# Patient Record
Sex: Male | Born: 1940
Health system: Southern US, Community
[De-identification: ages and names within clinical notes are randomized; demographics above are authoritative.]

## PROBLEM LIST (undated history)

## (undated) DIAGNOSIS — E119 Type 2 diabetes mellitus without complications: Secondary | ICD-10-CM

## (undated) DIAGNOSIS — N400 Enlarged prostate without lower urinary tract symptoms: Secondary | ICD-10-CM

## (undated) DIAGNOSIS — Z9889 Other specified postprocedural states: Secondary | ICD-10-CM

## (undated) DIAGNOSIS — R0600 Dyspnea, unspecified: Secondary | ICD-10-CM

## (undated) DIAGNOSIS — K2270 Barrett's esophagus without dysplasia: Secondary | ICD-10-CM

## (undated) DIAGNOSIS — I2510 Atherosclerotic heart disease of native coronary artery without angina pectoris: Secondary | ICD-10-CM

## (undated) DIAGNOSIS — Z87442 Personal history of urinary calculi: Secondary | ICD-10-CM

## (undated) DIAGNOSIS — I10 Essential (primary) hypertension: Secondary | ICD-10-CM

## (undated) DIAGNOSIS — I480 Paroxysmal atrial fibrillation: Secondary | ICD-10-CM

## (undated) DIAGNOSIS — K219 Gastro-esophageal reflux disease without esophagitis: Secondary | ICD-10-CM

## (undated) DIAGNOSIS — R42 Dizziness and giddiness: Secondary | ICD-10-CM

## (undated) DIAGNOSIS — K449 Diaphragmatic hernia without obstruction or gangrene: Secondary | ICD-10-CM

## (undated) HISTORY — DX: Atherosclerotic heart disease of native coronary artery without angina pectoris: I25.10

## (undated) HISTORY — DX: Type 2 diabetes mellitus without complications: E11.9

## (undated) HISTORY — DX: Other specified postprocedural states: Z98.890

## (undated) HISTORY — DX: Essential (primary) hypertension: I10

## (undated) HISTORY — DX: Diaphragmatic hernia without obstruction or gangrene: K44.9

## (undated) HISTORY — DX: Barrett's esophagus without dysplasia: K22.70

## (undated) HISTORY — DX: Gastro-esophageal reflux disease without esophagitis: K21.9

## (undated) HISTORY — DX: Benign prostatic hyperplasia without lower urinary tract symptoms: N40.0

## (undated) HISTORY — PX: ROTATOR CUFF REPAIR: SHX139

## (undated) HISTORY — DX: Paroxysmal atrial fibrillation: I48.0

## (undated) HISTORY — PX: CHOLECYSTECTOMY: SHX55

## (undated) HISTORY — DX: Dizziness and giddiness: R42

## (undated) HISTORY — PX: APPENDECTOMY: SHX54

## (undated) HISTORY — DX: Dyspnea, unspecified: R06.00

---

## 2001-06-06 ENCOUNTER — Ambulatory Visit (HOSPITAL_COMMUNITY): Admission: RE | Admit: 2001-06-06 | Discharge: 2001-06-06 | Payer: Self-pay | Admitting: Family Medicine

## 2001-06-06 ENCOUNTER — Encounter: Payer: Self-pay | Admitting: Family Medicine

## 2003-02-08 ENCOUNTER — Other Ambulatory Visit: Admission: RE | Admit: 2003-02-08 | Discharge: 2003-02-08 | Payer: Self-pay | Admitting: Dermatology

## 2003-03-12 ENCOUNTER — Ambulatory Visit (HOSPITAL_COMMUNITY): Admission: RE | Admit: 2003-03-12 | Discharge: 2003-03-12 | Payer: Self-pay | Admitting: Otolaryngology

## 2003-03-12 ENCOUNTER — Encounter: Payer: Self-pay | Admitting: Otolaryngology

## 2003-08-12 ENCOUNTER — Emergency Department (HOSPITAL_COMMUNITY): Admission: EM | Admit: 2003-08-12 | Discharge: 2003-08-13 | Payer: Self-pay | Admitting: Emergency Medicine

## 2003-08-13 ENCOUNTER — Encounter: Payer: Self-pay | Admitting: Emergency Medicine

## 2003-09-02 ENCOUNTER — Other Ambulatory Visit: Admission: RE | Admit: 2003-09-02 | Discharge: 2003-09-02 | Payer: Self-pay | Admitting: Dermatology

## 2003-09-30 ENCOUNTER — Ambulatory Visit (HOSPITAL_COMMUNITY): Admission: RE | Admit: 2003-09-30 | Discharge: 2003-09-30 | Payer: Self-pay | Admitting: Family Medicine

## 2003-10-04 ENCOUNTER — Ambulatory Visit (HOSPITAL_COMMUNITY): Admission: RE | Admit: 2003-10-04 | Discharge: 2003-10-04 | Payer: Self-pay | Admitting: Family Medicine

## 2003-10-08 ENCOUNTER — Ambulatory Visit (HOSPITAL_COMMUNITY): Admission: RE | Admit: 2003-10-08 | Discharge: 2003-10-08 | Payer: Self-pay | Admitting: Family Medicine

## 2003-12-20 ENCOUNTER — Other Ambulatory Visit: Admission: RE | Admit: 2003-12-20 | Discharge: 2003-12-20 | Payer: Self-pay | Admitting: Dermatology

## 2004-10-03 ENCOUNTER — Ambulatory Visit (HOSPITAL_COMMUNITY): Admission: RE | Admit: 2004-10-03 | Discharge: 2004-10-03 | Payer: Self-pay | Admitting: Family Medicine

## 2005-01-08 ENCOUNTER — Ambulatory Visit (HOSPITAL_COMMUNITY): Admission: RE | Admit: 2005-01-08 | Discharge: 2005-01-08 | Payer: Self-pay | Admitting: Internal Medicine

## 2005-11-19 DIAGNOSIS — Z9889 Other specified postprocedural states: Secondary | ICD-10-CM

## 2005-11-19 HISTORY — DX: Other specified postprocedural states: Z98.890

## 2006-01-09 ENCOUNTER — Ambulatory Visit (HOSPITAL_COMMUNITY): Admission: RE | Admit: 2006-01-09 | Discharge: 2006-01-09 | Payer: Self-pay | Admitting: Family Medicine

## 2006-02-05 ENCOUNTER — Ambulatory Visit: Payer: Self-pay | Admitting: Internal Medicine

## 2006-02-06 ENCOUNTER — Encounter (INDEPENDENT_AMBULATORY_CARE_PROVIDER_SITE_OTHER): Payer: Self-pay | Admitting: *Deleted

## 2006-02-06 ENCOUNTER — Ambulatory Visit: Payer: Self-pay | Admitting: Internal Medicine

## 2006-02-06 ENCOUNTER — Ambulatory Visit (HOSPITAL_COMMUNITY): Admission: RE | Admit: 2006-02-06 | Discharge: 2006-02-06 | Payer: Self-pay | Admitting: Internal Medicine

## 2006-02-06 HISTORY — PX: COLONOSCOPY: SHX174

## 2006-02-11 ENCOUNTER — Ambulatory Visit (HOSPITAL_COMMUNITY): Admission: RE | Admit: 2006-02-11 | Discharge: 2006-02-11 | Payer: Self-pay | Admitting: Internal Medicine

## 2006-04-04 ENCOUNTER — Ambulatory Visit: Payer: Self-pay | Admitting: Internal Medicine

## 2006-05-01 ENCOUNTER — Ambulatory Visit: Payer: Self-pay | Admitting: Internal Medicine

## 2006-12-24 ENCOUNTER — Ambulatory Visit: Payer: Self-pay | Admitting: Internal Medicine

## 2008-01-09 ENCOUNTER — Ambulatory Visit: Payer: Self-pay | Admitting: Internal Medicine

## 2008-03-31 ENCOUNTER — Ambulatory Visit (HOSPITAL_COMMUNITY): Admission: RE | Admit: 2008-03-31 | Discharge: 2008-03-31 | Payer: Self-pay | Admitting: Family Medicine

## 2008-12-06 ENCOUNTER — Ambulatory Visit (HOSPITAL_COMMUNITY): Admission: RE | Admit: 2008-12-06 | Discharge: 2008-12-06 | Payer: Self-pay | Admitting: Ophthalmology

## 2009-01-31 DIAGNOSIS — K2270 Barrett's esophagus without dysplasia: Secondary | ICD-10-CM

## 2009-01-31 DIAGNOSIS — K219 Gastro-esophageal reflux disease without esophagitis: Secondary | ICD-10-CM | POA: Insufficient documentation

## 2009-02-01 ENCOUNTER — Ambulatory Visit: Payer: Self-pay | Admitting: Internal Medicine

## 2009-02-03 ENCOUNTER — Encounter: Payer: Self-pay | Admitting: Internal Medicine

## 2009-04-19 ENCOUNTER — Encounter: Payer: Self-pay | Admitting: Gastroenterology

## 2010-02-09 ENCOUNTER — Encounter: Payer: Self-pay | Admitting: Gastroenterology

## 2010-05-01 ENCOUNTER — Emergency Department (HOSPITAL_COMMUNITY): Admission: EM | Admit: 2010-05-01 | Discharge: 2010-05-01 | Payer: Self-pay | Admitting: Emergency Medicine

## 2010-05-09 ENCOUNTER — Ambulatory Visit: Payer: Self-pay | Admitting: Cardiovascular Disease

## 2010-05-09 DIAGNOSIS — R079 Chest pain, unspecified: Secondary | ICD-10-CM

## 2010-05-09 DIAGNOSIS — R42 Dizziness and giddiness: Secondary | ICD-10-CM | POA: Insufficient documentation

## 2010-05-17 ENCOUNTER — Ambulatory Visit: Payer: Self-pay | Admitting: Cardiology

## 2010-05-17 ENCOUNTER — Encounter (HOSPITAL_COMMUNITY): Admission: RE | Admit: 2010-05-17 | Discharge: 2010-05-17 | Payer: Self-pay | Admitting: Cardiovascular Disease

## 2010-05-24 ENCOUNTER — Encounter: Payer: Self-pay | Admitting: Cardiovascular Disease

## 2010-08-19 DIAGNOSIS — Z9889 Other specified postprocedural states: Secondary | ICD-10-CM

## 2010-08-19 HISTORY — DX: Other specified postprocedural states: Z98.890

## 2010-08-25 ENCOUNTER — Ambulatory Visit: Payer: Self-pay | Admitting: Internal Medicine

## 2010-09-15 ENCOUNTER — Ambulatory Visit: Payer: Self-pay | Admitting: Internal Medicine

## 2010-09-15 ENCOUNTER — Ambulatory Visit (HOSPITAL_COMMUNITY): Admission: RE | Admit: 2010-09-15 | Discharge: 2010-09-15 | Payer: Self-pay | Admitting: Internal Medicine

## 2010-09-15 HISTORY — PX: ESOPHAGOGASTRODUODENOSCOPY: SHX1529

## 2010-09-25 ENCOUNTER — Encounter: Payer: Self-pay | Admitting: Internal Medicine

## 2010-10-16 ENCOUNTER — Encounter: Payer: Self-pay | Admitting: Urgent Care

## 2010-12-05 ENCOUNTER — Encounter: Payer: Self-pay | Admitting: Urgent Care

## 2010-12-19 NOTE — Letter (Signed)
Summary: [place] Treadmill (Nuc Med Stress)  [facility]  [address], [place] [address]   Phone: [phone]  Fax: [phone]    Nuclear Medicine 1-Day Stress Test Information Sheet  Re:     [name]   DOB:     [birth year] MRN:     [id number] Weight:  Appointment Date: Register at: Appointment Time: Referring MD:  ___Exercise Stress  __Adenosine   __Dobutamine  _X_Lexiscan  __Persantine   __Thallium  Urgency: ____1 (next day)   ____2 (one week)    ____3 (PRN)  Patient will receive Follow Up call with results: Patient needs follow-up appointment:  Instructions regarding medication:  How to prepare for your stress test: 1. DO NOT eat or dring 6 hours prior to your arrival time. This includes no caffeine (coffee, tea, sodas, chocolate) if you were instructed to take your medications, drink water with it. 2. DO NOT use any tobacco products for at leaset 8 hours prior to arrival. 3. DO NOT wear dresses or any clothing that may have metal clasps or buttons. 4. Wear short sleeve shirts, loose clothing, and comfortalbe walking shoes. 5. DO NOT use lotions, oils or powder on your chest before the test. 6. The test will take approximately 3-4 hours from the time you arrive until completion. 7. To register the day of the test, go to the Short Stay entrance at [facility]. 8. If you must cancel your test, call [phone] as soon as you are aware.  After you arrive for test:   When you arrive at [facility], you will go to Short Stay to be registered. They will then send you to Radiology to check in. The Nuclear Medicine Tech will get you and start an IV in your arm or hand. A small amount of a radioactive tracer will then be injected into your IV. This tracer will then have to circulate for 30-45 minutes. During this time you will wait in the waiting room and you will be able to drink something without caffeine. A series of pictures will be taken  of your heart follwoing this waiting period. After the 1st set of pictures you will go to the stress lab to get ready for your stress test. During the stress test, another small amount of a radioactive tracer will be injected through your IV. When the stress test is complete, there is a short rest period while your heart rate and blood pressure will be monitored. When this monitoring period is complete you will have another set of pictrues taken. (The same as the 1st set of pictures). These pictures are taken between 15 minutes and 1 hour after the stress test. The time depends on the type of stress test you had. Your doctor will inform you of your test results within 7 days after test.    The possibilities of certain changes are possible during the test. They include abnormal blood pressure and disorders of the heart. Side effects of persantine or adenosine can include flushing, chest pain, shortness of breath, stomach tightness, headache and light-headedness. These side effects usually do not last long and are self-resolving. Every effort will be made to keep you comfortable and to minimize complications by obtaining a medical history and by close observation during the test. Emergency equipment, medications, and trained personnel are available to deal with any unusual situation which may arise.  Please notify office at least 48 hours in advance if you are unable to keep  this appt.

## 2010-12-19 NOTE — Letter (Signed)
Summary: EGD ORDER  EGD ORDER   Imported By: Ave Filter 08/25/2010 09:48:53  _____________________________________________________________________  External Attachment:    Type:   Image     Comment:   External Document

## 2010-12-19 NOTE — Assessment & Plan Note (Signed)
Summary: POST ED VISIT ON 05/01/10/TG   Visit Type:  Follow-up Primary Provider:  Karleen Hampshire  CC:  no cardiology complaints.  History of Present Illness: [name] is seen today at the request of the [facility] ER Dr Adriana Simas for chest pain evaluated on 05/01/10.  Records and ECG reviewed.  Pain is atypical centering around left neck and shoulder.  He has seen Dr. Rennis Chris before for right rotator cuff surgery and needs left shoulder surgery.  The pain is associated with dizzyness that may be vagally mediated with no frank syncope.  He has no documented CAD and had a normal stress test "many" years ago.  He has had dizzy spells on Hytrin before and Dr Earlene Plater decreased his dose of Flomax recently but the medicine is not new.  He indicates that he does walking and yard work without pain.  However movement of the arms and neck does not make the pain worse  In ER ECG was normal, enzymes negative CXR normal.    Current Problems (verified): 1)  Chest Pain  (ICD-786.50) 2)  Gerd  (ICD-530.81) 3)  Barretts Esophagus  (ICD-530.85)  Current Medications (verified): 1)  Allegra 180 Mg Tabs (Fexofenadine Hcl) .... Take 1 Tablet By Mouth Once A Day 2)  Flomax 0.4 Mg Xr24h-Cap (Tamsulosin Hcl) .... Take 1 Tablet By Mouth Once A Day 3)  Tylenol .... As Needed 4)  Meclizine .... As Needed 5)  Pantoprazole Sodium 40 Mg Tbec (Pantoprazole Sodium) .... One By Mouth Daily  Allergies (verified): No Known Drug Allergies  Past History:  Past Medical History: Last updated: 02/14/09 Barretts Esophagus (short-segment), diagnosed in 2007 by Dr. Jena Gauss GERD, chronic Hypertension BPH Allergies Vertigo Colonoscopy in 2007 by Dr. Jena Gauss - unremarkable  Past Surgical History: Last updated: 02/14/2009 Appendectomy Cholecystectomy - 1997 Dr. Katrinka Blazing Rotator Cuff Repair-Right - 2006  Family History: Last updated: 2009-02-14 Father: Deceased, age 16, prostate cancer Mother: Deceased, CVA Siblings:  No FH of Colon  Cancer:  Social History: Last updated: 02/14/09 Marital Status: Married Children: Son Occupation: Retired from [employer], does yard work part-time Patient is a former smoker. Quit 15 years ago, 60 pack year history Alcohol Use - no Illicit Drug Use - no  Review of Systems       Denies fever, malais, weight loss, blurry vision, decreased visual acuity, cough, sputum, SOB, hemoptysis, pleuritic pain, palpitaitons, heartburn, abdominal pain, melena, lower extremity edema, claudication, or rash.   Vital Signs:  Patient profile:   70 year old male Weight:      209 pounds BMI:     29.25 Pulse rate:   65 / minute BP sitting:   107 / 65  (right arm)  Vitals Entered By: Dreama Saa, CNA (May 09, 2010 10:16 AM)  Physical Exam  General:  Affect appropriate Healthy:  appears stated age HEENT: normal Neck supple with no adenopathy JVP normal no bruits no thyromegaly Lungs clear with no wheezing and good diaphragmatic motion Heart:  S1/S2 no murmur,rub, gallop or click PMI normal Abdomen: benighn, BS positve, no tenderness, no AAA no bruit.  No HSM or HJR Distal pulses intact with no bruits No edema Neuro non-focal Skin warm and dry    Impression & Recommendations:  Problem # 1:  CHEST PAIN (ICD-786.50) Atypical with limited risk factors and normal ECG and normal ER evaluaiotn.  Lexiscan Myovue.  Unable to walk due to hip, knee pain and arthritis Orders: Nuclear Stress Test (Nuc Stress Test)  Problem # 2:  DIZZINESS (ICD-780.4) May be vagal related to pain.  Decreasing Flomax dose is appropriate.  Will see how clincal course goes and what Myovue looks like  Patient Instructions: 1)  Your physician recommends that you schedule a follow-up appointment in:  2)  Your physician recommends that you continue on your current medications as directed. Please refer to the Current Medication list given to you today. 3)  Your physician has requested that you have an  [employer]  For further information please visit [email or web address]  Please follow instruction sheet, as given.   EKG Report  Procedure date:  05/01/2010  Findings:      NSR 58 Normal ECG  Appended Document: f/u prn  Pt. to f/u as needed

## 2010-12-19 NOTE — Medication Information (Signed)
Summary: PROTONIX 40MG   PROTONIX 40MG    Imported By: Rexene Alberts 10/16/2010 10:29:35  _____________________________________________________________________  External Attachment:    Type:   Image     Comment:   External Document  Appended Document: PROTONIX 40MG     Prescriptions: PANTOPRAZOLE SODIUM 40 MG TBEC (PANTOPRAZOLE SODIUM) one by mouth two times a day for acid reflux  #62 x 5   Entered and Authorized by:   Joselyn Arrow FNP-BC   Signed by:   Joselyn Arrow FNP-BC on 10/16/2010   Method used:   Electronically to        [facility] (retail)       [address]       [place], [place]  [address]       Ph: [phone]       Fax: [phone]   RxID:   [id number]     Appended Document: PROTONIX 40MG  Rx was sent to [facility] pharmacy and should have been sent to [facility] pharmacy.  I called it to [facility] [place], pharmacy. I called Robbie Lis and spoke with [facility] and cancelled it.  Appended Document: PROTONIX 40MG  Thanks

## 2010-12-19 NOTE — Letter (Signed)
Summary: [name], agricultural at [facility]  [address], [place] [address]   Phone: [phone]  Fax: [phone]      May 24, 2010 MRN: [id number]   [name] [address] [place], [place]  [address]   Dear Mr. Totino,  Your test ordered by Selena Batten has been reviewed by your physician (or physician assistant) and was found to be normal or stable. Your physician (or physician assistant) felt no changes were needed at this time.  ____ Echocardiogram  __X__ Cardiac Stress Test  ____ Lab Work  ____ Peripheral vascular study of arms, legs or neck  ____ CT scan or X-ray  ____ Lung or Breathing test  ____ Other: Please continue on current medical treatment.  Thank you.   Charlton Haws, MD, F.A.C.C

## 2010-12-19 NOTE — Medication Information (Signed)
Summary: Tax adviser   Imported By: Ricard Dillon 02/09/2010 13:54:27  _____________________________________________________________________  External Attachment:    Type:   Image     Comment:   External Document  Appended Document: RX Folder - pantoprazole    Prescriptions: PANTOPRAZOLE SODIUM 40 MG TBEC (PANTOPRAZOLE SODIUM) one by mouth daily  #31 x 11   Entered and Authorized by:   Leanna Battles. Dixon Boos   Signed by:   Leanna Battles Dixon Boos on 02/10/2010   Method used:   Electronically to        [facility] (retail)       [address]       [place], [place]  [address]       Ph: [phone] or [phone]       Fax: [phone]   RxID:   [id number]

## 2010-12-19 NOTE — Assessment & Plan Note (Signed)
Summary: STOMACH PAIN/SS   Visit Type:  Follow-up Visit Primary Care Provider:  mcgough  Chief Complaint:  follow up- overdue for egd and no problems right now.  History of Present Illness: History of GERD and short segment Barrett's esophagus. He returns for followup. He's had some intermittent chest tightness associated with exertion. He seen Dr. Eden Emms .  Myoview recently negative. Not felt to have ischemic heart disease. No dysphagia. No melena or rectal bleeding. On pantoprazole 40 mg orally daily with  significant control her reflux although he does have breakthrough symptoms from time to time. We saw him last year and offered  him a surveillance EGD, however, he declined. However, he presents now to have that done.  Current Problems (verified): 1)  Dizziness  (ICD-780.4) 2)  Chest Pain  (ICD-786.50) 3)  Gerd  (ICD-530.81) 4)  Barretts Esophagus  (ICD-530.85)  Current Medications (verified): 1)  Allegra 180 Mg Tabs (Fexofenadine Hcl) .... Take 1 Tablet By Mouth Once A Day 2)  Flomax 0.4 Mg Xr24h-Cap (Tamsulosin Hcl) .... Take 1 Tablet By Mouth Once A Day 3)  Tylenol .... As Needed 4)  Meclizine .... As Needed 5)  Pantoprazole Sodium 40 Mg Tbec (Pantoprazole Sodium) .... One By Mouth Daily  Allergies (verified): No Known Drug Allergies  Past History:  Past Surgical History: Last updated: 2009/02/11 Appendectomy Cholecystectomy - 1997 Dr. Katrinka Blazing Rotator Cuff Repair-Right - 2006  Family History: Last updated: 02-11-09 Father: Deceased, age 81, prostate cancer Mother: Deceased, CVA Siblings:  No FH of Colon Cancer:  Social History: Last updated: 02-11-09 Marital Status: Married Children: Son Occupation: Retired from [employer], does yard work part-time Patient is a former smoker. Quit 15 years ago, 60 pack year history Alcohol Use - no Illicit Drug Use - no  Risk Factors: Smoking Status: quit (02/11/2009)  Past Medical History: Barretts  Esophagus (short-segment), diagnosed in 2007 by Dr. Jena Gauss GERD, chronic Hypertension BPH Allergies Vertigo Colonoscopy in 2007 by Dr. Jena Gauss - unremarkable prostate problems  Review of Systems       patient denies abdominal pain chest pain dyspnea on exertion no fever chills no yellow jaundice clinically she'll dark-colored urine. He denies constipation diarrhea he denies fever chills  Vital Signs:  Patient profile:   70 year old male Height:      71 inches Weight:      210 pounds BMI:     29.39 Temp:     97.6 degrees F oral Pulse rate:   60 / minute BP sitting:   138 / 90  (left arm) Cuff size:   regular  Vitals Entered By: Hendricks Limes LPN (August 25, 2010 9:05 AM)  Physical Exam  General:  pleasant alert gentleman no acute distress Eyes:  no sclerae. Conjunctivae are pink Lungs:  clear to auscultation Heart:  regular rate and rhythm without murmur gallop or a Abdomen:  flat positive bowel sounds soft nontender without appreciable mass or organomegaly  Impression & Recommendations: Impression: A 70 year old gentleman with GERD and known short segment Barrett's esophagus. He has occasional atypical chest symptoms. Symptoms not felt to be cardiac in origin. He certainly has reflux helped pantoprazole 40 mg orally daily. He somewhat overdue for surveillance EGD.  Recommendations: Surveillance EGD in the near future. Risks, benefits, limitations alternatives and upon which have been reviewed. His questions were answered;  prt agreeable. Between now and the time of EGD, i've asked this nice gentleman to increase his pantoprazole to 40 mg orally twice daily.  Routine screening colonoscopy 2017.  Further recommendations to follow.  Appended Document: Orders Update    Clinical Lists Changes  Orders: Added new Service order of Est. Patient Level III ([address]) - Signed

## 2010-12-19 NOTE — Letter (Signed)
Summary: Patient Notice, Endo Biopsy Results  [facility]  [address]   [place], [place] [address]   Phone: [phone]  Fax: [phone]       September 25, 2010   Harrington Dolloff [address] [place], [place]  [address] Mar 25, 1941    Dear Mr. Gunnarson,  I am pleased to inform you that the biopsies taken during your recent endoscopic examination did not show any evidence of cancer upon pathologic examination.  Biopsies did confirm Barrett's esophagus.  There was only mild inflammation in your stomach.  Additional information/recommendations:  Continue with the treatment plan as outlined on the day of your exam.  You should have a repeat endoscopic examination in 3 years.  Please call us if you are having persistent problems or have questions about your condition that have not been fully answered at this time.  Sincerely,    R. Roetta Sessions MD, FACP [facility] Ph: [phone]   Fax: [phone]   Appended Document: Patient Notice, Endo Biopsy Results letter mailed to pt  Appended Document: Patient Notice, Endo Biopsy Results reminder in computer

## 2010-12-21 NOTE — Medication Information (Signed)
Summary: PANTOPRAZOLE SODIUM 40MG   PANTOPRAZOLE SODIUM 40MG    Imported By: Rexene Alberts 12/05/2010 10:26:30  _____________________________________________________________________  External Attachment:    Type:   Image     Comment:   External Document  Appended Document: PANTOPRAZOLE SODIUM 40MG  5 RF given 11/11.  Pt should have some left @ [facility]  Appended Document: PANTOPRAZOLE SODIUM 40MG  informed pharmacy

## 2010-12-25 ENCOUNTER — Other Ambulatory Visit (HOSPITAL_COMMUNITY): Payer: Self-pay | Admitting: Family Medicine

## 2010-12-25 ENCOUNTER — Ambulatory Visit (HOSPITAL_COMMUNITY)
Admission: RE | Admit: 2010-12-25 | Discharge: 2010-12-25 | Disposition: A | Discharge status: Home / Self Care | Payer: Medicare Other | Source: Ambulatory Visit | Attending: Family Medicine | Admitting: Family Medicine

## 2010-12-25 DIAGNOSIS — R002 Palpitations: Secondary | ICD-10-CM | POA: Insufficient documentation

## 2010-12-25 DIAGNOSIS — R059 Cough, unspecified: Secondary | ICD-10-CM | POA: Insufficient documentation

## 2010-12-25 DIAGNOSIS — R05 Cough: Secondary | ICD-10-CM | POA: Insufficient documentation

## 2011-01-12 ENCOUNTER — Ambulatory Visit (INDEPENDENT_AMBULATORY_CARE_PROVIDER_SITE_OTHER): Payer: Medicare Other | Admitting: Urology

## 2011-01-12 DIAGNOSIS — N509 Disorder of male genital organs, unspecified: Secondary | ICD-10-CM

## 2011-01-12 DIAGNOSIS — N401 Enlarged prostate with lower urinary tract symptoms: Secondary | ICD-10-CM

## 2011-01-12 DIAGNOSIS — N411 Chronic prostatitis: Secondary | ICD-10-CM

## 2011-01-12 DIAGNOSIS — R972 Elevated prostate specific antigen [PSA]: Secondary | ICD-10-CM

## 2011-02-05 LAB — BASIC METABOLIC PANEL
BUN: 18 mg/dL (ref 6–23)
CO2: 24 mEq/L (ref 19–32)
Calcium: 9.1 mg/dL (ref 8.4–10.5)
Creatinine, Ser: 0.93 mg/dL (ref 0.4–1.5)
GFR calc non Af Amer: >60 mL/min (ref >60)
Glucose, Bld: 153 mg/dL — ABNORMAL HIGH (ref 70–99)
Sodium: 139 mEq/L (ref 135–145)

## 2011-02-05 LAB — DIFFERENTIAL
Basophils Absolute: 0 10*3/uL (ref 0.0–0.1)
Basophils Relative: 1 % (ref 0–1)
Lymphocytes Relative: 16 % (ref 12–46)
Monocytes Absolute: 0.4 10*3/uL (ref 0.1–1.0)
Neutro Abs: 4.5 10*3/uL (ref 1.7–7.7)

## 2011-02-05 LAB — D-DIMER, QUANTITATIVE: D-Dimer, Quant: 0.22 ug/mL-FEU (ref 0.00–0.48)

## 2011-02-05 LAB — CBC
Hemoglobin: 14.9 g/dL (ref 13.0–17.0)
MCHC: 34.7 g/dL (ref 30.0–36.0)
Platelets: 148 10*3/uL — ABNORMAL LOW (ref 150–400)
RDW: 13.5 % (ref 11.5–15.5)

## 2011-02-05 LAB — POCT CARDIAC MARKERS: Myoglobin, poc: 82 ng/mL (ref 12–200)

## 2011-03-05 LAB — BASIC METABOLIC PANEL
BUN: 14 mg/dL (ref 6–23)
CO2: 27 mEq/L (ref 19–32)
Calcium: 8.9 mg/dL (ref 8.4–10.5)
Glucose, Bld: 128 mg/dL — ABNORMAL HIGH (ref 70–99)
Potassium: 3.7 mEq/L (ref 3.5–5.1)
Sodium: 138 mEq/L (ref 135–145)

## 2011-03-22 ENCOUNTER — Encounter: Payer: Self-pay | Admitting: Internal Medicine

## 2011-04-03 NOTE — Assessment & Plan Note (Signed)
NAMEJAMMAL, [name]                 CHART#:  [id number]   DATE:  01/09/2008                       DOB:  06/12/1941   HISTORY OF PRESENT ILLNESS:  Followup gastroesophageal reflux disease,  short segment biopsy, and premature segment Barrett's esophagus. Last  seen December 24, 2006. Overall, he has done very well on Nexium 40 mg  daily (he was doing well on Prevacid but [place] asked [place] to consider  Nexium.) He is not having any dysphagia. Overall, he is doing well now.  He is on Medic here and wants a generic equivalent. He tells me he is  into doing it at home. He is no longer taking Reglan, which he was  taking previously. Last EGD was in 2007. He is due for surveillance in  2010. Last had colonoscopy in 2007 and would be due for routine  screening in 2017.   The only problem [name] has really been having recently is vertigo.  He does take Meclizine p.r.n. He has not seen [facility] folks  recently for that problem.   CURRENT MEDICATIONS:  See updated list.   ALLERGIES:  NO KNOWN DRUG ALLERGIES.   PHYSICAL EXAMINATION:  GENERAL:  A 70 year old gentleman in no acute  distress.  VITAL SIGNS:  Weight 215, height 5 foot 10.  Temperature 97.8. Blood  pressure 130/88, pulse 64.  SKIN:  Warm and dry.  CHEST:  Lungs clear to auscultation.  CARDIOVASCULAR:  Regular rate and rhythm. Without murmur, rub, or  gallop.  ABDOMEN:  Nondistended. Positive bowel sounds. Soft, nontender, without  appreciable mass or organomegaly.   ASSESSMENT:  Reflux symptoms, well controlled on Nexium. He would like a  generic equivalent. Will check with his pharmacy as far as benefit goes  for either pantoprazole or Omeprazole. Will go with whatever is the  cheapest for a month and he will let us know how he likes it. If he  cannot tell any difference, we will go with that agent and if it is not  as good, we  might try the other one adn go from there. Unless something comes up,  plan to see this  nice gentleman back in 1 year.       Jonathon Bellows, M.D.  Electronically Signed     RMR/MEDQ  D:  01/09/2008  T:  01/09/2008  Job:  [id number]   cc:   Kirk Ruths, M.D.

## 2011-04-06 NOTE — Consult Note (Signed)
NAME:  [name], [name]                    ACCOUNT NO.:  0   MEDICAL RECORD NO.:  [id number]           PATIENT TYPE:   LOCATION:                                 FACILITY:   PHYSICIAN:  R. Roetta Sessions, M.D. DATE OF BIRTH:  08-Jan-1941   DATE OF CONSULTATION:  02/05/2006  DATE OF DISCHARGE:                                   CONSULTATION   REFERRING PHYSICIAN:  Kirk Ruths, M.D.   REASON FOR CONSULTATION:  Severe epigastric pain x2 weeks.   HISTORY OF PRESENT ILLNESS:  [name] is a 70 year old, Caucasian male who  reports about 3 weeks ago he had two to three episodes of large volume, dark  stools which he felt to be melena.  He did afterwards complete three  Hemoccult cards which were reportedly negative.  He began to have severe  epigastric pain.  This has been fairly constant for the last 2 weeks.  Initially, the pain was 10/10 on pain scale.  He started taking Prilosec  once daily about 1 month ago.  Within the last week, he has taken it b.i.d.  This has helped his symptoms about 75%.  He has also cut out coffee and  fried foods from his diet.  He does note that bland foods make his symptoms  resolve at least for a moment of time.  He denies any nausea or vomiting.  He denies any dysphagia, odynophagia or regurgitation.  He has a long-  standing history of chronic GERD with nocturnal heartburn and water brash  for at least the last 5 years.  He has also noticed abdominal bloating and  increased belching.  He generally has a bowel movement once or twice a day.  He denies any further melena or rectal bleeding.   PAST MEDICAL HISTORY:  1.  Hypertension.  2.  GERD.  3.  BPH.  4.  Allergies.  5.  Appendectomy.  6.  Right rotator cuff repair in December 2006.  7.  Laparoscopic cholecystectomy in 1997, by Dr. Katrinka Blazing.   CURRENT MEDICATIONS:  1.  Allegra 180 mg daily.  2.  Flomax 0.4 mg daily.  3.  Prilosec 20 mg b.i.d.  4.  Tylenol p.r.n.   ALLERGIES:  No known drug  allergies.   FAMILY HISTORY:  No known family history of colorectal carcinoma or chronic  GI problems.  Mother deceased due to CVA.  Father deceased at age 76  secondary to prostate carcinoma.  He has one healthy brother.   SOCIAL HISTORY:  [name] has been in his second marriage x30 years.  He  has one son.  He is retired from the [employer].  He has a  IT sales professional.  He has a 60-pack-year tobacco use history  quitting 12 years ago.  Denies any alcohol or drug use.   REVIEW OF SYSTEMS:  CONSTITUTIONAL:  Weight is stable.  He does complain of  anorexia as he is afraid to eat.  Denies any fever or chills.  Complains  of some fatigue.  CARDIOVASCULAR:  Denies  any chest pain or palpitations.  PULMONARY:  Denies any shortness of breath, dyspnea, cough or hemoptysis.  GASTROINTESTINAL:  See HPI.   PHYSICAL EXAMINATION:  VITAL SIGNS:  Weight 213 pounds, height 68 inches,  temperature 98.3, blood pressure 122/70, pulse 94.  GENERAL:  [name] is a 70 year old, Caucasian male who is alert, pleasant,  oriented and cooperative in no acute distress.  HEENT:  Sclerae clear, nonicteric.  Conjunctivae pink.  Oropharynx pink and  moist without any lesions.  NECK:  Supple without mass or thyromegaly.  CHEST:  Heart regular rate and rhythm with normal S1, S2.  No murmurs, rubs  or gallops.  LUNGS:  Clear to auscultation bilaterally.  ABDOMEN:  Positive bowel sounds x4.  No bruits auscultated.  Nondistended.  He does have mild tenderness to epigastrium and bilateral lower quadrants on  deep palpation.  No rebound tenderness or guarding.  No hepatosplenomegaly  or mass.  RECTAL:  He does have a 1.5 cm to 2 cm, circumferential erythema to the  perirectal skin.  No evidence of hemorrhoids.  Internal exam is normal.  He  has a small amount of light brown stool which is Hemoccult negative.  EXTREMITIES:  Without clubbing or edema bilaterally.  SKIN:  Pink, warm and dry  without rash or jaundice.   ASSESSMENT:  [name] is a 70 year old, Caucasian male with a long-standing  history of chronic gastroesophageal reflux disease with symptoms including  nocturnal heartburn, indigestion and water brash.  Approximately 3 weeks  ago, he had three episodes of large volume dark stools.  He presented to his  primary care and apparently stools were Hemoccult negative.  At the same  time, he developed severe epigastric pain.  He is now taking two times a day  Prilosec which has resolved his symptoms about 75%.  He is going to need  further evaluation to assess his upper gastrointestinal tract, rule out  peptic ulcer disease.  He also needs colonoscopy to rule out diverticular  bleeding, etc.   RECOMMENDATIONS:  1.  Will check diverticular bleeding, colorectal carcinoma, etc.  He is      status post cholecystectomy.  I suspect most of his symptoms are due to      refractory GERD, erosive reflux esophagitis.  2.  Continue Prilosec 20 mg b.i.d.  3.  Check CBC, LFTs, amylase and lipase.  4.  I have offered pain medicines, however, he has declined.  5.  Will schedule EGD and colonoscopy in the near future.  I have discussed      both procedures including risks and benefits including, but not limited      to, bleeding, infection, perforation, drug reaction and consent will be      obtained.  6.  Further recommendations pending procedure.   I would like to thank Dr. Regino Schultze for allowing [place] to take part in the care  of this patient.      Nicholas Lose, N.P.      Jonathon Bellows, M.D.  Electronically Signed    KC/MEDQ  D:  02/05/2006  T:  02/05/2006  Job:  [id number]

## 2011-04-06 NOTE — Op Note (Signed)
[name], [name]                ACCOUNT NO.:  [id number]   MEDICAL RECORD NO.:  [id number]          PATIENT TYPE:  AMB   LOCATION:  DAY                           FACILITY:  [facility]   PHYSICIAN:  R. Roetta Sessions, M.D. DATE OF BIRTH:  May 24, 1941   DATE OF PROCEDURE:  02/06/2006  DATE OF DISCHARGE:                                 OPERATIVE REPORT   PROCEDURE:  Esophagogastroduodenoscopy with biopsy followed by colonoscopy,  screening.   INDICATIONS FOR PROCEDURE:  A 70 year old gentleman with chronic  gastroesophageal reflux disease. Had dark stools three weeks ago. Had some  epigastric pain. These symptoms have subsided. He is status post  cholecystectomy. He has not had any hematochezia. He has never had screening  colonoscopy. He significantly improved on b.i.d. Prilosec. Labs through the  office included a CBC, LFTs, amylase and lipase; all came back okay. EGD and  colonoscopy are now being done. This approach has been discussed with the  patient at length. Potential risks, benefits, and alternatives have been  reviewed and questions answered. He is agreeable. Please see documentation  in the medical record.   PROCEDURE NOTE:  O2 saturation, blood pressure, pulse, and respirations were  monitored throughout the entire procedure. Conscious sedation with Versed 8  mg IV and Demerol 150 mg IV in divided doses.   INSTRUMENT:  Olympus video chip system.   FINDINGS:  Esophagogastroduodenoscopy:  Examination of the tubular esophagus  revealed a patulous EG junction, a 3-cm patch of salmon-colored epithelium  coming up above the EG junction. Esophageal mucosa otherwise appeared  normal. There was no esophagitis. EG junction was easily traversed.   Stomach:  Gastric cavity was empty and insufflated well with air. There was  quite a bit of bile-stained mucus, otherwise. Thorough examination of  gastric mucosa including retroflexed view of the proximal stomach and  esophagogastric  junction demonstrated only a small hiatal hernia. Pylorus  patent and easily traversed. Examination of bulb and second portion revealed  no abnormalities.   THERAPEUTIC/DIAGNOSTIC MANEUVERS:  The tongue of salmon-colored epithelium  was biopsied for histological study. The patient tolerated the procedure  well and was prepared for colonoscopy. Digital rectal revealed no  abnormalities.   ENDOSCOPIC FINDINGS:  Prep was fair.   Rectum:  Examination of the rectal mucosa including retroflexed view of the  anal verge revealed only a single anal papilla.   Colon:  Colonic mucosa was surveyed from the rectosigmoid junction through  the left, transverse, and right colon to the area of the appendiceal  orifice, ileocecal valve, and cecum. These structures were well seen and  photographed for the record. From this level, the scope was slowly  withdrawn, and all previously mentioned mucosal surfaces were again seen.  The colonic mucosa appeared normal. The patient tolerated both procedures  well and was reactive to endoscopy.   IMPRESSION:  Esophagogastroduodenoscopy:  A 3-cm patch of salmon-colored  epithelium extended up from the EG junction suspicious for short-segment  Barrett's, biopsied. Patulous EG junction. Small hiatal hernia. Otherwise  normal stomach, normal D1 and D2.   Colonoscopy findings:  Single  anal papilla. Otherwise normal rectum. Normal  colon.   RECOMMENDATIONS:  1.  Stop Prilosec. Begin Prevacid 30 mg orally daily 30 minutes before      breakfast. Follow up on pathology.  2.  Further recommendations to follow.      Jonathon Bellows, M.D.  Electronically Signed     RMR/MEDQ  D:  02/06/2006  T:  02/07/2006  Job:  [id number]   cc:   Kirk Ruths, M.D.  Fax: [phone]

## 2011-04-20 ENCOUNTER — Ambulatory Visit (INDEPENDENT_AMBULATORY_CARE_PROVIDER_SITE_OTHER): Payer: Medicare Other | Admitting: Urology

## 2011-04-20 DIAGNOSIS — N509 Disorder of male genital organs, unspecified: Secondary | ICD-10-CM

## 2011-04-20 DIAGNOSIS — N401 Enlarged prostate with lower urinary tract symptoms: Secondary | ICD-10-CM

## 2011-05-14 ENCOUNTER — Other Ambulatory Visit: Payer: Self-pay

## 2011-05-14 MED ORDER — PANTOPRAZOLE SODIUM 40 MG PO TBEC: 40.0000 mg | DELAYED_RELEASE_TABLET | Freq: Two times a day (BID) | ORAL | Status: DC

## 2011-05-14 NOTE — Telephone Encounter (Signed)
Please schedule pt ov (for refills)

## 2011-05-14 NOTE — Telephone Encounter (Signed)
Needs OV with [place] if not already scheduled.

## 2011-05-14 NOTE — Telephone Encounter (Signed)
[facility] changed quantity to 62

## 2011-05-15 ENCOUNTER — Encounter: Payer: Self-pay | Admitting: Internal Medicine

## 2011-05-15 NOTE — Telephone Encounter (Signed)
Pt is aware of OV for 05/31/11 @ 0800 with [facility]

## 2011-05-25 ENCOUNTER — Ambulatory Visit: Payer: Medicare Other | Admitting: Urology

## 2011-05-31 ENCOUNTER — Encounter: Payer: Self-pay | Admitting: Gastroenterology

## 2011-05-31 ENCOUNTER — Ambulatory Visit (INDEPENDENT_AMBULATORY_CARE_PROVIDER_SITE_OTHER): Payer: Medicare Other | Admitting: Gastroenterology

## 2011-05-31 VITALS — BP 122/71 | HR 55 | Temp 97.6°F | Ht 71.0 in | Wt 211.6 lb

## 2011-05-31 DIAGNOSIS — K2270 Barrett's esophagus without dysplasia: Secondary | ICD-10-CM

## 2011-05-31 MED ORDER — PANTOPRAZOLE SODIUM 40 MG PO TBEC: 40.0000 mg | DELAYED_RELEASE_TABLET | Freq: Every day | ORAL | Status: DC

## 2011-05-31 NOTE — Assessment & Plan Note (Signed)
70 year old Caucasian male with hx of short segment Barrett's esophagus. Last surveillance in Oct 2011. Doing quite well now, maintained on Protonix once daily. No pain, N/V. No dysphagia. We will see this nice gentleman in 1 year. Refills for Protonix provided for 1 year as well. Next surveillance EGD in Oct 2014.

## 2011-05-31 NOTE — Patient Instructions (Signed)
Continue Protonix daily. Refills for one year have been sent to your pharmacy.  You will need another upper endoscopy in 2014.   We will see you back in one year.   Please call us if you have any problems in the interim.

## 2011-05-31 NOTE — Progress Notes (Signed)
Cc to PCP 

## 2011-05-31 NOTE — Progress Notes (Signed)
Referring Provider: No ref. provider found Primary Care Physician:  Kirk Ruths, MD Primary Gastroenterologist: Dr. Jena Gauss   Chief Complaint  Patient presents with  . Medication Refill    HPI:   [name] is a 70 year old pleasant Caucasian male who presents with hx of short segment Barrett's esophagus. Last surveillance EGD in Oct 2011; due for updated surveillance in 2014. Doing well at this time. He is here for further refills. He remains on Protonix once daily without any exacerbation of reflux. Denies dysphagia. No N/V. Denies lack of appetite or wt loss. Last colonoscopy unremarkable in 2007. No melena or brbpr.    Past Medical History  Diagnosis Date  . Barrett's esophagus     short-segment, diagnosed in 2007 by Dr. Jena Gauss  . GERD (gastroesophageal reflux disease)   . HTN (hypertension)   . BPH (benign prostatic hyperplasia)   . Vertigo   . S/P colonoscopy 2007    unremarkable  . S/P endoscopy Oct 2011     Salmon-colored epithelium coming up to 37 cm from the     Past Surgical History  Procedure Date  . Appendectomy   . Cholecystectomy   . Rotator cuff repair     Current Outpatient Prescriptions  Medication Sig Dispense Refill  . pantoprazole (PROTONIX) 40 MG tablet Take 1 tablet (40 mg total) by mouth daily.  31 tablet  11  . Tamsulosin HCl (FLOMAX) 0.4 MG CAPS         Allergies as of 05/31/2011  . (No Known Allergies)    Family History  Problem Relation Age of Onset  . Prostate cancer Father     deceased  . Stroke Mother     deceased  . Colon cancer Neg Hx     History   Social History  . Marital Status: Married    Spouse Name: N/A    Number of Children: N/A  . Years of Education: N/A   Social History Main Topics  . Smoking status: Former Smoker    Types: Cigarettes  . Smokeless tobacco: None   Comment: quit in the 90's  . Alcohol Use: No  . Drug Use: No  . Sexually Active: None   Other Topics Concern  . None   Social History  Narrative  . None    Review of Systems: Gen: Denies anorexia. Denies fatigue, weakness, weight loss.  CV: Denies chest pain, palpitations, syncope, peripheral edema, and claudication. Resp: Denies dyspnea at rest, cough, wheezing GI: Denies vomiting blood.  Denies dysphagia or odynophagia. Derm: Denies rash, itching, dry skin Psych: Denies depression, anxiety, memory loss, confusion. Heme: Denies bruising, bleeding, and enlarged lymph nodes.  Physical Exam: BP 122/71  Pulse 55  Temp(Src) 97.6 F (36.4 C) (Temporal)  Ht 5\' 11"  (1.803 m)  Wt 211 lb 9.6 oz (95.981 kg)  BMI 29.51 kg/m2 General:   Alert and oriented. No distress noted. Pleasant and cooperative.  Head:  Normocephalic and atraumatic. Eyes:  Conjuctiva clear without scleral icterus. Mouth:  Oral mucosa pink and moist. Good dentition. No lesions. Neck:  Supple, without mass or thyromegaly. No cervical adenopathy.  Heart:  S1, S2 present without murmurs, rubs, or gallops. Regular rate and rhythm. Abdomen:  +BS, soft, non-tender and non-distended. No rebound or guarding. No HSM or masses noted. Msk:  Symmetrical without gross deformities. Normal posture. Extremities:  Without edema. Neurologic:  Alert and  oriented x4;  grossly normal neurologically. Skin:  Intact without significant lesions or rashes. Cervical Nodes:  No  significant cervical adenopathy. Psych:  Alert and cooperative. Normal mood and affect.

## 2011-06-29 ENCOUNTER — Ambulatory Visit (INDEPENDENT_AMBULATORY_CARE_PROVIDER_SITE_OTHER): Payer: Medicare Other | Admitting: Urology

## 2011-06-29 DIAGNOSIS — N401 Enlarged prostate with lower urinary tract symptoms: Secondary | ICD-10-CM

## 2011-06-29 DIAGNOSIS — R972 Elevated prostate specific antigen [PSA]: Secondary | ICD-10-CM

## 2011-06-29 DIAGNOSIS — N509 Disorder of male genital organs, unspecified: Secondary | ICD-10-CM

## 2011-07-20 ENCOUNTER — Other Ambulatory Visit: Payer: Self-pay

## 2011-07-20 NOTE — Telephone Encounter (Signed)
11 refills sent to pharmacy in 7/12. What is the problem?

## 2011-07-24 ENCOUNTER — Telehealth: Payer: Self-pay

## 2011-07-24 NOTE — Telephone Encounter (Signed)
After review of chart, will keep pt on BID protonix. Prescription sent via fax for BID dosing, 11 refills.

## 2011-07-24 NOTE — Telephone Encounter (Signed)
Received Refill request from [facility] for refills for Pantoprazole bid for pt. He has refills for once a day. Per OV note on 05/31/2011 pt is just taking once a day. LMOM for pt to call to explain what is going on.

## 2011-07-24 NOTE — Telephone Encounter (Signed)
Per Gerrit Halls, NP, pt can have the Pantoprazole 40mg  bid. She signed order to fax to [facility] for #62 with 11 refills. I faxed.

## 2011-07-24 NOTE — Telephone Encounter (Signed)
Pt is aware the order has been faxed to [facility] for twice a day dosing.

## 2011-10-16 ENCOUNTER — Telehealth: Payer: Self-pay

## 2011-10-16 NOTE — Telephone Encounter (Signed)
Pt referred for colonoscopy. Records indicate he is not due one until 01/2016 for his next one. He said  He is not having any problems at this time, but will call if needed. I will fax the info to [facility] for their records.

## 2011-11-02 ENCOUNTER — Other Ambulatory Visit: Payer: Self-pay | Admitting: Urology

## 2011-11-02 ENCOUNTER — Ambulatory Visit (INDEPENDENT_AMBULATORY_CARE_PROVIDER_SITE_OTHER): Payer: Medicare Other | Admitting: Urology

## 2011-11-02 DIAGNOSIS — M545 Low back pain: Secondary | ICD-10-CM

## 2011-11-02 DIAGNOSIS — N411 Chronic prostatitis: Secondary | ICD-10-CM

## 2011-11-02 DIAGNOSIS — N509 Disorder of male genital organs, unspecified: Secondary | ICD-10-CM

## 2011-11-02 DIAGNOSIS — N401 Enlarged prostate with lower urinary tract symptoms: Secondary | ICD-10-CM

## 2011-11-08 ENCOUNTER — Ambulatory Visit (HOSPITAL_COMMUNITY)
Admission: RE | Admit: 2011-11-08 | Discharge: 2011-11-08 | Disposition: A | Discharge status: Home / Self Care | Payer: Medicare Other | Source: Ambulatory Visit | Attending: Urology | Admitting: Urology

## 2011-11-08 ENCOUNTER — Other Ambulatory Visit (HOSPITAL_COMMUNITY): Payer: Self-pay | Admitting: Urology

## 2011-11-08 DIAGNOSIS — M5126 Other intervertebral disc displacement, lumbar region: Secondary | ICD-10-CM | POA: Insufficient documentation

## 2011-11-08 DIAGNOSIS — N509 Disorder of male genital organs, unspecified: Secondary | ICD-10-CM

## 2011-11-08 DIAGNOSIS — M545 Low back pain, unspecified: Secondary | ICD-10-CM

## 2011-11-08 DIAGNOSIS — M47817 Spondylosis without myelopathy or radiculopathy, lumbosacral region: Secondary | ICD-10-CM | POA: Insufficient documentation

## 2011-11-08 MED ORDER — GADOBENATE DIMEGLUMINE 529 MG/ML IV SOLN
20.0000 mL | Freq: Once | INTRAVENOUS | Status: AC | PRN
  Administered 2011-11-08: 20 mL via INTRAVENOUS

## 2011-11-09 ENCOUNTER — Other Ambulatory Visit: Payer: Self-pay | Admitting: Urology

## 2011-11-09 DIAGNOSIS — N200 Calculus of kidney: Secondary | ICD-10-CM

## 2011-11-15 ENCOUNTER — Ambulatory Visit (HOSPITAL_COMMUNITY)
Admission: RE | Admit: 2011-11-15 | Discharge: 2011-11-15 | Disposition: A | Discharge status: Home / Self Care | Payer: Medicare Other | Source: Ambulatory Visit | Attending: Urology | Admitting: Urology

## 2011-11-15 DIAGNOSIS — R1032 Left lower quadrant pain: Secondary | ICD-10-CM | POA: Insufficient documentation

## 2011-11-15 DIAGNOSIS — N200 Calculus of kidney: Secondary | ICD-10-CM

## 2011-11-15 DIAGNOSIS — K5730 Diverticulosis of large intestine without perforation or abscess without bleeding: Secondary | ICD-10-CM | POA: Insufficient documentation

## 2011-11-15 DIAGNOSIS — Q619 Cystic kidney disease, unspecified: Secondary | ICD-10-CM | POA: Insufficient documentation

## 2011-12-14 ENCOUNTER — Ambulatory Visit (INDEPENDENT_AMBULATORY_CARE_PROVIDER_SITE_OTHER): Payer: Medicare Other | Admitting: Urology

## 2011-12-14 DIAGNOSIS — N509 Disorder of male genital organs, unspecified: Secondary | ICD-10-CM | POA: Diagnosis not present

## 2011-12-14 DIAGNOSIS — N401 Enlarged prostate with lower urinary tract symptoms: Secondary | ICD-10-CM | POA: Diagnosis not present

## 2012-06-10 ENCOUNTER — Encounter: Payer: Self-pay | Admitting: Internal Medicine

## 2012-06-10 ENCOUNTER — Ambulatory Visit (INDEPENDENT_AMBULATORY_CARE_PROVIDER_SITE_OTHER): Payer: Medicare Other | Admitting: Internal Medicine

## 2012-06-10 VITALS — BP 126/65 | HR 57 | Temp 98.5°F | Ht 71.0 in | Wt 217.0 lb

## 2012-06-10 DIAGNOSIS — K2270 Barrett's esophagus without dysplasia: Secondary | ICD-10-CM | POA: Diagnosis not present

## 2012-06-10 DIAGNOSIS — K219 Gastro-esophageal reflux disease without esophagitis: Secondary | ICD-10-CM

## 2012-06-10 NOTE — Progress Notes (Signed)
Primary Care Physician:  Kirk Ruths, MD Primary Gastroenterologist:  Dr. Jena Gauss  Pre-Procedure History & Physical: HPI:  [name] is a 71 y.o. male here for followup GERD/short segment Barrett's esophagus. Biopsies demonstrated short segment Barrett's without dysplasia. He is due for surveillance EGD in 2014. No dysphagia no melena no early satiety nausea or vomiting. He is somewhat overweight. He has not had any intercurrent medical problems/surgeries, except for since last being seen here. He had a normal colonoscopy in 2007.  Past Medical History  Diagnosis Date  . Barrett's esophagus     short-segment, diagnosed in 2007 by Dr. Jena Gauss  . GERD (gastroesophageal reflux disease)   . HTN (hypertension)   . BPH (benign prostatic hyperplasia)   . Vertigo   . S/P colonoscopy 2007    unremarkable  . S/P endoscopy Oct 2011     Salmon-colored epithelium coming up to 37 cm from the   . Hiatal hernia     small    Past Surgical History  Procedure Date  . Appendectomy   . Cholecystectomy   . Rotator cuff repair   . Esophagogastroduodenoscopy 09/15/2010    Dr. Jena Gauss- Salmon-colored epithelium coming up to 37 cm from the   . Colonoscopy 02/06/2006    Dr. Jena Gauss- single anal papilla, o/w normal rectum, normal colon    Prior to Admission medications   Medication Sig Start Date End Date Taking? Authorizing Provider  amitriptyline (ELAVIL) 10 MG tablet Take 10 mg by mouth at bedtime.  06/05/12  Yes Historical Provider, MD  pantoprazole (PROTONIX) 40 MG tablet Take 40 mg by mouth 2 (two) times daily.   Yes Nira Retort, NP  Tamsulosin HCl (FLOMAX) 0.4 MG CAPS Take 0.4 mg by mouth daily.  05/10/11  Yes Historical Provider, MD    Allergies as of 06/10/2012  . (No Known Allergies)    Family History  Problem Relation Age of Onset  . Prostate cancer Father     deceased  . Stroke Mother     deceased  . Colon cancer Neg Hx     History   Social History  . Marital Status: Married      Spouse Name: N/A    Number of Children: N/A  . Years of Education: N/A   Occupational History  . Not on file.   Social History Main Topics  . Smoking status: Former Smoker    Types: Cigarettes  . Smokeless tobacco: Not on file   Comment: quit in the 90's  . Alcohol Use: No  . Drug Use: No  . Sexually Active: Not on file   Other Topics Concern  . Not on file   Social History Narrative  . No narrative on file    Review of Systems: See HPI, otherwise negative ROS  Physical Exam: BP 126/65  Pulse 57  Temp 98.5 F (36.9 C) (Temporal)  Ht 5\' 11"  (1.803 m)  Wt 217 lb (98.431 kg)  BMI 30.27 kg/m2 General:   Alert,  Well-developed, well-nourished, pleasant and cooperative in NAD Skin:  Intact without significant lesions or rashes. Eyes:  Sclera clear, no icterus.   Conjunctiva pink. Ears:  Normal auditory acuity. Nose:  No deformity, discharge,  or lesions. Mouth:  No deformity or lesions. Neck:  Supple; no masses or thyromegaly. No significant cervical adenopathy. Lungs:  Clear throughout to auscultation.   No wheezes, crackles, or rhonchi. No acute distress. Heart:  Regular rate and rhythm; no murmurs, clicks, rubs,  or gallops. Abdomen:  Non-distended, normal bowel sounds.  Soft and nontender without appreciable mass or hepatosplenomegaly.  Pulses:  Normal pulses noted. Extremities:  Without clubbing or edema.  Impression/Plan:  History of GERD and short segment Barrett's esophagus. Doing well on twice a day Protonix. He is a little over weight. No alarm symptoms. He is due for surveillance EGD next year.  Average risk screening colonoscopy in 2017.  Recommendations:  Continue Protonix; I suggest we drop back to 40 mg once daily to see if he can tolerate this regimen. If not, he can go back up to a twice a day regimen. I am asking loose 10 pounds this year. Long-term risk and benefits of acid suppression therapy reviewed. In this setting, I feel the benefits outweigh the  risks.

## 2012-06-10 NOTE — Patient Instructions (Addendum)
Continue Protonix;  May try taking it just once daily.  GERD information.  Loose 10 pounds  Offfice visit in 1 year to set up repeat EGD

## 2012-06-13 ENCOUNTER — Ambulatory Visit (INDEPENDENT_AMBULATORY_CARE_PROVIDER_SITE_OTHER): Payer: Medicare Other | Admitting: Urology

## 2012-06-13 DIAGNOSIS — N401 Enlarged prostate with lower urinary tract symptoms: Secondary | ICD-10-CM

## 2012-06-13 DIAGNOSIS — N509 Disorder of male genital organs, unspecified: Secondary | ICD-10-CM

## 2012-06-13 DIAGNOSIS — R972 Elevated prostate specific antigen [PSA]: Secondary | ICD-10-CM | POA: Diagnosis not present

## 2012-06-13 DIAGNOSIS — N138 Other obstructive and reflux uropathy: Secondary | ICD-10-CM | POA: Diagnosis not present

## 2012-06-13 DIAGNOSIS — N529 Male erectile dysfunction, unspecified: Secondary | ICD-10-CM | POA: Diagnosis not present

## 2012-06-24 DIAGNOSIS — M779 Enthesopathy, unspecified: Secondary | ICD-10-CM | POA: Diagnosis not present

## 2012-06-24 DIAGNOSIS — J069 Acute upper respiratory infection, unspecified: Secondary | ICD-10-CM | POA: Diagnosis not present

## 2012-06-24 DIAGNOSIS — Z6829 Body mass index (BMI) 29.0-29.9, adult: Secondary | ICD-10-CM | POA: Diagnosis not present

## 2012-06-27 NOTE — Progress Notes (Signed)
Reminder in epic to follow up with RMR in one year

## 2012-08-11 ENCOUNTER — Other Ambulatory Visit: Payer: Self-pay

## 2012-08-11 MED ORDER — PANTOPRAZOLE SODIUM 40 MG PO TBEC: 40.0000 mg | DELAYED_RELEASE_TABLET | Freq: Two times a day (BID) | ORAL | Status: DC

## 2012-09-24 DIAGNOSIS — N509 Disorder of male genital organs, unspecified: Secondary | ICD-10-CM | POA: Diagnosis not present

## 2012-09-24 DIAGNOSIS — Z23 Encounter for immunization: Secondary | ICD-10-CM | POA: Diagnosis not present

## 2012-09-24 DIAGNOSIS — R319 Hematuria, unspecified: Secondary | ICD-10-CM | POA: Diagnosis not present

## 2012-09-24 DIAGNOSIS — N453 Epididymo-orchitis: Secondary | ICD-10-CM | POA: Diagnosis not present

## 2012-09-26 ENCOUNTER — Other Ambulatory Visit (HOSPITAL_COMMUNITY): Payer: Self-pay | Admitting: Physician Assistant

## 2012-09-26 DIAGNOSIS — R102 Pelvic and perineal pain: Secondary | ICD-10-CM

## 2012-09-26 DIAGNOSIS — N453 Epididymo-orchitis: Secondary | ICD-10-CM

## 2012-09-26 DIAGNOSIS — N509 Disorder of male genital organs, unspecified: Secondary | ICD-10-CM

## 2012-09-29 ENCOUNTER — Ambulatory Visit (HOSPITAL_COMMUNITY)
Admission: RE | Admit: 2012-09-29 | Discharge: 2012-09-29 | Disposition: A | Discharge status: Home / Self Care | Payer: Medicare Other | Source: Ambulatory Visit | Attending: Physician Assistant | Admitting: Physician Assistant

## 2012-09-29 DIAGNOSIS — K5730 Diverticulosis of large intestine without perforation or abscess without bleeding: Secondary | ICD-10-CM | POA: Diagnosis not present

## 2012-09-29 DIAGNOSIS — N509 Disorder of male genital organs, unspecified: Secondary | ICD-10-CM | POA: Diagnosis not present

## 2012-09-29 DIAGNOSIS — N200 Calculus of kidney: Secondary | ICD-10-CM | POA: Diagnosis not present

## 2012-09-29 DIAGNOSIS — R102 Pelvic and perineal pain: Secondary | ICD-10-CM

## 2012-09-29 DIAGNOSIS — R109 Unspecified abdominal pain: Secondary | ICD-10-CM | POA: Diagnosis not present

## 2012-09-29 DIAGNOSIS — N453 Epididymo-orchitis: Secondary | ICD-10-CM

## 2012-09-30 DIAGNOSIS — Z0000 Encounter for general adult medical examination without abnormal findings: Secondary | ICD-10-CM | POA: Diagnosis not present

## 2012-10-02 DIAGNOSIS — Z961 Presence of intraocular lens: Secondary | ICD-10-CM | POA: Diagnosis not present

## 2013-03-18 DIAGNOSIS — Z125 Encounter for screening for malignant neoplasm of prostate: Secondary | ICD-10-CM | POA: Diagnosis not present

## 2013-03-18 DIAGNOSIS — Z79899 Other long term (current) drug therapy: Secondary | ICD-10-CM | POA: Diagnosis not present

## 2013-03-18 DIAGNOSIS — F411 Generalized anxiety disorder: Secondary | ICD-10-CM | POA: Diagnosis not present

## 2013-03-18 DIAGNOSIS — Z6829 Body mass index (BMI) 29.0-29.9, adult: Secondary | ICD-10-CM | POA: Diagnosis not present

## 2013-04-06 DIAGNOSIS — M5382 Other specified dorsopathies, cervical region: Secondary | ICD-10-CM | POA: Diagnosis not present

## 2013-04-06 DIAGNOSIS — J029 Acute pharyngitis, unspecified: Secondary | ICD-10-CM | POA: Diagnosis not present

## 2013-04-06 DIAGNOSIS — Z6829 Body mass index (BMI) 29.0-29.9, adult: Secondary | ICD-10-CM | POA: Diagnosis not present

## 2013-04-14 DIAGNOSIS — Z6829 Body mass index (BMI) 29.0-29.9, adult: Secondary | ICD-10-CM | POA: Diagnosis not present

## 2013-04-14 DIAGNOSIS — K219 Gastro-esophageal reflux disease without esophagitis: Secondary | ICD-10-CM | POA: Diagnosis not present

## 2013-04-14 DIAGNOSIS — J019 Acute sinusitis, unspecified: Secondary | ICD-10-CM | POA: Diagnosis not present

## 2013-04-24 ENCOUNTER — Ambulatory Visit (INDEPENDENT_AMBULATORY_CARE_PROVIDER_SITE_OTHER): Payer: Medicare Other | Admitting: Urology

## 2013-04-24 DIAGNOSIS — N401 Enlarged prostate with lower urinary tract symptoms: Secondary | ICD-10-CM

## 2013-04-24 DIAGNOSIS — R972 Elevated prostate specific antigen [PSA]: Secondary | ICD-10-CM

## 2013-04-24 DIAGNOSIS — N529 Male erectile dysfunction, unspecified: Secondary | ICD-10-CM | POA: Diagnosis not present

## 2013-04-24 DIAGNOSIS — N509 Disorder of male genital organs, unspecified: Secondary | ICD-10-CM | POA: Diagnosis not present

## 2013-06-16 ENCOUNTER — Ambulatory Visit (INDEPENDENT_AMBULATORY_CARE_PROVIDER_SITE_OTHER): Payer: Medicare Other | Admitting: Internal Medicine

## 2013-06-16 ENCOUNTER — Encounter: Payer: Self-pay | Admitting: Internal Medicine

## 2013-06-16 VITALS — BP 98/65 | HR 67 | Temp 97.9°F | Ht 71.0 in | Wt 214.4 lb

## 2013-06-16 DIAGNOSIS — K2270 Barrett's esophagus without dysplasia: Secondary | ICD-10-CM | POA: Diagnosis not present

## 2013-06-16 DIAGNOSIS — K219 Gastro-esophageal reflux disease without esophagitis: Secondary | ICD-10-CM | POA: Diagnosis not present

## 2013-06-16 NOTE — Progress Notes (Signed)
Primary Care Physician:  Kirk Ruths, MD Primary Gastroenterologist:  Dr. Jena Gauss  Pre-Procedure History & Physical: HPI:  [name] is a 72 y.o. male here for followup of Barrett's esophagus/GERD. Last EGD demonstrated  short segment Barrett's in 2011. He's done well on Protonix. He does requires 40 mg twice daily to control symptoms. No dysphagia, early satiety or nausea/vomiting. No melena or hematochezia. He's lost 3 pounds since he was last seen.  Past Medical History  Diagnosis Date  . Barrett's esophagus     short-segment, diagnosed in 2007 by Dr. Jena Gauss  . GERD (gastroesophageal reflux disease)   . HTN (hypertension)   . BPH (benign prostatic hyperplasia)   . Vertigo   . S/P colonoscopy 2007    unremarkable  . S/P endoscopy Oct 2011     Salmon-colored epithelium coming up to 37 cm from the   . Hiatal hernia     small    Past Surgical History  Procedure Laterality Date  . Appendectomy    . Cholecystectomy    . Rotator cuff repair    . Esophagogastroduodenoscopy  09/15/2010    Dr. Jena Gauss- Salmon-colored epithelium coming up to 37 cm from the   . Colonoscopy  02/06/2006    Dr. Jena Gauss- single anal papilla, o/w normal rectum, normal colon    Prior to Admission medications   Medication Sig Start Date End Date Taking? Authorizing Provider  amitriptyline (ELAVIL) 10 MG tablet Take 10 mg by mouth at bedtime.  06/05/12  Yes Historical Provider, MD  pantoprazole (PROTONIX) 40 MG tablet Take 1 tablet (40 mg total) by mouth 2 (two) times daily. 08/11/12  Yes Nira Retort, NP  Tamsulosin HCl (FLOMAX) 0.4 MG CAPS Take 0.4 mg by mouth daily.  05/10/11  Yes Historical Provider, MD    Allergies as of 06/16/2013  . (No Known Allergies)    Family History  Problem Relation Age of Onset  . Prostate cancer Father     deceased  . Stroke Mother     deceased  . Colon cancer Neg Hx     History   Social History  . Marital Status: Married    Spouse Name: N/A    Number of  Children: N/A  . Years of Education: N/A   Occupational History  . Not on file.   Social History Main Topics  . Smoking status: Former Smoker    Types: Cigarettes  . Smokeless tobacco: Not on file     Comment: quit in the 90's  . Alcohol Use: No  . Drug Use: No  . Sexually Active: Not on file   Other Topics Concern  . Not on file   Social History Narrative  . No narrative on file    Review of Systems: See HPI, otherwise negative ROS  Physical Exam: BP 98/65  Pulse 67  Temp(Src) 97.9 F (36.6 C) (Oral)  Ht 5\' 11"  (1.803 m)  Wt 214 lb 6.4 oz (97.251 kg)  BMI 29.92 kg/m2 General:   Alert,  Well-developed, well-nourished, pleasant and cooperative in NAD Skin:  Intact without significant lesions or rashes. Eyes:  Sclera clear, no icterus.   Conjunctiva pink. Ears:  Normal auditory acuity. Nose:  No deformity, discharge,  or lesions. Mouth:  No deformity or lesions. Neck:  Supple; no masses or thyromegaly. No significant cervical adenopathy. Lungs:  Clear throughout to auscultation.   No wheezes, crackles, or rhonchi. No acute distress. Heart:  Regular rate and rhythm; no murmurs, clicks, rubs,  or gallops. Abdomen: Non-distended, normal bowel sounds.  Soft and nontender without appreciable mass or hepatosplenomegaly.  Pulses:  Normal pulses noted. Extremities:  Without clubbing or edema.  Impression/Plan:  Pleasant 72 year old gentleman with short segment Barrett's esophagus. He is due for surveillance EGD with biopsies. Reflux symptoms well controlled on twice daily PPI.  If he could lose additional weight, he may be able to get by with once daily therapy  Recommendations:  Proceed with EGD with surveillance biopsies.The risks, benefits, limitations, alternatives and imponderables have been reviewed with the patient. Potential for esophageal dilation, biopsy, etc. have also been reviewed.  Questions have been answered. All parties agreeable.

## 2013-06-16 NOTE — Patient Instructions (Addendum)
Schedule EGD to follow-up Barrett's esophagus  Continue Protonix 40 mg twice daily

## 2013-06-19 ENCOUNTER — Encounter (HOSPITAL_COMMUNITY): Payer: Self-pay

## 2013-07-02 ENCOUNTER — Encounter (HOSPITAL_COMMUNITY): Payer: Self-pay | Admitting: *Deleted

## 2013-07-02 ENCOUNTER — Ambulatory Visit (HOSPITAL_COMMUNITY)
Admission: RE | Admit: 2013-07-02 | Discharge: 2013-07-02 | Disposition: A | Discharge status: Home / Self Care | Payer: Medicare Other | Source: Ambulatory Visit | Attending: Internal Medicine | Admitting: Internal Medicine

## 2013-07-02 ENCOUNTER — Encounter (HOSPITAL_COMMUNITY)
Admission: RE | Disposition: A | Discharge status: Home / Self Care | Payer: Self-pay | Source: Ambulatory Visit | Attending: Internal Medicine

## 2013-07-02 DIAGNOSIS — I10 Essential (primary) hypertension: Secondary | ICD-10-CM | POA: Insufficient documentation

## 2013-07-02 DIAGNOSIS — K2270 Barrett's esophagus without dysplasia: Secondary | ICD-10-CM | POA: Diagnosis not present

## 2013-07-02 DIAGNOSIS — K449 Diaphragmatic hernia without obstruction or gangrene: Secondary | ICD-10-CM

## 2013-07-02 HISTORY — PX: ESOPHAGOGASTRODUODENOSCOPY: SHX5428

## 2013-07-02 SURGERY — EGD (ESOPHAGOGASTRODUODENOSCOPY)
Anesthesia: Moderate Sedation

## 2013-07-02 MED ORDER — ONDANSETRON HCL 4 MG/2ML IJ SOLN
INTRAMUSCULAR | Status: DC | PRN
  Administered 2013-07-02: 4 mg via INTRAVENOUS

## 2013-07-02 MED ORDER — ONDANSETRON HCL 4 MG/2ML IJ SOLN
INTRAMUSCULAR | Status: AC
  Filled 2013-07-02: qty 2

## 2013-07-02 MED ORDER — SODIUM CHLORIDE 0.9 % IV SOLN
INTRAVENOUS | Status: DC
  Administered 2013-07-02: 1000 mL via INTRAVENOUS

## 2013-07-02 MED ORDER — MEPERIDINE HCL 100 MG/ML IJ SOLN
INTRAMUSCULAR | Status: DC | PRN
  Administered 2013-07-02 (×2): 25 mg via INTRAVENOUS
  Administered 2013-07-02: 50 mg via INTRAVENOUS

## 2013-07-02 MED ORDER — MEPERIDINE HCL 100 MG/ML IJ SOLN
INTRAMUSCULAR | Status: AC
  Filled 2013-07-02: qty 1

## 2013-07-02 MED ORDER — MIDAZOLAM HCL 5 MG/5ML IJ SOLN
INTRAMUSCULAR | Status: AC
  Filled 2013-07-02: qty 10

## 2013-07-02 MED ORDER — STERILE WATER FOR IRRIGATION IR SOLN
Status: DC | PRN
  Administered 2013-07-02: 10:00:00

## 2013-07-02 MED ORDER — MIDAZOLAM HCL 5 MG/5ML IJ SOLN
INTRAMUSCULAR | Status: DC | PRN
  Administered 2013-07-02 (×2): 2 mg via INTRAVENOUS
  Administered 2013-07-02: 1 mg via INTRAVENOUS

## 2013-07-02 MED ORDER — BUTAMBEN-TETRACAINE-BENZOCAINE 2-2-14 % EX AERO
INHALATION_SPRAY | CUTANEOUS | Status: DC | PRN
  Administered 2013-07-02: 2 via TOPICAL

## 2013-07-02 MED ORDER — SODIUM CHLORIDE 0.9 % IV SOLN
Freq: Once | INTRAVENOUS | Status: AC
  Administered 2013-07-02: 12:00:00 via INTRAVENOUS

## 2013-07-02 NOTE — Op Note (Signed)
[facility] [address] [place], [address]   ENDOSCOPY PROCEDURE REPORT  PATIENT: [name], [name]  MR#: [id number] BIRTHDATE: March 12, 1941 , 72  yrs. old GENDER: Male ENDOSCOPIST: R.  Roetta Sessions, MD FACP FACG REFERRED BY:  Karleen Hampshire, M.D. PROCEDURE DATE:  07/02/2013 PROCEDURE:     EGD with esophageal  INDICATIONS:     History short segment Barrett's esophagus; surveillance examination  INFORMED CONSENT:   The risks, benefits, limitations, alternatives and imponderables have been discussed.  The potential for biopsy, esophogeal dilation, etc. have also been reviewed.  Questions have been answered.  All parties agreeable.  Please see the history and physical in the medical record for more information.  MEDICATIONS:   Versed 5 mg IV and Demerol 100 mg IV in divided doses. Zofran 4 mg IV. Cetacaine spray.  DESCRIPTION OF PROCEDURE:   The EG-[id number] ([id number])  endoscope was introduced through the mouth and advanced to the second portion of the duodenum without difficulty or limitations.  The mucosal surfaces were surveyed very carefully during advancement of the scope and upon withdrawal.  Retroflexion view of the proximal stomach and esophagogastric junction was performed.      FINDINGS:  Salmon-colored epithelium coming up 2 cm from the EG junction (39-37 cm from incisors). There waas no esophagitis. No tumor. Stomach empty. Small hiatal hernia. Normal gastric mucosa. Patent pylorus. Normal first and second portion of the duodenum  THERAPEUTIC / DIAGNOSTIC MANEUVERS PERFORMED:  Biopsies of the abnormal distal esophageal mucosa were taken for histologic study   COMPLICATIONS:  None  IMPRESSION:  Short segment Barrett's esophagus-status post biopsy. Hiatal hernia.  RECOMMENDATIONS:  Continue Protonix. Further recommendations to follow pending review of pathology report .    _______________________________ R. Roetta Sessions, MD FACP  [facility] eSigned:  R. Roetta Sessions, MD FACP [facility] 07/02/2013 10:19 AM     CC:

## 2013-07-02 NOTE — Interval H&P Note (Signed)
History and Physical Interval Note:  07/02/2013 9:50 AM  [name]  has presented today for surgery, with the diagnosis of Barretts Esophagus  The various methods of treatment have been discussed with the patient and family. After consideration of risks, benefits and other options for treatment, the patient has consented to  Procedure(s) with comments: ESOPHAGOGASTRODUODENOSCOPY (EGD) (N/A) - 9:30-moved to 9:45 [name] to notify pt as a surgical intervention .  The patient's history has been reviewed, patient examined, no change in status, stable for surgery.  I have reviewed the patient's chart and labs.  Questions were answered to the patient's satisfaction.     No change. EGD per plan.The risks, benefits, limitations, alternatives and imponderables have been reviewed with the patient. Potential for esophageal dilation, biopsy, etc. have also been reviewed.  Questions have been answered. All parties agreeable.   Eula Listen

## 2013-07-02 NOTE — Progress Notes (Signed)
Pt's heart rate in pre-op has stayed between 39-48. BP has stayed around 95/55. Pt is alert, awake and oriented. No complaints of dizziness or lightheadedness. Dr. Jena Gauss was notified. Pt's endo nurse, Brain Hilts, RN said the pt's heartrate fluctuated between 45-55 during procedure. Dr. Jena Gauss ordered for pt to have 500cc of NS and to stay in post op for another 30 minutes. Will continue to monitor.

## 2013-07-02 NOTE — H&P (View-Only) (Signed)
Primary Care Physician:  MCGOUGH,WILLIAM M, MD Primary Gastroenterologist:  Dr. Sherilynn Dieu  Pre-Procedure History & Physical: HPI:  [name] is a 72 y.o. male here for followup of Barrett's esophagus/GERD. Last EGD demonstrated  short segment Barrett's in 2011. He's done well on Protonix. He does requires 40 mg twice daily to control symptoms. No dysphagia, early satiety or nausea/vomiting. No melena or hematochezia. He's lost 3 pounds since he was last seen.  Past Medical History  Diagnosis Date  . Barrett's esophagus     short-segment, diagnosed in 2007 by Dr. Ivor Kishi  . GERD (gastroesophageal reflux disease)   . HTN (hypertension)   . BPH (benign prostatic hyperplasia)   . Vertigo   . S/P colonoscopy 2007    unremarkable  . S/P endoscopy Oct 2011     Salmon-colored epithelium coming up to 37 cm from the   . Hiatal hernia     small    Past Surgical History  Procedure Laterality Date  . Appendectomy    . Cholecystectomy    . Rotator cuff repair    . Esophagogastroduodenoscopy  09/15/2010    Dr. Ltanya Bayley- Salmon-colored epithelium coming up to 37 cm from the   . Colonoscopy  02/06/2006    Dr. Adreanna Fickel- single anal papilla, o/w normal rectum, normal colon    Prior to Admission medications   Medication Sig Start Date End Date Taking? Authorizing Provider  amitriptyline (ELAVIL) 10 MG tablet Take 10 mg by mouth at bedtime.  06/05/12  Yes Historical Provider, MD  pantoprazole (PROTONIX) 40 MG tablet Take 1 tablet (40 mg total) by mouth 2 (two) times daily. 08/11/12  Yes Anna W Sams, NP  Tamsulosin HCl (FLOMAX) 0.4 MG CAPS Take 0.4 mg by mouth daily.  05/10/11  Yes Historical Provider, MD    Allergies as of 06/16/2013  . (No Known Allergies)    Family History  Problem Relation Age of Onset  . Prostate cancer Father     deceased  . Stroke Mother     deceased  . Colon cancer Neg Hx     History   Social History  . Marital Status: Married    Spouse Name: N/A    Number of  Children: N/A  . Years of Education: N/A   Occupational History  . Not on file.   Social History Main Topics  . Smoking status: Former Smoker    Types: Cigarettes  . Smokeless tobacco: Not on file     Comment: quit in the 90's  . Alcohol Use: No  . Drug Use: No  . Sexually Active: Not on file   Other Topics Concern  . Not on file   Social History Narrative  . No narrative on file    Review of Systems: See HPI, otherwise negative ROS  Physical Exam: BP 98/65  Pulse 67  Temp(Src) 97.9 F (36.6 C) (Oral)  Ht 5' 11" (1.803 m)  Wt 214 lb 6.4 oz (97.251 kg)  BMI 29.92 kg/m2 General:   Alert,  Well-developed, well-nourished, pleasant and cooperative in NAD Skin:  Intact without significant lesions or rashes. Eyes:  Sclera clear, no icterus.   Conjunctiva pink. Ears:  Normal auditory acuity. Nose:  No deformity, discharge,  or lesions. Mouth:  No deformity or lesions. Neck:  Supple; no masses or thyromegaly. No significant cervical adenopathy. Lungs:  Clear throughout to auscultation.   No wheezes, crackles, or rhonchi. No acute distress. Heart:  Regular rate and rhythm; no murmurs, clicks, rubs,    or gallops. Abdomen: Non-distended, normal bowel sounds.  Soft and nontender without appreciable mass or hepatosplenomegaly.  Pulses:  Normal pulses noted. Extremities:  Without clubbing or edema.  Impression/Plan:  Pleasant 72-year-old gentleman with short segment Barrett's esophagus. He is due for surveillance EGD with biopsies. Reflux symptoms well controlled on twice daily PPI.  If he could lose additional weight, he may be able to get by with once daily therapy  Recommendations:  Proceed with EGD with surveillance biopsies.The risks, benefits, limitations, alternatives and imponderables have been reviewed with the patient. Potential for esophageal dilation, biopsy, etc. have also been reviewed.  Questions have been answered. All parties agreeable. 

## 2013-07-03 ENCOUNTER — Encounter: Payer: Self-pay | Admitting: Internal Medicine

## 2013-07-06 ENCOUNTER — Encounter (HOSPITAL_COMMUNITY): Payer: Self-pay | Admitting: Internal Medicine

## 2013-08-17 ENCOUNTER — Other Ambulatory Visit: Payer: Self-pay

## 2013-08-17 MED ORDER — PANTOPRAZOLE SODIUM 40 MG PO TBEC: 40.0000 mg | DELAYED_RELEASE_TABLET | Freq: Two times a day (BID) | ORAL | Status: DC

## 2013-09-10 ENCOUNTER — Encounter: Payer: Self-pay | Admitting: Internal Medicine

## 2013-09-29 ENCOUNTER — Ambulatory Visit: Payer: Medicare Other | Admitting: Internal Medicine

## 2013-10-01 DIAGNOSIS — H1045 Other chronic allergic conjunctivitis: Secondary | ICD-10-CM | POA: Diagnosis not present

## 2013-10-05 DIAGNOSIS — Z23 Encounter for immunization: Secondary | ICD-10-CM | POA: Diagnosis not present

## 2013-10-13 ENCOUNTER — Encounter: Payer: Self-pay | Admitting: Internal Medicine

## 2013-10-13 ENCOUNTER — Ambulatory Visit (INDEPENDENT_AMBULATORY_CARE_PROVIDER_SITE_OTHER): Payer: Medicare Other | Admitting: Internal Medicine

## 2013-10-13 VITALS — BP 116/68 | HR 63 | Temp 98.0°F | Wt 213.8 lb

## 2013-10-13 DIAGNOSIS — K2270 Barrett's esophagus without dysplasia: Secondary | ICD-10-CM | POA: Diagnosis not present

## 2013-10-13 DIAGNOSIS — K219 Gastro-esophageal reflux disease without esophagitis: Secondary | ICD-10-CM | POA: Diagnosis not present

## 2013-10-13 NOTE — Patient Instructions (Signed)
Continue Protonix daily  Loose 10 pounds in next 6 months  GERD information  Plan for repeat EGD and Colonoscopy in 3 years  Office visit in 1 year

## 2013-10-13 NOTE — Progress Notes (Signed)
Primary Care Physician:  Kirk Ruths, MD Primary Gastroenterologist:  Dr. Jena Gauss  Pre-Procedure History & Physical: HPI:  [name] is a 72 y.o. male here for followup of GERD/Barrett's esophagus. He's done well on Protonix 40 mg twice daily. Occasionally, he does have nocturnal reflux when he over eats late at night. No dysphagia. Barrett's biopsies without dysplasia earlier this year. He will be due for surveillance EGD and a screening colonoscopy in 2017. He continues to be significantly over his ideal body weight.  Past Medical History  Diagnosis Date  . Barrett's esophagus     short-segment, diagnosed in 2007 by Dr. Jena Gauss  . GERD (gastroesophageal reflux disease)   . HTN (hypertension)   . BPH (benign prostatic hyperplasia)   . Vertigo   . S/P colonoscopy 2007    unremarkable  . S/P endoscopy Oct 2011     Salmon-colored epithelium coming up to 37 cm from the   . Hiatal hernia     small    Past Surgical History  Procedure Laterality Date  . Appendectomy    . Cholecystectomy    . Rotator cuff repair    . Esophagogastroduodenoscopy  09/15/2010    Dr. Jena Gauss- Salmon-colored epithelium coming up to 37 cm from the   . Colonoscopy  02/06/2006    Dr. Jena Gauss- single anal papilla, o/w normal rectum, normal colon  . Esophagogastroduodenoscopy N/A 07/02/2013    Dr. Jena Gauss- Barretts on bx. hiatal hernia    Prior to Admission medications   Medication Sig Start Date End Date Taking? Authorizing Provider  amitriptyline (ELAVIL) 10 MG tablet Take 5 mg by mouth daily as needed. depression 06/05/12  Yes Historical Provider, MD  Fexofenadine HCl (ALLEGRA PO) Take 1 tablet by mouth daily.   Yes Historical Provider, MD  ibuprofen (ADVIL,MOTRIN) 200 MG tablet Take 600 mg by mouth daily as needed for pain.   Yes Historical Provider, MD  pantoprazole (PROTONIX) 40 MG tablet Take 1 tablet (40 mg total) by mouth 2 (two) times daily. 08/17/13  Yes Nira Retort, NP  Tamsulosin HCl (FLOMAX) 0.4 MG  CAPS Take 0.4 mg by mouth 2 (two) times daily.  05/10/11  Yes Historical Provider, MD    Allergies as of 10/13/2013  . (No Known Allergies)    Family History  Problem Relation Age of Onset  . Prostate cancer Father     deceased  . Stroke Mother     deceased  . Colon cancer Neg Hx     History   Social History  . Marital Status: Married    Spouse Name: N/A    Number of Children: N/A  . Years of Education: N/A   Occupational History  . Not on file.   Social History Main Topics  . Smoking status: Former Smoker -- 1.50 packs/day for 30 years    Types: Cigarettes    Quit date: 11/18/1993  . Smokeless tobacco: Not on file     Comment: quit in the 90's  . Alcohol Use: No  . Drug Use: No  . Sexual Activity: Not on file   Other Topics Concern  . Not on file   Social History Narrative  . No narrative on file    Review of Systems: See HPI, otherwise negative ROS  Physical Exam: BP 116/68  Pulse 63  Temp(Src) 98 F (36.7 C) (Oral)  Wt 213 lb 12.8 oz (96.979 kg) General:   Alert,  Well-developed, well-nourished, pleasant and cooperative in NAD Skin:  Intact without  significant lesions or rashes. Eyes:  Sclera clear, no icterus.   Conjunctiva pink. Ears:  Normal auditory acuity. Nose:  No deformity, discharge,  or lesions. Mouth:  No deformity or lesions. Neck:  Supple; no masses or thyromegaly. No significant cervical adenopathy. Lungs:  Clear throughout to auscultation.   No wheezes, crackles, or rhonchi. No acute distress. Heart:  Regular rate and rhythm; no murmurs, clicks, rubs,  or gallops. Abdomen: Non-distended, normal bowel sounds.  Soft and nontender without appreciable mass or hepatosplenomegaly.  Pulses:  Normal pulses noted. Extremities:  Without clubbing or edema.  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  Recommendations:  Discussed the multipronged approach to GERD. He is to continue Protonix twice a day. I feel the benefits outweigh the risks. I have asked him to plan on losing 10 pounds over the next 6 months. Literature on GERD provided.  Office visit here in one year.

## 2013-10-23 DIAGNOSIS — R42 Dizziness and giddiness: Secondary | ICD-10-CM | POA: Diagnosis not present

## 2013-10-23 DIAGNOSIS — J069 Acute upper respiratory infection, unspecified: Secondary | ICD-10-CM | POA: Diagnosis not present

## 2013-10-23 DIAGNOSIS — Z6829 Body mass index (BMI) 29.0-29.9, adult: Secondary | ICD-10-CM | POA: Diagnosis not present

## 2013-12-08 DIAGNOSIS — H538 Other visual disturbances: Secondary | ICD-10-CM | POA: Diagnosis not present

## 2013-12-08 DIAGNOSIS — E119 Type 2 diabetes mellitus without complications: Secondary | ICD-10-CM | POA: Diagnosis not present

## 2013-12-08 DIAGNOSIS — Z961 Presence of intraocular lens: Secondary | ICD-10-CM | POA: Diagnosis not present

## 2013-12-08 DIAGNOSIS — H04129 Dry eye syndrome of unspecified lacrimal gland: Secondary | ICD-10-CM | POA: Diagnosis not present

## 2013-12-11 DIAGNOSIS — Z6828 Body mass index (BMI) 28.0-28.9, adult: Secondary | ICD-10-CM | POA: Diagnosis not present

## 2013-12-11 DIAGNOSIS — N400 Enlarged prostate without lower urinary tract symptoms: Secondary | ICD-10-CM | POA: Diagnosis not present

## 2013-12-11 DIAGNOSIS — K219 Gastro-esophageal reflux disease without esophagitis: Secondary | ICD-10-CM | POA: Diagnosis not present

## 2013-12-22 ENCOUNTER — Telehealth (HOSPITAL_COMMUNITY): Payer: Self-pay | Admitting: *Deleted

## 2013-12-23 ENCOUNTER — Other Ambulatory Visit (HOSPITAL_COMMUNITY): Payer: Self-pay | Admitting: Family Medicine

## 2013-12-23 DIAGNOSIS — I739 Peripheral vascular disease, unspecified: Secondary | ICD-10-CM

## 2013-12-24 DIAGNOSIS — Z6828 Body mass index (BMI) 28.0-28.9, adult: Secondary | ICD-10-CM | POA: Diagnosis not present

## 2013-12-24 DIAGNOSIS — J111 Influenza due to unidentified influenza virus with other respiratory manifestations: Secondary | ICD-10-CM | POA: Diagnosis not present

## 2013-12-24 DIAGNOSIS — R51 Headache: Secondary | ICD-10-CM | POA: Diagnosis not present

## 2013-12-24 DIAGNOSIS — J069 Acute upper respiratory infection, unspecified: Secondary | ICD-10-CM | POA: Diagnosis not present

## 2013-12-25 ENCOUNTER — Ambulatory Visit (HOSPITAL_COMMUNITY)
Admission: RE | Admit: 2013-12-25 | Discharge: 2013-12-25 | Disposition: A | Discharge status: Home / Self Care | Payer: Medicare Other | Source: Ambulatory Visit | Attending: Cardiovascular Disease | Admitting: Cardiovascular Disease

## 2013-12-25 DIAGNOSIS — R209 Unspecified disturbances of skin sensation: Secondary | ICD-10-CM | POA: Diagnosis not present

## 2013-12-25 DIAGNOSIS — I739 Peripheral vascular disease, unspecified: Secondary | ICD-10-CM

## 2013-12-25 DIAGNOSIS — I70219 Atherosclerosis of native arteries of extremities with intermittent claudication, unspecified extremity: Secondary | ICD-10-CM

## 2013-12-25 NOTE — Progress Notes (Signed)
Arterial Duplex Lower Ext. Completed. [name], BS, RDMS, RVT  

## 2013-12-28 ENCOUNTER — Other Ambulatory Visit: Payer: Self-pay | Admitting: Gastroenterology

## 2014-01-27 DIAGNOSIS — Z23 Encounter for immunization: Secondary | ICD-10-CM | POA: Diagnosis not present

## 2014-01-27 DIAGNOSIS — N400 Enlarged prostate without lower urinary tract symptoms: Secondary | ICD-10-CM | POA: Diagnosis not present

## 2014-01-27 DIAGNOSIS — Z6828 Body mass index (BMI) 28.0-28.9, adult: Secondary | ICD-10-CM | POA: Diagnosis not present

## 2014-01-27 DIAGNOSIS — Z0000 Encounter for general adult medical examination without abnormal findings: Secondary | ICD-10-CM | POA: Diagnosis not present

## 2014-01-27 DIAGNOSIS — K219 Gastro-esophageal reflux disease without esophagitis: Secondary | ICD-10-CM | POA: Diagnosis not present

## 2014-03-11 ENCOUNTER — Ambulatory Visit (INDEPENDENT_AMBULATORY_CARE_PROVIDER_SITE_OTHER): Payer: Medicare Other | Admitting: Otolaryngology

## 2014-03-11 DIAGNOSIS — H6120 Impacted cerumen, unspecified ear: Secondary | ICD-10-CM

## 2014-03-11 DIAGNOSIS — H903 Sensorineural hearing loss, bilateral: Secondary | ICD-10-CM

## 2014-03-11 DIAGNOSIS — H9319 Tinnitus, unspecified ear: Secondary | ICD-10-CM | POA: Diagnosis not present

## 2014-03-22 DIAGNOSIS — Z6828 Body mass index (BMI) 28.0-28.9, adult: Secondary | ICD-10-CM | POA: Diagnosis not present

## 2014-03-22 DIAGNOSIS — M5430 Sciatica, unspecified side: Secondary | ICD-10-CM | POA: Diagnosis not present

## 2014-03-22 DIAGNOSIS — N400 Enlarged prostate without lower urinary tract symptoms: Secondary | ICD-10-CM | POA: Diagnosis not present

## 2014-03-27 ENCOUNTER — Emergency Department (HOSPITAL_COMMUNITY): Payer: Medicare Other

## 2014-03-27 ENCOUNTER — Emergency Department (HOSPITAL_COMMUNITY)
Admission: EM | Admit: 2014-03-27 | Discharge: 2014-03-27 | Disposition: A | Discharge status: Home / Self Care | Payer: Medicare Other | Attending: Emergency Medicine | Admitting: Emergency Medicine

## 2014-03-27 ENCOUNTER — Encounter (HOSPITAL_COMMUNITY): Payer: Self-pay | Admitting: Emergency Medicine

## 2014-03-27 DIAGNOSIS — Z23 Encounter for immunization: Secondary | ICD-10-CM | POA: Diagnosis not present

## 2014-03-27 DIAGNOSIS — N453 Epididymo-orchitis: Secondary | ICD-10-CM

## 2014-03-27 DIAGNOSIS — N200 Calculus of kidney: Secondary | ICD-10-CM | POA: Diagnosis not present

## 2014-03-27 DIAGNOSIS — Z9089 Acquired absence of other organs: Secondary | ICD-10-CM | POA: Diagnosis not present

## 2014-03-27 DIAGNOSIS — Z87891 Personal history of nicotine dependence: Secondary | ICD-10-CM | POA: Insufficient documentation

## 2014-03-27 DIAGNOSIS — Z6825 Body mass index (BMI) 25.0-25.9, adult: Secondary | ICD-10-CM | POA: Diagnosis not present

## 2014-03-27 DIAGNOSIS — R350 Frequency of micturition: Secondary | ICD-10-CM | POA: Diagnosis not present

## 2014-03-27 DIAGNOSIS — K219 Gastro-esophageal reflux disease without esophagitis: Secondary | ICD-10-CM | POA: Diagnosis not present

## 2014-03-27 DIAGNOSIS — N452 Orchitis: Secondary | ICD-10-CM | POA: Diagnosis not present

## 2014-03-27 DIAGNOSIS — R110 Nausea: Secondary | ICD-10-CM | POA: Diagnosis not present

## 2014-03-27 DIAGNOSIS — Z79899 Other long term (current) drug therapy: Secondary | ICD-10-CM | POA: Diagnosis not present

## 2014-03-27 DIAGNOSIS — I10 Essential (primary) hypertension: Secondary | ICD-10-CM | POA: Diagnosis not present

## 2014-03-27 DIAGNOSIS — R3915 Urgency of urination: Secondary | ICD-10-CM | POA: Insufficient documentation

## 2014-03-27 DIAGNOSIS — N201 Calculus of ureter: Secondary | ICD-10-CM | POA: Diagnosis not present

## 2014-03-27 LAB — CBC WITH DIFFERENTIAL/PLATELET
BASOS ABS: 0 10*3/uL (ref 0.0–0.1)
BASOS PCT: 0 % (ref 0–1)
EOS ABS: 0.1 10*3/uL (ref 0.0–0.7)
Eosinophils Relative: 1 % (ref 0–5)
HCT: 43.2 % (ref 39.0–52.0)
Hemoglobin: 15.4 g/dL (ref 13.0–17.0)
Lymphocytes Relative: 20 % (ref 12–46)
Lymphs Abs: 1.6 10*3/uL (ref 0.7–4.0)
MCH: 32.4 pg (ref 26.0–34.0)
MCHC: 35.6 g/dL (ref 30.0–36.0)
MCV: 90.9 fL (ref 78.0–100.0)
Monocytes Absolute: 0.8 10*3/uL (ref 0.1–1.0)
Monocytes Relative: 10 % (ref 3–12)
NEUTROS PCT: 69 % (ref 43–77)
Neutro Abs: 5.6 10*3/uL (ref 1.7–7.7)
PLATELETS: 176 10*3/uL (ref 150–400)
RBC: 4.75 MIL/uL (ref 4.22–5.81)
RDW: 12.9 % (ref 11.5–15.5)
WBC: 8.1 10*3/uL (ref 4.0–10.5)

## 2014-03-27 LAB — COMPREHENSIVE METABOLIC PANEL
ALBUMIN: 4 g/dL (ref 3.5–5.2)
ALK PHOS: 56 U/L (ref 39–117)
ALT: 16 U/L (ref 0–53)
AST: 15 U/L (ref 0–37)
BUN: 17 mg/dL (ref 6–23)
CO2: 26 mEq/L (ref 19–32)
Calcium: 9.3 mg/dL (ref 8.4–10.5)
Chloride: 105 mEq/L (ref 96–112)
Creatinine, Ser: 1.02 mg/dL (ref 0.50–1.35)
GFR calc Af Amer: 83 mL/min — ABNORMAL LOW (ref >90)
GFR calc non Af Amer: 71 mL/min — ABNORMAL LOW (ref >90)
Glucose, Bld: 110 mg/dL — ABNORMAL HIGH (ref 70–99)
POTASSIUM: 3.6 meq/L — AB (ref 3.7–5.3)
SODIUM: 143 meq/L (ref 137–147)
TOTAL PROTEIN: 6.7 g/dL (ref 6.0–8.3)
Total Bilirubin: 0.7 mg/dL (ref 0.3–1.2)

## 2014-03-27 LAB — URINALYSIS, ROUTINE W REFLEX MICROSCOPIC
BILIRUBIN URINE: NEGATIVE
Glucose, UA: NEGATIVE mg/dL
Hgb urine dipstick: NEGATIVE
KETONES UR: NEGATIVE mg/dL
Leukocytes, UA: NEGATIVE
NITRITE: NEGATIVE
Protein, ur: NEGATIVE mg/dL
UROBILINOGEN UA: 0.2 mg/dL (ref 0.0–1.0)
pH: 6 (ref 5.0–8.0)

## 2014-03-27 MED ORDER — FLEET ENEMA 7-19 GM/118ML RE ENEM
1.0000 | ENEMA | Freq: Once | RECTAL | Status: AC
  Administered 2014-03-27: 1 via RECTAL

## 2014-03-27 MED ORDER — SODIUM CHLORIDE 0.9 % IJ SOLN
INTRAMUSCULAR | Status: AC
  Administered 2014-03-27: 10:00:00
  Filled 2014-03-27: qty 500

## 2014-03-27 MED ORDER — OXYCODONE-ACETAMINOPHEN 5-325 MG PO TABS: 1.0000 | ORAL_TABLET | ORAL | Status: DC | PRN

## 2014-03-27 MED ORDER — KETOROLAC TROMETHAMINE 30 MG/ML IJ SOLN
15.0000 mg | Freq: Once | INTRAMUSCULAR | Status: AC
  Administered 2014-03-27: 15 mg via INTRAVENOUS
  Filled 2014-03-27: qty 1

## 2014-03-27 MED ORDER — HYDROMORPHONE HCL PF 1 MG/ML IJ SOLN
1.0000 mg | Freq: Once | INTRAMUSCULAR | Status: AC
  Administered 2014-03-27: 1 mg via INTRAVENOUS
  Filled 2014-03-27: qty 1

## 2014-03-27 MED ORDER — SULFAMETHOXAZOLE-TMP DS 800-160 MG PO TABS: 1.0000 | ORAL_TABLET | Freq: Two times a day (BID) | ORAL | Status: DC

## 2014-03-27 MED ORDER — PROMETHAZINE HCL 25 MG/ML IJ SOLN
12.5000 mg | Freq: Once | INTRAMUSCULAR | Status: AC
  Administered 2014-03-27: 12.5 mg via INTRAVENOUS
  Filled 2014-03-27: qty 1

## 2014-03-27 MED ORDER — ONDANSETRON HCL 4 MG/2ML IJ SOLN
4.0000 mg | Freq: Once | INTRAMUSCULAR | Status: AC
  Administered 2014-03-27: 4 mg via INTRAVENOUS
  Filled 2014-03-27: qty 2

## 2014-03-27 MED ORDER — SODIUM CHLORIDE 0.9 % IV SOLN
INTRAVENOUS | Status: DC
  Administered 2014-03-27: 09:00:00 via INTRAVENOUS

## 2014-03-27 NOTE — Discharge Instructions (Signed)
Drink plenty of fluids. Use Colace and MiraLax for constipation. Call the urologist for a followup appointment in 5-6 days.   Epididymitis Epididymitis is a swelling (inflammation) of the epididymis. The epididymis is a cord-like structure along the back part of the testicle. Epididymitis is usually, but not always, caused by infection. This is usually a sudden problem beginning with chills, fever and pain behind the scrotum and in the testicle. There may be swelling and redness of the testicle. DIAGNOSIS  Physical examination will reveal a tender, swollen epididymis. Sometimes, cultures are obtained from the urine or from prostate secretions to help find out if there is an infection or if the cause is a different problem. Sometimes, blood work is performed to see if your white blood cell count is elevated and if a germ (bacterial) or viral infection is present. Using this knowledge, an appropriate medicine which kills germs (antibiotic) can be chosen by your caregiver. A viral infection causing epididymitis will most often go away (resolve) without treatment. HOME CARE INSTRUCTIONS   Hot sitz baths for 20 minutes, 4 times per day, may help relieve pain.  Only take over-the-counter or prescription medicines for pain, discomfort or fever as directed by your caregiver.  Take all medicines, including antibiotics, as directed. Take the antibiotics for the full prescribed length of time even if you are feeling better.  It is very important to keep all follow-up appointments. SEEK IMMEDIATE MEDICAL CARE IF:   You have a fever.  You have pain not relieved with medicines.  You have any worsening of your problems.  Your pain seems to come and go.  You develop pain, redness, and swelling in the scrotum and surrounding areas. MAKE SURE YOU:   Understand these instructions.  Will watch your condition.  Will get help right away if you are not doing well or get worse. Document Released:  11/02/2000 Document Revised: 01/28/2012 Document Reviewed: 09/22/2009 [facility] Patient Information 2014 [place], [place].

## 2014-03-27 NOTE — ED Notes (Signed)
MD at bedside. 

## 2014-03-27 NOTE — ED Notes (Signed)
Moderate results from 750cc soap suds enema.

## 2014-03-27 NOTE — ED Notes (Signed)
PT d/c to home NAD.

## 2014-03-27 NOTE — ED Provider Notes (Signed)
  Face-to-face evaluation   History: He has had left flank pain with urinary urgency and frequency for several weeks, and has been treated with antibiotics, without improvement. His PCP thought that he had prostatitis. He has never had kidney stones. He does not have chronic back pain. In emergency department, he has been treated with IV analgesia, without relief. He continues to have a sensation of urinary urgency, without hesitancy or dysuria. He has abdominal pain that radiates to the left testicle, and occurs in a colicky fashion. He denies fever, chills, back, pain, weakness, or dizziness. No leg pain, with walking. He has had decreased stooling, and feels constipated and bloated for several days.  Physical exam: Elderly man, alert, cooperative. Abdomen soft. Mild left lower quadrant tenderness without rebound tenderness. Genitals-normal penis and scrotum. Left testicle slightly tender, but no enlargement. No scrotal mass or palpable hernia. Prostate exam- not enlarged, not boggy, not tender; no nodules felt  3:26 PM Reevaluation with update and discussion. After initial assessment and treatment, an updated evaluation reveals After enema X 3,  he has had BMs. Richarda Blade  16:00- I discussed the case with urology, Dr. Tresa Moore, he feels that the patient has epididymal orchitis. The patient can be treated as an outpatient, with Septra. Discuss with patient and family members, all questions answered.   Medical screening examination/treatment/procedure(s) were conducted as a shared visit with non-physician practitioner(s) and myself.  I personally evaluated the patient during the encounter  Richarda Blade, MD 03/27/14 [phone]

## 2014-03-27 NOTE — ED Notes (Signed)
Pt c/o left flank pain worsening over the past few days. Dr. Gabriel Carina sent pt to ER for eval. Pt wife at bedside.

## 2014-03-27 NOTE — ED Provider Notes (Signed)
CSN: [id number]     Arrival date & time 03/27/14  0826 History  This chart was scribed for non-physician practitioner, Debroah Baller, NP-C working with Richarda Blade, MD by Einar Pheasant, ED scribe. This patient was seen in room APA19/APA19 and the patient's care was started at 8:45 AM.     Chief Complaint  Patient presents with  . Flank Pain   The history is provided by the patient. No language interpreter was used.   HPI Comments: [name] is a 73 y.o. male who presents to the Emergency Department complaining of left flank pain that started 3 weeks ago. He states that he has a history of prostate problems and that's what he thought was causing his left sided flank pain. Pt states that his PCP prescribed him prednisone, cipro (which he's been taking for 3 weeks), and hydrocodone. He states that his pain has been waxing and waning. Pt describes the pain as "throbbing" and radiating down his left groin into his testicle. He states that the pain is not exacerbated by movement but has kept him from going about his normal daily activities. He also states that he's been experiencing increase urinary urgency and frequency that causes him to get up multiple times during the night. He reports stable PSA levels followed by Dr. Rosana Hoes (urology). His next appointment is in June. Denies any fever, chills, emesis, or joint swelling.   Past Medical History  Diagnosis Date  . Barrett's esophagus     short-segment, diagnosed in 2007 by Dr. [name]  . GERD (gastroesophageal reflux disease)   . HTN (hypertension)   . BPH (benign prostatic hyperplasia)   . Vertigo   . S/P colonoscopy 2007    unremarkable  . S/P endoscopy Oct 2011     Salmon-colored epithelium coming up to 37 cm from the   . Hiatal hernia     small   Past Surgical History  Procedure Laterality Date  . Appendectomy    . Cholecystectomy    . Rotator cuff repair    . Esophagogastroduodenoscopy  09/15/2010    Dr. [name]- Salmon-colored  epithelium coming up to 37 cm from the   . Colonoscopy  02/06/2006    Dr. [name]- single anal papilla, o/w normal rectum, normal colon  . Esophagogastroduodenoscopy N/A 07/02/2013    Dr. [name]- Barretts on bx. hiatal hernia   Family History  Problem Relation Age of Onset  . Prostate cancer Father     deceased  . Stroke Mother     deceased  . Colon cancer Neg Hx    History  Substance Use Topics  . Smoking status: Former Smoker -- 1.50 packs/day for 30 years    Types: Cigarettes    Quit date: 11/18/1993  . Smokeless tobacco: Not on file     Comment: quit in the 90's  . Alcohol Use: No    Review of Systems  Constitutional: Negative for fever and chills.  HENT: Negative.   Gastrointestinal: Positive for nausea and abdominal pain. Negative for vomiting.  Genitourinary: Positive for urgency and frequency.  Musculoskeletal: Positive for back pain (left flank pain). Negative for joint swelling.  Skin: Negative for rash.  Neurological: Negative for syncope.  Psychiatric/Behavioral: Negative for confusion. The patient is not nervous/anxious.   All other systems reviewed and are negative.     Allergies  Review of patient's allergies indicates no known allergies.  Home Medications   Prior to Admission medications   Medication Sig Start Date End Date  Taking? Authorizing Provider  amitriptyline (ELAVIL) 10 MG tablet Take 5 mg by mouth daily as needed. depression 06/05/12   Historical Provider, MD  Fexofenadine HCl (ALLEGRA PO) Take 1 tablet by mouth daily.    Historical Provider, MD  ibuprofen (ADVIL,MOTRIN) 200 MG tablet Take 600 mg by mouth daily as needed for pain.    Historical Provider, MD  pantoprazole (PROTONIX) 40 MG tablet TAKE ONE TABLET BY MOUTH TWICE A DAY 12/28/13   Mahala Menghini, PA-C  Tamsulosin HCl (FLOMAX) 0.4 MG CAPS Take 0.4 mg by mouth 2 (two) times daily.  05/10/11   Historical Provider, MD   BP 166/89  Pulse 51  Temp(Src) 97.6 F (36.4 C) (Oral)  Resp 20  Ht  6' (1.829 m)  Wt 214 lb (97.07 kg)  BMI 29.02 kg/m2  SpO2 100%  Physical Exam  Nursing note and vitals reviewed. Constitutional: He is oriented to person, place, and time. He appears well-developed and well-nourished.  HENT:  Head: Normocephalic and atraumatic.  Right Ear: External ear normal.  Left Ear: External ear normal.  Eyes: Conjunctivae and EOM are normal. Pupils are equal, round, and reactive to light.  Neck: Normal range of motion and phonation normal. Neck supple.  Cardiovascular: Normal rate, regular rhythm, normal heart sounds and intact distal pulses.   Pulmonary/Chest: Effort normal and breath sounds normal. He exhibits no bony tenderness.  Abdominal: Soft. Bowel sounds are normal. There is no tenderness.  Unable to reproduce the pain the patient has experienced.  Musculoskeletal: Normal range of motion.  Neurological: He is alert and oriented to person, place, and time. No cranial nerve deficit or sensory deficit. He exhibits normal muscle tone. Coordination normal.  Skin: Skin is warm, dry and intact.  Psychiatric: He has a normal mood and affect. His behavior is normal. Judgment and thought content normal.    ED Course  Procedures (including critical care time) Dr. Eulis Foster in to examine the patient, including genital exam.  IV fluids, pain management, medication for nausea, labs, CT  DIAGNOSTIC STUDIES: Oxygen Saturation is 100% on RA, normal by my interpretation.    COORDINATION OF CARE: Medications  0.9 %  sodium chloride infusion ( Intravenous New Bag/Given 03/27/14 0923)  ondansetron (ZOFRAN) injection 4 mg (4 mg Intravenous Given 03/27/14 0924)  ketorolac (TORADOL) 30 MG/ML injection 15 mg (15 mg Intravenous Given 03/27/14 0924)  sodium chloride 0.9 % injection (  Given by Other 03/27/14 0939)    Patient Vitals for the past 24 hrs:  BP Temp Temp src Pulse Resp SpO2 Height Weight  03/27/14 0836 166/89 mmHg 97.6 F (36.4 C) Oral 51 20 100 % 6' (1.829 m) 214 lb  (97.07 kg)   8:58 AM- Will order labs and CT scan . Pt advised of plan for treatment and pt agrees. Results for orders placed during the hospital encounter of 03/27/14 (from the past 24 hour(s))  CBC WITH DIFFERENTIAL     Status: None   Collection Time    03/27/14  9:11 AM      Result Value Ref Range   WBC 8.1  4.0 - 10.5 K/uL   RBC 4.75  4.22 - 5.81 MIL/uL   Hemoglobin 15.4  13.0 - 17.0 g/dL   HCT 43.2  39.0 - 52.0 %   MCV 90.9  78.0 - 100.0 fL   MCH 32.4  26.0 - 34.0 pg   MCHC 35.6  30.0 - 36.0 g/dL   RDW 12.9  11.5 - 15.5 %  Platelets 176  150 - 400 K/uL   Neutrophils Relative % 69  43 - 77 %   Neutro Abs 5.6  1.7 - 7.7 K/uL   Lymphocytes Relative 20  12 - 46 %   Lymphs Abs 1.6  0.7 - 4.0 K/uL   Monocytes Relative 10  3 - 12 %   Monocytes Absolute 0.8  0.1 - 1.0 K/uL   Eosinophils Relative 1  0 - 5 %   Eosinophils Absolute 0.1  0.0 - 0.7 K/uL   Basophils Relative 0  0 - 1 %   Basophils Absolute 0.0  0.0 - 0.1 K/uL  COMPREHENSIVE METABOLIC PANEL     Status: Abnormal   Collection Time    03/27/14  9:11 AM      Result Value Ref Range   Sodium 143  137 - 147 mEq/L   Potassium 3.6 (*) 3.7 - 5.3 mEq/L   Chloride 105  96 - 112 mEq/L   CO2 26  19 - 32 mEq/L   Glucose, Bld 110 (*) 70 - 99 mg/dL   BUN 17  6 - 23 mg/dL   Creatinine, Ser 1.02  0.50 - 1.35 mg/dL   Calcium 9.3  8.4 - 10.5 mg/dL   Total Protein 6.7  6.0 - 8.3 g/dL   Albumin 4.0  3.5 - 5.2 g/dL   AST 15  0 - 37 U/L   ALT 16  0 - 53 U/L   Alkaline Phosphatase 56  39 - 117 U/L   Total Bilirubin 0.7  0.3 - 1.2 mg/dL   GFR calc non Af Amer 71 (*) >90 mL/min   GFR calc Af Amer 83 (*) >90 mL/min  URINALYSIS, ROUTINE W REFLEX MICROSCOPIC     Status: Abnormal   Collection Time    03/27/14  9:26 AM      Result Value Ref Range   Color, Urine YELLOW  YELLOW   APPearance CLEAR  CLEAR   Specific Gravity, Urine <1.005 (*) 1.005 - 1.030   pH 6.0  5.0 - 8.0   Glucose, UA NEGATIVE  NEGATIVE mg/dL   Hgb urine dipstick  NEGATIVE  NEGATIVE   Bilirubin Urine NEGATIVE  NEGATIVE   Ketones, ur NEGATIVE  NEGATIVE mg/dL   Protein, ur NEGATIVE  NEGATIVE mg/dL   Urobilinogen, UA 0.2  0.0 - 1.0 mg/dL   Nitrite NEGATIVE  NEGATIVE   Leukocytes, UA NEGATIVE  NEGATIVE    Ct Abdomen Pelvis Wo Contrast  03/27/2014   CLINICAL DATA:  FLANK PAIN FLANK PAIN  EXAM: CT ABDOMEN AND PELVIS WITHOUT CONTRAST  TECHNIQUE: Multidetector CT imaging of the abdomen and pelvis was performed following the standard protocol without IV contrast.  COMPARISON:  CT PELVIS W/O CM dated 09/29/2012  FINDINGS: Focal area of bullous disease medial right lung base. Lung bases otherwise unremarkable.  Noncontrast evaluation of the liver, spleen, adrenals, pancreas is unremarkable. Patient is status post cholecystectomy.  A stable horseshoe kidney is appreciated with minimal central isthmus connection of the lower poles. A stable 7 mm medullary calculus midpole right kidney. Stable benign appearing cysts right and left kidneys. No hydronephrosis, hydroureter no ureteral lithiasis.  Mild diverticulosis within the sigmoid colon. The bowel is otherwise negative.  No abdominal or pelvic masses, free fluid, loculated fluid collections, no adenopathy within the limitations of a noncontrast CT.  There is no evidence of abdominal aortic aneurysm. Coarse mural calcifications within the aorta and iliac vessels.  No abdominal nor inguinal hernia.  The no  aggressive appearing osseous lesions. Mild areas of spondylosis within the spine.  IMPRESSION: Stable horseshoe kidney.  Stable nonobstructing calculus right renal moiety. No evidence of obstructive uropathy.  Stable bilateral renal cysts  Otherwise known further focal or acute abnormalities.   Electronically Signed   By: Margaree Mackintosh M.D.   On: 03/27/2014 10:08   After 2 fleet enemas the patient was only able to have a small hard stool. Will give soap suds enema.  Patient with results after soap suds.   MDM  73 y.o. male  with left flank pain that radiates to the left inguinal area. Dr. Eulis Foster assumed care of the patient and consulted the Urologist. He will make disposition and discuss plan of care with the patient.   I personally performed the services described in this documentation, which was scribed in my presence. The recorded information has been reviewed and is accurate.    [place], [place] 03/27/14 [phone]

## 2014-03-27 NOTE — ED Notes (Signed)
Complain of pain in left flank area. States he went to his doctor the first of the week and was treated for a pulled muscle.

## 2014-03-27 NOTE — ED Notes (Signed)
Very small amount of small round stool noted.

## 2014-04-02 ENCOUNTER — Ambulatory Visit (INDEPENDENT_AMBULATORY_CARE_PROVIDER_SITE_OTHER): Payer: Medicare Other | Admitting: Urology

## 2014-04-02 DIAGNOSIS — N138 Other obstructive and reflux uropathy: Secondary | ICD-10-CM | POA: Diagnosis not present

## 2014-04-02 DIAGNOSIS — R972 Elevated prostate specific antigen [PSA]: Secondary | ICD-10-CM | POA: Diagnosis not present

## 2014-04-02 DIAGNOSIS — N401 Enlarged prostate with lower urinary tract symptoms: Secondary | ICD-10-CM | POA: Diagnosis not present

## 2014-04-02 DIAGNOSIS — N200 Calculus of kidney: Secondary | ICD-10-CM | POA: Diagnosis not present

## 2014-04-02 DIAGNOSIS — N509 Disorder of male genital organs, unspecified: Secondary | ICD-10-CM | POA: Diagnosis not present

## 2014-05-14 ENCOUNTER — Emergency Department (HOSPITAL_COMMUNITY): Payer: Medicare Other

## 2014-05-14 ENCOUNTER — Ambulatory Visit (HOSPITAL_COMMUNITY)
Admission: RE | Admit: 2014-05-14 | Discharge: 2014-05-14 | Disposition: A | Discharge status: Home / Self Care | Payer: Medicare Other | Source: Ambulatory Visit | Attending: Internal Medicine | Admitting: Internal Medicine

## 2014-05-14 ENCOUNTER — Emergency Department (HOSPITAL_COMMUNITY)
Admission: EM | Admit: 2014-05-14 | Discharge: 2014-05-14 | Disposition: A | Discharge status: Home / Self Care | Payer: Medicare Other | Attending: Emergency Medicine | Admitting: Emergency Medicine

## 2014-05-14 ENCOUNTER — Encounter: Payer: Self-pay | Admitting: Internal Medicine

## 2014-05-14 ENCOUNTER — Ambulatory Visit (INDEPENDENT_AMBULATORY_CARE_PROVIDER_SITE_OTHER): Payer: Medicare Other | Admitting: Internal Medicine

## 2014-05-14 ENCOUNTER — Encounter (INDEPENDENT_AMBULATORY_CARE_PROVIDER_SITE_OTHER): Payer: Self-pay

## 2014-05-14 ENCOUNTER — Other Ambulatory Visit: Payer: Self-pay | Admitting: Internal Medicine

## 2014-05-14 ENCOUNTER — Encounter (HOSPITAL_COMMUNITY): Payer: Self-pay | Admitting: Emergency Medicine

## 2014-05-14 VITALS — BP 139/70 | HR 55 | Temp 97.9°F | Ht 72.0 in | Wt 209.8 lb

## 2014-05-14 DIAGNOSIS — N433 Hydrocele, unspecified: Secondary | ICD-10-CM | POA: Insufficient documentation

## 2014-05-14 DIAGNOSIS — R109 Unspecified abdominal pain: Secondary | ICD-10-CM | POA: Diagnosis not present

## 2014-05-14 DIAGNOSIS — N4400 Torsion of testis, unspecified: Secondary | ICD-10-CM

## 2014-05-14 DIAGNOSIS — Z79899 Other long term (current) drug therapy: Secondary | ICD-10-CM | POA: Insufficient documentation

## 2014-05-14 DIAGNOSIS — I10 Essential (primary) hypertension: Secondary | ICD-10-CM | POA: Insufficient documentation

## 2014-05-14 DIAGNOSIS — Z87891 Personal history of nicotine dependence: Secondary | ICD-10-CM | POA: Insufficient documentation

## 2014-05-14 DIAGNOSIS — Z791 Long term (current) use of non-steroidal anti-inflammatories (NSAID): Secondary | ICD-10-CM | POA: Diagnosis not present

## 2014-05-14 DIAGNOSIS — N200 Calculus of kidney: Secondary | ICD-10-CM | POA: Diagnosis not present

## 2014-05-14 DIAGNOSIS — Z9889 Other specified postprocedural states: Secondary | ICD-10-CM | POA: Insufficient documentation

## 2014-05-14 DIAGNOSIS — R112 Nausea with vomiting, unspecified: Secondary | ICD-10-CM | POA: Diagnosis not present

## 2014-05-14 DIAGNOSIS — N509 Disorder of male genital organs, unspecified: Secondary | ICD-10-CM | POA: Insufficient documentation

## 2014-05-14 DIAGNOSIS — K219 Gastro-esophageal reflux disease without esophagitis: Secondary | ICD-10-CM | POA: Diagnosis not present

## 2014-05-14 DIAGNOSIS — R1012 Left upper quadrant pain: Secondary | ICD-10-CM | POA: Diagnosis not present

## 2014-05-14 DIAGNOSIS — K2270 Barrett's esophagus without dysplasia: Secondary | ICD-10-CM

## 2014-05-14 DIAGNOSIS — R1032 Left lower quadrant pain: Secondary | ICD-10-CM | POA: Insufficient documentation

## 2014-05-14 DIAGNOSIS — Z9089 Acquired absence of other organs: Secondary | ICD-10-CM | POA: Insufficient documentation

## 2014-05-14 DIAGNOSIS — N508 Other specified disorders of male genital organs: Secondary | ICD-10-CM | POA: Diagnosis not present

## 2014-05-14 DIAGNOSIS — N400 Enlarged prostate without lower urinary tract symptoms: Secondary | ICD-10-CM | POA: Diagnosis not present

## 2014-05-14 LAB — CBC WITH DIFFERENTIAL/PLATELET
BASOS ABS: 0 10*3/uL (ref 0.0–0.1)
Basophils Relative: 0 % (ref 0–1)
EOS PCT: 1 % (ref 0–5)
Eosinophils Absolute: 0.1 10*3/uL (ref 0.0–0.7)
HEMATOCRIT: 43.4 % (ref 39.0–52.0)
Hemoglobin: 15.6 g/dL (ref 13.0–17.0)
LYMPHS PCT: 14 % (ref 12–46)
Lymphs Abs: 0.8 10*3/uL (ref 0.7–4.0)
MCH: 32.6 pg (ref 26.0–34.0)
MCHC: 35.9 g/dL (ref 30.0–36.0)
MCV: 90.8 fL (ref 78.0–100.0)
MONO ABS: 0.5 10*3/uL (ref 0.1–1.0)
Monocytes Relative: 8 % (ref 3–12)
Neutro Abs: 4.7 10*3/uL (ref 1.7–7.7)
Neutrophils Relative %: 77 % (ref 43–77)
Platelets: 161 10*3/uL (ref 150–400)
RBC: 4.78 MIL/uL (ref 4.22–5.81)
RDW: 13.5 % (ref 11.5–15.5)
WBC: 6.1 10*3/uL (ref 4.0–10.5)

## 2014-05-14 LAB — COMPREHENSIVE METABOLIC PANEL
ALT: 18 U/L (ref 0–53)
AST: 19 U/L (ref 0–37)
Albumin: 4.2 g/dL (ref 3.5–5.2)
Alkaline Phosphatase: 59 U/L (ref 39–117)
BUN: 14 mg/dL (ref 6–23)
CALCIUM: 9.6 mg/dL (ref 8.4–10.5)
CO2: 27 meq/L (ref 19–32)
CREATININE: 1.01 mg/dL (ref 0.50–1.35)
Chloride: 105 mEq/L (ref 96–112)
GFR, EST AFRICAN AMERICAN: 84 mL/min — AB (ref >90)
GFR, EST NON AFRICAN AMERICAN: 72 mL/min — AB (ref >90)
GLUCOSE: 104 mg/dL — AB (ref 70–99)
Potassium: 3.9 mEq/L (ref 3.7–5.3)
Sodium: 144 mEq/L (ref 137–147)
Total Bilirubin: 1.1 mg/dL (ref 0.3–1.2)
Total Protein: 6.9 g/dL (ref 6.0–8.3)

## 2014-05-14 LAB — URINE MICROSCOPIC-ADD ON

## 2014-05-14 LAB — URINALYSIS, ROUTINE W REFLEX MICROSCOPIC
Bilirubin Urine: NEGATIVE
Glucose, UA: NEGATIVE mg/dL
KETONES UR: 15 mg/dL — AB
LEUKOCYTES UA: NEGATIVE
NITRITE: NEGATIVE
PROTEIN: NEGATIVE mg/dL
Specific Gravity, Urine: 1.015 (ref 1.005–1.030)
UROBILINOGEN UA: 0.2 mg/dL (ref 0.0–1.0)
pH: 6 (ref 5.0–8.0)

## 2014-05-14 LAB — LIPASE, BLOOD: LIPASE: 24 U/L (ref 11–59)

## 2014-05-14 LAB — AMYLASE: AMYLASE: 44 U/L (ref 0–105)

## 2014-05-14 MED ORDER — ONDANSETRON HCL 4 MG/2ML IJ SOLN
4.0000 mg | Freq: Once | INTRAMUSCULAR | Status: AC
  Administered 2014-05-14: 4 mg via INTRAVENOUS

## 2014-05-14 MED ORDER — TRAMADOL HCL 50 MG PO TABS: 50.0000 mg | ORAL_TABLET | Freq: Four times a day (QID) | ORAL | Status: DC | PRN

## 2014-05-14 MED ORDER — IOHEXOL 300 MG/ML  SOLN
50.0000 mL | Freq: Once | INTRAMUSCULAR | Status: AC | PRN
  Administered 2014-05-14: 50 mL via ORAL

## 2014-05-14 MED ORDER — IOHEXOL 300 MG/ML  SOLN
100.0000 mL | Freq: Once | INTRAMUSCULAR | Status: AC | PRN
  Administered 2014-05-14: 100 mL via INTRAVENOUS

## 2014-05-14 MED ORDER — CIPROFLOXACIN HCL 500 MG PO TABS: 500.0000 mg | ORAL_TABLET | Freq: Two times a day (BID) | ORAL | Status: DC

## 2014-05-14 MED ORDER — ONDANSETRON HCL 4 MG/2ML IJ SOLN
INTRAMUSCULAR | Status: AC
  Filled 2014-05-14: qty 2

## 2014-05-14 MED ORDER — SODIUM CHLORIDE 0.9 % IV BOLUS (SEPSIS)
1000.0000 mL | Freq: Once | INTRAVENOUS | Status: AC
Start: 2014-05-14 — End: 2014-05-14
  Administered 2014-05-14: 1000 mL via INTRAVENOUS

## 2014-05-14 NOTE — ED Notes (Signed)
Lt  flank pain for 1 month , has been seen here for similar sx,  Had U/S done today and advised by Dr [name] to come to ER for eval and ct of abd.  Vomiting

## 2014-05-14 NOTE — ED Provider Notes (Signed)
CSN: [id number]     Arrival date & time 05/14/14  1436 History   First MD Initiated Contact with Patient 05/14/14 1554     Chief Complaint  Patient presents with  . Flank Pain     (Consider location/radiation/quality/duration/timing/severity/associated sxs/prior Treatment) HPI Patient presents with concern ongoing left lateral abdominal pain. Pain is moderate, radiating from the left lateral abdomen to the left hemiscrotum. Patient has been seen in emergency department once, and with his gastroenterologist once, today for this pain. Patient had ultrasound performed today, with no lesions identified in the scrotum. Patient was referred here for CT scan of the abdomen and pelvis.  Patient denies ongoing diarrhea or bloody stool, does endorse ongoing nausea with vomiting. He denies fevers, chills, chest pain, dyspnea. Minimal relief with OTC medication.   Past Medical History  Diagnosis Date  . Barrett's esophagus     short-segment, diagnosed in 2007 by Dr. [name]  . GERD (gastroesophageal reflux disease)   . HTN (hypertension)   . BPH (benign prostatic hyperplasia)   . Vertigo   . S/P colonoscopy 2007    unremarkable  . S/P endoscopy Oct 2011     Salmon-colored epithelium coming up to 37 cm from the   . Hiatal hernia     small   Past Surgical History  Procedure Laterality Date  . Appendectomy    . Cholecystectomy    . Rotator cuff repair    . Esophagogastroduodenoscopy  09/15/2010    Dr. [name]- Salmon-colored epithelium coming up to 37 cm from the   . Colonoscopy  02/06/2006    Dr. [name]- single anal papilla, o/w normal rectum, normal colon  . Esophagogastroduodenoscopy N/A 07/02/2013    Dr. [name]- Barretts on bx. hiatal hernia   Family History  Problem Relation Age of Onset  . Prostate cancer Father     deceased  . Stroke Mother     deceased  . Colon cancer Neg Hx    History  Substance Use Topics  . Smoking status: Former Smoker -- 1.50 packs/day for 30 years     Types: Cigarettes    Quit date: 11/18/1993  . Smokeless tobacco: Not on file     Comment: quit in the 90's  . Alcohol Use: No    Review of Systems  Constitutional:       Per HPI, otherwise negative  HENT:       Per HPI, otherwise negative  Respiratory:       Per HPI, otherwise negative  Cardiovascular:       Per HPI, otherwise negative  Gastrointestinal: Positive for nausea and vomiting.  Endocrine:       Negative aside from HPI  Genitourinary:       Neg aside from HPI   Musculoskeletal:       Per HPI, otherwise negative  Skin: Negative.   Neurological: Negative for syncope.      Allergies  Review of patient's allergies indicates no known allergies.  Home Medications   Prior to Admission medications   Medication Sig Start Date End Date Taking? Authorizing Provider  fexofenadine (ALLEGRA) 180 MG tablet Take 180 mg by mouth every morning.   Yes Historical Provider, MD  ibuprofen (ADVIL,MOTRIN) 200 MG tablet Take 600 mg by mouth daily as needed for mild pain or moderate pain.    Yes Historical Provider, MD  pantoprazole (PROTONIX) 40 MG tablet Take 40 mg by mouth 2 (two) times daily.   Yes Historical Provider, MD  Tamsulosin HCl (FLOMAX)  0.4 MG CAPS Take 0.4 mg by mouth 2 (two) times daily.  05/10/11  Yes Historical Provider, MD   BP 152/85  Pulse 63  Temp(Src) 97.6 F (36.4 C) (Oral)  Resp 17  Ht 6' (1.829 m)  Wt 210 lb (95.255 kg)  BMI 28.47 kg/m2  SpO2 95% Physical Exam  Nursing note and vitals reviewed. Constitutional: He is oriented to person, place, and time. He appears well-developed. No distress.  HENT:  Head: Normocephalic and atraumatic.  Eyes: Conjunctivae and EOM are normal.  Cardiovascular: Normal rate and regular rhythm.   Pulmonary/Chest: Effort normal. No stridor. No respiratory distress.  Abdominal: Normal appearance. He exhibits no distension. There is tenderness in the left upper quadrant and left lower quadrant. There is no rigidity, no  rebound and no guarding.  Musculoskeletal: He exhibits no edema.  Neurological: He is alert and oriented to person, place, and time.  Skin: Skin is warm and dry.  Psychiatric: He has a normal mood and affect.    ED Course  Procedures (including critical care time) Labs Review Labs Reviewed  COMPREHENSIVE METABOLIC PANEL - Abnormal; Notable for the following:    Glucose, Bld 104 (*)    GFR calc non Af Amer 72 (*)    GFR calc Af Amer 84 (*)    All other components within normal limits  URINALYSIS, ROUTINE W REFLEX MICROSCOPIC - Abnormal; Notable for the following:    Hgb urine dipstick TRACE (*)    Ketones, ur 15 (*)    All other components within normal limits  CBC WITH DIFFERENTIAL  AMYLASE  LIPASE, BLOOD  URINE MICROSCOPIC-ADD ON    Imaging Review US Scrotum  05/14/2014   CLINICAL DATA:  LEFT testicular pain question testicular torsion  EXAM: SCROTAL ULTRASOUND  DOPPLER ULTRASOUND OF THE TESTICLES  TECHNIQUE: Complete ultrasound examination of the testicles, epididymis, and other scrotal structures was performed. Color and spectral Doppler ultrasound were also utilized to evaluate blood flow to the testicles.  COMPARISON:  None  FINDINGS: Right testicle  Measurements: 4.8 x 3.1 x 2.8 cm. Normal morphology without mass or calcification. Blood flow present within RIGHT testis on color Doppler imaging.  Left testicle  Measurements: 4.0 x 2.1 x 3.0 cm. Normal morphology without mass or calcification. Blood flow present within LEFT testis on color Doppler imaging.  Right epididymis:  Normal echogenicity.  Small cyst 4 x 4 x 6 mm.  Left epididymis:  Normal echogenicity.  Small cyst 8 x 8 x 8 mm.  Hydrocele:  BILATERAL moderate hydroceles.  Varicocele:  None identified  Pulsed Doppler interrogation of both testes demonstrates low resistance arterial and venous waveforms bilaterally.  IMPRESSION: Normal appearing testes without evidence of testicular torsion.  BILATERAL hydroceles and small  epididymal cysts noted.  Findings called to Dr. [name] on 05/14/2014 at 1405 hr   Electronically Signed   By: Lavonia Dana M.D.   On: 05/14/2014 14:16   Ct Abdomen Pelvis W Contrast  05/14/2014   CLINICAL DATA:  Left flank pain for 1 month with vomiting today.  EXAM: CT ABDOMEN AND PELVIS WITH CONTRAST  TECHNIQUE: Multidetector CT imaging of the abdomen and pelvis was performed using the standard protocol following bolus administration of intravenous contrast.  CONTRAST:  51mL OMNIPAQUE IOHEXOL 300 MG/ML SOLN, 166mL OMNIPAQUE IOHEXOL 300 MG/ML SOLN  COMPARISON:  03/27/2014  FINDINGS: Right lower lobe bulla is unchanged.  Subcentimeter hypodensity in the anterior right hepatic lobe near the portal vein bifurcation is too small to characterize.  The gallbladder is surgically absent. The spleen, adrenal glands, and pancreas are unremarkable. Horseshoe kidney is again noted. Bilateral low-density renal lesions do not appear significantly changed and most likely represent cysts, the largest measuring 4.1 cm in the right lower pole. 7 mm stone in the upper pole the right kidney is unchanged. There is no hydronephrosis. No stones are identified along the course of the ureters, which are nondilated.  Oral contrast is noted in the distal esophagus, which may reflect reflux or esophageal dysmotility. Oral contrast is present in nondilated loops of small bowel. There is no evidence of bowel obstruction. There is colonic diverticulosis without evidence of diverticulitis. The appendix is not visualized, consistent with prior appendectomy.  Bladder is unremarkable. No inguinal hernia is seen. Moderate aortoiliac atherosclerotic calcification is again noted. No free fluid or enlarged lymph nodes are identified. No acute osseous abnormality is identified.  IMPRESSION: 1. No acute abnormality identified in the abdomen or pelvis. 2. Unchanged, nonobstructing right renal calculus. Horseshoe kidney.   Electronically Signed   By: Logan Bores   On: 05/14/2014 17:31   [place] Art/ven Flow Abd Pelv Doppler  05/14/2014   CLINICAL DATA:  LEFT testicular pain question testicular torsion  EXAM: SCROTAL ULTRASOUND  DOPPLER ULTRASOUND OF THE TESTICLES  TECHNIQUE: Complete ultrasound examination of the testicles, epididymis, and other scrotal structures was performed. Color and spectral Doppler ultrasound were also utilized to evaluate blood flow to the testicles.  COMPARISON:  None  FINDINGS: Right testicle  Measurements: 4.8 x 3.1 x 2.8 cm. Normal morphology without mass or calcification. Blood flow present within RIGHT testis on color Doppler imaging.  Left testicle  Measurements: 4.0 x 2.1 x 3.0 cm. Normal morphology without mass or calcification. Blood flow present within LEFT testis on color Doppler imaging.  Right epididymis:  Normal echogenicity.  Small cyst 4 x 4 x 6 mm.  Left epididymis:  Normal echogenicity.  Small cyst 8 x 8 x 8 mm.  Hydrocele:  BILATERAL moderate hydroceles.  Varicocele:  None identified  Pulsed Doppler interrogation of both testes demonstrates low resistance arterial and venous waveforms bilaterally.  IMPRESSION: Normal appearing testes without evidence of testicular torsion.  BILATERAL hydroceles and small epididymal cysts noted.  Findings called to Dr. [name] on 05/14/2014 at 1405 hr   Electronically Signed   By: Lavonia Dana M.D.   On: 05/14/2014 14:16    6:25 PM On exam the patient is awake alert, hemodynamically stable. He and I had a lengthy conversation about all findings, including ultrasound, CT, labs from today. The patient reiterates that he has had prior decrease in pain with antibiotic use. Evaluation today does not demonstrate clear infection, but with a history of prior episodes of prostatitis and/or orchitis, reinitiating antibiotics seems reasonable. Patient can followup with primary care and urology, will do so.   MDM   Final diagnoses:  Flank pain    Patient presents with concerns of ongoing  flank pain.  Pain has been present for at least one month, and intermittently for much longer than that.  Today's evaluation demonstrates little suggestion of new systemic infection, no acute intra-abdominal findings on CT scan, and with the ultrasound from earlier today, there is low suspicion for occult systemic pathology. Patient was discharged in stable condition to follow up with his physicians after initiation of pain management, antibiotic.    Carmin Muskrat, MD 05/14/14 [phone]

## 2014-05-14 NOTE — Patient Instructions (Signed)
Go to the hospital for ultrasound of your testicle to make sure there is not a torsion present.  If negative, you she go to the emergency room for further evaluation of your pain.  You will need further evaluation of blood in your stool but this needs to be done after the cause of your testicular painpain has been determined

## 2014-05-14 NOTE — Discharge Instructions (Signed)
As discussed today's evaluation is largely reassuring, and a specific cause for your pain was not demonstrated.  It is important that you monitor your condition carefully, and do not hesitate to return here for concerning changes in your condition.  Otherwise, please be sure to followup with both your primary care physician and your urologist as soon as possible for appropriate ongoing evaluation.   Flank Pain Flank pain refers to pain that is located on the side of the body between the upper abdomen and the back. The pain may occur over a short period of time (acute) or may be long-term or reoccurring (chronic). It may be mild or severe. Flank pain can be caused by many things. CAUSES  Some of the more common causes of flank pain include:  Muscle strains.   Muscle spasms.   A disease of your spine (vertebral disk disease).   A lung infection (pneumonia).   Fluid around your lungs (pulmonary edema).   A kidney infection.   Kidney stones.   A very painful skin rash caused by the chickenpox virus (shingles).   Gallbladder disease.  [place] care will depend on the cause of your pain. In general,  Rest as directed by your caregiver.  Drink enough fluids to keep your urine clear or pale yellow.  Only take over-the-counter or prescription medicines as directed by your caregiver. Some medicines may help relieve the pain.  Tell your caregiver about any changes in your pain.  Follow up with your caregiver as directed. SEEK IMMEDIATE MEDICAL CARE IF:   Your pain is not controlled with medicine.   You have new or worsening symptoms.  Your pain increases.   You have abdominal pain.   You have shortness of breath.   You have persistent nausea or vomiting.   You have swelling in your abdomen.   You feel faint or pass out.   You have blood in your urine.  You have a fever or persistent symptoms for more than 2-3 days.  You have a  fever and your symptoms suddenly get worse. MAKE SURE YOU:   Understand these instructions.  Will watch your condition.  Will get help right away if you are not doing well or get worse. Document Released: 12/27/2005 Document Revised: 07/30/2012 Document Reviewed: 06/19/2012 [facility] Patient Information 2015 [place], [place]. This information is not intended to replace advice given to you by your health care provider. Make sure you discuss any questions you have with your health care provider.

## 2014-05-14 NOTE — Progress Notes (Signed)
Primary Care Physician:  Leonides Grills, MD Primary Gastroenterologist:  Dr. [name]   HPI:  [name] is a 73 y.o. male here for urgent visit regarding  left testicular left inguinal/flank pain present for about 6 weeks. He's been treated empirically for prostatitis and epididymoorchitis. He's been seen by his PCP, the ED and Dr. Rosana Hoes, his urologist down in [place]. He has not gotten any better after multiple rounds of antibiotics. He had a noncontrast CT of the abdomen and pelvis last month revealingt a nonobstructing right renal calculi, known horseshoe kidney, diverticulosis but no diverticulitis. UA and CBC were normal at that time. He says pain waxes and wanes sometimes gets up to 6/10. Left testicle is particularly tender during this time. No fever or chills. Review noncontrast CT with Dr. Thornton Papas.  No new GI issues. Reflux symptoms well controlled on Protonix.   . Past Medical History  Diagnosis Date  . Barrett's esophagus     short-segment, diagnosed in 2007 by Dr. [name]  . GERD (gastroesophageal reflux disease)   . HTN (hypertension)   . BPH (benign prostatic hyperplasia)   . Vertigo   . S/P colonoscopy 2007    unremarkable  . S/P endoscopy Oct 2011     Salmon-colored epithelium coming up to 37 cm from the   . Hiatal hernia     small    Past Surgical History  Procedure Laterality Date  . Appendectomy    . Cholecystectomy    . Rotator cuff repair    . Esophagogastroduodenoscopy  09/15/2010    Dr. [name]- Salmon-colored epithelium coming up to 37 cm from the   . Colonoscopy  02/06/2006    Dr. [name]- single anal papilla, o/w normal rectum, normal colon  . Esophagogastroduodenoscopy N/A 07/02/2013    Dr. [name]- Barretts on bx. hiatal hernia    Prior to Admission medications   Medication Sig Start Date End Date Taking? Authorizing Provider  Fexofenadine HCl (ALLEGRA PO) Take 1 tablet by mouth daily.   Yes Historical Provider, MD  ibuprofen (ADVIL,MOTRIN) 200  MG tablet Take 600 mg by mouth daily as needed for pain.   Yes Historical Provider, MD  pantoprazole (PROTONIX) 40 MG tablet TAKE ONE TABLET BY MOUTH TWICE A DAY 12/28/13  Yes Mahala Menghini, PA-C  Tamsulosin HCl (FLOMAX) 0.4 MG CAPS Take 0.4 mg by mouth 2 (two) times daily.  05/10/11  Yes Historical Provider, MD  oxyCODONE-acetaminophen (PERCOCET) 5-325 MG per tablet Take 1 tablet by mouth every 4 (four) hours as needed for severe pain. 03/27/14   Richarda Blade, MD  predniSONE (DELTASONE) 5 MG tablet Take 5-30 mg by mouth daily. Dose pack 03/22/14   Historical Provider, MD  sulfamethoxazole-trimethoprim (BACTRIM DS) 800-160 MG per tablet Take 1 tablet by mouth 2 (two) times daily. 03/27/14   Richarda Blade, MD    Allergies as of 05/14/2014  . (No Known Allergies)    Family History  Problem Relation Age of Onset  . Prostate cancer Father     deceased  . Stroke Mother     deceased  . Colon cancer Neg Hx     History   Social History  . Marital Status: Married    Spouse Name: N/A    Number of Children: N/A  . Years of Education: N/A   Occupational History  . Not on file.   Social History Main Topics  . Smoking status: Former Smoker -- 1.50 packs/day for 30 years  Types: Cigarettes    Quit date: 11/18/1993  . Smokeless tobacco: Not on file     Comment: quit in the 90's  . Alcohol Use: No  . Drug Use: No  . Sexual Activity: Not on file   Other Topics Concern  . Not on file   Social History Narrative  . No narrative on file    Review of Systems: See HPI, otherwise negative ROS  Physical Exam: BP 139/70  Pulse 55  Temp(Src) 97.9 F (36.6 C) (Oral)  Ht 6' (1.829 m)  Wt 209 lb 12.8 oz (95.165 kg)  BMI 28.45 kg/m2 General:   Alert,  uncomfortable somewhat diaphoretic gentleman, conversant.  Skin:  Intact without significant lesions or rashes. Eyes:  Sclera clear, no icterus.   Conjunctiva pink. Ears:  Normal auditory acuity. Nose:  No deformity, discharge,  or  lesions. Mouth:  No deformity or lesions. Neck:  Supple; no masses or thyromegaly. No significant cervical adenopathy. Lungs:  Clear throughout to auscultation.   No wheezes, crackles, or rhonchi. No acute distress. Heart:  Regular rate and rhythm; no murmurs, clicks, rubs,  or gallops. Abdomen: Non-distended, normal bowel sounds.  Soft and nontender without appreciable mass or hepatosplenomegaly. . In fact, patient states deep left abdominal palpation/pressure applied actually lessens the pain. No CVA tenderness. Left testicle is tender to palpation; no appreciable hernia. Pulses:  Normal pulses noted. Extremities:  Without clubbing or edema. Rectal: No mass the rectal vault. Prostate nonenlarged and without mass. 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 He has been treated empirically for epididymorchitis and prostatitis with multiple rounds of antibiotics. He apparently has not at all improved. He appears quite uncomfortable with posturing and diaphoresis.  Clinically, symptoms appear to be more urological in origin at this time. His occult blood positive on digital rectal exam today. I doubt this finding has much to do with his acute illness but will warrant further evaluation after the cause of his pain has been fully investigated. He needs further urgent evaluation.  Recommendations:   Go urgently to the radiology department for a testicular ultrasound to rule out the possibility of a testicular torsion in progress.  If negative, I told him it would be best to go to the emergency department for further evaluation. I feel he will ultimately benefit from undergoing a contrast CT of the abdomen and pelvis and /or further evaluation as the ED physician deems appropriate. I explained to him he will need evaluation of Hemoccult-positive stool on an  elective basis.    Notice: This dictation was prepared with Dragon dictation along with smaller phrase technology. Any transcriptional errors that result from this process are unintentional and may not be corrected upon review.

## 2014-05-14 NOTE — Progress Notes (Deleted)
Primary Care Physician:  Leonides Grills, MD Primary Gastroenterologist:  Dr.   Pre-Procedure History & Physical: HPI:  [name] is a 73 y.o. male here for   Past Medical History  Diagnosis Date  . Barrett's esophagus     short-segment, diagnosed in 2007 by Dr. [name]  . GERD (gastroesophageal reflux disease)   . HTN (hypertension)   . BPH (benign prostatic hyperplasia)   . Vertigo   . S/P colonoscopy 2007    unremarkable  . S/P endoscopy Oct 2011     Salmon-colored epithelium coming up to 37 cm from the   . Hiatal hernia     small    Past Surgical History  Procedure Laterality Date  . Appendectomy    . Cholecystectomy    . Rotator cuff repair    . Esophagogastroduodenoscopy  09/15/2010    Dr. [name]- Salmon-colored epithelium coming up to 37 cm from the   . Colonoscopy  02/06/2006    Dr. [name]- single anal papilla, o/w normal rectum, normal colon  . Esophagogastroduodenoscopy N/A 07/02/2013    Dr. [name]- Barretts on bx. hiatal hernia    Prior to Admission medications   Medication Sig Start Date End Date Taking? Authorizing Provider  Fexofenadine HCl (ALLEGRA PO) Take 1 tablet by mouth daily.   Yes Historical Provider, MD  ibuprofen (ADVIL,MOTRIN) 200 MG tablet Take 600 mg by mouth daily as needed for pain.   Yes Historical Provider, MD  pantoprazole (PROTONIX) 40 MG tablet TAKE ONE TABLET BY MOUTH TWICE A DAY 12/28/13  Yes Mahala Menghini, PA-C  Tamsulosin HCl (FLOMAX) 0.4 MG CAPS Take 0.4 mg by mouth 2 (two) times daily.  05/10/11  Yes Historical Provider, MD  oxyCODONE-acetaminophen (PERCOCET) 5-325 MG per tablet Take 1 tablet by mouth every 4 (four) hours as needed for severe pain. 03/27/14   Richarda Blade, MD  predniSONE (DELTASONE) 5 MG tablet Take 5-30 mg by mouth daily. Dose pack 03/22/14   Historical Provider, MD  sulfamethoxazole-trimethoprim (BACTRIM DS) 800-160 MG per tablet Take 1 tablet by mouth 2 (two) times daily. 03/27/14   Richarda Blade, MD     Allergies as of 05/14/2014  . (No Known Allergies)    Family History  Problem Relation Age of Onset  . Prostate cancer Father     deceased  . Stroke Mother     deceased  . Colon cancer Neg Hx     History   Social History  . Marital Status: Married    Spouse Name: N/A    Number of Children: N/A  . Years of Education: N/A   Occupational History  . Not on file.   Social History Main Topics  . Smoking status: Former Smoker -- 1.50 packs/day for 30 years    Types: Cigarettes    Quit date: 11/18/1993  . Smokeless tobacco: Not on file     Comment: quit in the 90's  . Alcohol Use: No  . Drug Use: No  . Sexual Activity: Not on file   Other Topics Concern  . Not on file   Social History Narrative  . No narrative on file    Review of Systems: See HPI, otherwise negative ROS  Physical Exam: BP 139/70  Pulse 55  Temp(Src) 97.9 F (36.6 C) (Oral)  Ht 6' (1.829 m)  Wt 209 lb 12.8 oz (95.165 kg)  BMI 28.45 kg/m2 General:   Alert,  Well-developed, well-nourished, pleasant and cooperative in NAD Skin:  Intact  without significant lesions or rashes. Eyes:  Sclera clear, no icterus.   Conjunctiva pink. Ears:  Normal auditory acuity. Nose:  No deformity, discharge,  or lesions. Mouth:  No deformity or lesions. Neck:  Supple; no masses or thyromegaly. No significant cervical adenopathy. Lungs:  Clear throughout to auscultation.   No wheezes, crackles, or rhonchi. No acute distress. Heart:  Regular rate and rhythm; no murmurs, clicks, rubs,  or gallops. Abdomen: Non-distended, normal bowel sounds.  Soft and nontender without appreciable mass or hepatosplenomegaly.  Pulses:  Normal pulses noted. Extremities:  Without clubbing or edema.  Impression/Plan:  ***     Notice: This dictation was prepared with Dragon dictation along with smaller phrase technology. Any transcriptional errors that result from this process are unintentional and may not be corrected upon  review.

## 2014-05-19 DIAGNOSIS — M545 Low back pain, unspecified: Secondary | ICD-10-CM | POA: Diagnosis not present

## 2014-05-19 DIAGNOSIS — N509 Disorder of male genital organs, unspecified: Secondary | ICD-10-CM | POA: Diagnosis not present

## 2014-05-26 ENCOUNTER — Other Ambulatory Visit: Payer: Self-pay | Admitting: Urology

## 2014-05-26 DIAGNOSIS — M545 Low back pain: Secondary | ICD-10-CM

## 2014-05-26 DIAGNOSIS — N50812 Left testicular pain: Secondary | ICD-10-CM

## 2014-05-27 ENCOUNTER — Ambulatory Visit (HOSPITAL_COMMUNITY)
Admission: RE | Admit: 2014-05-27 | Discharge: 2014-05-27 | Disposition: A | Discharge status: Home / Self Care | Payer: Medicare Other | Source: Ambulatory Visit | Attending: Urology | Admitting: Urology

## 2014-05-27 DIAGNOSIS — M48061 Spinal stenosis, lumbar region without neurogenic claudication: Secondary | ICD-10-CM | POA: Diagnosis not present

## 2014-05-27 DIAGNOSIS — M545 Low back pain, unspecified: Secondary | ICD-10-CM | POA: Diagnosis not present

## 2014-05-27 DIAGNOSIS — M5137 Other intervertebral disc degeneration, lumbosacral region: Secondary | ICD-10-CM | POA: Insufficient documentation

## 2014-05-27 DIAGNOSIS — M47817 Spondylosis without myelopathy or radiculopathy, lumbosacral region: Secondary | ICD-10-CM | POA: Insufficient documentation

## 2014-05-27 DIAGNOSIS — M5126 Other intervertebral disc displacement, lumbar region: Secondary | ICD-10-CM | POA: Insufficient documentation

## 2014-05-27 DIAGNOSIS — M51379 Other intervertebral disc degeneration, lumbosacral region without mention of lumbar back pain or lower extremity pain: Secondary | ICD-10-CM | POA: Insufficient documentation

## 2014-05-27 DIAGNOSIS — N50812 Left testicular pain: Secondary | ICD-10-CM

## 2014-05-28 ENCOUNTER — Ambulatory Visit (INDEPENDENT_AMBULATORY_CARE_PROVIDER_SITE_OTHER): Payer: Medicare Other | Admitting: Urology

## 2014-05-28 DIAGNOSIS — M545 Low back pain, unspecified: Secondary | ICD-10-CM | POA: Diagnosis not present

## 2014-05-28 DIAGNOSIS — N509 Disorder of male genital organs, unspecified: Secondary | ICD-10-CM

## 2014-05-28 DIAGNOSIS — N401 Enlarged prostate with lower urinary tract symptoms: Secondary | ICD-10-CM | POA: Diagnosis not present

## 2014-06-08 DIAGNOSIS — M47817 Spondylosis without myelopathy or radiculopathy, lumbosacral region: Secondary | ICD-10-CM | POA: Diagnosis not present

## 2014-06-15 DIAGNOSIS — M5137 Other intervertebral disc degeneration, lumbosacral region: Secondary | ICD-10-CM | POA: Diagnosis not present

## 2014-06-15 DIAGNOSIS — M47817 Spondylosis without myelopathy or radiculopathy, lumbosacral region: Secondary | ICD-10-CM | POA: Diagnosis not present

## 2014-06-15 DIAGNOSIS — R109 Unspecified abdominal pain: Secondary | ICD-10-CM | POA: Diagnosis not present

## 2014-06-18 DIAGNOSIS — Q638 Other specified congenital malformations of kidney: Secondary | ICD-10-CM | POA: Diagnosis not present

## 2014-06-18 DIAGNOSIS — G8929 Other chronic pain: Secondary | ICD-10-CM | POA: Diagnosis not present

## 2014-06-18 DIAGNOSIS — Z87440 Personal history of urinary (tract) infections: Secondary | ICD-10-CM | POA: Diagnosis not present

## 2014-06-18 DIAGNOSIS — R109 Unspecified abdominal pain: Secondary | ICD-10-CM | POA: Diagnosis not present

## 2014-06-18 DIAGNOSIS — M47817 Spondylosis without myelopathy or radiculopathy, lumbosacral region: Secondary | ICD-10-CM | POA: Diagnosis not present

## 2014-06-18 DIAGNOSIS — K219 Gastro-esophageal reflux disease without esophagitis: Secondary | ICD-10-CM | POA: Diagnosis not present

## 2014-06-18 DIAGNOSIS — Z79899 Other long term (current) drug therapy: Secondary | ICD-10-CM | POA: Diagnosis not present

## 2014-06-18 DIAGNOSIS — M5137 Other intervertebral disc degeneration, lumbosacral region: Secondary | ICD-10-CM | POA: Diagnosis not present

## 2014-06-18 DIAGNOSIS — N400 Enlarged prostate without lower urinary tract symptoms: Secondary | ICD-10-CM | POA: Diagnosis not present

## 2014-07-20 DIAGNOSIS — M5137 Other intervertebral disc degeneration, lumbosacral region: Secondary | ICD-10-CM | POA: Diagnosis not present

## 2014-07-20 DIAGNOSIS — R109 Unspecified abdominal pain: Secondary | ICD-10-CM | POA: Diagnosis not present

## 2014-07-20 DIAGNOSIS — M47817 Spondylosis without myelopathy or radiculopathy, lumbosacral region: Secondary | ICD-10-CM | POA: Diagnosis not present

## 2014-07-30 ENCOUNTER — Ambulatory Visit: Payer: Medicare Other | Admitting: Urology

## 2014-09-03 ENCOUNTER — Ambulatory Visit (INDEPENDENT_AMBULATORY_CARE_PROVIDER_SITE_OTHER): Payer: Medicare Other | Admitting: Urology

## 2014-09-03 DIAGNOSIS — R972 Elevated prostate specific antigen [PSA]: Secondary | ICD-10-CM | POA: Diagnosis not present

## 2014-09-03 DIAGNOSIS — N401 Enlarged prostate with lower urinary tract symptoms: Secondary | ICD-10-CM | POA: Diagnosis not present

## 2014-09-03 DIAGNOSIS — N4340 Spermatocele of epididymis, unspecified: Secondary | ICD-10-CM

## 2014-09-06 ENCOUNTER — Encounter: Payer: Self-pay | Admitting: Internal Medicine

## 2014-09-10 DIAGNOSIS — Z23 Encounter for immunization: Secondary | ICD-10-CM | POA: Diagnosis not present

## 2014-10-05 DIAGNOSIS — M1000 Idiopathic gout, unspecified site: Secondary | ICD-10-CM | POA: Diagnosis not present

## 2014-10-05 DIAGNOSIS — Z6825 Body mass index (BMI) 25.0-25.9, adult: Secondary | ICD-10-CM | POA: Diagnosis not present

## 2014-10-05 DIAGNOSIS — G4700 Insomnia, unspecified: Secondary | ICD-10-CM | POA: Diagnosis not present

## 2014-11-02 DIAGNOSIS — L820 Inflamed seborrheic keratosis: Secondary | ICD-10-CM | POA: Diagnosis not present

## 2014-11-02 DIAGNOSIS — X32XXXA Exposure to sunlight, initial encounter: Secondary | ICD-10-CM | POA: Diagnosis not present

## 2014-11-02 DIAGNOSIS — L570 Actinic keratosis: Secondary | ICD-10-CM | POA: Diagnosis not present

## 2014-11-23 DIAGNOSIS — H25043 Posterior subcapsular polar age-related cataract, bilateral: Secondary | ICD-10-CM | POA: Diagnosis not present

## 2014-12-20 ENCOUNTER — Ambulatory Visit (HOSPITAL_COMMUNITY)
Admission: RE | Admit: 2014-12-20 | Discharge: 2014-12-20 | Disposition: A | Discharge status: Home / Self Care | Payer: Medicare Other | Source: Ambulatory Visit | Attending: Ophthalmology | Admitting: Ophthalmology

## 2014-12-20 ENCOUNTER — Encounter (HOSPITAL_COMMUNITY)
Admission: RE | Disposition: A | Discharge status: Home / Self Care | Payer: Self-pay | Source: Ambulatory Visit | Attending: Ophthalmology

## 2014-12-20 ENCOUNTER — Encounter (HOSPITAL_COMMUNITY): Payer: Self-pay | Admitting: *Deleted

## 2014-12-20 DIAGNOSIS — J329 Chronic sinusitis, unspecified: Secondary | ICD-10-CM | POA: Diagnosis not present

## 2014-12-20 DIAGNOSIS — H26491 Other secondary cataract, right eye: Secondary | ICD-10-CM | POA: Diagnosis not present

## 2014-12-20 DIAGNOSIS — E663 Overweight: Secondary | ICD-10-CM | POA: Diagnosis not present

## 2014-12-20 DIAGNOSIS — Z6825 Body mass index (BMI) 25.0-25.9, adult: Secondary | ICD-10-CM | POA: Diagnosis not present

## 2014-12-20 DIAGNOSIS — H538 Other visual disturbances: Secondary | ICD-10-CM | POA: Diagnosis not present

## 2014-12-20 HISTORY — PX: YAG LASER APPLICATION: SHX6189

## 2014-12-20 SURGERY — TREATMENT, USING YAG LASER
Anesthesia: LOCAL | Laterality: Right

## 2014-12-20 MED ORDER — CYCLOPENTOLATE-PHENYLEPHRINE 0.2-1 % OP SOLN
1.0000 [drp] | OPHTHALMIC | Status: AC
  Administered 2014-12-20 (×3): 1 [drp] via OPHTHALMIC

## 2014-12-20 MED ORDER — CYCLOPENTOLATE-PHENYLEPHRINE OP SOLN OPTIME - NO CHARGE
OPHTHALMIC | Status: AC
  Filled 2014-12-20: qty 2

## 2014-12-20 MED ORDER — TETRACAINE HCL 0.5 % OP SOLN: 1.0000 [drp] | Freq: Once | OPHTHALMIC | Status: DC

## 2014-12-20 MED ORDER — TETRACAINE HCL 0.5 % OP SOLN
OPHTHALMIC | Status: AC
  Filled 2014-12-20: qty 2

## 2014-12-20 MED ORDER — APRACLONIDINE HCL 1 % OP SOLN
1.0000 [drp] | OPHTHALMIC | Status: AC
  Administered 2014-12-20 (×3): 1 [drp] via OPHTHALMIC

## 2014-12-20 MED ORDER — CYCLOPENTOLATE HCL 1 % OP SOLN: 1.0000 [drp] | OPHTHALMIC | Status: DC

## 2014-12-20 MED ORDER — APRACLONIDINE HCL 1 % OP SOLN
OPHTHALMIC | Status: AC
  Filled 2014-12-20: qty 0.1

## 2014-12-20 NOTE — Discharge Instructions (Signed)
[name]  12/20/2014     Instructions    Activity: No Restrictions.   Diet: Resume Diet you were on at home.   Pain Medication: Tylenol if Needed.   CONTACT YOUR DOCTOR IF YOU HAVE PAIN, REDNESS IN YOUR EYE, OR DECREASED VISION.   Follow-up:01/11/2015 @ 10:30am with [name], MD.     Dr. Iona Hansen: [phone]     If you find that you cannot contact your physician, but feel that your signs and   Symptoms warrant a physician's attention, call the Emergency Room at   [phone] ext.[phone].

## 2014-12-20 NOTE — H&P (Signed)
I have reviewed the pre printed H&P, the patient was re-examined, and I have identified no significant interval changes in the patient's medical condition.  There is no change in the plan of care since the history and physical of record. 

## 2014-12-20 NOTE — Brief Op Note (Signed)
[name] 12/20/2014  [name], MD  Pre-op Diagnosis:  secondary cataract right eye  Post-op Diagnosis:  same  Yag laser self-test completed: Yes.    Indications:  See scanned off H&P  Procedure: YAG capsulotomy  right eye  Eye protection worn by staff:  Yes.   Laser In Use sign on door:  Yes.    Laser:  {LUMENIS YAG/SLT LASER Power Setting:  1.5 mJ/burst Anatomical site treated:  Posterior capsule right eye Number of applications:  19 Total energy delivered: 28.83 mJ Results:  Open capsule/clear visual axis/no complications Patient was instructed to go to the office, as previously scheduled, for intraocular pressure:  No.  Patient verbalizes understanding of discharge instructions:  Yes.

## 2014-12-21 ENCOUNTER — Encounter (HOSPITAL_COMMUNITY): Payer: Self-pay | Admitting: Ophthalmology

## 2014-12-31 NOTE — Telephone Encounter (Signed)
Opened in error

## 2015-01-06 ENCOUNTER — Other Ambulatory Visit: Payer: Self-pay | Admitting: Gastroenterology

## 2015-02-21 DIAGNOSIS — Z6825 Body mass index (BMI) 25.0-25.9, adult: Secondary | ICD-10-CM | POA: Diagnosis not present

## 2015-02-21 DIAGNOSIS — M1000 Idiopathic gout, unspecified site: Secondary | ICD-10-CM | POA: Diagnosis not present

## 2015-02-21 DIAGNOSIS — E663 Overweight: Secondary | ICD-10-CM | POA: Diagnosis not present

## 2015-03-01 DIAGNOSIS — R972 Elevated prostate specific antigen [PSA]: Secondary | ICD-10-CM | POA: Diagnosis not present

## 2015-03-07 DIAGNOSIS — M109 Gout, unspecified: Secondary | ICD-10-CM | POA: Diagnosis not present

## 2015-03-07 DIAGNOSIS — M47816 Spondylosis without myelopathy or radiculopathy, lumbar region: Secondary | ICD-10-CM | POA: Diagnosis not present

## 2015-03-11 ENCOUNTER — Ambulatory Visit (INDEPENDENT_AMBULATORY_CARE_PROVIDER_SITE_OTHER): Payer: Medicare Other | Admitting: Urology

## 2015-03-11 DIAGNOSIS — N508 Other specified disorders of male genital organs: Secondary | ICD-10-CM

## 2015-03-11 DIAGNOSIS — N200 Calculus of kidney: Secondary | ICD-10-CM

## 2015-03-11 DIAGNOSIS — R351 Nocturia: Secondary | ICD-10-CM

## 2015-03-11 DIAGNOSIS — R972 Elevated prostate specific antigen [PSA]: Secondary | ICD-10-CM | POA: Diagnosis not present

## 2015-03-11 DIAGNOSIS — N401 Enlarged prostate with lower urinary tract symptoms: Secondary | ICD-10-CM

## 2015-03-11 DIAGNOSIS — N5201 Erectile dysfunction due to arterial insufficiency: Secondary | ICD-10-CM | POA: Diagnosis not present

## 2015-03-17 ENCOUNTER — Ambulatory Visit (INDEPENDENT_AMBULATORY_CARE_PROVIDER_SITE_OTHER): Payer: Medicare Other | Admitting: Otolaryngology

## 2015-04-05 DIAGNOSIS — R1030 Lower abdominal pain, unspecified: Secondary | ICD-10-CM | POA: Diagnosis not present

## 2015-04-05 DIAGNOSIS — M5136 Other intervertebral disc degeneration, lumbar region: Secondary | ICD-10-CM | POA: Diagnosis not present

## 2015-04-21 ENCOUNTER — Ambulatory Visit (INDEPENDENT_AMBULATORY_CARE_PROVIDER_SITE_OTHER): Payer: Medicare Other | Admitting: Otolaryngology

## 2015-04-21 DIAGNOSIS — H903 Sensorineural hearing loss, bilateral: Secondary | ICD-10-CM

## 2015-04-27 DIAGNOSIS — L570 Actinic keratosis: Secondary | ICD-10-CM | POA: Diagnosis not present

## 2015-04-27 DIAGNOSIS — X32XXXD Exposure to sunlight, subsequent encounter: Secondary | ICD-10-CM | POA: Diagnosis not present

## 2015-04-27 DIAGNOSIS — D225 Melanocytic nevi of trunk: Secondary | ICD-10-CM | POA: Diagnosis not present

## 2015-08-02 DIAGNOSIS — M47816 Spondylosis without myelopathy or radiculopathy, lumbar region: Secondary | ICD-10-CM | POA: Diagnosis not present

## 2015-08-29 DIAGNOSIS — G8929 Other chronic pain: Secondary | ICD-10-CM | POA: Diagnosis not present

## 2015-08-29 DIAGNOSIS — M47816 Spondylosis without myelopathy or radiculopathy, lumbar region: Secondary | ICD-10-CM | POA: Diagnosis not present

## 2015-08-29 DIAGNOSIS — Q631 Lobulated, fused and horseshoe kidney: Secondary | ICD-10-CM | POA: Diagnosis not present

## 2015-08-29 DIAGNOSIS — Z79899 Other long term (current) drug therapy: Secondary | ICD-10-CM | POA: Diagnosis not present

## 2015-08-29 DIAGNOSIS — Z87440 Personal history of urinary (tract) infections: Secondary | ICD-10-CM | POA: Diagnosis not present

## 2015-08-29 DIAGNOSIS — M5136 Other intervertebral disc degeneration, lumbar region: Secondary | ICD-10-CM | POA: Diagnosis not present

## 2015-08-29 DIAGNOSIS — K219 Gastro-esophageal reflux disease without esophagitis: Secondary | ICD-10-CM | POA: Diagnosis not present

## 2015-09-05 DIAGNOSIS — Z23 Encounter for immunization: Secondary | ICD-10-CM | POA: Diagnosis not present

## 2015-09-05 DIAGNOSIS — R972 Elevated prostate specific antigen [PSA]: Secondary | ICD-10-CM | POA: Diagnosis not present

## 2015-09-09 ENCOUNTER — Ambulatory Visit (INDEPENDENT_AMBULATORY_CARE_PROVIDER_SITE_OTHER): Payer: Medicare Other | Admitting: Urology

## 2015-09-09 DIAGNOSIS — N401 Enlarged prostate with lower urinary tract symptoms: Secondary | ICD-10-CM | POA: Diagnosis not present

## 2015-09-09 DIAGNOSIS — R972 Elevated prostate specific antigen [PSA]: Secondary | ICD-10-CM | POA: Diagnosis not present

## 2015-09-09 DIAGNOSIS — R351 Nocturia: Secondary | ICD-10-CM

## 2015-09-09 DIAGNOSIS — N200 Calculus of kidney: Secondary | ICD-10-CM | POA: Diagnosis not present

## 2015-09-14 DIAGNOSIS — M5136 Other intervertebral disc degeneration, lumbar region: Secondary | ICD-10-CM | POA: Diagnosis not present

## 2015-09-14 DIAGNOSIS — M4806 Spinal stenosis, lumbar region: Secondary | ICD-10-CM | POA: Diagnosis not present

## 2015-09-26 ENCOUNTER — Other Ambulatory Visit (HOSPITAL_COMMUNITY): Payer: Self-pay | Admitting: Anesthesiology

## 2015-09-26 DIAGNOSIS — M5136 Other intervertebral disc degeneration, lumbar region: Secondary | ICD-10-CM

## 2015-10-06 ENCOUNTER — Ambulatory Visit (HOSPITAL_COMMUNITY)
Admission: RE | Admit: 2015-10-06 | Discharge: 2015-10-06 | Disposition: A | Discharge status: Home / Self Care | Payer: Medicare Other | Source: Ambulatory Visit | Attending: Physician Assistant | Admitting: Physician Assistant

## 2015-10-06 ENCOUNTER — Other Ambulatory Visit (HOSPITAL_COMMUNITY): Payer: Self-pay | Admitting: Physician Assistant

## 2015-10-06 DIAGNOSIS — R0602 Shortness of breath: Secondary | ICD-10-CM | POA: Diagnosis not present

## 2015-10-06 DIAGNOSIS — E785 Hyperlipidemia, unspecified: Secondary | ICD-10-CM | POA: Diagnosis not present

## 2015-10-06 DIAGNOSIS — Z0000 Encounter for general adult medical examination without abnormal findings: Secondary | ICD-10-CM | POA: Diagnosis not present

## 2015-10-06 DIAGNOSIS — E663 Overweight: Secondary | ICD-10-CM | POA: Diagnosis not present

## 2015-10-06 DIAGNOSIS — Z87891 Personal history of nicotine dependence: Secondary | ICD-10-CM | POA: Insufficient documentation

## 2015-10-06 DIAGNOSIS — R0600 Dyspnea, unspecified: Secondary | ICD-10-CM | POA: Diagnosis not present

## 2015-10-06 DIAGNOSIS — I509 Heart failure, unspecified: Secondary | ICD-10-CM | POA: Diagnosis not present

## 2015-10-06 DIAGNOSIS — Z0001 Encounter for general adult medical examination with abnormal findings: Secondary | ICD-10-CM | POA: Diagnosis not present

## 2015-10-06 DIAGNOSIS — Z6825 Body mass index (BMI) 25.0-25.9, adult: Secondary | ICD-10-CM | POA: Diagnosis not present

## 2015-10-06 DIAGNOSIS — R5383 Other fatigue: Secondary | ICD-10-CM | POA: Diagnosis not present

## 2015-10-06 DIAGNOSIS — Z1389 Encounter for screening for other disorder: Secondary | ICD-10-CM | POA: Diagnosis not present

## 2015-10-07 ENCOUNTER — Ambulatory Visit (HOSPITAL_COMMUNITY)
Admission: RE | Admit: 2015-10-07 | Discharge: 2015-10-07 | Disposition: A | Discharge status: Home / Self Care | Payer: Medicare Other | Source: Ambulatory Visit | Attending: Anesthesiology | Admitting: Anesthesiology

## 2015-10-07 ENCOUNTER — Ambulatory Visit (HOSPITAL_COMMUNITY): Payer: Medicare Other

## 2015-10-07 DIAGNOSIS — M5136 Other intervertebral disc degeneration, lumbar region: Secondary | ICD-10-CM | POA: Diagnosis not present

## 2015-10-07 DIAGNOSIS — M4806 Spinal stenosis, lumbar region: Secondary | ICD-10-CM | POA: Diagnosis not present

## 2015-10-07 DIAGNOSIS — M47896 Other spondylosis, lumbar region: Secondary | ICD-10-CM | POA: Diagnosis not present

## 2015-10-07 DIAGNOSIS — M5126 Other intervertebral disc displacement, lumbar region: Secondary | ICD-10-CM | POA: Diagnosis not present

## 2015-11-08 DIAGNOSIS — M109 Gout, unspecified: Secondary | ICD-10-CM | POA: Diagnosis not present

## 2015-11-08 DIAGNOSIS — M47816 Spondylosis without myelopathy or radiculopathy, lumbar region: Secondary | ICD-10-CM | POA: Diagnosis not present

## 2015-11-23 DIAGNOSIS — M7542 Impingement syndrome of left shoulder: Secondary | ICD-10-CM | POA: Diagnosis not present

## 2015-12-19 DIAGNOSIS — M7542 Impingement syndrome of left shoulder: Secondary | ICD-10-CM | POA: Diagnosis not present

## 2015-12-26 DIAGNOSIS — M7542 Impingement syndrome of left shoulder: Secondary | ICD-10-CM | POA: Diagnosis not present

## 2016-01-03 ENCOUNTER — Encounter: Payer: Self-pay | Admitting: Internal Medicine

## 2016-01-04 DIAGNOSIS — M75112 Incomplete rotator cuff tear or rupture of left shoulder, not specified as traumatic: Secondary | ICD-10-CM | POA: Diagnosis not present

## 2016-01-10 DIAGNOSIS — M1A00X0 Idiopathic chronic gout, unspecified site, without tophus (tophi): Secondary | ICD-10-CM | POA: Diagnosis not present

## 2016-01-10 DIAGNOSIS — M722 Plantar fascial fibromatosis: Secondary | ICD-10-CM | POA: Diagnosis not present

## 2016-01-10 DIAGNOSIS — E663 Overweight: Secondary | ICD-10-CM | POA: Diagnosis not present

## 2016-01-10 DIAGNOSIS — Z1389 Encounter for screening for other disorder: Secondary | ICD-10-CM | POA: Diagnosis not present

## 2016-01-10 DIAGNOSIS — J019 Acute sinusitis, unspecified: Secondary | ICD-10-CM | POA: Diagnosis not present

## 2016-01-10 DIAGNOSIS — Z6826 Body mass index (BMI) 26.0-26.9, adult: Secondary | ICD-10-CM | POA: Diagnosis not present

## 2016-01-17 DIAGNOSIS — M75122 Complete rotator cuff tear or rupture of left shoulder, not specified as traumatic: Secondary | ICD-10-CM | POA: Diagnosis not present

## 2016-01-17 DIAGNOSIS — M24112 Other articular cartilage disorders, left shoulder: Secondary | ICD-10-CM | POA: Diagnosis not present

## 2016-01-17 DIAGNOSIS — M7542 Impingement syndrome of left shoulder: Secondary | ICD-10-CM | POA: Diagnosis not present

## 2016-01-17 DIAGNOSIS — M66812 Spontaneous rupture of other tendons, left shoulder: Secondary | ICD-10-CM | POA: Diagnosis not present

## 2016-01-17 DIAGNOSIS — G8918 Other acute postprocedural pain: Secondary | ICD-10-CM | POA: Diagnosis not present

## 2016-01-17 DIAGNOSIS — S43432A Superior glenoid labrum lesion of left shoulder, initial encounter: Secondary | ICD-10-CM | POA: Diagnosis not present

## 2016-01-17 DIAGNOSIS — M19012 Primary osteoarthritis, left shoulder: Secondary | ICD-10-CM | POA: Diagnosis not present

## 2016-01-25 ENCOUNTER — Other Ambulatory Visit: Payer: Self-pay | Admitting: Nurse Practitioner

## 2016-01-27 DIAGNOSIS — Z4789 Encounter for other orthopedic aftercare: Secondary | ICD-10-CM | POA: Diagnosis not present

## 2016-02-06 DIAGNOSIS — M75112 Incomplete rotator cuff tear or rupture of left shoulder, not specified as traumatic: Secondary | ICD-10-CM | POA: Diagnosis not present

## 2016-02-20 DIAGNOSIS — M75112 Incomplete rotator cuff tear or rupture of left shoulder, not specified as traumatic: Secondary | ICD-10-CM | POA: Diagnosis not present

## 2016-02-24 DIAGNOSIS — Z4789 Encounter for other orthopedic aftercare: Secondary | ICD-10-CM | POA: Diagnosis not present

## 2016-02-24 DIAGNOSIS — M75112 Incomplete rotator cuff tear or rupture of left shoulder, not specified as traumatic: Secondary | ICD-10-CM | POA: Diagnosis not present

## 2016-02-27 DIAGNOSIS — M75112 Incomplete rotator cuff tear or rupture of left shoulder, not specified as traumatic: Secondary | ICD-10-CM | POA: Diagnosis not present

## 2016-03-01 DIAGNOSIS — M75112 Incomplete rotator cuff tear or rupture of left shoulder, not specified as traumatic: Secondary | ICD-10-CM | POA: Diagnosis not present

## 2016-03-05 DIAGNOSIS — M75112 Incomplete rotator cuff tear or rupture of left shoulder, not specified as traumatic: Secondary | ICD-10-CM | POA: Diagnosis not present

## 2016-03-08 DIAGNOSIS — M75112 Incomplete rotator cuff tear or rupture of left shoulder, not specified as traumatic: Secondary | ICD-10-CM | POA: Diagnosis not present

## 2016-03-12 DIAGNOSIS — M75112 Incomplete rotator cuff tear or rupture of left shoulder, not specified as traumatic: Secondary | ICD-10-CM | POA: Diagnosis not present

## 2016-03-15 DIAGNOSIS — M75112 Incomplete rotator cuff tear or rupture of left shoulder, not specified as traumatic: Secondary | ICD-10-CM | POA: Diagnosis not present

## 2016-03-20 DIAGNOSIS — M75112 Incomplete rotator cuff tear or rupture of left shoulder, not specified as traumatic: Secondary | ICD-10-CM | POA: Diagnosis not present

## 2016-03-23 DIAGNOSIS — M75112 Incomplete rotator cuff tear or rupture of left shoulder, not specified as traumatic: Secondary | ICD-10-CM | POA: Diagnosis not present

## 2016-03-23 DIAGNOSIS — Z4789 Encounter for other orthopedic aftercare: Secondary | ICD-10-CM | POA: Diagnosis not present

## 2016-03-27 DIAGNOSIS — M75112 Incomplete rotator cuff tear or rupture of left shoulder, not specified as traumatic: Secondary | ICD-10-CM | POA: Diagnosis not present

## 2016-03-29 DIAGNOSIS — M75112 Incomplete rotator cuff tear or rupture of left shoulder, not specified as traumatic: Secondary | ICD-10-CM | POA: Diagnosis not present

## 2016-04-02 DIAGNOSIS — M75112 Incomplete rotator cuff tear or rupture of left shoulder, not specified as traumatic: Secondary | ICD-10-CM | POA: Diagnosis not present

## 2016-04-05 DIAGNOSIS — M75112 Incomplete rotator cuff tear or rupture of left shoulder, not specified as traumatic: Secondary | ICD-10-CM | POA: Diagnosis not present

## 2016-04-09 DIAGNOSIS — M75112 Incomplete rotator cuff tear or rupture of left shoulder, not specified as traumatic: Secondary | ICD-10-CM | POA: Diagnosis not present

## 2016-04-12 DIAGNOSIS — M75112 Incomplete rotator cuff tear or rupture of left shoulder, not specified as traumatic: Secondary | ICD-10-CM | POA: Diagnosis not present

## 2016-04-17 DIAGNOSIS — M75112 Incomplete rotator cuff tear or rupture of left shoulder, not specified as traumatic: Secondary | ICD-10-CM | POA: Diagnosis not present

## 2016-04-19 DIAGNOSIS — Z4789 Encounter for other orthopedic aftercare: Secondary | ICD-10-CM | POA: Diagnosis not present

## 2016-04-20 DIAGNOSIS — M75112 Incomplete rotator cuff tear or rupture of left shoulder, not specified as traumatic: Secondary | ICD-10-CM | POA: Diagnosis not present

## 2016-04-23 DIAGNOSIS — M75112 Incomplete rotator cuff tear or rupture of left shoulder, not specified as traumatic: Secondary | ICD-10-CM | POA: Diagnosis not present

## 2016-04-25 DIAGNOSIS — R070 Pain in throat: Secondary | ICD-10-CM | POA: Diagnosis not present

## 2016-04-25 DIAGNOSIS — J310 Chronic rhinitis: Secondary | ICD-10-CM | POA: Diagnosis not present

## 2016-04-25 DIAGNOSIS — J329 Chronic sinusitis, unspecified: Secondary | ICD-10-CM | POA: Diagnosis not present

## 2016-04-25 DIAGNOSIS — J069 Acute upper respiratory infection, unspecified: Secondary | ICD-10-CM | POA: Diagnosis not present

## 2016-04-25 DIAGNOSIS — Z1389 Encounter for screening for other disorder: Secondary | ICD-10-CM | POA: Diagnosis not present

## 2016-04-25 DIAGNOSIS — Z6825 Body mass index (BMI) 25.0-25.9, adult: Secondary | ICD-10-CM | POA: Diagnosis not present

## 2016-04-25 DIAGNOSIS — J209 Acute bronchitis, unspecified: Secondary | ICD-10-CM | POA: Diagnosis not present

## 2016-04-26 DIAGNOSIS — M75112 Incomplete rotator cuff tear or rupture of left shoulder, not specified as traumatic: Secondary | ICD-10-CM | POA: Diagnosis not present

## 2016-04-30 DIAGNOSIS — M75112 Incomplete rotator cuff tear or rupture of left shoulder, not specified as traumatic: Secondary | ICD-10-CM | POA: Diagnosis not present

## 2016-05-03 DIAGNOSIS — M75112 Incomplete rotator cuff tear or rupture of left shoulder, not specified as traumatic: Secondary | ICD-10-CM | POA: Diagnosis not present

## 2016-05-07 DIAGNOSIS — M75112 Incomplete rotator cuff tear or rupture of left shoulder, not specified as traumatic: Secondary | ICD-10-CM | POA: Diagnosis not present

## 2016-05-10 DIAGNOSIS — M75112 Incomplete rotator cuff tear or rupture of left shoulder, not specified as traumatic: Secondary | ICD-10-CM | POA: Diagnosis not present

## 2016-05-14 DIAGNOSIS — M75112 Incomplete rotator cuff tear or rupture of left shoulder, not specified as traumatic: Secondary | ICD-10-CM | POA: Diagnosis not present

## 2016-05-18 DIAGNOSIS — Z4789 Encounter for other orthopedic aftercare: Secondary | ICD-10-CM | POA: Diagnosis not present

## 2016-05-28 ENCOUNTER — Encounter: Payer: Self-pay | Admitting: Internal Medicine

## 2016-07-02 DIAGNOSIS — Z4789 Encounter for other orthopedic aftercare: Secondary | ICD-10-CM | POA: Diagnosis not present

## 2016-07-02 DIAGNOSIS — M25512 Pain in left shoulder: Secondary | ICD-10-CM | POA: Diagnosis not present

## 2016-07-16 ENCOUNTER — Ambulatory Visit: Payer: Medicare Other | Admitting: Gastroenterology

## 2016-07-16 ENCOUNTER — Telehealth: Payer: Self-pay | Admitting: Gastroenterology

## 2016-07-16 ENCOUNTER — Encounter: Payer: Self-pay | Admitting: Gastroenterology

## 2016-07-16 NOTE — Telephone Encounter (Signed)
PATIENT WAS A NO SHOW AND LETTER SENT  °

## 2016-07-25 DIAGNOSIS — M79671 Pain in right foot: Secondary | ICD-10-CM | POA: Diagnosis not present

## 2016-07-25 DIAGNOSIS — Z1389 Encounter for screening for other disorder: Secondary | ICD-10-CM | POA: Diagnosis not present

## 2016-07-25 DIAGNOSIS — G629 Polyneuropathy, unspecified: Secondary | ICD-10-CM | POA: Diagnosis not present

## 2016-07-25 DIAGNOSIS — Z79899 Other long term (current) drug therapy: Secondary | ICD-10-CM | POA: Diagnosis not present

## 2016-07-25 DIAGNOSIS — E782 Mixed hyperlipidemia: Secondary | ICD-10-CM | POA: Diagnosis not present

## 2016-07-25 DIAGNOSIS — E663 Overweight: Secondary | ICD-10-CM | POA: Diagnosis not present

## 2016-07-25 DIAGNOSIS — M810 Age-related osteoporosis without current pathological fracture: Secondary | ICD-10-CM | POA: Diagnosis not present

## 2016-07-25 DIAGNOSIS — G4700 Insomnia, unspecified: Secondary | ICD-10-CM | POA: Diagnosis not present

## 2016-07-25 DIAGNOSIS — R7301 Impaired fasting glucose: Secondary | ICD-10-CM | POA: Diagnosis not present

## 2016-07-25 DIAGNOSIS — Z6825 Body mass index (BMI) 25.0-25.9, adult: Secondary | ICD-10-CM | POA: Diagnosis not present

## 2016-08-04 ENCOUNTER — Emergency Department (HOSPITAL_COMMUNITY)
Admission: EM | Admit: 2016-08-04 | Discharge: 2016-08-04 | Disposition: A | Discharge status: Home / Self Care | Payer: Medicare Other | Attending: Emergency Medicine | Admitting: Emergency Medicine

## 2016-08-04 ENCOUNTER — Emergency Department (HOSPITAL_COMMUNITY): Payer: Medicare Other

## 2016-08-04 ENCOUNTER — Encounter (HOSPITAL_COMMUNITY): Payer: Self-pay

## 2016-08-04 DIAGNOSIS — W01198A Fall on same level from slipping, tripping and stumbling with subsequent striking against other object, initial encounter: Secondary | ICD-10-CM | POA: Insufficient documentation

## 2016-08-04 DIAGNOSIS — Y939 Activity, unspecified: Secondary | ICD-10-CM | POA: Insufficient documentation

## 2016-08-04 DIAGNOSIS — S99921A Unspecified injury of right foot, initial encounter: Secondary | ICD-10-CM | POA: Diagnosis not present

## 2016-08-04 DIAGNOSIS — Z79899 Other long term (current) drug therapy: Secondary | ICD-10-CM | POA: Diagnosis not present

## 2016-08-04 DIAGNOSIS — Y999 Unspecified external cause status: Secondary | ICD-10-CM | POA: Insufficient documentation

## 2016-08-04 DIAGNOSIS — S9031XA Contusion of right foot, initial encounter: Secondary | ICD-10-CM | POA: Diagnosis not present

## 2016-08-04 DIAGNOSIS — Y929 Unspecified place or not applicable: Secondary | ICD-10-CM | POA: Insufficient documentation

## 2016-08-04 DIAGNOSIS — I10 Essential (primary) hypertension: Secondary | ICD-10-CM | POA: Insufficient documentation

## 2016-08-04 DIAGNOSIS — Z87891 Personal history of nicotine dependence: Secondary | ICD-10-CM | POA: Diagnosis not present

## 2016-08-04 MED ORDER — TRAMADOL HCL 50 MG PO TABS: 50.0000 mg | ORAL_TABLET | Freq: Four times a day (QID) | ORAL | 0 refills | Status: DC | PRN

## 2016-08-04 NOTE — Discharge Instructions (Signed)
Elevate your foot, apply ice packs on/off to the heel.  Call the podiatrist listed to arrange a follow-up appt

## 2016-08-04 NOTE — ED Triage Notes (Signed)
Patient states he tripped over a gas hose, fell and "stumped his heel on the concrete" denies any other complaint

## 2016-08-07 DIAGNOSIS — M109 Gout, unspecified: Secondary | ICD-10-CM | POA: Diagnosis not present

## 2016-08-07 DIAGNOSIS — M79671 Pain in right foot: Secondary | ICD-10-CM | POA: Diagnosis not present

## 2016-08-07 DIAGNOSIS — G629 Polyneuropathy, unspecified: Secondary | ICD-10-CM | POA: Diagnosis not present

## 2016-08-07 NOTE — ED Provider Notes (Signed)
[facility] DEPT Provider Note   CSN: [id number] Arrival date & time: 08/04/16  1913     History   Chief Complaint Chief Complaint  Patient presents with  . Foot Pain    HPI [name] is a 75 y.o. male.  HPI  [name] is a 75 y.o. male with hx of peripheral neuropathy, who presents to the Emergency Department complaining of right heel pain for several hours.  He states that he stumbled over a gas hose and struck the heel of his right foot on the concrete.  Denies fall, but describes a sharp, throbbing pain to the heel of his foot when he bears weight.  He has not tried any therapies.  He also denies swelling, ankle pain, abrasions or open wounds to his foot.     Past Medical History:  Diagnosis Date  . Barrett's esophagus    short-segment, diagnosed in 2007 by Dr. [name]  . BPH (benign prostatic hyperplasia)   . GERD (gastroesophageal reflux disease)   . Hiatal hernia    small  . HTN (hypertension)   . S/P colonoscopy 2007   unremarkable  . S/P endoscopy Oct 2011    Salmon-colored epithelium coming up to 37 cm from the   . Vertigo     Patient Active Problem List   Diagnosis Date Noted  . DIZZINESS 05/09/2010  . CHEST PAIN 05/09/2010  . GERD 01/31/2009  . BARRETTS ESOPHAGUS 01/31/2009    Past Surgical History:  Procedure Laterality Date  . APPENDECTOMY    . CHOLECYSTECTOMY    . COLONOSCOPY  02/06/2006   Dr. [name]- single anal papilla, o/w normal rectum, normal colon  . ESOPHAGOGASTRODUODENOSCOPY  09/15/2010   Dr. [name]- Salmon-colored epithelium coming up to 37 cm from the   . ESOPHAGOGASTRODUODENOSCOPY N/A 07/02/2013   Dr. [name]- Barretts on bx. hiatal hernia  . ROTATOR CUFF REPAIR    . YAG LASER APPLICATION Right [id number]   Procedure: YAG LASER APPLICATION;  Surgeon: [name], MD;  Location: [facility];  Service: Ophthalmology;  Laterality: Right;       Home Medications    Prior to Admission medications   Medication Sig Start Date  End Date Taking? Authorizing Provider  acetaminophen (TYLENOL) 500 MG tablet Take 500 mg by mouth every 6 (six) hours as needed for mild pain.   Yes Historical Provider, MD  fexofenadine (ALLEGRA) 180 MG tablet Take 180 mg by mouth every morning.   Yes Historical Provider, MD  gabapentin (NEURONTIN) 300 MG capsule Take 600 mg by mouth 3 (three) times daily.  07/28/16  Yes Historical Provider, MD  pantoprazole (PROTONIX) 40 MG tablet TAKE ONE TABLET TWICE DAILY 01/25/16  Yes Mahala Menghini, PA-C  Tamsulosin HCl (FLOMAX) 0.4 MG CAPS Take 0.4 mg by mouth 2 (two) times daily.  05/10/11  Yes Historical Provider, MD  traMADol (ULTRAM) 50 MG tablet Take 1 tablet (50 mg total) by mouth every 6 (six) hours as needed. 08/04/16   Leyan Branden, PA-C    Family History Family History  Problem Relation Age of Onset  . Prostate cancer Father     deceased  . Stroke Mother     deceased  . Colon cancer Neg Hx     Social History Social History  Substance Use Topics  . Smoking status: Former Smoker    Packs/day: 1.50    Years: 30.00    Types: Cigarettes    Quit date: 11/18/1993  . Smokeless tobacco: Never Used  Comment: quit in the 90's  . Alcohol use No     Allergies   Review of patient's allergies indicates no known allergies.   Review of Systems Review of Systems  Constitutional: Negative for chills and fever.  Genitourinary: Negative for difficulty urinating and dysuria.  Musculoskeletal: Positive for arthralgias. Negative for joint swelling.  Skin: Negative for color change and wound.  Neurological: Negative for dizziness and weakness.  All other systems reviewed and are negative.    Physical Exam Updated Vital Signs BP 138/71 (BP Location: Left Arm)   Pulse (!) 59   Temp 97.6 F (36.4 C) (Oral)   Resp 20   Ht 5\' 7"  (1.702 m)   Wt 96.2 kg   SpO2 95%   BMI 33.20 kg/m   Physical Exam  Constitutional: He is oriented to person, place, and time. He appears well-developed and  well-nourished. No distress.  HENT:  Head: Normocephalic and atraumatic.  Cardiovascular: Normal rate, regular rhythm and intact distal pulses.   Pulmonary/Chest: Effort normal and breath sounds normal.  Musculoskeletal: He exhibits tenderness. He exhibits no edema or deformity.  Focal tenderness of the plantar surface of the right heel.   DP pulse is brisk,distal sensation intact.  No edema,  erythema, abrasion, bruising or bony deformity.  No proximal tenderness.  Neurological: He is alert and oriented to person, place, and time. He exhibits normal muscle tone. Coordination normal.  Skin: Skin is warm and dry.  Nursing note and vitals reviewed.    ED Treatments / Results  Labs (all labs ordered are listed, but only abnormal results are displayed) Labs Reviewed - No data to display  EKG  EKG Interpretation None       Radiology Dg Foot Complete Right  Result Date: 08/04/2016 CLINICAL DATA:  Blunt force injury to the right heel. EXAM: RIGHT FOOT COMPLETE - 3+ VIEW COMPARISON:  None. FINDINGS: No fracture, dislocation or suspicious focal osseous lesion. Tiny Achilles right calcaneal spur. No appreciable degenerative or erosive arthropathy. No radiopaque foreign body. IMPRESSION: No fracture or malalignment. Electronically Signed   By: Ilona Sorrel M.D.   On: 08/04/2016 20:04     Procedures Procedures (including critical care time)  Medications Ordered in ED Medications - No data to display   Initial Impression / Assessment and Plan / ED Course  I have reviewed the triage vital signs and the nursing notes.  Pertinent labs & imaging results that were available during my care of the patient were reviewed by me and considered in my medical decision making (see chart for details).  Clinical Course   XR neg for fx.  Discussed possible occult calcaneal fx although unlikely. NV intact.  Pt advised of need for podiatry f/u   Offered post op shoe, but he declined.    Final  Clinical Impressions(s) / ED Diagnoses   Final diagnoses:  Contusion of foot or heel, right, initial encounter    New Prescriptions Discharge Medication List as of 08/04/2016  9:34 PM    START taking these medications   Details  traMADol (ULTRAM) 50 MG tablet Take 1 tablet (50 mg total) by mouth every 6 (six) hours as needed., Starting Sat 08/04/2016, Print         Hanadi Stanly [place], PA-C 08/07/16 0125    Noemi Chapel, MD 08/08/16 [phone]

## 2016-08-08 ENCOUNTER — Other Ambulatory Visit: Payer: Self-pay | Admitting: Gastroenterology

## 2016-08-21 ENCOUNTER — Ambulatory Visit: Payer: Medicare Other | Admitting: Internal Medicine

## 2016-08-21 DIAGNOSIS — G629 Polyneuropathy, unspecified: Secondary | ICD-10-CM | POA: Diagnosis not present

## 2016-08-21 DIAGNOSIS — M722 Plantar fascial fibromatosis: Secondary | ICD-10-CM | POA: Diagnosis not present

## 2016-08-22 DIAGNOSIS — Z23 Encounter for immunization: Secondary | ICD-10-CM | POA: Diagnosis not present

## 2016-08-28 ENCOUNTER — Encounter: Payer: Self-pay | Admitting: Internal Medicine

## 2016-08-28 ENCOUNTER — Ambulatory Visit (INDEPENDENT_AMBULATORY_CARE_PROVIDER_SITE_OTHER): Payer: Medicare Other | Admitting: Internal Medicine

## 2016-08-28 VITALS — BP 122/72 | HR 79 | Temp 97.7°F | Ht 71.0 in | Wt 214.6 lb

## 2016-08-28 DIAGNOSIS — K219 Gastro-esophageal reflux disease without esophagitis: Secondary | ICD-10-CM

## 2016-08-28 DIAGNOSIS — K2270 Barrett's esophagus without dysplasia: Secondary | ICD-10-CM | POA: Diagnosis not present

## 2016-08-28 NOTE — Patient Instructions (Signed)
Schedule an EGD (Barrett's) and Colonoscopy (screening) in the near future  Continue Protonix 40 mg twice daily  Further recommendations to follow

## 2016-08-28 NOTE — Progress Notes (Signed)
Primary Care Physician:  Collene Mares, PA-C Primary Gastroenterologist:  Dr. [name]  Pre-Procedure History & Physical: HPI:  [name] is a 75 y.o. male here for follow-up of GERD/Barrett's esophagus. I saw him last year for left flank/ groin pain. He was quite symptomatic. It seen by his PCP, urologist and me. EGD evaluation as well as CT ,testicular ultrasound all unrevealing. He tells me his pain was related to a pinched nerve.  Reports seeing Dr. Dr. Carloyn Manner in [place]; he was found to have a radiculopathy; received injections in his back and this took care of his symptoms. Reflux well controlled on Protonix 40 mg twice daily. Requires twice a day therapy. No dysphagia. No lower GI tract symptoms such as melena or hematochezia. Last screening colonoscopy 10 years ago; due for one more screening examination now. Also, due for surveillance EGD  -   last one done 3 years ago without dysplasia seen on biopsies.  Past Medical History:  Diagnosis Date  . Barrett's esophagus    short-segment, diagnosed in 2007 by Dr. [name]  . BPH (benign prostatic hyperplasia)   . GERD (gastroesophageal reflux disease)   . Hiatal hernia    small  . HTN (hypertension)   . S/P colonoscopy 2007   unremarkable  . S/P endoscopy Oct 2011    Salmon-colored epithelium coming up to 37 cm from the   . Vertigo     Past Surgical History:  Procedure Laterality Date  . APPENDECTOMY    . CHOLECYSTECTOMY    . COLONOSCOPY  02/06/2006   Dr. [name]- single anal papilla, o/w normal rectum, normal colon  . ESOPHAGOGASTRODUODENOSCOPY  09/15/2010   Dr. [name]- Salmon-colored epithelium coming up to 37 cm from the   . ESOPHAGOGASTRODUODENOSCOPY N/A 07/02/2013   Dr. [name]- Barretts on bx. hiatal hernia  . ROTATOR CUFF REPAIR    . YAG LASER APPLICATION Right [id number]   Procedure: YAG LASER APPLICATION;  Surgeon: [name], MD;  Location: [facility];  Service: Ophthalmology;  Laterality: Right;    Prior to Admission  medications   Medication Sig Start Date End Date Taking? Authorizing Provider  acetaminophen (TYLENOL) 500 MG tablet Take 500 mg by mouth every 6 (six) hours as needed for mild pain.   Yes Historical Provider, MD  fexofenadine (ALLEGRA) 180 MG tablet Take 180 mg by mouth every morning.   Yes Historical Provider, MD  gabapentin (NEURONTIN) 300 MG capsule Take 600 mg by mouth 3 (three) times daily.  07/28/16  Yes Historical Provider, MD  pantoprazole (PROTONIX) 40 MG tablet TAKE ONE TABLET TWICE DAILY 08/09/16  Yes Carlis Stable, NP  Tamsulosin HCl (FLOMAX) 0.4 MG CAPS Take 0.4 mg by mouth 2 (two) times daily.  05/10/11  Yes Historical Provider, MD  traMADol (ULTRAM) 50 MG tablet Take 1 tablet (50 mg total) by mouth every 6 (six) hours as needed. Patient not taking: Reported on 08/28/2016 08/04/16   Tammy Triplett, PA-C    Allergies as of 08/28/2016  . (No Known Allergies)    Family History  Problem Relation Age of Onset  . Prostate cancer Father     deceased  . Stroke Mother     deceased  . Colon cancer Neg Hx     Social History   Social History  . Marital status: Married    Spouse name: N/A  . Number of children: N/A  . Years of education: N/A   Occupational History  . Not on file.  Social History Main Topics  . Smoking status: Former Smoker    Packs/day: 1.50    Years: 30.00    Types: Cigarettes    Quit date: 11/18/1993  . Smokeless tobacco: Never Used     Comment: quit in the 90's  . Alcohol use No  . Drug use: No  . Sexual activity: Not on file   Other Topics Concern  . Not on file   Social History Narrative  . No narrative on file    Review of Systems: See HPI, otherwise negative ROS  Physical Exam: BP 122/72   Pulse 79   Temp 97.7 F (36.5 C) (Oral)   Ht 5\' 11"  (1.803 m)   Wt 214 lb 9.6 oz (97.3 kg)   BMI 29.93 kg/m  General:   Alert,  pleasant and cooperative in NAD SNeck:  Supple; no masses or thyromegaly. No significant cervical adenopathy. Lungs:   Clear throughout to auscultation.   No wheezes, crackles, or rhonchi. No acute distress. Heart:  Regular rate and rhythm; no murmurs, clicks, rubs,  or gallops. Abdomen: Non-distended, normal bowel sounds.  Soft and nontender without appreciable mass or hepatosplenomegaly.  Pulses:  Normal pulses noted. Extremities:  Without clubbing or edema. 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  He requires twice a day PPI therapy to control symptoms. No alarm symptoms. History of short segment Barrett's esophagus-due for surveillance at this time. Also,  due for one more average risk screening colonoscopy.  Recommendations: I have offered the Patient Both an EGD and a Colonoscopy in the same setting.The risks, benefits, limitations, imponderables and alternatives regarding both EGD and colonoscopy have been reviewed with the patient. Questions have been answered. All parties agreeable.  Schedule an EGD (Barrett's) and Colonoscopy (screening) in the near future  Continue Protonix 40 mg twice daily  Further recommendations to follow     Notice: This dictation was prepared with Dragon dictation along with smaller phrase technology. Any transcriptional errors that result from this process are unintentional and may not be corrected upon review.

## 2016-08-29 ENCOUNTER — Other Ambulatory Visit: Payer: Self-pay

## 2016-08-29 DIAGNOSIS — Z1211 Encounter for screening for malignant neoplasm of colon: Secondary | ICD-10-CM

## 2016-08-29 DIAGNOSIS — K22719 Barrett's esophagus with dysplasia, unspecified: Secondary | ICD-10-CM

## 2016-08-29 MED ORDER — PEG-KCL-NACL-NASULF-NA ASC-C 100 G PO SOLR: 1.0000 | ORAL | 0 refills | Status: DC

## 2016-09-13 ENCOUNTER — Encounter (HOSPITAL_COMMUNITY): Payer: Self-pay

## 2016-09-13 ENCOUNTER — Encounter (HOSPITAL_COMMUNITY)
Admission: RE | Disposition: A | Discharge status: Home / Self Care | Payer: Self-pay | Source: Ambulatory Visit | Attending: Internal Medicine

## 2016-09-13 ENCOUNTER — Ambulatory Visit (HOSPITAL_COMMUNITY)
Admission: RE | Admit: 2016-09-13 | Discharge: 2016-09-13 | Disposition: A | Discharge status: Home / Self Care | Payer: Medicare Other | Source: Ambulatory Visit | Attending: Internal Medicine | Admitting: Internal Medicine

## 2016-09-13 DIAGNOSIS — Z8042 Family history of malignant neoplasm of prostate: Secondary | ICD-10-CM | POA: Insufficient documentation

## 2016-09-13 DIAGNOSIS — K5730 Diverticulosis of large intestine without perforation or abscess without bleeding: Secondary | ICD-10-CM | POA: Diagnosis not present

## 2016-09-13 DIAGNOSIS — K219 Gastro-esophageal reflux disease without esophagitis: Secondary | ICD-10-CM | POA: Insufficient documentation

## 2016-09-13 DIAGNOSIS — N400 Enlarged prostate without lower urinary tract symptoms: Secondary | ICD-10-CM | POA: Insufficient documentation

## 2016-09-13 DIAGNOSIS — Z823 Family history of stroke: Secondary | ICD-10-CM | POA: Diagnosis not present

## 2016-09-13 DIAGNOSIS — Z87891 Personal history of nicotine dependence: Secondary | ICD-10-CM | POA: Insufficient documentation

## 2016-09-13 DIAGNOSIS — Z1211 Encounter for screening for malignant neoplasm of colon: Secondary | ICD-10-CM

## 2016-09-13 DIAGNOSIS — Z1212 Encounter for screening for malignant neoplasm of rectum: Secondary | ICD-10-CM | POA: Diagnosis not present

## 2016-09-13 DIAGNOSIS — K2270 Barrett's esophagus without dysplasia: Secondary | ICD-10-CM | POA: Diagnosis not present

## 2016-09-13 DIAGNOSIS — D123 Benign neoplasm of transverse colon: Secondary | ICD-10-CM | POA: Diagnosis not present

## 2016-09-13 DIAGNOSIS — K449 Diaphragmatic hernia without obstruction or gangrene: Secondary | ICD-10-CM | POA: Diagnosis not present

## 2016-09-13 DIAGNOSIS — D120 Benign neoplasm of cecum: Secondary | ICD-10-CM

## 2016-09-13 DIAGNOSIS — I10 Essential (primary) hypertension: Secondary | ICD-10-CM | POA: Diagnosis not present

## 2016-09-13 DIAGNOSIS — K22719 Barrett's esophagus with dysplasia, unspecified: Secondary | ICD-10-CM

## 2016-09-13 HISTORY — PX: ESOPHAGOGASTRODUODENOSCOPY: SHX5428

## 2016-09-13 HISTORY — PX: COLONOSCOPY: SHX5424

## 2016-09-13 SURGERY — COLONOSCOPY
Anesthesia: Moderate Sedation

## 2016-09-13 MED ORDER — SODIUM CHLORIDE 0.9 % IV SOLN
INTRAVENOUS | Status: DC
  Administered 2016-09-13: 13:00:00 via INTRAVENOUS

## 2016-09-13 MED ORDER — MIDAZOLAM HCL 5 MG/5ML IJ SOLN
INTRAMUSCULAR | Status: DC | PRN
  Administered 2016-09-13 (×2): 1 mg via INTRAVENOUS
  Administered 2016-09-13: 2 mg via INTRAVENOUS
  Administered 2016-09-13 (×4): 1 mg via INTRAVENOUS
  Administered 2016-09-13: 2 mg via INTRAVENOUS
  Administered 2016-09-13: 1 mg via INTRAVENOUS

## 2016-09-13 MED ORDER — ONDANSETRON HCL 4 MG/2ML IJ SOLN
INTRAMUSCULAR | Status: AC
  Filled 2016-09-13: qty 2

## 2016-09-13 MED ORDER — MIDAZOLAM HCL 5 MG/5ML IJ SOLN
INTRAMUSCULAR | Status: AC
  Filled 2016-09-13: qty 10

## 2016-09-13 MED ORDER — ONDANSETRON HCL 4 MG/2ML IJ SOLN
INTRAMUSCULAR | Status: DC | PRN
  Administered 2016-09-13: 4 mg via INTRAVENOUS

## 2016-09-13 MED ORDER — STERILE WATER FOR IRRIGATION IR SOLN
Status: DC | PRN
  Administered 2016-09-13: 14:00:00

## 2016-09-13 MED ORDER — LIDOCAINE VISCOUS 2 % MT SOLN
OROMUCOSAL | Status: DC | PRN
  Administered 2016-09-13: 3 mL via OROMUCOSAL

## 2016-09-13 MED ORDER — MIDAZOLAM HCL 5 MG/5ML IJ SOLN
INTRAMUSCULAR | Status: AC
  Filled 2016-09-13: qty 5

## 2016-09-13 MED ORDER — LIDOCAINE VISCOUS 2 % MT SOLN
OROMUCOSAL | Status: AC
  Filled 2016-09-13: qty 15

## 2016-09-13 MED ORDER — MEPERIDINE HCL 100 MG/ML IJ SOLN
INTRAMUSCULAR | Status: AC
  Filled 2016-09-13: qty 2

## 2016-09-13 MED ORDER — MEPERIDINE HCL 100 MG/ML IJ SOLN
INTRAMUSCULAR | Status: DC | PRN
  Administered 2016-09-13: 50 mg via INTRAVENOUS
  Administered 2016-09-13 (×4): 25 mg via INTRAVENOUS

## 2016-09-13 NOTE — Interval H&P Note (Signed)
History and Physical Interval Note:  09/13/2016 1:44 PM  [name]  has presented today for surgery, with the diagnosis of SCREENING/BARRETT'S  The various methods of treatment have been discussed with the patient and family. After consideration of risks, benefits and other options for treatment, the patient has consented to  Procedure(s) with comments: COLONOSCOPY (N/A) - 145  ESOPHAGOGASTRODUODENOSCOPY (EGD) (N/A) as a surgical intervention .  The patient's history has been reviewed, patient examined, no change in status, stable for surgery.  I have reviewed the patient's chart and labs.  Questions were answered to the patient's satisfaction.     [name]  No change. No dysphagia. Surveillance EGD and screening colonoscopy plan.  The risks, benefits, limitations, imponderables and alternatives regarding both EGD and colonoscopy have been reviewed with the patient. Questions have been answered. All parties agreeable.

## 2016-09-13 NOTE — H&P (View-Only) (Signed)
Primary Care Physician:  Collene Mares, PA-C Primary Gastroenterologist:  Dr. [name]  Pre-Procedure History & Physical: HPI:  [name] is a 75 y.o. male here for follow-up of GERD/Barrett's esophagus. I saw him last year for left flank/ groin pain. He was quite symptomatic. It seen by his PCP, urologist and me. EGD evaluation as well as CT ,testicular ultrasound all unrevealing. He tells me his pain was related to a pinched nerve.  Reports seeing Dr. Dr. Carloyn Manner in [place]; he was found to have a radiculopathy; received injections in his back and this took care of his symptoms. Reflux well controlled on Protonix 40 mg twice daily. Requires twice a day therapy. No dysphagia. No lower GI tract symptoms such as melena or hematochezia. Last screening colonoscopy 10 years ago; due for one more screening examination now. Also, due for surveillance EGD  -   last one done 3 years ago without dysplasia seen on biopsies.  Past Medical History:  Diagnosis Date  . Barrett's esophagus    short-segment, diagnosed in 2007 by Dr. [name]  . BPH (benign prostatic hyperplasia)   . GERD (gastroesophageal reflux disease)   . Hiatal hernia    small  . HTN (hypertension)   . S/P colonoscopy 2007   unremarkable  . S/P endoscopy Oct 2011    Salmon-colored epithelium coming up to 37 cm from the   . Vertigo     Past Surgical History:  Procedure Laterality Date  . APPENDECTOMY    . CHOLECYSTECTOMY    . COLONOSCOPY  02/06/2006   Dr. [name]- single anal papilla, o/w normal rectum, normal colon  . ESOPHAGOGASTRODUODENOSCOPY  09/15/2010   Dr. [name]- Salmon-colored epithelium coming up to 37 cm from the   . ESOPHAGOGASTRODUODENOSCOPY N/A 07/02/2013   Dr. [name]- Barretts on bx. hiatal hernia  . ROTATOR CUFF REPAIR    . YAG LASER APPLICATION Right [id number]   Procedure: YAG LASER APPLICATION;  Surgeon: [name], MD;  Location: [facility];  Service: Ophthalmology;  Laterality: Right;    Prior to Admission  medications   Medication Sig Start Date End Date Taking? Authorizing Provider  acetaminophen (TYLENOL) 500 MG tablet Take 500 mg by mouth every 6 (six) hours as needed for mild pain.   Yes Historical Provider, MD  fexofenadine (ALLEGRA) 180 MG tablet Take 180 mg by mouth every morning.   Yes Historical Provider, MD  gabapentin (NEURONTIN) 300 MG capsule Take 600 mg by mouth 3 (three) times daily.  07/28/16  Yes Historical Provider, MD  pantoprazole (PROTONIX) 40 MG tablet TAKE ONE TABLET TWICE DAILY 08/09/16  Yes Carlis Stable, NP  Tamsulosin HCl (FLOMAX) 0.4 MG CAPS Take 0.4 mg by mouth 2 (two) times daily.  05/10/11  Yes Historical Provider, MD  traMADol (ULTRAM) 50 MG tablet Take 1 tablet (50 mg total) by mouth every 6 (six) hours as needed. Patient not taking: Reported on 08/28/2016 08/04/16   Tammy Triplett, PA-C    Allergies as of 08/28/2016  . (No Known Allergies)    Family History  Problem Relation Age of Onset  . Prostate cancer Father     deceased  . Stroke Mother     deceased  . Colon cancer Neg Hx     Social History   Social History  . Marital status: Married    Spouse name: N/A  . Number of children: N/A  . Years of education: N/A   Occupational History  . Not on file.  Social History Main Topics  . Smoking status: Former Smoker    Packs/day: 1.50    Years: 30.00    Types: Cigarettes    Quit date: 11/18/1993  . Smokeless tobacco: Never Used     Comment: quit in the 90's  . Alcohol use No  . Drug use: No  . Sexual activity: Not on file   Other Topics Concern  . Not on file   Social History Narrative  . No narrative on file    Review of Systems: See HPI, otherwise negative ROS  Physical Exam: BP 122/72   Pulse 79   Temp 97.7 F (36.5 C) (Oral)   Ht 5\' 11"  (1.803 m)   Wt 214 lb 9.6 oz (97.3 kg)   BMI 29.93 kg/m  General:   Alert,  pleasant and cooperative in NAD SNeck:  Supple; no masses or thyromegaly. No significant cervical adenopathy. Lungs:   Clear throughout to auscultation.   No wheezes, crackles, or rhonchi. No acute distress. Heart:  Regular rate and rhythm; no murmurs, clicks, rubs,  or gallops. Abdomen: Non-distended, normal bowel sounds.  Soft and nontender without appreciable mass or hepatosplenomegaly.  Pulses:  Normal pulses noted. Extremities:  Without clubbing or edema. 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  He requires twice a day PPI therapy to control symptoms. No alarm symptoms. History of short segment Barrett's esophagus-due for surveillance at this time. Also,  due for one more average risk screening colonoscopy.  Recommendations: I have offered the Patient Both an EGD and a Colonoscopy in the same setting.The risks, benefits, limitations, imponderables and alternatives regarding both EGD and colonoscopy have been reviewed with the patient. Questions have been answered. All parties agreeable.  Schedule an EGD (Barrett's) and Colonoscopy (screening) in the near future  Continue Protonix 40 mg twice daily  Further recommendations to follow     Notice: This dictation was prepared with Dragon dictation along with smaller phrase technology. Any transcriptional errors that result from this process are unintentional and may not be corrected upon review.

## 2016-09-13 NOTE — Discharge Instructions (Signed)
Colonoscopy Discharge Instructions  Read the instructions outlined below and refer to this sheet in the next few weeks. These discharge instructions provide you with general information on caring for yourself after you leave the hospital. Your doctor may also give you specific instructions. While your treatment has been planned according to the most current medical practices available, unavoidable complications occasionally occur. If you have any problems or questions after discharge, call Dr. [name] at [phone]. ACTIVITY  You may resume your regular activity, but move at a slower pace for the next 24 hours.   Take frequent rest periods for the next 24 hours.   Walking will help get rid of the air and reduce the bloated feeling in your belly (abdomen).   No driving for 24 hours (because of the medicine (anesthesia) used during the test).    Do not sign any important legal documents or operate any machinery for 24 hours (because of the anesthesia used during the test).  NUTRITION  Drink plenty of fluids.   You may resume your normal diet as instructed by your doctor.   Begin with a light meal and progress to your normal diet. Heavy or fried foods are harder to digest and may make you feel sick to your stomach (nauseated).   Avoid alcoholic beverages for 24 hours or as instructed.  MEDICATIONS  You may resume your normal medications unless your doctor tells you otherwise.  WHAT YOU CAN EXPECT TODAY  Some feelings of bloating in the abdomen.   Passage of more gas than usual.   Spotting of blood in your stool or on the toilet paper.  IF YOU HAD POLYPS REMOVED DURING THE COLONOSCOPY:  No aspirin products for 7 days or as instructed.   No alcohol for 7 days or as instructed.   Eat a soft diet for the next 24 hours.  FINDING OUT THE RESULTS OF YOUR TEST Not all test results are available during your visit. If your test results are not back during the visit, make an appointment  with your caregiver to find out the results. Do not assume everything is normal if you have not heard from your caregiver or the medical facility. It is important for you to follow up on all of your test results.  SEEK IMMEDIATE MEDICAL ATTENTION IF:  You have more than a spotting of blood in your stool.   Your belly is swollen (abdominal distention).   You are nauseated or vomiting.   You have a temperature over 101.   You have abdominal pain or discomfort that is severe or gets worse throughout the day.   EGD Discharge instructions Please read the instructions outlined below and refer to this sheet in the next few weeks. These discharge instructions provide you with general information on caring for yourself after you leave the hospital. Your doctor may also give you specific instructions. While your treatment has been planned according to the most current medical practices available, unavoidable complications occasionally occur. If you have any problems or questions after discharge, please call your doctor. ACTIVITY  You may resume your regular activity but move at a slower pace for the next 24 hours.   Take frequent rest periods for the next 24 hours.   Walking will help expel (get rid of) the air and reduce the bloated feeling in your abdomen.   No driving for 24 hours (because of the anesthesia (medicine) used during the test).   You may shower.   Do not sign any  important legal documents or operate any machinery for 24 hours (because of the anesthesia used during the test).  NUTRITION  Drink plenty of fluids.   You may resume your normal diet.   Begin with a light meal and progress to your normal diet.   Avoid alcoholic beverages for 24 hours or as instructed by your caregiver.  MEDICATIONS  You may resume your normal medications unless your caregiver tells you otherwise.  WHAT YOU CAN EXPECT TODAY  You may experience abdominal discomfort such as a feeling of  fullness or gas pains.  FOLLOW-UP  Your doctor will discuss the results of your test with you.  SEEK IMMEDIATE MEDICAL ATTENTION IF ANY OF THE FOLLOWING OCCUR:  Excessive nausea (feeling sick to your stomach) and/or vomiting.   Severe abdominal pain and distention (swelling).   Trouble swallowing.   Temperature over 101 F (37.8 C).   Rectal bleeding or vomiting of blood.    Continue Protonix 40 mg twice daily  Colon polyp and diverticulosis information provided  Further recommendations to follow pending review of pathology report   Diverticulosis Diverticulosis is the condition that develops when small pouches (diverticula) form in the wall of your colon. Your colon, or large intestine, is where water is absorbed and stool is formed. The pouches form when the inside layer of your colon pushes through weak spots in the outer layers of your colon. CAUSES  No one knows exactly what causes diverticulosis. RISK FACTORS  Being older than 63. Your risk for this condition increases with age. Diverticulosis is rare in people younger than 40 years. By age 91, almost everyone has it.  Eating a low-fiber diet.  Being frequently constipated.  Being overweight.  Not getting enough exercise.  Smoking.  Taking over-the-counter pain medicines, like aspirin and ibuprofen. SYMPTOMS  Most people with diverticulosis do not have symptoms. DIAGNOSIS  Because diverticulosis often has no symptoms, health care providers often discover the condition during an exam for other colon problems. In many cases, a health care provider will diagnose diverticulosis while using a flexible scope to examine the colon (colonoscopy). TREATMENT  If you have never developed an infection related to diverticulosis, you may not need treatment. If you have had an infection before, treatment may include:  Eating more fruits, vegetables, and grains.  Taking a fiber supplement.  Taking a live bacteria  supplement (probiotic).  Taking medicine to relax your colon. HOME CARE INSTRUCTIONS   Drink at least 6-8 glasses of water each day to prevent constipation.  Try not to strain when you have a bowel movement.  Keep all follow-up appointments. If you have had an infection before:  Increase the fiber in your diet as directed by your health care provider or dietitian.  Take a dietary fiber supplement if your health care provider approves.  Only take medicines as directed by your health care provider. SEEK MEDICAL CARE IF:   You have abdominal pain.  You have bloating.  You have cramps.  You have not gone to the bathroom in 3 days. SEEK IMMEDIATE MEDICAL CARE IF:   Your pain gets worse.  Yourbloating becomes very bad.  You have a fever or chills, and your symptoms suddenly get worse.  You begin vomiting.  You have bowel movements that are bloody or black. MAKE SURE YOU:  Understand these instructions.  Will watch your condition.  Will get help right away if you are not doing well or get worse.   This information is not intended  to replace advice given to you by your health care provider. Make sure you discuss any questions you have with your health care provider.   Document Released: 08/02/2004 Document Revised: 11/10/2013 Document Reviewed: 09/30/2013 Elsevier Interactive Patient Education 2016 [employer]   Colon Polyps Polyps are lumps of extra tissue growing inside the body. Polyps can grow in the large intestine (colon). Most colon polyps are noncancerous (benign). However, some colon polyps can become cancerous over time. Polyps that are larger than a pea may be harmful. To be safe, caregivers remove and test all polyps. CAUSES  Polyps form when mutations in the genes cause your cells to grow and divide even though no more tissue is needed. RISK FACTORS There are a number of risk factors that can increase your chances of getting colon polyps. They  include:  Being older than 50 years.  Family history of colon polyps or colon cancer.  Long-term colon diseases, such as colitis or Crohn disease.  Being overweight.  Smoking.  Being inactive.  Drinking too much alcohol. SYMPTOMS  Most small polyps do not cause symptoms. If symptoms are present, they may include:  Blood in the stool. The stool may look dark red or black.  Constipation or diarrhea that lasts longer than 1 week. DIAGNOSIS People often do not know they have polyps until their caregiver finds them during a regular checkup. Your caregiver can use 4 tests to check for polyps:  Digital rectal exam. The caregiver wears gloves and feels inside the rectum. This test would find polyps only in the rectum.  Barium enema. The caregiver puts a liquid called barium into your rectum before taking X-rays of your colon. Barium makes your colon look white. Polyps are dark, so they are easy to see in the X-ray pictures.  Sigmoidoscopy. A thin, flexible tube (sigmoidoscope) is placed into your rectum. The sigmoidoscope has a light and tiny camera in it. The caregiver uses the sigmoidoscope to look at the last third of your colon.  Colonoscopy. This test is like sigmoidoscopy, but the caregiver looks at the entire colon. This is the most common method for finding and removing polyps. TREATMENT  Any polyps will be removed during a sigmoidoscopy or colonoscopy. The polyps are then tested for cancer. PREVENTION  To help lower your risk of getting more colon polyps:  Eat plenty of fruits and vegetables. Avoid eating fatty foods.  Do not smoke.  Avoid drinking alcohol.  Exercise every day.  Lose weight if recommended by your caregiver.  Eat plenty of calcium and folate. Foods that are rich in calcium include milk, cheese, and broccoli. Foods that are rich in folate include chickpeas, kidney beans, and spinach. HOME CARE INSTRUCTIONS Keep all follow-up appointments as directed by  your caregiver. You may need periodic exams to check for polyps. SEEK MEDICAL CARE IF: You notice bleeding during a bowel movement.   This information is not intended to replace advice given to you by your health care provider. Make sure you discuss any questions you have with your health care provider.   Document Released: 08/01/2004 Document Revised: 11/26/2014 Document Reviewed: 01/15/2012 Elsevier Interactive Patient Education [employer].

## 2016-09-13 NOTE — Op Note (Signed)
[facility] Patient Name: [name] Procedure Date: 09/13/2016 9:50 AM MRN: [id number] Date of Birth: 1941/09/01 Attending MD: Norvel Richards , MD CSN: [id number] Age: 75 Admit Type: Outpatient Procedure:                Upper GI endoscopy with Biospy Indications:              Surveillance procedure Providers:                Norvel Richards, MD, Otis Peak B. Sharon Seller, RN,                            Isabella Stalling, Technician Referring MD:              Medicines:                Midazolam 8.3 mg IV, Meperidine 75 mg IV Complications:            No immediate complications. Estimated Blood Loss:     Estimated blood loss was minimal. Procedure:                Pre-Anesthesia Assessment:                           - Prior to the procedure, a History and Physical                            was performed, and patient medications and                            allergies were reviewed. The patient's tolerance of                            previous anesthesia was also reviewed. The risks                            and benefits of the procedure and the sedation                            options and risks were discussed with the patient.                            All questions were answered, and informed consent                            was obtained. Prior Anticoagulants: The patient has                            taken no previous anticoagulant or antiplatelet                            agents. ASA Grade Assessment: III - A patient with                            severe systemic disease. After reviewing the risks  and benefits, the patient was deemed in                            satisfactory condition to undergo the procedure.                           After obtaining informed consent, the endoscope was                            passed under direct vision. Throughout the                            procedure, the patient's blood pressure, pulse, and                            oxygen saturations were monitored continuously. The                            [id number]) scope was introduced through the                            mouth, and advanced to the second part of duodenum.                            The upper GI endoscopy was accomplished without                            difficulty. The patient tolerated the procedure                            well. Scope In: 2:03:44 PM Scope Out: 2:11:28 PM Total Procedure Duration: 0 hours 7 minutes 44 seconds  Findings:      Barrett's esophagus present from 35-37 cm. No nodularity. No       esophagitis. Stomach empty      Small hiatal hernia was present. normal gastric mucosa. Patent pylorus.       Normal-appearing first and second portion of the duodenum. Biopsies of       the abnormal distal esophagus taken for histologic study. Impression:               - Small hiatal hernia. Abnormal esophagus                            consistent with prior diagnosis of Barrett's                            esophagus?"biopsied                           - Moderate Sedation:      Moderate (conscious) sedation was administered by the endoscopy nurse       and supervised by the endoscopist. The following parameters were       monitored: oxygen saturation, heart rate, blood pressure, respiratory       rate, EKG, adequacy of pulmonary ventilation, and response to care.  Total physician intraservice time was 22 minutes. Recommendation:           - Patient has a contact number available for                            emergencies. The signs and symptoms of potential                            delayed complications were discussed with the                            patient. Return to normal activities tomorrow.                            Written discharge instructions were provided to the                            patient.                           - Advance diet as tolerated.                            - Continue present medications. Continue Protonix                            40 mg twice daily                           - Await pathology results. See colonoscopy eport. Procedure Code(s):        --- Professional ---                           [phone], Esophagogastroduodenoscopy, flexible,                            transoral; diagnostic, including collection of                            specimen(s) by brushing or washing, when performed                            (separate procedure)                           99152, Moderate sedation services provided by the                            same physician or other qualified health care                            professional performing the diagnostic or                            therapeutic service that the sedation supports,  requiring the presence of an independent trained                            observer to assist in the monitoring of the                            patient's level of consciousness and physiological                            status; initial 15 minutes of intraservice time,                            patient age 20 years or older Diagnosis Code(s):        --- Professional ---                           K44.9, Diaphragmatic hernia without obstruction or                            gangrene CPT copyright 2016 American Medical Association. All rights reserved. The codes documented in this report are preliminary and upon coder review may  be revised to meet current compliance requirements. Cristopher Estimable. Esa Raden, MD Norvel Richards, MD 09/13/2016 2:52:22 PM This report has been signed electronically. Number of Addenda: 0

## 2016-09-13 NOTE — Op Note (Signed)
[facility] Patient Name: [name] Procedure Date: 09/13/2016 2:14 PM MRN: [id number] Date of Birth: 1941-03-28 Attending MD: Norvel Richards , MD CSN: [id number] Age: 75 Admit Type: Outpatient Procedure:                Colonoscopy with multiple snare polypectomies Indications:              Screening for colorectal malignant neoplasm Providers:                Norvel Richards, MD, Gwenlyn Fudge RN, RN,                            Isabella Stalling, Technician Referring MD:              Medicines:                Midazolam 11 mg IV, Meperidine [id number] mg IV Complications:            No immediate complications. Estimated Blood Loss:     Estimated blood loss was minimal. Procedure:                Pre-Anesthesia Assessment:                           - Prior to the procedure, a History and Physical                            was performed, and patient medications and                            allergies were reviewed. The patient's tolerance of                            previous anesthesia was also reviewed. The risks                            and benefits of the procedure and the sedation                            options and risks were discussed with the patient.                            All questions were answered, and informed consent                            was obtained. Prior Anticoagulants: The patient has                            taken no previous anticoagulant or antiplatelet                            agents. ASA Grade Assessment: III - A patient with                            severe systemic disease. After reviewing the risks  and benefits, the patient was deemed in                            satisfactory condition to undergo the procedure.                           After obtaining informed consent, the colonoscope                            was passed under direct vision. Throughout the                            procedure, the  patient's blood pressure, pulse, and                            oxygen saturations were monitored continuously. The                            [id number] scope was introduced through                            the anus and advanced to the the cecum, identified                            by appendiceal orifice and ileocecal valve. The                            colonoscopy was performed without difficulty. The                            patient tolerated the procedure well. The quality                            of the bowel preparation was adequate. The                            ileocecal valve, appendiceal orifice, and rectum                            were photographed. Scope In: 2:17:46 PM Scope Out: 2:38:59 PM Scope Withdrawal Time: 0 hours 13 minutes 40 seconds  Total Procedure Duration: 0 hours 21 minutes 13 seconds  Findings:      The perianal and digital rectal examinations were normal.      Three semi-pedunculated polyps were found in the hepatic flexure and       cecum. The polyps were 5 to 6 mm in size. These polyps were removed with       a cold snare. Resection and retrieval were complete. Estimated blood       loss was minimal.      Multiple small-mouthed diverticula were found in the sigmoid colon and       descending colon. Estimated blood loss was minimal.      The exam was otherwise without abnormality on direct and retroflexion       views. Impression:               -  Three 5 to 6 mm polyps at the hepatic flexure and                            in the cecum, removed with a cold snare. Resected                            and retrieved.                           - Diverticulosis in the sigmoid colon and in the                            descending colon.                           - The examination was otherwise normal on direct                            and retroflexion views. Moderate Sedation:      Moderate (conscious) sedation was administered by the  endoscopy nurse       and supervised by the endoscopist. The following parameters were       monitored: oxygen saturation, heart rate, blood pressure, respiratory       rate, EKG, adequacy of pulmonary ventilation, and response to care.       Total physician intraservice time was 46 minutes. Recommendation:           - Patient has a contact number available for                            emergencies. The signs and symptoms of potential                            delayed complications were discussed with the                            patient. Return to normal activities tomorrow.                            Written discharge instructions were provided to the                            patient.                           - Resume previous diet.                           - Continue present medications.                           - Repeat colonoscopy date to be determined after                            pending pathology results are reviewed for  surveillance based on pathology results.                           - Return to GI office (date not yet determined).                            See EGD report. Procedure Code(s):        --- Professional ---                           [phone], Colonoscopy, flexible; with removal of                            tumor(s), polyp(s), or other lesion(s) by snare                            technique                           99152, Moderate sedation services provided by the                            same physician or other qualified health care                            professional performing the diagnostic or                            therapeutic service that the sedation supports,                            requiring the presence of an independent trained                            observer to assist in the monitoring of the                            patient's level of consciousness and physiological                             status; initial 15 minutes of intraservice time,                            patient age 50 years or older                           [phone], Moderate sedation services; each additional                            15 minutes intraservice time                           99153, Moderate sedation services; each additional  15 minutes intraservice time Diagnosis Code(s):        --- Professional ---                           D12.3, Benign neoplasm of transverse colon (hepatic                            flexure or splenic flexure)                           D12.0, Benign neoplasm of cecum                           K57.30, Diverticulosis of large intestine without                            perforation or abscess without bleeding CPT copyright 2016 American Medical Association. All rights reserved. The codes documented in this report are preliminary and upon coder review may  be revised to meet current compliance requirements. Cristopher Estimable. Wen Munford, MD Norvel Richards, MD 09/13/2016 2:58:25 PM This report has been signed electronically. Number of Addenda: 0

## 2016-09-17 ENCOUNTER — Encounter (HOSPITAL_COMMUNITY): Payer: Self-pay | Admitting: Internal Medicine

## 2016-09-17 ENCOUNTER — Encounter: Payer: Self-pay | Admitting: Internal Medicine

## 2016-09-18 DIAGNOSIS — N401 Enlarged prostate with lower urinary tract symptoms: Secondary | ICD-10-CM | POA: Diagnosis not present

## 2016-09-21 ENCOUNTER — Ambulatory Visit (INDEPENDENT_AMBULATORY_CARE_PROVIDER_SITE_OTHER): Payer: Medicare Other | Admitting: Urology

## 2016-09-21 DIAGNOSIS — R351 Nocturia: Secondary | ICD-10-CM | POA: Diagnosis not present

## 2016-09-21 DIAGNOSIS — N401 Enlarged prostate with lower urinary tract symptoms: Secondary | ICD-10-CM | POA: Diagnosis not present

## 2016-09-21 DIAGNOSIS — N5201 Erectile dysfunction due to arterial insufficiency: Secondary | ICD-10-CM | POA: Diagnosis not present

## 2016-09-21 DIAGNOSIS — R972 Elevated prostate specific antigen [PSA]: Secondary | ICD-10-CM

## 2016-09-25 DIAGNOSIS — M79673 Pain in unspecified foot: Secondary | ICD-10-CM | POA: Diagnosis not present

## 2016-09-25 DIAGNOSIS — G629 Polyneuropathy, unspecified: Secondary | ICD-10-CM | POA: Diagnosis not present

## 2016-09-25 DIAGNOSIS — M79671 Pain in right foot: Secondary | ICD-10-CM | POA: Diagnosis not present

## 2016-10-08 DIAGNOSIS — Z0000 Encounter for general adult medical examination without abnormal findings: Secondary | ICD-10-CM | POA: Diagnosis not present

## 2016-10-08 DIAGNOSIS — Z23 Encounter for immunization: Secondary | ICD-10-CM | POA: Diagnosis not present

## 2016-10-08 DIAGNOSIS — Z1389 Encounter for screening for other disorder: Secondary | ICD-10-CM | POA: Diagnosis not present

## 2016-10-08 DIAGNOSIS — Z6825 Body mass index (BMI) 25.0-25.9, adult: Secondary | ICD-10-CM | POA: Diagnosis not present

## 2016-11-09 DIAGNOSIS — J069 Acute upper respiratory infection, unspecified: Secondary | ICD-10-CM | POA: Diagnosis not present

## 2016-11-09 DIAGNOSIS — J029 Acute pharyngitis, unspecified: Secondary | ICD-10-CM | POA: Diagnosis not present

## 2016-11-09 DIAGNOSIS — J329 Chronic sinusitis, unspecified: Secondary | ICD-10-CM | POA: Diagnosis not present

## 2016-11-20 DIAGNOSIS — G5603 Carpal tunnel syndrome, bilateral upper limbs: Secondary | ICD-10-CM | POA: Diagnosis not present

## 2016-11-20 DIAGNOSIS — G4701 Insomnia due to medical condition: Secondary | ICD-10-CM | POA: Diagnosis not present

## 2016-11-20 DIAGNOSIS — M7591 Shoulder lesion, unspecified, right shoulder: Secondary | ICD-10-CM | POA: Diagnosis not present

## 2016-11-20 DIAGNOSIS — N411 Chronic prostatitis: Secondary | ICD-10-CM | POA: Diagnosis not present

## 2016-11-20 DIAGNOSIS — M5416 Radiculopathy, lumbar region: Secondary | ICD-10-CM | POA: Diagnosis not present

## 2016-11-20 DIAGNOSIS — M7592 Shoulder lesion, unspecified, left shoulder: Secondary | ICD-10-CM | POA: Diagnosis not present

## 2016-11-20 DIAGNOSIS — G2581 Restless legs syndrome: Secondary | ICD-10-CM | POA: Diagnosis not present

## 2016-11-20 DIAGNOSIS — N5082 Scrotal pain: Secondary | ICD-10-CM | POA: Diagnosis not present

## 2016-11-26 ENCOUNTER — Ambulatory Visit (INDEPENDENT_AMBULATORY_CARE_PROVIDER_SITE_OTHER): Payer: Medicare Other | Admitting: Otolaryngology

## 2016-11-26 DIAGNOSIS — R42 Dizziness and giddiness: Secondary | ICD-10-CM

## 2016-11-26 DIAGNOSIS — H9123 Sudden idiopathic hearing loss, bilateral: Secondary | ICD-10-CM

## 2016-11-26 DIAGNOSIS — H6121 Impacted cerumen, right ear: Secondary | ICD-10-CM | POA: Diagnosis not present

## 2016-11-26 DIAGNOSIS — H903 Sensorineural hearing loss, bilateral: Secondary | ICD-10-CM | POA: Diagnosis not present

## 2016-11-29 ENCOUNTER — Other Ambulatory Visit: Payer: Self-pay | Admitting: Nurse Practitioner

## 2016-12-06 ENCOUNTER — Ambulatory Visit (INDEPENDENT_AMBULATORY_CARE_PROVIDER_SITE_OTHER): Payer: Medicare Other | Admitting: Otolaryngology

## 2016-12-13 DIAGNOSIS — M5416 Radiculopathy, lumbar region: Secondary | ICD-10-CM | POA: Diagnosis not present

## 2016-12-13 DIAGNOSIS — M545 Low back pain: Secondary | ICD-10-CM | POA: Diagnosis not present

## 2017-01-14 DIAGNOSIS — K219 Gastro-esophageal reflux disease without esophagitis: Secondary | ICD-10-CM | POA: Diagnosis not present

## 2017-01-14 DIAGNOSIS — R5383 Other fatigue: Secondary | ICD-10-CM | POA: Diagnosis not present

## 2017-01-14 DIAGNOSIS — E782 Mixed hyperlipidemia: Secondary | ICD-10-CM | POA: Diagnosis not present

## 2017-01-14 DIAGNOSIS — R531 Weakness: Secondary | ICD-10-CM | POA: Diagnosis not present

## 2017-01-14 DIAGNOSIS — M545 Low back pain: Secondary | ICD-10-CM | POA: Diagnosis not present

## 2017-01-14 DIAGNOSIS — Z1389 Encounter for screening for other disorder: Secondary | ICD-10-CM | POA: Diagnosis not present

## 2017-01-14 DIAGNOSIS — Z6825 Body mass index (BMI) 25.0-25.9, adult: Secondary | ICD-10-CM | POA: Diagnosis not present

## 2017-01-18 DIAGNOSIS — M5416 Radiculopathy, lumbar region: Secondary | ICD-10-CM | POA: Diagnosis not present

## 2017-01-18 DIAGNOSIS — M545 Low back pain: Secondary | ICD-10-CM | POA: Diagnosis not present

## 2017-03-18 DIAGNOSIS — L820 Inflamed seborrheic keratosis: Secondary | ICD-10-CM | POA: Diagnosis not present

## 2017-03-18 DIAGNOSIS — D225 Melanocytic nevi of trunk: Secondary | ICD-10-CM | POA: Diagnosis not present

## 2017-03-18 DIAGNOSIS — L570 Actinic keratosis: Secondary | ICD-10-CM | POA: Diagnosis not present

## 2017-03-18 DIAGNOSIS — B078 Other viral warts: Secondary | ICD-10-CM | POA: Diagnosis not present

## 2017-03-18 DIAGNOSIS — X32XXXD Exposure to sunlight, subsequent encounter: Secondary | ICD-10-CM | POA: Diagnosis not present

## 2017-03-19 ENCOUNTER — Ambulatory Visit: Payer: Medicare Other | Admitting: Urology

## 2017-04-30 DIAGNOSIS — M109 Gout, unspecified: Secondary | ICD-10-CM | POA: Diagnosis not present

## 2017-04-30 DIAGNOSIS — K219 Gastro-esophageal reflux disease without esophagitis: Secondary | ICD-10-CM | POA: Diagnosis not present

## 2017-04-30 DIAGNOSIS — M1990 Unspecified osteoarthritis, unspecified site: Secondary | ICD-10-CM | POA: Diagnosis not present

## 2017-04-30 DIAGNOSIS — Z6825 Body mass index (BMI) 25.0-25.9, adult: Secondary | ICD-10-CM | POA: Diagnosis not present

## 2017-04-30 DIAGNOSIS — F419 Anxiety disorder, unspecified: Secondary | ICD-10-CM | POA: Diagnosis not present

## 2017-04-30 DIAGNOSIS — N529 Male erectile dysfunction, unspecified: Secondary | ICD-10-CM | POA: Diagnosis not present

## 2017-04-30 DIAGNOSIS — E782 Mixed hyperlipidemia: Secondary | ICD-10-CM | POA: Diagnosis not present

## 2017-04-30 DIAGNOSIS — E663 Overweight: Secondary | ICD-10-CM | POA: Diagnosis not present

## 2017-05-14 DIAGNOSIS — G2581 Restless legs syndrome: Secondary | ICD-10-CM | POA: Diagnosis not present

## 2017-05-14 DIAGNOSIS — G609 Hereditary and idiopathic neuropathy, unspecified: Secondary | ICD-10-CM | POA: Diagnosis not present

## 2017-05-14 DIAGNOSIS — M10071 Idiopathic gout, right ankle and foot: Secondary | ICD-10-CM | POA: Diagnosis not present

## 2017-06-11 DIAGNOSIS — M79671 Pain in right foot: Secondary | ICD-10-CM | POA: Diagnosis not present

## 2017-06-11 DIAGNOSIS — G629 Polyneuropathy, unspecified: Secondary | ICD-10-CM | POA: Diagnosis not present

## 2017-06-11 DIAGNOSIS — M722 Plantar fascial fibromatosis: Secondary | ICD-10-CM | POA: Diagnosis not present

## 2017-06-11 DIAGNOSIS — G2581 Restless legs syndrome: Secondary | ICD-10-CM | POA: Diagnosis not present

## 2017-07-02 DIAGNOSIS — M722 Plantar fascial fibromatosis: Secondary | ICD-10-CM | POA: Diagnosis not present

## 2017-07-02 DIAGNOSIS — M79671 Pain in right foot: Secondary | ICD-10-CM | POA: Diagnosis not present

## 2017-08-14 ENCOUNTER — Other Ambulatory Visit: Payer: Self-pay

## 2017-08-14 NOTE — Telephone Encounter (Signed)
Pt would like a 90 day supply

## 2017-08-16 ENCOUNTER — Other Ambulatory Visit: Payer: Self-pay | Admitting: Gastroenterology

## 2017-08-18 MED ORDER — PANTOPRAZOLE SODIUM 40 MG PO TBEC: 40.0000 mg | DELAYED_RELEASE_TABLET | Freq: Two times a day (BID) | ORAL | 3 refills | Status: DC

## 2017-08-20 DIAGNOSIS — G629 Polyneuropathy, unspecified: Secondary | ICD-10-CM | POA: Diagnosis not present

## 2017-08-20 DIAGNOSIS — M79671 Pain in right foot: Secondary | ICD-10-CM | POA: Diagnosis not present

## 2017-08-20 DIAGNOSIS — M722 Plantar fascial fibromatosis: Secondary | ICD-10-CM | POA: Diagnosis not present

## 2017-08-20 DIAGNOSIS — M109 Gout, unspecified: Secondary | ICD-10-CM | POA: Diagnosis not present

## 2017-09-24 DIAGNOSIS — M722 Plantar fascial fibromatosis: Secondary | ICD-10-CM | POA: Diagnosis not present

## 2017-09-24 DIAGNOSIS — M79671 Pain in right foot: Secondary | ICD-10-CM | POA: Diagnosis not present

## 2017-09-24 DIAGNOSIS — G629 Polyneuropathy, unspecified: Secondary | ICD-10-CM | POA: Diagnosis not present

## 2017-10-14 DIAGNOSIS — F419 Anxiety disorder, unspecified: Secondary | ICD-10-CM | POA: Diagnosis not present

## 2017-10-14 DIAGNOSIS — Z6825 Body mass index (BMI) 25.0-25.9, adult: Secondary | ICD-10-CM | POA: Diagnosis not present

## 2017-10-14 DIAGNOSIS — Z1389 Encounter for screening for other disorder: Secondary | ICD-10-CM | POA: Diagnosis not present

## 2017-10-14 DIAGNOSIS — R7301 Impaired fasting glucose: Secondary | ICD-10-CM | POA: Diagnosis not present

## 2017-10-14 DIAGNOSIS — E559 Vitamin D deficiency, unspecified: Secondary | ICD-10-CM | POA: Diagnosis not present

## 2017-10-14 DIAGNOSIS — Z125 Encounter for screening for malignant neoplasm of prostate: Secondary | ICD-10-CM | POA: Diagnosis not present

## 2017-10-14 DIAGNOSIS — K219 Gastro-esophageal reflux disease without esophagitis: Secondary | ICD-10-CM | POA: Diagnosis not present

## 2017-10-14 DIAGNOSIS — E663 Overweight: Secondary | ICD-10-CM | POA: Diagnosis not present

## 2017-10-14 DIAGNOSIS — Z23 Encounter for immunization: Secondary | ICD-10-CM | POA: Diagnosis not present

## 2017-10-14 DIAGNOSIS — N400 Enlarged prostate without lower urinary tract symptoms: Secondary | ICD-10-CM | POA: Diagnosis not present

## 2017-10-22 DIAGNOSIS — Z79891 Long term (current) use of opiate analgesic: Secondary | ICD-10-CM | POA: Diagnosis not present

## 2017-10-22 DIAGNOSIS — M545 Low back pain: Secondary | ICD-10-CM | POA: Diagnosis not present

## 2017-10-22 DIAGNOSIS — M5416 Radiculopathy, lumbar region: Secondary | ICD-10-CM | POA: Diagnosis not present

## 2017-10-22 DIAGNOSIS — G2581 Restless legs syndrome: Secondary | ICD-10-CM | POA: Diagnosis not present

## 2017-10-22 DIAGNOSIS — G894 Chronic pain syndrome: Secondary | ICD-10-CM | POA: Diagnosis not present

## 2017-10-25 ENCOUNTER — Ambulatory Visit (INDEPENDENT_AMBULATORY_CARE_PROVIDER_SITE_OTHER): Payer: Medicare Other | Admitting: Urology

## 2017-10-25 DIAGNOSIS — N5201 Erectile dysfunction due to arterial insufficiency: Secondary | ICD-10-CM

## 2017-10-25 DIAGNOSIS — R351 Nocturia: Secondary | ICD-10-CM | POA: Diagnosis not present

## 2017-10-25 DIAGNOSIS — N401 Enlarged prostate with lower urinary tract symptoms: Secondary | ICD-10-CM | POA: Diagnosis not present

## 2017-10-25 DIAGNOSIS — R972 Elevated prostate specific antigen [PSA]: Secondary | ICD-10-CM

## 2017-11-14 DIAGNOSIS — G600 Hereditary motor and sensory neuropathy: Secondary | ICD-10-CM | POA: Diagnosis not present

## 2017-11-14 DIAGNOSIS — M5416 Radiculopathy, lumbar region: Secondary | ICD-10-CM | POA: Diagnosis not present

## 2017-11-20 DIAGNOSIS — J019 Acute sinusitis, unspecified: Secondary | ICD-10-CM | POA: Diagnosis not present

## 2017-11-20 DIAGNOSIS — J301 Allergic rhinitis due to pollen: Secondary | ICD-10-CM | POA: Diagnosis not present

## 2017-11-20 DIAGNOSIS — Z6829 Body mass index (BMI) 29.0-29.9, adult: Secondary | ICD-10-CM | POA: Diagnosis not present

## 2017-11-20 DIAGNOSIS — E663 Overweight: Secondary | ICD-10-CM | POA: Diagnosis not present

## 2017-11-20 DIAGNOSIS — R319 Hematuria, unspecified: Secondary | ICD-10-CM | POA: Diagnosis not present

## 2018-04-15 DIAGNOSIS — C4442 Squamous cell carcinoma of skin of scalp and neck: Secondary | ICD-10-CM | POA: Diagnosis not present

## 2018-04-15 DIAGNOSIS — L570 Actinic keratosis: Secondary | ICD-10-CM | POA: Diagnosis not present

## 2018-04-15 DIAGNOSIS — X32XXXD Exposure to sunlight, subsequent encounter: Secondary | ICD-10-CM | POA: Diagnosis not present

## 2018-04-15 DIAGNOSIS — D225 Melanocytic nevi of trunk: Secondary | ICD-10-CM | POA: Diagnosis not present

## 2018-05-02 ENCOUNTER — Emergency Department (HOSPITAL_COMMUNITY)
Admission: EM | Admit: 2018-05-02 | Discharge: 2018-05-02 | Disposition: A | Discharge status: Home / Self Care | Payer: Medicare Other | Attending: Emergency Medicine | Admitting: Emergency Medicine

## 2018-05-02 ENCOUNTER — Emergency Department (HOSPITAL_COMMUNITY): Payer: Medicare Other

## 2018-05-02 ENCOUNTER — Encounter (HOSPITAL_COMMUNITY): Payer: Self-pay | Admitting: Emergency Medicine

## 2018-05-02 ENCOUNTER — Other Ambulatory Visit: Payer: Self-pay

## 2018-05-02 DIAGNOSIS — Z79899 Other long term (current) drug therapy: Secondary | ICD-10-CM | POA: Diagnosis not present

## 2018-05-02 DIAGNOSIS — I10 Essential (primary) hypertension: Secondary | ICD-10-CM | POA: Insufficient documentation

## 2018-05-02 DIAGNOSIS — Z1389 Encounter for screening for other disorder: Secondary | ICD-10-CM | POA: Diagnosis not present

## 2018-05-02 DIAGNOSIS — Z87891 Personal history of nicotine dependence: Secondary | ICD-10-CM | POA: Insufficient documentation

## 2018-05-02 DIAGNOSIS — R0602 Shortness of breath: Secondary | ICD-10-CM | POA: Insufficient documentation

## 2018-05-02 DIAGNOSIS — I4891 Unspecified atrial fibrillation: Secondary | ICD-10-CM | POA: Insufficient documentation

## 2018-05-02 DIAGNOSIS — Z6828 Body mass index (BMI) 28.0-28.9, adult: Secondary | ICD-10-CM | POA: Diagnosis not present

## 2018-05-02 DIAGNOSIS — E663 Overweight: Secondary | ICD-10-CM | POA: Diagnosis not present

## 2018-05-02 DIAGNOSIS — R000 Tachycardia, unspecified: Secondary | ICD-10-CM | POA: Diagnosis present

## 2018-05-02 LAB — BASIC METABOLIC PANEL
ANION GAP: 7 (ref 5–15)
BUN: 14 mg/dL (ref 6–20)
CHLORIDE: 109 mmol/L (ref 101–111)
CO2: 27 mmol/L (ref 22–32)
Calcium: 9.3 mg/dL (ref 8.9–10.3)
Creatinine, Ser: 1.2 mg/dL (ref 0.61–1.24)
GFR calc Af Amer: >60 mL/min (ref >60)
GFR calc non Af Amer: 57 mL/min — ABNORMAL LOW (ref >60)
Glucose, Bld: 143 mg/dL — ABNORMAL HIGH (ref 65–99)
POTASSIUM: 3.9 mmol/L (ref 3.5–5.1)
SODIUM: 143 mmol/L (ref 135–145)

## 2018-05-02 LAB — CBC
HEMATOCRIT: 44.2 % (ref 39.0–52.0)
HEMOGLOBIN: 15 g/dL (ref 13.0–17.0)
MCH: 31.7 pg (ref 26.0–34.0)
MCHC: 33.9 g/dL (ref 30.0–36.0)
MCV: 93.4 fL (ref 78.0–100.0)
Platelets: 174 10*3/uL (ref 150–400)
RBC: 4.73 MIL/uL (ref 4.22–5.81)
RDW: 13.2 % (ref 11.5–15.5)
WBC: 6.4 10*3/uL (ref 4.0–10.5)

## 2018-05-02 LAB — TSH: TSH: 3.235 u[IU]/mL (ref 0.350–4.500)

## 2018-05-02 LAB — BRAIN NATRIURETIC PEPTIDE: B Natriuretic Peptide: 179 pg/mL — ABNORMAL HIGH (ref 0.0–100.0)

## 2018-05-02 LAB — I-STAT TROPONIN, ED: Troponin i, poc: 0.01 ng/mL (ref 0.00–0.08)

## 2018-05-02 MED ORDER — RIVAROXABAN 10 MG PO TABS
ORAL_TABLET | ORAL | Status: AC
  Filled 2018-05-02: qty 2

## 2018-05-02 MED ORDER — RIVAROXABAN 20 MG PO TABS
20.0000 mg | ORAL_TABLET | Freq: Once | ORAL | Status: AC
  Administered 2018-05-02: 20 mg via ORAL
  Filled 2018-05-02: qty 1

## 2018-05-02 MED ORDER — RIVAROXABAN 20 MG PO TABS: 20.0000 mg | ORAL_TABLET | Freq: Every day | ORAL | 0 refills | Status: DC

## 2018-05-02 NOTE — ED Provider Notes (Signed)
[facility] EMERGENCY DEPARTMENT Provider Note   CSN: [id number] Arrival date & time: 05/02/18  1532     History   Chief Complaint Chief Complaint  Patient presents with  . Shortness of Breath    HPI [name] is a 77 y.o. male.  Patient is here for evaluation of new onset atrial fibrillation, sent from his PCP office.  He went to see his doctor today because of onset of weakness with tiredness, about a week ago.  He has not had any chest pain or significant shortness of breath.  Patient senses a irregular heartbeat, and sometimes feels like his heart races.  He continues to be able to do his usual activities of daily living.  Noticed similar problems previously over the last year but they are not persistent like this problem is today.  He denies fever, chills, cough, nausea, vomiting, focal weakness or paresthesia.  He is taking his usual medications.  HPI  Past Medical History:  Diagnosis Date  . Barrett's esophagus    short-segment, diagnosed in 2007 by Dr. [name]  . BPH (benign prostatic hyperplasia)   . GERD (gastroesophageal reflux disease)   . Hiatal hernia    small  . HTN (hypertension)   . S/P colonoscopy 2007   unremarkable  . S/P endoscopy Oct 2011    Salmon-colored epithelium coming up to 37 cm from the   . Vertigo     Patient Active Problem List   Diagnosis Date Noted  . DIZZINESS 05/09/2010  . CHEST PAIN 05/09/2010  . GERD 01/31/2009  . BARRETTS ESOPHAGUS 01/31/2009    Past Surgical History:  Procedure Laterality Date  . APPENDECTOMY    . CHOLECYSTECTOMY    . COLONOSCOPY  02/06/2006   Dr. [name]- single anal papilla, o/w normal rectum, normal colon  . COLONOSCOPY N/A 09/13/2016   Procedure: COLONOSCOPY;  Surgeon: [name], MD;  Location: [facility] END[facility];  Service: Endoscopy;  Laterality: N/A;  145   . ESOPHAGOGASTRODUODENOSCOPY  09/15/2010   Dr. [name]- Salmon-colored epithelium coming up to 37 cm from the   . ESOPHAGOGASTRODUODENOSCOPY N/A  07/02/2013   Dr. [name]- Barretts on bx. hiatal hernia  . ESOPHAGOGASTRODUODENOSCOPY N/A 09/13/2016   Procedure: ESOPHAGOGASTRODUODENOSCOPY (EGD);  Surgeon: [name], MD;  Location: [facility] ENDO SUITE;  Service: Endoscopy;  Laterality: N/A;  . ROTATOR CUFF REPAIR    . YAG LASER APPLICATION Right 3/0/1601   Procedure: YAG LASER APPLICATION;  Surgeon: [name], MD;  Location: [facility] ORS;  Service: Ophthalmology;  Laterality: Right;        Home Medications    Prior to Admission medications   Medication Sig Start Date End Date Taking? Authorizing Provider  ALPRAZolam Duanne Moron) 1 MG tablet Take 0.5 mg by mouth at bedtime as needed for sleep.    [provider]  fexofenadine (ALLEGRA) 180 MG tablet Take 180 mg by mouth every morning.    [provider]  gabapentin (NEURONTIN) 100 MG capsule Take 100 mg by mouth 2 (two) times daily. 07/25/16   [provider]  gabapentin (NEURONTIN) 300 MG capsule Take 600 mg by mouth 3 (three) times daily.  07/28/16   [provider]  ibuprofen (ADVIL,MOTRIN) 200 MG tablet Take 600 mg by mouth 2 (two) times daily as needed for mild pain.    [provider]  pantoprazole (PROTONIX) 40 MG tablet Take 1 tablet (40 mg total) by mouth 2 (two) times daily. 08/18/17   Mahala Menghini, PA-C  peg 3350 powder (MOVIPREP) 100 g SOLR Take 1 kit (200 g total) by mouth as directed. Patient taking differently: Take 1 kit by mouth as directed. WILL DO PRIOR TO PROCEDURE 08/29/16   Rourk, Cristopher Estimable, MD  rivaroxaban (XARELTO) 20 MG TABS tablet Take 1 tablet (20 mg total) by mouth daily with supper. 05/02/18   Daleen Bo, MD  Tamsulosin HCl (FLOMAX) 0.4 MG CAPS Take 0.4 mg by mouth 2 (two) times daily.  05/10/11   [provider]    Family History Family History  Problem Relation Age of Onset  . Prostate cancer Father        deceased  . Stroke Mother        deceased  . Colon cancer Neg Hx     Social History Social  History   Tobacco Use  . Smoking status: Former Smoker    Packs/day: 1.50    Years: 30.00    Pack years: 45.00    Types: Cigarettes    Last attempt to quit: 11/18/1993    Years since quitting: 24.4  . Smokeless tobacco: Never Used  . Tobacco comment: quit in the 90's  Substance Use Topics  . Alcohol use: No  . Drug use: No     Allergies   Patient has no known allergies.   Review of Systems Review of Systems   Physical Exam Updated Vital Signs BP 114/73 (BP Location: Left Arm)   Pulse (!) 55   Temp 97.8 F (36.6 C) (Oral)   Resp 20   Ht 5' 11"  (1.803 m)   Wt 96.2 kg (212 lb)   SpO2 98%   BMI 29.57 kg/m   Physical Exam   ED Treatments / Results  Labs (all labs ordered are listed, but only abnormal results are displayed) Labs Reviewed  BASIC METABOLIC PANEL - Abnormal; Notable for the following components:      Result Value   Glucose, Bld 143 (*)    GFR calc non Af Amer 57 (*)    All other components within normal limits  BRAIN NATRIURETIC PEPTIDE - Abnormal; Notable for the following components:   B Natriuretic Peptide 179.0 (*)    All other components within normal limits  CBC  TSH  I-STAT TROPONIN, ED    EKG EKG Interpretation  Date/Time:  Friday May 02 2018 15:40:12 EDT Ventricular Rate:  88 PR Interval:    QRS Duration: 178 QT Interval:  371 QTC Calculation: 449 R Axis:   5 Text Interpretation:  Atrial fibrillation Nonspecific intraventricular conduction delay Since last tracing Atrial fibrillation is new Confirmed by Daleen Bo [phone]) on 05/02/2018 3:54:54 PM   Radiology Dg Chest 2 View  Result Date: 05/02/2018 CLINICAL DATA:  Short of breath EXAM: CHEST - 2 VIEW COMPARISON:  10/06/2015 FINDINGS: Cardiac enlargement without heart failure. Lungs clear without infiltrate effusion or mass. No interval change from the prior study. IMPRESSION: No active cardiopulmonary disease. Electronically Signed   By: Franchot Gallo M.D.   On:  05/02/2018 17:39    Procedures .Critical Care Performed by: Daleen Bo, MD Authorized by: Daleen Bo, MD   Critical care provider statement:    Critical care time (minutes):  35   Critical care start time:  05/02/2018 3:53 PM   Critical care end time:  05/02/2018 6:29 PM   Critical care time was exclusive of:  Separately billable procedures and treating other patients   Critical care was necessary to treat or prevent imminent or life-threatening deterioration of  the following conditions:  Cardiac failure   Critical care was time spent personally by me on the following activities:  Blood draw for specimens, development of treatment plan with patient or surrogate, discussions with consultants, evaluation of patient's response to treatment, examination of patient, obtaining history from patient or surrogate, ordering and performing treatments and interventions, ordering and review of laboratory studies, pulse oximetry, re-evaluation of patient's condition, review of old charts and ordering and review of radiographic studies   (including critical care time)  Medications Ordered in ED Medications  rivaroxaban (XARELTO) tablet 20 mg (has no administration in time range)     Initial Impression / Assessment and Plan / ED Course  I have reviewed the triage vital signs and the nursing notes.  Pertinent labs & imaging results that were available during my care of the patient were reviewed by me and considered in my medical decision making (see chart for details).  Clinical Course as of May 02 1824  Fri May 02, 2018  1607 I discussed the case with Dr. Riley Kill, who did not see the patient earlier today, but was able to help made with some shared planning.  Patient will be evaluated for unstable conditions, consider anticoagulation, then referred back to the office.  Dr. Riley Kill states that they will handle obtaining an echocardiogram and referral to cardiology as needed.   [EW]  1742 Normal    I-stat troponin, ED [EW]  1743 Normal  TSH [EW]  1743 Normal except glucose high 143, nonfasting level, and slightly low GFR, 57.  The creatinine is normal.  Basic metabolic panel(!) [EW]  9038 Mild elevation 179  Brain natriuretic peptide(!) [EW]  1743 Normal  CBC [EW]  1744 No acute disease, images reviewed.  Nonspecific mild cardiac enlargement  DG Chest 2 View [EW]    Clinical Course User Index [EW] Daleen Bo, MD     Patient Vitals for the past 24 hrs:  BP Temp Temp src Pulse Resp SpO2 Height Weight  05/02/18 1750 114/73 - - (!) 55 20 98 % - -  05/02/18 1542 (!) 153/90 97.8 F (36.6 C) Oral 85 (!) 21 96 % - -  05/02/18 1538 - - - - - - 5' 11"  (1.803 m) 96.2 kg (212 lb)    This patients CHA2DS2-VASc Score and unadjusted Ischemic Stroke Rate (% per year) is equal to 3.2 % stroke rate/year from a score of 3  Above score calculated as 1 point each if present [CHF, HTN, DM, Vascular=MI/PAD/Aortic Plaque, Age if 65-74, or Male] Above score calculated as 2 points each if present [Age > 75, or Stroke/TIA/TE] Score equals 3: +2 for age, +1 for hypertension.  This indicates the need for anticoagulation to prevent stroke, and TIA risk.    6:25 PM Reevaluation with update and discussion. After initial assessment and treatment, an updated evaluation reveals patient is comfortable and stable.  Blood pressure now normal.  Patient and family members updated on findings and plan.  Patient and family are agreeable to this plan.  Counseling given. all questions answered. Daleen Bo   Medical Decision Making: New onset atrial fibrillation, symptoms at least 1 week, therefore patient is not a candidate for urgent cardioversion.  He is hemodynamically stable without documented rapid heartbeat here.  Discussions with his PCP they did not have additional information, indicating an unstable cardiovascular situation.  PCP plans on managing the new onset atrial fibrillation, after  stabilization assessment in the emergency department.  Patient does not have any  clinical history or clinical findings suggestive is a valvular heart disease.  Doubt ACS, PE, pneumonia or metabolic instability.  CRITICAL CARE- yes Performed by: Daleen Bo   Nursing Notes Reviewed/ Care Coordinated Applicable Imaging Reviewed Interpretation of Laboratory Data incorporated into ED treatment  The patient appears reasonably screened and/or stabilized for discharge and I doubt any other medical condition or other [facility] requiring further screening, evaluation, or treatment in the ED at this time prior to discharge.  Plan: Home Medications-continue usual medications; Home Treatments-precautions to avoid injury since on anticoagulant, rest, fluids; return here if the recommended treatment, does not improve the symptoms; Recommended follow up-PCP as soon as possible to arrange further care and management of new onset atrial fibrillation     Final Clinical Impressions(s) / ED Diagnoses   Final diagnoses:  New onset atrial fibrillation [facility])    ED Discharge Orders        Ordered    rivaroxaban (XARELTO) 20 MG TABS tablet  Daily with supper     05/02/18 1820       Daleen Bo, MD 05/02/18 1829

## 2018-05-02 NOTE — ED Notes (Signed)
edp notified of pt's HR, vo to ambulate pt and obtain hr while ambulating.

## 2018-05-02 NOTE — Discharge Instructions (Addendum)
Your cardiac condition is described as "new onset atrial fibrillation."  There are many potential reasons for this, and your doctor will work to help you find out why, and initiate appropriate treatment for it.  Please call them for a follow-up appointment as soon as you can on Monday morning.  I expect that they will offer further evaluation with a cardiac echo, and referral to a cardiologist, all to be done next week.  In the meantime, make sure that you are getting plenty of rest, and drinking a lot of fluids, to improve your strength and stamina.  We are starting you on a blood thinner, also known as anticoagulant, Xarelto.  We are giving you the first dose tonight.  A prescription has been sent to your pharmacy, to obtain and begin taking tomorrow night with your supper.  This medicine can thin your blood and cause bleeding so be careful that you do not injure yourself.  It is okay to get some gentle exercise, such as walking in the yard or on a sidewalk, but avoid exerting yourself for now.  Return here if needed for problems including chest pain, dizziness, worsening weakness, rapid heartbeat that does not improve with rest, or other concerns which you might develop.

## 2018-05-02 NOTE — ED Triage Notes (Signed)
Patient complaining of shortness of breath and generalized weakness x 1 week. States Dr Hilma Favors sent him over for new onset a-fib. Denies chest pain.

## 2018-05-02 NOTE — ED Notes (Signed)
Hr ranged from 86-90 during ambulation, edp notified.

## 2018-05-06 DIAGNOSIS — R5383 Other fatigue: Secondary | ICD-10-CM | POA: Diagnosis not present

## 2018-05-06 DIAGNOSIS — D519 Vitamin B12 deficiency anemia, unspecified: Secondary | ICD-10-CM | POA: Diagnosis not present

## 2018-05-06 DIAGNOSIS — E559 Vitamin D deficiency, unspecified: Secondary | ICD-10-CM | POA: Diagnosis not present

## 2018-05-06 DIAGNOSIS — R0600 Dyspnea, unspecified: Secondary | ICD-10-CM | POA: Diagnosis not present

## 2018-05-06 DIAGNOSIS — E663 Overweight: Secondary | ICD-10-CM | POA: Diagnosis not present

## 2018-05-06 DIAGNOSIS — I4891 Unspecified atrial fibrillation: Secondary | ICD-10-CM | POA: Diagnosis not present

## 2018-05-06 DIAGNOSIS — Z6828 Body mass index (BMI) 28.0-28.9, adult: Secondary | ICD-10-CM | POA: Diagnosis not present

## 2018-05-08 ENCOUNTER — Encounter: Payer: Self-pay | Admitting: Cardiovascular Disease

## 2018-05-08 ENCOUNTER — Ambulatory Visit (INDEPENDENT_AMBULATORY_CARE_PROVIDER_SITE_OTHER): Payer: Medicare Other | Admitting: Cardiovascular Disease

## 2018-05-08 VITALS — BP 130/78 | HR 82 | Ht 71.0 in | Wt 213.0 lb

## 2018-05-08 DIAGNOSIS — Z9289 Personal history of other medical treatment: Secondary | ICD-10-CM | POA: Diagnosis not present

## 2018-05-08 DIAGNOSIS — R0609 Other forms of dyspnea: Secondary | ICD-10-CM

## 2018-05-08 DIAGNOSIS — Z7189 Other specified counseling: Secondary | ICD-10-CM

## 2018-05-08 DIAGNOSIS — I4891 Unspecified atrial fibrillation: Secondary | ICD-10-CM | POA: Diagnosis not present

## 2018-05-08 DIAGNOSIS — R5383 Other fatigue: Secondary | ICD-10-CM

## 2018-05-08 NOTE — Patient Instructions (Addendum)
Your physician recommends that you schedule a follow-up appointment in: 2 months with [facility]    Your physician has requested that you have an echocardiogram. Echocardiography is a painless test that uses sound waves to create images of your heart. It provides your doctor with information about the size and shape of your heart and how well your heart's chambers and valves are working. This procedure takes approximately one hour. There are no restrictions for this procedure.  Your physician has recommended that you wear an event monitor. Event monitors are medical devices that record the heart's electrical activity. Doctors most often [place] these monitors to diagnose arrhythmias. Arrhythmias are problems with the speed or rhythm of the heartbeat. The monitor is a small, portable device. You can wear one while you do your normal daily activities. This is usually used to diagnose what is causing palpitations/syncope (passing out).    Please bring your social security statement to me to copy for xarelto assistance program      Thank you for choosing [facility] !

## 2018-05-08 NOTE — Progress Notes (Signed)
CARDIOLOGY CONSULT NOTE  Patient ID: [name] MRN: [id number] DOB/AGE: 76-24-1942 76 y.o.  Admit date: (Not on file) Primary Physician: [facility] Referring Physician: Dr. Gerarda Fraction  Reason for Consultation: New onset atrial fibrillation  HPI: [name] is a 77 y.o. male who is being seen today for the evaluation of new onset atrial fibrillation at the request of Redmond School, MD.   He was evaluated in the ED on 05/02/2018 for shortness of breath and new onset atrial fibrillation.  I reviewed extensive documentation, labs, and studies.  He previously had seen his PCP for weakness and tiredness but sensed an irregular heartbeat and palpitations.  Relevant labs: Sodium 143, potassium 3.9, BUN 14, creatinine 1.2, normal CBC, BNP mildly elevated at 179, normal TSH, and normal troponin.  Chest x-ray showed no active cardiopulmonary disease.  He is currently on systemic anticoagulation with Xarelto 20 mg.  I personally reviewed the ECG performed on 05/02/2018 which demonstrated rate controlled atrial fibrillation.  He said he has felt weak and tired for the past 3 months.  While he denies exertional chest pain he does complain of exertional fatigue.  He enjoys golfing but has not been able to finish a round of golf lately.  He does have episodic lightheadedness but denies syncope.  He has episodic palpitations.  He denies leg swelling, orthopnea, and paroxysmal nocturnal dyspnea.   No Known Allergies  Current Outpatient Medications  Medication Sig Dispense Refill  . ALPRAZolam (XANAX) 1 MG tablet Take 0.5 mg by mouth at bedtime as needed for sleep.    . fexofenadine (ALLEGRA) 180 MG tablet Take 180 mg by mouth every morning.    . pantoprazole (PROTONIX) 40 MG tablet Take 1 tablet (40 mg total) by mouth 2 (two) times daily. 180 tablet 3  . rivaroxaban (XARELTO) 20 MG TABS tablet Take 1 tablet (20 mg total) by mouth daily with supper. 30 tablet 0    . Tamsulosin HCl (FLOMAX) 0.4 MG CAPS Take 0.4 mg by mouth 2 (two) times daily.      No current facility-administered medications for this visit.     Past Medical History:  Diagnosis Date  . Barrett's esophagus    short-segment, diagnosed in 2007 by Dr. [name]  . BPH (benign prostatic hyperplasia)   . GERD (gastroesophageal reflux disease)   . Hiatal hernia    small  . HTN (hypertension)   . S/P colonoscopy 2007   unremarkable  . S/P endoscopy Oct 2011    Salmon-colored epithelium coming up to 37 cm from the   . Vertigo     Past Surgical History:  Procedure Laterality Date  . APPENDECTOMY    . CHOLECYSTECTOMY    . COLONOSCOPY  02/06/2006   Dr. [name]- single anal papilla, o/w normal rectum, normal colon  . COLONOSCOPY N/A 09/13/2016   Procedure: COLONOSCOPY;  Surgeon: [name], MD;  Location: [facility];  Service: Endoscopy;  Laterality: N/A;  145   . ESOPHAGOGASTRODUODENOSCOPY  09/15/2010   Dr. [name]- Salmon-colored epithelium coming up to 37 cm from the   . ESOPHAGOGASTRODUODENOSCOPY N/A 07/02/2013   Dr. [name]- Barretts on bx. hiatal hernia  . ESOPHAGOGASTRODUODENOSCOPY N/A 09/13/2016   Procedure: ESOPHAGOGASTRODUODENOSCOPY (EGD);  Surgeon: [name], MD;  Location: [facility];  Service: Endoscopy;  Laterality: N/A;  . ROTATOR CUFF REPAIR    . YAG LASER APPLICATION Right 0/06/6577   Procedure: YAG LASER APPLICATION;  Surgeon: Debe Coder  Iona Hansen, MD;  Location: [facility] ORS;  Service: Ophthalmology;  Laterality: Right;    Social History   Socioeconomic History  . Marital status: Married    Spouse name: Not on file  . Number of children: Not on file  . Years of education: Not on file  . Highest education level: Not on file  Occupational History  . Not on file  Social Needs  . Financial resource strain: Not on file  . Food insecurity:    Worry: Not on file    Inability: Not on file  . Transportation needs:    Medical: Not on file    Non-medical: Not on  file  Tobacco Use  . Smoking status: Former Smoker    Packs/day: 1.50    Years: 30.00    Pack years: 45.00    Types: Cigarettes    Last attempt to quit: 11/18/1993    Years since quitting: 24.4  . Smokeless tobacco: Never Used  . Tobacco comment: quit in the 90's  Substance and Sexual Activity  . Alcohol use: No  . Drug use: No  . Sexual activity: Not on file  Lifestyle  . Physical activity:    Days per week: Not on file    Minutes per session: Not on file  . Stress: Not on file  Relationships  . Social connections:    Talks on phone: Not on file    Gets together: Not on file    Attends religious service: Not on file    Active member of club or organization: Not on file    Attends meetings of clubs or organizations: Not on file    Relationship status: Not on file  . Intimate partner violence:    Fear of current or ex partner: Not on file    Emotionally abused: Not on file    Physically abused: Not on file    Forced sexual activity: Not on file  Other Topics Concern  . Not on file  Social History Narrative  . Not on file     No family history of premature CAD in 1st degree relatives.  Current Meds  Medication Sig  . ALPRAZolam (XANAX) 1 MG tablet Take 0.5 mg by mouth at bedtime as needed for sleep.  . fexofenadine (ALLEGRA) 180 MG tablet Take 180 mg by mouth every morning.  . pantoprazole (PROTONIX) 40 MG tablet Take 1 tablet (40 mg total) by mouth 2 (two) times daily.  . rivaroxaban (XARELTO) 20 MG TABS tablet Take 1 tablet (20 mg total) by mouth daily with supper.  . Tamsulosin HCl (FLOMAX) 0.4 MG CAPS Take 0.4 mg by mouth 2 (two) times daily.   . [DISCONTINUED] gabapentin (NEURONTIN) 300 MG capsule Take 600 mg by mouth 3 (three) times daily.   . [DISCONTINUED] ibuprofen (ADVIL,MOTRIN) 200 MG tablet Take 600 mg by mouth 2 (two) times daily as needed for mild pain.      Review of systems complete and found to be negative unless listed above in HPI    Physical  exam Blood pressure 130/78, pulse 82, height 5\' 11"  (1.803 m), weight 213 lb (96.6 kg), SpO2 96 %. General: NAD Neck: No JVD, no thyromegaly or thyroid nodule.  Lungs: Clear to auscultation bilaterally with normal respiratory effort. CV: Nondisplaced PMI. Regular rate and rhythm, normal S1/S2, no S3/S4, no murmur.  No peripheral edema.  No carotid bruit.    Abdomen: Soft, nontender, no distention.  Skin: Intact without lesions or rashes.  Neurologic: Alert and  oriented x 3.  Psych: Normal affect. Extremities: No clubbing or cyanosis.  HEENT: Normal.   ECG: Most recent ECG reviewed.   Labs: Lab Results  Component Value Date/Time   K 3.9 05/02/2018 03:41 PM   BUN 14 05/02/2018 03:41 PM   CREATININE 1.20 05/02/2018 03:41 PM   ALT 18 05/14/2014 03:18 PM   TSH 3.235 05/02/2018 03:41 PM   HGB 15.0 05/02/2018 03:41 PM     Lipids: No results found for: LDLCALC, LDLDIRECT, CHOL, TRIG, HDL      ASSESSMENT AND PLAN:  1.  New onset atrial fibrillation: He is currently in a regular rhythm.  He is anticoagulated with Xarelto.  He said this is very costly for him.  We will look into applying for the patient assistance program.  He does have episodic palpitations and exertional fatigue.  I will obtain an echocardiogram to evaluate cardiac structure and function.  I will also obtain a 1 week event monitor. I spoke to him about the importance of systemic anticoagulation for thromboembolic risk reduction.  2.  Exertional dyspnea and fatigue: He is currently in a regular rhythm thus pointing to a different etiology than atrial fibrillation.  I will obtain a 1 week event monitor to assess for atrial fibrillation burden.  I will also obtain an echocardiogram to evaluate cardiac structure and function.  I would consider nuclear stress testing to evaluate for occult ischemic heart disease.     Disposition: Follow up in 2 months   Signed: Kate Sable, M.D., F.A.C.C.  05/08/2018, 3:09 PM

## 2018-05-13 ENCOUNTER — Ambulatory Visit (INDEPENDENT_AMBULATORY_CARE_PROVIDER_SITE_OTHER): Payer: Medicare Other

## 2018-05-13 ENCOUNTER — Ambulatory Visit (HOSPITAL_COMMUNITY)
Admission: RE | Admit: 2018-05-13 | Discharge: 2018-05-13 | Disposition: A | Discharge status: Home / Self Care | Payer: Medicare Other | Source: Ambulatory Visit | Attending: Cardiovascular Disease | Admitting: Cardiovascular Disease

## 2018-05-13 DIAGNOSIS — I4891 Unspecified atrial fibrillation: Secondary | ICD-10-CM

## 2018-05-13 DIAGNOSIS — R079 Chest pain, unspecified: Secondary | ICD-10-CM | POA: Insufficient documentation

## 2018-05-13 DIAGNOSIS — K219 Gastro-esophageal reflux disease without esophagitis: Secondary | ICD-10-CM | POA: Insufficient documentation

## 2018-05-13 NOTE — Progress Notes (Signed)
*  PRELIMINARY RESULTS* Echocardiogram 2D Echocardiogram has been performed.  [name] 05/13/2018, 2:14 PM

## 2018-05-14 ENCOUNTER — Telehealth: Payer: Self-pay | Admitting: Cardiovascular Disease

## 2018-05-14 NOTE — Telephone Encounter (Signed)
Patient has questions regarding monitor / tg

## 2018-05-14 NOTE — Telephone Encounter (Signed)
Called pt. He wanted to be sure he only had to charge monitor 1 hour.

## 2018-05-16 ENCOUNTER — Telehealth: Payer: Self-pay | Admitting: *Deleted

## 2018-05-16 NOTE — Telephone Encounter (Signed)
-----   Message from Herminio Commons, MD sent at 05/14/2018 12:37 PM EDT ----- Normal pumping function.

## 2018-05-16 NOTE — Telephone Encounter (Signed)
Patient informed. 

## 2018-05-19 DIAGNOSIS — R7989 Other specified abnormal findings of blood chemistry: Secondary | ICD-10-CM | POA: Diagnosis not present

## 2018-05-23 ENCOUNTER — Telehealth: Payer: Self-pay | Admitting: *Deleted

## 2018-05-23 MED ORDER — METOPROLOL TARTRATE 25 MG PO TABS: 25.0000 mg | ORAL_TABLET | Freq: Two times a day (BID) | ORAL | 3 refills | Status: DC

## 2018-05-23 NOTE — Telephone Encounter (Signed)
-----   Message from Herminio Commons, MD sent at 05/21/2018  2:36 PM EDT ----- Atrial fibrillation and flutter seen with occasional rapid heart rates.  Start metoprolol 25 mg twice daily.

## 2018-06-09 ENCOUNTER — Telehealth: Payer: Self-pay | Admitting: *Deleted

## 2018-06-09 NOTE — Telephone Encounter (Signed)
Called to check the states of Xarelto Pt assistance. Spoke with Alexia who states that the application is still in processing.

## 2018-06-24 ENCOUNTER — Telehealth: Payer: Self-pay | Admitting: Cardiovascular Disease

## 2018-06-24 DIAGNOSIS — I4891 Unspecified atrial fibrillation: Secondary | ICD-10-CM | POA: Insufficient documentation

## 2018-06-24 MED ORDER — RIVAROXABAN 20 MG PO TABS: 20.0000 mg | ORAL_TABLET | Freq: Every day | ORAL | 0 refills | Status: DC

## 2018-06-24 NOTE — Telephone Encounter (Signed)
Can the patient get into anticoagulation clinic within the 2 week timeframe in which Xarelto samples were provided? If so, I will defer warfarin dosing.

## 2018-06-24 NOTE — Telephone Encounter (Signed)
Wanting to speak w/ someone concerning his medications

## 2018-06-24 NOTE — Telephone Encounter (Signed)
Per Tye Maryland at [place] [facility], patient was denied for xarelto assistance on 06/21/18 due to over income. For 2 people can't exceed $50,730 and income for patient's household of 2 people was reported at $64,000.   Patient informed and is requesting to be switched to coumadin because he can't afford xarelto. 2 week supply of xarelto samples provided for patient.  Please advise of coumadin dose, directions. Patient will be enrolled into the coumadin clinic.

## 2018-06-24 NOTE — Telephone Encounter (Signed)
Calling to check the status of xarelto patient assistance.

## 2018-06-24 NOTE — Telephone Encounter (Signed)
Patient informed and verbalized understanding. Seeing [name] on 07/02/18.

## 2018-07-02 ENCOUNTER — Ambulatory Visit (INDEPENDENT_AMBULATORY_CARE_PROVIDER_SITE_OTHER): Payer: Medicare Other | Admitting: *Deleted

## 2018-07-02 DIAGNOSIS — Z5181 Encounter for therapeutic drug level monitoring: Secondary | ICD-10-CM

## 2018-07-02 DIAGNOSIS — I4891 Unspecified atrial fibrillation: Secondary | ICD-10-CM

## 2018-07-02 LAB — POCT INR: INR: 1.3 — AB (ref 2.0–3.0)

## 2018-07-02 MED ORDER — WARFARIN SODIUM 5 MG PO TABS: 5.0000 mg | ORAL_TABLET | Freq: Every day | ORAL | 1 refills | Status: DC

## 2018-07-02 NOTE — Patient Instructions (Signed)
Here to transition from Xarelto to coumadin due to cost. Take Xarelto only tonight.  On 8/15, 8/16, 8/17 take coumadin 5mg  along with Xarelto.  On 8/18 continue coumadin 5mg  daily and stop Xarelto.  Recheck INR in 1 week at Dr Raliegh Ip appt. Pt verbalized understanding.

## 2018-07-08 DIAGNOSIS — M109 Gout, unspecified: Secondary | ICD-10-CM | POA: Diagnosis not present

## 2018-07-09 ENCOUNTER — Ambulatory Visit (INDEPENDENT_AMBULATORY_CARE_PROVIDER_SITE_OTHER): Payer: Medicare Other | Admitting: *Deleted

## 2018-07-09 ENCOUNTER — Ambulatory Visit (INDEPENDENT_AMBULATORY_CARE_PROVIDER_SITE_OTHER): Payer: Medicare Other | Admitting: Cardiovascular Disease

## 2018-07-09 ENCOUNTER — Encounter: Payer: Self-pay | Admitting: Cardiovascular Disease

## 2018-07-09 ENCOUNTER — Encounter: Payer: Self-pay | Admitting: *Deleted

## 2018-07-09 VITALS — BP 121/68 | HR 50 | Ht 70.0 in | Wt 211.0 lb

## 2018-07-09 DIAGNOSIS — I4892 Unspecified atrial flutter: Secondary | ICD-10-CM

## 2018-07-09 DIAGNOSIS — R0609 Other forms of dyspnea: Secondary | ICD-10-CM | POA: Diagnosis not present

## 2018-07-09 DIAGNOSIS — R5383 Other fatigue: Secondary | ICD-10-CM | POA: Diagnosis not present

## 2018-07-09 DIAGNOSIS — I480 Paroxysmal atrial fibrillation: Secondary | ICD-10-CM | POA: Diagnosis not present

## 2018-07-09 DIAGNOSIS — I4891 Unspecified atrial fibrillation: Secondary | ICD-10-CM

## 2018-07-09 DIAGNOSIS — Z5181 Encounter for therapeutic drug level monitoring: Secondary | ICD-10-CM

## 2018-07-09 LAB — POCT INR: INR: 1.7 — AB (ref 2.0–3.0)

## 2018-07-09 MED ORDER — METOPROLOL TARTRATE 25 MG PO TABS: 12.5000 mg | ORAL_TABLET | Freq: Two times a day (BID) | ORAL | 3 refills | Status: DC

## 2018-07-09 NOTE — Progress Notes (Signed)
SUBJECTIVE: The patient presents for follow up of atrial fibrillation.  He cannot afford Xarelto and requested he be placed on warfarin.  Event monitoring showed atrial fibrillation and flutter with episodic tachycardia. I started metoprolol 25 mg bid.  Echocardiogram showed normal LV systolic function, EF 52-77%. Left atrial size was normal.  He is bradycardic today but did not feel any worse after starting Lopressor.  He denies chest pain and dizziness.  He has occasional palpitations.  He continues to feel markedly fatigued.  He said he has to wheel himself out of bed.  He golfed last week but it was taxing for him.   Review of Systems: As per "subjective", otherwise negative.  No Known Allergies  Current Outpatient Medications  Medication Sig Dispense Refill  . ALPRAZolam (XANAX) 1 MG tablet Take 0.5 mg by mouth at bedtime as needed for sleep.    . fexofenadine (ALLEGRA) 180 MG tablet Take 180 mg by mouth every morning.    . metoprolol tartrate (LOPRESSOR) 25 MG tablet Take 1 tablet (25 mg total) by mouth 2 (two) times daily. 180 tablet 3  . pantoprazole (PROTONIX) 40 MG tablet Take 1 tablet (40 mg total) by mouth 2 (two) times daily. 180 tablet 3  . Tamsulosin HCl (FLOMAX) 0.4 MG CAPS Take 0.4 mg by mouth 2 (two) times daily.     [name] warfarin (COUMADIN) 5 MG tablet Take 1 tablet (5 mg total) by mouth daily at 6 PM. 45 tablet 1   No current facility-administered medications for this visit.     Past Medical History:  Diagnosis Date  . Barrett's esophagus    short-segment, diagnosed in 2007 by Dr. [name]  . BPH (benign prostatic hyperplasia)   . GERD (gastroesophageal reflux disease)   . Hiatal hernia    small  . HTN (hypertension)   . S/P colonoscopy 2007   unremarkable  . S/P endoscopy Oct 2011    Salmon-colored epithelium coming up to 37 cm from the   . Vertigo     Past Surgical History:  Procedure Laterality Date  . APPENDECTOMY    . CHOLECYSTECTOMY    .  COLONOSCOPY  02/06/2006   Dr. [name]- single anal papilla, o/w normal rectum, normal colon  . COLONOSCOPY N/A 09/13/2016   Procedure: COLONOSCOPY;  Surgeon: [name], MD;  Location: [facility];  Service: Endoscopy;  Laterality: N/A;  145   . ESOPHAGOGASTRODUODENOSCOPY  09/15/2010   Dr. [name]- Salmon-colored epithelium coming up to 37 cm from the   . ESOPHAGOGASTRODUODENOSCOPY N/A 07/02/2013   Dr. [name]- Barretts on bx. hiatal hernia  . ESOPHAGOGASTRODUODENOSCOPY N/A 09/13/2016   Procedure: ESOPHAGOGASTRODUODENOSCOPY (EGD);  Surgeon: [name], MD;  Location: [facility];  Service: Endoscopy;  Laterality: N/A;  . ROTATOR CUFF REPAIR    . YAG LASER APPLICATION Right 06/20/4234   Procedure: YAG LASER APPLICATION;  Surgeon: [name], MD;  Location: [facility];  Service: Ophthalmology;  Laterality: Right;    Social History   Socioeconomic History  . Marital status: Married    Spouse name: Not on file  . Number of children: Not on file  . Years of education: Not on file  . Highest education level: Not on file  Occupational History  . Not on file  Social Needs  . Financial resource strain: Not on file  . Food insecurity:    Worry: Not on file    Inability: Not on file  . Transportation needs:  Medical: Not on file    Non-medical: Not on file  Tobacco Use  . Smoking status: Former Smoker    Packs/day: 1.50    Years: 30.00    Pack years: 45.00    Types: Cigarettes    Last attempt to quit: 11/18/1993    Years since quitting: 24.6  . Smokeless tobacco: Never Used  . Tobacco comment: quit in the 90's  Substance and Sexual Activity  . Alcohol use: No  . Drug use: No  . Sexual activity: Not on file  Lifestyle  . Physical activity:    Days per week: Not on file    Minutes per session: Not on file  . Stress: Not on file  Relationships  . Social connections:    Talks on phone: Not on file    Gets together: Not on file    Attends religious service: Not on file      Active member of club or organization: Not on file    Attends meetings of clubs or organizations: Not on file    Relationship status: Not on file  . Intimate partner violence:    Fear of current or ex partner: Not on file    Emotionally abused: Not on file    Physically abused: Not on file    Forced sexual activity: Not on file  Other Topics Concern  . Not on file  Social History Narrative  . Not on file     Vitals:   07/09/18 1325  BP: 121/68  Pulse: (!) 50  SpO2: 98%  Weight: 211 lb (95.7 kg)  Height: 5\' 10"  (1.778 m)    Wt Readings from Last 3 Encounters:  07/09/18 211 lb (95.7 kg)  05/08/18 213 lb (96.6 kg)  05/02/18 212 lb (96.2 kg)     PHYSICAL EXAM General: NAD HEENT: Normal. Neck: No JVD, no thyromegaly. Lungs: Clear to auscultation bilaterally with normal respiratory effort. CV: Bradycardic, regular rhythm, normal S1/S2, no S3/S4, no murmur. No pretibial or periankle edema.     Abdomen: Soft, nontender, no distention.  Neurologic: Alert and oriented.  Psych: Normal affect. Skin: Normal. Musculoskeletal: No gross deformities.    ECG: Reviewed above under Subjective   Labs: Lab Results  Component Value Date/Time   K 3.9 05/02/2018 03:41 PM   BUN 14 05/02/2018 03:41 PM   CREATININE 1.20 05/02/2018 03:41 PM   ALT 18 05/14/2014 03:18 PM   TSH 3.235 05/02/2018 03:41 PM   HGB 15.0 05/02/2018 03:41 PM     Lipids: No results found for: LDLCALC, LDLDIRECT, CHOL, TRIG, HDL     ASSESSMENT AND PLAN: 1. Paroxysmal atrial fibrillation and flutter: He is bradycardic so I will reduce metoprolol tartrate to 12.5 mg twice daily. Unable to afford Xarelto. He has been transitioned to warfarin. I have enrolled him in anticoagulation clinic.  Left atrial size is normal.  If he continues to have problems with tachycardia-bradycardia, I would make an EP referral for consideration of ablation.  2.  Exertional dyspnea and fatigue: Symptoms have occurred when  patient was in sinus rhythm.  The symptoms preceded the institution of metoprolol.  I will obtain an exercise Myoview stress test to evaluate for ischemic heart disease.    Disposition: Follow up 6-8 weeks   Kate Sable, M.D., F.A.C.C.

## 2018-07-09 NOTE — Addendum Note (Signed)
Addended by: Debbora Lacrosse R on: 07/09/2018 01:56 PM   Modules accepted: Orders

## 2018-07-09 NOTE — Patient Instructions (Signed)
Description   Continue taking 5mg  (1 tablet) everyday. Recheck on Monday. Call us with any new medications, changes or any bleeding concerns @ [phone].

## 2018-07-09 NOTE — Patient Instructions (Signed)
Medication Instructions:  START LOPRESSOR 12.5 MG TWO TIMES DAILY   Labwork: NONE  Testing/Procedures: Your physician has requested that you have en exercise stress myoview. For further information please visit [email or web address]. Please follow instruction sheet, as given.     Follow-Up: Your physician recommends that you schedule a follow-up appointment in: 6-8 WEEKS    Any Other Special Instructions Will Be Listed Below (If Applicable).     If you need a refill on your cardiac medications before your next appointment, please call your pharmacy.

## 2018-07-14 ENCOUNTER — Ambulatory Visit (INDEPENDENT_AMBULATORY_CARE_PROVIDER_SITE_OTHER): Payer: Medicare Other | Admitting: *Deleted

## 2018-07-14 DIAGNOSIS — Z5181 Encounter for therapeutic drug level monitoring: Secondary | ICD-10-CM

## 2018-07-14 DIAGNOSIS — I4891 Unspecified atrial fibrillation: Secondary | ICD-10-CM | POA: Diagnosis not present

## 2018-07-14 LAB — POCT INR: INR: 2.4 (ref 2.0–3.0)

## 2018-07-14 NOTE — Patient Instructions (Signed)
Continue taking 5mg  (1 tablet) everyday. On prednisone taper.  Has 4 days left (2mg  x 1 day and 1mg  x 3 days) Call us with any new medications, changes or any bleeding concerns @ [phone].  Recheck in 10 days

## 2018-07-16 ENCOUNTER — Encounter (HOSPITAL_BASED_OUTPATIENT_CLINIC_OR_DEPARTMENT_OTHER)
Admission: RE | Admit: 2018-07-16 | Discharge: 2018-07-16 | Disposition: A | Discharge status: Home / Self Care | Payer: Medicare Other | Source: Ambulatory Visit | Attending: Cardiovascular Disease | Admitting: Cardiovascular Disease

## 2018-07-16 ENCOUNTER — Ambulatory Visit (HOSPITAL_COMMUNITY)
Admission: RE | Admit: 2018-07-16 | Discharge: 2018-07-16 | Disposition: A | Discharge status: Home / Self Care | Payer: Medicare Other | Source: Ambulatory Visit | Attending: Cardiovascular Disease | Admitting: Cardiovascular Disease

## 2018-07-16 ENCOUNTER — Encounter (HOSPITAL_COMMUNITY): Payer: Self-pay

## 2018-07-16 DIAGNOSIS — R0609 Other forms of dyspnea: Secondary | ICD-10-CM | POA: Diagnosis not present

## 2018-07-16 LAB — NM MYOCAR MULTI W/SPECT W/WALL MOTION / EF
CHL RATE OF PERCEIVED EXERTION: 13
Estimated workload: 7 METS
Exercise duration (min): 5 min
Exercise duration (sec): 30 s
LHR: 0.38
LV sys vol: 31 mL
LVDIAVOL: 87 mL (ref 62–150)
MPHR: 155 {beats}/min
NUC STRESS TID: 0.95
Peak HR: 133 {beats}/min
Percent HR: 92 %
Rest HR: 47 {beats}/min
SDS: 0
SRS: 0
SSS: 0

## 2018-07-16 MED ORDER — TECHNETIUM TC 99M TETROFOSMIN IV KIT
30.0000 | PACK | Freq: Once | INTRAVENOUS | Status: AC | PRN
  Administered 2018-07-16: 28 via INTRAVENOUS

## 2018-07-16 MED ORDER — REGADENOSON 0.4 MG/5ML IV SOLN
INTRAVENOUS | Status: AC
  Filled 2018-07-16: qty 5

## 2018-07-16 MED ORDER — TECHNETIUM TC 99M TETROFOSMIN IV KIT
10.0000 | PACK | Freq: Once | INTRAVENOUS | Status: AC | PRN
  Administered 2018-07-16: 10.9 via INTRAVENOUS

## 2018-07-16 MED ORDER — SODIUM CHLORIDE 0.9% FLUSH
INTRAVENOUS | Status: AC
  Administered 2018-07-16: 10 mL via INTRAVENOUS
  Filled 2018-07-16: qty 10

## 2018-07-19 DIAGNOSIS — M109 Gout, unspecified: Secondary | ICD-10-CM | POA: Diagnosis not present

## 2018-07-19 DIAGNOSIS — I4891 Unspecified atrial fibrillation: Secondary | ICD-10-CM | POA: Diagnosis not present

## 2018-07-23 ENCOUNTER — Ambulatory Visit (INDEPENDENT_AMBULATORY_CARE_PROVIDER_SITE_OTHER): Payer: Medicare Other | Admitting: *Deleted

## 2018-07-23 DIAGNOSIS — I4891 Unspecified atrial fibrillation: Secondary | ICD-10-CM

## 2018-07-23 DIAGNOSIS — Z5181 Encounter for therapeutic drug level monitoring: Secondary | ICD-10-CM | POA: Diagnosis not present

## 2018-07-23 LAB — POCT INR: INR: 5.8 — AB (ref 2.0–3.0)

## 2018-07-23 NOTE — Patient Instructions (Signed)
Hold coumadin x 3 days ( W,Th,F) then take 1 tablet daily except 1/2 tablet on Monday and come for INR check on 07/30/18   On prednisone 10mg  taper for gout. Started 07/20/18.  Take 60mg  x 4 days, 40mg  x 4 days, 20mg  x 4 days.  Will finish on 07/31/18  Call us with any new medications, changes or any bleeding concerns @ [phone].  Recheck in 1 week

## 2018-07-30 ENCOUNTER — Ambulatory Visit (INDEPENDENT_AMBULATORY_CARE_PROVIDER_SITE_OTHER): Payer: Medicare Other | Admitting: *Deleted

## 2018-07-30 ENCOUNTER — Telehealth: Payer: Self-pay | Admitting: *Deleted

## 2018-07-30 DIAGNOSIS — Z6827 Body mass index (BMI) 27.0-27.9, adult: Secondary | ICD-10-CM | POA: Diagnosis not present

## 2018-07-30 DIAGNOSIS — I4891 Unspecified atrial fibrillation: Secondary | ICD-10-CM

## 2018-07-30 DIAGNOSIS — Z5181 Encounter for therapeutic drug level monitoring: Secondary | ICD-10-CM

## 2018-07-30 DIAGNOSIS — I779 Disorder of arteries and arterioles, unspecified: Secondary | ICD-10-CM | POA: Diagnosis not present

## 2018-07-30 DIAGNOSIS — Z1389 Encounter for screening for other disorder: Secondary | ICD-10-CM | POA: Diagnosis not present

## 2018-07-30 DIAGNOSIS — Z0001 Encounter for general adult medical examination with abnormal findings: Secondary | ICD-10-CM | POA: Diagnosis not present

## 2018-07-30 DIAGNOSIS — N529 Male erectile dysfunction, unspecified: Secondary | ICD-10-CM | POA: Diagnosis not present

## 2018-07-30 DIAGNOSIS — R5383 Other fatigue: Secondary | ICD-10-CM | POA: Diagnosis not present

## 2018-07-30 DIAGNOSIS — R252 Cramp and spasm: Secondary | ICD-10-CM | POA: Diagnosis not present

## 2018-07-30 DIAGNOSIS — N201 Calculus of ureter: Secondary | ICD-10-CM | POA: Diagnosis not present

## 2018-07-30 DIAGNOSIS — J029 Acute pharyngitis, unspecified: Secondary | ICD-10-CM | POA: Diagnosis not present

## 2018-07-30 DIAGNOSIS — Z23 Encounter for immunization: Secondary | ICD-10-CM | POA: Diagnosis not present

## 2018-07-30 DIAGNOSIS — K2270 Barrett's esophagus without dysplasia: Secondary | ICD-10-CM | POA: Diagnosis not present

## 2018-07-30 LAB — POCT INR: INR: 4 — AB (ref 2.0–3.0)

## 2018-07-30 NOTE — Patient Instructions (Signed)
Hold coumadin tonight then decrease dose to 1/2 tablet daily except 1 tablet on Mondays, Wednesdays and Fridays Call us with any new medications, changes or any bleeding concerns @ [phone].  Recheck in 1 week

## 2018-07-30 NOTE — Telephone Encounter (Signed)
Please give pt a call @ [phone]

## 2018-07-30 NOTE — Telephone Encounter (Signed)
Pt has had a bad sore throat x 4 days.  Has an appt at Dr Delanna Ahmadi office today at 1:15pm.  Wants to know what they can give him that won't make his INR worse.  It was elevated last week due to prednisone for gout.  Told pt it would be best if they checked his coumadin before prescribing new med.  He will discuss with them and let me know.

## 2018-08-05 DIAGNOSIS — Z125 Encounter for screening for malignant neoplasm of prostate: Secondary | ICD-10-CM | POA: Diagnosis not present

## 2018-08-05 DIAGNOSIS — Z23 Encounter for immunization: Secondary | ICD-10-CM | POA: Diagnosis not present

## 2018-08-05 DIAGNOSIS — R7309 Other abnormal glucose: Secondary | ICD-10-CM | POA: Diagnosis not present

## 2018-08-05 DIAGNOSIS — E785 Hyperlipidemia, unspecified: Secondary | ICD-10-CM | POA: Diagnosis not present

## 2018-08-05 DIAGNOSIS — R946 Abnormal results of thyroid function studies: Secondary | ICD-10-CM | POA: Diagnosis not present

## 2018-08-05 DIAGNOSIS — Z1389 Encounter for screening for other disorder: Secondary | ICD-10-CM | POA: Diagnosis not present

## 2018-08-05 DIAGNOSIS — R739 Hyperglycemia, unspecified: Secondary | ICD-10-CM | POA: Diagnosis not present

## 2018-08-05 DIAGNOSIS — Z6827 Body mass index (BMI) 27.0-27.9, adult: Secondary | ICD-10-CM | POA: Diagnosis not present

## 2018-08-05 DIAGNOSIS — Z0001 Encounter for general adult medical examination with abnormal findings: Secondary | ICD-10-CM | POA: Diagnosis not present

## 2018-08-06 ENCOUNTER — Ambulatory Visit (INDEPENDENT_AMBULATORY_CARE_PROVIDER_SITE_OTHER): Payer: Medicare Other | Admitting: *Deleted

## 2018-08-06 DIAGNOSIS — Z5181 Encounter for therapeutic drug level monitoring: Secondary | ICD-10-CM | POA: Diagnosis not present

## 2018-08-06 DIAGNOSIS — I4891 Unspecified atrial fibrillation: Secondary | ICD-10-CM | POA: Diagnosis not present

## 2018-08-06 LAB — POCT INR: INR: 3.5 — AB (ref 2.0–3.0)

## 2018-08-06 NOTE — Patient Instructions (Signed)
Hold coumadin tonight then resume 1/2 tablet daily except 1 tablet on Mondays, Wednesdays and Fridays Call us with any new medications, changes or any bleeding concerns @ [phone].  Recheck in 1 week

## 2018-08-15 ENCOUNTER — Ambulatory Visit (INDEPENDENT_AMBULATORY_CARE_PROVIDER_SITE_OTHER): Payer: Medicare Other | Admitting: *Deleted

## 2018-08-15 DIAGNOSIS — Z5181 Encounter for therapeutic drug level monitoring: Secondary | ICD-10-CM | POA: Diagnosis not present

## 2018-08-15 DIAGNOSIS — I4891 Unspecified atrial fibrillation: Secondary | ICD-10-CM

## 2018-08-15 LAB — POCT INR: INR: 2 (ref 2.0–3.0)

## 2018-08-15 NOTE — Patient Instructions (Signed)
Continue coumadin 1/2 tablet daily except 1 tablet on Mondays, Wednesdays and Fridays Call us with any new medications, changes or any bleeding concerns @ [phone].  Recheck in 2 weeks

## 2018-08-20 ENCOUNTER — Telehealth: Payer: Self-pay | Admitting: *Deleted

## 2018-08-20 DIAGNOSIS — G894 Chronic pain syndrome: Secondary | ICD-10-CM | POA: Diagnosis not present

## 2018-08-20 DIAGNOSIS — G2581 Restless legs syndrome: Secondary | ICD-10-CM | POA: Diagnosis not present

## 2018-08-20 DIAGNOSIS — M545 Low back pain: Secondary | ICD-10-CM | POA: Diagnosis not present

## 2018-08-20 DIAGNOSIS — E1142 Type 2 diabetes mellitus with diabetic polyneuropathy: Secondary | ICD-10-CM | POA: Diagnosis not present

## 2018-08-20 NOTE — Telephone Encounter (Signed)
Told pt there is no interaction between coumadin and gabapentin.  He verbalized understanding.

## 2018-08-20 NOTE — Telephone Encounter (Signed)
Patient was started on Gabapentin and wants to know if that will affect his coumadin. / tg

## 2018-08-27 ENCOUNTER — Ambulatory Visit (INDEPENDENT_AMBULATORY_CARE_PROVIDER_SITE_OTHER): Payer: Medicare Other | Admitting: *Deleted

## 2018-08-27 DIAGNOSIS — Z5181 Encounter for therapeutic drug level monitoring: Secondary | ICD-10-CM

## 2018-08-27 DIAGNOSIS — I4891 Unspecified atrial fibrillation: Secondary | ICD-10-CM | POA: Diagnosis not present

## 2018-08-27 LAB — POCT INR: INR: 1.5 — AB (ref 2.0–3.0)

## 2018-08-27 NOTE — Patient Instructions (Signed)
Increase coumadin to 1 tablet daily except 1/2 tablet on Tuesdays and Saturdays Call us with any new medications, changes or any bleeding concerns @ [phone].  Recheck in 1 week

## 2018-09-01 ENCOUNTER — Other Ambulatory Visit: Payer: Self-pay | Admitting: Gastroenterology

## 2018-09-03 ENCOUNTER — Encounter: Payer: Self-pay | Admitting: Student

## 2018-09-03 ENCOUNTER — Ambulatory Visit (INDEPENDENT_AMBULATORY_CARE_PROVIDER_SITE_OTHER): Payer: Medicare Other | Admitting: *Deleted

## 2018-09-03 ENCOUNTER — Encounter

## 2018-09-03 ENCOUNTER — Ambulatory Visit (INDEPENDENT_AMBULATORY_CARE_PROVIDER_SITE_OTHER): Payer: Medicare Other | Admitting: Student

## 2018-09-03 VITALS — BP 110/60 | HR 62 | Ht 71.0 in | Wt 205.0 lb

## 2018-09-03 DIAGNOSIS — E1140 Type 2 diabetes mellitus with diabetic neuropathy, unspecified: Secondary | ICD-10-CM | POA: Diagnosis not present

## 2018-09-03 DIAGNOSIS — I4891 Unspecified atrial fibrillation: Secondary | ICD-10-CM | POA: Diagnosis not present

## 2018-09-03 DIAGNOSIS — R0789 Other chest pain: Secondary | ICD-10-CM | POA: Diagnosis not present

## 2018-09-03 DIAGNOSIS — I480 Paroxysmal atrial fibrillation: Secondary | ICD-10-CM | POA: Diagnosis not present

## 2018-09-03 DIAGNOSIS — Z5181 Encounter for therapeutic drug level monitoring: Secondary | ICD-10-CM | POA: Diagnosis not present

## 2018-09-03 DIAGNOSIS — Z7901 Long term (current) use of anticoagulants: Secondary | ICD-10-CM | POA: Diagnosis not present

## 2018-09-03 DIAGNOSIS — I10 Essential (primary) hypertension: Secondary | ICD-10-CM

## 2018-09-03 LAB — POCT INR: INR: 1.7 — AB (ref 2.0–3.0)

## 2018-09-03 MED ORDER — APIXABAN 5 MG PO TABS: 5.0000 mg | ORAL_TABLET | Freq: Two times a day (BID) | ORAL | 0 refills | Status: DC

## 2018-09-03 MED ORDER — METOPROLOL SUCCINATE ER 25 MG PO TB24: 25.0000 mg | ORAL_TABLET | Freq: Every day | ORAL | 11 refills | Status: DC

## 2018-09-03 NOTE — Patient Instructions (Signed)
Increase coumadin to 1 tablet daily  Call us with any new medications, changes or any bleeding concerns @ [phone].  Recheck in 10 days

## 2018-09-03 NOTE — Patient Instructions (Signed)
Medication Instructions:  Your physician has recommended you make the following change in your medication:  Stop Taking Lopressor  Start Taking Toprol XL 25 mg Daily   If you need a refill on your cardiac medications before your next appointment, please call your pharmacy.   Lab work: NONE  If you have labs (blood work) drawn today and your tests are completely normal, you will receive your results only by: [name] MyChart Message (if you have MyChart) OR . A paper copy in the mail If you have any lab test that is abnormal or we need to change your treatment, we will call you to review the results.  Testing/Procedures: NONE   Follow-Up: At [facility], you and your health needs are our priority.  As part of our continuing mission to provide you with exceptional heart care, we have created designated Provider Care Teams.  These Care Teams include your primary Cardiologist (physician) and Advanced Practice Providers (APPs -  Physician Assistants and Nurse Practitioners) who all work together to provide you with the care you need, when you need it. You will need a follow up appointment in 3 months.  Please call our office 2 months in advance to schedule this appointment.  You may see Kate Sable, MD or one of the following Advanced Practice Providers on your designated Care Team:   Bernerd Pho, PA-C [facility] : [facility]) . Ermalinda Barrios, PA-C ([place])  Any Other Special Instructions Will Be Listed Below (If Applicable). Thank you for choosing [facility]!

## 2018-09-03 NOTE — Progress Notes (Signed)
Cardiology Office Note    Date:  09/03/2018   ID:  [name], DOB 12/10/1940, MRN [id number]  PCP:  [facility]  Cardiologist: Kate Sable, MD    Chief Complaint  Patient presents with  . Follow-up    2 month visit    History of Present Illness:    [name] is a 77 y.o. male with past medical history of paroxysmal atrial fibrillation (on Coumadin for anticoagulation), HTN, and HLD who presents to the office today for 24-month follow-up.  He was last examined by Dr. Bronson Ing in 06/2018 and reported occasional palpitations but denied any chest pain or dizziness. He reported having marked fatigue over the past few months. He was bradycardic at the time of his visit and Lopressor was reduced to 12.5 mg twice daily and he was also transitioned to Coumadin as Xarelto was not affordable. With his exertional fatigue and dyspnea occurring while still in sinus rhythm, an Exercise Myoview was obtained to assess for an ischemic etiology. This was performed on 07/16/2018 and showed no significant ischemia or scar and was overall a low risk study.  In talking with the patient today, he reports multiple somatic complaints that have started occurring over the past few weeks. He was recently diagnosed with Type 2 DM by his PCP and started on Glipizide along with Gabapentin for neuropathy. Since this, he has noticed worsening dizziness and occasional night sweats. He does feel like his fatigue improved somewhat with the reduction of Lopressor dosing but still has fatigue at baseline. He reports feeling more "winded" when performing routine chores around his home. He also notes increased stress as he is the primary caretaker for his wife.  He denies any episodes of exertional chest discomfort but does experience "indigestion" that is relieved with the use of antacids. This typically occurs after he consumes a nighttime snack and then lays down to go to sleep.   Past  Medical History:  Diagnosis Date  . Barrett's esophagus    short-segment, diagnosed in 2007 by Dr. [name]  . BPH (benign prostatic hyperplasia)   . GERD (gastroesophageal reflux disease)   . Hiatal hernia    small  . HTN (hypertension)   . PAF (paroxysmal atrial fibrillation) ([place])   . S/P colonoscopy 2007   unremarkable  . S/P endoscopy Oct 2011    Salmon-colored epithelium coming up to 37 cm from the   . Vertigo     Past Surgical History:  Procedure Laterality Date  . APPENDECTOMY    . CHOLECYSTECTOMY    . COLONOSCOPY  02/06/2006   Dr. [name]- single anal papilla, o/w normal rectum, normal colon  . COLONOSCOPY N/A 09/13/2016   Procedure: COLONOSCOPY;  Surgeon: [name], MD;  [facility];  Service: Endoscopy;  Laterality: N/A;  145   . ESOPHAGOGASTRODUODENOSCOPY  09/15/2010   Dr. [name]- Salmon-colored epithelium coming up to 37 cm from the   . ESOPHAGOGASTRODUODENOSCOPY N/A 07/02/2013   Dr. [name]- Barretts on bx. hiatal hernia  . ESOPHAGOGASTRODUODENOSCOPY N/A 09/13/2016   Procedure: ESOPHAGOGASTRODUODENOSCOPY (EGD);  Surgeon: [name], MD;  Location: [facility];  Service: Endoscopy;  Laterality: N/A;  . ROTATOR CUFF REPAIR    . YAG LASER APPLICATION Right 03/24/3148   Procedure: YAG LASER APPLICATION;  Surgeon: [name], MD;  Location: [facility];  Service: Ophthalmology;  Laterality: Right;    Current Medications: Outpatient Medications Prior to Visit  Medication Sig Dispense Refill  .  ALPRAZolam (XANAX) 1 MG tablet Take 0.5 mg by mouth at bedtime as needed for sleep.    . fexofenadine (ALLEGRA) 180 MG tablet Take 180 mg by mouth every morning.    . gabapentin (NEURONTIN) 300 MG capsule Take 300 mg by mouth at bedtime.    [name] glipiZIDE (GLUCOTROL) 5 MG tablet   4  . pantoprazole (PROTONIX) 40 MG tablet TAKE ONE TABLET BY MOUTH TWICE A DAY 180 tablet 3  . Tamsulosin HCl (FLOMAX) 0.4 MG CAPS Take 0.4 mg by mouth 2 (two) times daily.     [name] warfarin  (COUMADIN) 5 MG tablet Take 1 tablet (5 mg total) by mouth daily at 6 PM. 45 tablet 1   No facility-administered medications prior to visit.      Allergies:   Patient has no known allergies.   Social History   Socioeconomic History  . Marital status: Married    Spouse name: Not on file  . Number of children: Not on file  . Years of education: Not on file  . Highest education level: Not on file  Occupational History  . Not on file  Social Needs  . Financial resource strain: Not on file  . Food insecurity:    Worry: Not on file    Inability: Not on file  . Transportation needs:    Medical: Not on file    Non-medical: Not on file  Tobacco Use  . Smoking status: Former Smoker    Packs/day: 1.50    Years: 30.00    Pack years: 45.00    Types: Cigarettes    Last attempt to quit: 11/18/1993    Years since quitting: 24.8  . Smokeless tobacco: Never Used  . Tobacco comment: quit in the 90's  Substance and Sexual Activity  . Alcohol use: No  . Drug use: No  . Sexual activity: Not on file  Lifestyle  . Physical activity:    Days per week: Not on file    Minutes per session: Not on file  . Stress: Not on file  Relationships  . Social connections:    Talks on phone: Not on file    Gets together: Not on file    Attends religious service: Not on file    Active member of club or organization: Not on file    Attends meetings of clubs or organizations: Not on file    Relationship status: Not on file  Other Topics Concern  . Not on file  Social History Narrative  . Not on file     Family History:  The patient's family history includes Prostate cancer in his father; Stroke in his mother.   Review of Systems:   Please see the history of present illness.     General:  No chills, fever, or weight changes. Positive for night sweats.  Cardiovascular:  No chest pain, dyspnea on exertion, edema, orthopnea, palpitations, paroxysmal nocturnal dyspnea. Dermatological: No rash,  lesions/masses Respiratory: No cough, dyspnea Urologic: No hematuria, dysuria Abdominal:   No nausea, vomiting, diarrhea, bright red blood per rectum, melena, or hematemesis Neurologic:  No visual changes, wkns, changes in mental status. Positive for dizziness.   All other systems reviewed and are otherwise negative except as noted above.   Physical Exam:    VS:  BP 110/60   Pulse 62   Ht 5\' 11"  (1.803 m)   Wt 205 lb (93 kg)   SpO2 95%   BMI 28.59 kg/m    General: Well developed,  well nourished Caucasian male appearing in no acute distress. Head: Normocephalic, atraumatic, sclera non-icteric, no xanthomas, nares are without discharge.  Neck: No carotid bruits. JVD not elevated.  Lungs: Respirations regular and unlabored, without wheezes or rales.  Heart: Regular rate and rhythm. No S3 or S4.  No murmur, no rubs, or gallops appreciated. Abdomen: Soft, non-tender, non-distended with normoactive bowel sounds. No hepatomegaly. No rebound/guarding. No obvious abdominal masses. Msk:  Strength and tone appear normal for age. No joint deformities or effusions. Extremities: No clubbing or cyanosis. No lower extremity edema.  Distal pedal pulses are 2+ bilaterally. Neuro: Alert and oriented X 3. Moves all extremities spontaneously. No focal deficits noted. Psych:  Responds to questions appropriately with a normal affect. Skin: No rashes or lesions noted  Wt Readings from Last 3 Encounters:  09/03/18 205 lb (93 kg)  07/09/18 211 lb (95.7 kg)  05/08/18 213 lb (96.6 kg)     Studies/Labs Reviewed:   EKG:  EKG is not ordered today.  Recent Labs: 05/02/2018: B Natriuretic Peptide 179.0; BUN 14; Creatinine, Ser 1.20; Hemoglobin 15.0; Platelets 174; Potassium 3.9; Sodium 143; TSH 3.235   Lipid Panel No results found for: CHOL, TRIG, HDL, CHOLHDL, VLDL, LDLCALC, LDLDIRECT  Additional studies/ records that were reviewed today include:   NST: 06/2018  No diagnostic ST segment changes to  indicate ischemia. No chest pain was reported. Occasional PACs and PVCs noted without sustained arrhythmia. Low risk Duke treadmill score of 5.5.  No significant myocardial perfusion defects to indicate scar or ischemia.  This is a low risk study.  Nuclear stress EF: 64%.  Echocardiogram: 04/2018  Study Conclusions  - Left ventricle: The cavity size was normal. Wall thickness was   normal. Systolic function was normal. The estimated ejection   fraction was in the range of 55% to 60%. Wall motion was normal;   there were no regional wall motion abnormalities. The study was   not technically sufficient to allow evaluation of LV diastolic   dysfunction due to atrial fibrillation. - Aortic valve: Valve area (VTI): 2.62 cm^2. Valve area (Vmax):   2.77 cm^2. Valve area (Vmean): 2.33 cm^2. - Technically adequate study.   Assessment:    1. PAF (paroxysmal atrial fibrillation) ([place])   2. Current use of long term anticoagulation   3. Essential hypertension   4. Atypical chest pain   5. Type 2 diabetes mellitus with diabetic neuropathy, without long-term current use of insulin ([place])      Plan:   In order of problems listed above:  1. Paroxysmal Atrial Fibrillation/ Use of Long-Term Anticoagulation - He denies any recurrent palpitations over the past several weeks but does report fatigue and dizziness which is difficult to tell if this is due to Lopressor dosing or the recent addition of medications for newly diagnosed Type II DM. No reported episodes of bradycardia when checked at home and HR is in the 60's today. He is currently on Lopressor 12.5 mg twice daily. Will plan to transition this to Toprol-XL 25 mg daily for sustained release. Reviewed with the patient that if his symptoms persist, he could try taking this at 12.5 mg daily for several weeks. - Denies any evidence of active bleeding. He does not like having to undergo frequent INR checks and we reviewed that this should  stabilize over the next few months. He does wish to be on a DOAC but insurance coverage was not available for Xarelto and he did not qualify for patient assistance. Provided  patient with a printed Rx of Eliquis so he could check his co-pay for this. If affordable, he could be switched to Eliquis 5 mg twice daily but I informed him to make [place] aware of any transitions as he remains on Coumadin at this time.  2. HTN - BP is well controlled at 110/60 during today's visit. - Will plan to transition Lopressor to Toprol-XL as outlined above.  3. Atypical Chest Pain - He reports having "indigestion" at night which typically occurs after consuming a nighttime snack and then lying down to go to sleep. Symptoms improve with Antacid use and he remains on Protonix. Recent Myoview in 06/2018 showed no significant ischemia or scar and was overall a low risk study. Given his atypical symptoms and recent NST, would not plan for further ischemic evaluation at this time.   4. Type 2 DM - This is a new diagnosis for the patient as he was recently started on Glipizide along with Gabapentin for neuropathy. He reports having worsening dizziness and occasional night sweats and I encouraged him to check his blood sugar when this occurs due to concerns for hypoglycemia. Encouraged him to review this with his PCP, as this could be a side-effect to Gabapentin and symptoms started when this was initiated.    Medication Adjustments/Labs and Tests Ordered: Current medicines are reviewed at length with the patient today.  Concerns regarding medicines are outlined above.  Medication changes, Labs and Tests ordered today are listed in the Patient Instructions below. Patient Instructions  Medication Instructions:  Your physician has recommended you make the following change in your medication:  Stop Taking Lopressor  Start Taking Toprol XL 25 mg Daily   If you need a refill on your cardiac medications before your next  appointment, please call your pharmacy.   Lab work: NONE  If you have labs (blood work) drawn today and your tests are completely normal, you will receive your results only by: [name] MyChart Message (if you have MyChart) OR . A paper copy in the mail If you have any lab test that is abnormal or we need to change your treatment, we will call you to review the results.  Testing/Procedures: NONE   Follow-Up: At [facility], you and your health needs are our priority.  As part of our continuing mission to provide you with exceptional heart care, we have created designated Provider Care Teams.  These Care Teams include your primary Cardiologist (physician) and Advanced Practice Providers (APPs -  Physician Assistants and Nurse Practitioners) who all work together to provide you with the care you need, when you need it. You will need a follow up appointment in 3 months.  Please call our office 2 months in advance to schedule this appointment.  You may see Kate Sable, MD or one of the following Advanced Practice Providers on your designated Care Team:   Bernerd Pho, PA-C [facility]) . Ermalinda Barrios, PA-C ([place])  Any Other Special Instructions Will Be Listed Below (If Applicable). Thank you for choosing [facility]!      Signed, Erma Heritage, PA-C  09/03/2018 4:50 PM    [facility] [address] [place], [place] [address] Phone: [phone]

## 2018-09-15 ENCOUNTER — Ambulatory Visit (INDEPENDENT_AMBULATORY_CARE_PROVIDER_SITE_OTHER): Payer: Medicare Other | Admitting: *Deleted

## 2018-09-15 DIAGNOSIS — Z5181 Encounter for therapeutic drug level monitoring: Secondary | ICD-10-CM | POA: Diagnosis not present

## 2018-09-15 DIAGNOSIS — I4891 Unspecified atrial fibrillation: Secondary | ICD-10-CM | POA: Diagnosis not present

## 2018-09-15 LAB — POCT INR: INR: 2 (ref 2.0–3.0)

## 2018-09-15 NOTE — Patient Instructions (Signed)
Continue coumadin 1 tablet daily Call us with any new medications, changes or any bleeding concerns @ [phone].  Recheck in 2 weeks

## 2018-09-19 DIAGNOSIS — Z1389 Encounter for screening for other disorder: Secondary | ICD-10-CM | POA: Diagnosis not present

## 2018-09-19 DIAGNOSIS — M10071 Idiopathic gout, right ankle and foot: Secondary | ICD-10-CM | POA: Diagnosis not present

## 2018-09-19 DIAGNOSIS — Z6824 Body mass index (BMI) 24.0-24.9, adult: Secondary | ICD-10-CM | POA: Diagnosis not present

## 2018-09-19 DIAGNOSIS — E1165 Type 2 diabetes mellitus with hyperglycemia: Secondary | ICD-10-CM | POA: Diagnosis not present

## 2018-10-01 ENCOUNTER — Ambulatory Visit (INDEPENDENT_AMBULATORY_CARE_PROVIDER_SITE_OTHER): Payer: Medicare Other | Admitting: *Deleted

## 2018-10-01 DIAGNOSIS — I4891 Unspecified atrial fibrillation: Secondary | ICD-10-CM

## 2018-10-01 DIAGNOSIS — E1142 Type 2 diabetes mellitus with diabetic polyneuropathy: Secondary | ICD-10-CM | POA: Diagnosis not present

## 2018-10-01 DIAGNOSIS — Z5181 Encounter for therapeutic drug level monitoring: Secondary | ICD-10-CM

## 2018-10-01 DIAGNOSIS — M545 Low back pain: Secondary | ICD-10-CM | POA: Diagnosis not present

## 2018-10-01 DIAGNOSIS — G894 Chronic pain syndrome: Secondary | ICD-10-CM | POA: Diagnosis not present

## 2018-10-01 DIAGNOSIS — G2581 Restless legs syndrome: Secondary | ICD-10-CM | POA: Diagnosis not present

## 2018-10-01 LAB — POCT INR: INR: 3.3 — AB (ref 2.0–3.0)

## 2018-10-01 MED ORDER — WARFARIN SODIUM 5 MG PO TABS: ORAL_TABLET | ORAL | 4 refills | Status: DC

## 2018-10-01 NOTE — Patient Instructions (Signed)
Hold coumadin tonight then decrease dose to 1 tablet daily except 1/2 tablet on Tuesdays and Fridays Starting on Allopurinol daily for gout.  Can increase INR Call us with any new medications, changes or any bleeding concerns @ [phone].  Recheck in 10 days

## 2018-10-13 ENCOUNTER — Ambulatory Visit (INDEPENDENT_AMBULATORY_CARE_PROVIDER_SITE_OTHER): Payer: Medicare Other | Admitting: *Deleted

## 2018-10-13 DIAGNOSIS — Z5181 Encounter for therapeutic drug level monitoring: Secondary | ICD-10-CM

## 2018-10-13 DIAGNOSIS — I4891 Unspecified atrial fibrillation: Secondary | ICD-10-CM | POA: Diagnosis not present

## 2018-10-13 LAB — POCT INR: INR: 2.4 (ref 2.0–3.0)

## 2018-10-13 NOTE — Patient Instructions (Signed)
Continue coumadin 1 tablet daily except 1/2 tablet on Tuesdays and Fridays On Allopurinol daily for gout.  Call us with any new medications, changes or any bleeding concerns @ [phone].  Recheck in 3 weeks

## 2018-10-23 DIAGNOSIS — R972 Elevated prostate specific antigen [PSA]: Secondary | ICD-10-CM | POA: Diagnosis not present

## 2018-10-31 ENCOUNTER — Ambulatory Visit (INDEPENDENT_AMBULATORY_CARE_PROVIDER_SITE_OTHER): Payer: Medicare Other | Admitting: Urology

## 2018-10-31 DIAGNOSIS — N401 Enlarged prostate with lower urinary tract symptoms: Secondary | ICD-10-CM | POA: Diagnosis not present

## 2018-10-31 DIAGNOSIS — R972 Elevated prostate specific antigen [PSA]: Secondary | ICD-10-CM | POA: Diagnosis not present

## 2018-11-03 ENCOUNTER — Ambulatory Visit (INDEPENDENT_AMBULATORY_CARE_PROVIDER_SITE_OTHER): Payer: Medicare Other | Admitting: *Deleted

## 2018-11-03 DIAGNOSIS — I4891 Unspecified atrial fibrillation: Secondary | ICD-10-CM | POA: Diagnosis not present

## 2018-11-03 DIAGNOSIS — Z5181 Encounter for therapeutic drug level monitoring: Secondary | ICD-10-CM

## 2018-11-03 LAB — POCT INR: INR: 2 (ref 2.0–3.0)

## 2018-11-03 NOTE — Patient Instructions (Signed)
Take coumadin 1 1/2 tablets tonight then resume 1 tablet daily except 1/2 tablet on Tuesdays and Fridays On Allopurinol daily for gout.  Call us with any new medications, changes or any bleeding concerns @ [phone].  Recheck in 4 weeks

## 2018-11-21 DIAGNOSIS — E663 Overweight: Secondary | ICD-10-CM | POA: Diagnosis not present

## 2018-11-21 DIAGNOSIS — Z6825 Body mass index (BMI) 25.0-25.9, adult: Secondary | ICD-10-CM | POA: Diagnosis not present

## 2018-11-21 DIAGNOSIS — F419 Anxiety disorder, unspecified: Secondary | ICD-10-CM | POA: Diagnosis not present

## 2018-11-21 DIAGNOSIS — M109 Gout, unspecified: Secondary | ICD-10-CM | POA: Diagnosis not present

## 2018-11-21 DIAGNOSIS — N400 Enlarged prostate without lower urinary tract symptoms: Secondary | ICD-10-CM | POA: Diagnosis not present

## 2018-12-01 ENCOUNTER — Ambulatory Visit (INDEPENDENT_AMBULATORY_CARE_PROVIDER_SITE_OTHER): Payer: Medicare Other | Admitting: Pharmacist

## 2018-12-01 DIAGNOSIS — Z5181 Encounter for therapeutic drug level monitoring: Secondary | ICD-10-CM | POA: Diagnosis not present

## 2018-12-01 DIAGNOSIS — I4891 Unspecified atrial fibrillation: Secondary | ICD-10-CM

## 2018-12-01 LAB — POCT INR: INR: 2.3 (ref 2.0–3.0)

## 2018-12-01 NOTE — Patient Instructions (Signed)
Description   Continue 1 tablet daily except 1/2 tablet on Tuesdays and Fridays On Allopurinol daily for gout.  Call us with any new medications, changes or any bleeding concerns @ [phone].  Recheck in 4 weeks

## 2018-12-04 ENCOUNTER — Ambulatory Visit (INDEPENDENT_AMBULATORY_CARE_PROVIDER_SITE_OTHER): Payer: Medicare Other | Admitting: Cardiovascular Disease

## 2018-12-04 ENCOUNTER — Encounter: Payer: Self-pay | Admitting: Cardiovascular Disease

## 2018-12-04 VITALS — BP 122/68 | HR 51 | Ht 71.0 in | Wt 212.0 lb

## 2018-12-04 DIAGNOSIS — I480 Paroxysmal atrial fibrillation: Secondary | ICD-10-CM | POA: Diagnosis not present

## 2018-12-04 DIAGNOSIS — F329 Major depressive disorder, single episode, unspecified: Secondary | ICD-10-CM

## 2018-12-04 DIAGNOSIS — F32A Depression, unspecified: Secondary | ICD-10-CM

## 2018-12-04 DIAGNOSIS — E1140 Type 2 diabetes mellitus with diabetic neuropathy, unspecified: Secondary | ICD-10-CM

## 2018-12-04 DIAGNOSIS — I10 Essential (primary) hypertension: Secondary | ICD-10-CM | POA: Diagnosis not present

## 2018-12-04 MED ORDER — METOPROLOL SUCCINATE ER 25 MG PO TB24: 12.5000 mg | ORAL_TABLET | Freq: Every day | ORAL | 11 refills | Status: DC

## 2018-12-04 NOTE — Progress Notes (Signed)
SUBJECTIVE: The patient presents for follow-up of paroxysmal atrial fibrillation and flutter.  He is anticoagulated with warfarin. He underwent a low risk nuclear stress test on 07/16/2018.  He has been on prednisone for gout.  He has had episodes of nervousness and shaking.  He checks his blood sugars every morning and they run between 130-180.  He has been dealing with anxiety and depression and was recently started on Lexapro 10 mg daily by his PCP which has helped.  He had been a Higher education careers adviser for 30 years.  He talked to me extensively about his wife who has been in failing health over the last few years ever since she got her pacemaker.     Review of Systems: As per "subjective", otherwise negative.  No Known Allergies  Current Outpatient Medications  Medication Sig Dispense Refill  . ALPRAZolam (XANAX) 1 MG tablet Take 0.5 mg by mouth at bedtime as needed for anxiety.    [name] escitalopram (LEXAPRO) 10 MG tablet Take 10 mg by mouth daily.    . fexofenadine (ALLEGRA) 180 MG tablet Take 180 mg by mouth every morning.    . gabapentin (NEURONTIN) 300 MG capsule Take 300 mg by mouth at bedtime.    [name] glipiZIDE (GLUCOTROL) 5 MG tablet   4  . metoprolol succinate (TOPROL XL) 25 MG 24 hr tablet Take 1 tablet (25 mg total) by mouth daily. 30 tablet 11  . pantoprazole (PROTONIX) 40 MG tablet TAKE ONE TABLET BY MOUTH TWICE A DAY 180 tablet 3  . PREDNISONE PO Take by mouth.    . Tamsulosin HCl (FLOMAX) 0.4 MG CAPS Take 0.4 mg by mouth 2 (two) times daily.     [name] warfarin (COUMADIN) 5 MG tablet Take 1 tablet daily except 1/2 tablet on Tuesdays and Fridays or as directed 45 tablet 4   No current facility-administered medications for this visit.     Past Medical History:  Diagnosis Date  . Barrett's esophagus    short-segment, diagnosed in 2007 by Dr. [name]  . BPH (benign prostatic hyperplasia)   . GERD (gastroesophageal reflux disease)   . Hiatal hernia    small  . HTN (hypertension)    . PAF (paroxysmal atrial fibrillation) ([place])   . S/P colonoscopy 2007   unremarkable  . S/P endoscopy Oct 2011    Salmon-colored epithelium coming up to 37 cm from the   . Vertigo     Past Surgical History:  Procedure Laterality Date  . APPENDECTOMY    . CHOLECYSTECTOMY    . COLONOSCOPY  02/06/2006   Dr. [name]- single anal papilla, o/w normal rectum, normal colon  . COLONOSCOPY N/A 09/13/2016   Procedure: COLONOSCOPY;  Surgeon: [name], MD;  [facility];  Service: Endoscopy;  Laterality: N/A;  145   . ESOPHAGOGASTRODUODENOSCOPY  09/15/2010   Dr. [name]- Salmon-colored epithelium coming up to 37 cm from the   . ESOPHAGOGASTRODUODENOSCOPY N/A 07/02/2013   Dr. [name]- Barretts on bx. hiatal hernia  . ESOPHAGOGASTRODUODENOSCOPY N/A 09/13/2016   Procedure: ESOPHAGOGASTRODUODENOSCOPY (EGD);  Surgeon: [name], MD;  [facility];  Service: Endoscopy;  Laterality: N/A;  . ROTATOR CUFF REPAIR    . YAG LASER APPLICATION Right 07/22/5700   Procedure: YAG LASER APPLICATION;  Surgeon: [name], MD;  Location: [facility];  Service: Ophthalmology;  Laterality: Right;    Social History   Socioeconomic History  . Marital status: Married    Spouse name: Not on  file  . Number of children: Not on file  . Years of education: Not on file  . Highest education level: Not on file  Occupational History  . Not on file  Social Needs  . Financial resource strain: Not on file  . Food insecurity:    Worry: Not on file    Inability: Not on file  . Transportation needs:    Medical: Not on file    Non-medical: Not on file  Tobacco Use  . Smoking status: Former Smoker    Packs/day: 1.50    Years: 30.00    Pack years: 45.00    Types: Cigarettes    Last attempt to quit: 11/18/1993    Years since quitting: 25.0  . Smokeless tobacco: Never Used  . Tobacco comment: quit in the 90's  Substance and Sexual Activity  . Alcohol use: No  . Drug use: No  . Sexual  activity: Not on file  Lifestyle  . Physical activity:    Days per week: Not on file    Minutes per session: Not on file  . Stress: Not on file  Relationships  . Social connections:    Talks on phone: Not on file    Gets together: Not on file    Attends religious service: Not on file    Active member of club or organization: Not on file    Attends meetings of clubs or organizations: Not on file    Relationship status: Not on file  . Intimate partner violence:    Fear of current or ex partner: Not on file    Emotionally abused: Not on file    Physically abused: Not on file    Forced sexual activity: Not on file  Other Topics Concern  . Not on file  Social History Narrative  . Not on file     Vitals:   12/04/18 1057  BP: 122/68  Pulse: (!) 51  SpO2: 96%  Weight: 212 lb (96.2 kg)  Height: 5\' 11"  (1.803 m)    Wt Readings from Last 3 Encounters:  12/04/18 212 lb (96.2 kg)  09/03/18 205 lb (93 kg)  07/09/18 211 lb (95.7 kg)     PHYSICAL EXAM General: NAD HEENT: Normal. Neck: No JVD, no thyromegaly. Lungs: Clear to auscultation bilaterally with normal respiratory effort. CV: Bradycardic, regular rhythm, normal S1/S2, no S3/S4, no murmur. No pretibial or periankle edema.    Abdomen: Soft, nontender, no distention.  Neurologic: Alert and oriented.  Psych: Normal affect. Skin: Normal. Musculoskeletal: No gross deformities.    ECG: Reviewed above under Subjective   Labs: Lab Results  Component Value Date/Time   K 3.9 05/02/2018 03:41 PM   BUN 14 05/02/2018 03:41 PM   CREATININE 1.20 05/02/2018 03:41 PM   ALT 18 05/14/2014 03:18 PM   TSH 3.235 05/02/2018 03:41 PM   HGB 15.0 05/02/2018 03:41 PM     Lipids: No results found for: LDLCALC, LDLDIRECT, CHOL, TRIG, HDL     ASSESSMENT AND PLAN: 1.  Paroxysmal atrial fibrillation: Currently on Toprol-XL 25 mg daily.  He is bradycardic.  I will reduce to 12.5 mg daily.  Anticoagulated with warfarin.  INR 2.3 on  12/01/2018.  2.  Hypertension: Blood pressure is normal.  3.  Type 2 diabetes mellitus: Diagnosed in the fall 2019.  Currently on glipizide.  4.  Depression: Currently on Lexapro 10 mg.  This appears to be situational and related to his wife's failing health.    Disposition: Follow  up 6 months   Kate Sable, M.D., F.A.C.C.

## 2018-12-04 NOTE — Patient Instructions (Signed)
Medication Instructions:  DECREASE TOPROL XL TO 12.5 MG DAILY ( 1/2 TABLET)   Labwork: NONE  Testing/Procedures: NONE  Follow-Up: Your physician wants you to follow-up in: 6 MONTHS. You will receive a reminder letter in the mail two months in advance. If you don't receive a letter, please call our office to schedule the follow-up appointment.   Any Other Special Instructions Will Be Listed Below (If Applicable).     If you need a refill on your cardiac medications before your next appointment, please call your pharmacy.

## 2018-12-10 DIAGNOSIS — G2581 Restless legs syndrome: Secondary | ICD-10-CM | POA: Diagnosis not present

## 2018-12-10 DIAGNOSIS — E1142 Type 2 diabetes mellitus with diabetic polyneuropathy: Secondary | ICD-10-CM | POA: Diagnosis not present

## 2018-12-10 DIAGNOSIS — M545 Low back pain: Secondary | ICD-10-CM | POA: Diagnosis not present

## 2018-12-10 DIAGNOSIS — G894 Chronic pain syndrome: Secondary | ICD-10-CM | POA: Diagnosis not present

## 2018-12-11 DIAGNOSIS — E663 Overweight: Secondary | ICD-10-CM | POA: Diagnosis not present

## 2018-12-11 DIAGNOSIS — M109 Gout, unspecified: Secondary | ICD-10-CM | POA: Diagnosis not present

## 2018-12-11 DIAGNOSIS — Z6825 Body mass index (BMI) 25.0-25.9, adult: Secondary | ICD-10-CM | POA: Diagnosis not present

## 2018-12-29 ENCOUNTER — Ambulatory Visit (INDEPENDENT_AMBULATORY_CARE_PROVIDER_SITE_OTHER): Payer: Medicare Other | Admitting: Pharmacist

## 2018-12-29 DIAGNOSIS — I4891 Unspecified atrial fibrillation: Secondary | ICD-10-CM | POA: Diagnosis not present

## 2018-12-29 DIAGNOSIS — Z5181 Encounter for therapeutic drug level monitoring: Secondary | ICD-10-CM | POA: Diagnosis not present

## 2018-12-29 LAB — POCT INR: INR: 2 (ref 2.0–3.0)

## 2018-12-29 NOTE — Patient Instructions (Signed)
Description   Continue 1 tablet daily except 1/2 tablet on Tuesdays and Fridays On Allopurinol daily for gout.  Call us with any new medications, changes or any bleeding concerns @ [phone].  Recheck in 6 weeks

## 2019-01-27 DIAGNOSIS — R972 Elevated prostate specific antigen [PSA]: Secondary | ICD-10-CM | POA: Diagnosis not present

## 2019-02-05 ENCOUNTER — Telehealth: Payer: Self-pay | Admitting: *Deleted

## 2019-02-05 NOTE — Telephone Encounter (Signed)
1. Have you recently travelled abroad or to [place], [place], or [place]? NO 2. Do you currently have a fever? NO 3. Have you been in contact with someone that is currently pending confirmation of COVID19 testing or has been confirmed to have the COVID19 virus? NO 4. Are you currently experiencing fatigue or cough? NO 5. Are you currently experiencing new or worsening shortness of breath at rest or with minimal activity? NO 6. Have you been in contact with someone that was recently sick with fever/cough/fatigue? NO   **A score of 4 or more should result in cancellation of the pts cardiology appt  **A score of 2 should be provided a mask prior to admission into the lobby  **TRAVEL to a high risk area or contact with a confirmed case should stay at home, away from confirmed patient, monitor symptoms, and reach out to PCP for evisit, additional testing.   **ALL PTS WITH FEVER SHOULD BE REFERRED TO PCP FOR EVISIT  Pt. Advised that we are restricting visitors at this time and request that only patients present for check-in prior to their appointment. All other visitors should remain in their car. If necessary, only one visitor may come with the patient into the building. For everyone's safety, all patients and visitors entering our practice area should expect to be screened again prior to entering our waiting area.   APPOINTMENT CONFIRMED AND SPOUSE VERBALIZED UNDERSTANDING OF ABOVE.

## 2019-02-09 ENCOUNTER — Ambulatory Visit (INDEPENDENT_AMBULATORY_CARE_PROVIDER_SITE_OTHER): Payer: Medicare Other | Admitting: *Deleted

## 2019-02-09 DIAGNOSIS — I4891 Unspecified atrial fibrillation: Secondary | ICD-10-CM

## 2019-02-09 DIAGNOSIS — Z5181 Encounter for therapeutic drug level monitoring: Secondary | ICD-10-CM

## 2019-02-09 LAB — POCT INR: INR: 2.7 (ref 2.0–3.0)

## 2019-02-09 NOTE — Patient Instructions (Signed)
Continue 1 tablet daily except 1/2 tablet on Tuesdays and Fridays On Allopurinol daily for gout.  Call us with any new medications, changes or any bleeding concerns @ [phone].  Recheck in 8 weeks

## 2019-02-17 ENCOUNTER — Telehealth: Payer: Self-pay | Admitting: Student

## 2019-02-17 NOTE — Telephone Encounter (Signed)
Patient called in regards to medication metoprolol succinate (TOPROL XL) 25 MG 24 hr  States that he has no engery, shortness of breath.

## 2019-02-17 NOTE — Telephone Encounter (Signed)
I spoke with patient, he will stop Toprol and call us in a couple of weeks with update

## 2019-02-17 NOTE — Telephone Encounter (Signed)
DC Toprol-XL to see if this helps.

## 2019-02-17 NOTE — Telephone Encounter (Signed)
Over the last month patient has had more fatigue and has limited tolerance to walking very far. In January his metoprolol is reduced to 12.5 mg but he doesn't think it helped.He has had a couple episodes of low blood sugars 60-70's. Says he eats snacks all day. His BP today was 108/72 HR 76, weight 212 lbs- unchanged from January. Mood wise is no different,his wife's condition is not better but no worse.

## 2019-03-05 ENCOUNTER — Telehealth: Payer: Self-pay | Admitting: Cardiovascular Disease

## 2019-03-05 NOTE — Telephone Encounter (Signed)
Pt off Toprol for past 2 weeks, still feels SOB,tired. BP have been 120's/80's, HR 75.He is going to call his pcp too

## 2019-03-05 NOTE — Telephone Encounter (Signed)
Patient called stating that he is fatigue, no energy. He is wanting to know if his medications are causing this. States that he has felt this way for about 2 weeks now.   [phone])

## 2019-03-05 NOTE — Telephone Encounter (Signed)
Patient has planned to call his pcp

## 2019-03-05 NOTE — Telephone Encounter (Signed)
I would have him speak with his PCP. He may need some labs and perhaps a virtual visit with PCP. Vitals look good.

## 2019-03-10 DIAGNOSIS — E162 Hypoglycemia, unspecified: Secondary | ICD-10-CM | POA: Diagnosis not present

## 2019-03-10 DIAGNOSIS — Z6826 Body mass index (BMI) 26.0-26.9, adult: Secondary | ICD-10-CM | POA: Diagnosis not present

## 2019-03-10 DIAGNOSIS — I4891 Unspecified atrial fibrillation: Secondary | ICD-10-CM | POA: Diagnosis not present

## 2019-03-10 DIAGNOSIS — Z7901 Long term (current) use of anticoagulants: Secondary | ICD-10-CM | POA: Diagnosis not present

## 2019-03-10 DIAGNOSIS — R0600 Dyspnea, unspecified: Secondary | ICD-10-CM | POA: Diagnosis not present

## 2019-03-10 DIAGNOSIS — D519 Vitamin B12 deficiency anemia, unspecified: Secondary | ICD-10-CM | POA: Diagnosis not present

## 2019-03-10 DIAGNOSIS — R0602 Shortness of breath: Secondary | ICD-10-CM | POA: Diagnosis not present

## 2019-03-10 DIAGNOSIS — R5383 Other fatigue: Secondary | ICD-10-CM | POA: Diagnosis not present

## 2019-03-10 LAB — PROTIME-INR: INR: 1.8 — AB (ref <=1.1)

## 2019-03-18 ENCOUNTER — Ambulatory Visit (INDEPENDENT_AMBULATORY_CARE_PROVIDER_SITE_OTHER): Payer: Medicare Other | Admitting: Pharmacist Clinician (PhC)/ Clinical Pharmacy Specialist

## 2019-03-18 DIAGNOSIS — I4891 Unspecified atrial fibrillation: Secondary | ICD-10-CM | POA: Diagnosis not present

## 2019-03-18 DIAGNOSIS — Z5181 Encounter for therapeutic drug level monitoring: Secondary | ICD-10-CM

## 2019-03-25 ENCOUNTER — Telehealth: Payer: Self-pay | Admitting: Cardiovascular Disease

## 2019-03-25 NOTE — Telephone Encounter (Signed)
Pt c/o SOB/coughing/lightheadedness/weakness off and on at least several times daily (getting worse from previous phone note). Pt denies any swelling/weight gain/chest pain. BP 137/79 HR 64 - pt says he "just doesn't feel right" and doesn't normally "give out" of energy as quickly as he is now.

## 2019-03-25 NOTE — Telephone Encounter (Signed)
Patient has no energy, gets to coughing and feels like he is going to pass out.  Michela Pitcher he can tell that something is not right and wants to know what can be done next.

## 2019-03-26 NOTE — Telephone Encounter (Signed)
Pt aware and agreeable to ED evaluation, says if symptoms do not get any worse will wait until the morning and go to [facility] ED if he gets worse in the next couple of hours he will go tonight

## 2019-03-26 NOTE — Telephone Encounter (Signed)
Direct him to the ED. He'll need a chest xray to see if there is any underlying infection. He may need IV fluids as well.

## 2019-03-26 NOTE — Telephone Encounter (Signed)
Pt doesn't know what O2 has been - says yesterday had some episode of mild chest pain walking up and down the steps at his home that didn't last long. Spoke with pcp who suggested pt contact us and think he needs an in person evaluation with SOB/weakness. Says he is gradually getting worse

## 2019-03-26 NOTE — Telephone Encounter (Signed)
Any idea what O2 sats are? I reviewed labs drawn by PCP on 4/21 and they appeared to be fine. What did his PCP suggest? If he has SOB and cough he may need an in-person evaluation by his PCP if he hasn't already done so.

## 2019-03-27 ENCOUNTER — Emergency Department (HOSPITAL_COMMUNITY): Payer: Medicare Other

## 2019-03-27 ENCOUNTER — Emergency Department (HOSPITAL_COMMUNITY)
Admission: EM | Admit: 2019-03-27 | Discharge: 2019-03-27 | Disposition: A | Discharge status: Home / Self Care | Payer: Medicare Other | Attending: Emergency Medicine | Admitting: Emergency Medicine

## 2019-03-27 ENCOUNTER — Encounter (HOSPITAL_COMMUNITY): Payer: Self-pay | Admitting: Emergency Medicine

## 2019-03-27 ENCOUNTER — Other Ambulatory Visit: Payer: Self-pay

## 2019-03-27 DIAGNOSIS — R5383 Other fatigue: Secondary | ICD-10-CM | POA: Insufficient documentation

## 2019-03-27 DIAGNOSIS — Z7984 Long term (current) use of oral hypoglycemic drugs: Secondary | ICD-10-CM | POA: Diagnosis not present

## 2019-03-27 DIAGNOSIS — Z7901 Long term (current) use of anticoagulants: Secondary | ICD-10-CM | POA: Diagnosis not present

## 2019-03-27 DIAGNOSIS — Z87891 Personal history of nicotine dependence: Secondary | ICD-10-CM | POA: Insufficient documentation

## 2019-03-27 DIAGNOSIS — Z79899 Other long term (current) drug therapy: Secondary | ICD-10-CM | POA: Diagnosis not present

## 2019-03-27 DIAGNOSIS — I10 Essential (primary) hypertension: Secondary | ICD-10-CM | POA: Insufficient documentation

## 2019-03-27 DIAGNOSIS — R0602 Shortness of breath: Secondary | ICD-10-CM | POA: Insufficient documentation

## 2019-03-27 DIAGNOSIS — R0600 Dyspnea, unspecified: Secondary | ICD-10-CM | POA: Diagnosis not present

## 2019-03-27 LAB — CBC WITH DIFFERENTIAL/PLATELET
Abs Immature Granulocytes: 0.03 10*3/uL (ref 0.00–0.07)
Basophils Absolute: 0.1 10*3/uL (ref 0.0–0.1)
Basophils Relative: 1 %
Eosinophils Absolute: 0.2 10*3/uL (ref 0.0–0.5)
Eosinophils Relative: 2 %
HCT: 44.3 % (ref 39.0–52.0)
Hemoglobin: 14.7 g/dL (ref 13.0–17.0)
Immature Granulocytes: 0 %
Lymphocytes Relative: 18 %
Lymphs Abs: 1.2 10*3/uL (ref 0.7–4.0)
MCH: 31.5 pg (ref 26.0–34.0)
MCHC: 33.2 g/dL (ref 30.0–36.0)
MCV: 94.9 fL (ref 80.0–100.0)
Monocytes Absolute: 0.7 10*3/uL (ref 0.1–1.0)
Monocytes Relative: 11 %
Neutro Abs: 4.7 10*3/uL (ref 1.7–7.7)
Neutrophils Relative %: 68 %
Platelets: 178 10*3/uL (ref 150–400)
RBC: 4.67 MIL/uL (ref 4.22–5.81)
RDW: 13.4 % (ref 11.5–15.5)
WBC: 6.9 10*3/uL (ref 4.0–10.5)
nRBC: 0 % (ref 0.0–0.2)

## 2019-03-27 LAB — COMPREHENSIVE METABOLIC PANEL
ALT: 20 U/L (ref 0–44)
AST: 24 U/L (ref 15–41)
Albumin: 4.2 g/dL (ref 3.5–5.0)
Alkaline Phosphatase: 58 U/L (ref 38–126)
Anion gap: 10 (ref 5–15)
BUN: 13 mg/dL (ref 8–23)
CO2: 25 mmol/L (ref 22–32)
Calcium: 8.9 mg/dL (ref 8.9–10.3)
Chloride: 104 mmol/L (ref 98–111)
Creatinine, Ser: 1.26 mg/dL — ABNORMAL HIGH (ref 0.61–1.24)
GFR calc Af Amer: >60 mL/min (ref >60)
GFR calc non Af Amer: 55 mL/min — ABNORMAL LOW (ref >60)
Glucose, Bld: 74 mg/dL (ref 70–99)
Potassium: 3.7 mmol/L (ref 3.5–5.1)
Sodium: 139 mmol/L (ref 135–145)
Total Bilirubin: 0.9 mg/dL (ref 0.3–1.2)
Total Protein: 6.7 g/dL (ref 6.5–8.1)

## 2019-03-27 LAB — TROPONIN I: Troponin I: <0.03 ng/mL (ref <0.03)

## 2019-03-27 LAB — PROTIME-INR
INR: 2.3 — ABNORMAL HIGH (ref 0.8–1.2)
Prothrombin Time: 24.5 seconds — ABNORMAL HIGH (ref 11.4–15.2)

## 2019-03-27 NOTE — ED Provider Notes (Signed)
[facility] EMERGENCY DEPARTMENT Provider Note   CSN: [id number] Arrival date & time: 03/27/19  1257    History   Chief Complaint Chief Complaint  Patient presents with  . Shortness of Breath  . Fatigue    HPI [name] is a 78 y.o. male.     HPI  78 year old male presents with shortness of breath and fatigue/weakness.  He states the symptoms have been ongoing for about 2 months.  He has a little bit of a cough when he first wakes up but otherwise no significant cough.  He denies fevers, headache, chest pain or abdominal pain.  No vomiting.  No leg swelling.  The weakness is generalized and feels like he is just tired and would fall asleep.  No significant weight gain.  He states that intermittently he has been having lightheaded episodes, especially when doing activities in the yard and usually he stops, eat something and drink something and he gets better.  He is presuming that these are related to his low sugar.  Otherwise the shortness of breath feels like he is breathing heavy but he is not gasping for air or not getting enough air.  He states there is no chest pain but he felt some upper abdominal discomfort that he thought was probably GERD and went away with antacids.  Last time this happened was 2 days ago.  He talked to his cardiologist through the nurse and it was recommended to come to the ER for evaluation.  Past Medical History:  Diagnosis Date  . Barrett's esophagus    short-segment, diagnosed in 2007 by Dr. [name]  . BPH (benign prostatic hyperplasia)   . GERD (gastroesophageal reflux disease)   . Hiatal hernia    small  . HTN (hypertension)   . PAF (paroxysmal atrial fibrillation) ([place])   . S/P colonoscopy 2007   unremarkable  . S/P endoscopy Oct 2011    Salmon-colored epithelium coming up to 37 cm from the   . Vertigo     Patient Active Problem List   Diagnosis Date Noted  . Encounter for therapeutic drug monitoring 07/02/2018  . DIZZINESS 05/09/2010   . CHEST PAIN 05/09/2010  . GERD 01/31/2009  . BARRETTS ESOPHAGUS 01/31/2009    Past Surgical History:  Procedure Laterality Date  . APPENDECTOMY    . CHOLECYSTECTOMY    . COLONOSCOPY  02/06/2006   Dr. [name]- single anal papilla, o/w normal rectum, normal colon  . COLONOSCOPY N/A 09/13/2016   Procedure: COLONOSCOPY;  Surgeon: [name], MD;  Location: [facility] ENDO SUITE;  Service: Endoscopy;  Laterality: N/A;  145   . ESOPHAGOGASTRODUODENOSCOPY  09/15/2010   Dr. [name]- Salmon-colored epithelium coming up to 37 cm from the   . ESOPHAGOGASTRODUODENOSCOPY N/A 07/02/2013   Dr. [name]- Barretts on bx. hiatal hernia  . ESOPHAGOGASTRODUODENOSCOPY N/A 09/13/2016   Procedure: ESOPHAGOGASTRODUODENOSCOPY (EGD);  Surgeon: [name], MD;  Location: [facility] ENDO SUITE;  Service: Endoscopy;  Laterality: N/A;  . ROTATOR CUFF REPAIR    . YAG LASER APPLICATION Right 04/22/8755   Procedure: YAG LASER APPLICATION;  Surgeon: [name], MD;  Location: [facility] ORS;  Service: Ophthalmology;  Laterality: Right;        Home Medications    Prior to Admission medications   Medication Sig Start Date End Date Taking? Authorizing Provider  ALPRAZolam Duanne Moron) 1 MG tablet Take 0.5 mg by mouth at bedtime as needed for anxiety.    [provider]  escitalopram (LEXAPRO) 10 MG  tablet Take 10 mg by mouth daily.    [provider]  fexofenadine (ALLEGRA) 180 MG tablet Take 180 mg by mouth every morning.    [provider]  gabapentin (NEURONTIN) 300 MG capsule Take 300 mg by mouth at bedtime.    [provider]  glipiZIDE (GLUCOTROL) 5 MG tablet  08/12/18   [provider]  pantoprazole (PROTONIX) 40 MG tablet TAKE ONE TABLET BY MOUTH TWICE A DAY 09/01/18   Annitta Needs, NP  PREDNISONE PO Take by mouth.    [provider]  Tamsulosin HCl (FLOMAX) 0.4 MG CAPS Take 0.4 mg by mouth 2 (two) times daily.  05/10/11   [provider]  warfarin (COUMADIN) 5 MG  tablet Take 1 tablet daily except 1/2 tablet on Tuesdays and Fridays or as directed 10/01/18   Herminio Commons, MD    Family History Family History  Problem Relation Age of Onset  . Prostate cancer Father        deceased  . Stroke Mother        deceased  . Colon cancer Neg Hx     Social History Social History   Tobacco Use  . Smoking status: Former Smoker    Packs/day: 1.50    Years: 30.00    Pack years: 45.00    Types: Cigarettes    Last attempt to quit: 11/18/1993    Years since quitting: 25.3  . Smokeless tobacco: Never Used  . Tobacco comment: quit in the 90's  Substance Use Topics  . Alcohol use: No  . Drug use: No     Allergies   Patient has no known allergies.   Review of Systems Review of Systems  Constitutional: Positive for fatigue. Negative for fever.  Respiratory: Positive for cough and shortness of breath.   Cardiovascular: Negative for chest pain and leg swelling.  Gastrointestinal: Negative for abdominal pain and vomiting.  Neurological: Positive for weakness and light-headedness. Negative for headaches.  All other systems reviewed and are negative.    Physical Exam Updated Vital Signs BP 118/72   Pulse 71   Temp 97.7 F (36.5 C)   Resp 13   Ht 5\' 9"  (1.753 m)   Wt 99.8 kg   SpO2 94%   BMI 32.49 kg/m   Physical Exam Vitals signs and nursing note reviewed.  Constitutional:      General: He is not in acute distress.    Appearance: He is well-developed. He is obese. He is not ill-appearing or diaphoretic.  HENT:     Head: Normocephalic and atraumatic.     Right Ear: External ear normal.     Left Ear: External ear normal.     Nose: Nose normal.  Eyes:     General:        Right eye: No discharge.        Left eye: No discharge.     Extraocular Movements: Extraocular movements intact.     Pupils: Pupils are equal, round, and reactive to light.  Neck:     Musculoskeletal: Neck supple.  Cardiovascular:     Rate and Rhythm:  Normal rate and regular rhythm.     Heart sounds: Normal heart sounds.  Pulmonary:     Effort: Pulmonary effort is normal.     Breath sounds: Decreased breath sounds (mild, at the bases) present.  Abdominal:     Palpations: Abdomen is soft.     Tenderness: There is no abdominal tenderness.  Musculoskeletal:  Right lower leg: No edema.     Left lower leg: No edema.  Skin:    General: Skin is warm and dry.  Neurological:     Mental Status: He is alert.     Comments: CN 3-12 grossly intact. 5/5 strength in all 4 extremities. Grossly normal sensation. Normal finger to nose.   Psychiatric:        Mood and Affect: Mood is not anxious.      ED Treatments / Results  Labs (all labs ordered are listed, but only abnormal results are displayed) Labs Reviewed  COMPREHENSIVE METABOLIC PANEL - Abnormal; Notable for the following components:      Result Value   Creatinine, Ser 1.26 (*)    GFR calc non Af Amer 55 (*)    All other components within normal limits  PROTIME-INR - Abnormal; Notable for the following components:   Prothrombin Time 24.5 (*)    INR 2.3 (*)    All other components within normal limits  TROPONIN I  CBC WITH DIFFERENTIAL/PLATELET    EKG EKG Interpretation  Date/Time:  Friday Mar 27 2019 13:18:35 EDT Ventricular Rate:  70 PR Interval:    QRS Duration: 92 QT Interval:  396 QTC Calculation: 428 R Axis:   4 Text Interpretation:  Sinus rhythm Low voltage, precordial leads Abnormal R-wave progression, early transition afib not present compared to June 2019 Confirmed by Sherwood Gambler [phone]) on 03/27/2019 1:54:49 PM   Radiology Dg Chest 2 View  Result Date: 03/27/2019 CLINICAL DATA:  Progressive dyspnea. EXAM: CHEST - 2 VIEW COMPARISON:  Chest x-ray dated 05/02/2018 FINDINGS: The heart is at the upper limits of normal. Pulmonary vascularity is at the upper limits of normal. Aortic atherosclerosis. No infiltrates or effusions. No significant bone abnormality.  IMPRESSION: Slight chronic pulmonary vascular prominence. Aortic Atherosclerosis (ICD10-I70.0). Electronically Signed   By: Lorriane Shire M.D.   On: 03/27/2019 13:56    Procedures Procedures (including critical care time)  Medications Ordered in ED Medications - No data to display   Initial Impression / Assessment and Plan / ED Course  I have reviewed the triage vital signs and the nursing notes.  Pertinent labs & imaging results that were available during my care of the patient were reviewed by me and considered in my medical decision making (see chart for details).        No clear cause for the patient's symptoms that have been ongoing for 2 months.  Labs are overall near baseline and reassuring.  He does endorse that sometimes he gets some palpitations during these lightheaded episodes and he does have a history of A. fib but is not currently in A. fib.  Some exertional symptoms so I think it is reasonable to have him follow-up with his cardiologist for outpatient work-up of this but I doubt acute MI or unstable angina.  I think PE is pretty unlikely given the length of symptoms.  No infectious signs or symptoms to suggest pneumonia or COVID.  He does not appear to have heart failure on exam or on chest x-ray.  Discharged home with return precautions.  Final Clinical Impressions(s) / ED Diagnoses   Final diagnoses:  Shortness of breath  Fatigue, unspecified type    ED Discharge Orders    None       Sherwood Gambler, MD 03/27/19 1456

## 2019-03-27 NOTE — Telephone Encounter (Signed)
Patient afraid of going to ED because of Corona Virus.He feels weak and SOB and understands his health is the most important to [place]. He agrees to go be evaluated

## 2019-03-27 NOTE — ED Triage Notes (Signed)
Pt states that he has been sob and fatigued for about 2 months getting worse

## 2019-03-27 NOTE — Telephone Encounter (Signed)
Please give pt a call-- he's hesitant about going to the ER.

## 2019-03-27 NOTE — Discharge Instructions (Signed)
If you develop chest pain, shortness of breath, fever, vomiting, abdominal or back pain, feeling like you're going to pass out, or any other new/concerning symptoms then return to the ER for evaluation.  

## 2019-04-07 ENCOUNTER — Telehealth: Payer: Self-pay | Admitting: *Deleted

## 2019-04-07 NOTE — Telephone Encounter (Signed)
° ° °  COVID-19 Pre-Screening Questions: ° °• In the past 7 to 10 days have you had a cough,  shortness of breath, headache, congestion, fever, body aches, chills, sore throat, or sudden loss of taste or sense of smell?  NO °• Have you been around anyone with known Covid 19.  NO °• Have you been around anyone who is awaiting Covid 19 test results in the past 7 to 10 days?  NO °• Have you been around anyone who has been exposed to Covid 19, or has mentioned symptoms of Covid 19 within the past 7 to 10 days?  NO ° °If you have any concerns/questions about symptoms patients report during screening (either on the phone or at threshold). Contact the provider seeing the patient or DOD for further guidance.  If neither are available contact a member of the leadership team. ° ° ° °   ° ° ° ° °

## 2019-04-08 ENCOUNTER — Ambulatory Visit (INDEPENDENT_AMBULATORY_CARE_PROVIDER_SITE_OTHER): Payer: Medicare Other | Admitting: *Deleted

## 2019-04-08 ENCOUNTER — Other Ambulatory Visit: Payer: Self-pay

## 2019-04-08 DIAGNOSIS — I4891 Unspecified atrial fibrillation: Secondary | ICD-10-CM | POA: Diagnosis not present

## 2019-04-08 DIAGNOSIS — Z5181 Encounter for therapeutic drug level monitoring: Secondary | ICD-10-CM | POA: Diagnosis not present

## 2019-04-08 LAB — POCT INR: INR: 2.7 (ref 2.0–3.0)

## 2019-04-08 NOTE — Patient Instructions (Signed)
Continue coumadin 1 tablet daily except 1/2 tablet on Tuesdays and Fridays On Allopurinol daily for gout.  Call us with any new medications, changes or any bleeding concerns @ [phone].  Recheck in 8 weeks

## 2019-04-22 ENCOUNTER — Telehealth: Payer: Self-pay | Admitting: *Deleted

## 2019-04-22 NOTE — Telephone Encounter (Signed)
Patient called stating that he has gout. PCP has put him on Prednisone. He is wanting to know if this will interfere taking his coumdin.   Please call [phone].

## 2019-04-22 NOTE — Telephone Encounter (Signed)
Pt is starting prednisone tonight for gout by Dr Caprice Beaver.  Will take 30mg  x 3 days, 20mg  x 3 days, 10mg  x 3 days and finish on 6/12.  Will decrease coumadin to 5mg  daily except 2.5mg  on Tuesdays, Thursdays and Saturdays thru 6/9.  Restart regular home dose on 6/10.  INR appt moved up to 05/04/19 at 10:30am in [place].  Pt told and verbalized understanding.

## 2019-04-24 ENCOUNTER — Ambulatory Visit (INDEPENDENT_AMBULATORY_CARE_PROVIDER_SITE_OTHER): Payer: Medicare Other | Admitting: Urology

## 2019-04-24 DIAGNOSIS — R972 Elevated prostate specific antigen [PSA]: Secondary | ICD-10-CM

## 2019-04-24 DIAGNOSIS — R351 Nocturia: Secondary | ICD-10-CM | POA: Diagnosis not present

## 2019-04-24 DIAGNOSIS — N401 Enlarged prostate with lower urinary tract symptoms: Secondary | ICD-10-CM | POA: Diagnosis not present

## 2019-05-01 DIAGNOSIS — M79671 Pain in right foot: Secondary | ICD-10-CM | POA: Diagnosis not present

## 2019-05-01 DIAGNOSIS — M722 Plantar fascial fibromatosis: Secondary | ICD-10-CM | POA: Diagnosis not present

## 2019-05-12 ENCOUNTER — Ambulatory Visit (INDEPENDENT_AMBULATORY_CARE_PROVIDER_SITE_OTHER): Payer: Medicare Other | Admitting: *Deleted

## 2019-05-12 DIAGNOSIS — Z5181 Encounter for therapeutic drug level monitoring: Secondary | ICD-10-CM | POA: Diagnosis not present

## 2019-05-12 DIAGNOSIS — I4891 Unspecified atrial fibrillation: Secondary | ICD-10-CM

## 2019-05-12 LAB — POCT INR
INR: 1.9 — AB (ref 2.0–3.0)
INR: 1.9 — AB (ref 2.0–3.0)

## 2019-05-12 NOTE — Patient Instructions (Signed)
Take coumadin 1 tablet tonight then resume 1 tablet daily except 1/2 tablet on Tuesdays and Fridays On Allopurinol daily for gout.  Call us with any new medications, changes or any bleeding concerns @ [phone].  Recheck in 4 weeks

## 2019-05-18 ENCOUNTER — Telehealth: Payer: Self-pay | Admitting: *Deleted

## 2019-05-18 NOTE — Telephone Encounter (Signed)
Called pt.  (Saw pt last week for INR check and noticed eye had a broken blood vessel and was red.  INR was 1.9  Told pt this is usually not caused by warfarin and to monitor).   Pt states Left eye is still blood shot.  Has not gotten any better.  Told pt he could hold coumadin tonight and use natural tears or lubricating drops if he wants too.  He will continue to monitor and call eye doctor if it does not improve soon.  Told pt to call back for INR check before going to [facility] if he decides to do so.  He is in agreement and verbalized understanding.

## 2019-05-18 NOTE — Telephone Encounter (Signed)
-----   Message from Massie Maroon, [place] sent at 05/18/2019  1:07 PM EDT ----- Pt wanting to talk to you about some bleeding he has been having phone # [phone]  [facility]

## 2019-06-08 ENCOUNTER — Other Ambulatory Visit: Payer: Self-pay

## 2019-06-08 ENCOUNTER — Ambulatory Visit (INDEPENDENT_AMBULATORY_CARE_PROVIDER_SITE_OTHER): Payer: Medicare Other | Admitting: *Deleted

## 2019-06-08 DIAGNOSIS — I4891 Unspecified atrial fibrillation: Secondary | ICD-10-CM

## 2019-06-08 DIAGNOSIS — Z5181 Encounter for therapeutic drug level monitoring: Secondary | ICD-10-CM

## 2019-06-08 LAB — POCT INR: INR: 2.7 (ref 2.0–3.0)

## 2019-06-08 NOTE — Patient Instructions (Signed)
Continue coumadin 1 tablet daily except 1/2 tablet on Tuesdays and Fridays On Allopurinol daily for gout.  Call us with any new medications, changes or any bleeding concerns @ [phone].  Recheck in 4 weeks

## 2019-06-10 ENCOUNTER — Telehealth: Payer: Self-pay | Admitting: Cardiovascular Disease

## 2019-06-10 DIAGNOSIS — M545 Low back pain: Secondary | ICD-10-CM | POA: Diagnosis not present

## 2019-06-10 DIAGNOSIS — E1142 Type 2 diabetes mellitus with diabetic polyneuropathy: Secondary | ICD-10-CM | POA: Diagnosis not present

## 2019-06-10 DIAGNOSIS — G894 Chronic pain syndrome: Secondary | ICD-10-CM | POA: Diagnosis not present

## 2019-06-10 DIAGNOSIS — G2581 Restless legs syndrome: Secondary | ICD-10-CM | POA: Diagnosis not present

## 2019-06-10 NOTE — Telephone Encounter (Signed)
Virtual Visit Pre-Appointment Phone Call  "(Name), I am calling you today to discuss your upcoming appointment. We are currently trying to limit exposure to the virus that causes COVID-19 by seeing patients at home rather than in the office."  1. "What is the BEST phone number to call the day of the visit?" - include this in appointment notes  2. Do you have or have access to (through a family member/friend) a smartphone with video capability that we can use for your visit?" a. If yes - list this number in appt notes as cell (if different from BEST phone #) and list the appointment type as a VIDEO visit in appointment notes b. If no - list the appointment type as a PHONE visit in appointment notes  3. Confirm consent - "In the setting of the current Covid19 crisis, you are scheduled for a (phone or video) visit with your provider on (date) at (time).  Just as we do with many in-office visits, in order for you to participate in this visit, we must obtain consent.  If you'd like, I can send this to your mychart (if signed up) or email for you to review.  Otherwise, I can obtain your verbal consent now.  All virtual visits are billed to your insurance company just like a normal visit would be.  By agreeing to a virtual visit, we'd like you to understand that the technology does not allow for your provider to perform an examination, and thus may limit your provider's ability to fully assess your condition. If your provider identifies any concerns that need to be evaluated in person, we will make arrangements to do so.  Finally, though the technology is pretty good, we cannot assure that it will always work on either your or our end, and in the setting of a video visit, we may have to convert it to a phone-only visit.  In either situation, we cannot ensure that we have a secure connection.  Are you willing to proceed?" STAFF: Did the patient verbally acknowledge consent to telehealth visit? Document  YES/NO here: yes 4.   5. Advise patient to be prepared - "Two hours prior to your appointment, go ahead and check your blood pressure, pulse, oxygen saturation, and your weight (if you have the equipment to check those) and write them all down. When your visit starts, your provider will ask you for this information. If you have an Apple Watch or Kardia device, please plan to have heart rate information ready on the day of your appointment. Please have a pen and paper handy nearby the day of the visit as well."  6. Give patient instructions for MyChart download to smartphone OR Doximity/[email or web address] as below if video visit (depending on what platform provider is using)  7. Inform patient they will receive a phone call 15 minutes prior to their appointment time (may be from unknown caller ID) so they should be prepared to answer    TELEPHONE CALL NOTE  [name] has been deemed a candidate for a follow-up tele-health visit to limit community exposure during the Covid-19 pandemic. I spoke with the patient via phone to ensure availability of phone/video source, confirm preferred email & phone number, and discuss instructions and expectations.  I reminded [name] to be prepared with any vital sign and/or heart rhythm information that could potentially be obtained via home monitoring, at the time of his visit. I reminded [name] to expect a phone call  prior to his visit.  [name] 06/10/2019 1:11 PM   INSTRUCTIONS FOR DOWNLOADING THE MYCHART APP TO SMARTPHONE  - The patient must first make sure to have activated MyChart and know their login information - If Apple, go to [employer] and type in MyChart in the search bar and download the app. If Android, ask patient to go to [employer] and type in [place] in the search bar and download the app. The app is free but as with any other app downloads, their phone may require them to verify saved payment information or  Apple/Android password.  - The patient will need to then log into the app with their MyChart username and password, and select [place] as their healthcare provider to link the account. When it is time for your visit, go to the MyChart app, find appointments, and click Begin Video Visit. Be sure to Select Allow for your device to access the Microphone and Camera for your visit. You will then be connected, and your provider will be with you shortly.  **If they have any issues connecting, or need assistance please contact MyChart service desk [phone])**  **If using a computer, in order to ensure the best quality for their visit they will need to use either of the following Internet Browsers: [employer], or Google Chrome**  IF USING DOXIMITY or DOXY.ME - The patient will receive a link just prior to their visit by text.     FULL LENGTH CONSENT FOR TELE-HEALTH VISIT   I hereby voluntarily request, consent and authorize [place] and its employed or contracted physicians, physician assistants, nurse practitioners or other licensed health care professionals (the Practitioner), to provide me with telemedicine health care services (the Services") as deemed necessary by the treating Practitioner. I acknowledge and consent to receive the Services by the Practitioner via telemedicine. I understand that the telemedicine visit will involve communicating with the Practitioner through live audiovisual communication technology and the disclosure of certain medical information by electronic transmission. I acknowledge that I have been given the opportunity to request an in-person assessment or other available alternative prior to the telemedicine visit and am voluntarily participating in the telemedicine visit.  I understand that I have the right to withhold or withdraw my consent to the use of telemedicine in the course of my care at any time, without affecting my right to future care  or treatment, and that the Practitioner or I may terminate the telemedicine visit at any time. I understand that I have the right to inspect all information obtained and/or recorded in the course of the telemedicine visit and may receive copies of available information for a reasonable fee.  I understand that some of the potential risks of receiving the Services via telemedicine include:   Delay or interruption in medical evaluation due to technological equipment failure or disruption;  Information transmitted may not be sufficient (e.g. poor resolution of images) to allow for appropriate medical decision making by the Practitioner; and/or   In rare instances, security protocols could fail, causing a breach of personal health information.  Furthermore, I acknowledge that it is my responsibility to provide information about my medical history, conditions and care that is complete and accurate to the best of my ability. I acknowledge that Practitioner's advice, recommendations, and/or decision may be based on factors not within their control, such as incomplete or inaccurate data provided by me or distortions of diagnostic images or specimens that may result from electronic  transmissions. I understand that the practice of medicine is not an exact science and that Practitioner makes no warranties or guarantees regarding treatment outcomes. I acknowledge that I will receive a copy of this consent concurrently upon execution via email to the email address I last provided but may also request a printed copy by calling the office of [place].    I understand that my insurance will be billed for this visit.   I have read or had this consent read to me.  I understand the contents of this consent, which adequately explains the benefits and risks of the Services being provided via telemedicine.   I have been provided ample opportunity to ask questions regarding this consent and the Services and have had  my questions answered to my satisfaction.  I give my informed consent for the services to be provided through the use of telemedicine in my medical care  By participating in this telemedicine visit I agree to the above.

## 2019-06-17 ENCOUNTER — Telehealth (INDEPENDENT_AMBULATORY_CARE_PROVIDER_SITE_OTHER): Payer: Medicare Other | Admitting: Cardiovascular Disease

## 2019-06-17 ENCOUNTER — Encounter: Payer: Self-pay | Admitting: Cardiovascular Disease

## 2019-06-17 ENCOUNTER — Encounter: Payer: Self-pay | Admitting: *Deleted

## 2019-06-17 VITALS — BP 114/74 | HR 74 | Ht 71.0 in | Wt 220.0 lb

## 2019-06-17 DIAGNOSIS — R0609 Other forms of dyspnea: Secondary | ICD-10-CM | POA: Diagnosis not present

## 2019-06-17 DIAGNOSIS — I209 Angina pectoris, unspecified: Secondary | ICD-10-CM

## 2019-06-17 DIAGNOSIS — R0789 Other chest pain: Secondary | ICD-10-CM

## 2019-06-17 DIAGNOSIS — Z7901 Long term (current) use of anticoagulants: Secondary | ICD-10-CM

## 2019-06-17 DIAGNOSIS — F329 Major depressive disorder, single episode, unspecified: Secondary | ICD-10-CM

## 2019-06-17 DIAGNOSIS — I480 Paroxysmal atrial fibrillation: Secondary | ICD-10-CM

## 2019-06-17 DIAGNOSIS — F32A Depression, unspecified: Secondary | ICD-10-CM

## 2019-06-17 DIAGNOSIS — E1140 Type 2 diabetes mellitus with diabetic neuropathy, unspecified: Secondary | ICD-10-CM

## 2019-06-17 DIAGNOSIS — I10 Essential (primary) hypertension: Secondary | ICD-10-CM

## 2019-06-17 MED ORDER — METOPROLOL TARTRATE 50 MG PO TABS: 50.0000 mg | ORAL_TABLET | Freq: Once | ORAL | 0 refills | Status: DC

## 2019-06-17 NOTE — Progress Notes (Signed)
Virtual Visit via Telephone Note   This visit type was conducted due to national recommendations for restrictions regarding the COVID-19 Pandemic (e.g. social distancing) in an effort to limit this patient's exposure and mitigate transmission in our community.  Due to his co-morbid illnesses, this patient is at least at moderate risk for complications without adequate follow up.  This format is felt to be most appropriate for this patient at this time.  The patient did not have access to video technology/had technical difficulties with video requiring transitioning to audio format only (telephone).  All issues noted in this document were discussed and addressed.  No physical exam could be performed with this format.  Please refer to the patient's chart for his  consent to telehealth for [facility].   Date:  06/17/2019   ID:  [name], DOB [birth year], MRN [id number]  Patient Location: Home Provider Location: Office  PCP:  [facility]  Cardiologist:  Kate Sable, MD  Electrophysiologist:  None   Evaluation Performed:  Follow-Up Visit  Chief Complaint: Paroxysmal atrial fibrillation and flutter  History of Present Illness:    [name] is a 78 y.o. male with paroxysmal atrial fibrillation and flutter.  He is anticoagulated with warfarin. He underwent a low risk nuclear stress test on 07/16/2018.  He also has a history of anxiety and depression and takes Lexapro.  He had been a Higher education careers adviser for 30 years.  He had been experiencing fatigue and Toprol-XL was ultimately discontinued in March 2020.  He had some shortness of breath in May and went to the ED at [facility].  Chest x-ray on 03/27/2019 showed slight chronic pulmonary vascular prominence and aortic atherosclerosis.  CBC was normal.  Creatinine was elevated at 1.26.  Troponin was normal.  He continues to have some exertional dyspnea when rushing up the basement steps or when working outdoors. He  has had some near syncope. He denies chest pain but does endorse some tightness.  The patient does not have symptoms concerning for COVID-19 infection (fever, chills, cough, or new shortness of breath).    Past Medical History:  Diagnosis Date  . Barrett's esophagus    short-segment, diagnosed in 2007 by Dr. [name]  . BPH (benign prostatic hyperplasia)   . GERD (gastroesophageal reflux disease)   . Hiatal hernia    small  . HTN (hypertension)   . PAF (paroxysmal atrial fibrillation) ([place])   . S/P colonoscopy 2007   unremarkable  . S/P endoscopy Oct 2011    Salmon-colored epithelium coming up to 37 cm from the   . Vertigo    Past Surgical History:  Procedure Laterality Date  . APPENDECTOMY    . CHOLECYSTECTOMY    . COLONOSCOPY  02/06/2006   Dr. [name]- single anal papilla, o/w normal rectum, normal colon  . COLONOSCOPY N/A 09/13/2016   Procedure: COLONOSCOPY;  Surgeon: [name], MD;  [facility];  Service: Endoscopy;  Laterality: N/A;  145   . ESOPHAGOGASTRODUODENOSCOPY  09/15/2010   Dr. [name]- Salmon-colored epithelium coming up to 37 cm from the   . ESOPHAGOGASTRODUODENOSCOPY N/A 07/02/2013   Dr. [name]- Barretts on bx. hiatal hernia  . ESOPHAGOGASTRODUODENOSCOPY N/A 09/13/2016   Procedure: ESOPHAGOGASTRODUODENOSCOPY (EGD);  Surgeon: [name], MD;  [facility];  Service: Endoscopy;  Laterality: N/A;  . ROTATOR CUFF REPAIR    . YAG LASER APPLICATION Right 01/22/6293   Procedure: YAG LASER APPLICATION;  Surgeon: [name], MD;  Location: [facility] ORS;  Service: Ophthalmology;  Laterality: Right;     Current Meds  Medication Sig  . allopurinol (ZYLOPRIM) 100 MG tablet Take 1 tablet by mouth daily.  [name] ALPRAZolam (XANAX) 1 MG tablet Take 0.5 mg by mouth at bedtime as needed for anxiety.  . DULoxetine (CYMBALTA) 30 MG capsule Take 30 mg by mouth daily.  [name] escitalopram (LEXAPRO) 10 MG tablet Take 10 mg by mouth daily.  . fexofenadine (ALLEGRA)  180 MG tablet Take 180 mg by mouth every morning.  . gabapentin (NEURONTIN) 300 MG capsule Take 300 mg by mouth 2 (two) times daily.   [name] glipiZIDE (GLUCOTROL) 5 MG tablet Take 5 mg by mouth daily before breakfast.   . pantoprazole (PROTONIX) 40 MG tablet TAKE ONE TABLET BY MOUTH TWICE A DAY  . Tamsulosin HCl (FLOMAX) 0.4 MG CAPS Take 0.4 mg by mouth 2 (two) times daily.   [name] warfarin (COUMADIN) 5 MG tablet Take 1 tablet daily except 1/2 tablet on Tuesdays and Fridays or as directed     Allergies:   Patient has no known allergies.   Social History   Tobacco Use  . Smoking status: Former Smoker    Packs/day: 1.50    Years: 30.00    Pack years: 45.00    Types: Cigarettes    Quit date: 11/18/1993    Years since quitting: 25.5  . Smokeless tobacco: Never Used  . Tobacco comment: quit in the 90's  Substance Use Topics  . Alcohol use: No  . Drug use: No     Family Hx: The patient's family history includes Prostate cancer in his father; Stroke in his mother. There is no history of Colon cancer.  ROS:   Please see the history of present illness.     All other systems reviewed and are negative.   Prior CV studies:   The following studies were reviewed today:  Echocardiogram 05/13/18:  - Left ventricle: The cavity size was normal. Wall thickness was   normal. Systolic function was normal. The estimated ejection   fraction was in the range of 55% to 60%. Wall motion was normal;   there were no regional wall motion abnormalities. The study was   not technically sufficient to allow evaluation of LV diastolic   dysfunction due to atrial fibrillation. - Aortic valve: Valve area (VTI): 2.62 cm^2. Valve area (Vmax):   2.77 cm^2. Valve area (Vmean): 2.33 cm^2. - Technically adequate study.  Event monitor 05/21/18:   Atrial fibrillation and flutter seen. One episode of rapid atrial flutter, 160 bpm noted.  Labs/Other Tests and Data Reviewed:    EKG:  No ECG reviewed.  Recent Labs:  03/27/2019: ALT 20; BUN 13; Creatinine, Ser 1.26; Hemoglobin 14.7; Platelets 178; Potassium 3.7; Sodium 139   Recent Lipid Panel No results found for: CHOL, TRIG, HDL, CHOLHDL, LDLCALC, LDLDIRECT  Wt Readings from Last 3 Encounters:  06/17/19 220 lb (99.8 kg)  03/27/19 220 lb (99.8 kg)  12/04/18 212 lb (96.2 kg)     Objective:    Vital Signs:  BP 114/74   Pulse 74   Ht 5\' 11"  (1.803 m)   Wt 220 lb (99.8 kg)   BMI 30.68 kg/m    VITAL SIGNS:  reviewed  ASSESSMENT & PLAN:    1.  Paroxysmal atrial fibrillation: Toprol-XL led to bradycardia and fatigue even at low doses so this was ultimately discontinued. Anticoagulated with warfarin.  INR 2.7 on 06/08/2019.  2.  Hypertension: Blood pressure is  normal.  3.  Type 2 diabetes mellitus: Diagnosed in the fall 2019.  Currently on glipizide.  4.  Depression: Currently on Lexapro 10 mg.  This appears to be situational and related to his wife's failing health.  5.  Chest tightness and exertional dyspnea: He continues to experience activity limiting exertional dyspnea.  He quit smoking in 1994.  Stress test was low risk in August 2019.  I will proceed with coronary CT angiography which will also evaluate his lungs for any potential pathology.  If these tests are unremarkable, I would recommend pulmonary function testing.    COVID-19 Education: The signs and symptoms of COVID-19 were discussed with the patient and how to seek care for testing (follow up with PCP or arrange E-visit).  The importance of social distancing was discussed today.  Time:   Today, I have spent 15 minutes with the patient with telehealth technology discussing the above problems.     Medication Adjustments/Labs and Tests Ordered: Current medicines are reviewed at length with the patient today.  Concerns regarding medicines are outlined above.   Tests Ordered: No orders of the defined types were placed in this encounter.   Medication Changes: No orders of  the defined types were placed in this encounter.   Follow Up:  Virtual Visit or In Person in 3 month(s)  Signed, Kate Sable, MD  06/17/2019 2:27 PM    [place]

## 2019-06-17 NOTE — Addendum Note (Signed)
Addended by: Merlene Laughter on: 06/17/2019 03:31 PM   Modules accepted: Orders

## 2019-06-17 NOTE — Patient Instructions (Addendum)
Medication Instructions:   Your physician recommends that you continue on your current medications as directed. Please refer to the Current Medication list given to you today.  Labwork:  Your physician recommends that you return for lab work 1-2 weeks before your ct scan to check your BMET. You may have this done at [facility] or [facility] in [place].  You will need covid-19 testing done 3 days before your CT. We will schedule this for you once we get your appointment for your CT.  Testing/Procedures: Your physician has requested that you have cardiac CT. Cardiac computed tomography (CT) is a painless test that uses an x-ray machine to take clear, detailed pictures of your heart. For further information please visit [email or web address]. Please follow instruction sheet as given. You will be contacted from our [facility] location about this appointment.  Follow-Up:  Your physician recommends that you schedule a follow-up appointment in: 3 months  Any Other Special Instructions Will Be Listed Below (If Applicable).  Please arrive at the [facility] main entrance of [facility] at (30-45 minutes prior to test start time)  [facility] [address] [place], [place] [address] [phone]  Proceed to the [facility] Radiology Department (First Floor).  Please follow these instructions carefully (unless otherwise directed):  Hold all erectile dysfunction medications at least 48 hours prior to test.  On the Night Before the Test: Drink plenty of water. Do not consume any caffeinated/decaffeinated beverages or chocolate 12 hours prior to your test. Do not take any antihistamines 12 hours prior to your test. On the Day of the Test: Drink plenty of water. Do not drink any water within one hour of the test. Do not eat any food 4 hours prior to the test. You may take your regular medications prior to the test except glipizide. IF NOT ON A BETA BLOCKER - Take 50  mg of lopressor (metoprolol) one hour before the test. HOLD Furosemide morning of the test.  After the Test: Drink plenty of water. After receiving IV contrast, you may experience a mild flushed feeling. This is normal. On occasion, you may experience a mild rash up to 24 hours after the test. This is not dangerous. If this occurs, you can take Benadryl 25 mg and increase your fluid intake. If you experience trouble breathing, this can be serious. If it is severe call 911 IMMEDIATELY. If it is mild, please call our office. [name]

## 2019-06-17 NOTE — Addendum Note (Signed)
Addended by: Merlene Laughter on: 06/17/2019 02:57 PM   Modules accepted: Orders

## 2019-06-24 ENCOUNTER — Other Ambulatory Visit: Payer: Self-pay | Admitting: Cardiovascular Disease

## 2019-07-01 ENCOUNTER — Telehealth: Payer: Self-pay | Admitting: Cardiovascular Disease

## 2019-07-01 NOTE — Telephone Encounter (Signed)
Patient called stating that he needs to find out about upcoming labs and testing of CT. He is wanting to know about being tested for the COVID 19.

## 2019-07-02 NOTE — Telephone Encounter (Signed)
Patient informed that he does not need covid testing for his ct scan. Also informed that he would be contacted with his appointment information. Verbalized understanding.

## 2019-07-03 DIAGNOSIS — R0609 Other forms of dyspnea: Secondary | ICD-10-CM | POA: Diagnosis not present

## 2019-07-03 DIAGNOSIS — I480 Paroxysmal atrial fibrillation: Secondary | ICD-10-CM | POA: Diagnosis not present

## 2019-07-03 DIAGNOSIS — I10 Essential (primary) hypertension: Secondary | ICD-10-CM | POA: Diagnosis not present

## 2019-07-03 DIAGNOSIS — R0789 Other chest pain: Secondary | ICD-10-CM | POA: Diagnosis not present

## 2019-07-03 DIAGNOSIS — E1140 Type 2 diabetes mellitus with diabetic neuropathy, unspecified: Secondary | ICD-10-CM | POA: Diagnosis not present

## 2019-07-04 LAB — BASIC METABOLIC PANEL WITH GFR
BUN: 13 mg/dL (ref 7–25)
CO2: 29 mmol/L (ref 20–32)
Calcium: 9.2 mg/dL (ref 8.6–10.3)
Chloride: 105 mmol/L (ref 98–110)
Creat: 1.16 mg/dL (ref 0.70–1.18)
Glucose, Bld: 76 mg/dL (ref 65–139)
Potassium: 4.1 mmol/L (ref 3.5–5.3)
Sodium: 142 mmol/L (ref 135–146)

## 2019-07-06 ENCOUNTER — Other Ambulatory Visit: Payer: Self-pay

## 2019-07-06 ENCOUNTER — Ambulatory Visit (INDEPENDENT_AMBULATORY_CARE_PROVIDER_SITE_OTHER): Payer: Medicare Other | Admitting: Pharmacist Clinician (PhC)/ Clinical Pharmacy Specialist

## 2019-07-06 DIAGNOSIS — I4891 Unspecified atrial fibrillation: Secondary | ICD-10-CM

## 2019-07-06 DIAGNOSIS — Z5181 Encounter for therapeutic drug level monitoring: Secondary | ICD-10-CM | POA: Diagnosis not present

## 2019-07-06 DIAGNOSIS — I209 Angina pectoris, unspecified: Secondary | ICD-10-CM

## 2019-07-06 LAB — POCT INR: INR: 2.1 (ref 2.0–3.0)

## 2019-07-09 ENCOUNTER — Telehealth: Payer: Self-pay | Admitting: Cardiovascular Disease

## 2019-07-09 ENCOUNTER — Telehealth (HOSPITAL_COMMUNITY): Payer: Self-pay | Admitting: Emergency Medicine

## 2019-07-09 NOTE — Telephone Encounter (Signed)
Left message on voicemail with name and callback number Rena Sweeden RN Navigator Cardiac Imaging [facility] [phone] Office [phone] Cell  

## 2019-07-09 NOTE — Telephone Encounter (Signed)
Would like to know if he needs anyone to go with him for his test tomorrow

## 2019-07-09 NOTE — Telephone Encounter (Signed)
Pt aware that he would not need anyone to be with him for cardiac CT

## 2019-07-10 ENCOUNTER — Telehealth (HOSPITAL_COMMUNITY): Payer: Self-pay | Admitting: Emergency Medicine

## 2019-07-10 ENCOUNTER — Ambulatory Visit (HOSPITAL_COMMUNITY)
Admission: RE | Admit: 2019-07-10 | Discharge: 2019-07-10 | Disposition: A | Discharge status: Home / Self Care | Payer: Medicare Other | Source: Ambulatory Visit | Attending: Cardiovascular Disease | Admitting: Cardiovascular Disease

## 2019-07-10 ENCOUNTER — Other Ambulatory Visit: Payer: Self-pay

## 2019-07-10 DIAGNOSIS — I2583 Coronary atherosclerosis due to lipid rich plaque: Secondary | ICD-10-CM | POA: Diagnosis not present

## 2019-07-10 DIAGNOSIS — I209 Angina pectoris, unspecified: Secondary | ICD-10-CM

## 2019-07-10 DIAGNOSIS — I2510 Atherosclerotic heart disease of native coronary artery without angina pectoris: Secondary | ICD-10-CM | POA: Diagnosis not present

## 2019-07-10 DIAGNOSIS — R079 Chest pain, unspecified: Secondary | ICD-10-CM | POA: Diagnosis not present

## 2019-07-10 MED ORDER — NITROGLYCERIN 0.4 MG SL SUBL
SUBLINGUAL_TABLET | SUBLINGUAL | Status: AC
  Filled 2019-07-10: qty 2

## 2019-07-10 MED ORDER — NITROGLYCERIN 0.4 MG SL SUBL
0.8000 mg | SUBLINGUAL_TABLET | SUBLINGUAL | Status: DC | PRN
  Administered 2019-07-10: 16:00:00 0.8 mg via SUBLINGUAL
  Filled 2019-07-10 (×2): qty 25

## 2019-07-10 MED ORDER — IOHEXOL 350 MG/ML SOLN
80.0000 mL | Freq: Once | INTRAVENOUS | Status: AC | PRN
  Administered 2019-07-10: 80 mL via INTRAVENOUS

## 2019-07-10 NOTE — Telephone Encounter (Signed)
Pt returning phone call regarding upcoming cardiac imaging study; pt verbalizes understanding of appt date/time, parking situation and where to check in, pre-test NPO status and medications ordered, and verified current allergies; name and call back number provided for further questions should they arise Zachory Mangual RN Navigator Cardiac Imaging [facility] [phone] office [phone] cell   

## 2019-07-13 ENCOUNTER — Telehealth (INDEPENDENT_AMBULATORY_CARE_PROVIDER_SITE_OTHER): Payer: Medicare Other | Admitting: Cardiovascular Disease

## 2019-07-13 ENCOUNTER — Other Ambulatory Visit: Payer: Self-pay | Admitting: Cardiovascular Disease

## 2019-07-13 ENCOUNTER — Other Ambulatory Visit: Payer: Self-pay

## 2019-07-13 ENCOUNTER — Encounter: Payer: Self-pay | Admitting: Cardiovascular Disease

## 2019-07-13 VITALS — BP 135/76 | HR 75 | Ht 71.0 in | Wt 220.0 lb

## 2019-07-13 DIAGNOSIS — Z7901 Long term (current) use of anticoagulants: Secondary | ICD-10-CM

## 2019-07-13 DIAGNOSIS — I480 Paroxysmal atrial fibrillation: Secondary | ICD-10-CM

## 2019-07-13 DIAGNOSIS — F329 Major depressive disorder, single episode, unspecified: Secondary | ICD-10-CM

## 2019-07-13 DIAGNOSIS — I209 Angina pectoris, unspecified: Secondary | ICD-10-CM

## 2019-07-13 DIAGNOSIS — R931 Abnormal findings on diagnostic imaging of heart and coronary circulation: Secondary | ICD-10-CM

## 2019-07-13 DIAGNOSIS — F32A Depression, unspecified: Secondary | ICD-10-CM

## 2019-07-13 DIAGNOSIS — Z01818 Encounter for other preprocedural examination: Secondary | ICD-10-CM

## 2019-07-13 DIAGNOSIS — R0789 Other chest pain: Secondary | ICD-10-CM

## 2019-07-13 DIAGNOSIS — E1140 Type 2 diabetes mellitus with diabetic neuropathy, unspecified: Secondary | ICD-10-CM

## 2019-07-13 DIAGNOSIS — R0609 Other forms of dyspnea: Secondary | ICD-10-CM

## 2019-07-13 DIAGNOSIS — I10 Essential (primary) hypertension: Secondary | ICD-10-CM

## 2019-07-13 MED ORDER — SODIUM CHLORIDE 0.9% FLUSH: 3.0000 mL | Freq: Two times a day (BID) | INTRAVENOUS | Status: DC

## 2019-07-13 NOTE — Patient Instructions (Signed)
Medication Instructions:  Your physician recommends that you continue on your current medications as directed. Please refer to the Current Medication list given to you today.   Labwork: CBC  & COVID TEST   Friday   Testing/Procedures: Your physician has requested that you have a cardiac catheterization. Cardiac catheterization is used to diagnose and/or treat various heart conditions. Doctors may recommend this procedure for a number of different reasons. The most common reason is to evaluate chest pain. Chest pain can be a symptom of coronary artery disease (CAD), and cardiac catheterization can show whether plaque is narrowing or blocking your heart's arteries. This procedure is also used to evaluate the valves, as well as measure the blood flow and oxygen levels in different parts of your heart. For further information please visit [email or web address]. Please follow instruction sheet, as given.    Follow-Up: Your physician recommends that you schedule a follow-up appointment in: 1 MONTH    Any Other Special Instructions Will Be Listed Below (If Applicable).     If you need a refill on your cardiac medications before your next appointment, please call your pharmacy.

## 2019-07-13 NOTE — Addendum Note (Signed)
Addended byDebbora Lacrosse R on: 07/13/2019 02:50 PM   Modules accepted: Orders

## 2019-07-13 NOTE — H&P (View-Only) (Signed)
Virtual Visit via Telephone Note   This visit type was conducted due to national recommendations for restrictions regarding the COVID-19 Pandemic (e.g. social distancing) in an effort to limit this patient's exposure and mitigate transmission in our community.  Due to his co-morbid illnesses, this patient is at least at moderate risk for complications without adequate follow up.  This format is felt to be most appropriate for this patient at this time.  The patient did not have access to video technology/had technical difficulties with video requiring transitioning to audio format only (telephone).  All issues noted in this document were discussed and addressed.  No physical exam could be performed with this format.  Please refer to the patient's chart for his  consent to telehealth for [facility].   Date:  07/13/2019   ID:  [name], DOB 1941/10/04, MRN [id number]  Patient Location: Home Provider Location: Office  PCP:  [facility]  Cardiologist:  Kate Sable, MD  Electrophysiologist:  None   Evaluation Performed:  Follow-Up Visit  Chief Complaint: Chest tightness  History of Present Illness:    [name] is a 78 y.o. male with chest tightness and exertional dyspnea.  At his last visit I ordered a coronary CT angiogram.  Dr. Johnsie Cancel contacted me with the results.  There was a worrisome mixed plaque in the LAD with an FFR of 0.82.  Given the morphology of this plaque, cardiac catheterization was recommended.  He continues to experience exertional dyspnea and chest tightness, particularly when going up his basement stairs.  The patient does not have symptoms concerning for COVID-19 infection (fever, chills, cough).    Past Medical History:  Diagnosis Date  . Barrett's esophagus    short-segment, diagnosed in 2007 by Dr. [name]  . BPH (benign prostatic hyperplasia)   . GERD (gastroesophageal reflux disease)   . Hiatal hernia    small   . HTN (hypertension)   . PAF (paroxysmal atrial fibrillation) ([place])   . S/P colonoscopy 2007   unremarkable  . S/P endoscopy Oct 2011    Salmon-colored epithelium coming up to 37 cm from the   . Vertigo    Past Surgical History:  Procedure Laterality Date  . APPENDECTOMY    . CHOLECYSTECTOMY    . COLONOSCOPY  02/06/2006   Dr. [name]- single anal papilla, o/w normal rectum, normal colon  . COLONOSCOPY N/A 09/13/2016   Procedure: COLONOSCOPY;  Surgeon: [name], MD;  [facility];  Service: Endoscopy;  Laterality: N/A;  145   . ESOPHAGOGASTRODUODENOSCOPY  09/15/2010   Dr. [name]- Salmon-colored epithelium coming up to 37 cm from the   . ESOPHAGOGASTRODUODENOSCOPY N/A 07/02/2013   Dr. [name]- Barretts on bx. hiatal hernia  . ESOPHAGOGASTRODUODENOSCOPY N/A 09/13/2016   Procedure: ESOPHAGOGASTRODUODENOSCOPY (EGD);  Surgeon: [name], MD;  [facility];  Service: Endoscopy;  Laterality: N/A;  . ROTATOR CUFF REPAIR    . YAG LASER APPLICATION Right [id number]   Procedure: YAG LASER APPLICATION;  Surgeon: [name], MD;  Location: [facility] ORS;  Service: Ophthalmology;  Laterality: Right;     Current Meds  Medication Sig  . allopurinol (ZYLOPRIM) 100 MG tablet Take 1 tablet by mouth daily.  [name] ALPRAZolam (XANAX) 1 MG tablet Take 0.5 mg by mouth at bedtime as needed for anxiety.  . DULoxetine (CYMBALTA) 30 MG capsule Take 30 mg by mouth daily.  [name] escitalopram (LEXAPRO) 10 MG tablet Take 10 mg by mouth daily.  [name]  fexofenadine (ALLEGRA) 180 MG tablet Take 180 mg by mouth every morning.  . gabapentin (NEURONTIN) 300 MG capsule Take 300 mg by mouth 2 (two) times daily.   [name] glipiZIDE (GLUCOTROL) 5 MG tablet Take 5 mg by mouth daily before breakfast.   . metoprolol tartrate (LOPRESSOR) 50 MG tablet Take 1 tablet (50 mg total) by mouth once for 1 dose. One hour before your CT  . pantoprazole (PROTONIX) 40 MG tablet TAKE ONE TABLET BY MOUTH TWICE A DAY  . Tamsulosin  HCl (FLOMAX) 0.4 MG CAPS Take 0.4 mg by mouth 2 (two) times daily.   [name] warfarin (COUMADIN) 5 MG tablet TAKE ONE TABLET BY MOUTH DAILY EXCEPT ONE-HALF TABLET ON TUESDAYS ANDFRIDAYS OR AS DIRECTED     Allergies:   Patient has no known allergies.   Social History   Tobacco Use  . Smoking status: Former Smoker    Packs/day: 1.50    Years: 30.00    Pack years: 45.00    Types: Cigarettes    Quit date: 11/18/1993    Years since quitting: 25.6  . Smokeless tobacco: Never Used  . Tobacco comment: quit in the 90's  Substance Use Topics  . Alcohol use: No  . Drug use: No     Family Hx: The patient's family history includes Prostate cancer in his father; Stroke in his mother. There is no history of Colon cancer.  ROS:   Please see the history of present illness.     All other systems reviewed and are negative.   Prior CV studies:   The following studies were reviewed today:  Coronary CT angiography 07/10/2019:  FINDINGS: Non-cardiac: See separate report from [facility] Radiology. No significant findings on limited lung and soft tissue windows.  Calcium Score: Calcium noted in LM, LAD and RCA  Coronary Arteries: Right dominant with no anomalies  LM: 1-24% calcified plaque  LAD: 25-49% mixed plaque in proximal LAD 1-24% calcified plaque in mid LAD  D1: Normal  D2: Normal  Circumflex: Normal  OM1: Normal  OM2: Normal  RCA: Diffusely small proximal vessel with markedly tortuous segment 25-49% soft plaque, 1-24% calcified plaque in mid vessel  PDA: Normal  PLA: Normal  IMPRESSION: 1.  Calcium score 198 which is 42 nd percentile for age and sex  2.  Normal aortic root 3.7 cm  3.  Area of calcified pericardium near apex  4.  Dilated RPA 3.2 cm  5. Morphologically worrisome mixed plaque in proximal LAD and soft plaque in RCA see description above. Study sent for FFR CT. Despite low HR appears to be significant respiratory artifact If study   Rejected for FFR CT would proceed with cath  Labs/Other Tests and Data Reviewed:    EKG:  No ECG reviewed.  Recent Labs: 03/27/2019: ALT 20; Hemoglobin 14.7; Platelets 178 07/03/2019: BUN 13; Creat 1.16; Potassium 4.1; Sodium 142   Recent Lipid Panel No results found for: CHOL, TRIG, HDL, CHOLHDL, LDLCALC, LDLDIRECT  Wt Readings from Last 3 Encounters:  07/13/19 220 lb (99.8 kg)  06/17/19 220 lb (99.8 kg)  03/27/19 220 lb (99.8 kg)     Objective:    Vital Signs:  BP 135/76   Pulse 75   Ht 5\' 11"  (1.803 m)   Wt 220 lb (99.8 kg)   SpO2 98%   BMI 30.68 kg/m    VITAL SIGNS:  reviewed  ASSESSMENT & PLAN:    1.  Chest tightness and exertional dyspnea: Cardiac CT reviewed.  There was  a worrisome mixed plaque in the LAD with an FFR of 0.82.  Given the morphology of this plaque, cardiac catheterization was recommended. I will arrange for left heart catheterization and coronary angiography. Risks and benefits of cardiac catheterization have been discussed with the patient.  These include bleeding, infection, kidney damage, stroke, heart attack, death.  The patient understands these risks and is willing to proceed. He is on a beta-blocker and warfarin.  Statin therapy is indicated given his concomitant history of type 2 diabetes mellitus.  I will start rosuvastatin 20 mg.  2. Paroxysmal atrial fibrillation: Toprol-XL led to bradycardia and fatigue even at low doses so this was ultimately discontinued.Anticoagulated with warfarin. INR 2.7 on 06/08/2019.  3. Hypertension: Blood pressure is normal.  4. Type 2 diabetes mellitus: Diagnosed in the fall 2019. Currently on glipizide.  5. Depression: Currently on Lexapro 10 mg. This appears to be situational and related to his wife's failing health.     COVID-19 Education: The signs and symptoms of COVID-19 were discussed with the patient and how to seek care for testing (follow up with PCP or arrange E-visit).  The importance  of social distancing was discussed today.  Time:   Today, I have spent 25 minutes with the patient with telehealth technology discussing the above problems.     Medication Adjustments/Labs and Tests Ordered: Current medicines are reviewed at length with the patient today.  Concerns regarding medicines are outlined above.   Tests Ordered: No orders of the defined types were placed in this encounter.   Medication Changes: No orders of the defined types were placed in this encounter.   Follow Up:  Virtual Visit or In Person in 1 month(s) after cath  Signed, Kate Sable, MD  07/13/2019 12:53 PM    [place]

## 2019-07-13 NOTE — Progress Notes (Signed)
Virtual Visit via Telephone Note   This visit type was conducted due to national recommendations for restrictions regarding the COVID-19 Pandemic (e.g. social distancing) in an effort to limit this patient's exposure and mitigate transmission in our community.  Due to his co-morbid illnesses, this patient is at least at moderate risk for complications without adequate follow up.  This format is felt to be most appropriate for this patient at this time.  The patient did not have access to video technology/had technical difficulties with video requiring transitioning to audio format only (telephone).  All issues noted in this document were discussed and addressed.  No physical exam could be performed with this format.  Please refer to the patient's chart for his  consent to telehealth for [facility].   Date:  07/13/2019   ID:  [name], DOB [birth year], MRN [id number]  Patient Location: Home Provider Location: Office  PCP:  [facility]  Cardiologist:  Kate Sable, MD  Electrophysiologist:  None   Evaluation Performed:  Follow-Up Visit  Chief Complaint: Chest tightness  History of Present Illness:    [name] is a 78 y.o. male with chest tightness and exertional dyspnea.  At his last visit I ordered a coronary CT angiogram.  Dr. Johnsie Cancel contacted me with the results.  There was a worrisome mixed plaque in the LAD with an FFR of 0.82.  Given the morphology of this plaque, cardiac catheterization was recommended.  He continues to experience exertional dyspnea and chest tightness, particularly when going up his basement stairs.  The patient does not have symptoms concerning for COVID-19 infection (fever, chills, cough).    Past Medical History:  Diagnosis Date  . Barrett's esophagus    short-segment, diagnosed in 2007 by Dr. [name]  . BPH (benign prostatic hyperplasia)   . GERD (gastroesophageal reflux disease)   . Hiatal hernia    small   . HTN (hypertension)   . PAF (paroxysmal atrial fibrillation) ([place])   . S/P colonoscopy 2007   unremarkable  . S/P endoscopy Oct 2011    Salmon-colored epithelium coming up to 37 cm from the   . Vertigo    Past Surgical History:  Procedure Laterality Date  . APPENDECTOMY    . CHOLECYSTECTOMY    . COLONOSCOPY  02/06/2006   Dr. [name]- single anal papilla, o/w normal rectum, normal colon  . COLONOSCOPY N/A 09/13/2016   Procedure: COLONOSCOPY;  Surgeon: [name], MD;  Location: [facility] ENDO [facility];  Service: Endoscopy;  Laterality: N/A;  145   . ESOPHAGOGASTRODUODENOSCOPY  09/15/2010   Dr. [name]- Salmon-colored epithelium coming up to 37 cm from the   . ESOPHAGOGASTRODUODENOSCOPY N/A 07/02/2013   Dr. [name]- Barretts on bx. hiatal hernia  . ESOPHAGOGASTRODUODENOSCOPY N/A 09/13/2016   Procedure: ESOPHAGOGASTRODUODENOSCOPY (EGD);  Surgeon: [name], MD;  Location: [facility] END[facility];  Service: Endoscopy;  Laterality: N/A;  . ROTATOR CUFF REPAIR    . YAG LASER APPLICATION Right [id number]   Procedure: YAG LASER APPLICATION;  Surgeon: [name], MD;  Location: [facility] ORS;  Service: Ophthalmology;  Laterality: Right;     Current Meds  Medication Sig  . allopurinol (ZYLOPRIM) 100 MG tablet Take 1 tablet by mouth daily.  [name] ALPRAZolam (XANAX) 1 MG tablet Take 0.5 mg by mouth at bedtime as needed for anxiety.  . DULoxetine (CYMBALTA) 30 MG capsule Take 30 mg by mouth daily.  [name] escitalopram (LEXAPRO) 10 MG tablet Take 10 mg by mouth daily.  [name]  fexofenadine (ALLEGRA) 180 MG tablet Take 180 mg by mouth every morning.  . gabapentin (NEURONTIN) 300 MG capsule Take 300 mg by mouth 2 (two) times daily.   [name] glipiZIDE (GLUCOTROL) 5 MG tablet Take 5 mg by mouth daily before breakfast.   . metoprolol tartrate (LOPRESSOR) 50 MG tablet Take 1 tablet (50 mg total) by mouth once for 1 dose. One hour before your CT  . pantoprazole (PROTONIX) 40 MG tablet TAKE ONE TABLET BY MOUTH TWICE A DAY  . Tamsulosin  HCl (FLOMAX) 0.4 MG CAPS Take 0.4 mg by mouth 2 (two) times daily.   [name] warfarin (COUMADIN) 5 MG tablet TAKE ONE TABLET BY MOUTH DAILY EXCEPT ONE-HALF TABLET ON TUESDAYS ANDFRIDAYS OR AS DIRECTED     Allergies:   Patient has no known allergies.   Social History   Tobacco Use  . Smoking status: Former Smoker    Packs/day: 1.50    Years: 30.00    Pack years: 45.00    Types: Cigarettes    Quit date: 11/18/1993    Years since quitting: 25.6  . Smokeless tobacco: Never Used  . Tobacco comment: quit in the 90's  Substance Use Topics  . Alcohol use: No  . Drug use: No     Family Hx: The patient's family history includes Prostate cancer in his father; Stroke in his mother. There is no history of Colon cancer.  ROS:   Please see the history of present illness.     All other systems reviewed and are negative.   Prior CV studies:   The following studies were reviewed today:  Coronary CT angiography 07/10/2019:  FINDINGS: Non-cardiac: See separate report from [facility]. No significant findings on limited lung and soft tissue windows.  Calcium Score: Calcium noted in LM, LAD and RCA  Coronary Arteries: Right dominant with no anomalies  LM: 1-24% calcified plaque  LAD: 25-49% mixed plaque in proximal LAD 1-24% calcified plaque in mid LAD  D1: Normal  D2: Normal  Circumflex: Normal  OM1: Normal  OM2: Normal  RCA: Diffusely small proximal vessel with markedly tortuous segment 25-49% soft plaque, 1-24% calcified plaque in mid vessel  PDA: Normal  PLA: Normal  IMPRESSION: 1.  Calcium score 198 which is 42 nd percentile for age and sex  2.  Normal aortic root 3.7 cm  3.  Area of calcified pericardium near apex  4.  Dilated RPA 3.2 cm  5. Morphologically worrisome mixed plaque in proximal LAD and soft plaque in RCA see description above. Study sent for FFR CT. Despite low HR appears to be significant respiratory artifact If study   Rejected for FFR CT would proceed with cath  Labs/Other Tests and Data Reviewed:    EKG:  No ECG reviewed.  Recent Labs: 03/27/2019: ALT 20; Hemoglobin 14.7; Platelets 178 07/03/2019: BUN 13; Creat 1.16; Potassium 4.1; Sodium 142   Recent Lipid Panel No results found for: CHOL, TRIG, HDL, CHOLHDL, LDLCALC, LDLDIRECT  Wt Readings from Last 3 Encounters:  07/13/19 220 lb (99.8 kg)  06/17/19 220 lb (99.8 kg)  03/27/19 220 lb (99.8 kg)     Objective:    Vital Signs:  BP 135/76   Pulse 75   Ht 5\' 11"  (1.803 m)   Wt 220 lb (99.8 kg)   SpO2 98%   BMI 30.68 kg/m    VITAL SIGNS:  reviewed  ASSESSMENT & PLAN:    1.  Chest tightness and exertional dyspnea: Cardiac CT reviewed.  There was  a worrisome mixed plaque in the LAD with an FFR of 0.82.  Given the morphology of this plaque, cardiac catheterization was recommended. I will arrange for left heart catheterization and coronary angiography. Risks and benefits of cardiac catheterization have been discussed with the patient.  These include bleeding, infection, kidney damage, stroke, heart attack, death.  The patient understands these risks and is willing to proceed. He is on a beta-blocker and warfarin.  Statin therapy is indicated given his concomitant history of type 2 diabetes mellitus.  I will start rosuvastatin 20 mg.  2. Paroxysmal atrial fibrillation: Toprol-XL led to bradycardia and fatigue even at low doses so this was ultimately discontinued.Anticoagulated with warfarin. INR 2.7 on 06/08/2019.  3. Hypertension: Blood pressure is normal.  4. Type 2 diabetes mellitus: Diagnosed in the fall 2019. Currently on glipizide.  5. Depression: Currently on Lexapro 10 mg. This appears to be situational and related to his wife's failing health.     COVID-19 Education: The signs and symptoms of COVID-19 were discussed with the patient and how to seek care for testing (follow up with PCP or arrange E-visit).  The importance  of social distancing was discussed today.  Time:   Today, I have spent 25 minutes with the patient with telehealth technology discussing the above problems.     Medication Adjustments/Labs and Tests Ordered: Current medicines are reviewed at length with the patient today.  Concerns regarding medicines are outlined above.   Tests Ordered: No orders of the defined types were placed in this encounter.   Medication Changes: No orders of the defined types were placed in this encounter.   Follow Up:  Virtual Visit or In Person in 1 month(s) after cath  Signed, Kate Sable, MD  07/13/2019 12:53 PM    [place]

## 2019-07-17 ENCOUNTER — Other Ambulatory Visit: Payer: Self-pay

## 2019-07-17 ENCOUNTER — Other Ambulatory Visit (HOSPITAL_COMMUNITY)
Admission: RE | Admit: 2019-07-17 | Discharge: 2019-07-17 | Disposition: A | Discharge status: Home / Self Care | Payer: Medicare Other | Source: Ambulatory Visit | Attending: Interventional Cardiology | Admitting: Interventional Cardiology

## 2019-07-17 ENCOUNTER — Other Ambulatory Visit (HOSPITAL_COMMUNITY)
Admission: RE | Admit: 2019-07-17 | Discharge: 2019-07-17 | Disposition: A | Discharge status: Home / Self Care | Payer: Medicare Other | Source: Ambulatory Visit | Attending: Cardiovascular Disease | Admitting: Cardiovascular Disease

## 2019-07-17 ENCOUNTER — Other Ambulatory Visit (HOSPITAL_COMMUNITY): Payer: Medicare Other

## 2019-07-17 DIAGNOSIS — Z20828 Contact with and (suspected) exposure to other viral communicable diseases: Secondary | ICD-10-CM | POA: Insufficient documentation

## 2019-07-17 DIAGNOSIS — Z01812 Encounter for preprocedural laboratory examination: Secondary | ICD-10-CM | POA: Diagnosis not present

## 2019-07-17 LAB — CBC WITH DIFFERENTIAL/PLATELET
Abs Immature Granulocytes: 0.02 10*3/uL (ref 0.00–0.07)
Basophils Absolute: 0.1 10*3/uL (ref 0.0–0.1)
Basophils Relative: 1 %
Eosinophils Absolute: 0.3 10*3/uL (ref 0.0–0.5)
Eosinophils Relative: 6 %
HCT: 40 % (ref 39.0–52.0)
Hemoglobin: 13.4 g/dL (ref 13.0–17.0)
Immature Granulocytes: 0 %
Lymphocytes Relative: 17 %
Lymphs Abs: 0.8 10*3/uL (ref 0.7–4.0)
MCH: 31.5 pg (ref 26.0–34.0)
MCHC: 33.5 g/dL (ref 30.0–36.0)
MCV: 94.1 fL (ref 80.0–100.0)
Monocytes Absolute: 0.5 10*3/uL (ref 0.1–1.0)
Monocytes Relative: 9 %
Neutro Abs: 3.4 10*3/uL (ref 1.7–7.7)
Neutrophils Relative %: 67 %
Platelets: 154 10*3/uL (ref 150–400)
RBC: 4.25 MIL/uL (ref 4.22–5.81)
RDW: 14.3 % (ref 11.5–15.5)
WBC: 5 10*3/uL (ref 4.0–10.5)
nRBC: 0 % (ref 0.0–0.2)

## 2019-07-17 LAB — SARS CORONAVIRUS 2 (TAT 6-24 HRS): SARS Coronavirus 2: NEGATIVE

## 2019-07-20 ENCOUNTER — Telehealth: Payer: Self-pay | Admitting: *Deleted

## 2019-07-20 NOTE — H&P (Signed)
CT FFR with prox LAD 50% stenosis and FFR .82. Has chest tightness and DOE.

## 2019-07-20 NOTE — Telephone Encounter (Signed)
Pt contacted pre-catheterization scheduled at [facility] for: Tuesday July 21, 2019 7:30 AM Verified arrival time and place: [facility]) at: 5:30 AM   No solid food after midnight prior to cath, clear liquids until 5 AM day of procedure. Contrast allergy: no   Hold: Glipizide-AM of procedure.   Coumadin-pt was instructed to hold starting 07/17/19 until post procedure. Pt states he mistakenly took 1/2 of a 5 mg coumadin about 10 PM 07/17/19-he has not taken any since and knows not to take anymore until post procedure.   Except hold medications AM meds can be taken pre-cath with sip of water including: ASA 81 mg   Confirmed patient has responsible person to drive home post procedure and observe 24 hours after arriving home: yes  Currently, due to Covid-19 pandemic, only one support person will be allowed with patient. Must be the same support person for that patient's entire stay, will be screened and required to wear a mask. They will be asked to wait in the waiting room for the duration of the patient's stay.  Patients are required to wear a mask when they enter the hospital.      COVID-19 Pre-Screening Questions:  . In the past 7 to 10 days have you had a cough,  shortness of breath, headache, congestion, fever (100 or greater) body aches, chills, sore throat, or sudden loss of taste or sense of smell? Headache/congestion . Have you been around anyone with known Covid 19? no . Have you been around anyone who is awaiting Covid 19 test results in the past 7 to 10 days? no . Have you been around anyone who has been exposed to Covid 19, or has mentioned symptoms of Covid 19 within the past 7 to 10 days? no  Pt states yesterday he had several episodes of feeling his head spinning like he was going to pass out, very short lasting, denies chest pain/tightness/shortness of breath, BP okay. Pt states he also has headache/congestion in the last few  days, denies fever or history of sinus problems. Pt states he does have a history of vertigo but not recently. Pt advised to call 911, not to drive, if he has recurrent symptoms. Pt verbalized understanding.  I reviewed procedure/mask/visitor, Covid-19 screening questions with patient, he verbalized understanding.   I will forward to Dr Tamala Julian and Dr Bronson Ing for review.

## 2019-07-21 ENCOUNTER — Encounter (HOSPITAL_COMMUNITY)
Admission: RE | Disposition: A | Discharge status: Home / Self Care | Payer: Medicare Other | Source: Home / Self Care | Attending: Interventional Cardiology

## 2019-07-21 ENCOUNTER — Ambulatory Visit (HOSPITAL_COMMUNITY)
Admission: RE | Admit: 2019-07-21 | Discharge: 2019-07-21 | Disposition: A | Discharge status: Home / Self Care | Payer: Medicare Other | Attending: Interventional Cardiology | Admitting: Interventional Cardiology

## 2019-07-21 ENCOUNTER — Other Ambulatory Visit: Payer: Self-pay

## 2019-07-21 ENCOUNTER — Encounter (HOSPITAL_COMMUNITY): Payer: Self-pay | Admitting: Interventional Cardiology

## 2019-07-21 DIAGNOSIS — Z7901 Long term (current) use of anticoagulants: Secondary | ICD-10-CM | POA: Diagnosis not present

## 2019-07-21 DIAGNOSIS — Z87891 Personal history of nicotine dependence: Secondary | ICD-10-CM | POA: Diagnosis not present

## 2019-07-21 DIAGNOSIS — R931 Abnormal findings on diagnostic imaging of heart and coronary circulation: Secondary | ICD-10-CM | POA: Diagnosis not present

## 2019-07-21 DIAGNOSIS — Z7984 Long term (current) use of oral hypoglycemic drugs: Secondary | ICD-10-CM | POA: Diagnosis not present

## 2019-07-21 DIAGNOSIS — N400 Enlarged prostate without lower urinary tract symptoms: Secondary | ICD-10-CM | POA: Insufficient documentation

## 2019-07-21 DIAGNOSIS — I480 Paroxysmal atrial fibrillation: Secondary | ICD-10-CM | POA: Diagnosis not present

## 2019-07-21 DIAGNOSIS — K219 Gastro-esophageal reflux disease without esophagitis: Secondary | ICD-10-CM | POA: Diagnosis not present

## 2019-07-21 DIAGNOSIS — F329 Major depressive disorder, single episode, unspecified: Secondary | ICD-10-CM | POA: Diagnosis not present

## 2019-07-21 DIAGNOSIS — Z79899 Other long term (current) drug therapy: Secondary | ICD-10-CM | POA: Diagnosis not present

## 2019-07-21 DIAGNOSIS — I209 Angina pectoris, unspecified: Secondary | ICD-10-CM

## 2019-07-21 DIAGNOSIS — I10 Essential (primary) hypertension: Secondary | ICD-10-CM | POA: Insufficient documentation

## 2019-07-21 DIAGNOSIS — I2510 Atherosclerotic heart disease of native coronary artery without angina pectoris: Secondary | ICD-10-CM | POA: Diagnosis not present

## 2019-07-21 DIAGNOSIS — R0789 Other chest pain: Secondary | ICD-10-CM | POA: Diagnosis present

## 2019-07-21 DIAGNOSIS — K2270 Barrett's esophagus without dysplasia: Secondary | ICD-10-CM | POA: Diagnosis present

## 2019-07-21 DIAGNOSIS — E119 Type 2 diabetes mellitus without complications: Secondary | ICD-10-CM | POA: Insufficient documentation

## 2019-07-21 DIAGNOSIS — R0609 Other forms of dyspnea: Secondary | ICD-10-CM | POA: Diagnosis present

## 2019-07-21 HISTORY — PX: CORONARY PRESSURE/FFR STUDY: CATH118243

## 2019-07-21 HISTORY — PX: LEFT HEART CATH AND CORONARY ANGIOGRAPHY: CATH118249

## 2019-07-21 LAB — GLUCOSE, CAPILLARY
Glucose-Capillary: 145 mg/dL — ABNORMAL HIGH (ref 70–99)
Glucose-Capillary: 139 mg/dL — ABNORMAL HIGH (ref 70–99)

## 2019-07-21 LAB — PROTIME-INR
INR: 1.4 — ABNORMAL HIGH (ref 0.8–1.2)
Prothrombin Time: 17.4 seconds — ABNORMAL HIGH (ref 11.4–15.2)

## 2019-07-21 LAB — BASIC METABOLIC PANEL
Anion gap: 8 (ref 5–15)
BUN: 11 mg/dL (ref 8–23)
CO2: 24 mmol/L (ref 22–32)
Calcium: 9.2 mg/dL (ref 8.9–10.3)
Chloride: 110 mmol/L (ref 98–111)
Creatinine, Ser: 1.28 mg/dL — ABNORMAL HIGH (ref 0.61–1.24)
GFR calc Af Amer: >60 mL/min (ref >60)
GFR calc non Af Amer: 54 mL/min — ABNORMAL LOW (ref >60)
Glucose, Bld: 157 mg/dL — ABNORMAL HIGH (ref 70–99)
Potassium: 3.7 mmol/L (ref 3.5–5.1)
Sodium: 142 mmol/L (ref 135–145)

## 2019-07-21 LAB — POCT ACTIVATED CLOTTING TIME: Activated Clotting Time: 235 seconds

## 2019-07-21 SURGERY — LEFT HEART CATH AND CORONARY ANGIOGRAPHY
Anesthesia: LOCAL

## 2019-07-21 MED ORDER — SODIUM CHLORIDE 0.9 % WEIGHT BASED INFUSION
3.0000 mL/kg/h | INTRAVENOUS | Status: AC
  Administered 2019-07-21: 06:00:00 3 mL/kg/h via INTRAVENOUS

## 2019-07-21 MED ORDER — LIDOCAINE HCL (PF) 1 % IJ SOLN
INTRAMUSCULAR | Status: AC
  Filled 2019-07-21: qty 30

## 2019-07-21 MED ORDER — VERAPAMIL HCL 2.5 MG/ML IV SOLN
INTRAVENOUS | Status: DC | PRN
  Administered 2019-07-21: 08:00:00 10 mL via INTRA_ARTERIAL

## 2019-07-21 MED ORDER — MIDAZOLAM HCL 2 MG/2ML IJ SOLN
INTRAMUSCULAR | Status: AC
  Filled 2019-07-21: qty 2

## 2019-07-21 MED ORDER — SODIUM CHLORIDE 0.9% FLUSH: 3.0000 mL | Freq: Two times a day (BID) | INTRAVENOUS | Status: DC

## 2019-07-21 MED ORDER — NITROGLYCERIN 1 MG/10 ML FOR IR/CATH LAB
INTRA_ARTERIAL | Status: AC
  Filled 2019-07-21: qty 10

## 2019-07-21 MED ORDER — IOHEXOL 350 MG/ML SOLN
INTRAVENOUS | Status: DC | PRN
  Administered 2019-07-21: 140 mL via INTRACARDIAC

## 2019-07-21 MED ORDER — LABETALOL HCL 5 MG/ML IV SOLN: 10.0000 mg | INTRAVENOUS | Status: DC | PRN

## 2019-07-21 MED ORDER — HEPARIN SODIUM (PORCINE) 1000 UNIT/ML IJ SOLN
INTRAMUSCULAR | Status: DC | PRN
  Administered 2019-07-21: 5000 [IU] via INTRAVENOUS
  Administered 2019-07-21: 3000 [IU] via INTRAVENOUS

## 2019-07-21 MED ORDER — SODIUM CHLORIDE 0.9 % IV SOLN: INTRAVENOUS | Status: DC

## 2019-07-21 MED ORDER — NITROGLYCERIN 1 MG/10 ML FOR IR/CATH LAB
INTRA_ARTERIAL | Status: DC | PRN
  Administered 2019-07-21: 200 ug via INTRACORONARY

## 2019-07-21 MED ORDER — LIDOCAINE HCL (PF) 1 % IJ SOLN
INTRAMUSCULAR | Status: DC | PRN
  Administered 2019-07-21: 2 mL

## 2019-07-21 MED ORDER — SODIUM CHLORIDE 0.9 % WEIGHT BASED INFUSION: 1.0000 mL/kg/h | INTRAVENOUS | Status: DC

## 2019-07-21 MED ORDER — HEPARIN SODIUM (PORCINE) 1000 UNIT/ML IJ SOLN
INTRAMUSCULAR | Status: AC
  Filled 2019-07-21: qty 1

## 2019-07-21 MED ORDER — HEPARIN (PORCINE) IN NACL 1000-0.9 UT/500ML-% IV SOLN
INTRAVENOUS | Status: DC | PRN
  Administered 2019-07-21 (×2): 500 mL

## 2019-07-21 MED ORDER — OXYCODONE HCL 5 MG PO TABS: 5.0000 mg | ORAL_TABLET | ORAL | Status: DC | PRN

## 2019-07-21 MED ORDER — ACETAMINOPHEN 325 MG PO TABS: 650.0000 mg | ORAL_TABLET | ORAL | Status: DC | PRN

## 2019-07-21 MED ORDER — SODIUM CHLORIDE 0.9 % IV SOLN: 250.0000 mL | INTRAVENOUS | Status: DC | PRN

## 2019-07-21 MED ORDER — ONDANSETRON HCL 4 MG/2ML IJ SOLN: 4.0000 mg | Freq: Four times a day (QID) | INTRAMUSCULAR | Status: DC | PRN

## 2019-07-21 MED ORDER — FENTANYL CITRATE (PF) 100 MCG/2ML IJ SOLN
INTRAMUSCULAR | Status: DC | PRN
  Administered 2019-07-21: 25 ug via INTRAVENOUS

## 2019-07-21 MED ORDER — MIDAZOLAM HCL 2 MG/2ML IJ SOLN
INTRAMUSCULAR | Status: DC | PRN
  Administered 2019-07-21: 1 mg via INTRAVENOUS

## 2019-07-21 MED ORDER — HEPARIN (PORCINE) IN NACL 1000-0.9 UT/500ML-% IV SOLN
INTRAVENOUS | Status: AC
  Filled 2019-07-21: qty 1000

## 2019-07-21 MED ORDER — VERAPAMIL HCL 2.5 MG/ML IV SOLN
INTRAVENOUS | Status: AC
  Filled 2019-07-21: qty 2

## 2019-07-21 MED ORDER — SODIUM CHLORIDE 0.9% FLUSH: 3.0000 mL | INTRAVENOUS | Status: DC | PRN

## 2019-07-21 MED ORDER — HYDRALAZINE HCL 20 MG/ML IJ SOLN: 10.0000 mg | INTRAMUSCULAR | Status: DC | PRN

## 2019-07-21 MED ORDER — FENTANYL CITRATE (PF) 100 MCG/2ML IJ SOLN
INTRAMUSCULAR | Status: AC
  Filled 2019-07-21: qty 2

## 2019-07-21 MED ORDER — ASPIRIN 81 MG PO CHEW: 81.0000 mg | CHEWABLE_TABLET | ORAL | Status: DC

## 2019-07-21 SURGICAL SUPPLY — 15 items
CATH INFINITI 5 FR JL3.5 (CATHETERS) ×2 IMPLANT
CATH INFINITI JR4 5F (CATHETERS) ×2 IMPLANT
CATH LAUNCHER 5F EBU3.5 (CATHETERS) ×2 IMPLANT
CATH LAUNCHER 6FR EBU3.5 (CATHETERS) ×2 IMPLANT
DEVICE RAD COMP TR BAND LRG (VASCULAR PRODUCTS) ×2 IMPLANT
GLIDESHEATH SLEND A-KIT 6F 22G (SHEATH) ×2 IMPLANT
GUIDEWIRE INQWIRE 1.5J.035X260 (WIRE) ×1 IMPLANT
GUIDEWIRE PRESSURE COMET II (WIRE) ×2 IMPLANT
INQWIRE 1.5J .035X260CM (WIRE) ×2
KIT ESSENTIALS PG (KITS) ×2 IMPLANT
KIT HEART LEFT (KITS) ×2 IMPLANT
PACK CARDIAC CATHETERIZATION (CUSTOM PROCEDURE TRAY) ×2 IMPLANT
SHEATH PROBE COVER 6X72 (BAG) ×2 IMPLANT
TRANSDUCER W/STOPCOCK (MISCELLANEOUS) ×2 IMPLANT
TUBING CIL FLEX 10 FLL-RA (TUBING) ×2 IMPLANT

## 2019-07-21 NOTE — CV Procedure (Signed)
   Patient with extreme fatigue and dyspnea on exertion.  No real chest discomfort.  Recent abnormal coronary CT with complex plaque in proximal LAD but 50% stenosis and negative FFR.  Referred for cath because of complexity of plaque noted on CT.  Left heart cath with coronary angiography and DFR via right radial using real-time vascular ultrasound for guidance.  Proximal LAD before a large diagonal contains 40 to 60% hypodense stenosis.  DFR 0.94.  FFR on CT 0.83.  Tortuous proximal RCA with 30 to 50% narrowing  Widely patent left main.  Normal circumflex.  The proximal LAD lesion is not hemodynamically significant.  Aggressive preventive therapy is recommended.

## 2019-07-21 NOTE — Discharge Instructions (Signed)
Radial Site Care ° °This sheet gives you information about how to care for yourself after your procedure. Your health care provider may also give you more specific instructions. If you have problems or questions, contact your health care provider. °What can I expect after the procedure? °After the procedure, it is common to have: °· Bruising and tenderness at the catheter insertion area. °Follow these instructions at home: °Medicines °· Take over-the-counter and prescription medicines only as told by your health care provider. °Insertion site care °· Follow instructions from your health care provider about how to take care of your insertion site. Make sure you: °? Wash your hands with soap and water before you change your bandage (dressing). If soap and water are not available, use hand sanitizer. °? Change your dressing as told by your health care provider. °? Leave stitches (sutures), skin glue, or adhesive strips in place. These skin closures may need to stay in place for 2 weeks or longer. If adhesive strip edges start to loosen and curl up, you may trim the loose edges. Do not remove adhesive strips completely unless your health care provider tells you to do that. °· Check your insertion site every day for signs of infection. Check for: °? Redness, swelling, or pain. °? Fluid or blood. °? Pus or a bad smell. °? Warmth. °· Do not take baths, swim, or use a hot tub until your health care provider approves. °· You may shower 24-48 hours after the procedure, or as directed by your health care provider. °? Remove the dressing and gently wash the site with plain soap and water. °? Pat the area dry with a clean towel. °? Do not rub the site. That could cause bleeding. °· Do not apply powder or lotion to the site. °Activity ° °· For 24 hours after the procedure, or as directed by your health care provider: °? Do not flex or bend the affected arm. °? Do not push or pull heavy objects with the affected arm. °? Do not  drive yourself home from the hospital or clinic. You may drive 24 hours after the procedure unless your health care provider tells you not to. °? Do not operate machinery or power tools. °· Do not lift anything that is heavier than 10 lb (4.5 kg), or the limit that you are told, until your health care provider says that it is safe. °· Ask your health care provider when it is okay to: °? Return to work or school. °? Resume usual physical activities or sports. °? Resume sexual activity. °General instructions °· If the catheter site starts to bleed, raise your arm and put firm pressure on the site. If the bleeding does not stop, get help right away. This is a medical emergency. °· If you went home on the same day as your procedure, a responsible adult should be with you for the first 24 hours after you arrive home. °· Keep all follow-up visits as told by your health care provider. This is important. °Contact a health care provider if: °· You have a fever. °· You have redness, swelling, or yellow drainage around your insertion site. °Get help right away if: °· You have unusual pain at the radial site. °· The catheter insertion area swells very fast. °· The insertion area is bleeding, and the bleeding does not stop when you hold steady pressure on the area. °· Your arm or hand becomes pale, cool, tingly, or numb. °These symptoms may represent a serious problem   that is an emergency. Do not wait to see if the symptoms will go away. Get medical help right away. Call your local emergency services (911 in the [place] Do not drive yourself to the hospital. °Summary °· After the procedure, it is common to have bruising and tenderness at the site. °· Follow instructions from your health care provider about how to take care of your radial site wound. Check the wound every day for signs of infection. °· Do not lift anything that is heavier than 10 lb (4.5 kg), or the limit that you are told, until your health care provider says  that it is safe. °This information is not intended to replace advice given to you by your health care provider. Make sure you discuss any questions you have with your health care provider. °Document Released: 12/08/2010 Document Revised: 12/11/2017 Document Reviewed: 12/11/2017 °Elsevier Patient Education © 2020 [employer] ° °

## 2019-07-21 NOTE — Interval H&P Note (Signed)
Cath Lab Visit (complete for each Cath Lab visit)  Clinical Evaluation Leading to the Procedure:   ACS: No.  Non-ACS:    Anginal Classification: CCS III  Anti-ischemic medical therapy: Minimal Therapy (1 class of medications)  Non-Invasive Test Results: No non-invasive testing performed  Prior CABG: No previous CABG      History and Physical Interval Note:  07/21/2019 7:29 AM  [name]  has presented today for surgery, with the diagnosis of abnormal ct - chest pain.  The various methods of treatment have been discussed with the patient and family. After consideration of risks, benefits and other options for treatment, the patient has consented to  Procedure(s): LEFT HEART CATH AND CORONARY ANGIOGRAPHY (N/A) as a surgical intervention.  The patient's history has been reviewed, patient examined, no change in status, stable for surgery.  I have reviewed the patient's chart and labs.  Questions were answered to the patient's satisfaction.     [name] III

## 2019-07-21 NOTE — Progress Notes (Signed)
Paged lindsey pa/ cardiology, pt may restart coumadin tomorrow.

## 2019-08-03 ENCOUNTER — Other Ambulatory Visit: Payer: Self-pay

## 2019-08-03 ENCOUNTER — Ambulatory Visit (INDEPENDENT_AMBULATORY_CARE_PROVIDER_SITE_OTHER): Payer: Medicare Other | Admitting: *Deleted

## 2019-08-03 DIAGNOSIS — Z5181 Encounter for therapeutic drug level monitoring: Secondary | ICD-10-CM | POA: Diagnosis not present

## 2019-08-03 DIAGNOSIS — I4891 Unspecified atrial fibrillation: Secondary | ICD-10-CM | POA: Diagnosis not present

## 2019-08-03 LAB — POCT INR: INR: 1.9 — AB (ref 2.0–3.0)

## 2019-08-03 NOTE — Patient Instructions (Signed)
Take 1 tablet tonight and tomorrow then resume 1 tablet daily except 1/2 tablet on Tuesdays and Fridays On Allopurinol daily for gout.  Call us with any new medications, changes or any bleeding concerns @ [phone].  Recheck in 6 weeks

## 2019-08-10 DIAGNOSIS — G2581 Restless legs syndrome: Secondary | ICD-10-CM | POA: Diagnosis not present

## 2019-08-10 DIAGNOSIS — M545 Low back pain: Secondary | ICD-10-CM | POA: Diagnosis not present

## 2019-08-10 DIAGNOSIS — E1142 Type 2 diabetes mellitus with diabetic polyneuropathy: Secondary | ICD-10-CM | POA: Diagnosis not present

## 2019-08-10 DIAGNOSIS — G894 Chronic pain syndrome: Secondary | ICD-10-CM | POA: Diagnosis not present

## 2019-08-19 ENCOUNTER — Encounter: Payer: Self-pay | Admitting: Internal Medicine

## 2019-08-19 DIAGNOSIS — E7849 Other hyperlipidemia: Secondary | ICD-10-CM | POA: Diagnosis not present

## 2019-08-19 DIAGNOSIS — E119 Type 2 diabetes mellitus without complications: Secondary | ICD-10-CM | POA: Diagnosis not present

## 2019-08-19 DIAGNOSIS — N400 Enlarged prostate without lower urinary tract symptoms: Secondary | ICD-10-CM | POA: Diagnosis not present

## 2019-08-20 NOTE — Progress Notes (Signed)
Cardiology Office Note    Date:  08/22/2019   ID:  [name], DOB 1941-11-09, MRN [id number]  PCP:  [facility]  Cardiologist: Kate Sable, MD    Chief Complaint  Patient presents with   Follow-up    s/p cardiac catheterization    History of Present Illness:    [name] is a 78 y.o. male with past medical history of paroxysmal atrial fibrillation (on Coumadin) and Type 2 DM who presents to the office today for follow-up from his recent cardiac catheterization.   He had a telehealth visit with Dr. Bronson Ing in 05/2019 and reported worsening dyspnea and chest tightness, therefore a Coronary CTA was recommended for further evaluation. This showed plaque along the LAD which was significant by FFR. He had a follow-up telehealth visit with Dr. Bronson Ing and results were reviewed and a cardiac catheterization was recommended for further evaluation. Was started on Crestor 20mg  daily but BB therapy was not initiated given issues with bradycardia in the past.   Cardiac catheterization was performed by Dr. Tamala Julian on 07/21/2019 and showed patent LM and LCx with 40-60% Proximal LAD and 30-40% Pro-RCA stenosis with normal LV function. Aggressive risk factor modification was recommended along with possible sleep study given his snoring.   In talking with the patient today, he reports still having dyspnea on exertion and this can occur with walking up stairs or sometimes when bending down to tie his shoes. He denies any associated chest pain or palpitations. No orthopnea, PND, or lower extremity edema. He does have intermittent dizziness with associated presyncope which typically occurs while sitting down. Denies any associated palpitations, headaches or vision changes. Not triggered by positional changes.   He did not start statin therapy as he reports this was not sent in. He is hesitant about starting any "cardiac medications" as he feels like his wife had  significant side effects to these in the past. He is under increased stress as she is suffering from Parkinson's Disease and her symptoms are quickly progressing. Per his report, she has refused medical treatment for this.    Past Medical History:  Diagnosis Date   Barrett's esophagus    short-segment, diagnosed in 2007 by Dr. [name]   BPH (benign prostatic hyperplasia)    CAD (coronary artery disease)    a. cath on 07/21/2019 showing patent LM and LCx with 40-60% Proximal LAD and 30-40% Pro-RCA stenosis with normal LV function.   GERD (gastroesophageal reflux disease)    Hiatal hernia    small   HTN (hypertension)    PAF (paroxysmal atrial fibrillation) ([place])    S/P colonoscopy 2007   unremarkable   S/P endoscopy Oct 2011    Salmon-colored epithelium coming up to 37 cm from the    Vertigo     Past Surgical History:  Procedure Laterality Date   APPENDECTOMY     CHOLECYSTECTOMY     COLONOSCOPY  02/06/2006   Dr. [name]- single anal papilla, o/w normal rectum, normal colon   COLONOSCOPY N/A 09/13/2016   Procedure: COLONOSCOPY;  Surgeon: [name], MD;  [facility]: [facility];  Service: Endoscopy;  Laterality: N/A;  145    ESOPHAGOGASTRODUODENOSCOPY  09/15/2010   Dr. [name]- Salmon-colored epithelium coming up to 37 cm from the    ESOPHAGOGASTRODUODENOSCOPY N/A 07/02/2013   Dr. [name]- Barretts on bx. hiatal hernia   ESOPHAGOGASTRODUODENOSCOPY N/A 09/13/2016   Procedure: ESOPHAGOGASTRODUODENOSCOPY (EGD);  Surgeon: [name], MD;  [facility]: [facility];  Service: Endoscopy;  Laterality: N/A;   INTRAVASCULAR PRESSURE WIRE/FFR STUDY N/A 07/21/2019   Procedure: INTRAVASCULAR PRESSURE WIRE/FFR STUDY;  Surgeon: [name], MD;  Location: [facility];  Service: Cardiovascular;  Laterality: N/A;   LEFT HEART CATH AND CORONARY ANGIOGRAPHY N/A 07/21/2019   Procedure: LEFT HEART CATH AND CORONARY ANGIOGRAPHY;  Surgeon: [name], MD;  Location: [facility];  Service: Cardiovascular;  Laterality: N/A;   ROTATOR CUFF REPAIR     YAG LASER APPLICATION Right [id number]   Procedure: YAG LASER APPLICATION;  Surgeon: [name], MD;  Location: [facility];  Service: Ophthalmology;  Laterality: Right;    Current Medications: Outpatient Medications Prior to Visit  Medication Sig Dispense Refill   allopurinol (ZYLOPRIM) 100 MG tablet Take 100 mg by mouth daily.      ALPRAZolam (XANAX) 1 MG tablet Take 0.5 mg by mouth 2 (two) times daily as needed for anxiety or sleep.      DULoxetine (CYMBALTA) 30 MG capsule Take 30 mg by mouth daily.     escitalopram (LEXAPRO) 10 MG tablet Take 10 mg by mouth daily.     gabapentin (NEURONTIN) 300 MG capsule Take 300 mg by mouth 2 (two) times daily.      glipiZIDE (GLUCOTROL) 5 MG tablet Take 5 mg by mouth daily before breakfast.   4   pantoprazole (PROTONIX) 40 MG tablet TAKE ONE TABLET BY MOUTH TWICE A DAY (Patient taking differently: Take 40 mg by mouth daily. ) 180 tablet 3   Tamsulosin HCl (FLOMAX) 0.4 MG CAPS Take 0.4 mg by mouth 2 (two) times daily.      warfarin (COUMADIN) 5 MG tablet TAKE ONE TABLET BY MOUTH DAILY EXCEPT ONE-HALF TABLET ON TUESDAYS ANDFRIDAYS OR AS DIRECTED (Patient taking differently: Take 2.5-5 mg by mouth See admin instructions. Take 0.5 tablet (2.5 mg) by mouth on Tuesdays & Friday in the evening, then take 1 tablet (5 mg) by mouth on Mondays, Wednesdays, Thursdays, Saturdays & Sundays.) 45 tablet 4   metoprolol tartrate (LOPRESSOR) 50 MG tablet Take 1 tablet (50 mg total) by mouth once for 1 dose. One hour before your CT 1 tablet 0   Facility-Administered Medications Prior to Visit  Medication Dose Route Frequency Provider Last Rate Last Dose   sodium chloride flush (NS) 0.9 % injection 3 mL  3 mL Intravenous Q12H Herminio Commons, MD         Allergies:   Patient has no known allergies.   Social History   Socioeconomic History   Marital status: Married     Spouse name: Not on file   Number of children: Not on file   Years of education: Not on file   Highest education level: Not on file  Occupational History   Not on file  Social Needs   Financial resource strain: Not on file   Food insecurity    Worry: Not on file    Inability: Not on file   Transportation needs    Medical: Not on file    Non-medical: Not on file  Tobacco Use   Smoking status: Former Smoker    Packs/day: 1.50    Years: 30.00    Pack years: 45.00    Types: Cigarettes    Quit date: 11/18/1993    Years since quitting: 25.7   Smokeless tobacco: Never Used   Tobacco comment: quit in the 90's  Substance and Sexual Activity   Alcohol use: No   Drug  use: No   Sexual activity: Not on file  Lifestyle   Physical activity    Days per week: Not on file    Minutes per session: Not on file   Stress: Not on file  Relationships   Social connections    Talks on phone: Not on file    Gets together: Not on file    Attends religious service: Not on file    Active member of club or organization: Not on file    Attends meetings of clubs or organizations: Not on file    Relationship status: Not on file  Other Topics Concern   Not on file  Social History Narrative   Not on file     Family History:  The patient's family history includes Prostate cancer in his father; Stroke in his mother.   Review of Systems:   Please see the history of present illness.     General:  No chills, fever, night sweats or weight changes. Positive for presyncope.  Cardiovascular:  No chest pain, edema, orthopnea, palpitations, paroxysmal nocturnal dyspnea. Positive for dyspnea.  Dermatological: No rash, lesions/masses Respiratory: No cough.  Urologic: No hematuria, dysuria Abdominal:   No nausea, vomiting, diarrhea, bright red blood per rectum, melena, or hematemesis Neurologic:  No visual changes, wkns, changes in mental status. All other systems reviewed and are otherwise  negative except as noted above.   Physical Exam:    VS:  BP 124/72    Pulse (!) 56    Temp 97.6 F (36.4 C)    Ht 5\' 10"  (1.778 m)    Wt 222 lb (100.7 kg)    SpO2 97%    BMI 31.85 kg/m    General: Well developed, well nourished,male appearing in no acute distress. Head: Normocephalic, atraumatic, sclera non-icteric, no xanthomas, nares are without discharge.  Neck: No carotid bruits. JVD not elevated.  Lungs: Respirations regular and unlabored, without wheezes or rales.  Heart: Irregularly irregular. No S3 or S4.  No murmur, no rubs, or gallops appreciated. Abdomen: Soft, non-tender, non-distended with normoactive bowel sounds. No hepatomegaly. No rebound/guarding. No obvious abdominal masses. Msk:  Strength and tone appear normal for age. No joint deformities or effusions. Extremities: No clubbing or cyanosis. No lower extremity edema.  Distal pedal pulses are 2+ bilaterally. Radial site stable without ecchymosis or evidence of a hematoma.  Neuro: Alert and oriented X 3. Moves all extremities spontaneously. No focal deficits noted. Psych:  Responds to questions appropriately with a normal affect. Skin: No rashes or lesions noted  Wt Readings from Last 3 Encounters:  08/21/19 222 lb (100.7 kg)  07/21/19 212 lb (96.2 kg)  07/13/19 220 lb (99.8 kg)     Studies/Labs Reviewed:   EKG:  EKG is not ordered today.   Recent Labs: 03/27/2019: ALT 20 07/17/2019: Hemoglobin 13.4; Platelets 154 07/21/2019: BUN 11; Creatinine, Ser 1.28; Potassium 3.7; Sodium 142   Lipid Panel No results found for: CHOL, TRIG, HDL, CHOLHDL, VLDL, LDLCALC, LDLDIRECT  Additional studies/ records that were reviewed today include:   Echocardiogram: 04/2018 Study Conclusions  - Left ventricle: The cavity size was normal. Wall thickness was   normal. Systolic function was normal. The estimated ejection   fraction was in the range of 55% to 60%. Wall motion was normal;   there were no regional wall motion  abnormalities. The study was   not technically sufficient to allow evaluation of LV diastolic   dysfunction due to atrial fibrillation. - Aortic valve: Valve  area (VTI): 2.62 cm^2. Valve area (Vmax):   2.77 cm^2. Valve area (Vmean): 2.33 cm^2. - Technically adequate study.  Coronary CT: 06/2019 IMPRESSION: 1.  Calcium score 198 which is 42 nd percentile for age and sex  2.  Normal aortic root 3.7 cm  3.  Area of calcified pericardium near apex  4.  Dilated RPA 3.2 cm  5. Morphologically worrisome mixed plaque in proximal LAD and soft plaque in RCA see description above. Study sent for FFR CT. Despite low HR appears to be significant respiratory artifact If study  Rejected for FFR CT would proceed with cath  FINDINGS: Abnormal FFR CT in mid LAD 0.83 distal LAD 0.79 and first diagonal 0.87  IMPRESSION: Abnormal FFR CT in mid and distal LAD as well as first diagonal Patient scheduled for heart catheterization  Cardiac Catheterization: 07/21/2019  Widely patent left main  40 to 60% proximal LAD with mild calcification.  DFR on LAD stenosis 0.94.  Widely patent circumflex  Very tortuous proximal right coronary with 30 to 40% proximal narrowing.  No significant obstruction identified.  Normal LV function.  RECOMMENDATIONS:   Given the location of the LAD stenosis, he needs aggressive preventive therapy which could include high intensity statin therapy, VASCEPA9 (if TG > 140), and SGLT2.  The patient does snore and perhaps some of his complaints are related to sleep deprivation from obstructive sleep apnea.  Consider sleep study.  Resume anticoagulation therapy this evening.  Assessment:    1. Coronary artery disease involving native coronary artery of native heart without angina pectoris   2. DOE (dyspnea on exertion)   3. Pre-syncope   4. PAF (paroxysmal atrial fibrillation) ([place])   5. Hyperlipidemia LDL goal <70      Plan:   In order of problems  listed above:  1. CAD/ Dyspnea on Exertion - recent cardiac catheterization showed patent LM and LCx with 40-60% Proximal LAD and 30-40% Pro-RCA stenosis with normal LV function. Aggressive risk factor modification was recommended.  - reviewed results with the patient in detail today. He is hesitant to start cholesterol medication, therefore will try dietary modifications initially then recheck lipids. Not on ASA given the need for anticoagulation and intolerant to BB therapy in the past due to bradycardia.  - in regards to his dyspnea, discussed obtaining PFT's as previously recommended in the setting of his former tobacco use but he wishes to review this with his PCP initially at his wellness visit next week prior to going through additional testing.   2. Dizziness/Presyncope - occurs while sitting down and is not associated with positional changes. No associated pain or palpitations per his report. He does mention a history of carotid artery plaque. Dopplers in 2004 showed bilateral thickening with no visible plaque. Given his symptoms and time frame since his last study, will repeat carotid dopplers. Prior monitoring in 2019 showed episodes of fibrillation and flutter but no significant pauses to suggest this could be the etiology of his symptoms.   3. Paroxysmal Atrial Fibrillation/Flutter - it is possible this is contributing to his dyspnea but rates have overall been well-controlled. No longer on BB therapy as he had bradycardia with low-dose Toprol-XL in the past.  - he denies any evidence of active bleeding. Remains on Coumadin for anticoagulation.   4. HLD - he wishes to try dietary modification initially. He has fasting labs with his PCP next week and will use this as his baseline. Will recheck FLP in 2 months. If not at  goal of LDL less than 70, plan to start Crestor as previously recommended.    Medication Adjustments/Labs and Tests Ordered: Current medicines are reviewed at length  with the patient today.  Concerns regarding medicines are outlined above.  Medication changes, Labs and Tests ordered today are listed in the Patient Instructions below. Patient Instructions  Heart-Healthy Eating Plan Many factors influence your heart (coronary) health, including eating and exercise habits. Coronary risk increases with abnormal blood fat (lipid) levels. Heart-healthy meal planning includes limiting unhealthy fats, increasing healthy fats, and making other diet and lifestyle changes.  What are tips for following this plan? Cooking Cook foods using methods other than frying. Baking, boiling, grilling, and broiling are all good options. Other ways to reduce fat include:  Removing the skin from poultry.  Removing all visible fats from meats.  Steaming vegetables in water or broth. Meal planning   At meals, imagine dividing your plate into fourths: ? Fill one-half of your plate with vegetables and green salads. ? Fill one-fourth of your plate with whole grains. ? Fill one-fourth of your plate with lean protein foods.  Eat 4-5 servings of vegetables per day. One serving equals 1 cup raw or cooked vegetable, or 2 cups raw leafy greens.  Eat 4-5 servings of fruit per day. One serving equals 1 medium whole fruit,  cup dried fruit,  cup fresh, frozen, or canned fruit, or  cup 100% fruit juice.  Eat more foods that contain soluble fiber. Examples include apples, broccoli, carrots, beans, peas, and barley. Aim to get 25-30 g of fiber per day.  Increase your consumption of legumes, nuts, and seeds to 4-5 servings per week. One serving of dried beans or legumes equals  cup cooked, 1 serving of nuts is  cup, and 1 serving of seeds equals 1 tablespoon. Fats  Choose healthy fats more often. Choose monounsaturated and polyunsaturated fats, such as olive and canola oils, flaxseeds, walnuts, almonds, and seeds.  Eat more omega-3 fats. Choose salmon, mackerel, sardines, tuna,  flaxseed oil, and ground flaxseeds. Aim to eat fish at least 2 times each week.  Check food labels carefully to identify foods with trans fats or high amounts of saturated fat.  Limit saturated fats. These are found in animal products, such as meats, butter, and cream. Plant sources of saturated fats include palm oil, palm kernel oil, and coconut oil.  Avoid foods with partially hydrogenated oils in them. These contain trans fats. Examples are stick margarine, some tub margarines, cookies, crackers, and other baked goods.  Avoid fried foods. General information  Eat more home-cooked food and less restaurant, buffet, and fast food.  Limit or avoid alcohol.  Limit foods that are high in starch and sugar.  Lose weight if you are overweight. Losing just 5-10% of your body weight can help your overall health and prevent diseases such as diabetes and heart disease.  Monitor your salt (sodium) intake, especially if you have high blood pressure. Talk with your health care provider about your sodium intake.  Try to incorporate more vegetarian meals weekly. What foods can I eat? Fruits All fresh, canned (in natural juice), or frozen fruits. Vegetables Fresh or frozen vegetables (raw, steamed, roasted, or grilled). Green salads. Grains Most grains. Choose whole wheat and whole grains most of the time. Rice and pasta, including brown rice and pastas made with whole wheat. Meats and other proteins Lean, well-trimmed beef, veal, pork, and lamb. Chicken and [place] without skin. All fish and shellfish. Wild duck,  rabbit, pheasant, and venison. Egg whites or low-cholesterol egg substitutes. Dried beans, peas, lentils, and tofu. Seeds and most nuts. Dairy Low-fat or nonfat cheeses, including ricotta and mozzarella. Skim or 1% milk (liquid, powdered, or evaporated). Buttermilk made with low-fat milk. Nonfat or low-fat yogurt. Fats and oils Non-hydrogenated (trans-free) margarines. Vegetable oils,  including soybean, sesame, sunflower, olive, peanut, safflower, corn, canola, and cottonseed. Salad dressings or mayonnaise made with a vegetable oil. Beverages Water (mineral or sparkling). Coffee and tea. Diet carbonated beverages. Sweets and desserts Sherbet, gelatin, and fruit ice. Small amounts of dark chocolate. Limit all sweets and desserts. Seasonings and condiments All seasonings and condiments. The items listed above may not be a complete list of foods and beverages you can eat. Contact a dietitian for more options. What foods are not recommended? Fruits Canned fruit in heavy syrup. Fruit in cream or butter sauce. Fried fruit. Limit coconut. Vegetables Vegetables cooked in cheese, cream, or butter sauce. Fried vegetables. Grains Breads made with saturated or trans fats, oils, or whole milk. Croissants. Sweet rolls. Donuts. High-fat crackers, such as cheese crackers. Meats and other proteins Fatty meats, such as hot dogs, ribs, sausage, bacon, rib-eye roast or steak. High-fat deli meats, such as salami and bologna. Caviar. Domestic duck and goose. Organ meats, such as liver. Dairy Cream, sour cream, cream cheese, and creamed cottage cheese. Whole milk cheeses. Whole or 2% milk (liquid, evaporated, or condensed). Whole buttermilk. Cream sauce or high-fat cheese sauce. Whole-milk yogurt. Fats and oils Meat fat, or shortening. Cocoa butter, hydrogenated oils, palm oil, coconut oil, palm kernel oil. Solid fats and shortenings, including bacon fat, salt pork, lard, and butter. Nondairy cream substitutes. Salad dressings with cheese or sour cream. Beverages Regular sodas and any drinks with added sugar. Sweets and desserts Frosting. Pudding. Cookies. Cakes. Pies. Milk chocolate or white chocolate. Buttered syrups. Full-fat ice cream or ice cream drinks. The items listed above may not be a complete list of foods and beverages to avoid. Contact a dietitian for more  information. Summary  Heart-healthy meal planning includes limiting unhealthy fats, increasing healthy fats, and making other diet and lifestyle changes.  Lose weight if you are overweight. Losing just 5-10% of your body weight can help your overall health and prevent diseases such as diabetes and heart disease.  Focus on eating a balance of foods, including fruits and vegetables, low-fat or nonfat dairy, lean protein, nuts and legumes, whole grains, and heart-healthy oils and fats. This information is not intended to replace advice given to you by your health care provider. Make sure you discuss any questions you have with your health care provider. Document Released: 08/14/2008 Document Revised: 12/13/2017 Document Reviewed: 12/13/2017 Elsevier Patient Education  [place].    Medication Instructions: Your physician recommends that you continue on your current medications as directed. Please refer to the Current Medication list given to you today.   Labwork: Repeat Fasting Lipids in 2 months (early December)  Procedures/Testing: Your physician has requested that you have a carotid duplex. This test is an ultrasound of the carotid arteries in your neck. It looks at blood flow through these arteries that supply the brain with blood. Allow one hour for this exam. There are no restrictions or special instructions.    Follow-Up: In 6 months with Dr.Koneswaran  Any Additional Special Instructions Will Be Listed Below (If Applicable).     If you need a refill on your cardiac medications before your next appointment, please call your pharmacy.  Signed, Erma Heritage, PA-C  08/22/2019 9:27 AM    [facility] [address] [place], [place] [address] Phone: [phone] Fax: [phone]

## 2019-08-21 ENCOUNTER — Ambulatory Visit (INDEPENDENT_AMBULATORY_CARE_PROVIDER_SITE_OTHER): Payer: Medicare Other | Admitting: Student

## 2019-08-21 ENCOUNTER — Encounter: Payer: Self-pay | Admitting: Student

## 2019-08-21 ENCOUNTER — Other Ambulatory Visit: Payer: Self-pay

## 2019-08-21 VITALS — BP 124/72 | HR 56 | Temp 97.6°F | Ht 70.0 in | Wt 222.0 lb

## 2019-08-21 DIAGNOSIS — I209 Angina pectoris, unspecified: Secondary | ICD-10-CM

## 2019-08-21 DIAGNOSIS — E785 Hyperlipidemia, unspecified: Secondary | ICD-10-CM | POA: Diagnosis not present

## 2019-08-21 DIAGNOSIS — R55 Syncope and collapse: Secondary | ICD-10-CM

## 2019-08-21 DIAGNOSIS — I2510 Atherosclerotic heart disease of native coronary artery without angina pectoris: Secondary | ICD-10-CM | POA: Diagnosis not present

## 2019-08-21 DIAGNOSIS — I480 Paroxysmal atrial fibrillation: Secondary | ICD-10-CM

## 2019-08-21 DIAGNOSIS — R0600 Dyspnea, unspecified: Secondary | ICD-10-CM | POA: Diagnosis not present

## 2019-08-21 DIAGNOSIS — R0609 Other forms of dyspnea: Secondary | ICD-10-CM

## 2019-08-21 NOTE — Patient Instructions (Addendum)
Heart-Healthy Eating Plan Many factors influence your heart (coronary) health, including eating and exercise habits. Coronary risk increases with abnormal blood fat (lipid) levels. Heart-healthy meal planning includes limiting unhealthy fats, increasing healthy fats, and making other diet and lifestyle changes. What are tips for following this plan? Cooking Cook foods using methods other than frying. Baking, boiling, grilling, and broiling are all good options. Other ways to reduce fat include:  Removing the skin from poultry.  Removing all visible fats from meats.  Steaming vegetables in water or broth. Meal planning   At meals, imagine dividing your plate into fourths: ? Fill one-half of your plate with vegetables and green salads. ? Fill one-fourth of your plate with whole grains. ? Fill one-fourth of your plate with lean protein foods.  Eat 4-5 servings of vegetables per day. One serving equals 1 cup raw or cooked vegetable, or 2 cups raw leafy greens.  Eat 4-5 servings of fruit per day. One serving equals 1 medium whole fruit,  cup dried fruit,  cup fresh, frozen, or canned fruit, or  cup 100% fruit juice.  Eat more foods that contain soluble fiber. Examples include apples, broccoli, carrots, beans, peas, and barley. Aim to get 25-30 g of fiber per day.  Increase your consumption of legumes, nuts, and seeds to 4-5 servings per week. One serving of dried beans or legumes equals  cup cooked, 1 serving of nuts is  cup, and 1 serving of seeds equals 1 tablespoon. Fats  Choose healthy fats more often. Choose monounsaturated and polyunsaturated fats, such as olive and canola oils, flaxseeds, walnuts, almonds, and seeds.  Eat more omega-3 fats. Choose salmon, mackerel, sardines, tuna, flaxseed oil, and ground flaxseeds. Aim to eat fish at least 2 times each week.  Check food labels carefully to identify foods with trans fats or high amounts of saturated fat.  Limit saturated  fats. These are found in animal products, such as meats, butter, and cream. Plant sources of saturated fats include palm oil, palm kernel oil, and coconut oil.  Avoid foods with partially hydrogenated oils in them. These contain trans fats. Examples are stick margarine, some tub margarines, cookies, crackers, and other baked goods.  Avoid fried foods. General information  Eat more home-cooked food and less restaurant, buffet, and fast food.  Limit or avoid alcohol.  Limit foods that are high in starch and sugar.  Lose weight if you are overweight. Losing just 5-10% of your body weight can help your overall health and prevent diseases such as diabetes and heart disease.  Monitor your salt (sodium) intake, especially if you have high blood pressure. Talk with your health care provider about your sodium intake.  Try to incorporate more vegetarian meals weekly. What foods can I eat? Fruits All fresh, canned (in natural juice), or frozen fruits. Vegetables Fresh or frozen vegetables (raw, steamed, roasted, or grilled). Green salads. Grains Most grains. Choose whole wheat and whole grains most of the time. Rice and pasta, including brown rice and pastas made with whole wheat. Meats and other proteins Lean, well-trimmed beef, veal, pork, and lamb. Chicken and turkey without skin. All fish and shellfish. Wild duck, rabbit, pheasant, and venison. Egg whites or low-cholesterol egg substitutes. Dried beans, peas, lentils, and tofu. Seeds and most nuts. Dairy Low-fat or nonfat cheeses, including ricotta and mozzarella. Skim or 1% milk (liquid, powdered, or evaporated). Buttermilk made with low-fat milk. Nonfat or low-fat yogurt. Fats and oils Non-hydrogenated (trans-free) margarines. Vegetable oils, including soybean, sesame, sunflower,   sunflower, olive, peanut, safflower, corn, canola, and cottonseed. Salad dressings or mayonnaise made with a vegetable oil. Beverages Water (mineral or sparkling). Coffee  and tea. Diet carbonated beverages. Sweets and desserts Sherbet, gelatin, and fruit ice. Small amounts of dark chocolate. Limit all sweets and desserts. Seasonings and condiments All seasonings and condiments. The items listed above may not be a complete list of foods and beverages you can eat. Contact a dietitian for more options. What foods are not recommended? Fruits Canned fruit in heavy syrup. Fruit in cream or butter sauce. Fried fruit. Limit coconut. Vegetables Vegetables cooked in cheese, cream, or butter sauce. Fried vegetables. Grains Breads made with saturated or trans fats, oils, or whole milk. Croissants. Sweet rolls. Donuts. High-fat crackers, such as cheese crackers. Meats and other proteins Fatty meats, such as hot dogs, ribs, sausage, bacon, rib-eye roast or steak. High-fat deli meats, such as salami and bologna. Caviar. Domestic duck and goose. Organ meats, such as liver. Dairy Cream, sour cream, cream cheese, and creamed cottage cheese. Whole milk cheeses. Whole or 2% milk (liquid, evaporated, or condensed). Whole buttermilk. Cream sauce or high-fat cheese sauce. Whole-milk yogurt. Fats and oils Meat fat, or shortening. Cocoa butter, hydrogenated oils, palm oil, coconut oil, palm kernel oil. Solid fats and shortenings, including bacon fat, salt pork, lard, and butter. Nondairy cream substitutes. Salad dressings with cheese or sour cream. Beverages Regular sodas and any drinks with added sugar. Sweets and desserts Frosting. Pudding. Cookies. Cakes. Pies. Milk chocolate or white chocolate. Buttered syrups. Full-fat ice cream or ice cream drinks. The items listed above may not be a complete list of foods and beverages to avoid. Contact a dietitian for more information. Summary  Heart-healthy meal planning includes limiting unhealthy fats, increasing healthy fats, and making other diet and lifestyle changes.  Lose weight if you are overweight. Losing just 5-10% of your  body weight can help your overall health and prevent diseases such as diabetes and heart disease.  Focus on eating a balance of foods, including fruits and vegetables, low-fat or nonfat dairy, lean protein, nuts and legumes, whole grains, and heart-healthy oils and fats. This information is not intended to replace advice given to you by your health care provider. Make sure you discuss any questions you have with your health care provider. Document Released: 08/14/2008 Document Revised: 12/13/2017 Document Reviewed: 12/13/2017 Elsevier Patient Education  [place].    Medication Instructions: Your physician recommends that you continue on your current medications as directed. Please refer to the Current Medication list given to you today.   Labwork: Repeat Fasting Lipids in 2 months (early December)  Procedures/Testing: Your physician has requested that you have a carotid duplex. This test is an ultrasound of the carotid arteries in your neck. It looks at blood flow through these arteries that supply the brain with blood. Allow one hour for this exam. There are no restrictions or special instructions.    Follow-Up: In 6 months with Dr.Koneswaran  Any Additional Special Instructions Will Be Listed Below (If Applicable).     If you need a refill on your cardiac medications before your next appointment, please call your pharmacy.

## 2019-08-22 ENCOUNTER — Encounter: Payer: Self-pay | Admitting: Student

## 2019-08-25 DIAGNOSIS — Z1389 Encounter for screening for other disorder: Secondary | ICD-10-CM | POA: Diagnosis not present

## 2019-08-25 DIAGNOSIS — E785 Hyperlipidemia, unspecified: Secondary | ICD-10-CM | POA: Diagnosis not present

## 2019-08-25 DIAGNOSIS — E039 Hypothyroidism, unspecified: Secondary | ICD-10-CM | POA: Diagnosis not present

## 2019-08-25 DIAGNOSIS — Z6826 Body mass index (BMI) 26.0-26.9, adult: Secondary | ICD-10-CM | POA: Diagnosis not present

## 2019-08-25 DIAGNOSIS — N400 Enlarged prostate without lower urinary tract symptoms: Secondary | ICD-10-CM | POA: Diagnosis not present

## 2019-08-25 DIAGNOSIS — Z0001 Encounter for general adult medical examination with abnormal findings: Secondary | ICD-10-CM | POA: Diagnosis not present

## 2019-08-25 DIAGNOSIS — Z23 Encounter for immunization: Secondary | ICD-10-CM | POA: Diagnosis not present

## 2019-08-25 DIAGNOSIS — F419 Anxiety disorder, unspecified: Secondary | ICD-10-CM | POA: Diagnosis not present

## 2019-08-25 DIAGNOSIS — F329 Major depressive disorder, single episode, unspecified: Secondary | ICD-10-CM | POA: Diagnosis not present

## 2019-08-28 ENCOUNTER — Ambulatory Visit (HOSPITAL_COMMUNITY)
Admission: RE | Admit: 2019-08-28 | Discharge: 2019-08-28 | Disposition: A | Discharge status: Home / Self Care | Payer: Medicare Other | Source: Ambulatory Visit | Attending: Student | Admitting: Student

## 2019-08-28 ENCOUNTER — Other Ambulatory Visit: Payer: Self-pay

## 2019-08-28 DIAGNOSIS — R55 Syncope and collapse: Secondary | ICD-10-CM | POA: Diagnosis not present

## 2019-08-28 DIAGNOSIS — I6523 Occlusion and stenosis of bilateral carotid arteries: Secondary | ICD-10-CM | POA: Diagnosis not present

## 2019-09-14 ENCOUNTER — Telehealth: Payer: Self-pay

## 2019-09-14 NOTE — Telephone Encounter (Signed)
Called pt to discuss changing from warfarin to [place] due to better efficacy and safety data, as well as less frequent monitoring, especially given COVID-19 pandemic.  He is traveling today for a funeral and would like to discuss in more detail tomorrow (10/27). Pt claims they previously tried to switch to Eliquis and did not because of cost. With his insurance [employer]), likely will be between $37-47/month. He asked that we call cell phone tomorrow: [phone].

## 2019-09-15 NOTE — Telephone Encounter (Signed)
Spoke with pt - DOAC therapy is cost prohibitive, he prefers to remain on warfarin at this time.

## 2019-09-16 ENCOUNTER — Other Ambulatory Visit: Payer: Self-pay | Admitting: Gastroenterology

## 2019-09-16 NOTE — Telephone Encounter (Signed)
Patient needs ov. Last seen 2017.

## 2019-09-17 ENCOUNTER — Encounter: Payer: Self-pay | Admitting: Internal Medicine

## 2019-09-17 NOTE — Telephone Encounter (Signed)
PATIENT SCHEDULED AND LETTER SENT  °

## 2019-09-23 ENCOUNTER — Ambulatory Visit (INDEPENDENT_AMBULATORY_CARE_PROVIDER_SITE_OTHER): Payer: Medicare Other | Admitting: *Deleted

## 2019-09-23 ENCOUNTER — Other Ambulatory Visit: Payer: Self-pay

## 2019-09-23 DIAGNOSIS — I4891 Unspecified atrial fibrillation: Secondary | ICD-10-CM | POA: Diagnosis not present

## 2019-09-23 DIAGNOSIS — Z5181 Encounter for therapeutic drug level monitoring: Secondary | ICD-10-CM

## 2019-09-23 LAB — POCT INR: INR: 2.1 (ref 2.0–3.0)

## 2019-09-23 NOTE — Patient Instructions (Signed)
Started prednisone taper today (60,50,40,30,20,10) for gout. Continue warfarin 1 tablet daily except 1/2 tablet on Tuesdays and Fridays Eat extra greens or salads On Allopurinol daily for gout.  Call us with any new medications, changes or any bleeding concerns @ [phone].  Recheck in 6 weeks

## 2019-09-28 ENCOUNTER — Telehealth: Payer: Self-pay | Admitting: Cardiovascular Disease

## 2019-09-28 NOTE — Telephone Encounter (Signed)
Virtual Visit Pre-Appointment Phone Call  "(Name), I am calling you today to discuss your upcoming appointment. We are currently trying to limit exposure to the virus that causes COVID-19 by seeing patients at home rather than in the office."  1. "What is the BEST phone number to call the day of the visit?" - include this in appointment notes  2. Do you have or have access to (through a family member/friend) a smartphone with video capability that we can use for your visit?" a. If yes - list this number in appt notes as cell (if different from BEST phone #) and list the appointment type as a VIDEO visit in appointment notes b. If no - list the appointment type as a PHONE visit in appointment notes  3. Confirm consent - "In the setting of the current Covid19 crisis, you are scheduled for a (phone or video) visit with your provider on (date) at (time).  Just as we do with many in-office visits, in order for you to participate in this visit, we must obtain consent.  If you'd like, I can send this to your mychart (if signed up) or email for you to review.  Otherwise, I can obtain your verbal consent now.  All virtual visits are billed to your insurance company just like a normal visit would be.  By agreeing to a virtual visit, we'd like you to understand that the technology does not allow for your provider to perform an examination, and thus may limit your provider's ability to fully assess your condition. If your provider identifies any concerns that need to be evaluated in person, we will make arrangements to do so.  Finally, though the technology is pretty good, we cannot assure that it will always work on either your or our end, and in the setting of a video visit, we may have to convert it to a phone-only visit.  In either situation, we cannot ensure that we have a secure connection.  Are you willing to proceed?" STAFF: Did the patient verbally acknowledge consent to telehealth visit? Document  YES/NO here: yes  4. Advise patient to be prepared - "Two hours prior to your appointment, go ahead and check your blood pressure, pulse, oxygen saturation, and your weight (if you have the equipment to check those) and write them all down. When your visit starts, your provider will ask you for this information. If you have an Apple Watch or Kardia device, please plan to have heart rate information ready on the day of your appointment. Please have a pen and paper handy nearby the day of the visit as well."  5. Give patient instructions for MyChart download to smartphone OR [email or web address] as below if video visit (depending on what platform provider is using)  6. Inform patient they will receive a phone call 15 minutes prior to their appointment time (may be from unknown caller ID) so they should be prepared to answer    TELEPHONE CALL NOTE  [name] has been deemed a candidate for a follow-up tele-health visit to limit community exposure during the Covid-19 pandemic. I spoke with the patient via phone to ensure availability of phone/video source, confirm preferred email & phone number, and discuss instructions and expectations.  I reminded [name] to be prepared with any vital sign and/or heart rhythm information that could potentially be obtained via home monitoring, at the time of his visit. I reminded [name] to expect a phone call prior to  his visit.  [name] 09/28/2019 2:01 PM   INSTRUCTIONS FOR DOWNLOADING THE MYCHART APP TO SMARTPHONE  - The patient must first make sure to have activated MyChart and know their login information - If Apple, go to [employer] and type in MyChart in the search bar and download the app. If Android, ask patient to go to [employer] and type in [place] in the search bar and download the app. The app is free but as with any other app downloads, their phone may require them to verify saved payment information or Apple/Android  password.  - The patient will need to then log into the app with their MyChart username and password, and select [facility] as their healthcare provider to link the account. When it is time for your visit, go to the MyChart app, find appointments, and click Begin Video Visit. Be sure to Select Allow for your device to access the Microphone and Camera for your visit. You will then be connected, and your provider will be with you shortly.  **If they have any issues connecting, or need assistance please contact MyChart service desk [phone])**  **If using a computer, in order to ensure the best quality for their visit they will need to use either of the following Internet Browsers: [employer], or Google Chrome**  IF USING DOXIMITY or DOXY.ME - The patient will receive a link just prior to their visit by text.     FULL LENGTH CONSENT FOR TELE-HEALTH VISIT   I hereby voluntarily request, consent and authorize [facility] and its employed or contracted physicians, physician assistants, nurse practitioners or other licensed health care professionals (the Practitioner), to provide me with telemedicine health care services (the Services") as deemed necessary by the treating Practitioner. I acknowledge and consent to receive the Services by the Practitioner via telemedicine. I understand that the telemedicine visit will involve communicating with the Practitioner through live audiovisual communication technology and the disclosure of certain medical information by electronic transmission. I acknowledge that I have been given the opportunity to request an in-person assessment or other available alternative prior to the telemedicine visit and am voluntarily participating in the telemedicine visit.  I understand that I have the right to withhold or withdraw my consent to the use of telemedicine in the course of my care at any time, without affecting my right to future care or treatment,  and that the Practitioner or I may terminate the telemedicine visit at any time. I understand that I have the right to inspect all information obtained and/or recorded in the course of the telemedicine visit and may receive copies of available information for a reasonable fee.  I understand that some of the potential risks of receiving the Services via telemedicine include:   Delay or interruption in medical evaluation due to technological equipment failure or disruption;  Information transmitted may not be sufficient (e.g. poor resolution of images) to allow for appropriate medical decision making by the Practitioner; and/or   In rare instances, security protocols could fail, causing a breach of personal health information.  Furthermore, I acknowledge that it is my responsibility to provide information about my medical history, conditions and care that is complete and accurate to the best of my ability. I acknowledge that Practitioner's advice, recommendations, and/or decision may be based on factors not within their control, such as incomplete or inaccurate data provided by me or distortions of diagnostic images or specimens that may result from electronic transmissions. I  understand that the practice of medicine is not an exact science and that Practitioner makes no warranties or guarantees regarding treatment outcomes. I acknowledge that I will receive a copy of this consent concurrently upon execution via email to the email address I last provided but may also request a printed copy by calling the office of [facility].    I understand that my insurance will be billed for this visit.   I have read or had this consent read to me.  I understand the contents of this consent, which adequately explains the benefits and risks of the Services being provided via telemedicine.   I have been provided ample opportunity to ask questions regarding this consent and the Services and have had my questions  answered to my satisfaction.  I give my informed consent for the services to be provided through the use of telemedicine in my medical care  By participating in this telemedicine visit I agree to the above.

## 2019-09-30 ENCOUNTER — Encounter: Payer: Self-pay | Admitting: Cardiovascular Disease

## 2019-09-30 ENCOUNTER — Telehealth (INDEPENDENT_AMBULATORY_CARE_PROVIDER_SITE_OTHER): Payer: Medicare Other | Admitting: Cardiovascular Disease

## 2019-09-30 VITALS — BP 105/71 | HR 86 | Ht 71.0 in | Wt 210.0 lb

## 2019-09-30 DIAGNOSIS — I2510 Atherosclerotic heart disease of native coronary artery without angina pectoris: Secondary | ICD-10-CM

## 2019-09-30 DIAGNOSIS — R55 Syncope and collapse: Secondary | ICD-10-CM

## 2019-09-30 DIAGNOSIS — I480 Paroxysmal atrial fibrillation: Secondary | ICD-10-CM

## 2019-09-30 DIAGNOSIS — I2583 Coronary atherosclerosis due to lipid rich plaque: Secondary | ICD-10-CM

## 2019-09-30 NOTE — Patient Instructions (Signed)

## 2019-09-30 NOTE — Progress Notes (Signed)
Virtual Visit via Telephone Note   This visit type was conducted due to national recommendations for restrictions regarding the COVID-19 Pandemic (e.g. social distancing) in an effort to limit this patient's exposure and mitigate transmission in our community.  Due to his co-morbid illnesses, this patient is at least at moderate risk for complications without adequate follow up.  This format is felt to be most appropriate for this patient at this time.  The patient did not have access to video technology/had technical difficulties with video requiring transitioning to audio format only (telephone).  All issues noted in this document were discussed and addressed.  No physical exam could be performed with this format.  Please refer to the patient's chart for his  consent to telehealth for [facility].   Date:  09/30/2019   ID:  [name], DOB [birth year], MRN [id number]  Patient Location: Home Provider Location: Office  PCP:  [facility]  Cardiologist:  Kate Sable, MD  Electrophysiologist:  None   Evaluation Performed:  Follow-Up Visit  Chief Complaint: Paroxysmal atrial fibrillation  History of Present Illness:    [name] is a 78 y.o. male with paroxysmal atrial fibrillation.  He underwent cardiac catheterization on 07/21/2019 which showed patent LM and LCx with 40-60% Proximal LAD and 30-40% Pro-RCA stenosis with normal LV function. Aggressive risk factor modification was recommended along with possible sleep study given his snoring.   His heart races if he goes up a flight of stairs and has to sit down and rest to recover his breathing.  He denies exertional chest pain.  He has episodes of feeling like he is going to pass out while he is sitting down.  He has not checked his blood pressure or blood sugar these episodes but he has had episodes of hypoglycemia on other occasions.   Past Medical History:  Diagnosis Date  . Barrett's esophagus     short-segment, diagnosed in 2007 by Dr. [name]  . BPH (benign prostatic hyperplasia)   . CAD (coronary artery disease)    a. cath on 07/21/2019 showing patent LM and LCx with 40-60% Proximal LAD and 30-40% Pro-RCA stenosis with normal LV function.  [name] GERD (gastroesophageal reflux disease)   . Hiatal hernia    small  . HTN (hypertension)   . PAF (paroxysmal atrial fibrillation) ([place])   . S/P colonoscopy 2007   unremarkable  . S/P endoscopy Oct 2011    Salmon-colored epithelium coming up to 37 cm from the   . Vertigo    Past Surgical History:  Procedure Laterality Date  . APPENDECTOMY    . CHOLECYSTECTOMY    . COLONOSCOPY  02/06/2006   Dr. [name]- single anal papilla, o/w normal rectum, normal colon  . COLONOSCOPY N/A 09/13/2016   Procedure: COLONOSCOPY;  Surgeon: [name], MD;  [facility];  Service: Endoscopy;  Laterality: N/A;  145   . ESOPHAGOGASTRODUODENOSCOPY  09/15/2010   Dr. [name]- Salmon-colored epithelium coming up to 37 cm from the   . ESOPHAGOGASTRODUODENOSCOPY N/A 07/02/2013   Dr. [name]- Barretts on bx. hiatal hernia  . ESOPHAGOGASTRODUODENOSCOPY N/A 09/13/2016   Procedure: ESOPHAGOGASTRODUODENOSCOPY (EGD);  Surgeon: [name], MD;  [facility];  Service: Endoscopy;  Laterality: N/A;  . INTRAVASCULAR PRESSURE WIRE/FFR STUDY N/A 07/21/2019   Procedure: INTRAVASCULAR PRESSURE WIRE/FFR STUDY;  Surgeon: [name], MD;  Location: [place] [facility];  Service: Cardiovascular;  Laterality: N/A;  . LEFT HEART CATH AND CORONARY  ANGIOGRAPHY N/A 07/21/2019   Procedure: LEFT HEART CATH AND CORONARY ANGIOGRAPHY;  Surgeon: [name], MD;  Location: [place] [facility];  Service: Cardiovascular;  Laterality: N/A;  . ROTATOR CUFF REPAIR    . YAG LASER APPLICATION Right [id number]   Procedure: YAG LASER APPLICATION;  Surgeon: [name], MD;  Location: [facility];  Service: Ophthalmology;  Laterality: Right;     Current Meds  Medication Sig  .  allopurinol (ZYLOPRIM) 100 MG tablet Take 100 mg by mouth daily.   [name] ALPRAZolam (XANAX) 1 MG tablet Take 0.5 mg by mouth 2 (two) times daily as needed for anxiety or sleep.   [name] atorvastatin (LIPITOR) 40 MG tablet Take 40 mg by mouth daily.   . DULoxetine (CYMBALTA) 30 MG capsule Take 30 mg by mouth daily.  [name] escitalopram (LEXAPRO) 10 MG tablet Take 10 mg by mouth daily.  [name] gabapentin (NEURONTIN) 300 MG capsule Take 300 mg by mouth 2 (two) times daily.   [name] glipiZIDE (GLUCOTROL) 5 MG tablet Take 5 mg by mouth daily before breakfast.   . pantoprazole (PROTONIX) 40 MG tablet TAKE ONE TABLET BY MOUTH TWICE A DAY  . Tamsulosin HCl (FLOMAX) 0.4 MG CAPS Take 0.4 mg by mouth 2 (two) times daily.   [name] warfarin (COUMADIN) 5 MG tablet TAKE ONE TABLET BY MOUTH DAILY EXCEPT ONE-HALF TABLET ON TUESDAYS ANDFRIDAYS OR AS DIRECTED (Patient taking differently: Take 2.5-5 mg by mouth See admin instructions. Take 0.5 tablet (2.5 mg) by mouth on Tuesdays & Friday in the evening, then take 1 tablet (5 mg) by mouth on Mondays, Wednesdays, Thursdays, Saturdays & Sundays.)   Current Facility-Administered Medications for the 09/30/19 encounter (Telemedicine) with Herminio Commons, MD  Medication  . sodium chloride flush (NS) 0.9 % injection 3 mL     Allergies:   Patient has no known allergies.   Social History   Tobacco Use  . Smoking status: Former Smoker    Packs/day: 1.50    Years: 30.00    Pack years: 45.00    Types: Cigarettes    Quit date: 11/18/1993    Years since quitting: 25.8  . Smokeless tobacco: Never Used  . Tobacco comment: quit in the 90's  Substance Use Topics  . Alcohol use: No  . Drug use: No     Family Hx: The patient's family history includes Prostate cancer in his father; Stroke in his mother. There is no history of Colon cancer.  ROS:   Please see the history of present illness.     All other systems reviewed and are negative.   Prior CV studies:   The following studies  were reviewed today:  Echocardiogram: 04/2018 Study Conclusions  - Left ventricle: The cavity size was normal. Wall thickness was normal. Systolic function was normal. The estimated ejection fraction was in the range of 55% to 60%. Wall motion was normal; there were no regional wall motion abnormalities. The study was not technically sufficient to allow evaluation of LV diastolic dysfunction due to atrial fibrillation. - Aortic valve: Valve area (VTI): 2.62 cm^2. Valve area (Vmax): 2.77 cm^2. Valve area (Vmean): 2.33 cm^2. - Technically adequate study.  Coronary CT: 06/2019 IMPRESSION: 1. Calcium score 198 which is 42 nd percentile for age and sex  2. Normal aortic root 3.7 cm  3. Area of calcified pericardium near apex  4. Dilated RPA 3.2 cm  5. Morphologically worrisome mixed plaque in proximal LAD and soft plaque in RCA see description above.  Study sent for FFR CT. Despite low HR appears to be significant respiratory artifact If study  Rejected for FFR CT would proceed with cath  FINDINGS: Abnormal FFR CT in mid LAD 0.83 distal LAD 0.79 and first diagonal 0.87  IMPRESSION: Abnormal FFR CT in mid and distal LAD as well as first diagonal Patient scheduled for heart catheterization  Cardiac Catheterization: 07/21/2019  Widely patent left main  40 to 60% proximal LAD with mild calcification. DFR on LAD stenosis 0.94.  Widely patent circumflex  Very tortuous proximal right coronary with 30 to 40% proximal narrowing. No significant obstruction identified.  Normal LV function.  RECOMMENDATIONS:   Given the location of the LAD stenosis, he needs aggressive preventive therapy which could include high intensity statin therapy, VASCEPA9 (if TG > 140), and SGLT2.  The patient does snore and perhaps some of his complaints are related to sleep deprivation from obstructive sleep apnea. Consider sleep study.  Resume anticoagulation therapy  this evening.  Carotid Dopplers 08/28/2019: Less than 50% stenosis bilaterally.  Labs/Other Tests and Data Reviewed:    EKG:  No ECG reviewed.  Recent Labs: 03/27/2019: ALT 20 07/17/2019: Hemoglobin 13.4; Platelets 154 07/21/2019: BUN 11; Creatinine, Ser 1.28; Potassium 3.7; Sodium 142   Recent Lipid Panel No results found for: CHOL, TRIG, HDL, CHOLHDL, LDLCALC, LDLDIRECT  Wt Readings from Last 3 Encounters:  09/30/19 210 lb (95.3 kg)  08/21/19 222 lb (100.7 kg)  07/21/19 212 lb (96.2 kg)     Objective:    Vital Signs:  BP 105/71   Pulse 86   Ht 5\' 11"  (1.803 m)   Wt 210 lb (95.3 kg)   BMI 29.29 kg/m    VITAL SIGNS:  reviewed  ASSESSMENT & PLAN:    1.  Paroxysmal atrial fibrillation/flutter: He developed bradycardia with low-dose Toprol-XL in the past and is thus not on beta-blockade.  He has occasional palpitations.  He is on warfarin for anticoagulation. INR 2.1 on 09/23/19.  2.  Coronary artery disease: Cardiac catheterization reviewed above which showed patent LM and LCx with 40-60% Proximal LAD and 30-40% Pro-RCA stenosis with normal LV function.  Continue risk factor modification.  3.  Dizziness/presyncope: These episodes occur while he is sitting down.  They are not positional in nature.  He denies associated palpitations.  I asked him to monitor his blood sugar and blood pressure during these episodes.   COVID-19 Education: The signs and symptoms of COVID-19 were discussed with the patient and how to seek care for testing (follow up with PCP or arrange E-visit).  The importance of social distancing was discussed today.  Time:   Today, I have spent 10 minutes with the patient with telehealth technology discussing the above problems.     Medication Adjustments/Labs and Tests Ordered: Current medicines are reviewed at length with the patient today.  Concerns regarding medicines are outlined above.   Tests Ordered: No orders of the defined types were placed in this  encounter.   Medication Changes: No orders of the defined types were placed in this encounter.   Follow Up:  Either In Person or Virtual in 6 month(s)  Signed, Kate Sable, MD  09/30/2019 9:46 AM    [place]

## 2019-10-07 DIAGNOSIS — M545 Low back pain: Secondary | ICD-10-CM | POA: Diagnosis not present

## 2019-10-07 DIAGNOSIS — E1142 Type 2 diabetes mellitus with diabetic polyneuropathy: Secondary | ICD-10-CM | POA: Diagnosis not present

## 2019-10-07 DIAGNOSIS — G894 Chronic pain syndrome: Secondary | ICD-10-CM | POA: Diagnosis not present

## 2019-10-07 DIAGNOSIS — G2581 Restless legs syndrome: Secondary | ICD-10-CM | POA: Diagnosis not present

## 2019-10-08 ENCOUNTER — Telehealth: Payer: Self-pay | Admitting: *Deleted

## 2019-10-08 ENCOUNTER — Other Ambulatory Visit: Payer: Self-pay | Admitting: Neurology

## 2019-10-08 ENCOUNTER — Other Ambulatory Visit (HOSPITAL_COMMUNITY): Payer: Self-pay | Admitting: Neurology

## 2019-10-08 DIAGNOSIS — M5416 Radiculopathy, lumbar region: Secondary | ICD-10-CM

## 2019-10-12 ENCOUNTER — Ambulatory Visit (INDEPENDENT_AMBULATORY_CARE_PROVIDER_SITE_OTHER): Payer: Medicare Other | Admitting: *Deleted

## 2019-10-12 ENCOUNTER — Other Ambulatory Visit: Payer: Self-pay

## 2019-10-12 DIAGNOSIS — I4891 Unspecified atrial fibrillation: Secondary | ICD-10-CM

## 2019-10-12 DIAGNOSIS — Z5181 Encounter for therapeutic drug level monitoring: Secondary | ICD-10-CM | POA: Diagnosis not present

## 2019-10-12 LAB — POCT INR: INR: 2 (ref 2.0–3.0)

## 2019-10-12 MED ORDER — WARFARIN SODIUM 5 MG PO TABS: ORAL_TABLET | ORAL | 4 refills | Status: DC

## 2019-10-12 NOTE — Patient Instructions (Signed)
Continue warfarin 1 tablet daily except 1/2 tablet on Tuesdays and Fridays Continue greens or salads On Allopurinol daily for gout.  Call us with any new medications, changes or any bleeding concerns @ [phone].  Recheck in 6 weeks

## 2019-10-19 ENCOUNTER — Other Ambulatory Visit (HOSPITAL_COMMUNITY)
Admission: RE | Admit: 2019-10-19 | Discharge: 2019-10-19 | Disposition: A | Discharge status: Home / Self Care | Payer: Medicare Other | Source: Ambulatory Visit | Attending: Gastroenterology | Admitting: Gastroenterology

## 2019-10-19 ENCOUNTER — Encounter: Payer: Self-pay | Admitting: Gastroenterology

## 2019-10-19 ENCOUNTER — Ambulatory Visit (HOSPITAL_COMMUNITY)
Admission: RE | Admit: 2019-10-19 | Discharge: 2019-10-19 | Disposition: A | Discharge status: Home / Self Care | Payer: Medicare Other | Source: Ambulatory Visit | Attending: Neurology | Admitting: Neurology

## 2019-10-19 ENCOUNTER — Ambulatory Visit (HOSPITAL_COMMUNITY): Payer: Medicare Other

## 2019-10-19 ENCOUNTER — Ambulatory Visit (INDEPENDENT_AMBULATORY_CARE_PROVIDER_SITE_OTHER): Payer: Medicare Other | Admitting: Gastroenterology

## 2019-10-19 ENCOUNTER — Other Ambulatory Visit: Payer: Self-pay

## 2019-10-19 VITALS — BP 127/75 | HR 65 | Temp 96.9°F | Ht 69.0 in | Wt 216.6 lb

## 2019-10-19 DIAGNOSIS — K2270 Barrett's esophagus without dysplasia: Secondary | ICD-10-CM

## 2019-10-19 DIAGNOSIS — I4891 Unspecified atrial fibrillation: Secondary | ICD-10-CM | POA: Diagnosis not present

## 2019-10-19 DIAGNOSIS — F329 Major depressive disorder, single episode, unspecified: Secondary | ICD-10-CM | POA: Diagnosis not present

## 2019-10-19 DIAGNOSIS — R5383 Other fatigue: Secondary | ICD-10-CM | POA: Diagnosis not present

## 2019-10-19 DIAGNOSIS — M545 Low back pain: Secondary | ICD-10-CM | POA: Diagnosis not present

## 2019-10-19 DIAGNOSIS — E7849 Other hyperlipidemia: Secondary | ICD-10-CM | POA: Diagnosis not present

## 2019-10-19 DIAGNOSIS — I209 Angina pectoris, unspecified: Secondary | ICD-10-CM

## 2019-10-19 DIAGNOSIS — M5416 Radiculopathy, lumbar region: Secondary | ICD-10-CM | POA: Insufficient documentation

## 2019-10-19 DIAGNOSIS — K2100 Gastro-esophageal reflux disease with esophagitis, without bleeding: Secondary | ICD-10-CM

## 2019-10-19 DIAGNOSIS — E1140 Type 2 diabetes mellitus with diabetic neuropathy, unspecified: Secondary | ICD-10-CM | POA: Diagnosis not present

## 2019-10-19 DIAGNOSIS — Z86010 Personal history of colonic polyps: Secondary | ICD-10-CM | POA: Insufficient documentation

## 2019-10-19 LAB — CBC WITH DIFFERENTIAL/PLATELET
Abs Immature Granulocytes: 0.01 10*3/uL (ref 0.00–0.07)
Basophils Absolute: 0 10*3/uL (ref 0.0–0.1)
Basophils Relative: 1 %
Eosinophils Absolute: 0.2 10*3/uL (ref 0.0–0.5)
Eosinophils Relative: 4 %
HCT: 38.1 % — ABNORMAL LOW (ref 39.0–52.0)
Hemoglobin: 12.6 g/dL — ABNORMAL LOW (ref 13.0–17.0)
Immature Granulocytes: 0 %
Lymphocytes Relative: 19 %
Lymphs Abs: 0.9 10*3/uL (ref 0.7–4.0)
MCH: 31.2 pg (ref 26.0–34.0)
MCHC: 33.1 g/dL (ref 30.0–36.0)
MCV: 94.3 fL (ref 80.0–100.0)
Monocytes Absolute: 0.5 10*3/uL (ref 0.1–1.0)
Monocytes Relative: 10 %
Neutro Abs: 3.1 10*3/uL (ref 1.7–7.7)
Neutrophils Relative %: 66 %
Platelets: 155 10*3/uL (ref 150–400)
RBC: 4.04 MIL/uL — ABNORMAL LOW (ref 4.22–5.81)
RDW: 13.5 % (ref 11.5–15.5)
WBC: 4.7 10*3/uL (ref 4.0–10.5)
nRBC: 0 % (ref 0.0–0.2)

## 2019-10-19 MED ORDER — PANTOPRAZOLE SODIUM 40 MG PO TBEC: 40.0000 mg | DELAYED_RELEASE_TABLET | Freq: Two times a day (BID) | ORAL | 3 refills | Status: DC

## 2019-10-19 NOTE — Assessment & Plan Note (Signed)
DUE For surveillance colonoscopy for adenomatous colon polyps.  Once acute health issues addressed (i.e. dizziness/lightheadedness/back pain) consider updating colonoscopy with deep sedation.  Will need to hold Coumadin prior to procedure.

## 2019-10-19 NOTE — Assessment & Plan Note (Signed)
Notable decline in hemoglobin from May to August.  Patient denies overt GI bleeding.  He is chronically anticoagulated with Coumadin.  Given his significant fatigability, dyspnea on exertion we will update his hemoglobin to exclude anemia as a contributing factor.

## 2019-10-19 NOTE — Assessment & Plan Note (Signed)
Typical reflux well controlled on pantoprazole 40 mg twice daily.  Continue current regimen.  See assessment and plan for Barrett's.

## 2019-10-19 NOTE — Assessment & Plan Note (Signed)
Due for surveillance EGD at this time for Barrett's esophagus.  CT recently with circumferential thickening of the distal esophagus.  Patient having issues with lightheadedness/dizziness with recent cardiology evaluation as outlined.  Also with MRI scheduled for today for back pain.  Once these issues addressed, consider updating EGD.  Plan for deep sedation as patient states sedation was not adequate last time with conscious sedation. WILL NEED TO HOLD COUMADIN PRIOR TO PROCEDURE.

## 2019-10-19 NOTE — Patient Instructions (Signed)
1. Please have your labs done at [facility] when you go for MRI today. Take the orders with you. 2. Continue pantoprazole once to twice daily before a meal.  3. We will consider upper endoscopy and colonoscopy once you have addressed your back pain, lightheadedness/dizziness/fatigue.

## 2019-10-19 NOTE — Progress Notes (Signed)
Primary Care Physician: [facility]  Primary Gastroenterologist:  Garfield Cornea, MD   Chief Complaint  Patient presents with  . Gastroesophageal Reflux    occ episodes of heartburn    HPI: [name] is a 78 y.o. male here for follow up of GERD. Last seen in 2017.  Colonoscopy October 2017 with three to 5-6 mm polyps removed, tubular adenomas. EGD 08/2016 with Barrett's esophagus without dysplasia. Recommended one last EGD/TCS in 3 years if health permitting.   Diagnosed with new onset A. fib June 2019.  CT coronary morphology with CTA August 2020 with mild circumferential thickening of the distal esophagus with potential small associated hiatal hernia.  Distal esophageal pathology such as esophagitis, Barrett's, subtle tumor cannot be excluded by CT.  Anticoagulated on Coumadin.  Underwent cardiac catheterization September 2020, which showed patent LM and LCx with 40-60% Proximal LAD and 30-40% Pro-RCA stenosis with normal LV function. Aggressive risk factor modification was recommended along with possible sleep study given his snoring. Episodes of dizziness/presyncope while sitting down.  Cardiology recommended he check his blood sugar and blood pressure during these episodes.  Patient has been having a lot of issues with fatigability, lightheadedness/dizziness.  Gets significantly fatigued and short of breath with climbing up stairs from his basement.  Has been seeing cardiology for all of these concerns.  Labs in August with no indication of anemia although from May to August his hemoglobin dropped from 14.7-13.  Chronically anticoagulated for history of A. fib.  Recently having a lot of back pain, scheduled for MRI today.  For the most part his reflux is well controlled on pantoprazole twice daily.  No dysphagia.  No abdominal pain.  Bowel movements are regular.  No blood in the stool or melena.     Current Outpatient Medications  Medication Sig Dispense  Refill  . allopurinol (ZYLOPRIM) 100 MG tablet Take 100 mg by mouth daily.     [name] ALPRAZolam (XANAX) 1 MG tablet Take 0.5 mg by mouth 2 (two) times daily as needed for anxiety or sleep.     [name] atorvastatin (LIPITOR) 40 MG tablet Take 40 mg by mouth daily.     . Coenzyme Q10 (CO Q10) 100 MG CAPS Take by mouth daily.    . DULoxetine (CYMBALTA) 30 MG capsule Take 30 mg by mouth daily.    . fexofenadine (ALLEGRA) 180 MG tablet Take 180 mg by mouth daily.    [name] gabapentin (NEURONTIN) 300 MG capsule Take 300 mg by mouth 2 (two) times daily.     [name] glipiZIDE (GLUCOTROL) 5 MG tablet Take 5 mg by mouth daily before breakfast.   4  . metoprolol succinate (TOPROL-XL) 25 MG 24 hr tablet Take 25 mg by mouth daily.    . pantoprazole (PROTONIX) 40 MG tablet TAKE ONE TABLET BY MOUTH TWICE A DAY 180 tablet 0  . Tamsulosin HCl (FLOMAX) 0.4 MG CAPS Take 0.4 mg by mouth 2 (two) times daily.     [name] warfarin (COUMADIN) 5 MG tablet TAKE ONE TABLET BY MOUTH DAILY EXCEPT ONE-HALF TABLET ON TUESDAYS AND FRIDAYS OR AS DIRECTED 45 tablet 4   Current Facility-Administered Medications  Medication Dose Route Frequency Provider Last Rate Last Dose  . sodium chloride flush (NS) 0.9 % injection 3 mL  3 mL Intravenous Q12H Herminio Commons, MD        Allergies as of 10/19/2019  . (No Known Allergies)   Past Medical History:  Diagnosis  Date  . Barrett's esophagus    short-segment, diagnosed in 2007 by Dr. [name]  . BPH (benign prostatic hyperplasia)   . CAD (coronary artery disease)    a. cath on 07/21/2019 showing patent LM and LCx with 40-60% Proximal LAD and 30-40% Pro-RCA stenosis with normal LV function.  [name] GERD (gastroesophageal reflux disease)   . Hiatal hernia    small  . HTN (hypertension)   . PAF (paroxysmal atrial fibrillation) ([place])   . S/P colonoscopy 2007   unremarkable  . S/P endoscopy Oct 2011    Salmon-colored epithelium coming up to 37 cm from the   . Vertigo    Past Surgical History:  Procedure  Laterality Date  . APPENDECTOMY    . CHOLECYSTECTOMY    . COLONOSCOPY  02/06/2006   Dr. [name]- single anal papilla, o/w normal rectum, normal colon  . COLONOSCOPY N/A 09/13/2016   RMR: three 5-6 mm for adenomas removed.  Recommend one last surveillance colonoscopy in October 2020.  [name] ESOPHAGOGASTRODUODENOSCOPY  09/15/2010   Dr. [name]- Salmon-colored epithelium coming up to 37 cm from the   . ESOPHAGOGASTRODUODENOSCOPY N/A 07/02/2013   Dr. [name]- Barretts on bx. hiatal hernia  . ESOPHAGOGASTRODUODENOSCOPY N/A 09/13/2016   RMR: Barrett's esophagus without dysplasia. one last EGD in 3 years.   . INTRAVASCULAR PRESSURE WIRE/FFR STUDY N/A 07/21/2019   Procedure: INTRAVASCULAR PRESSURE WIRE/FFR STUDY;  Surgeon: [name], MD;  Location: [place] [facility];  Service: Cardiovascular;  Laterality: N/A;  . LEFT HEART CATH AND CORONARY ANGIOGRAPHY N/A 07/21/2019   Procedure: LEFT HEART CATH AND CORONARY ANGIOGRAPHY;  Surgeon: [name], MD;  Location: [place] [facility];  Service: Cardiovascular;  Laterality: N/A;  . ROTATOR CUFF REPAIR    . YAG LASER APPLICATION Right [id number]   Procedure: YAG LASER APPLICATION;  Surgeon: [name], MD;  Location: [facility];  Service: Ophthalmology;  Laterality: Right;   Family History  Problem Relation Age of Onset  . Prostate cancer Father        deceased  . Stroke Mother        deceased  . Colon cancer Neg Hx    Social History   Socioeconomic History  . Marital status: Married    Spouse name: Not on file  . Number of children: Not on file  . Years of education: Not on file  . Highest education level: Not on file  Occupational History  . Not on file  Social Needs  . Financial resource strain: Not on file  . Food insecurity    Worry: Not on file    Inability: Not on file  . Transportation needs    Medical: Not on file    Non-medical: Not on file  Tobacco Use  . Smoking status: Former Smoker    Packs/day: 1.50    Years: 30.00    Pack  years: 45.00    Types: Cigarettes    Quit date: 11/18/1993    Years since quitting: 25.9  . Smokeless tobacco: Never Used  . Tobacco comment: quit in the 90's  Substance and Sexual Activity  . Alcohol use: No  . Drug use: No  . Sexual activity: Not on file  Lifestyle  . Physical activity    Days per week: Not on file    Minutes per session: Not on file  . Stress: Not on file  Relationships  . Social Herbalist on phone: Not on file    Gets  together: Not on file    Attends religious service: Not on file    Active member of club or organization: Not on file    Attends meetings of clubs or organizations: Not on file    Relationship status: Not on file  . Intimate partner violence    Fear of current or ex partner: Not on file    Emotionally abused: Not on file    Physically abused: Not on file    Forced sexual activity: Not on file  Other Topics Concern  . Not on file  Social History Narrative  . Not on file    ROS:  General: Negative for anorexia, weight loss, fever, chills, +fatigue, +weakness. ENT: Negative for hoarseness, difficulty swallowing , nasal congestion. CV: Negative for chest pain, angina, palpitations, dyspnea on exertion, peripheral edema.  Respiratory: Negative for dyspnea at rest, +dyspnea on exertion, cough, sputum, wheezing.  GI: See history of present illness. GU:  Negative for dysuria, hematuria, urinary incontinence, urinary frequency, nocturnal urination.  Endo: Negative for unusual weight change.    Physical Examination:   BP 127/75   Pulse 65   Temp (!) 96.9 F (36.1 C) (Temporal)   Ht 5\' 9"  (1.753 m)   Wt 216 lb 9.6 oz (98.2 kg)   BMI 31.99 kg/m   General: Well-nourished, well-developed in no acute distress.  Examined patient in the chair due to back pain.  He did not feel like he could lay down on the exam table. Eyes: No icterus. Mouth: masked Lungs: Clear to auscultation bilaterally.  Heart: Regular rate and rhythm, no  murmurs rubs or gallops.  Abdomen: Bowel sounds are normal, nontender, nondistended, no hepatosplenomegaly or masses, no abdominal bruits or hernia , no rebound or guarding.   Extremities: No lower extremity edema. No clubbing or deformities. Neuro: Alert and oriented x 4   Skin: Warm and dry, no jaundice.   Psych: Alert and cooperative, normal mood and affect.  Labs:  Lab Results  Component Value Date   INR 2.0 10/12/2019   INR 2.1 09/23/2019   INR 1.9 (A) 08/03/2019   Lab Results  Component Value Date   CREATININE 1.28 (H) 07/21/2019   BUN 11 07/21/2019   NA 142 07/21/2019   K 3.7 07/21/2019   CL 110 07/21/2019   CO2 24 07/21/2019   Lab Results  Component Value Date   ALT 20 03/27/2019   AST 24 03/27/2019   ALKPHOS 58 03/27/2019   BILITOT 0.9 03/27/2019   Lab Results  Component Value Date   WBC 5.0 07/17/2019   HGB 13.4 07/17/2019   HCT 40.0 07/17/2019   MCV 94.1 07/17/2019   PLT 154 07/17/2019   No results found for: HGBA1C  Imaging Studies: No results found.

## 2019-10-30 ENCOUNTER — Other Ambulatory Visit: Payer: Self-pay

## 2019-10-30 DIAGNOSIS — D649 Anemia, unspecified: Secondary | ICD-10-CM

## 2019-10-30 DIAGNOSIS — R933 Abnormal findings on diagnostic imaging of other parts of digestive tract: Secondary | ICD-10-CM

## 2019-10-30 DIAGNOSIS — K2270 Barrett's esophagus without dysplasia: Secondary | ICD-10-CM

## 2019-10-30 DIAGNOSIS — Z86010 Personal history of colonic polyps: Secondary | ICD-10-CM

## 2019-10-30 MED ORDER — PEG 3350-KCL-NA BICARB-NACL 420 G PO SOLR: 4000.0000 mL | ORAL | 0 refills | Status: DC

## 2019-11-02 ENCOUNTER — Other Ambulatory Visit: Payer: Self-pay

## 2019-11-02 DIAGNOSIS — D649 Anemia, unspecified: Secondary | ICD-10-CM

## 2019-11-06 ENCOUNTER — Telehealth: Payer: Self-pay | Admitting: *Deleted

## 2019-11-06 NOTE — Telephone Encounter (Signed)
Pt called [place] office and left message stating he's going to start Prednisone again, he wanted Lattie Haw to be aware and wanted to know if there would be any changes to his blood thinner  [phone]

## 2019-11-06 NOTE — Telephone Encounter (Signed)
Returned call to pt - he picked up methylprednisolone today and will be starting 4mg  taper - 6 tabs today, then 5 tabs tomorrow, etc. Moved patient's appt to Monday for INR check.

## 2019-11-09 ENCOUNTER — Other Ambulatory Visit: Payer: Self-pay

## 2019-11-09 ENCOUNTER — Ambulatory Visit (INDEPENDENT_AMBULATORY_CARE_PROVIDER_SITE_OTHER): Payer: Medicare Other | Admitting: *Deleted

## 2019-11-09 DIAGNOSIS — Z5181 Encounter for therapeutic drug level monitoring: Secondary | ICD-10-CM

## 2019-11-09 DIAGNOSIS — I4891 Unspecified atrial fibrillation: Secondary | ICD-10-CM | POA: Diagnosis not present

## 2019-11-09 LAB — POCT INR: INR: 2 (ref 2.0–3.0)

## 2019-11-09 NOTE — Patient Instructions (Signed)
Continue warfarin 1 tablet daily except 1/2 tablet on Tuesdays and Fridays Continue greens or salads On Prednisone taper for back.  Call us with any new medications, changes or any bleeding concerns @ [phone].  Recheck in 4 weeks

## 2019-11-19 DIAGNOSIS — I480 Paroxysmal atrial fibrillation: Secondary | ICD-10-CM | POA: Diagnosis not present

## 2019-11-19 DIAGNOSIS — E119 Type 2 diabetes mellitus without complications: Secondary | ICD-10-CM | POA: Diagnosis not present

## 2019-11-19 DIAGNOSIS — F329 Major depressive disorder, single episode, unspecified: Secondary | ICD-10-CM | POA: Diagnosis not present

## 2019-11-19 DIAGNOSIS — E7849 Other hyperlipidemia: Secondary | ICD-10-CM | POA: Diagnosis not present

## 2019-11-25 DIAGNOSIS — D649 Anemia, unspecified: Secondary | ICD-10-CM | POA: Diagnosis not present

## 2019-11-26 LAB — CBC WITH DIFFERENTIAL/PLATELET
Absolute Monocytes: 385 cells/uL (ref 200–950)
Basophils Absolute: 31 cells/uL (ref 0–200)
Basophils Relative: 0.6 %
Eosinophils Absolute: 213 cells/uL (ref 15–500)
Eosinophils Relative: 4.1 %
HCT: 40.6 % (ref 38.5–50.0)
Hemoglobin: 13.7 g/dL (ref 13.2–17.1)
Lymphs Abs: 816 cells/uL — ABNORMAL LOW (ref 850–3900)
MCH: 30.8 pg (ref 27.0–33.0)
MCHC: 33.7 g/dL (ref 32.0–36.0)
MCV: 91.2 fL (ref 80.0–100.0)
MPV: 10.4 fL (ref 7.5–12.5)
Monocytes Relative: 7.4 %
Neutro Abs: 3754 cells/uL (ref 1500–7800)
Neutrophils Relative %: 72.2 %
Platelets: 135 10*3/uL — ABNORMAL LOW (ref 140–400)
RBC: 4.45 10*6/uL (ref 4.20–5.80)
RDW: 13.7 % (ref 11.0–15.0)
Total Lymphocyte: 15.7 %
WBC: 5.2 10*3/uL (ref 3.8–10.8)

## 2019-11-26 LAB — FERRITIN: Ferritin: 104 ng/mL (ref 24–380)

## 2019-11-30 ENCOUNTER — Other Ambulatory Visit: Payer: Self-pay

## 2019-11-30 DIAGNOSIS — D649 Anemia, unspecified: Secondary | ICD-10-CM

## 2019-11-30 DIAGNOSIS — Z79899 Other long term (current) drug therapy: Secondary | ICD-10-CM

## 2019-12-01 ENCOUNTER — Other Ambulatory Visit: Payer: Self-pay

## 2019-12-01 ENCOUNTER — Ambulatory Visit (INDEPENDENT_AMBULATORY_CARE_PROVIDER_SITE_OTHER): Payer: Medicare Other | Admitting: Urology

## 2019-12-01 ENCOUNTER — Encounter: Payer: Self-pay | Admitting: Urology

## 2019-12-01 VITALS — BP 118/73 | HR 63 | Temp 96.8°F | Ht 70.0 in | Wt 212.0 lb

## 2019-12-01 DIAGNOSIS — R972 Elevated prostate specific antigen [PSA]: Secondary | ICD-10-CM | POA: Diagnosis not present

## 2019-12-01 LAB — POCT URINALYSIS DIPSTICK
Bilirubin, UA: NEGATIVE
Glucose, UA: NEGATIVE
Ketones, UA: NEGATIVE
Leukocytes, UA: NEGATIVE
Nitrite, UA: NEGATIVE
Protein, UA: NEGATIVE
Spec Grav, UA: 1.025 (ref 1.010–1.025)
Urobilinogen, UA: NEGATIVE E.U./dL — AB
pH, UA: 5 (ref 5.0–8.0)

## 2019-12-01 NOTE — Progress Notes (Signed)
H&P  Chief Complaint: BPH w/ LUTS and Elevated PSA  History of Present Illness:   1.12.2021: Typically seen by Dr Jeffie Pollock, he returns today for regular follow-up w/ PSA check. He reports stable urinary sx's, mostly c/o 2-3x nocturia. He is not making any efforts to limit his evening fluid intake. He has no issues with weak stream during the day but does note some weak stream during the night. He does not limit sodium intake in his diet. He thinks his bladder/sense of urgency is what is causing him to wake up during the night but he is not sure. He continues on tamsulosin but has been off finasteride for some time now. Overall, he is tolerating his urination well.   IPSS Questionnaire (AUA-7): Over the past month.   1)  How often have you had a sensation of not emptying your bladder completely after you finish urinating?  2 - Less than half the time  2)  How often have you had to urinate again less than two hours after you finished urinating? 4 - More than half the time  3)  How often have you found you stopped and started again several times when you urinated?  4 - More than half the time  4) How difficult have you found it to postpone urination?  4 - More than half the time  5) How often have you had a weak urinary stream?  5 - Almost always  6) How often have you had to push or strain to begin urination?  1 - Less than 1 time in 5  7) How many times did you most typically get up to urinate from the time you went to bed until the time you got up in the morning?  3 - 3 times  Total score:  0-7 mildly symptomatic   8-19 moderately symptomatic  23 20-35 severely symptomatic   QoL: 2  (below copied from [facility] records):   Elevated PSA  [name] is a 79 year-old male established patient who is here for an elevated PSA.  His PSA is 6. He has not had a prostate nodule on a physical examination. He has not had recurrent prostate infections or chronic prostatitis.   He has not been on  antibiotics for prostate infections previously. He has had a prostate biopsy done. His first prostate biopsy was done approximately 09/19/2010. He does have the pathology report from his biopsy.   He has a history of an elevated PSA with a negative biopsy in 2011. His PSA was 4.87 prior to the biopsy but declined on finasteride to 3.17 but he is off of that and the level has been rising slowly   04/24/19: [name] returns today in f/u for his history of an elevated PSA with a negative biopsy in 2011. His PSA was back down to 6.4 from 9.7 prior to this visit.   He is currently on prednisone for gout.    01/27/19 10/23/18 10/17/17 09/18/16 09/06/15 03/02/15 04/03/14 06/13/12  PSA  Total PSA 6.4 ng/dl 9.7 ng/dl 5.3 ng/dl 5.8  4.66  5.64  5.20  4.36   Free PSA    1.0  0.56  0.58  0.64  0.62   % Free PSA  8 %  17  12  10  12  14     BPH  He is currently taking tamsulosin. He is not on new medications for symptoms of prostate enlargement.   He does have an abnormal sensation when needing to  urinate. He is not having problems getting his urine stream started. He does not have a good size and strength to his urinary stream. He is not having problems with emptying his bladder well. He does not dribble at the end of urination.   04/24/19: He has stable moderate to severe LUTS with an IPSS of 20 on tamsulosin. he has nocturia x 3 and a reduced stream. He had been on finasteride remotely. He has no associated signs or symptoms.    Past Medical History:  Diagnosis Date  . Barrett's esophagus    short-segment, diagnosed in 2007 by Dr. [name]  . BPH (benign prostatic hyperplasia)   . CAD (coronary artery disease)    a. cath on 07/21/2019 showing patent LM and LCx with 40-60% Proximal LAD and 30-40% Pro-RCA stenosis with normal LV function.  [name] GERD (gastroesophageal reflux disease)   . Hiatal hernia    small  . HTN (hypertension)   . PAF (paroxysmal atrial fibrillation) ([place])   . S/P colonoscopy 2007    unremarkable  . S/P endoscopy Oct 2011    Salmon-colored epithelium coming up to 37 cm from the   . Vertigo     Past Surgical History:  Procedure Laterality Date  . APPENDECTOMY    . CHOLECYSTECTOMY    . COLONOSCOPY  02/06/2006   Dr. [name]- single anal papilla, o/w normal rectum, normal colon  . COLONOSCOPY N/A 09/13/2016   [facility]: three 5-6 mm for adenomas removed.  Recommend one last surveillance colonoscopy in October 2020.  [name] ESOPHAGOGASTRODUODENOSCOPY  09/15/2010   Dr. [name]- Salmon-colored epithelium coming up to 37 cm from the   . ESOPHAGOGASTRODUODENOSCOPY N/A 07/02/2013   Dr. [name]- Barretts on bx. hiatal hernia  . ESOPHAGOGASTRODUODENOSCOPY N/A 09/13/2016   [facility]: Barrett's esophagus without dysplasia. one last EGD in 3 years.   . INTRAVASCULAR PRESSURE WIRE/FFR STUDY N/A 07/21/2019   Procedure: INTRAVASCULAR PRESSURE WIRE/FFR STUDY;  Surgeon: [name], MD;  Location: [facility];  Service: Cardiovascular;  Laterality: N/A;  . LEFT HEART CATH AND CORONARY ANGIOGRAPHY N/A 07/21/2019   Procedure: LEFT HEART CATH AND CORONARY ANGIOGRAPHY;  Surgeon: [name], MD;  Location: [facility];  Service: Cardiovascular;  Laterality: N/A;  . ROTATOR CUFF REPAIR    . YAG LASER APPLICATION Right [id number]   Procedure: YAG LASER APPLICATION;  Surgeon: [name], MD;  Location: [facility];  Service: Ophthalmology;  Laterality: Right;    Home Medications:  Allergies as of 12/01/2019   No Known Allergies     Medication List       Accurate as of December 01, 2019  4:40 PM. If you have any questions, ask your nurse or doctor.        allopurinol 100 MG tablet Commonly known as: ZYLOPRIM Take 100 mg by mouth daily.   ALPRAZolam 1 MG tablet Commonly known as: XANAX Take 0.5 mg by mouth 2 (two) times daily as needed for anxiety or sleep.   atorvastatin 40 MG tablet Commonly known as: LIPITOR Take 40 mg by mouth daily.   Co Q10 100 MG Caps Take by mouth daily.     DULoxetine 30 MG capsule Commonly known as: CYMBALTA Take 30 mg by mouth daily.   fexofenadine 180 MG tablet Commonly known as: ALLEGRA Take 180 mg by mouth daily.   gabapentin 300 MG capsule Commonly known as: NEURONTIN Take 300 mg by mouth 2 (two) times daily.   glipiZIDE 5 MG tablet Commonly known  as: GLUCOTROL Take 5 mg by mouth daily before breakfast.   metoprolol succinate 25 MG 24 hr tablet Commonly known as: TOPROL-XL Take 25 mg by mouth daily.   pantoprazole 40 MG tablet Commonly known as: PROTONIX Take 1 tablet (40 mg total) by mouth 2 (two) times daily before a meal.   polyethylene glycol-electrolytes 420 g solution Commonly known as: TriLyte Take 4,000 mLs by mouth as directed.   tamsulosin 0.4 MG Caps capsule Commonly known as: FLOMAX Take 0.4 mg by mouth 2 (two) times daily.   warfarin 5 MG tablet Commonly known as: COUMADIN Take as directed by the anticoagulation clinic. If you are unsure how to take this medication, talk to your nurse or doctor. Original instructions: TAKE ONE TABLET BY MOUTH DAILY EXCEPT ONE-HALF TABLET ON TUESDAYS AND FRIDAYS OR AS DIRECTED       Allergies: No Known Allergies  Family History  Problem Relation Age of Onset  . Prostate cancer Father        deceased  . Stroke Mother        deceased  . Colon cancer Neg Hx     Social History:  reports that he quit smoking about 26 years ago. His smoking use included cigarettes. He has a 45.00 pack-year smoking history. He has never used smokeless tobacco. He reports that he does not drink alcohol or use drugs.  ROS: A complete review of systems was performed.  All systems are negative except for pertinent findings as noted.  Physical Exam:  Vital signs in last 24 hours: BP 118/73   Pulse 63   Temp (!) 96.8 F (36 C)   Ht 5\' 10"  (1.778 m)   Wt 212 lb (96.2 kg)   BMI 30.42 kg/m  Constitutional:  Alert and oriented, No acute distress, Pitting edema on  legs. Cardiovascular: Regular rate  Respiratory: Normal respiratory effort GI: Abdomen is soft, nontender, nondistended, no abdominal masses. No CVAT.  Genitourinary: Normal male phallus, testes are descended bilaterally and non-tender and without masses, scrotum is normal in appearance without lesions or masses, perineum is normal on inspection. Prostate feels 80 grams in size, no nodules.  Lymphatic: No lymphadenopathy Neurologic: Grossly intact, no focal deficits Psychiatric: Normal mood and affect  Laboratory Data:  No results for input(s): WBC, HGB, HCT, PLT in the last 72 hours.  No results for input(s): NA, K, CL, GLUCOSE, BUN, CALCIUM, CREATININE in the last 72 hours.  Invalid input(s): CO3   No results found for this or any previous visit (from the past 24 hour(s)). No results found for this or any previous visit (from the past 240 hour(s)).  Renal Function: No results for input(s): CREATININE in the last 168 hours. CrCl cannot be calculated (Patient's most recent lab result is older than the maximum 21 days allowed.).  Radiologic Imaging: No results found.   Old records/labs reviewed   Impression/Assessment:  Prostate exam normal. He does not seem to have had a recent PSA measurement.   Nocturnal polyuria--probably from multiple factors Plan:  1. Advised that limiting salt in diet, wearing compression garments, and decreasing evening fluid intake would help w/ his nighttime frequency.   2. PSA today -- will forward results.   3. We will refill meds when needed - no need to change medical regiment at this time.  4. If PSA normal, he will return for OV in 1 yr.

## 2019-12-04 ENCOUNTER — Other Ambulatory Visit: Payer: Self-pay | Admitting: Urology

## 2019-12-04 DIAGNOSIS — R972 Elevated prostate specific antigen [PSA]: Secondary | ICD-10-CM | POA: Diagnosis not present

## 2019-12-04 LAB — PSA: PSA: 6.3 ng/mL — ABNORMAL HIGH (ref <=4.0)

## 2019-12-07 ENCOUNTER — Other Ambulatory Visit: Payer: Self-pay

## 2019-12-07 ENCOUNTER — Ambulatory Visit (INDEPENDENT_AMBULATORY_CARE_PROVIDER_SITE_OTHER): Payer: Medicare Other | Admitting: *Deleted

## 2019-12-07 DIAGNOSIS — I4891 Unspecified atrial fibrillation: Secondary | ICD-10-CM

## 2019-12-07 DIAGNOSIS — Z5181 Encounter for therapeutic drug level monitoring: Secondary | ICD-10-CM | POA: Diagnosis not present

## 2019-12-07 LAB — POCT INR: INR: 1.2 — AB (ref 2.0–3.0)

## 2019-12-07 NOTE — Patient Instructions (Signed)
Take 1 1/2 tablets tonight and tomorrow night then increase dose to 1 tablet daily Continue greens or salads Call us with any new medications, changes or any bleeding concerns @ [phone].  Recheck in 1 week

## 2019-12-14 ENCOUNTER — Ambulatory Visit (INDEPENDENT_AMBULATORY_CARE_PROVIDER_SITE_OTHER): Payer: Medicare Other | Admitting: *Deleted

## 2019-12-14 ENCOUNTER — Other Ambulatory Visit: Payer: Self-pay

## 2019-12-14 DIAGNOSIS — Z5181 Encounter for therapeutic drug level monitoring: Secondary | ICD-10-CM

## 2019-12-14 DIAGNOSIS — I4891 Unspecified atrial fibrillation: Secondary | ICD-10-CM | POA: Diagnosis not present

## 2019-12-14 LAB — POCT INR: INR: 1.7 — AB (ref 2.0–3.0)

## 2019-12-14 NOTE — Patient Instructions (Signed)
Take 1 1/2 tablets tonight then increase dose to 1 tablet daily Continue greens or salads Call us with any new medications, changes or any bleeding concerns @ [phone].  Recheck in 2 weeks

## 2019-12-16 DIAGNOSIS — E1142 Type 2 diabetes mellitus with diabetic polyneuropathy: Secondary | ICD-10-CM | POA: Diagnosis not present

## 2019-12-16 DIAGNOSIS — G2581 Restless legs syndrome: Secondary | ICD-10-CM | POA: Diagnosis not present

## 2019-12-16 DIAGNOSIS — M545 Low back pain: Secondary | ICD-10-CM | POA: Diagnosis not present

## 2019-12-16 DIAGNOSIS — G894 Chronic pain syndrome: Secondary | ICD-10-CM | POA: Diagnosis not present

## 2019-12-17 ENCOUNTER — Telehealth: Payer: Self-pay | Admitting: Urology

## 2019-12-17 NOTE — Telephone Encounter (Signed)
Patient called and requested a return call from the nurse. He has a medication question. [phone]

## 2019-12-17 NOTE — Telephone Encounter (Signed)
Contacted pt. Pt meant to call cardiology. Pt states he left a message with the cardiologist office.

## 2019-12-18 ENCOUNTER — Telehealth: Payer: Self-pay | Admitting: Cardiovascular Disease

## 2019-12-18 NOTE — Telephone Encounter (Signed)
Prednisone 20 mg - his doctor prescribed him this medication to take to help with pain in his back.  He doesn't want to start until he speaks with someone about how it will affect his blood level

## 2019-12-18 NOTE — Telephone Encounter (Signed)
Returned call to the pt and he states he is having back pain and need to take prednisone for the pain. Advised that prednisone causes the INR to increase and that we would have to check the INR sooner since it could make the INR higher/thin his blood. Pt agreeable to beginning the Prednisone and today and being checked on Monday. He stated that this medication would help since his INR has been low and advised that we do not want the INR too high cause the risk of bleeding and bruising increases. Advised that if INR gets high it can cause bleeding and he needs to see a medical professional if this happens.   Advised that I needed to know the dosing of the Prednisone and he states it is 20mg  tablet, asked how is he taking it and was able to get the instructions: 20mg  tablet taking 3 tablets for 5 days, 2 tablets for 5 days, then 2 tablets until complete. Pt advised to have more leafy salads to keep INR form going to high since he eats those and then on Monday we will give him further dosing instructions after checking INR in the office. Appt set for Monday at 245pm in [place].

## 2019-12-20 DIAGNOSIS — K2270 Barrett's esophagus without dysplasia: Secondary | ICD-10-CM | POA: Diagnosis not present

## 2019-12-20 DIAGNOSIS — I480 Paroxysmal atrial fibrillation: Secondary | ICD-10-CM | POA: Diagnosis not present

## 2019-12-20 DIAGNOSIS — E7849 Other hyperlipidemia: Secondary | ICD-10-CM | POA: Diagnosis not present

## 2019-12-20 DIAGNOSIS — E119 Type 2 diabetes mellitus without complications: Secondary | ICD-10-CM | POA: Diagnosis not present

## 2019-12-21 ENCOUNTER — Other Ambulatory Visit: Payer: Self-pay

## 2019-12-21 ENCOUNTER — Ambulatory Visit (INDEPENDENT_AMBULATORY_CARE_PROVIDER_SITE_OTHER): Payer: Medicare Other | Admitting: *Deleted

## 2019-12-21 DIAGNOSIS — I4891 Unspecified atrial fibrillation: Secondary | ICD-10-CM

## 2019-12-21 DIAGNOSIS — Z5181 Encounter for therapeutic drug level monitoring: Secondary | ICD-10-CM | POA: Diagnosis not present

## 2019-12-21 LAB — POCT INR: INR: 2.2 (ref 2.0–3.0)

## 2019-12-21 NOTE — Patient Instructions (Signed)
Continue warfarin 1 tablet daily Continue prednisone taper Continue greens or salads Call us with any new medications, changes or any bleeding concerns @ [phone].  Recheck in 1 week

## 2019-12-28 ENCOUNTER — Ambulatory Visit (INDEPENDENT_AMBULATORY_CARE_PROVIDER_SITE_OTHER): Payer: Medicare Other | Admitting: *Deleted

## 2019-12-28 ENCOUNTER — Other Ambulatory Visit: Payer: Self-pay

## 2019-12-28 DIAGNOSIS — I4891 Unspecified atrial fibrillation: Secondary | ICD-10-CM | POA: Diagnosis not present

## 2019-12-28 DIAGNOSIS — Z5181 Encounter for therapeutic drug level monitoring: Secondary | ICD-10-CM

## 2019-12-28 LAB — POCT INR: INR: 2.1 (ref 2.0–3.0)

## 2019-12-28 NOTE — Patient Instructions (Signed)
Continue warfarin 1 tablet daily Finished prednisone taper tomorrow Continue greens or salads Call us with any new medications, changes or any bleeding concerns @ [phone].  Recheck in 4 weeks

## 2020-01-13 ENCOUNTER — Other Ambulatory Visit: Payer: Self-pay | Admitting: Neurology

## 2020-01-13 DIAGNOSIS — G894 Chronic pain syndrome: Secondary | ICD-10-CM | POA: Diagnosis not present

## 2020-01-13 DIAGNOSIS — M545 Low back pain: Secondary | ICD-10-CM | POA: Diagnosis not present

## 2020-01-13 DIAGNOSIS — G2581 Restless legs syndrome: Secondary | ICD-10-CM | POA: Diagnosis not present

## 2020-01-13 DIAGNOSIS — M533 Sacrococcygeal disorders, not elsewhere classified: Secondary | ICD-10-CM

## 2020-01-13 DIAGNOSIS — E1142 Type 2 diabetes mellitus with diabetic polyneuropathy: Secondary | ICD-10-CM | POA: Diagnosis not present

## 2020-01-14 ENCOUNTER — Telehealth: Payer: Self-pay | Admitting: *Deleted

## 2020-01-14 ENCOUNTER — Telehealth: Payer: Self-pay | Admitting: Cardiovascular Disease

## 2020-01-14 NOTE — Telephone Encounter (Signed)
Pt left voicemail stating he's supposed to have back surgery done next week and the office was faxing over the clearance to Dr. Bronson Ing

## 2020-01-14 NOTE — Telephone Encounter (Signed)
Went over coumadin instructions with pt.  He will take last dose of coumadin 2/26 and come for INR check on 01/19/20.  Back injection is on 01/20/20

## 2020-01-14 NOTE — Telephone Encounter (Signed)
Will await clearance and give to Dr. Bronson Ing.

## 2020-01-14 NOTE — Telephone Encounter (Signed)
Please give pt a call about his apt, he's needing to have back injections   [phone]

## 2020-01-15 NOTE — Telephone Encounter (Signed)
We have not received any clearance from dr.rourk's office. The patient will call Monday to inquire.

## 2020-01-17 DIAGNOSIS — E119 Type 2 diabetes mellitus without complications: Secondary | ICD-10-CM | POA: Diagnosis not present

## 2020-01-17 DIAGNOSIS — E7849 Other hyperlipidemia: Secondary | ICD-10-CM | POA: Diagnosis not present

## 2020-01-17 DIAGNOSIS — K2270 Barrett's esophagus without dysplasia: Secondary | ICD-10-CM | POA: Diagnosis not present

## 2020-01-17 DIAGNOSIS — I480 Paroxysmal atrial fibrillation: Secondary | ICD-10-CM | POA: Diagnosis not present

## 2020-01-19 ENCOUNTER — Telehealth: Payer: Self-pay | Admitting: *Deleted

## 2020-01-19 ENCOUNTER — Other Ambulatory Visit: Payer: Self-pay | Admitting: *Deleted

## 2020-01-19 ENCOUNTER — Other Ambulatory Visit: Payer: Self-pay

## 2020-01-19 ENCOUNTER — Ambulatory Visit (INDEPENDENT_AMBULATORY_CARE_PROVIDER_SITE_OTHER): Payer: Medicare Other | Admitting: *Deleted

## 2020-01-19 ENCOUNTER — Telehealth: Payer: Self-pay

## 2020-01-19 DIAGNOSIS — I4891 Unspecified atrial fibrillation: Secondary | ICD-10-CM | POA: Diagnosis not present

## 2020-01-19 DIAGNOSIS — Z5181 Encounter for therapeutic drug level monitoring: Secondary | ICD-10-CM | POA: Diagnosis not present

## 2020-01-19 DIAGNOSIS — D649 Anemia, unspecified: Secondary | ICD-10-CM

## 2020-01-19 DIAGNOSIS — Z79899 Other long term (current) drug therapy: Secondary | ICD-10-CM

## 2020-01-19 LAB — POCT INR: INR: 1.5 — AB (ref 2.0–3.0)

## 2020-01-19 NOTE — Telephone Encounter (Signed)
Lab order released and mailed to pt.

## 2020-01-19 NOTE — Patient Instructions (Signed)
Scheduled for back injection tomorrow. Restart warfarin tomorrow night.  Take warfarin 1 1/2 tablets x 3 days (3/3, 3/4, 3/5).  Take 1 tablet on Friday 3/6 then stop for colonoscopy on 3/11.  Restart warfarin 1 1/2 tablets x 2 days on 3/11 & 3/12 then resume 1 tablet daily. Recheck INR on 02/04/20 Call us with any new medications, changes or any bleeding concerns @ [phone].

## 2020-01-19 NOTE — Telephone Encounter (Signed)
That would be fine 

## 2020-01-19 NOTE — Telephone Encounter (Signed)
Pt asked if Dr.Koneswaren will approve colonoscopy he is having with Dr.Roark.   Thanks renee

## 2020-01-19 NOTE — Telephone Encounter (Signed)
Will forward to Dr. Koneswaran to advise.  

## 2020-01-20 ENCOUNTER — Ambulatory Visit
Admission: RE | Admit: 2020-01-20 | Discharge: 2020-01-20 | Disposition: A | Discharge status: Home / Self Care | Payer: Medicare Other | Source: Ambulatory Visit | Attending: Neurology | Admitting: Neurology

## 2020-01-20 DIAGNOSIS — M5431 Sciatica, right side: Secondary | ICD-10-CM | POA: Diagnosis not present

## 2020-01-20 DIAGNOSIS — M533 Sacrococcygeal disorders, not elsewhere classified: Secondary | ICD-10-CM

## 2020-01-20 MED ORDER — METHYLPREDNISOLONE ACETATE 40 MG/ML INJ SUSP (RADIOLOG
120.0000 mg | Freq: Once | INTRAMUSCULAR | Status: AC
  Administered 2020-01-20: 120 mg via INTRA_ARTICULAR

## 2020-01-20 MED ORDER — IOPAMIDOL (ISOVUE-M 200) INJECTION 41%
1.0000 mL | Freq: Once | INTRAMUSCULAR | Status: AC
  Administered 2020-01-20: 1 mL via INTRA_ARTICULAR

## 2020-01-20 NOTE — Discharge Instructions (Signed)

## 2020-01-20 NOTE — Telephone Encounter (Signed)
Pt notified and voiced understanding 

## 2020-01-22 NOTE — Patient Instructions (Signed)
[name]  01/22/2020     @PREFPERIOPPHARMACY @   Your procedure is scheduled on  01/28/2020.  Report to [facility] at  0900  A.M.  Call this number if you have problems the morning of surgery:  [phone]   Remember:  Follow the diet and prep instructions given to you by Dr Roseanne Kaufman office.                       Take these medicines the morning of surgery with A SIP OF WATER allopurinol, gabapentin, protonix, flomax.    Do not wear jewelry, make-up or nail polish.  Do not wear lotions, powders, or perfumes. Please wear deodorant and brush your teeth.  Do not shave 48 hours prior to surgery.  Men may shave face and neck.  Do not bring valuables to the hospital.  [facility] is not responsible for any belongings or valuables.  Contacts, dentures or bridgework may not be worn into surgery.  Leave your suitcase in the car.  After surgery it may be brought to your room.  For patients admitted to the hospital, discharge time will be determined by your treatment team.  Patients discharged the day of surgery will not be allowed to drive home.   Name and phone number of your driver:   family Special instructions:  None  Please read over the following fact sheets that you were given. Anesthesia Post-op Instructions and Care and Recovery After Surgery       Upper Endoscopy, Adult, Care After This sheet gives you information about how to care for yourself after your procedure. Your health care provider may also give you more specific instructions. If you have problems or questions, contact your health care provider. What can I expect after the procedure? After the procedure, it is common to have:  A sore throat.  Mild stomach pain or discomfort.  Bloating.  Nausea. Follow these instructions at home:   Follow instructions from your health care provider about what to eat or drink after your procedure.  Return to your normal activities as told by your health  care provider. Ask your health care provider what activities are safe for you.  Take over-the-counter and prescription medicines only as told by your health care provider.  Do not drive for 24 hours if you were given a sedative during your procedure.  Keep all follow-up visits as told by your health care provider. This is important. Contact a health care provider if you have:  A sore throat that lasts longer than one day.  Trouble swallowing. Get help right away if:  You vomit blood or your vomit looks like coffee grounds.  You have: ? A fever. ? Bloody, black, or tarry stools. ? A severe sore throat or you cannot swallow. ? Difficulty breathing. ? Severe pain in your chest or abdomen. Summary  After the procedure, it is common to have a sore throat, mild stomach discomfort, bloating, and nausea.  Do not drive for 24 hours if you were given a sedative during the procedure.  Follow instructions from your health care provider about what to eat or drink after your procedure.  Return to your normal activities as told by your health care provider. This information is not intended to replace advice given to you by your health care provider. Make sure you discuss any questions you have with your health care provider. Document Revised: 04/29/2018 Document Reviewed: 04/07/2018  Elsevier Patient Education  [employer].  Colonoscopy, Adult, Care After This sheet gives you information about how to care for yourself after your procedure. Your health care provider may also give you more specific instructions. If you have problems or questions, contact your health care provider. What can I expect after the procedure? After the procedure, it is common to have:  A small amount of blood in your stool for 24 hours after the procedure.  Some gas.  Mild cramping or bloating of your abdomen. Follow these instructions at home: Eating and drinking   Drink enough fluid to keep your  urine pale yellow.  Follow instructions from your health care provider about eating or drinking restrictions.  Resume your normal diet as instructed by your health care provider. Avoid heavy or fried foods that are hard to digest. Activity  Rest as told by your health care provider.  Avoid sitting for a long time without moving. Get up to take short walks every 1-2 hours. This is important to improve blood flow and breathing. Ask for help if you feel weak or unsteady.  Return to your normal activities as told by your health care provider. Ask your health care provider what activities are safe for you. Managing cramping and bloating   Try walking around when you have cramps or feel bloated.  Apply heat to your abdomen as told by your health care provider. Use the heat source that your health care provider recommends, such as a moist heat pack or a heating pad. ? Place a towel between your skin and the heat source. ? Leave the heat on for 20-30 minutes. ? Remove the heat if your skin turns bright red. This is especially important if you are unable to feel pain, heat, or cold. You may have a greater risk of getting burned. General instructions  For the first 24 hours after the procedure: ? Do not drive or use machinery. ? Do not sign important documents. ? Do not drink alcohol. ? Do your regular daily activities at a slower pace than normal. ? Eat soft foods that are easy to digest.  Take over-the-counter and prescription medicines only as told by your health care provider.  Keep all follow-up visits as told by your health care provider. This is important. Contact a health care provider if:  You have blood in your stool 2-3 days after the procedure. Get help right away if you have:  More than a small spotting of blood in your stool.  Large blood clots in your stool.  Swelling of your abdomen.  Nausea or vomiting.  A fever.  Increasing pain in your abdomen that is not  relieved with medicine. Summary  After the procedure, it is common to have a small amount of blood in your stool. You may also have mild cramping and bloating of your abdomen.  For the first 24 hours after the procedure, do not drive or use machinery, sign important documents, or drink alcohol.  Get help right away if you have a lot of blood in your stool, nausea or vomiting, a fever, or increased pain in your abdomen. This information is not intended to replace advice given to you by your health care provider. Make sure you discuss any questions you have with your health care provider. Document Revised: 06/01/2019 Document Reviewed: 06/01/2019 Elsevier Patient Education  [place] After These instructions provide you with information about caring for yourself after your procedure. Your health  care provider may also give you more specific instructions. Your treatment has been planned according to current medical practices, but problems sometimes occur. Call your health care provider if you have any problems or questions after your procedure. What can I expect after the procedure? After your procedure, you may:  Feel sleepy for several hours.  Feel clumsy and have poor balance for several hours.  Feel forgetful about what happened after the procedure.  Have poor judgment for several hours.  Feel nauseous or vomit.  Have a sore throat if you had a breathing tube during the procedure. Follow these instructions at home: For at least 24 hours after the procedure:      Have a responsible adult stay with you. It is important to have someone help care for you until you are awake and alert.  Rest as needed.  Do not: ? Participate in activities in which you could fall or become injured. ? Drive. ? Use heavy machinery. ? Drink alcohol. ? Take sleeping pills or medicines that cause drowsiness. ? Make important decisions or sign legal  documents. ? Take care of children on your own. Eating and drinking  Follow the diet that is recommended by your health care provider.  If you vomit, drink water, juice, or soup when you can drink without vomiting.  Make sure you have little or no nausea before eating solid foods. General instructions  Take over-the-counter and prescription medicines only as told by your health care provider.  If you have sleep apnea, surgery and certain medicines can increase your risk for breathing problems. Follow instructions from your health care provider about wearing your sleep device: ? Anytime you are sleeping, including during daytime naps. ? While taking prescription pain medicines, sleeping medicines, or medicines that make you drowsy.  If you smoke, do not smoke without supervision.  Keep all follow-up visits as told by your health care provider. This is important. Contact a health care provider if:  You keep feeling nauseous or you keep vomiting.  You feel light-headed.  You develop a rash.  You have a fever. Get help right away if:  You have trouble breathing. Summary  For several hours after your procedure, you may feel sleepy and have poor judgment.  Have a responsible adult stay with you for at least 24 hours or until you are awake and alert. This information is not intended to replace advice given to you by your health care provider. Make sure you discuss any questions you have with your health care provider. Document Revised: 02/03/2018 Document Reviewed: 02/26/2016 Elsevier Patient Education  [employer].

## 2020-01-25 ENCOUNTER — Telehealth: Payer: Self-pay | Admitting: Internal Medicine

## 2020-01-25 NOTE — Telephone Encounter (Signed)
Pt said his prep was on back order. He uses [facility] and is scheduled with [facility] for 01/28/2020. [phone]

## 2020-01-25 NOTE — Telephone Encounter (Signed)
Called pt and he is going to come by the office to p/u miralax prep instructions.

## 2020-01-25 NOTE — Telephone Encounter (Signed)
Patient pharmacy called and they do not have his prep in stock

## 2020-01-25 NOTE — Telephone Encounter (Signed)
Duplicate message. See prior note 

## 2020-01-26 ENCOUNTER — Other Ambulatory Visit (HOSPITAL_COMMUNITY)
Admission: RE | Admit: 2020-01-26 | Discharge: 2020-01-26 | Disposition: A | Discharge status: Home / Self Care | Payer: Medicare Other | Source: Ambulatory Visit | Attending: Internal Medicine | Admitting: Internal Medicine

## 2020-01-26 ENCOUNTER — Other Ambulatory Visit: Payer: Self-pay

## 2020-01-26 ENCOUNTER — Encounter (HOSPITAL_COMMUNITY)
Admission: RE | Admit: 2020-01-26 | Discharge: 2020-01-26 | Disposition: A | Discharge status: Home / Self Care | Payer: Medicare Other | Source: Ambulatory Visit | Attending: Internal Medicine | Admitting: Internal Medicine

## 2020-01-26 DIAGNOSIS — Z01812 Encounter for preprocedural laboratory examination: Secondary | ICD-10-CM | POA: Diagnosis not present

## 2020-01-26 DIAGNOSIS — Z20822 Contact with and (suspected) exposure to covid-19: Secondary | ICD-10-CM | POA: Diagnosis not present

## 2020-01-26 LAB — CBC WITH DIFFERENTIAL/PLATELET
Abs Immature Granulocytes: 0.08 10*3/uL — ABNORMAL HIGH (ref 0.00–0.07)
Basophils Absolute: 0 10*3/uL (ref 0.0–0.1)
Basophils Relative: 0 %
Eosinophils Absolute: 0.1 10*3/uL (ref 0.0–0.5)
Eosinophils Relative: 1 %
HCT: 41.2 % (ref 39.0–52.0)
Hemoglobin: 14.2 g/dL (ref 13.0–17.0)
Immature Granulocytes: 1 %
Lymphocytes Relative: 16 %
Lymphs Abs: 1.1 10*3/uL (ref 0.7–4.0)
MCH: 32.3 pg (ref 26.0–34.0)
MCHC: 34.5 g/dL (ref 30.0–36.0)
MCV: 93.6 fL (ref 80.0–100.0)
Monocytes Absolute: 0.7 10*3/uL (ref 0.1–1.0)
Monocytes Relative: 10 %
Neutro Abs: 4.9 10*3/uL (ref 1.7–7.7)
Neutrophils Relative %: 72 %
Platelets: 192 10*3/uL (ref 150–400)
RBC: 4.4 MIL/uL (ref 4.22–5.81)
RDW: 14.1 % (ref 11.5–15.5)
WBC: 6.9 10*3/uL (ref 4.0–10.5)
nRBC: 0 % (ref 0.0–0.2)

## 2020-01-26 LAB — BASIC METABOLIC PANEL
Anion gap: 9 (ref 5–15)
BUN: 19 mg/dL (ref 8–23)
CO2: 25 mmol/L (ref 22–32)
Calcium: 8.8 mg/dL — ABNORMAL LOW (ref 8.9–10.3)
Chloride: 101 mmol/L (ref 98–111)
Creatinine, Ser: 1.06 mg/dL (ref 0.61–1.24)
GFR calc Af Amer: >60 mL/min (ref >60)
GFR calc non Af Amer: >60 mL/min (ref >60)
Glucose, Bld: 301 mg/dL — ABNORMAL HIGH (ref 70–99)
Potassium: 3.7 mmol/L (ref 3.5–5.1)
Sodium: 135 mmol/L (ref 135–145)

## 2020-01-27 LAB — SARS CORONAVIRUS 2 (TAT 6-24 HRS): SARS Coronavirus 2: NEGATIVE

## 2020-01-28 ENCOUNTER — Ambulatory Visit (HOSPITAL_COMMUNITY): Payer: Medicare Other | Admitting: Anesthesiology

## 2020-01-28 ENCOUNTER — Ambulatory Visit (HOSPITAL_COMMUNITY)
Admission: RE | Admit: 2020-01-28 | Discharge: 2020-01-28 | Disposition: A | Discharge status: Home / Self Care | Payer: Medicare Other | Attending: Internal Medicine | Admitting: Internal Medicine

## 2020-01-28 ENCOUNTER — Encounter: Payer: Self-pay | Admitting: Internal Medicine

## 2020-01-28 ENCOUNTER — Encounter (HOSPITAL_COMMUNITY): Payer: Self-pay | Admitting: Internal Medicine

## 2020-01-28 ENCOUNTER — Encounter (HOSPITAL_COMMUNITY)
Admission: RE | Disposition: A | Discharge status: Home / Self Care | Payer: Self-pay | Source: Home / Self Care | Attending: Internal Medicine

## 2020-01-28 DIAGNOSIS — Z87891 Personal history of nicotine dependence: Secondary | ICD-10-CM | POA: Insufficient documentation

## 2020-01-28 DIAGNOSIS — I480 Paroxysmal atrial fibrillation: Secondary | ICD-10-CM | POA: Insufficient documentation

## 2020-01-28 DIAGNOSIS — I2510 Atherosclerotic heart disease of native coronary artery without angina pectoris: Secondary | ICD-10-CM | POA: Diagnosis not present

## 2020-01-28 DIAGNOSIS — Z86010 Personal history of colonic polyps: Secondary | ICD-10-CM | POA: Insufficient documentation

## 2020-01-28 DIAGNOSIS — Z79899 Other long term (current) drug therapy: Secondary | ICD-10-CM | POA: Diagnosis not present

## 2020-01-28 DIAGNOSIS — K2270 Barrett's esophagus without dysplasia: Secondary | ICD-10-CM | POA: Diagnosis not present

## 2020-01-28 DIAGNOSIS — Z7984 Long term (current) use of oral hypoglycemic drugs: Secondary | ICD-10-CM | POA: Diagnosis not present

## 2020-01-28 DIAGNOSIS — Z7901 Long term (current) use of anticoagulants: Secondary | ICD-10-CM | POA: Insufficient documentation

## 2020-01-28 DIAGNOSIS — K5730 Diverticulosis of large intestine without perforation or abscess without bleeding: Secondary | ICD-10-CM | POA: Insufficient documentation

## 2020-01-28 DIAGNOSIS — I4891 Unspecified atrial fibrillation: Secondary | ICD-10-CM | POA: Diagnosis not present

## 2020-01-28 DIAGNOSIS — Z8719 Personal history of other diseases of the digestive system: Secondary | ICD-10-CM | POA: Diagnosis not present

## 2020-01-28 DIAGNOSIS — D129 Benign neoplasm of anus and anal canal: Secondary | ICD-10-CM | POA: Diagnosis not present

## 2020-01-28 DIAGNOSIS — K635 Polyp of colon: Secondary | ICD-10-CM | POA: Diagnosis not present

## 2020-01-28 DIAGNOSIS — R933 Abnormal findings on diagnostic imaging of other parts of digestive tract: Secondary | ICD-10-CM | POA: Diagnosis not present

## 2020-01-28 DIAGNOSIS — K449 Diaphragmatic hernia without obstruction or gangrene: Secondary | ICD-10-CM | POA: Insufficient documentation

## 2020-01-28 DIAGNOSIS — Z1211 Encounter for screening for malignant neoplasm of colon: Secondary | ICD-10-CM | POA: Diagnosis not present

## 2020-01-28 DIAGNOSIS — Z09 Encounter for follow-up examination after completed treatment for conditions other than malignant neoplasm: Secondary | ICD-10-CM | POA: Insufficient documentation

## 2020-01-28 DIAGNOSIS — D649 Anemia, unspecified: Secondary | ICD-10-CM

## 2020-01-28 DIAGNOSIS — I10 Essential (primary) hypertension: Secondary | ICD-10-CM | POA: Diagnosis not present

## 2020-01-28 DIAGNOSIS — D120 Benign neoplasm of cecum: Secondary | ICD-10-CM | POA: Insufficient documentation

## 2020-01-28 DIAGNOSIS — K219 Gastro-esophageal reflux disease without esophagitis: Secondary | ICD-10-CM | POA: Insufficient documentation

## 2020-01-28 HISTORY — PX: BIOPSY: SHX5522

## 2020-01-28 HISTORY — PX: POLYPECTOMY: SHX5525

## 2020-01-28 HISTORY — PX: ESOPHAGOGASTRODUODENOSCOPY (EGD) WITH PROPOFOL: SHX5813

## 2020-01-28 HISTORY — PX: COLONOSCOPY WITH PROPOFOL: SHX5780

## 2020-01-28 LAB — GLUCOSE, CAPILLARY
Glucose-Capillary: 190 mg/dL — ABNORMAL HIGH (ref 70–99)
Glucose-Capillary: 140 mg/dL — ABNORMAL HIGH (ref 70–99)

## 2020-01-28 SURGERY — COLONOSCOPY WITH PROPOFOL
Anesthesia: General

## 2020-01-28 MED ORDER — CHLORHEXIDINE GLUCONATE CLOTH 2 % EX PADS: 6.0000 | MEDICATED_PAD | Freq: Once | CUTANEOUS | Status: DC

## 2020-01-28 MED ORDER — PROPOFOL 10 MG/ML IV BOLUS
INTRAVENOUS | Status: DC | PRN
  Administered 2020-01-28: 20 mg via INTRAVENOUS

## 2020-01-28 MED ORDER — KETAMINE HCL 10 MG/ML IJ SOLN
INTRAMUSCULAR | Status: DC | PRN
  Administered 2020-01-28: 30 mg via INTRAVENOUS
  Administered 2020-01-28: 10 mg via INTRAVENOUS

## 2020-01-28 MED ORDER — PHENYLEPHRINE 40 MCG/ML (10ML) SYRINGE FOR IV PUSH (FOR BLOOD PRESSURE SUPPORT)
PREFILLED_SYRINGE | INTRAVENOUS | Status: AC
  Filled 2020-01-28: qty 10

## 2020-01-28 MED ORDER — PROPOFOL 10 MG/ML IV BOLUS
INTRAVENOUS | Status: AC
  Filled 2020-01-28: qty 20

## 2020-01-28 MED ORDER — KETAMINE HCL 50 MG/5ML IJ SOSY
PREFILLED_SYRINGE | INTRAMUSCULAR | Status: AC
  Filled 2020-01-28: qty 5

## 2020-01-28 MED ORDER — PROPOFOL 10 MG/ML IV BOLUS
INTRAVENOUS | Status: AC
  Filled 2020-01-28: qty 40

## 2020-01-28 MED ORDER — LACTATED RINGERS IV SOLN: INTRAVENOUS | Status: DC

## 2020-01-28 MED ORDER — PROPOFOL 500 MG/50ML IV EMUL
INTRAVENOUS | Status: DC | PRN
  Administered 2020-01-28: 125 ug/kg/min via INTRAVENOUS

## 2020-01-28 MED ORDER — LACTATED RINGERS IV SOLN: INTRAVENOUS | Status: DC | PRN

## 2020-01-28 MED ORDER — PHENYLEPHRINE 40 MCG/ML (10ML) SYRINGE FOR IV PUSH (FOR BLOOD PRESSURE SUPPORT)
PREFILLED_SYRINGE | INTRAVENOUS | Status: DC | PRN
  Administered 2020-01-28: 80 ug via INTRAVENOUS
  Administered 2020-01-28 (×2): 40 ug via INTRAVENOUS
  Administered 2020-01-28: 80 ug via INTRAVENOUS
  Administered 2020-01-28 (×2): 40 ug via INTRAVENOUS
  Administered 2020-01-28: 80 ug via INTRAVENOUS

## 2020-01-28 NOTE — Discharge Instructions (Signed)
Colonoscopy Discharge Instructions  Read the instructions outlined below and refer to this sheet in the next few weeks. These discharge instructions provide you with general information on caring for yourself after you leave the hospital. Your doctor may also give you specific instructions. While your treatment has been planned according to the most current medical practices available, unavoidable complications occasionally occur. If you have any problems or questions after discharge, call Dr. [name] at [phone]. ACTIVITY  You may resume your regular activity, but move at a slower pace for the next 24 hours.   Take frequent rest periods for the next 24 hours.   Walking will help get rid of the air and reduce the bloated feeling in your belly (abdomen).   No driving for 24 hours (because of the medicine (anesthesia) used during the test).    Do not sign any important legal documents or operate any machinery for 24 hours (because of the anesthesia used during the test).  NUTRITION  Drink plenty of fluids.   You may resume your normal diet as instructed by your doctor.   Begin with a light meal and progress to your normal diet. Heavy or fried foods are harder to digest and may make you feel sick to your stomach (nauseated).   Avoid alcoholic beverages for 24 hours or as instructed.  MEDICATIONS  You may resume your normal medications unless your doctor tells you otherwise.  WHAT YOU CAN EXPECT TODAY  Some feelings of bloating in the abdomen.   Passage of more gas than usual.   Spotting of blood in your stool or on the toilet paper.  IF YOU HAD POLYPS REMOVED DURING THE COLONOSCOPY:  No aspirin products for 7 days or as instructed.   No alcohol for 7 days or as instructed.   Eat a soft diet for the next 24 hours.  FINDING OUT THE RESULTS OF YOUR TEST Not all test results are available during your visit. If your test results are not back during the visit, make an appointment  with your caregiver to find out the results. Do not assume everything is normal if you have not heard from your caregiver or the medical facility. It is important for you to follow up on all of your test results.  SEEK IMMEDIATE MEDICAL ATTENTION IF:  You have more than a spotting of blood in your stool.   Your belly is swollen (abdominal distention).   You are nauseated or vomiting.   You have a temperature over 101.   You have abdominal pain or discomfort that is severe or gets worse throughout the day.   EGD Discharge instructions Please read the instructions outlined below and refer to this sheet in the next few weeks. These discharge instructions provide you with general information on caring for yourself after you leave the hospital. Your doctor may also give you specific instructions. While your treatment has been planned according to the most current medical practices available, unavoidable complications occasionally occur. If you have any problems or questions after discharge, please call your doctor. ACTIVITY  You may resume your regular activity but move at a slower pace for the next 24 hours.   Take frequent rest periods for the next 24 hours.   Walking will help expel (get rid of) the air and reduce the bloated feeling in your abdomen.   No driving for 24 hours (because of the anesthesia (medicine) used during the test).   You may shower.   Do not sign any  important legal documents or operate any machinery for 24 hours (because of the anesthesia used during the test).  NUTRITION  Drink plenty of fluids.   You may resume your normal diet.   Begin with a light meal and progress to your normal diet.   Avoid alcoholic beverages for 24 hours or as instructed by your caregiver.  MEDICATIONS  You may resume your normal medications unless your caregiver tells you otherwise.  WHAT YOU CAN EXPECT TODAY  You may experience abdominal discomfort such as a feeling of  fullness or "gas" pains.  FOLLOW-UP  Your doctor will discuss the results of your test with you.  SEEK IMMEDIATE MEDICAL ATTENTION IF ANY OF THE FOLLOWING OCCUR:  Excessive nausea (feeling sick to your stomach) and/or vomiting.   Severe abdominal pain and distention (swelling).   Trouble swallowing.   Temperature over 101 F (37.8 C).   Rectal bleeding or vomiting of blood.   May decrease Protonix to 40 mg once daily  Resume Coumadin today.  Office visit with [place] in 1 year  Further recommendations to follow pending review of pathology report  At patient request, I called Tregan Whitfield at [phone] and reviewed results.

## 2020-01-28 NOTE — H&P (Signed)
@LOGO @   Primary Care Physician:  [facility] Primary Gastroenterologist:  Dr. [name]  Pre-Procedure History & Physical: HPI:  [name] is a 79 y.o. male here for surveillance EGD given history of Barrett's esophagus and colonoscopy for colonic adenoma.  Had transient anemia and fatigue latter part of last year.  However hemoglobin rebounded spontaneously.  Normal H&H in January of this year.  Clinically no GI bleeding.  No dysphagia.  Early, no GI symptoms.  Reflux well controlled on Protonix 40 mg twice daily.  New diagnosis of atrial fibrillation.  Last Coumadin 4 days ago.  Past Medical History:  Diagnosis Date  . Barrett's esophagus    short-segment, diagnosed in 2007 by Dr. [name]  . BPH (benign prostatic hyperplasia)   . CAD (coronary artery disease)    a. cath on 07/21/2019 showing patent LM and LCx with 40-60% Proximal LAD and 30-40% Pro-RCA stenosis with normal LV function.  [name] GERD (gastroesophageal reflux disease)   . Hiatal hernia    small  . HTN (hypertension)   . PAF (paroxysmal atrial fibrillation) ([place])   . S/P colonoscopy 2007   unremarkable  . S/P endoscopy Oct 2011    Salmon-colored epithelium coming up to 37 cm from the   . Vertigo     Past Surgical History:  Procedure Laterality Date  . APPENDECTOMY    . CHOLECYSTECTOMY    . COLONOSCOPY  02/06/2006   Dr. [name]- single anal papilla, o/w normal rectum, normal colon  . COLONOSCOPY N/A 09/13/2016   RMR: three 5-6 mm for adenomas removed.  Recommend one last surveillance colonoscopy in October 2020.  [name] ESOPHAGOGASTRODUODENOSCOPY  09/15/2010   Dr. [name]- Salmon-colored epithelium coming up to 37 cm from the   . ESOPHAGOGASTRODUODENOSCOPY N/A 07/02/2013   Dr. [name]- Barretts on bx. hiatal hernia  . ESOPHAGOGASTRODUODENOSCOPY N/A 09/13/2016   RMR: Barrett's esophagus without dysplasia. one last EGD in 3 years.   . INTRAVASCULAR PRESSURE WIRE/FFR STUDY N/A 07/21/2019   Procedure:  INTRAVASCULAR PRESSURE WIRE/FFR STUDY;  Surgeon: [name], MD;  Location: [facility];  Service: Cardiovascular;  Laterality: N/A;  . LEFT HEART CATH AND CORONARY ANGIOGRAPHY N/A 07/21/2019   Procedure: LEFT HEART CATH AND CORONARY ANGIOGRAPHY;  Surgeon: [name], MD;  Location: [facility];  Service: Cardiovascular;  Laterality: N/A;  . ROTATOR CUFF REPAIR    . YAG LASER APPLICATION Right [id number]   Procedure: YAG LASER APPLICATION;  Surgeon: [name], MD;  Location: [facility];  Service: Ophthalmology;  Laterality: Right;    Prior to Admission medications   Medication Sig Start Date End Date Taking? Authorizing Provider  acetaminophen (TYLENOL) 500 MG tablet Take 1,000 mg by mouth every 6 (six) hours as needed for moderate pain.   Yes [provider]  allopurinol (ZYLOPRIM) 100 MG tablet Take 100 mg by mouth daily.  02/12/19  Yes [provider]  ALPRAZolam Duanne Moron) 1 MG tablet Take 0.5 mg by mouth at bedtime as needed for anxiety or sleep.    Yes [provider]  atorvastatin (LIPITOR) 40 MG tablet Take 40 mg by mouth daily.  08/26/19  Yes [provider]  fexofenadine (ALLEGRA) 180 MG tablet Take 180 mg by mouth daily.   Yes [provider]  gabapentin (NEURONTIN) 300 MG capsule Take 300 mg by mouth 2 (two) times daily.    Yes [provider]  glipiZIDE (GLUCOTROL) 5 MG tablet Take 2.5 mg by mouth daily  before breakfast.  08/12/18  Yes [provider]  Menthol-Methyl Salicylate (MUSCLE RUB) 10-15 % CREA Apply 1 application topically daily as needed for muscle pain.   Yes [provider]  pantoprazole (PROTONIX) 40 MG tablet Take 1 tablet (40 mg total) by mouth 2 (two) times daily before a meal. 10/19/19  Yes Mahala Menghini, PA-C  polyethylene glycol-electrolytes (TRILYTE) 420 g solution Take 4,000 mLs by mouth as directed. 10/30/19  Yes Analynn Daum, Cristopher Estimable, MD  Tamsulosin HCl (FLOMAX) 0.4 MG CAPS Take  0.4 mg by mouth 2 (two) times daily.  05/10/11  Yes [provider]  warfarin (COUMADIN) 5 MG tablet TAKE ONE TABLET BY MOUTH DAILY EXCEPT ONE-HALF TABLET ON TUESDAYS AND FRIDAYS OR AS DIRECTED Patient taking differently: Take 7.5 mg by mouth See admin instructions. Take 7.5 mg on Thurs and Fri, take 5 mg on Sat then hold for the procedure 10/12/19  Yes Herminio Commons, MD    Allergies as of 10/30/2019  . (No Known Allergies)    Family History  Problem Relation Age of Onset  . Prostate cancer Father        deceased  . Stroke Mother        deceased  . Colon cancer Neg Hx     Social History   Socioeconomic History  . Marital status: Married    Spouse name: Not on file  . Number of children: Not on file  . Years of education: Not on file  . Highest education level: Not on file  Occupational History  . Not on file  Tobacco Use  . Smoking status: Former Smoker    Packs/day: 1.50    Years: 30.00    Pack years: 45.00    Types: Cigarettes    Quit date: 11/18/1993    Years since quitting: 26.2  . Smokeless tobacco: Never Used  . Tobacco comment: quit in the 90's  Substance and Sexual Activity  . Alcohol use: No  . Drug use: No  . Sexual activity: Not on file  Other Topics Concern  . Not on file  Social History Narrative  . Not on file   Social Determinants of Health   Financial Resource Strain:   . Difficulty of Paying Living Expenses:   Food Insecurity:   . Worried About Charity fundraiser in the Last Year:   . Arboriculturist in the Last Year:   Transportation Needs:   . Film/video editor (Medical):   [name] Lack of Transportation (Non-Medical):   Physical Activity:   . Days of Exercise per Week:   . Minutes of Exercise per Session:   Stress:   . Feeling of Stress :   Social Connections:   . Frequency of Communication with Friends and Family:   . Frequency of Social Gatherings with Friends and Family:   . Attends Religious Services:   . Active  Member of Clubs or Organizations:   . Attends Archivist Meetings:   [name] Marital Status:   Intimate Partner Violence:   . Fear of Current or Ex-Partner:   . Emotionally Abused:   [name] Physically Abused:   . Sexually Abused:     Review of Systems: See HPI, otherwise negative ROS  Physical Exam: BP 109/72   Pulse 93   Temp 97.8 F (36.6 C) (Oral)   Resp 20   Ht 5\' 10"  (1.778 m)   Wt 90.7 kg   SpO2 97%   BMI 28.70 kg/m  General:   Alert,  Well-developed, well-nourished, pleasant and cooperative in NAD SNeck:  Supple; no masses or thyromegaly. No significant cervical adenopathy. Lungs:  Clear throughout to auscultation.   No wheezes, crackles, or rhonchi. No acute distress. Heart:  Regular rate and rhythm; no murmurs, clicks, rubs,  or gallops. Abdomen: Non-distended, normal bowel sounds.  Soft and nontender without appreciable mass or hepatosplenomegaly.  Pulses:  Normal pulses noted. Extremities:  Without clubbing or edema.  Impression/Plan: 79 year old gentleman history of Barrett's esophagus and colonic polyps.  Here for surveillance examination of both entities. The risks, benefits, limitations, imponderables and alternatives regarding both EGD and colonoscopy have been reviewed with the patient. Questions have been answered. All parties agreeable.      Notice: This dictation was prepared with Dragon dictation along with smaller phrase technology. Any transcriptional errors that result from this process are unintentional and may not be corrected upon review.

## 2020-01-28 NOTE — Anesthesia Postprocedure Evaluation (Signed)
Anesthesia Post Note  Patient: JONATHYN BACUS  Procedure(s) Performed: COLONOSCOPY WITH PROPOFOL (N/A ) ESOPHAGOGASTRODUODENOSCOPY (EGD) WITH PROPOFOL (N/A ) BIOPSY POLYPECTOMY  Patient location during evaluation: PACU Anesthesia Type: General Level of consciousness: awake Pain management: pain level controlled Vital Signs Assessment: post-procedure vital signs reviewed and stable Respiratory status: spontaneous breathing Cardiovascular status: stable Postop Assessment: no apparent nausea or vomiting Anesthetic complications: no     Last Vitals:  Vitals:   01/28/20 1115 01/28/20 1116  BP: 93/60   Pulse: (!) 33   Resp: 14   Temp: 36.8 C   SpO2: (!) 87% 98%    Last Pain:  Vitals:   01/28/20 1115  TempSrc:   PainSc: (P) Asleep                 Quanasia Defino Hristova

## 2020-01-28 NOTE — Transfer of Care (Signed)
Immediate Anesthesia Transfer of Care Note  Patient: [name]  Procedure(s) Performed: COLONOSCOPY WITH PROPOFOL (N/A ) ESOPHAGOGASTRODUODENOSCOPY (EGD) WITH PROPOFOL (N/A ) BIOPSY POLYPECTOMY  Patient Location: PACU  Anesthesia Type:General  Level of Consciousness: awake  Airway & Oxygen Therapy: Patient Spontanous Breathing  Post-op Assessment: Report given to RN and Post -op Vital signs reviewed and stable  Post vital signs: Reviewed and stable  Last Vitals:  Vitals Value Taken Time  BP 93/60 01/28/20 1115  Temp 36.8 C 01/28/20 1115  Pulse 82 01/28/20 1119  Resp 17 01/28/20 1119  SpO2 98 % 01/28/20 1119  Vitals shown include unvalidated device data.  Last Pain:  Vitals:   01/28/20 1115  TempSrc:   PainSc: (P) Asleep      Patients Stated Pain Goal: 5 ([id number])  Complications: No apparent anesthesia complications

## 2020-01-28 NOTE — Op Note (Signed)
[facility] Patient Name: [name] Procedure Date: 01/28/2020 10:44 AM MRN: [id number] Date of Birth: 07-Nov-1941 Attending MD: Norvel Richards , MD CSN: [id number] Age: 79 Admit Type: Outpatient Procedure:                Colonoscopy Indications:              High risk colon cancer surveillance: Personal                            history of colonic polyps Providers:                Norvel Richards, MD, Gwenlyn Fudge RN, RN,                            Randa Spike, Technician Referring MD:              Medicines:                Propofol per Anesthesia Complications:            No immediate complications. Estimated Blood Loss:     Estimated blood loss was minimal. Procedure:                Pre-Anesthesia Assessment:                           - Prior to the procedure, a History and Physical                            was performed, and patient medications and                            allergies were reviewed. The patient's tolerance of                            previous anesthesia was also reviewed. The risks                            and benefits of the procedure and the sedation                            options and risks were discussed with the patient.                            All questions were answered, and informed consent                            was obtained. Prior Anticoagulants: The patient                            last took Coumadin (warfarin) 4 days prior to the                            procedure and has taken no previous anticoagulant  or antiplatelet agents except for aspirin. ASA                            Grade Assessment: III - A patient with severe                            systemic disease. After reviewing the risks and                            benefits, the patient was deemed in satisfactory                            condition to undergo the procedure.                           After obtaining informed  consent, the colonoscope                            was passed under direct vision. Throughout the                            procedure, the patient's blood pressure, pulse, and                            oxygen saturations were monitored continuously. The                            [id number]) scope was introduced through                            the anus and advanced to the the cecum, identified                            by appendiceal orifice and ileocecal valve. The                            colonoscopy was performed without difficulty. The                            patient tolerated the procedure well. The quality                            of the bowel preparation was adequate. The                            ileocecal valve, appendiceal orifice, and rectum                            were photographed. Scope In: 10:46:42 AM Scope Out: 11:07:36 AM Scope Withdrawal Time: 0 hours 14 minutes 24 seconds  Total Procedure Duration: 0 hours 20 minutes 54 seconds  Findings:      The perianal and digital rectal examinations were normal.      Two sessile polyps were found in the cecum. The polyps were  2 to 5 mm in       size. These polyps were removed with a cold snare. Resection and       retrieval were complete. Estimated blood loss was minimal.      Scattered medium-mouthed diverticula were found in the entire colon.       Single anal papilla and minimal internal hemorrhoids.      The exam was otherwise without abnormality on direct and retroflexion       views. Impression:               - Two 2 to 5 mm polyps in the cecum, removed with a                            cold snare. Resected and retrieved.                           - Diverticulosis in the entire examined colon.                           - The examination was otherwise normal on direct                            and retroflexion views. Moderate Sedation:      Moderate (conscious) sedation was personally administered  by an       anesthesia professional. The following parameters were monitored: oxygen       saturation, heart rate, blood pressure, respiratory rate, EKG, adequacy       of pulmonary ventilation, and response to care. Recommendation:           - Patient has a contact number available for                            emergencies. The signs and symptoms of potential                            delayed complications were discussed with the                            patient. Return to normal activities tomorrow.                            Written discharge instructions were provided to the                            patient.                           - Resume previous diet.                           - Continue present medications.                           - Repeat colonoscopy date to be determined after  pending pathology results are reviewed for                            surveillance based on pathology results.                           - Return to GI office in 1 year. Resume Coumadin                            today. See EGD report Procedure Code(s):        --- Professional ---                           [phone], Colonoscopy, flexible; with removal of                            tumor(s), polyp(s), or other lesion(s) by snare                            technique Diagnosis Code(s):        --- Professional ---                           Z86.010, Personal history of colonic polyps                           K63.5, Polyp of colon                           K57.30, Diverticulosis of large intestine without                            perforation or abscess without bleeding CPT copyright 2019 [employer]. All rights reserved. The codes documented in this report are preliminary and upon coder review may  be revised to meet current compliance requirements. Cristopher Estimable. Dorotea Hand, MD Norvel Richards, MD 01/28/2020 11:53:01 AM This report has been signed  electronically. Number of Addenda: 0

## 2020-01-28 NOTE — Op Note (Signed)
[facility] Patient Name: [name] Procedure Date: 01/28/2020 10:00 AM MRN: [id number] Date of Birth: 1941-06-02 Attending MD: Norvel Richards , MD CSN: [id number] Age: 79 Admit Type: Outpatient Procedure:                Upper GI endoscopy Indications:              Surveillance procedure Providers:                Norvel Richards, MD, Jeanann Lewandowsky. Sharon Seller, RN,                            Randa Spike, Technician Referring MD:              Medicines:                Propofol per Anesthesia Complications:            No immediate complications. Estimated Blood Loss:     Estimated blood loss was minimal. Procedure:                Pre-Anesthesia Assessment:                           - Prior to the procedure, a History and Physical                            was performed, and patient medications and                            allergies were reviewed. The patient's tolerance of                            previous anesthesia was also reviewed. The risks                            and benefits of the procedure and the sedation                            options and risks were discussed with the patient.                            All questions were answered, and informed consent                            was obtained. Prior Anticoagulants: The patient                            last took Coumadin (warfarin) 4 days prior to the                            procedure and has taken no previous anticoagulant                            or antiplatelet agents except for aspirin. ASA  Grade Assessment: III - A patient with severe                            systemic disease. After reviewing the risks and                            benefits, the patient was deemed in satisfactory                            condition to undergo the procedure.                           After obtaining informed consent, the endoscope was                            passed under  direct vision. Throughout the                            procedure, the patient's blood pressure, pulse, and                            oxygen saturations were monitored continuously. The                            [id number] scope was introduced through the                            mouth, and advanced to the second part of duodenum.                            The upper GI endoscopy was accomplished without                            difficulty. The patient tolerated the procedure                            well. Scope In: 10:31:31 AM Scope Out: 10:40:38 AM Total Procedure Duration: 0 hours 9 minutes 7 seconds  Findings:      Barrett's esophagus was present at the gastroesophageal junction.;       Epithelium coming up from 40 cm (GE junction) to 35 cm. 2 small islands       as well. No nodularity no esophagitis. No biopsies taken.      A small hiatal hernia was present.      The exam was otherwise without abnormality.      The duodenal bulb and second portion of the duodenum were normal. Impression:               - Barrett's esophagus. Status post segmental biopsy                           - Small hiatal hernia.                           - The examination was otherwise normal.                           -  Normal duodenal bulb and second portion of the                            duodenum.                           - Moderate Sedation:      Moderate (conscious) sedation was personally administered by an       anesthesia professional. The following parameters were monitored: oxygen       saturation, heart rate, blood pressure, respiratory rate, EKG, adequacy       of pulmonary ventilation, and response to care. Recommendation:           - The signs and symptoms of potential delayed                            complications were discussed with the patient.                           - Patient has a contact number available for                            emergencies.                            - Return to normal activities tomorrow. Recommend                            decrease Protonix to 40 mg once daily. See                            colonoscopy report. Resume Coumadin today.                           - Advance diet as tolerated. Procedure Code(s):        --- Professional ---                           [phone], Esophagogastroduodenoscopy, flexible,                            transoral; diagnostic, including collection of                            specimen(s) by brushing or washing, when performed                            (separate procedure) Diagnosis Code(s):        --- Professional ---                           K22.70, Barrett's esophagus without dysplasia                           K44.9, Diaphragmatic hernia without obstruction or  gangrene CPT copyright 2019 [employer]. All rights reserved. The codes documented in this report are preliminary and upon coder review may  be revised to meet current compliance requirements. Cristopher Estimable. Coryn Mosso, MD Norvel Richards, MD 01/28/2020 11:10:48 AM This report has been signed electronically. Number of Addenda: 0

## 2020-01-28 NOTE — Anesthesia Preprocedure Evaluation (Signed)
Anesthesia Evaluation  Patient identified by MRN, date of birth, ID band Patient awake    Reviewed: Allergy & Precautions, H&P , NPO status , Patient's Chart, lab work & pertinent test results, reviewed documented beta blocker date and time   Airway        Dental   Pulmonary neg pulmonary ROS, former smoker,           Cardiovascular hypertension, + angina (history, not current) + CAD  + dysrhythmias Atrial Fibrillation      Neuro/Psych negative neurological ROS  negative psych ROS   GI/Hepatic Neg liver ROS, hiatal hernia, GERD  ,  Endo/Other  negative endocrine ROS  Renal/GU negative Renal ROS  negative genitourinary   Musculoskeletal   Abdominal   Peds  Hematology negative hematology ROS (+)   Anesthesia Other Findings   Reproductive/Obstetrics negative OB ROS                             Anesthesia Physical Anesthesia Plan  ASA: III  Anesthesia Plan: General   Post-op Pain Management:    Induction:   PONV Risk Score and Plan: 2 and Propofol infusion  Airway Management Planned:   Additional Equipment:   Intra-op Plan:   Post-operative Plan:   Informed Consent: I have reviewed the patients History and Physical, chart, labs and discussed the procedure including the risks, benefits and alternatives for the proposed anesthesia with the patient or authorized representative who has indicated his/her understanding and acceptance.       Plan Discussed with:   Anesthesia Plan Comments:         Anesthesia Quick Evaluation

## 2020-01-29 ENCOUNTER — Encounter: Payer: Self-pay | Admitting: Internal Medicine

## 2020-01-29 LAB — SURGICAL PATHOLOGY

## 2020-02-01 DIAGNOSIS — Z23 Encounter for immunization: Secondary | ICD-10-CM | POA: Diagnosis not present

## 2020-02-04 ENCOUNTER — Ambulatory Visit (INDEPENDENT_AMBULATORY_CARE_PROVIDER_SITE_OTHER): Payer: Medicare Other | Admitting: *Deleted

## 2020-02-04 ENCOUNTER — Other Ambulatory Visit: Payer: Self-pay

## 2020-02-04 DIAGNOSIS — Z5181 Encounter for therapeutic drug level monitoring: Secondary | ICD-10-CM

## 2020-02-04 DIAGNOSIS — I4891 Unspecified atrial fibrillation: Secondary | ICD-10-CM | POA: Diagnosis not present

## 2020-02-04 LAB — POCT INR: INR: 3 (ref 2.0–3.0)

## 2020-02-04 NOTE — Patient Instructions (Signed)
Continue warfarin 1 tablet daily. Recheck INR in 3 wks Call us with any new medications, changes or any bleeding concerns @ [phone]

## 2020-02-08 ENCOUNTER — Telehealth: Payer: Self-pay | Admitting: Urology

## 2020-02-08 DIAGNOSIS — G2581 Restless legs syndrome: Secondary | ICD-10-CM | POA: Insufficient documentation

## 2020-02-08 DIAGNOSIS — G8929 Other chronic pain: Secondary | ICD-10-CM | POA: Insufficient documentation

## 2020-02-08 DIAGNOSIS — M5416 Radiculopathy, lumbar region: Secondary | ICD-10-CM | POA: Insufficient documentation

## 2020-02-08 DIAGNOSIS — E1142 Type 2 diabetes mellitus with diabetic polyneuropathy: Secondary | ICD-10-CM | POA: Insufficient documentation

## 2020-02-08 DIAGNOSIS — M130 Polyarthritis, unspecified: Secondary | ICD-10-CM | POA: Insufficient documentation

## 2020-02-08 DIAGNOSIS — M47818 Spondylosis without myelopathy or radiculopathy, sacral and sacrococcygeal region: Secondary | ICD-10-CM | POA: Insufficient documentation

## 2020-02-08 NOTE — Telephone Encounter (Signed)
Pt reports increase difficulty voiding. Appt scheduled tomorrow at 1:15 with Dr. Diona Fanti

## 2020-02-08 NOTE — Telephone Encounter (Signed)
Pt has a new issue and wanted to be seen this week by Dr Jeffie Pollock. He asked for a nurse to return his call.

## 2020-02-09 ENCOUNTER — Other Ambulatory Visit: Payer: Self-pay

## 2020-02-09 ENCOUNTER — Encounter: Payer: Self-pay | Admitting: Urology

## 2020-02-09 ENCOUNTER — Ambulatory Visit (INDEPENDENT_AMBULATORY_CARE_PROVIDER_SITE_OTHER): Payer: Medicare Other | Admitting: Urology

## 2020-02-09 VITALS — BP 125/74 | HR 87 | Temp 97.9°F | Ht 71.0 in | Wt 212.0 lb

## 2020-02-09 DIAGNOSIS — R39198 Other difficulties with micturition: Secondary | ICD-10-CM | POA: Diagnosis not present

## 2020-02-09 DIAGNOSIS — N481 Balanitis: Secondary | ICD-10-CM

## 2020-02-09 LAB — POCT URINALYSIS DIPSTICK
Blood, UA: NEGATIVE
Glucose, UA: POSITIVE — AB
Ketones, UA: NEGATIVE
Leukocytes, UA: NEGATIVE
Nitrite, UA: NEGATIVE
Protein, UA: NEGATIVE
Spec Grav, UA: >=1.03 — AB (ref 1.010–1.025)
Urobilinogen, UA: NEGATIVE E.U./dL — AB
pH, UA: 5 (ref 5.0–8.0)

## 2020-02-09 LAB — BLADDER SCAN AMB NON-IMAGING: Scan Result: 35.3

## 2020-02-09 MED ORDER — CEPHALEXIN 250 MG PO CAPS: 250.0000 mg | ORAL_CAPSULE | Freq: Two times a day (BID) | ORAL | 0 refills | Status: DC

## 2020-02-09 MED ORDER — CLOTRIMAZOLE-BETAMETHASONE 1-0.05 % EX CREA: TOPICAL_CREAM | CUTANEOUS | 1 refills | Status: DC

## 2020-02-09 NOTE — Progress Notes (Signed)
Urological Symptom Review  Patient is experiencing the following symptoms: Frequent urination Burning/pain with urination Get up at night to urinate Leakage of urine Stream starts and stops Trouble starting stream Weak stream Erection problems (male only)   Review of Systems  Gastrointestinal (upper)  : Negative for upper GI symptoms  Gastrointestinal (lower) : Negative for lower GI symptoms  Constitutional : Fatigue  Skin: Negative for skin symptoms  Eyes: Negative for eye symptoms  Ear/Nose/Throat : Negative for Ear/Nose/Throat symptoms  Hematologic/Lymphatic: Negative for Hematologic/Lymphatic symptoms  Cardiovascular : Negative for cardiovascular symptoms  Respiratory : Shortness of breath   Endocrine: Negative for endocrine symptoms  Musculoskeletal: Back pain  Neurological: Negative for neurological symptoms  Psychologic: Anxiety

## 2020-02-09 NOTE — Progress Notes (Signed)
H&P  Chief Complaint: Dysuria   History of Present Illness:   3.23.2021: Most recent PSA was 6.3 on 1.5.2021. He presents today c/o of sore spot underneath his penis that he has had for the last 2 wks. This has been too painful to touch. He has tried managing pain with medications and hot baths without success. He has also tried to "pop" it without producing any drainage. This pain has started to lessen in severity but he has recently begun having some mild dysuria and increased nocturia. Otherwise, his urinary sx's are stable.   (below copied from [facility] records):  Elevated PSA:  [name] is a 79 year-old male established patient who is here for an elevated PSA.  His PSA is 6. He has not had a prostate nodule on a physical examination. He has not had recurrent prostate infections or chronic prostatitis.   He has not been on antibiotics for prostate infections previously. He has had a prostate biopsy done. His first prostate biopsy was done approximately 09/19/2010. He does have the pathology report from his biopsy.   He has a history of an elevated PSA with a negative biopsy in 2011. His PSA was 4.87 prior to the biopsy but declined on finasteride to 3.17 but he is off of that and the level has been rising slowly   04/24/19: [name] returns today in f/u for his history of an elevated PSA with a negative biopsy in 2011. His PSA was back down to 6.4 from 9.7 prior to this visit.   He is currently on prednisone for gout.    01/27/19 10/23/18 10/17/17 09/18/16 09/06/15 03/02/15 04/03/14 06/13/12  PSA  Total PSA 6.4 ng/dl 9.7 ng/dl 5.3 ng/dl 5.8  4.66  5.64  5.20  4.36   Free PSA    1.0  0.56  0.58  0.64  0.62   % Free PSA  8 %  17  12  10  12  14     BPH:  He is currently taking tamsulosin. He is not on new medications for symptoms of prostate enlargement.   He does have an abnormal sensation when needing to urinate. He is not having problems getting his urine stream started. He does  not have a good size and strength to his urinary stream. He is not having problems with emptying his bladder well. He does not dribble at the end of urination.   04/24/19: He has stable moderate to severe LUTS with an IPSS of 20 on tamsulosin. he has nocturia x 3 and a reduced stream. He had been on finasteride remotely. He has no associated signs or symptoms.  Past Medical History:  Diagnosis Date  . Barrett's esophagus    short-segment, diagnosed in 2007 by Dr. [name]  . BPH (benign prostatic hyperplasia)   . CAD (coronary artery disease)    a. cath on 07/21/2019 showing patent LM and LCx with 40-60% Proximal LAD and 30-40% Pro-RCA stenosis with normal LV function.  [name] GERD (gastroesophageal reflux disease)   . Hiatal hernia    small  . HTN (hypertension)   . PAF (paroxysmal atrial fibrillation) ([facility])   . S/P colonoscopy 2007   unremarkable  . S/P endoscopy Oct 2011    Salmon-colored epithelium coming up to 37 cm from the   . Vertigo     Past Surgical History:  Procedure Laterality Date  . APPENDECTOMY    . BIOPSY  01/28/2020   Procedure: BIOPSY;  Surgeon: [name], MD;  Location: [facility];  Service: Endoscopy;;  esophagus  . CHOLECYSTECTOMY    . COLONOSCOPY  02/06/2006   Dr. [name]- single anal papilla, o/w normal rectum, normal colon  . COLONOSCOPY N/A 09/13/2016   RMR: three 5-6 mm for adenomas removed.  Recommend one last surveillance colonoscopy in October 2020.  [name] COLONOSCOPY WITH PROPOFOL N/A 01/28/2020   Procedure: COLONOSCOPY WITH PROPOFOL;  Surgeon: [name], MD;  Location: [facility];  Service: Endoscopy;  Laterality: N/A;  10:45am  . ESOPHAGOGASTRODUODENOSCOPY  09/15/2010   Dr. [name]- Salmon-colored epithelium coming up to 37 cm from the   . ESOPHAGOGASTRODUODENOSCOPY N/A 07/02/2013   Dr. [name]- Barretts on bx. hiatal hernia  . ESOPHAGOGASTRODUODENOSCOPY N/A 09/13/2016   RMR: Barrett's esophagus without dysplasia. one last EGD in 3 years.   .  ESOPHAGOGASTRODUODENOSCOPY (EGD) WITH PROPOFOL N/A 01/28/2020   Procedure: ESOPHAGOGASTRODUODENOSCOPY (EGD) WITH PROPOFOL;  Surgeon: [name], MD;  Location: [facility];  Service: Endoscopy;  Laterality: N/A;  . INTRAVASCULAR PRESSURE WIRE/FFR STUDY N/A 07/21/2019   Procedure: INTRAVASCULAR PRESSURE WIRE/FFR STUDY;  Surgeon: [name], MD;  Location: [facility];  Service: Cardiovascular;  Laterality: N/A;  . LEFT HEART CATH AND CORONARY ANGIOGRAPHY N/A 07/21/2019   Procedure: LEFT HEART CATH AND CORONARY ANGIOGRAPHY;  Surgeon: [name], MD;  Location: [facility];  Service: Cardiovascular;  Laterality: N/A;  . POLYPECTOMY  01/28/2020   Procedure: POLYPECTOMY;  Surgeon: [name], MD;  [facility];  Service: Endoscopy;;  colon  . ROTATOR CUFF REPAIR    . YAG LASER APPLICATION Right [id number]   Procedure: YAG LASER APPLICATION;  Surgeon: [name], MD;  Location: [facility];  Service: Ophthalmology;  Laterality: Right;    Home Medications:  Allergies as of 02/09/2020   No Known Allergies     Medication List       Accurate as of February 09, 2020  1:51 PM. If you have any questions, ask your nurse or doctor.        acetaminophen 500 MG tablet Commonly known as: TYLENOL Take 1,000 mg by mouth every 6 (six) hours as needed for moderate pain.   allopurinol 100 MG tablet Commonly known as: ZYLOPRIM Take 100 mg by mouth daily.   ALPRAZolam 1 MG tablet Commonly known as: XANAX Take 0.5 mg by mouth at bedtime as needed for anxiety or sleep.   atorvastatin 40 MG tablet Commonly known as: LIPITOR Take 40 mg by mouth daily.   fexofenadine 180 MG tablet Commonly known as: ALLEGRA Take 180 mg by mouth daily.   gabapentin 300 MG capsule Commonly known as: NEURONTIN Take 300 mg by mouth 2 (two) times daily.   glipiZIDE 5 MG tablet Commonly known as: GLUCOTROL Take 2.5 mg by mouth daily before breakfast.   Muscle Rub 10-15 % Crea Apply  1 application topically daily as needed for muscle pain.   pantoprazole 40 MG tablet Commonly known as: PROTONIX Take 1 tablet (40 mg total) by mouth 2 (two) times daily before a meal.   polyethylene glycol-electrolytes 420 g solution Commonly known as: TriLyte Take 4,000 mLs by mouth as directed.   tamsulosin 0.4 MG Caps capsule Commonly known as: FLOMAX Take 0.4 mg by mouth 2 (two) times daily.   warfarin 5 MG tablet Commonly known as: COUMADIN Take as directed by the anticoagulation clinic. If you are unsure how to take this medication, talk to your nurse or doctor. Original instructions:  TAKE ONE TABLET BY MOUTH DAILY EXCEPT ONE-HALF TABLET ON TUESDAYS AND FRIDAYS OR AS DIRECTED What changed:   how much to take  how to take this  when to take this  additional instructions       Allergies: No Known Allergies  Family History  Problem Relation Age of Onset  . Prostate cancer Father        deceased  . Stroke Mother        deceased  . Colon cancer Neg Hx     Social History:  reports that he quit smoking about 26 years ago. His smoking use included cigarettes. He has a 45.00 pack-year smoking history. He has never used smokeless tobacco. He reports that he does not drink alcohol or use drugs.  ROS: A complete review of systems was performed.  All systems are negative except for pertinent findings as noted.  Physical Exam:  Vital signs in last 24 hours: BP 125/74   Pulse 87   Temp 97.9 F (36.6 C)   Ht 5\' 11"  (1.803 m)   Wt 212 lb (96.2 kg)   BMI 29.57 kg/m  Constitutional:  Alert and oriented, No acute distress, Obese Cardiovascular: Regular rate  Respiratory: Normal respiratory effort GI: Abdomen is soft, nontender, nondistended, no abdominal masses. No CVAT.  Genitourinary: Normal male phallus, testes are descended bilaterally and non-tender and without masses, scrotum is normal in appearance without lesions or masses, perineum is normal on inspection.  Frenulum erythematous and slightly indurated.  Lymphatic: No lymphadenopathy Neurologic: Grossly intact, no focal deficits Psychiatric: Normal mood and affect  Laboratory Data:  No results for input(s): WBC, HGB, HCT, PLT in the last 72 hours.  No results for input(s): NA, K, CL, GLUCOSE, BUN, CALCIUM, CREATININE in the last 72 hours.  Invalid input(s): CO3   Results for orders placed or performed in visit on 02/09/20 (from the past 24 hour(s))  POCT urinalysis dipstick     Status: Abnormal   Collection Time: 02/09/20  1:38 PM  Result Value Ref Range   Color, UA dk yellow    Clarity, UA clear    Glucose, UA Positive (A) Negative   Bilirubin, UA small    Ketones, UA neg    Spec Grav, UA >=1.030 (A) 1.010 - 1.025   Blood, UA neg    pH, UA 5.0 5.0 - 8.0   Protein, UA Negative Negative   Urobilinogen, UA negative (A) 0.2 or 1.0 E.U./dL   Nitrite, UA neg    Leukocytes, UA Negative Negative   Appearance clear.    Odor     I have reviewed prior pt notes  I have reviewed urinalysis results  I have reviewed prior PSA results     Impression/Assessment:  Balanitis-- may be secondary to bacterial or fungal infection -- this seems to be clearing up on its own.   His urinary sx's are largely stable with his only complaints now being during the night. This is certainly exacerbated by the fact that he has to wake up to help his wife several times during the night. PVR today only 35 mL.   Plan:  1. Advised on bladder retraining technique -- he is emptying well and should ignore the second urge to urinate he gets after voiding the first time.   2. We again discussed some tips for managing his nocturia.   3. Start on keflex and antifungal cream.   4. Keep scheduled appointment.

## 2020-02-10 DIAGNOSIS — M1388 Other specified arthritis, other site: Secondary | ICD-10-CM | POA: Diagnosis not present

## 2020-02-10 DIAGNOSIS — E1142 Type 2 diabetes mellitus with diabetic polyneuropathy: Secondary | ICD-10-CM | POA: Diagnosis not present

## 2020-02-10 DIAGNOSIS — Z79891 Long term (current) use of opiate analgesic: Secondary | ICD-10-CM | POA: Insufficient documentation

## 2020-02-10 DIAGNOSIS — M130 Polyarthritis, unspecified: Secondary | ICD-10-CM | POA: Diagnosis not present

## 2020-02-10 DIAGNOSIS — M545 Low back pain: Secondary | ICD-10-CM | POA: Diagnosis not present

## 2020-02-17 DIAGNOSIS — K2270 Barrett's esophagus without dysplasia: Secondary | ICD-10-CM | POA: Diagnosis not present

## 2020-02-17 DIAGNOSIS — E7849 Other hyperlipidemia: Secondary | ICD-10-CM | POA: Diagnosis not present

## 2020-02-17 DIAGNOSIS — I480 Paroxysmal atrial fibrillation: Secondary | ICD-10-CM | POA: Diagnosis not present

## 2020-02-17 DIAGNOSIS — E119 Type 2 diabetes mellitus without complications: Secondary | ICD-10-CM | POA: Diagnosis not present

## 2020-02-19 ENCOUNTER — Telehealth: Payer: Self-pay | Admitting: Cardiovascular Disease

## 2020-02-19 NOTE — Telephone Encounter (Signed)
INR on 02/04/20 was 3.0 continued on 1 tablet daily and recheck in 3 weeks on 02/29/20.  Attempted to call pt back, LMOM TCB to discuss increased bruising.  Could move pt appt up to Monday 02/22/20 if pt would like INR checked sooner.  Will await pt call back.

## 2020-02-19 NOTE — Telephone Encounter (Signed)
Patient called stating that he has started bruising for approximately a week now. Is concerned because of his blood thinner.

## 2020-02-19 NOTE — Telephone Encounter (Signed)
Pt reports 1 bruise on R left hand, nickel size bruise, 1 bruise on R forarm was half dollar size but but now resolving to smaller spots, and 1 bruise nickel size on R hand at knuckles.  Pt denies any large bruises and states the bruises he currently has do not seem to be growing in size.  Pt denies any blood in urine or stool at this time, and is aware to monitor for any other signs of bleeding or blood coming from anywhere it should not.  Pt denies any medication changes since last OV in anticoagulation clinic on 02/04/20.  Offered pt sooner appt on 02/22/20 to check his INR just to make sure it is in range and we do not need to adjust his Coumadin dosage.  Advised pt we do not recommend he skip or adjust his dosage without checking his INR first to see if adjustments are appropriate.  Pt verbalized understanding, made appt for 02/22/20 at 2:45pm, and advised pt to continue to monitor for bleeding and go to the ED if bleeding problems arise.

## 2020-02-24 ENCOUNTER — Other Ambulatory Visit: Payer: Self-pay

## 2020-02-24 ENCOUNTER — Ambulatory Visit (INDEPENDENT_AMBULATORY_CARE_PROVIDER_SITE_OTHER): Payer: Medicare Other | Admitting: *Deleted

## 2020-02-24 DIAGNOSIS — I4891 Unspecified atrial fibrillation: Secondary | ICD-10-CM | POA: Diagnosis not present

## 2020-02-24 DIAGNOSIS — Z5181 Encounter for therapeutic drug level monitoring: Secondary | ICD-10-CM

## 2020-02-24 LAB — POCT INR: INR: 3.4 — AB (ref 2.0–3.0)

## 2020-02-24 NOTE — Patient Instructions (Signed)
Hold warfarin tonight then decrease dose to 1 tablet daily except 1/2 tablet on Sundays and Wednesdays Recheck INR in 3 wks Call us with any new medications, changes or any bleeding concerns @ [phone].

## 2020-03-01 DIAGNOSIS — Z23 Encounter for immunization: Secondary | ICD-10-CM | POA: Diagnosis not present

## 2020-03-18 DIAGNOSIS — E7849 Other hyperlipidemia: Secondary | ICD-10-CM | POA: Diagnosis not present

## 2020-03-18 DIAGNOSIS — I480 Paroxysmal atrial fibrillation: Secondary | ICD-10-CM | POA: Diagnosis not present

## 2020-03-18 DIAGNOSIS — K2270 Barrett's esophagus without dysplasia: Secondary | ICD-10-CM | POA: Diagnosis not present

## 2020-03-18 DIAGNOSIS — E119 Type 2 diabetes mellitus without complications: Secondary | ICD-10-CM | POA: Diagnosis not present

## 2020-03-28 ENCOUNTER — Ambulatory Visit (INDEPENDENT_AMBULATORY_CARE_PROVIDER_SITE_OTHER): Payer: Medicare Other | Admitting: *Deleted

## 2020-03-28 ENCOUNTER — Other Ambulatory Visit: Payer: Self-pay

## 2020-03-28 DIAGNOSIS — I4891 Unspecified atrial fibrillation: Secondary | ICD-10-CM | POA: Diagnosis not present

## 2020-03-28 DIAGNOSIS — Z5181 Encounter for therapeutic drug level monitoring: Secondary | ICD-10-CM

## 2020-03-28 LAB — POCT INR: INR: 2.8 (ref 2.0–3.0)

## 2020-03-28 NOTE — Patient Instructions (Signed)
Continue warfarin 1 tablet daily except 1/2 tablet on Sundays and Wednesdays Recheck INR in 4 wks Call us with any new medications, changes or any bleeding concerns @ [phone].

## 2020-03-29 DIAGNOSIS — E119 Type 2 diabetes mellitus without complications: Secondary | ICD-10-CM | POA: Diagnosis not present

## 2020-03-29 DIAGNOSIS — Z6825 Body mass index (BMI) 25.0-25.9, adult: Secondary | ICD-10-CM | POA: Diagnosis not present

## 2020-03-29 DIAGNOSIS — N400 Enlarged prostate without lower urinary tract symptoms: Secondary | ICD-10-CM | POA: Diagnosis not present

## 2020-03-29 DIAGNOSIS — I482 Chronic atrial fibrillation, unspecified: Secondary | ICD-10-CM | POA: Diagnosis not present

## 2020-03-29 DIAGNOSIS — E538 Deficiency of other specified B group vitamins: Secondary | ICD-10-CM | POA: Diagnosis not present

## 2020-03-29 DIAGNOSIS — K219 Gastro-esophageal reflux disease without esophagitis: Secondary | ICD-10-CM | POA: Diagnosis not present

## 2020-03-29 DIAGNOSIS — R5383 Other fatigue: Secondary | ICD-10-CM | POA: Diagnosis not present

## 2020-03-29 DIAGNOSIS — E559 Vitamin D deficiency, unspecified: Secondary | ICD-10-CM | POA: Diagnosis not present

## 2020-03-29 DIAGNOSIS — R0600 Dyspnea, unspecified: Secondary | ICD-10-CM | POA: Diagnosis not present

## 2020-04-06 DIAGNOSIS — M5416 Radiculopathy, lumbar region: Secondary | ICD-10-CM | POA: Diagnosis not present

## 2020-04-06 DIAGNOSIS — G894 Chronic pain syndrome: Secondary | ICD-10-CM | POA: Diagnosis not present

## 2020-04-06 DIAGNOSIS — E1142 Type 2 diabetes mellitus with diabetic polyneuropathy: Secondary | ICD-10-CM | POA: Diagnosis not present

## 2020-04-06 DIAGNOSIS — M545 Low back pain: Secondary | ICD-10-CM | POA: Diagnosis not present

## 2020-04-06 DIAGNOSIS — Z79891 Long term (current) use of opiate analgesic: Secondary | ICD-10-CM | POA: Diagnosis not present

## 2020-04-06 DIAGNOSIS — M130 Polyarthritis, unspecified: Secondary | ICD-10-CM | POA: Diagnosis not present

## 2020-04-07 ENCOUNTER — Other Ambulatory Visit: Payer: Self-pay

## 2020-04-07 ENCOUNTER — Ambulatory Visit (INDEPENDENT_AMBULATORY_CARE_PROVIDER_SITE_OTHER): Payer: Medicare Other | Admitting: Family Medicine

## 2020-04-07 ENCOUNTER — Encounter: Payer: Self-pay | Admitting: Family Medicine

## 2020-04-07 VITALS — BP 116/80 | HR 86 | Ht 71.0 in | Wt 217.2 lb

## 2020-04-07 DIAGNOSIS — I480 Paroxysmal atrial fibrillation: Secondary | ICD-10-CM | POA: Diagnosis not present

## 2020-04-07 DIAGNOSIS — R0602 Shortness of breath: Secondary | ICD-10-CM | POA: Diagnosis not present

## 2020-04-07 DIAGNOSIS — I4892 Unspecified atrial flutter: Secondary | ICD-10-CM | POA: Diagnosis not present

## 2020-04-07 DIAGNOSIS — R55 Syncope and collapse: Secondary | ICD-10-CM | POA: Diagnosis not present

## 2020-04-07 DIAGNOSIS — I2510 Atherosclerotic heart disease of native coronary artery without angina pectoris: Secondary | ICD-10-CM

## 2020-04-07 NOTE — Progress Notes (Addendum)
Cardiology Office Note  Date: 04/07/2020   ID: [name], DOB 17-Jun-1941, MRN [id number]  PCP:  Redmond School, MD  Cardiologist:  Kate Sable, MD Electrophysiologist:  None   Chief Complaint: Follow-up paroxysmal atrial fib, CAD, hypertension  History of Present Illness: [name] is a 79 y.o. male with a history of  paroxysmal atrial fib.,  CAD, hypertension.  Cardiac catheterization 07/21/2019 showed patent LM and left circumflex with 40 to 60% proximal LAD and 30 to 40% proximal RCA stenosis with normal LV function.  Aggressive risk factor modification was recommended along with possible sleep study given his snoring.  Last encounter via telemedicine with Dr. Bronson Ing.  Patient states his heart races when hegoes up a flight of stairs and has to sit down and rest to recover his breathing.  He stated he had episodes of feeling like he is going to pass out when he sitting down.  Had not checked his blood pressure or blood sugar during his episodes but had episodes of hypoglycemia on other occasions.  Has occasional palpitations and has developed bradycardia in the past with low-dose Toprol and is currently not on beta-blockers.  He was asked to monitor his blood sugar and blood pressure during the episodes when he feels dizzy while sitting down.  Recently presented to PCP office for complaints of fatigue.  CBC was unremarkable.  Glucose was 142, triglycerides 189, HDL 34, LDL 118, TSH was 3.1, Vitamin D 22.1, PSA was high at 8.7.  Patient states he is having increasing shortness of breath..  States he is having less and less energy.  He is taking care of his chronically ill wife.  At his last cardiac catheterization it was suggested that he have a sleep study.  Patient does not want to look into a sleep study.  He is certain he cannot wear the device if he was positive for sleep apnea.  He states when he has to exert, the least little bit of exertional activity such as  helping his wife or bending over to pick up something he gets a little dizzy but no near syncopal or syncopal episodes.  His EKG today shows atrial flutter with variable AV block rate of 75.  His last echo was June 2019.  He denies any anginal symptoms.  Denies any PND or orthopnea.  No evidence of lower extremity edema.   Past Medical History:  Diagnosis Date  . Barrett's esophagus    short-segment, diagnosed in 2007 by Dr. [name]  . BPH (benign prostatic hyperplasia)   . CAD (coronary artery disease)    a. cath on 07/21/2019 showing patent LM and LCx with 40-60% Proximal LAD and 30-40% Pro-RCA stenosis with normal LV function.  [name] GERD (gastroesophageal reflux disease)   . Hiatal hernia    small  . HTN (hypertension)   . PAF (paroxysmal atrial fibrillation) ([place])   . S/P colonoscopy 2007   unremarkable  . S/P endoscopy Oct 2011    Salmon-colored epithelium coming up to 37 cm from the   . Vertigo     Past Surgical History:  Procedure Laterality Date  . APPENDECTOMY    . BIOPSY  01/28/2020   Procedure: BIOPSY;  Surgeon: [name], MD;  Location: [facility];  Service: Endoscopy;;  esophagus  . CHOLECYSTECTOMY    . COLONOSCOPY  02/06/2006   Dr. [name]- single anal papilla, o/w normal rectum, normal colon  . COLONOSCOPY N/A 09/13/2016   RMR: three 5-6 mm  for adenomas removed.  Recommend one last surveillance colonoscopy in October 2020.  [name] COLONOSCOPY WITH PROPOFOL N/A 01/28/2020   Procedure: COLONOSCOPY WITH PROPOFOL;  Surgeon: [name], MD;  Location: [facility];  Service: Endoscopy;  Laterality: N/A;  10:45am  . ESOPHAGOGASTRODUODENOSCOPY  09/15/2010   Dr. [name]- Salmon-colored epithelium coming up to 37 cm from the   . ESOPHAGOGASTRODUODENOSCOPY N/A 07/02/2013   Dr. [name]- Barretts on bx. hiatal hernia  . ESOPHAGOGASTRODUODENOSCOPY N/A 09/13/2016   RMR: Barrett's esophagus without dysplasia. one last EGD in 3 years.   . ESOPHAGOGASTRODUODENOSCOPY (EGD) WITH PROPOFOL  N/A 01/28/2020   Procedure: ESOPHAGOGASTRODUODENOSCOPY (EGD) WITH PROPOFOL;  Surgeon: [name], MD;  [facility];  Service: Endoscopy;  Laterality: N/A;  . INTRAVASCULAR PRESSURE WIRE/FFR STUDY N/A 07/21/2019   Procedure: INTRAVASCULAR PRESSURE WIRE/FFR STUDY;  Surgeon: [name], MD;  Location: [facility];  Service: Cardiovascular;  Laterality: N/A;  . LEFT HEART CATH AND CORONARY ANGIOGRAPHY N/A 07/21/2019   Procedure: LEFT HEART CATH AND CORONARY ANGIOGRAPHY;  Surgeon: [name], MD;  Location: [facility];  Service: Cardiovascular;  Laterality: N/A;  . POLYPECTOMY  01/28/2020   Procedure: POLYPECTOMY;  Surgeon: [name], MD;  [facility];  Service: Endoscopy;;  colon  . ROTATOR CUFF REPAIR    . YAG LASER APPLICATION Right [id number]   Procedure: YAG LASER APPLICATION;  Surgeon: [name], MD;  Location: [facility];  Service: Ophthalmology;  Laterality: Right;    Current Outpatient Medications  Medication Sig Dispense Refill  . acetaminophen (TYLENOL) 500 MG tablet Take 1,000 mg by mouth every 6 (six) hours as needed for moderate pain.    [name] allopurinol (ZYLOPRIM) 100 MG tablet Take 100 mg by mouth daily.     [name] ALPRAZolam (XANAX) 1 MG tablet Take 0.5 mg by mouth at bedtime as needed for anxiety or sleep.     [name] atorvastatin (LIPITOR) 40 MG tablet Take 40 mg by mouth daily.     . fexofenadine (ALLEGRA) 180 MG tablet Take 180 mg by mouth daily.    [name] gabapentin (NEURONTIN) 300 MG capsule Take 300 mg by mouth 2 (two) times daily.     [name] glipiZIDE (GLUCOTROL) 5 MG tablet Take 2.5 mg by mouth daily before breakfast.   4  . pantoprazole (PROTONIX) 40 MG tablet Take 1 tablet (40 mg total) by mouth 2 (two) times daily before a meal. 180 tablet 3  . Tamsulosin HCl (FLOMAX) 0.4 MG CAPS Take 0.4 mg by mouth 2 (two) times daily.     . traMADol (ULTRAM) 50 MG tablet Take 50 mg by mouth as needed.    . warfarin (COUMADIN) 5 MG tablet TAKE ONE TABLET  BY MOUTH DAILY EXCEPT ONE-HALF TABLET ON TUESDAYS AND FRIDAYS OR AS DIRECTED (Patient taking differently: Take 7.5 mg by mouth See admin instructions. Take 7.5 mg on Thurs and Fri, take 5 mg on Sat then hold for the procedure) 45 tablet 4   No current facility-administered medications for this visit.   Allergies:  Patient has no known allergies.   Social History: The patient  reports that he quit smoking about 26 years ago. His smoking use included cigarettes. He has a 45.00 pack-year smoking history. He has never used smokeless tobacco. He reports that he does not drink alcohol or use drugs.   Family History: The patient's family history includes Prostate cancer in his father; Stroke in his mother.  ROS:  Please see the history of present illness. Otherwise, complete review of systems is positive for none.  All other systems are reviewed and negative.   Physical Exam: VS:  BP 116/80   Pulse 86   Ht 5\' 11"  (1.803 m)   Wt 217 lb 3.2 oz (98.5 kg)   SpO2 99%   BMI 30.29 kg/m , BMI Body mass index is 30.29 kg/m.  Wt Readings from Last 3 Encounters:  04/07/20 217 lb 3.2 oz (98.5 kg)  02/09/20 212 lb (96.2 kg)  01/28/20 200 lb (90.7 kg)    General: Patient appears comfortable at rest. Neck: Supple, no elevated JVP or carotid bruits, no thyromegaly. Lungs: Clear to auscultation, nonlabored breathing at rest. Cardiac: Irregularly irregular rate and rhythm, no S3 or significant systolic murmur, no pericardial rub. Extremities: No pitting edema, distal pulses 2+. Skin: Warm and dry. Musculoskeletal: No kyphosis. Neuropsychiatric: Alert and oriented x3, affect grossly appropriate.  ECG:  An ECG dated 04/07/2020 was personally reviewed today and demonstrated:  Atrial flutter with variable AV block, RSR prime or QR pattern in V1 suggests right ventricular conduction delay rate of 75  Recent Labwork: 01/26/2020: BUN 19; Creatinine, Ser 1.06; Hemoglobin 14.2; Platelets 192; Potassium 3.7;  Sodium 135  No results found for: CHOL, TRIG, HDL, CHOLHDL, VLDL, LDLCALC, LDLDIRECT  Other Studies Reviewed Today:   Assessment and Plan:  1. Atrial flutter, unspecified type ([place])   2. PAF (paroxysmal atrial fibrillation) ([place])   3. CAD in native artery   4. Pre-syncope   5. SOB (shortness of breath)     1. Atrial flutter, unspecified type ([place]) EKG today shows atrial flutter with rate of 75 with variable AV block, RSR prime or QR pattern in V1 suggests right ventricular conduction delay.  Continue Coumadin 5 mg by mouth daily except 1/2 tablet on Tuesdays and Fridays.  Rate is controlled without AV nodal blockers.   3. CAD in native artery Cardiac catheterization 07/30/2019 showed widely patent left main, 40 to 60% proximal LAD with mild calcification.  DFR on LAD stenosis 0.94, widely patent circumflex, very tortuous proximal right coronary artery with 30 to 40% proximal narrowing.  No significant obstruction identified, normal LV function.  Patient denies any typical anginal symptoms.   4. Pre-syncope Patient denies any feelings of faintness but does complain of dizziness when bending over and attempting to stand erect.  He has never fallen or passed out per his statement.  He does have a significant history of vertigo per his statement.  States he has not had any significant vertigo recently.  5. SOB (shortness of breath) Continues to complain of increasing shortness of breath.  Worse with exertion last echocardiogram 2019.  Please get a repeat echocardiogram to check LV function and functional status or valvular disease.  Medication Adjustments/Labs and Tests Ordered: Current medicines are reviewed at length with the patient today.  Concerns regarding medicines are outlined above.   Disposition: Follow-up with Dr. Bronson Ing or APP 1 month  Signed, Levell July, NP 04/07/2020 3:12 PM    [facility] [address], [place], [place]  [address] Phone: [phone]; Fax: [phone]

## 2020-04-07 NOTE — Patient Instructions (Signed)
Medication Instructions:  Continue all current medications.  Labwork: none  Testing/Procedures:  Your physician has requested that you have an echocardiogram. Echocardiography is a painless test that uses sound waves to create images of your heart. It provides your doctor with information about the size and shape of your heart and how well your heart's chambers and valves are working. This procedure takes approximately one hour. There are no restrictions for this procedure.  Office will contact with results via phone or letter.    Follow-Up: 1 month   Any Other Special Instructions Will Be Listed Below (If Applicable).  If you need a refill on your cardiac medications before your next appointment, please call your pharmacy.  

## 2020-04-15 ENCOUNTER — Other Ambulatory Visit: Payer: Self-pay | Admitting: Neurology

## 2020-04-15 DIAGNOSIS — M545 Low back pain, unspecified: Secondary | ICD-10-CM

## 2020-04-18 DIAGNOSIS — E119 Type 2 diabetes mellitus without complications: Secondary | ICD-10-CM | POA: Diagnosis not present

## 2020-04-18 DIAGNOSIS — E7849 Other hyperlipidemia: Secondary | ICD-10-CM | POA: Diagnosis not present

## 2020-04-18 DIAGNOSIS — I480 Paroxysmal atrial fibrillation: Secondary | ICD-10-CM | POA: Diagnosis not present

## 2020-04-18 DIAGNOSIS — K2270 Barrett's esophagus without dysplasia: Secondary | ICD-10-CM | POA: Diagnosis not present

## 2020-04-19 ENCOUNTER — Telehealth: Payer: Self-pay | Admitting: Cardiovascular Disease

## 2020-04-19 ENCOUNTER — Ambulatory Visit (INDEPENDENT_AMBULATORY_CARE_PROVIDER_SITE_OTHER): Payer: Medicare Other

## 2020-04-19 ENCOUNTER — Other Ambulatory Visit: Payer: Self-pay

## 2020-04-19 DIAGNOSIS — R0602 Shortness of breath: Secondary | ICD-10-CM

## 2020-04-19 NOTE — Telephone Encounter (Signed)
Asked that [name] call him back today

## 2020-04-19 NOTE — Telephone Encounter (Signed)
Pt is scheduled for back injection on 04/27/20.  They told pt to hold warfarin 4 days prior to procedure.  Needs INR check before procedure.  INR appt made for 04/26/20 at 2:30pm. Pt verbalized understanding.

## 2020-04-21 ENCOUNTER — Telehealth: Payer: Self-pay | Admitting: *Deleted

## 2020-04-21 NOTE — Telephone Encounter (Signed)
-----   Message from Verta Ellen., NP sent at 04/19/2020  5:20 PM EDT ----- Please call patient and tell him pumping function of his heart is good.  Has some very minor mitral valve leaking.  The aortic root is mildly dilated.  This is not a significant finding.  Tell him there is no structural or functional issue that would cause increased shortness of breath from the echo report.  Thank you

## 2020-04-21 NOTE — Telephone Encounter (Signed)
Patient informed. Copy sent to PCP °

## 2020-04-26 ENCOUNTER — Ambulatory Visit (INDEPENDENT_AMBULATORY_CARE_PROVIDER_SITE_OTHER): Payer: Medicare Other | Admitting: *Deleted

## 2020-04-26 ENCOUNTER — Other Ambulatory Visit: Payer: Self-pay

## 2020-04-26 DIAGNOSIS — Z5181 Encounter for therapeutic drug level monitoring: Secondary | ICD-10-CM | POA: Diagnosis not present

## 2020-04-26 DIAGNOSIS — I4891 Unspecified atrial fibrillation: Secondary | ICD-10-CM

## 2020-04-26 LAB — POCT INR: INR: 1.3 — AB (ref 2.0–3.0)

## 2020-04-26 NOTE — Patient Instructions (Signed)
Scheduled for back injection tomorrow.  Took last dose of warfarin on 04/22/20.  Continue to hold warfarin tonight. Take warfarin 1 tablet tomorrow night then resume 1 tablet daily except 1/2 tablet on Sundays and Wednesdays Recheck INR in 2 wks Call us with any new medications, changes or any bleeding concerns @ [phone].

## 2020-04-27 ENCOUNTER — Ambulatory Visit
Admission: RE | Admit: 2020-04-27 | Discharge: 2020-04-27 | Disposition: A | Discharge status: Home / Self Care | Payer: Medicare Other | Source: Ambulatory Visit | Attending: Neurology | Admitting: Neurology

## 2020-04-27 DIAGNOSIS — M545 Low back pain, unspecified: Secondary | ICD-10-CM

## 2020-04-27 MED ORDER — IOPAMIDOL (ISOVUE-M 200) INJECTION 41%
1.0000 mL | Freq: Once | INTRAMUSCULAR | Status: AC
  Administered 2020-04-27: 1 mL via EPIDURAL

## 2020-04-27 MED ORDER — METHYLPREDNISOLONE ACETATE 40 MG/ML INJ SUSP (RADIOLOG
120.0000 mg | Freq: Once | INTRAMUSCULAR | Status: AC
  Administered 2020-04-27: 120 mg via EPIDURAL

## 2020-04-27 NOTE — Discharge Instructions (Signed)
Spinal Injection Discharge Instruction Sheet  1. You may resume a regular diet and any medications that you routinely take, including pain medications.  2. No driving the rest of the day of the procedure.  3. Light activity throughout the rest of the day.  Do not do any strenuous work, exercise, bending or lifting.  The day following the procedure, you may resume normal physical activity but you should refrain from exercising or physical therapy for at least three days.   Common Side Effects:   Headaches- take your usual medications as directed by your physician.     Restlessness or inability to sleep- you may have trouble sleeping for the next few days.  Ask your referring physician if you need any medication for sleep if over the counter sleep medications do not help.   Facial flushing or redness- this should subside within a few days.   Increased pain- a temporary increase in pain a day or two following your procedure is not unusual.  Take your pain medication as prescribed by your referring physician.  You may use ice to the injection site as needed.  Please do not use heat for 24 hours.   Leg cramps  Please contact our office at [phone] for the following symptoms:  Fever greater than 100 degrees.  Headaches unresolved with medication after 2-3 days.  Increased swelling, pain, or redness at injection site.  Thank you for visiting our office.   You may resume Warfarin/Coumadin later tonight before bed.

## 2020-05-04 ENCOUNTER — Other Ambulatory Visit: Payer: Medicare Other

## 2020-05-06 ENCOUNTER — Telehealth: Payer: Self-pay | Admitting: Urology

## 2020-05-06 NOTE — Telephone Encounter (Signed)
Made in error

## 2020-05-08 NOTE — Progress Notes (Signed)
Cardiology Office Note  Date: 05/09/2020   ID: [name], DOB Mar 28, 1941, MRN [id number]  PCP:  Redmond School, MD  Cardiologist:  Kate Sable, MD Electrophysiologist:  None   Chief Complaint: Follow-up paroxysmal atrial fib, CAD, hypertension  History of Present Illness: [name] is a 79 y.o. male with a history of  paroxysmal atrial fib.,  CAD, hypertension.  Cardiac catheterization 07/21/2019 showed patent LM and left circumflex with 40 to 60% proximal LAD and 30 to 40% proximal RCA stenosis with normal LV function.  Aggressive risk factor modification was recommended along with possible sleep study given his snoring.  Last encounter via telemedicine with Dr. Bronson Ing.  Patient his heart raced when he goes up a flight of stairs and has to sit down and rest to recover his breathing.  He stated he had episodes of feeling like he is going to pass out when he sitting down.  Had not checked his blood pressure or blood sugar during his episodes but had episodes of hypoglycemia on other occasions.  Has occasional palpitations and has developed bradycardia in the past with low-dose Toprol and is currently not on beta-blockers.  He was asked to monitor his blood sugar and blood pressure during the episodes when he feels dizzy while sitting down.  Recently presented to PCP office for complaints of fatigue.  CBC was unremarkable.  Glucose was 142, triglycerides 189, HDL 34, LDL 118, TSH was 3.1, Vitamin D 22.1, PSA was high at 8.7.  At last visit he washaving increasing shortness of breath.  Stated he was having less and less energy.  He was taking care of his chronically ill wife.  At his last cardiac catheterization it was suggested that he have a sleep study.  Patient did not want to look into a sleep study.  He was certain he could not wear the device if he was positive for sleep apnea.  He stated when he has to exert the least little bit of exertional activity such as helping  his wife or bending over to pick up something he gets a little dizzy but no near syncopal or syncopal episodes.  His EKG showed atrial flutter with variable AV block rate of 75.  His last echo was June 2019.  He denied any anginal symptoms.  Denied any PND or orthopnea.  No evidence of lower extremity edema.  Today at follow-up he continues to complain of exertional fatigue and fatigue in general.  States he just feels like he does not have any energy.  States he still has shortness of breath when performing more than ADLs such as ascending steps at home.  States he was golfing this weekend and had some exertional dyspnea.  States his primary care provider recently starting him on vitamin D pills for vitamin D deficiency.  He has a 90-pack-year history of smoking although he quit quite a few years ago.  He denies any anginal symptoms.  Has occasional dizziness but no presyncopal or syncopal episodes.  No CVA or TIA-like symptoms, PND, orthopnea, lower extremity edema.  Past Medical History:  Diagnosis Date  . Barrett's esophagus    short-segment, diagnosed in 2007 by Dr. [name]  . BPH (benign prostatic hyperplasia)   . CAD (coronary artery disease)    a. cath on 07/21/2019 showing patent LM and LCx with 40-60% Proximal LAD and 30-40% Pro-RCA stenosis with normal LV function.  [name] GERD (gastroesophageal reflux disease)   . Hiatal hernia    small  .  HTN (hypertension)   . PAF (paroxysmal atrial fibrillation) ([place])   . S/P colonoscopy 2007   unremarkable  . S/P endoscopy Oct 2011    Salmon-colored epithelium coming up to 37 cm from the   . Vertigo     Past Surgical History:  Procedure Laterality Date  . APPENDECTOMY    . BIOPSY  01/28/2020   Procedure: BIOPSY;  Surgeon: [name], MD;  [facility];  Service: Endoscopy;;  esophagus  . CHOLECYSTECTOMY    . COLONOSCOPY  02/06/2006   Dr. [name]- single anal papilla, o/w normal rectum, normal colon  . COLONOSCOPY N/A 09/13/2016    RMR: three 5-6 mm for adenomas removed.  Recommend one last surveillance colonoscopy in October 2020.  [name] COLONOSCOPY WITH PROPOFOL N/A 01/28/2020   Procedure: COLONOSCOPY WITH PROPOFOL;  Surgeon: [name], MD;  [facility];  Service: Endoscopy;  Laterality: N/A;  10:45am  . ESOPHAGOGASTRODUODENOSCOPY  09/15/2010   Dr. [name]- Salmon-colored epithelium coming up to 37 cm from the   . ESOPHAGOGASTRODUODENOSCOPY N/A 07/02/2013   Dr. [name]- Barretts on bx. hiatal hernia  . ESOPHAGOGASTRODUODENOSCOPY N/A 09/13/2016   RMR: Barrett's esophagus without dysplasia. one last EGD in 3 years.   . ESOPHAGOGASTRODUODENOSCOPY (EGD) WITH PROPOFOL N/A 01/28/2020   Procedure: ESOPHAGOGASTRODUODENOSCOPY (EGD) WITH PROPOFOL;  Surgeon: [name], MD;  [facility];  Service: Endoscopy;  Laterality: N/A;  . INTRAVASCULAR PRESSURE WIRE/FFR STUDY N/A 07/21/2019   Procedure: INTRAVASCULAR PRESSURE WIRE/FFR STUDY;  Surgeon: [name], MD;  Location: [facility] [place] [facility];  Service: Cardiovascular;  Laterality: N/A;  . LEFT HEART CATH AND CORONARY ANGIOGRAPHY N/A 07/21/2019   Procedure: LEFT HEART CATH AND CORONARY ANGIOGRAPHY;  Surgeon: [name], MD;  Location: [place] [facility];  Service: Cardiovascular;  Laterality: N/A;  . POLYPECTOMY  01/28/2020   Procedure: POLYPECTOMY;  Surgeon: [name], MD;  [facility];  Service: Endoscopy;;  colon  . ROTATOR CUFF REPAIR    . YAG LASER APPLICATION Right 0/06/6760   Procedure: YAG LASER APPLICATION;  Surgeon: [name], MD;  Location: [facility];  Service: Ophthalmology;  Laterality: Right;    Current Outpatient Medications  Medication Sig Dispense Refill  . acetaminophen (TYLENOL) 500 MG tablet Take 1,000 mg by mouth every 6 (six) hours as needed for moderate pain.    [name] allopurinol (ZYLOPRIM) 100 MG tablet Take 100 mg by mouth daily.     [name] ALPRAZolam (XANAX) 1 MG tablet Take 0.5 mg by mouth at bedtime as needed for  anxiety or sleep.     [name] atorvastatin (LIPITOR) 40 MG tablet Take 40 mg by mouth daily.     . Coenzyme Q10 100 MG capsule CoQ-10 100 mg capsule  Take by oral route.    . DULoxetine (CYMBALTA) 30 MG capsule Cymbalta 30 mg capsule,delayed release  Take 1 capsule every day by oral route.    . fexofenadine (ALLEGRA) 180 MG tablet Take 180 mg by mouth daily.    [name] gabapentin (NEURONTIN) 300 MG capsule Take 300 mg by mouth 2 (two) times daily.     [name] glipiZIDE (GLUCOTROL) 5 MG tablet Take 2.5 mg by mouth daily before breakfast.   4  . HYDROcodone-acetaminophen (NORCO) 7.5-325 MG tablet hydrocodone 7.5 mg-acetaminophen 325 mg tablet  Take 1 tablet twice a day by oral route as needed.    . metoprolol tartrate (LOPRESSOR) 50 MG tablet Take 0.5 tablets (25 mg total) by  mouth daily.    . pantoprazole (PROTONIX) 40 MG tablet Take 1 tablet (40 mg total) by mouth 2 (two) times daily before a meal. 180 tablet 3  . Tamsulosin HCl (FLOMAX) 0.4 MG CAPS Take 0.4 mg by mouth 2 (two) times daily.     . traMADol (ULTRAM) 50 MG tablet Take 50 mg by mouth as needed.    . Vitamin D, Ergocalciferol, (DRISDOL) 1.25 MG (50000 UNIT) CAPS capsule TAKE ONE CAPSULE (50,000 UNITS) BY MOUTH ONCE WEEKLY. ONCE THIS PRESCRIPTION IS COMPLETE, THEN TAKE 2000 UNITS VIT D OVER THE COUNTER ONCE D    . warfarin (COUMADIN) 5 MG tablet TAKE ONE TABLET BY MOUTH DAILY EXCEPT ONE-HALF TABLET ON TUESDAYS AND FRIDAYS OR AS DIRECTED (Patient taking differently: Take 7.5 mg by mouth See admin instructions. Take 7.5 mg on Thurs and Fri, take 5 mg on Sat then hold for the procedure) 45 tablet 4   No current facility-administered medications for this visit.   Allergies:  Patient has no known allergies.   Social History: The patient  reports that he quit smoking about 26 years ago. His smoking use included cigarettes. He has a 45.00 pack-year smoking history. He has never used smokeless tobacco. He reports that he does not drink alcohol and does not  use drugs.   Family History: The patient's family history includes Prostate cancer in his father; Stroke in his mother.   ROS:  Please see the history of present illness. Otherwise, complete review of systems is positive for none.  All other systems are reviewed and negative.   Physical Exam: VS:  BP 118/60   Pulse 63   Ht 5\' 11"  (1.803 m)   Wt 213 lb 6.4 oz (96.8 kg)   SpO2 97%   BMI 29.76 kg/m , BMI Body mass index is 29.76 kg/m.  Wt Readings from Last 3 Encounters:  05/09/20 213 lb 6.4 oz (96.8 kg)  04/07/20 217 lb 3.2 oz (98.5 kg)  02/09/20 212 lb (96.2 kg)    General: Patient appears comfortable at rest. Neck: Supple, no elevated JVP or carotid bruits, no thyromegaly. Lungs: Clear to auscultation, nonlabored breathing at rest. Cardiac: Irregularly irregular rate and rhythm, no S3 or significant systolic murmur, no pericardial rub. Extremities: No pitting edema, distal pulses 2+. Skin: Warm and dry. Musculoskeletal: No kyphosis. Neuropsychiatric: Alert and oriented x3, affect grossly appropriate.  ECG:  An ECG dated 04/07/2020 was personally reviewed today and demonstrated:  Atrial flutter with variable AV block, RSR prime or QR pattern in V1 suggests right ventricular conduction delay rate of 75  Recent Labwork: 01/26/2020: BUN 19; Creatinine, Ser 1.06; Hemoglobin 14.2; Platelets 192; Potassium 3.7; Sodium 135  No results found for: CHOL, TRIG, HDL, CHOLHDL, VLDL, LDLCALC, LDLDIRECT  Other Studies Reviewed Today:  Echocardiogram  04/19/2020  1. Left ventricular ejection fraction, by estimation, is 65 to 70%. The left ventricle has normal function. The left ventricle has no regional wall motion abnormalities. Left ventricular diastolic parameters were normal. 2. Right ventricular systolic function is normal. The right ventricular size is normal. There is normal pulmonary artery systolic pressure. The estimated right ventricular systolic pressure is 62.3 mmHg. 3. The  mitral valve is grossly normal. Trivial mitral valve regurgitation. 4. The aortic valve is tricuspid. Aortic valve regurgitation is trivial. 5. Aortic dilatation noted. There is mild dilatation of the aortic root. 6. The inferior vena cava is normal in size with greater than 50% respiratory variability, suggesting right atrial pressure of 3 mmHg.RIGHT  CAROTID ARTERY: There is no significant stenosis. There is no significant atherosclerotic disease.  Carotid artery duplex 08/28/2019  RIGHT VERTEBRAL ARTERY:  Antegrade flow is noted.  LEFT CAROTID ARTERY: There is mild soft plaque within the mid left CCA without evidence for a significant stenosis. There is some mild mixed calcified and noncalcified plaque in the carotid bulb without evidence for significant stenosis.  LEFT VERTEBRAL ARTERY:  Antegrade flow is noted  IMPRESSION: Less than 50% stenosis of the bilateral ICAs.   Cardiac Catheterization 07/21/2019   Widely patent left main  40 to 60% proximal LAD with mild calcification.  DFR on LAD stenosis 0.94.  Widely patent circumflex  Very tortuous proximal right coronary with 30 to 40% proximal narrowing.  No significant obstruction identified.  Normal LV function.  RECOMMENDATIONS:   Given the location of the LAD stenosis, he needs aggressive preventive therapy which could include high intensity statin therapy, VASCEPA9 (if TG > 140), and SGLT2.  The patient does snore and perhaps some of his complaints are related to sleep deprivation from obstructive sleep apnea.  Consider sleep study.  Resume anticoagulation therapy this evening.  Diagnostic Dominance: Right    Assessment and Plan:    1. Atrial flutter, unspecified type (HCC)  Continue Coumadin 5 mg by mouth daily except 1/2 tablet on Tuesdays and Fridays.    Decrease metoprolol to 25 mg daily.  Complaining of significant fatigue and increased activity intolerance..   3. CAD in native artery Cardiac  catheterization 07/30/2019 showed widely patent left main, 40 to 60% proximal LAD with mild calcification.  DFR on LAD stenosis 0.94, widely patent circumflex, very tortuous proximal right coronary artery with 30 to 40% proximal narrowing.  No significant obstruction identified, normal LV function.  Patient denies any typical anginal symptoms.  Continue atorvastatin 40 mg daily.   4. Pre-syncope Patient denies any feelings of faintness but does complain of dizziness when bending over and attempting to stand erect.  He has never fallen or passed out per his statement.  He does  have a significant history of vertigo per his statement.  States he is not had any significant vertigo recently.  5. SOB (shortness of breath) Continues to complain of increasing shortness of breath.  Has a 90-pack-year of smoking 2 packs/day x 45 years.  States he quit quite a few years ago.  Please refer to [facility] pulmonology for evaluation of shortness of breath.  Medication Adjustments/Labs and Tests Ordered: Current medicines are reviewed at length with the patient today.  Concerns regarding medicines are outlined above.   Disposition: Follow-up with Dr. Bronson Ing or APP 3 months  Signed, [name] July, NP 05/09/2020 1:52 PM    [facility] at [address], [place], [place] [address] Phone: [phone]; Fax: [phone]

## 2020-05-09 ENCOUNTER — Encounter: Payer: Self-pay | Admitting: Family Medicine

## 2020-05-09 ENCOUNTER — Ambulatory Visit (INDEPENDENT_AMBULATORY_CARE_PROVIDER_SITE_OTHER): Payer: Medicare Other | Admitting: Family Medicine

## 2020-05-09 ENCOUNTER — Ambulatory Visit (INDEPENDENT_AMBULATORY_CARE_PROVIDER_SITE_OTHER): Payer: Medicare Other | Admitting: *Deleted

## 2020-05-09 ENCOUNTER — Other Ambulatory Visit: Payer: Self-pay

## 2020-05-09 VITALS — BP 118/60 | HR 63 | Ht 71.0 in | Wt 213.4 lb

## 2020-05-09 DIAGNOSIS — R0602 Shortness of breath: Secondary | ICD-10-CM

## 2020-05-09 DIAGNOSIS — Z5181 Encounter for therapeutic drug level monitoring: Secondary | ICD-10-CM

## 2020-05-09 DIAGNOSIS — I4891 Unspecified atrial fibrillation: Secondary | ICD-10-CM

## 2020-05-09 LAB — POCT INR: INR: 1.9 — AB (ref 2.0–3.0)

## 2020-05-09 MED ORDER — METOPROLOL TARTRATE 50 MG PO TABS: 25.0000 mg | ORAL_TABLET | Freq: Every day | ORAL | Status: DC

## 2020-05-09 NOTE — Patient Instructions (Signed)
Medication Instructions:   Decrease Lopressor to 1/2 tab daily.   Continue all other medications.    Labwork: none  Testing/Procedures: none  Follow-Up: 3 months   Any Other Special Instructions Will Be Listed Below (If Applicable). You have been referred to:  Pulmonology   If you need a refill on your cardiac medications before your next appointment, please call your pharmacy.

## 2020-05-09 NOTE — Patient Instructions (Signed)
Take warfarin 1 1/2 tablets tonight then resume 1 tablet daily except 1/2 tablet on Sundays and Wednesdays Recheck INR in 4 wks Call us with any new medications, changes or any bleeding concerns @ [phone].

## 2020-05-18 DIAGNOSIS — E119 Type 2 diabetes mellitus without complications: Secondary | ICD-10-CM | POA: Diagnosis not present

## 2020-05-18 DIAGNOSIS — E7849 Other hyperlipidemia: Secondary | ICD-10-CM | POA: Diagnosis not present

## 2020-05-18 DIAGNOSIS — I480 Paroxysmal atrial fibrillation: Secondary | ICD-10-CM | POA: Diagnosis not present

## 2020-05-18 DIAGNOSIS — K2270 Barrett's esophagus without dysplasia: Secondary | ICD-10-CM | POA: Diagnosis not present

## 2020-05-24 DIAGNOSIS — E538 Deficiency of other specified B group vitamins: Secondary | ICD-10-CM | POA: Diagnosis not present

## 2020-05-24 DIAGNOSIS — E663 Overweight: Secondary | ICD-10-CM | POA: Diagnosis not present

## 2020-05-24 DIAGNOSIS — Z6825 Body mass index (BMI) 25.0-25.9, adult: Secondary | ICD-10-CM | POA: Diagnosis not present

## 2020-05-24 DIAGNOSIS — E559 Vitamin D deficiency, unspecified: Secondary | ICD-10-CM | POA: Diagnosis not present

## 2020-06-06 ENCOUNTER — Ambulatory Visit (INDEPENDENT_AMBULATORY_CARE_PROVIDER_SITE_OTHER): Payer: Medicare Other | Admitting: *Deleted

## 2020-06-06 DIAGNOSIS — I4891 Unspecified atrial fibrillation: Secondary | ICD-10-CM

## 2020-06-06 DIAGNOSIS — Z5181 Encounter for therapeutic drug level monitoring: Secondary | ICD-10-CM

## 2020-06-06 LAB — POCT INR: INR: 1.9 — AB (ref 2.0–3.0)

## 2020-06-06 NOTE — Patient Instructions (Signed)
Take warfarin 1 1/2 tablets tonight then increase dose to 1 tablet daily except 1/2 tablet on  Wednesdays Recheck INR in 4 wks Call us with any new medications, changes or any bleeding concerns @ [phone]

## 2020-06-09 ENCOUNTER — Other Ambulatory Visit: Payer: Self-pay

## 2020-06-09 ENCOUNTER — Telehealth: Payer: Self-pay | Admitting: Urology

## 2020-06-09 DIAGNOSIS — R39198 Other difficulties with micturition: Secondary | ICD-10-CM

## 2020-06-09 MED ORDER — TAMSULOSIN HCL 0.4 MG PO CAPS: 0.4000 mg | ORAL_CAPSULE | Freq: Two times a day (BID) | ORAL | 2 refills | Status: DC

## 2020-06-09 NOTE — Telephone Encounter (Signed)
Per pharmacist Jonni Sanger pt lost medication and needed a new script and will be paying cash for it. Rx sent.

## 2020-06-09 NOTE — Telephone Encounter (Signed)
ANDY AT THE PHARMACY CALLED AND ASKED FOR A NURSE TO RETURN HIS CALL.

## 2020-06-13 ENCOUNTER — Other Ambulatory Visit: Payer: Self-pay | Admitting: *Deleted

## 2020-06-13 MED ORDER — WARFARIN SODIUM 5 MG PO TABS: ORAL_TABLET | ORAL | 2 refills | Status: DC

## 2020-06-17 DIAGNOSIS — K2270 Barrett's esophagus without dysplasia: Secondary | ICD-10-CM | POA: Diagnosis not present

## 2020-06-17 DIAGNOSIS — E7849 Other hyperlipidemia: Secondary | ICD-10-CM | POA: Diagnosis not present

## 2020-06-17 DIAGNOSIS — E119 Type 2 diabetes mellitus without complications: Secondary | ICD-10-CM | POA: Diagnosis not present

## 2020-06-17 DIAGNOSIS — I480 Paroxysmal atrial fibrillation: Secondary | ICD-10-CM | POA: Diagnosis not present

## 2020-06-28 ENCOUNTER — Other Ambulatory Visit: Payer: Self-pay

## 2020-06-28 ENCOUNTER — Ambulatory Visit (INDEPENDENT_AMBULATORY_CARE_PROVIDER_SITE_OTHER): Payer: Medicare Other | Admitting: Internal Medicine

## 2020-06-28 ENCOUNTER — Encounter: Payer: Self-pay | Admitting: Internal Medicine

## 2020-06-28 DIAGNOSIS — I2510 Atherosclerotic heart disease of native coronary artery without angina pectoris: Secondary | ICD-10-CM | POA: Diagnosis not present

## 2020-06-28 DIAGNOSIS — R0609 Other forms of dyspnea: Secondary | ICD-10-CM

## 2020-06-28 DIAGNOSIS — R0600 Dyspnea, unspecified: Secondary | ICD-10-CM | POA: Diagnosis not present

## 2020-06-28 NOTE — Progress Notes (Signed)
[name], male    DOB: 06-24-1941,    MRN: [id number]   Brief patient profile:  47 yowm retired [employer] office/ [employer] quit smoking in 1994 and some problem with nasal congestion that resolved  On daily allegra  then around 2018/19 doe indolent onset assoc with bilateral neuropathy and chronic back pain  referred to pulmonary clinic in [place]  06/28/2020 by Dr    Gerarda Fraction    History of Present Illness  06/28/2020  Pulmonary/ 1st office eval/Tylasia Fletchall  Chief Complaint  Patient presents with  . Consult    shortness of breath with exertion  Dyspnea:  Uses HC / does ok shopping but has to stop at top where didn't previously assoc  feels light headed never supine  Cough: some in am  Sleep: on flat bed/ either side, 2 pillows  SABA use: none  No obvious day to day or daytime variability or assoc excess/ purulent sputum or mucus plugs or hemoptysis or cp or chest tightness, subjective wheeze or overt sinus or hb symptoms.   Sleeping  without nocturnal  or early am exacerbation  of respiratory  c/o's or need for noct saba. Also denies any obvious fluctuation of symptoms with weather or environmental changes or other aggravating or alleviating factors except as outlined above   No unusual exposure hx or h/o childhood pna/ asthma or knowledge of premature birth.  Current Allergies, Complete Past Medical History, Past Surgical History, Family History, and Social History were reviewed in [employer] record.  ROS  The following are not active complaints unless bolded Hoarseness, sore throat, dysphagia, dental problems, itching, sneezing,  nasal congestion or discharge of excess mucus or purulent secretions, ear ache,   fever, chills, sweats, unintended wt loss or wt gain, classically pleuritic or exertional cp,  orthopnea pnd or arm/hand swelling  or leg swelling, presyncope, palpitations, abdominal pain, anorexia, nausea, vomiting, diarrhea  or change in bowel habits  or change in bladder habits, change in stools or change in urine, dysuria, hematuria,  rash, arthralgias, visual complaints, headache, numbness, weakness or ataxia or problems with walking or coordination,  change in mood or  memory.          Past Medical History:  Diagnosis Date  . Barrett's esophagus    short-segment, diagnosed in 2007 by Dr. [name]  . BPH (benign prostatic hyperplasia)   . CAD (coronary artery disease)    a. cath on 07/21/2019 showing patent LM and LCx with 40-60% Proximal LAD and 30-40% Pro-RCA stenosis with normal LV function.  [name] GERD (gastroesophageal reflux disease)   . Hiatal hernia    small  . HTN (hypertension)   . PAF (paroxysmal atrial fibrillation) ([place])   . S/P colonoscopy 2007   unremarkable  . S/P endoscopy Oct 2011    Salmon-colored epithelium coming up to 37 cm from the   . Vertigo     Outpatient Medications Prior to Visit  Medication Sig Dispense Refill  . allopurinol (ZYLOPRIM) 100 MG tablet Take 100 mg by mouth daily.     [name] ALPRAZolam (XANAX) 1 MG tablet Take 0.5 mg by mouth at bedtime as needed for anxiety or sleep.     [name] atorvastatin (LIPITOR) 40 MG tablet Take 40 mg by mouth daily.     . Coenzyme Q10 100 MG capsule CoQ-10 100 mg capsule  Take by oral route.    . DULoxetine (CYMBALTA) 30 MG capsule Cymbalta 30 mg capsule,delayed release  Take 1  capsule every day by oral route.    . fexofenadine (ALLEGRA) 180 MG tablet Take 180 mg by mouth daily.    [name] gabapentin (NEURONTIN) 300 MG capsule Take 300 mg by mouth 2 (two) times daily.     [name] glipiZIDE (GLUCOTROL) 5 MG tablet Take 2.5 mg by mouth daily before breakfast.   4  . HYDROcodone-acetaminophen (NORCO) 7.5-325 MG tablet hydrocodone 7.5 mg-acetaminophen 325 mg tablet  Take 1 tablet twice a day by oral route as needed.    . metoprolol tartrate (LOPRESSOR) 50 MG tablet Take 0.5 tablets (25 mg total) by mouth daily.    . pantoprazole (PROTONIX) 40 MG tablet Take 1 tablet (40 mg total) by mouth 2  (two) times daily before a meal. 180 tablet 3  . tamsulosin (FLOMAX) 0.4 MG CAPS capsule Take 1 capsule (0.4 mg total) by mouth 2 (two) times daily. 30 capsule 2  . traMADol (ULTRAM) 50 MG tablet Take 50 mg by mouth as needed.    . Vitamin D, Ergocalciferol, (DRISDOL) 1.25 MG (50000 UNIT) CAPS capsule TAKE ONE CAPSULE (50,000 UNITS) BY MOUTH ONCE WEEKLY. ONCE THIS PRESCRIPTION IS COMPLETE, THEN TAKE 2000 UNITS VIT D OVER THE COUNTER ONCE D    . warfarin (COUMADIN) 5 MG tablet TAKE ONE TABLET BY MOUTH DAILY EXCEPT ONE-HALF TABLET ON WEDNESDAYS OR AS DIRECTED 90 tablet 2  . acetaminophen (TYLENOL) 500 MG tablet Take 1,000 mg by mouth every 6 (six) hours as needed for moderate pain. (Patient not taking: Reported on 06/28/2020)     No facility-administered medications prior to visit.     Objective:     BP 128/72 (BP Location: Left Arm, Cuff Size: Normal)   Pulse 70   Temp 97.6 F (36.4 C) (Oral)   Ht 5\' 11"  (1.803 m)   Wt 213 lb 3.2 oz (96.7 kg)   SpO2 97% Comment: Room air  BMI 29.74 kg/m   SpO2: 97 % (Room air)   amb mod obese wm nad   HEENT : pt wearing mask not removed for exam due to covid -19 concerns.    NECK :  without JVD/Nodes/TM/ nl carotid upstrokes bilaterally   LUNGS: no acc muscle use,  Nl contour chest which is clear to A and P bilaterally without cough on insp or exp maneuvers   CV:  RRR  no s3 or murmur or increase in P2, and no edema   ABD:  soft and nontender with nl inspiratory excursion in the supine position. No bruits or organomegaly appreciated, bowel sounds nl  MS:  slt awkard  gait/ ext warm without deformities, calf tenderness, cyanosis or clubbing No obvious joint restrictions   SKIN: warm and dry without lesions    NEURO:  alert, approp, nl sensorium with  no motor or cerebellar deficits apparent.      CXR PA and Lateral:   06/28/2020 :    I personally reviewed images and agree with radiology impression as follows:         Assessment    No problem-specific Assessment & Plan notes found for this encounter.     Christinia Gully, MD 06/28/2020

## 2020-06-28 NOTE — Patient Instructions (Addendum)
Please remember to go to the  x-ray department when you can at [facility]   for your tests - we will call you with the results when they are available     Discuss your lightheaded problem with Dr Jeffie Pollock - it may be from your Flomax combined with your neuropathy    We will need to set you with PFT's  Next available and I will call you to set up what else is needed

## 2020-06-28 NOTE — Assessment & Plan Note (Addendum)
Onset around 2019 assoc neuropathy - Stopped smoking 1994 with only chronic resp symptom p stopped = rhinitis better with allegrea - Echo 04/19/20  Nl lv, trivial MR with nl LA and RA  -  06/28/2020   Walked RA  approx   avg ft  @ 600 pace  stopped due to sob with sats still 97% and no orthostatic complaints   Symptoms are markedly disproportionate to objective findings and not clear to what extent this is actually a pulmonary  problem but pt does appear to have difficult to sort out respiratory symptoms of unknown origin for which  DDX  = almost all start with A and  include Adherence, Ace Inhibitors, Acid Reflux, Active Sinus Disease, Alpha 1 Antitripsin deficiency, Anxiety masquerading as Airways dz,  ABPA,  Allergy(esp in young), Aspiration (esp in elderly), Adverse effects of meds,  Active smoking or Vaping, A bunch of PE's/clot burden (a few small clots can't cause this syndrome unless there is already severe underlying pulm or vascular dz with poor reserve),  Anemia or thyroid disorder, plus two Bs  = Bronchiectasis and Beta blocker use..and one C= CHF     Adherence is always the initial "prime suspect" and is a multilayered concern that requires a "trust but verify" approach in every patient - starting with knowing how to use medications, especially inhalers, correctly, keeping up with refills and understanding the fundamental difference between maintenance and prns vs those medications only taken for a very short course and then stopped and not refilled.   ? Allergy/asthma/ copd > pfts pending   When respiratory symptoms begin or become refractory well after a patient reports complete smoking cessation,  Especially when this wasn't the case while they were smoking, a red flag is raised based on the work of Dr Kris Mouton which states:  if you quit smoking when your best day FEV1 is still well preserved it is highly unlikely you will progress to severe disease.  That is to say, once the smoking  stops,  the symptoms should not suddenly erupt or markedly worsen.  If so, the differential diagnosis should include  obesity/deconditioning,  LPR/Reflux/Aspiration syndromes,  occult CHF, or  especially side effect of medications commonly used in this population  ? Adverse effects of meds > none of the usual suspects   ? Anemia/ thyroid dz   Lab Results  Component Value Date   HGB 14.2 01/26/2020   HGB 13.7 11/25/2019   HGB 12.6 (L) 10/19/2019    Lab Results  Component Value Date   TSH 3.235 05/02/2018      ? A bunch of PE's > unlikely on coumadin with nl RV and RA and PAS  ? chf > unlikely by echo   >>> favor obesity/ deconditioning brought on by less activity p neuropathy developed   rec cxr/ pfts to complete the w/u           Each maintenance medication was reviewed in detail including emphasizing most importantly the difference between maintenance and prns and under what circumstances the prns are to be triggered using an action plan format where appropriate.  Total time for H and P, chart review, counseling,  directly observing portions of ambulatory 02 saturation study/  and generating customized AVS unique to this office visit / charting = 45 min

## 2020-06-30 NOTE — Progress Notes (Signed)
H&P  Chief Complaint: Dysuria   History of Present Illness:   07/01/20: [name] returns today in f/u.  He reports resolution of the penile lesion.   He continues to have nocturia but he is getting up a lot with his wife who has to get up.   He remains on tamsulosin.  His IPSS is 24.  He has been having some progressive SOB and Dr. Melvyn Novas had some questions about whether the tamsulosin may be contributing to the breathing issues.   His PSA in 1/21 was 6.3 which was down from 9.7.  He has some LLQ discomfort at times.  He has a horseshoe kidney and a right renal stone on a CT in 2015.   3.23.2021: Most recent PSA was 6.3 on 1.5.2021. He presents today c/o of sore spot underneath his penis that he has had for the last 2 wks. This has been too painful to touch. He has tried managing pain with medications and hot baths without success. He has also tried to "pop" it without producing any drainage. This pain has started to lessen in severity but he has recently begun having some mild dysuria and increased nocturia. Otherwise, his urinary sx's are stable.   (below copied from [facility] records):  Elevated PSA:  [name] is a 79 year-old male established patient who is here for an elevated PSA.  His PSA is 6. He has not had a prostate nodule on a physical examination. He has not had recurrent prostate infections or chronic prostatitis.   He has not been on antibiotics for prostate infections previously. He has had a prostate biopsy done. His first prostate biopsy was done approximately 09/19/2010. He does have the pathology report from his biopsy.   He has a history of an elevated PSA with a negative biopsy in 2011. His PSA was 4.87 prior to the biopsy but declined on finasteride to 3.17 but he is off of that and the level has been rising slowly   04/24/19: [name] returns today in f/u for his history of an elevated PSA with a negative biopsy in 2011. His PSA was back down to 6.4 from 9.7 prior to this visit.     He is currently on prednisone for gout.    01/27/19 10/23/18 10/17/17 09/18/16 09/06/15 03/02/15 04/03/14 06/13/12  PSA  Total PSA 6.4 ng/dl 9.7 ng/dl 5.3 ng/dl 5.8  4.66  5.64  5.20  4.36   Free PSA    1.0  0.56  0.58  0.64  0.62   % Free PSA  8 %  17  12  10  12  14     BPH:  He is currently taking tamsulosin. He is not on new medications for symptoms of prostate enlargement.   He does have an abnormal sensation when needing to urinate. He is not having problems getting his urine stream started. He does not have a good size and strength to his urinary stream. He is not having problems with emptying his bladder well. He does not dribble at the end of urination.   04/24/19: He has stable moderate to severe LUTS with an IPSS of 20 on tamsulosin. he has nocturia x 3 and a reduced stream. He had been on finasteride remotely. He has no associated signs or symptoms.  Past Medical History:  Diagnosis Date  . Barrett's esophagus    short-segment, diagnosed in 2007 by Dr. [name]  . BPH (benign prostatic hyperplasia)   . CAD (coronary artery disease)  a. cath on 07/21/2019 showing patent LM and LCx with 40-60% Proximal LAD and 30-40% Pro-RCA stenosis with normal LV function.  [name] GERD (gastroesophageal reflux disease)   . Hiatal hernia    small  . HTN (hypertension)   . PAF (paroxysmal atrial fibrillation) ([place])   . S/P colonoscopy 2007   unremarkable  . S/P endoscopy Oct 2011    Salmon-colored epithelium coming up to 37 cm from the   . Vertigo     Past Surgical History:  Procedure Laterality Date  . APPENDECTOMY    . BIOPSY  01/28/2020   Procedure: BIOPSY;  Surgeon: [name], MD;  [facility];  Service: Endoscopy;;  esophagus  . CHOLECYSTECTOMY    . COLONOSCOPY  02/06/2006   Dr. [name]- single anal papilla, o/w normal rectum, normal colon  . COLONOSCOPY N/A 09/13/2016   RMR: three 5-6 mm for adenomas removed.  Recommend one last surveillance colonoscopy in October  2020.  [name] COLONOSCOPY WITH PROPOFOL N/A 01/28/2020   Procedure: COLONOSCOPY WITH PROPOFOL;  Surgeon: [name], MD;  [facility];  Service: Endoscopy;  Laterality: N/A;  10:45am  . ESOPHAGOGASTRODUODENOSCOPY  09/15/2010   Dr. [name]- Salmon-colored epithelium coming up to 37 cm from the   . ESOPHAGOGASTRODUODENOSCOPY N/A 07/02/2013   Dr. [name]- Barretts on bx. hiatal hernia  . ESOPHAGOGASTRODUODENOSCOPY N/A 09/13/2016   RMR: Barrett's esophagus without dysplasia. one last EGD in 3 years.   . ESOPHAGOGASTRODUODENOSCOPY (EGD) WITH PROPOFOL N/A 01/28/2020   Procedure: ESOPHAGOGASTRODUODENOSCOPY (EGD) WITH PROPOFOL;  Surgeon: [name], MD;  [facility];  Service: Endoscopy;  Laterality: N/A;  . INTRAVASCULAR PRESSURE WIRE/FFR STUDY N/A 07/21/2019   Procedure: INTRAVASCULAR PRESSURE WIRE/FFR STUDY;  Surgeon: [name], MD;  Location: [place] [facility];  Service: Cardiovascular;  Laterality: N/A;  . LEFT HEART CATH AND CORONARY ANGIOGRAPHY N/A 07/21/2019   Procedure: LEFT HEART CATH AND CORONARY ANGIOGRAPHY;  Surgeon: [name], MD;  Location: [place] [facility];  Service: Cardiovascular;  Laterality: N/A;  . POLYPECTOMY  01/28/2020   Procedure: POLYPECTOMY;  Surgeon: [name], MD;  [facility];  Service: Endoscopy;;  colon  . ROTATOR CUFF REPAIR    . YAG LASER APPLICATION Right 06/23/4626   Procedure: YAG LASER APPLICATION;  Surgeon: [name], MD;  Location: [facility];  Service: Ophthalmology;  Laterality: Right;    Home Medications:  Allergies as of 07/01/2020   No Known Allergies     Medication List       Accurate as of July 01, 2020 11:53 AM. If you have any questions, ask your nurse or doctor.        acetaminophen 500 MG tablet Commonly known as: TYLENOL Take 1,000 mg by mouth every 6 (six) hours as needed for moderate pain.   allopurinol 100 MG tablet Commonly known as: ZYLOPRIM Take 100 mg by mouth daily.     ALPRAZolam 1 MG tablet Commonly known as: XANAX Take 0.5 mg by mouth at bedtime as needed for anxiety or sleep.   atorvastatin 40 MG tablet Commonly known as: LIPITOR Take 40 mg by mouth daily.   Coenzyme Q10 100 MG capsule CoQ-10 100 mg capsule  Take by oral route.   Cymbalta 30 MG capsule Generic drug: DULoxetine Cymbalta 30 mg capsule,delayed release  Take 1 capsule every day by oral route.   fexofenadine 180 MG tablet Commonly known as: ALLEGRA Take 180 mg by mouth daily.  gabapentin 300 MG capsule Commonly known as: NEURONTIN Take 300 mg by mouth 2 (two) times daily.   glipiZIDE 5 MG tablet Commonly known as: GLUCOTROL Take 2.5 mg by mouth daily before breakfast.   HYDROcodone-acetaminophen 7.5-325 MG tablet Commonly known as: NORCO hydrocodone 7.5 mg-acetaminophen 325 mg tablet  Take 1 tablet twice a day by oral route as needed.   metoprolol tartrate 50 MG tablet Commonly known as: LOPRESSOR Take 0.5 tablets (25 mg total) by mouth daily.   pantoprazole 40 MG tablet Commonly known as: PROTONIX Take 1 tablet (40 mg total) by mouth 2 (two) times daily before a meal.   tamsulosin 0.4 MG Caps capsule Commonly known as: FLOMAX Take 1 capsule (0.4 mg total) by mouth 2 (two) times daily.   traMADol 50 MG tablet Commonly known as: ULTRAM Take 50 mg by mouth as needed.   Vitamin D (Ergocalciferol) 1.25 MG (50000 UNIT) Caps capsule Commonly known as: DRISDOL TAKE ONE CAPSULE (50,000 UNITS) BY MOUTH ONCE WEEKLY. ONCE THIS PRESCRIPTION IS COMPLETE, THEN TAKE 2000 UNITS VIT D OVER THE COUNTER ONCE D   warfarin 5 MG tablet Commonly known as: COUMADIN Take as directed by the anticoagulation clinic. If you are unsure how to take this medication, talk to your nurse or doctor. Original instructions: TAKE ONE TABLET BY MOUTH DAILY EXCEPT ONE-HALF TABLET ON WEDNESDAYS OR AS DIRECTED       Allergies: No Known Allergies  Family History  Problem Relation Age of Onset   . Prostate cancer Father        deceased  . Stroke Mother        deceased  . Colon cancer Neg Hx     Social History:  reports that he quit smoking about 26 years ago. His smoking use included cigarettes. He has a 45.00 pack-year smoking history. He has never used smokeless tobacco. He reports that he does not drink alcohol and does not use drugs.  ROS: A complete review of systems was performed.  All systems are negative except for pertinent findings as noted.  Physical Exam:  Vital signs in last 24 hours: BP 120/64   Pulse 70   Temp 98.5 F (36.9 C)   Wt 214 lb (97.1 kg)   BMI 29.85 kg/m  Constitutional:  Alert and oriented, No acute distress, Obese   Laboratory Data:  UA is unremarkable.       Impression/Assessment:  Balanitis-- resolved  BPH with BOO and Nocturia.  He has stable LUTS on tamsulosin.  I think it would be unlikely that the tamsulosin is contributing to his SOB with no signs of fluid overload.    Horseshoe kidney with right renal stone on CT in 2015.  He has chronic left flank and lower quadrant pain.  I will get a CT stone study to reassessment.   Elevated PSA.  Repeat in 6 months.

## 2020-07-01 ENCOUNTER — Ambulatory Visit (INDEPENDENT_AMBULATORY_CARE_PROVIDER_SITE_OTHER): Payer: Medicare Other | Admitting: Urology

## 2020-07-01 ENCOUNTER — Other Ambulatory Visit: Payer: Self-pay

## 2020-07-01 ENCOUNTER — Encounter: Payer: Self-pay | Admitting: Urology

## 2020-07-01 VITALS — BP 120/64 | HR 70 | Temp 98.5°F | Wt 214.0 lb

## 2020-07-01 DIAGNOSIS — Q631 Lobulated, fused and horseshoe kidney: Secondary | ICD-10-CM

## 2020-07-01 DIAGNOSIS — N401 Enlarged prostate with lower urinary tract symptoms: Secondary | ICD-10-CM

## 2020-07-01 DIAGNOSIS — R972 Elevated prostate specific antigen [PSA]: Secondary | ICD-10-CM | POA: Diagnosis not present

## 2020-07-01 DIAGNOSIS — N138 Other obstructive and reflux uropathy: Secondary | ICD-10-CM | POA: Diagnosis not present

## 2020-07-01 DIAGNOSIS — N200 Calculus of kidney: Secondary | ICD-10-CM

## 2020-07-01 DIAGNOSIS — R39198 Other difficulties with micturition: Secondary | ICD-10-CM | POA: Diagnosis not present

## 2020-07-01 DIAGNOSIS — I2510 Atherosclerotic heart disease of native coronary artery without angina pectoris: Secondary | ICD-10-CM

## 2020-07-01 DIAGNOSIS — N281 Cyst of kidney, acquired: Secondary | ICD-10-CM

## 2020-07-01 LAB — URINALYSIS, ROUTINE W REFLEX MICROSCOPIC
Bilirubin, UA: NEGATIVE
Glucose, UA: NEGATIVE
Ketones, UA: NEGATIVE
Leukocytes,UA: NEGATIVE
Nitrite, UA: NEGATIVE
Protein,UA: NEGATIVE
Specific Gravity, UA: 1.02 (ref 1.005–1.030)
Urobilinogen, Ur: 0.2 mg/dL (ref 0.2–1.0)
pH, UA: 5 (ref 5.0–7.5)

## 2020-07-01 LAB — MICROSCOPIC EXAMINATION
Bacteria, UA: NONE SEEN
Renal Epithel, UA: NONE SEEN /hpf
WBC, UA: NONE SEEN /hpf (ref 0–5)

## 2020-07-01 NOTE — Progress Notes (Signed)
Urological Symptom Review  Patient is experiencing the following symptoms: Frequent urination Get up at night to urinate Stream starts and stops Trouble starting stream Weak stream Erection problems (male only)   Review of Systems  Gastrointestinal (upper)  : Indigestion/heartburn  Gastrointestinal (lower) : Negative for lower GI symptoms  Constitutional : Fatigue  Skin: Negative for skin symptoms  Eyes: Negative for eye symptoms  Ear/Nose/Throat : Negative for Ear/Nose/Throat symptoms  Hematologic/Lymphatic: Easy bruising  Cardiovascular : Negative for cardiovascular symptoms  Respiratory : Shortness of breath  Endocrine: Negative for endocrine symptoms  Musculoskeletal: Back pain  Neurological: Negative for neurological symptoms  Psychologic: Negative for psychiatric symptoms

## 2020-07-04 ENCOUNTER — Other Ambulatory Visit: Payer: Self-pay | Admitting: Neurology

## 2020-07-04 DIAGNOSIS — E1142 Type 2 diabetes mellitus with diabetic polyneuropathy: Secondary | ICD-10-CM | POA: Diagnosis not present

## 2020-07-04 DIAGNOSIS — G894 Chronic pain syndrome: Secondary | ICD-10-CM | POA: Diagnosis not present

## 2020-07-04 DIAGNOSIS — M545 Low back pain: Secondary | ICD-10-CM | POA: Diagnosis not present

## 2020-07-04 DIAGNOSIS — Z79891 Long term (current) use of opiate analgesic: Secondary | ICD-10-CM | POA: Diagnosis not present

## 2020-07-04 DIAGNOSIS — M130 Polyarthritis, unspecified: Secondary | ICD-10-CM | POA: Diagnosis not present

## 2020-07-04 DIAGNOSIS — M5416 Radiculopathy, lumbar region: Secondary | ICD-10-CM

## 2020-07-06 ENCOUNTER — Telehealth: Payer: Self-pay | Admitting: *Deleted

## 2020-07-06 NOTE — Telephone Encounter (Signed)
Spoke with pt.  Scheduled for back injection on 8/24.  Taking last dose of warfarin 8/19.  INR appt moved to 8/23 for reading before injection.  Pt appreciative.

## 2020-07-07 ENCOUNTER — Other Ambulatory Visit: Payer: Self-pay

## 2020-07-07 ENCOUNTER — Ambulatory Visit (HOSPITAL_COMMUNITY)
Admission: RE | Admit: 2020-07-07 | Discharge: 2020-07-07 | Disposition: A | Discharge status: Home / Self Care | Payer: Medicare Other | Source: Ambulatory Visit | Attending: Internal Medicine | Admitting: Internal Medicine

## 2020-07-07 DIAGNOSIS — R0600 Dyspnea, unspecified: Secondary | ICD-10-CM | POA: Diagnosis not present

## 2020-07-07 DIAGNOSIS — I517 Cardiomegaly: Secondary | ICD-10-CM | POA: Diagnosis not present

## 2020-07-07 DIAGNOSIS — R0609 Other forms of dyspnea: Secondary | ICD-10-CM

## 2020-07-07 DIAGNOSIS — R0602 Shortness of breath: Secondary | ICD-10-CM | POA: Diagnosis not present

## 2020-07-11 ENCOUNTER — Other Ambulatory Visit: Payer: Self-pay

## 2020-07-11 ENCOUNTER — Ambulatory Visit (INDEPENDENT_AMBULATORY_CARE_PROVIDER_SITE_OTHER): Payer: Medicare Other | Admitting: *Deleted

## 2020-07-11 DIAGNOSIS — Z5181 Encounter for therapeutic drug level monitoring: Secondary | ICD-10-CM | POA: Diagnosis not present

## 2020-07-11 DIAGNOSIS — I4891 Unspecified atrial fibrillation: Secondary | ICD-10-CM

## 2020-07-11 LAB — POCT INR: INR: 1.3 — AB (ref 2.0–3.0)

## 2020-07-11 NOTE — Patient Instructions (Signed)
Continue to hold warfarin tonight for back injection tomorrow.  Restart warfarin tomorrow night taking 1 tablet daily except 1/2 tablet on  Wednesdays Recheck INR in 3 wks Call us with any new medications, changes or any bleeding concerns @ [phone].

## 2020-07-12 ENCOUNTER — Other Ambulatory Visit: Payer: Self-pay | Admitting: Neurology

## 2020-07-12 ENCOUNTER — Ambulatory Visit
Admission: RE | Admit: 2020-07-12 | Discharge: 2020-07-12 | Disposition: A | Discharge status: Home / Self Care | Payer: Medicare Other | Source: Ambulatory Visit | Attending: Neurology | Admitting: Neurology

## 2020-07-12 DIAGNOSIS — M5416 Radiculopathy, lumbar region: Secondary | ICD-10-CM

## 2020-07-12 DIAGNOSIS — M48061 Spinal stenosis, lumbar region without neurogenic claudication: Secondary | ICD-10-CM | POA: Diagnosis not present

## 2020-07-12 MED ORDER — IOPAMIDOL (ISOVUE-M 200) INJECTION 41%
1.0000 mL | Freq: Once | INTRAMUSCULAR | Status: AC
  Administered 2020-07-12: 1 mL via EPIDURAL

## 2020-07-12 MED ORDER — METHYLPREDNISOLONE ACETATE 40 MG/ML INJ SUSP (RADIOLOG
120.0000 mg | Freq: Once | INTRAMUSCULAR | Status: AC
  Administered 2020-07-12: 120 mg via EPIDURAL

## 2020-07-12 NOTE — Discharge Instructions (Signed)

## 2020-07-19 DIAGNOSIS — K2270 Barrett's esophagus without dysplasia: Secondary | ICD-10-CM | POA: Diagnosis not present

## 2020-07-19 DIAGNOSIS — E7849 Other hyperlipidemia: Secondary | ICD-10-CM | POA: Diagnosis not present

## 2020-07-19 DIAGNOSIS — I480 Paroxysmal atrial fibrillation: Secondary | ICD-10-CM | POA: Diagnosis not present

## 2020-07-19 DIAGNOSIS — E119 Type 2 diabetes mellitus without complications: Secondary | ICD-10-CM | POA: Diagnosis not present

## 2020-07-28 ENCOUNTER — Other Ambulatory Visit: Payer: Self-pay

## 2020-07-28 ENCOUNTER — Ambulatory Visit (HOSPITAL_COMMUNITY)
Admission: RE | Admit: 2020-07-28 | Discharge: 2020-07-28 | Disposition: A | Discharge status: Home / Self Care | Payer: Medicare Other | Source: Ambulatory Visit | Attending: Urology | Admitting: Urology

## 2020-07-28 DIAGNOSIS — Q631 Lobulated, fused and horseshoe kidney: Secondary | ICD-10-CM | POA: Diagnosis not present

## 2020-07-28 DIAGNOSIS — N200 Calculus of kidney: Secondary | ICD-10-CM | POA: Diagnosis not present

## 2020-07-28 DIAGNOSIS — Q6102 Congenital multiple renal cysts: Secondary | ICD-10-CM | POA: Diagnosis not present

## 2020-07-28 DIAGNOSIS — I700 Atherosclerosis of aorta: Secondary | ICD-10-CM | POA: Diagnosis not present

## 2020-07-28 DIAGNOSIS — N201 Calculus of ureter: Secondary | ICD-10-CM | POA: Diagnosis not present

## 2020-07-29 ENCOUNTER — Encounter: Payer: Self-pay | Admitting: Urology

## 2020-07-29 DIAGNOSIS — I700 Atherosclerosis of aorta: Secondary | ICD-10-CM

## 2020-07-29 HISTORY — DX: Atherosclerosis of aorta: I70.0

## 2020-07-29 NOTE — Progress Notes (Signed)
He has a 22mm stone at the right distal ureter that is the probable cause for his symptoms.   There is minimal obstruction, but he will need to have this treated and I called him and will get him set up for ureteroscopy.  Risks and alternatives reviewed in detail.  We will get cardiology clearance.

## 2020-07-29 NOTE — H&P (View-Only) (Signed)
He has a 17mm stone at the right distal ureter that is the probable cause for his symptoms.   There is minimal obstruction, but he will need to have this treated and I called him and will get him set up for ureteroscopy.  Risks and alternatives reviewed in detail.  We will get cardiology clearance.

## 2020-07-29 NOTE — Progress Notes (Signed)
Sent to [employer] at [facility] for scheduling.

## 2020-08-01 ENCOUNTER — Telehealth: Payer: Self-pay

## 2020-08-01 ENCOUNTER — Telehealth: Payer: Self-pay | Admitting: *Deleted

## 2020-08-01 ENCOUNTER — Other Ambulatory Visit (HOSPITAL_COMMUNITY)
Admission: RE | Admit: 2020-08-01 | Discharge: 2020-08-01 | Disposition: A | Discharge status: Home / Self Care | Payer: Medicare Other | Source: Ambulatory Visit | Attending: Internal Medicine | Admitting: Internal Medicine

## 2020-08-01 ENCOUNTER — Other Ambulatory Visit: Payer: Self-pay

## 2020-08-01 DIAGNOSIS — Z01812 Encounter for preprocedural laboratory examination: Secondary | ICD-10-CM | POA: Insufficient documentation

## 2020-08-01 DIAGNOSIS — Z20822 Contact with and (suspected) exposure to covid-19: Secondary | ICD-10-CM | POA: Insufficient documentation

## 2020-08-01 LAB — SARS CORONAVIRUS 2 (TAT 6-24 HRS): SARS Coronavirus 2: NEGATIVE

## 2020-08-01 NOTE — Telephone Encounter (Signed)
Pt came by the office today to inquire about a surgery date that he and Dr. Jeffie Pollock discussed over the phone.   Surgical posting sheet was faxed to [facility] on 07/29/2020 to [facility], Dr. Jeffie Pollock OR scheduler. Pt voiced understanding.   Pam will contact pt about the surgery date at [facility].  Pt stated he stopped his warfarin on his on this past weekend hoping surgery would be this week. I instructed pt he needed to reach out to his cardiologist that he stopped his medication on his own to inform them- we have not obtained any clearance on stopping this medication and to stay on all medication until directed otherwise. Pt voiced understanding.

## 2020-08-01 NOTE — Telephone Encounter (Signed)
Pt is pending Kidney stone surgery (thinks it may be scheduled this week) and stopped coumadin 9/10 Friday and wanted to discuss with coumadin nurse

## 2020-08-01 NOTE — Telephone Encounter (Signed)
Spoke to patient, he said he stopped his warfarin on Friday because he knew he need to have a procedure for kidney stone and was hoping they would do it the first of this week. States they cant do procedure the beginning of the week and now he doesn't know what to do. I advised that he take 1.5 tablets today then resume normal dose. Already has apt with lisa on Wed.

## 2020-08-02 ENCOUNTER — Ambulatory Visit (INDEPENDENT_AMBULATORY_CARE_PROVIDER_SITE_OTHER): Payer: Medicare Other | Admitting: *Deleted

## 2020-08-02 ENCOUNTER — Other Ambulatory Visit: Payer: Self-pay | Admitting: Urology

## 2020-08-02 DIAGNOSIS — Z5181 Encounter for therapeutic drug level monitoring: Secondary | ICD-10-CM | POA: Diagnosis not present

## 2020-08-02 DIAGNOSIS — I4891 Unspecified atrial fibrillation: Secondary | ICD-10-CM | POA: Diagnosis not present

## 2020-08-02 LAB — POCT INR: INR: 1.1 — AB (ref 2.0–3.0)

## 2020-08-02 NOTE — Patient Instructions (Signed)
Take warfarin 2 tablets tonight, 1 1/2 tablets tomorrow night then resume 1 tablet daily except 1/2 tablet on Wednesdays Recheck INR in 1 wk Pending cystoscopy on 9/30.  Awaiting surgical clearance Call us with any new medications, changes or any bleeding concerns @ [phone].

## 2020-08-03 ENCOUNTER — Telehealth: Payer: Self-pay | Admitting: Family Medicine

## 2020-08-03 ENCOUNTER — Encounter (HOSPITAL_COMMUNITY): Payer: Medicare Other

## 2020-08-03 NOTE — Telephone Encounter (Signed)
   [facility] Pre-operative Risk Assessment    HEARTCARE STAFF: - Please ensure there is not already an duplicate clearance open for this procedure. - Under Visit Info/Reason for Call, type in Other and utilize the format Clearance MM/DD/YY or Clearance TBD. Do not use dashes or single digits. - If request is for dental extraction, please clarify the # of teeth to be extracted.  Request for surgical clearance:  1. What type of surgery is being performed? Cystoscopy Right Ureteroscopy and stent placement   2. When is this surgery scheduled? 08-18-2020  3. What type of clearance is required (medical clearance vs. Pharmacy clearance to hold med vs. Both)? BOTH  4. Are there any medications that need to be held prior to surgery and how long? DISCONTINUATION TIME FOR WARFARIN  -REQUESTING AN AVERAGE OF 5 DAYS    5. Practice name and name of physician performing surgery? Dr. Jeffie Pollock with [facility]    6. What is the office phone number? [phone]   7.   What is the office fax number? [phone]  Attention: Pam   8.   Anesthesia type (None, local, MAC, general) none noted    [name] 08/03/2020, 8:45 AM  _________________________________________________________________   (provider comments below)

## 2020-08-03 NOTE — Telephone Encounter (Signed)
Patient with diagnosis of afib on warfarin for anticoagulation.    Procedure: Cystoscopy Right Ureteroscopy and stent placement  Date of procedure: 08/18/20  CHADS2-VASc score of 4 (age x2, HTN, CAD)  CrCl 35mL/min Platelet count 192K  Per office protocol, patient can hold warfarin for 5 days prior to procedure.

## 2020-08-03 NOTE — Telephone Encounter (Signed)
° °  Primary Cardiologist: Kate Sable, MD (Inactive)  Chart reviewed as part of pre-operative protocol coverage. Patient was contacted 08/03/2020 in reference to pre-operative risk assessment for pending surgery as outlined below.  [name] was last seen on 05/09/20 by Katina Dung, NP.  Since that day, [name] has done fine from a cardiac standpoint. He continues to have DOE but this is unchanged since his last visit. He is now following outpatient with pulmonology for his SOB. He can complete 4 METs without anginal complaints.   Therefore, based on ACC/AHA guidelines, the patient would be at acceptable risk for the planned procedure without further cardiovascular testing.   The patient was advised that if he develops new symptoms prior to surgery to contact our office to arrange for a follow-up visit, and he verbalized understanding.  Per pharmacy recommendations, patient can hold coumadin 5 days prior to his upcoming urologic procedure with plans to restart when cleared to do so by urology.  I will route this recommendation to the requesting party via Epic fax function and remove from pre-op pool. Please call with questions.  Abigail Butts, PA-C 08/03/2020, 4:49 PM

## 2020-08-11 ENCOUNTER — Telehealth: Payer: Self-pay | Admitting: *Deleted

## 2020-08-11 NOTE — Telephone Encounter (Signed)
Spoke with pt.  He is scheduled for cystoscopy on 9/30.  Told him to take his last dose of warfarin tomorrow night (9/24) and resume after procedure when cleared by Dr Jeffie Pollock.  INR appt made for 10/7 @ 2:30pm.  Pt verbalized understanding and appreciated call.

## 2020-08-11 NOTE — Telephone Encounter (Signed)
Patient called stating he's unable to make his apt today and would like to when he can come in before his procedure

## 2020-08-12 ENCOUNTER — Encounter (HOSPITAL_COMMUNITY): Payer: Self-pay

## 2020-08-12 ENCOUNTER — Other Ambulatory Visit: Payer: Self-pay

## 2020-08-12 ENCOUNTER — Encounter (HOSPITAL_COMMUNITY)
Admission: RE | Admit: 2020-08-12 | Discharge: 2020-08-12 | Disposition: A | Discharge status: Home / Self Care | Payer: Medicare Other | Source: Ambulatory Visit | Attending: Urology | Admitting: Urology

## 2020-08-12 DIAGNOSIS — N400 Enlarged prostate without lower urinary tract symptoms: Secondary | ICD-10-CM | POA: Diagnosis not present

## 2020-08-12 DIAGNOSIS — Z7901 Long term (current) use of anticoagulants: Secondary | ICD-10-CM | POA: Insufficient documentation

## 2020-08-12 DIAGNOSIS — E109 Type 1 diabetes mellitus without complications: Secondary | ICD-10-CM | POA: Diagnosis not present

## 2020-08-12 DIAGNOSIS — K219 Gastro-esophageal reflux disease without esophagitis: Secondary | ICD-10-CM | POA: Diagnosis not present

## 2020-08-12 DIAGNOSIS — I480 Paroxysmal atrial fibrillation: Secondary | ICD-10-CM | POA: Insufficient documentation

## 2020-08-12 DIAGNOSIS — I10 Essential (primary) hypertension: Secondary | ICD-10-CM | POA: Diagnosis not present

## 2020-08-12 DIAGNOSIS — I2510 Atherosclerotic heart disease of native coronary artery without angina pectoris: Secondary | ICD-10-CM | POA: Insufficient documentation

## 2020-08-12 DIAGNOSIS — Z01812 Encounter for preprocedural laboratory examination: Secondary | ICD-10-CM | POA: Insufficient documentation

## 2020-08-12 DIAGNOSIS — N201 Calculus of ureter: Secondary | ICD-10-CM | POA: Diagnosis not present

## 2020-08-12 HISTORY — DX: Personal history of urinary calculi: Z87.442

## 2020-08-12 LAB — BASIC METABOLIC PANEL
Anion gap: 8 (ref 5–15)
BUN: 13 mg/dL (ref 8–23)
CO2: 28 mmol/L (ref 22–32)
Calcium: 9.3 mg/dL (ref 8.9–10.3)
Chloride: 105 mmol/L (ref 98–111)
Creatinine, Ser: 0.95 mg/dL (ref 0.61–1.24)
GFR calc Af Amer: >60 mL/min (ref >60)
GFR calc non Af Amer: >60 mL/min (ref >60)
Glucose, Bld: 69 mg/dL — ABNORMAL LOW (ref 70–99)
Potassium: 3.9 mmol/L (ref 3.5–5.1)
Sodium: 141 mmol/L (ref 135–145)

## 2020-08-12 LAB — CBC
HCT: 40.9 % (ref 39.0–52.0)
Hemoglobin: 14.1 g/dL (ref 13.0–17.0)
MCH: 32.8 pg (ref 26.0–34.0)
MCHC: 34.5 g/dL (ref 30.0–36.0)
MCV: 95.1 fL (ref 80.0–100.0)
Platelets: 143 10*3/uL — ABNORMAL LOW (ref 150–400)
RBC: 4.3 MIL/uL (ref 4.22–5.81)
RDW: 13.6 % (ref 11.5–15.5)
WBC: 5.5 10*3/uL (ref 4.0–10.5)
nRBC: 0 % (ref 0.0–0.2)

## 2020-08-12 LAB — HEMOGLOBIN A1C
Hgb A1c MFr Bld: 7.1 % — ABNORMAL HIGH (ref 4.8–5.6)
Mean Plasma Glucose: 157.07 mg/dL

## 2020-08-12 NOTE — Progress Notes (Addendum)
Glucose 69 on preop labs of 08/12/20.  Called patient at hom eand he ate upon leaving preop department.  Patent ate at [employer] and drink.  Patient voices no complaints.  Patient reports he has been having some lightheaded spells recently.  Patient reports he does not eat a lot of protein.  Encouraged increase in protein intake for diabetes.  Patient does report he eats a snack lightly.   BMP result of 9.24.21 routed via epic ot DR [facility].

## 2020-08-12 NOTE — Progress Notes (Addendum)
Anesthesia Review:  PCP: DR Redmond School  Cardiologist : 08/03/20- Cardiac clearance - epic Levell July.NP  Chest x-ray :07/07/2020  06/28/20- PFT  DR Melvyn Novas- pulmonary - LOV 06/28/20  EKG :04/07/20  Echo : Stress test: Cardiac Cath :  Activity level: pt gets sob when doing a fight of stairs  Sleep Study/ CPAP :no  Fasting Blood Sugar :      / Checks Blood Sugar -- times a day:   Blood Thinner/ Instructions /Last Dose: ASA / Instructions/ Last Dose :  DM- type 1? Per pt HGBA1C- 7.1 on 9/24/21l preop labs Coumadin for atril fib Last Dose- 08/12/20 per pt  Patient states he has had cough  for a couple of month per pt at preop visit.  Denies any other symptoms of Covid.  Patient reports both vaccines.  Temp- 99.1 at preop.  See Progress note related to glucose of 69 om BMP of 08/12/20.  Made Roanna Banning aware of above .  No new orders given. BMP result of 08/12/20 routed via epic to DR [facility] [place].

## 2020-08-12 NOTE — Progress Notes (Signed)
DUE TO COVID-19 ONLY ONE VISITOR IS ALLOWED TO COME WITH YOU AND STAY IN THE WAITING ROOM ONLY DURING PRE OP AND PROCEDURE DAY OF SURGERY. THE 1 VISITOR  MAY VISIT WITH YOU AFTER SURGERY IN YOUR PRIVATE ROOM DURING VISITING HOURS ONLY!  YOU NEED TO HAVE A COVID 19 TEST ON___9/27/2021 ____ @_______ , THIS TEST MUST BE DONE BEFORE SURGERY,  COVID TESTING SITE [address] [place] [address], IT IS ON THE RIGHT GOING OUT [address] APPROXIMATELY  2 MINUTES PAST [employer] ON THE RIGHT. ONCE YOUR COVID TEST IS COMPLETED,  PLEASE BEGIN THE QUARANTINE INSTRUCTIONS AS OUTLINED IN YOUR HANDOUT.                [name]  08/12/2020   Your procedure is scheduled on: 08/18/2020    Report to [facility] Main  Entrance   Report to admitting at    1030 AM     Call this number if you have problems the morning of surgery [phone]    Remember: Do not eat food , candy gum or mints :After Midnight. You may have clear liquids from midnight until 0930am    CLEAR LIQUID DIET   Foods Allowed                                                                       Coffee and tea, regular and decaf                              Plain Jell-O any favor except red or purple                                            Fruit ices (not with fruit pulp)                                      Iced Popsicles                                     Carbonated beverages, regular and diet                                    Cranberry, grape and apple juices Sports drinks like Gatorade Lightly seasoned clear broth or consume(fat free) Sugar, honey syrup   _____________________________________________________________________    BRUSH YOUR TEETH MORNING OF SURGERY AND RINSE YOUR MOUTH OUT, NO CHEWING GUM CANDY OR MINTS.     Take these medicines the morning of surgery with A SIP OF WATER:   DO NOT TAKE ANY DIABETIC MEDICATIONS DAY OF YOUR SURGERY                               You  may not have any metal on your body including  hair pins and              piercings  Do not wear jewelry, make-up, lotions, powders or perfumes, deodorant             Do not wear nail polish on your fingernails.  Do not shave  48 hours prior to surgery.              Men may shave face and neck.   Do not bring valuables to the hospital. [place].  Contacts, dentures or bridgework may not be worn into surgery.  Leave suitcase in the car. After surgery it may be brought to your room.     Patients discharged the day of surgery will not be allowed to drive home. IF YOU ARE HAVING SURGERY AND GOING HOME THE SAME DAY, YOU MUST HAVE AN ADULT TO DRIVE YOU HOME AND BE WITH YOU FOR 24 HOURS. YOU MAY GO HOME BY TAXI OR UBER OR ORTHERWISE, BUT AN ADULT MUST ACCOMPANY YOU HOME AND STAY WITH YOU FOR 24 HOURS.  Name and phone number of your driver:  Special Instructions: N/A              Please read over the following fact sheets you were given: _____________________________________________________________________  [facility] - Preparing for Surgery Before surgery, you can play an important role.  Because skin is not sterile, your skin needs to be as free of germs as possible.  You can reduce the number of germs on your skin by washing with CHG (chlorahexidine gluconate) soap before surgery.  CHG is an antiseptic cleaner which kills germs and bonds with the skin to continue killing germs even after washing. Please DO NOT use if you have an allergy to CHG or antibacterial soaps.  If your skin becomes reddened/irritated stop using the CHG and inform your nurse when you arrive at Short Stay. Do not shave (including legs and underarms) for at least 48 hours prior to the first CHG shower.  You may shave your face/neck. Please follow these instructions carefully:  1.  Shower with CHG Soap the night before surgery and the  morning of Surgery.  2.  If you choose to  wash your hair, wash your hair first as usual with your  normal  shampoo.  3.  After you shampoo, rinse your hair and body thoroughly to remove the  shampoo.                           4.  Use CHG as you would any other liquid soap.  You can apply chg directly  to the skin and wash                       Gently with a scrungie or clean washcloth.  5.  Apply the CHG Soap to your body ONLY FROM THE NECK DOWN.   Do not use on face/ open                           Wound or open sores. Avoid contact with eyes, ears mouth and genitals (private parts).                       Wash face,  Genitals (private parts) with  your normal soap.             6.  Wash thoroughly, paying special attention to the area where your surgery  will be performed.  7.  Thoroughly rinse your body with warm water from the neck down.  8.  DO NOT shower/wash with your normal soap after using and rinsing off  the CHG Soap.                9.  Pat yourself dry with a clean towel.            10.  Wear clean pajamas.            11.  Place clean sheets on your bed the night of your first shower and do not  sleep with pets. Day of Surgery : Do not apply any lotions/deodorants the morning of surgery.  Please wear clean clothes to the hospital/surgery center.  FAILURE TO FOLLOW THESE INSTRUCTIONS MAY RESULT IN THE CANCELLATION OF YOUR SURGERY PATIENT SIGNATURE_________________________________  NURSE SIGNATURE__________________________________  ________________________________________________________________________

## 2020-08-14 NOTE — Progress Notes (Signed)
Cardiology Office Note  Date: 08/15/2020   ID: [name], DOB 09/03/1941, MRN [id number]  PCP:  Redmond School, MD  Cardiologist:  No primary care provider on file. Electrophysiologist:  None   Chief Complaint: Follow-up paroxysmal atrial fib, CAD, hypertension  History of Present Illness: [name] is a 79 y.o. male with a history of  paroxysmal atrial fib.,  CAD, hypertension.  Cardiac catheterization 07/21/2019 showed patent LM and left circumflex with 40 to 60% proximal LAD and 30 to 40% proximal RCA stenosis with normal LV function.  Aggressive risk factor modification was recommended along with possible sleep study given his snoring.  Last encounter via telemedicine with Dr. Bronson Ing.  Patient stated heart raced when he went up a flight of stairs and has to sit down and rest to recover his breathing.  He stated he had episodes of feeling like he was going to pass out when he sitting down.  Had not checked his blood pressure or blood sugar during his episodes but had episodes of hypoglycemia on other occasions.  Had occasional palpitations and has developed bradycardia in the past with low-dose Toprol and  currently not on beta-blockers.  He was asked to monitor his blood sugar and blood pressure during the episodes when he feels dizzy while sitting down.  Recently presented to PCP office for complaints of fatigue.  CBC was unremarkable.  Glucose was 142, triglycerides 189, HDL 34, LDL 118, TSH was 3.1, Vitamin D 22.1, PSA was high at 8.7.  At last visit he was having increasing shortness of breath.  Stated he was having less and less energy.  He was taking care of his chronically ill wife.  At his last cardiac catheterization it was suggested that he have a sleep study.  Patient did not want to look into a sleep study.  He was certain he could not wear the device if he was positive for sleep apnea.  He stated when he had to exert the least little bit of exertional activity  such as helping his wife or bending over to pick up something he gets a little dizzy but no near syncopal or syncopal episodes.  His EKG showed atrial flutter with variable AV block rate of 75.  His last echo was June 2019.  He denied any anginal symptoms.  Denied any PND or orthopnea.  No evidence of lower extremity edema.  At last visit he continued to complain of exertional fatigue and fatigue in general.  Stated he just felt like he did not have any energy.  Stated he still had shortness of breath when performing more than ADLs such as ascending steps at home.  Stated he was golfing the weekend and had some exertional dyspnea.  Stated his primary care provider recently started him on vitamin D pills for vitamin D deficiency.  He has a 90-pack-year history of smoking although he quit quite a few years ago.  He denies any anginal symptoms.  Had occasional dizziness but no presyncopal or syncopal episodes.  No CVA or TIA-like symptoms, PND, orthopnea, lower extremity edema.  He continues to complain of fatigue and shortness of breath.  Has a significant history of smoking.  He is definitely under a lot of stress at home taking care of his sick wife with significant Parkinson's disease.  He is the primary caregiver.  States he has to get up several times per night to help her as well as go to the bathroom to urinate secondary  to his history of prostate issues.  He denies any history of obstructive sleep apnea.  States he has been recently having issues with fluctuating blood sugars.  He has pending kidney stone removal surgery and had a recent preop screening his blood blood sugar was elevated at 286.  He states sometimes it can range from 180s to 280s.  Also has been as low as the mid 60s.  States he is only taking glipizide 5 mg daily and is concerned his blood sugars are fluctuating.  He denies any recent significant dizziness or presyncopal episodes.  Still has issues with shortness of breath.  He has  pending PFTs per pulmonology.  Blood pressure is 90/60 today but he denies any lightheadedness, dizziness, or presyncopal/syncopal episode.  Currently on no medications which may affect his blood pressure other than tamsulosin for his prostate.  He does take gabapentin for neuropathic pain and occasional Xanax for sleep as needed.   Past Medical History:  Diagnosis Date  . Aortic atherosclerosis ([place]) 07/29/2020   Incidental CT finding.   . Barrett's esophagus    short-segment, diagnosed in 2007 by Dr. [name]  . BPH (benign prostatic hyperplasia)   . CAD (coronary artery disease)    a. cath on 07/21/2019 showing patent LM and LCx with 40-60% Proximal LAD and 30-40% Pro-RCA stenosis with normal LV function.  . Diabetes mellitus without complication (HCC)    type 1   . Dyspnea    with exertion   . GERD (gastroesophageal reflux disease)   . Hiatal hernia    small  . History of kidney stones   . HTN (hypertension)   . PAF (paroxysmal atrial fibrillation) ([place])   . S/P colonoscopy 2007   unremarkable  . S/P endoscopy Oct 2011    Salmon-colored epithelium coming up to 37 cm from the   . Vertigo     Past Surgical History:  Procedure Laterality Date  . APPENDECTOMY    . BIOPSY  01/28/2020   Procedure: BIOPSY;  Surgeon: [name], MD;  [facility];  Service: Endoscopy;;  esophagus  . CHOLECYSTECTOMY    . COLONOSCOPY  02/06/2006   Dr. [name]- single anal papilla, o/w normal rectum, normal colon  . COLONOSCOPY N/A 09/13/2016   RMR: three 5-6 mm for adenomas removed.  Recommend one last surveillance colonoscopy in October 2020.  [name] COLONOSCOPY WITH PROPOFOL N/A 01/28/2020   Procedure: COLONOSCOPY WITH PROPOFOL;  Surgeon: [name], MD;  [facility];  Service: Endoscopy;  Laterality: N/A;  10:45am  . ESOPHAGOGASTRODUODENOSCOPY  09/15/2010   Dr. [name]- Salmon-colored epithelium coming up to 37 cm from the   . ESOPHAGOGASTRODUODENOSCOPY N/A 07/02/2013   Dr.  [name]- Barretts on bx. hiatal hernia  . ESOPHAGOGASTRODUODENOSCOPY N/A 09/13/2016   RMR: Barrett's esophagus without dysplasia. one last EGD in 3 years.   . ESOPHAGOGASTRODUODENOSCOPY (EGD) WITH PROPOFOL N/A 01/28/2020   Procedure: ESOPHAGOGASTRODUODENOSCOPY (EGD) WITH PROPOFOL;  Surgeon: [name], MD;  [facility];  Service: Endoscopy;  Laterality: N/A;  . INTRAVASCULAR PRESSURE WIRE/FFR STUDY N/A 07/21/2019   Procedure: INTRAVASCULAR PRESSURE WIRE/FFR STUDY;  Surgeon: [name], MD;  Location: [facility];  Service: Cardiovascular;  Laterality: N/A;  . LEFT HEART CATH AND CORONARY ANGIOGRAPHY N/A 07/21/2019   Procedure: LEFT HEART CATH AND CORONARY ANGIOGRAPHY;  Surgeon: [name], MD;  Location: [facility];  Service: Cardiovascular;  Laterality: N/A;  . POLYPECTOMY  01/28/2020   Procedure:  POLYPECTOMY;  Surgeon: [name], MD;  [facility];  Service: Endoscopy;;  colon  . ROTATOR CUFF REPAIR    . YAG LASER APPLICATION Right 02/20/346   Procedure: YAG LASER APPLICATION;  Surgeon: [name], MD;  Location: [facility];  Service: Ophthalmology;  Laterality: Right;    Current Outpatient Medications  Medication Sig Dispense Refill  . acetaminophen (TYLENOL) 500 MG tablet Take 1,000 mg by mouth every 6 (six) hours as needed for moderate pain.     [name] allopurinol (ZYLOPRIM) 100 MG tablet Take 100 mg by mouth daily.     [name] ALPRAZolam (XANAX) 1 MG tablet Take 0.5 mg by mouth at bedtime as needed for anxiety or sleep.     . Cholecalciferol (VITAMIN D3) 50 MCG (2000 UT) TABS Take 2,000 Units by mouth daily.    . fexofenadine (ALLEGRA) 180 MG tablet Take 180 mg by mouth daily.    [name] gabapentin (NEURONTIN) 300 MG capsule Take 300 mg by mouth 2 (two) times daily.     [name] glipiZIDE (GLUCOTROL) 5 MG tablet Take 5 mg by mouth daily before breakfast.   4  . pantoprazole (PROTONIX) 40 MG tablet Take 1 tablet (40 mg total) by mouth 2 (two) times daily before  a meal. 180 tablet 3  . tamsulosin (FLOMAX) 0.4 MG CAPS capsule Take 1 capsule (0.4 mg total) by mouth 2 (two) times daily. 30 capsule 2  . traMADol (ULTRAM) 50 MG tablet Take 50 mg by mouth 3 (three) times daily as needed (pain.).     [name] warfarin (COUMADIN) 5 MG tablet TAKE ONE TABLET BY MOUTH DAILY EXCEPT ONE-HALF TABLET ON WEDNESDAYS OR AS DIRECTED (Patient taking differently: Take 2.5-5 mg by mouth See admin instructions. Take 1 tablet (5 mg) by mouth daily, except take 0.5 tablet (2.5 mg) by mouth on Wednesdays or as directed) 90 tablet 2   No current facility-administered medications for this visit.   Allergies:  Patient has no known allergies.   Social History: The patient  reports that he quit smoking about 26 years ago. His smoking use included cigarettes. He has a 45.00 pack-year smoking history. He has never used smokeless tobacco. He reports that he does not drink alcohol and does not use drugs.   Family History: The patient's family history includes Prostate cancer in his father; Stroke in his mother.   ROS:  Please see the history of present illness. Otherwise, complete review of systems is positive for none.  All other systems are reviewed and negative.   Physical Exam: VS:  BP 90/60   Pulse 68   Ht 5\' 11"  (1.803 m)   Wt 211 lb 9.6 oz (96 kg)   SpO2 97%   BMI 29.51 kg/m , BMI Body mass index is 29.51 kg/m.  Wt Readings from Last 3 Encounters:  08/15/20 211 lb 9.6 oz (96 kg)  08/12/20 210 lb (95.3 kg)  07/01/20 214 lb (97.1 kg)    General: Patient appears comfortable at rest. Neck: Supple, no elevated JVP or carotid bruits, no thyromegaly. Lungs: Clear to auscultation, nonlabored breathing at rest. Cardiac: Irregularly irregular rate and rhythm, no S3 or significant systolic murmur, no pericardial rub. Extremities: No pitting edema, distal pulses 2+. Skin: Warm and dry. Musculoskeletal: No kyphosis. Neuropsychiatric: Alert and oriented x3, affect grossly  appropriate.  ECG:  An ECG dated 04/07/2020 was personally reviewed today and demonstrated:  Atrial flutter with variable AV block, RSR prime or QR pattern in V1 suggests  right ventricular conduction delay rate of 75  Recent Labwork: 08/12/2020: BUN 13; Creatinine, Ser 0.95; Hemoglobin 14.1; Platelets 143; Potassium 3.9; Sodium 141  No results found for: CHOL, TRIG, HDL, CHOLHDL, VLDL, LDLCALC, LDLDIRECT  Other Studies Reviewed Today:  Echocardiogram  04/19/2020  1. Left ventricular ejection fraction, by estimation, is 65 to 70%. The left ventricle has normal function. The left ventricle has no regional wall motion abnormalities. Left ventricular diastolic parameters were normal. 2. Right ventricular systolic function is normal. The right ventricular size is normal. There is normal pulmonary artery systolic pressure. The estimated right ventricular systolic pressure is 12.7 mmHg. 3. The mitral valve is grossly normal. Trivial mitral valve regurgitation. 4. The aortic valve is tricuspid. Aortic valve regurgitation is trivial. 5. Aortic dilatation noted. There is mild dilatation of the aortic root. 6. The inferior vena cava is normal in size with greater than 50% respiratory variability, suggesting right atrial pressure of 3 mmHg.   Carotid artery duplex 08/28/2019  RIGHT CAROTID ARTERY: There is no significant stenosis. There is no significant atherosclerotic disease.  RIGHT VERTEBRAL ARTERY:  Antegrade flow is noted.  LEFT CAROTID ARTERY: There is mild soft plaque within the mid left CCA without evidence for a significant stenosis. There is some mild mixed calcified and noncalcified plaque in the carotid bulb without evidence for significant stenosis.  LEFT VERTEBRAL ARTERY:  Antegrade flow is noted  IMPRESSION: Less than 50% stenosis of the bilateral ICAs.   Cardiac Catheterization 07/21/2019   Widely patent left main  40 to 60% proximal LAD with mild calcification.   DFR on LAD stenosis 0.94.  Widely patent circumflex  Very tortuous proximal right coronary with 30 to 40% proximal narrowing.  No significant obstruction identified.  Normal LV function.  RECOMMENDATIONS:   Given the location of the LAD stenosis, he needs aggressive preventive therapy which could include high intensity statin therapy, VASCEPA9 (if TG > 140), and SGLT2.  The patient does snore and perhaps some of his complaints are related to sleep deprivation from obstructive sleep apnea.  Consider sleep study.  Resume anticoagulation therapy this evening.  Diagnostic Dominance: Right    Assessment and Plan:    1. Atrial flutter, unspecified type (HCC)  Continue Coumadin 5 mg by mouth daily except 1/2 tablet on Tuesdays and Fridays.    Complaining of significant fatigue and increased activity intolerance.  Heart rate today 68 and irregularly irregular  3. CAD in native artery Cardiac catheterization 07/30/2019 showed widely patent left main, 40 to 60% proximal LAD with mild calcification.  DFR on LAD stenosis 0.94, widely patent circumflex, very tortuous proximal right coronary artery with 30 to 40% proximal narrowing.  No significant obstruction identified, normal LV function.  Patient denies any typical anginal symptoms.  Continue atorvastatin 40 mg daily.  Not on aspirin due to his systemic anticoagulation on Coumadin.  4. Pre-syncope At last visit patient denied any feelings of faintness but does complain of dizziness when bending over and attempting to stand erect.  He has never fallen or passed out per his statement.  Has a significant history of vertigo per his statement.  Stated he had not had any significant vertigo recently.  Patient denies any recent syncopal or near syncopal episodes.  Patient is taking care of his wife who has Parkinson's disease and is nearly dependent on him for everything.  States he is sleep deprived and has to get up several times per night with  her and several times to urinate due  to his prostate problems.  5. SOB (shortness of breath) Continues to complain of increasing shortness of breath.  Has a 90-pack-year of smoking 2 packs/day x 45 years.  States he quit quite a few years ago.  Recently saw pulmonology.  He has a pending pulmonary function test scheduled by pulmonology.  6.  Diabetes. Patient states he has been having some significant fluctuations in his blood sugar.  He is currently on glipizide which seems to sometimes causes blood sugars to decrease into the 60s.  States he is recently had some blood sugars ranging from 180s to 280s.  States it seems not to be well controlled on just the one medication.  Please refer to Dr. Dorris Fetch endocrinology.  Medication Adjustments/Labs and Tests Ordered: Current medicines are reviewed at length with the patient today.  Concerns regarding medicines are outlined above.   Disposition: Follow-up with Dr. Domenic Polite or APP 6 months Signed, Levell July, NP 08/15/2020 1:40 PM    [facility], [place],  [address] Phone: [phone]; Fax: [phone]

## 2020-08-15 ENCOUNTER — Ambulatory Visit (INDEPENDENT_AMBULATORY_CARE_PROVIDER_SITE_OTHER): Payer: Medicare Other | Admitting: Family Medicine

## 2020-08-15 ENCOUNTER — Other Ambulatory Visit: Payer: Self-pay

## 2020-08-15 ENCOUNTER — Other Ambulatory Visit (HOSPITAL_COMMUNITY): Payer: Medicare Other

## 2020-08-15 ENCOUNTER — Other Ambulatory Visit (HOSPITAL_COMMUNITY)
Admission: RE | Admit: 2020-08-15 | Discharge: 2020-08-15 | Disposition: A | Discharge status: Home / Self Care | Payer: Medicare Other | Source: Ambulatory Visit | Attending: Urology | Admitting: Urology

## 2020-08-15 ENCOUNTER — Encounter: Payer: Self-pay | Admitting: Family Medicine

## 2020-08-15 VITALS — BP 90/60 | HR 68 | Ht 71.0 in | Wt 211.6 lb

## 2020-08-15 DIAGNOSIS — R0600 Dyspnea, unspecified: Secondary | ICD-10-CM

## 2020-08-15 DIAGNOSIS — R42 Dizziness and giddiness: Secondary | ICD-10-CM

## 2020-08-15 DIAGNOSIS — Z01812 Encounter for preprocedural laboratory examination: Secondary | ICD-10-CM | POA: Insufficient documentation

## 2020-08-15 DIAGNOSIS — Z20822 Contact with and (suspected) exposure to covid-19: Secondary | ICD-10-CM | POA: Insufficient documentation

## 2020-08-15 DIAGNOSIS — R55 Syncope and collapse: Secondary | ICD-10-CM | POA: Diagnosis not present

## 2020-08-15 DIAGNOSIS — I2510 Atherosclerotic heart disease of native coronary artery without angina pectoris: Secondary | ICD-10-CM

## 2020-08-15 DIAGNOSIS — E1140 Type 2 diabetes mellitus with diabetic neuropathy, unspecified: Secondary | ICD-10-CM | POA: Diagnosis not present

## 2020-08-15 DIAGNOSIS — R0609 Other forms of dyspnea: Secondary | ICD-10-CM

## 2020-08-15 DIAGNOSIS — I4892 Unspecified atrial flutter: Secondary | ICD-10-CM

## 2020-08-15 LAB — SARS CORONAVIRUS 2 (TAT 6-24 HRS): SARS Coronavirus 2: NEGATIVE

## 2020-08-15 NOTE — Progress Notes (Signed)
Anesthesia Chart Review   Case: [id number] Date/Time: 08/18/20 1215   Procedure: CYSTOSCOPY WITH RIGHT RETROGRADE PYELOGRAM, URETEROSCOPY WITH HOLMIUM LASER AND STENT PLACEMENT (Right )   Anesthesia type: General   Pre-op diagnosis: RIGHT DISTAL URETERAL  STONE   Location: [facility]   Surgeons: [name], MD      DISCUSSION:79 y.o. former smoker (45 pack years, quit 11/18/93) with h/o GERD, HTN, PAF (Coumadin), CAD, DM I, BPH, right distal ureteral stone scheduled for above procedure 08/18/2020 with Dr. [name].   Per cardiology risk assessment 08/03/2020, "Chart reviewed as part of pre-operative protocol coverage. Patient was contacted 08/03/2020 in reference to pre-operative risk assessment for pending surgery as outlined below.  [name] was last seen on 05/09/20 by Katina Dung, NP.  Since that day, [name] has done fine from a cardiac standpoint. He continues to have DOE but this is unchanged since his last visit. He is now following outpatient with pulmonology for his SOB. He can complete 4 METs without anginal complaints.  Therefore, based on ACC/AHA guidelines, the patient would be at acceptable risk for the planned procedure without further cardiovascular testing.  The patient was advised that if he develops new symptoms prior to surgery to contact our office to arrange for a follow-up visit, and he verbalized understanding. Per pharmacy recommendations, patient can hold coumadin 5 days prior to his upcoming urologic procedure with plans to restart when cleared to do so by urology."  Anticipate pt can proceed with planned procedure barring acute status change.   VS: BP 124/70   Pulse (!) 57   Temp 37.3 C (Oral)   Ht 5\' 11"  (1.803 m)   Wt 95.3 kg   SpO2 98%   BMI 29.29 kg/m   PROVIDERS: Redmond School, MD is PCP   Kate Sable, MD is Cardiologist   Christinia Gully, MD is Pulmonologist, last seen 06/28/2020 LABS: Labs reviewed: Acceptable for  surgery. (all labs ordered are listed, but only abnormal results are displayed)  Labs Reviewed  BASIC METABOLIC PANEL - Abnormal; Notable for the following components:      Result Value   Glucose, Bld 69 (*)    All other components within normal limits  CBC - Abnormal; Notable for the following components:   Platelets 143 (*)    All other components within normal limits  HEMOGLOBIN A1C - Abnormal; Notable for the following components:   Hgb A1c MFr Bld 7.1 (*)    All other components within normal limits     IMAGES:   EKG: 04/07/2020 Rate 75 bpm  Atrial flutter with variable AV block  RSR' or QR pattern in V1 suggests right ventricular conduction delay  CV: Echo 04/19/2020 IMPRESSIONS    1. Left ventricular ejection fraction, by estimation, is 65 to 70%. The  left ventricle has normal function. The left ventricle has no regional  wall motion abnormalities. Left ventricular diastolic parameters were  normal.  2. Right ventricular systolic function is normal. The right ventricular  size is normal. There is normal pulmonary artery systolic pressure. The  estimated right ventricular systolic pressure is 68.0 mmHg.  3. The mitral valve is grossly normal. Trivial mitral valve  regurgitation.  4. The aortic valve is tricuspid. Aortic valve regurgitation is trivial.  5. Aortic dilatation noted. There is mild dilatation of the aortic root.  6. The inferior vena cava is normal in size with greater than 50%  respiratory variability, suggesting right atrial pressure  of 3 mmHg.  Cardiac Cath 07/21/2019  Widely patent left main  40 to 60% proximal LAD with mild calcification.  DFR on LAD stenosis 0.94.  Widely patent circumflex  Very tortuous proximal right coronary with 30 to 40% proximal narrowing.  No significant obstruction identified.  Normal LV function.  RECOMMENDATIONS:   Given the location of the LAD stenosis, he needs aggressive preventive therapy which  could include high intensity statin therapy, VASCEPA9 (if TG > 140), and SGLT2.  The patient does snore and perhaps some of his complaints are related to sleep deprivation from obstructive sleep apnea.  Consider sleep study.  Resume anticoagulation therapy this evening.  Myocardial Perfusion 07/16/2018  No diagnostic ST segment changes to indicate ischemia. No chest pain was reported. Occasional PACs and PVCs noted without sustained arrhythmia. Low risk Duke treadmill score of 5.5.  No significant myocardial perfusion defects to indicate scar or ischemia.  This is a low risk study.  Nuclear stress EF: 64%. Past Medical History:  Diagnosis Date  . Aortic atherosclerosis ([place]) 07/29/2020   Incidental CT finding.   . Barrett's esophagus    short-segment, diagnosed in 2007 by Dr. [name]  . BPH (benign prostatic hyperplasia)   . CAD (coronary artery disease)    a. cath on 07/21/2019 showing patent LM and LCx with 40-60% Proximal LAD and 30-40% Pro-RCA stenosis with normal LV function.  . Diabetes mellitus without complication (HCC)    type 1   . Dyspnea    with exertion   . GERD (gastroesophageal reflux disease)   . Hiatal hernia    small  . History of kidney stones   . HTN (hypertension)   . PAF (paroxysmal atrial fibrillation) ([place])   . S/P colonoscopy 2007   unremarkable  . S/P endoscopy Oct 2011    Salmon-colored epithelium coming up to 37 cm from the   . Vertigo     Past Surgical History:  Procedure Laterality Date  . APPENDECTOMY    . BIOPSY  01/28/2020   Procedure: BIOPSY;  Surgeon: [name], MD;  [facility];  Service: Endoscopy;;  esophagus  . CHOLECYSTECTOMY    . COLONOSCOPY  02/06/2006   Dr. [name]- single anal papilla, o/w normal rectum, normal colon  . COLONOSCOPY N/A 09/13/2016   RMR: three 5-6 mm for adenomas removed.  Recommend one last surveillance colonoscopy in October 2020.  [name] COLONOSCOPY WITH PROPOFOL N/A 01/28/2020   Procedure:  COLONOSCOPY WITH PROPOFOL;  Surgeon: [name], MD;  [facility];  Service: Endoscopy;  Laterality: N/A;  10:45am  . ESOPHAGOGASTRODUODENOSCOPY  09/15/2010   Dr. [name]- Salmon-colored epithelium coming up to 37 cm from the   . ESOPHAGOGASTRODUODENOSCOPY N/A 07/02/2013   Dr. [name]- Barretts on bx. hiatal hernia  . ESOPHAGOGASTRODUODENOSCOPY N/A 09/13/2016   RMR: Barrett's esophagus without dysplasia. one last EGD in 3 years.   . ESOPHAGOGASTRODUODENOSCOPY (EGD) WITH PROPOFOL N/A 01/28/2020   Procedure: ESOPHAGOGASTRODUODENOSCOPY (EGD) WITH PROPOFOL;  Surgeon: [name], MD;  [facility];  Service: Endoscopy;  Laterality: N/A;  . INTRAVASCULAR PRESSURE WIRE/FFR STUDY N/A 07/21/2019   Procedure: INTRAVASCULAR PRESSURE WIRE/FFR STUDY;  Surgeon: [name], MD;  Location: [facility];  Service: Cardiovascular;  Laterality: N/A;  . LEFT HEART CATH AND CORONARY ANGIOGRAPHY N/A 07/21/2019   Procedure: LEFT HEART CATH AND CORONARY ANGIOGRAPHY;  Surgeon: [name], MD;  Location: [facility];  Service: Cardiovascular;  Laterality: N/A;  .  POLYPECTOMY  01/28/2020   Procedure: POLYPECTOMY;  Surgeon: [name], MD;  [facility];  Service: Endoscopy;;  colon  . ROTATOR CUFF REPAIR    . YAG LASER APPLICATION Right 11/24/1094   Procedure: YAG LASER APPLICATION;  Surgeon: [name], MD;  Location[facility];  Service: Ophthalmology;  Laterality: Right;    MEDICATIONS: . acetaminophen (TYLENOL) 500 MG tablet  . allopurinol (ZYLOPRIM) 100 MG tablet  . ALPRAZolam (XANAX) 1 MG tablet  . Cholecalciferol (VITAMIN D3) 50 MCG (2000 UT) TABS  . DULoxetine (CYMBALTA) 60 MG capsule  . fexofenadine (ALLEGRA) 180 MG tablet  . gabapentin (NEURONTIN) 300 MG capsule  . glipiZIDE (GLUCOTROL) 5 MG tablet  . Menthol, Topical Analgesic, (BIOFREEZE ROLL-ON EX)  . metoprolol tartrate (LOPRESSOR) 50 MG tablet  . pantoprazole (PROTONIX) 40 MG tablet  .  tamsulosin (FLOMAX) 0.4 MG CAPS capsule  . traMADol (ULTRAM) 50 MG tablet  . warfarin (COUMADIN) 5 MG tablet   No current facility-administered medications for this encounter.    [name] Felix, PA-C [facility] [phone]

## 2020-08-15 NOTE — Patient Instructions (Signed)
Medication Instructions:   Your physician recommends that you continue on your current medications as directed. Please refer to the Current Medication list given to you today.  Labwork:  None  Testing/Procedures:  None  Follow-Up:  Your physician recommends that you schedule a follow-up appointment in: 6 months.  Any Other Special Instructions Will Be Listed Below (If Applicable).  You have been referred to Dr. Dorris Fetch [facility])  If you need a refill on your cardiac medications before your next appointment, please call your pharmacy.

## 2020-08-16 ENCOUNTER — Telehealth: Payer: Self-pay

## 2020-08-16 NOTE — Telephone Encounter (Signed)
Can this pt see Whitney?

## 2020-08-16 NOTE — Telephone Encounter (Signed)
Yes, she can.

## 2020-08-18 ENCOUNTER — Ambulatory Visit (HOSPITAL_COMMUNITY): Payer: Medicare Other

## 2020-08-18 ENCOUNTER — Ambulatory Visit (HOSPITAL_COMMUNITY)
Admission: RE | Admit: 2020-08-18 | Discharge: 2020-08-18 | Disposition: A | Discharge status: Home / Self Care | Payer: Medicare Other | Attending: Urology | Admitting: Urology

## 2020-08-18 ENCOUNTER — Ambulatory Visit (HOSPITAL_COMMUNITY): Payer: Medicare Other | Admitting: Registered Nurse

## 2020-08-18 ENCOUNTER — Ambulatory Visit (HOSPITAL_COMMUNITY): Payer: Medicare Other | Admitting: Physician Assistant

## 2020-08-18 ENCOUNTER — Encounter (HOSPITAL_COMMUNITY): Payer: Self-pay | Admitting: Urology

## 2020-08-18 ENCOUNTER — Encounter (HOSPITAL_COMMUNITY)
Admission: RE | Disposition: A | Discharge status: Home / Self Care | Payer: Self-pay | Source: Home / Self Care | Attending: Urology

## 2020-08-18 DIAGNOSIS — Z7952 Long term (current) use of systemic steroids: Secondary | ICD-10-CM | POA: Diagnosis not present

## 2020-08-18 DIAGNOSIS — Z7984 Long term (current) use of oral hypoglycemic drugs: Secondary | ICD-10-CM | POA: Diagnosis not present

## 2020-08-18 DIAGNOSIS — K219 Gastro-esophageal reflux disease without esophagitis: Secondary | ICD-10-CM | POA: Diagnosis not present

## 2020-08-18 DIAGNOSIS — N401 Enlarged prostate with lower urinary tract symptoms: Secondary | ICD-10-CM | POA: Diagnosis not present

## 2020-08-18 DIAGNOSIS — I480 Paroxysmal atrial fibrillation: Secondary | ICD-10-CM | POA: Insufficient documentation

## 2020-08-18 DIAGNOSIS — E119 Type 2 diabetes mellitus without complications: Secondary | ICD-10-CM | POA: Diagnosis not present

## 2020-08-18 DIAGNOSIS — Z87891 Personal history of nicotine dependence: Secondary | ICD-10-CM | POA: Insufficient documentation

## 2020-08-18 DIAGNOSIS — K2270 Barrett's esophagus without dysplasia: Secondary | ICD-10-CM | POA: Diagnosis not present

## 2020-08-18 DIAGNOSIS — I10 Essential (primary) hypertension: Secondary | ICD-10-CM | POA: Insufficient documentation

## 2020-08-18 DIAGNOSIS — N3289 Other specified disorders of bladder: Secondary | ICD-10-CM | POA: Diagnosis not present

## 2020-08-18 DIAGNOSIS — N210 Calculus in bladder: Secondary | ICD-10-CM | POA: Diagnosis not present

## 2020-08-18 DIAGNOSIS — N138 Other obstructive and reflux uropathy: Secondary | ICD-10-CM | POA: Diagnosis not present

## 2020-08-18 DIAGNOSIS — Z87442 Personal history of urinary calculi: Secondary | ICD-10-CM | POA: Diagnosis not present

## 2020-08-18 DIAGNOSIS — M109 Gout, unspecified: Secondary | ICD-10-CM | POA: Insufficient documentation

## 2020-08-18 DIAGNOSIS — I25119 Atherosclerotic heart disease of native coronary artery with unspecified angina pectoris: Secondary | ICD-10-CM | POA: Insufficient documentation

## 2020-08-18 DIAGNOSIS — Z7901 Long term (current) use of anticoagulants: Secondary | ICD-10-CM | POA: Insufficient documentation

## 2020-08-18 DIAGNOSIS — R351 Nocturia: Secondary | ICD-10-CM | POA: Diagnosis not present

## 2020-08-18 DIAGNOSIS — E7849 Other hyperlipidemia: Secondary | ICD-10-CM | POA: Diagnosis not present

## 2020-08-18 DIAGNOSIS — Z79899 Other long term (current) drug therapy: Secondary | ICD-10-CM | POA: Insufficient documentation

## 2020-08-18 DIAGNOSIS — E109 Type 1 diabetes mellitus without complications: Secondary | ICD-10-CM | POA: Diagnosis not present

## 2020-08-18 DIAGNOSIS — Q631 Lobulated, fused and horseshoe kidney: Secondary | ICD-10-CM | POA: Diagnosis not present

## 2020-08-18 DIAGNOSIS — N201 Calculus of ureter: Secondary | ICD-10-CM | POA: Insufficient documentation

## 2020-08-18 DIAGNOSIS — N2889 Other specified disorders of kidney and ureter: Secondary | ICD-10-CM | POA: Diagnosis not present

## 2020-08-18 DIAGNOSIS — R972 Elevated prostate specific antigen [PSA]: Secondary | ICD-10-CM | POA: Insufficient documentation

## 2020-08-18 HISTORY — PX: CYSTOSCOPY WITH RETROGRADE PYELOGRAM, URETEROSCOPY AND STENT PLACEMENT: SHX5789

## 2020-08-18 LAB — GLUCOSE, CAPILLARY
Glucose-Capillary: 134 mg/dL — ABNORMAL HIGH (ref 70–99)
Glucose-Capillary: 148 mg/dL — ABNORMAL HIGH (ref 70–99)

## 2020-08-18 SURGERY — CYSTOURETEROSCOPY, WITH RETROGRADE PYELOGRAM AND STENT INSERTION
Anesthesia: General | Laterality: Right

## 2020-08-18 MED ORDER — DEXAMETHASONE SODIUM PHOSPHATE 10 MG/ML IJ SOLN
INTRAMUSCULAR | Status: DC | PRN
  Administered 2020-08-18: 8 mg via INTRAVENOUS

## 2020-08-18 MED ORDER — FENTANYL CITRATE (PF) 100 MCG/2ML IJ SOLN: 25.0000 ug | INTRAMUSCULAR | Status: DC | PRN

## 2020-08-18 MED ORDER — FENTANYL CITRATE (PF) 100 MCG/2ML IJ SOLN
INTRAMUSCULAR | Status: AC
Start: 2020-08-18 — End: ?
  Filled 2020-08-18: qty 2

## 2020-08-18 MED ORDER — TRAMADOL HCL 50 MG PO TABS
50.0000 mg | ORAL_TABLET | Freq: Four times a day (QID) | ORAL | 0 refills | Status: AC | PRN
Start: 2020-08-18 — End: ?

## 2020-08-18 MED ORDER — LIDOCAINE 2% (20 MG/ML) 5 ML SYRINGE
INTRAMUSCULAR | Status: DC | PRN
  Administered 2020-08-18: 100 mg via INTRAVENOUS

## 2020-08-18 MED ORDER — PROPOFOL 10 MG/ML IV BOLUS
INTRAVENOUS | Status: AC
  Filled 2020-08-18: qty 20

## 2020-08-18 MED ORDER — EPHEDRINE SULFATE-NACL 50-0.9 MG/10ML-% IV SOSY
PREFILLED_SYRINGE | INTRAVENOUS | Status: DC | PRN
  Administered 2020-08-18: 10 mg via INTRAVENOUS

## 2020-08-18 MED ORDER — DEXAMETHASONE SODIUM PHOSPHATE 10 MG/ML IJ SOLN
INTRAMUSCULAR | Status: AC
  Filled 2020-08-18: qty 1

## 2020-08-18 MED ORDER — CEFAZOLIN SODIUM-DEXTROSE 2-4 GM/100ML-% IV SOLN
2.0000 g | INTRAVENOUS | Status: AC
  Administered 2020-08-18: 2 g via INTRAVENOUS
  Filled 2020-08-18: qty 100

## 2020-08-18 MED ORDER — ONDANSETRON HCL 4 MG/2ML IJ SOLN
INTRAMUSCULAR | Status: DC | PRN
  Administered 2020-08-18: 4 mg via INTRAVENOUS

## 2020-08-18 MED ORDER — SODIUM CHLORIDE 0.9% FLUSH: 3.0000 mL | Freq: Two times a day (BID) | INTRAVENOUS | Status: DC

## 2020-08-18 MED ORDER — EPHEDRINE 5 MG/ML INJ
INTRAVENOUS | Status: AC
  Filled 2020-08-18: qty 10

## 2020-08-18 MED ORDER — FENTANYL CITRATE (PF) 100 MCG/2ML IJ SOLN
INTRAMUSCULAR | Status: DC | PRN
Start: 2020-08-18 — End: 2020-08-18
  Administered 2020-08-18: 50 ug via INTRAVENOUS
  Administered 2020-08-18: 25 ug via INTRAVENOUS

## 2020-08-18 MED ORDER — ONDANSETRON HCL 4 MG/2ML IJ SOLN
INTRAMUSCULAR | Status: AC
  Filled 2020-08-18: qty 2

## 2020-08-18 MED ORDER — ORAL CARE MOUTH RINSE: 15.0000 mL | Freq: Once | OROMUCOSAL | Status: AC

## 2020-08-18 MED ORDER — LACTATED RINGERS IV SOLN: INTRAVENOUS | Status: DC

## 2020-08-18 MED ORDER — LIDOCAINE 2% (20 MG/ML) 5 ML SYRINGE
INTRAMUSCULAR | Status: AC
  Filled 2020-08-18: qty 5

## 2020-08-18 MED ORDER — PROPOFOL 10 MG/ML IV BOLUS
INTRAVENOUS | Status: DC | PRN
  Administered 2020-08-18: 200 mg via INTRAVENOUS

## 2020-08-18 MED ORDER — CHLORHEXIDINE GLUCONATE 0.12 % MT SOLN
15.0000 mL | Freq: Once | OROMUCOSAL | Status: AC
  Administered 2020-08-18: 15 mL via OROMUCOSAL

## 2020-08-18 MED ORDER — SODIUM CHLORIDE 0.9 % IR SOLN
Status: DC | PRN
  Administered 2020-08-18: 9000 mL

## 2020-08-18 SURGICAL SUPPLY — 22 items
BAG URO CATCHER STRL LF (MISCELLANEOUS) ×3 IMPLANT
BASKET STONE NCOMPASS (UROLOGICAL SUPPLIES) IMPLANT
CATH URET 5FR 28IN OPEN ENDED (CATHETERS) ×3 IMPLANT
CATH URET DUAL LUMEN 6-10FR 50 (CATHETERS) IMPLANT
CLOTH BEACON ORANGE TIMEOUT ST (SAFETY) ×3 IMPLANT
EXTRACTOR STONE NITINOL NGAGE (UROLOGICAL SUPPLIES) IMPLANT
GLOVE SURG SS PI 8.0 STRL IVOR (GLOVE) ×3 IMPLANT
GOWN STRL REUS W/TWL XL LVL3 (GOWN DISPOSABLE) ×3 IMPLANT
GUIDEWIRE STR DUAL SENSOR (WIRE) ×3 IMPLANT
IV NS IRRIG 3000ML ARTHROMATIC (IV SOLUTION) ×6 IMPLANT
KIT TURNOVER KIT A (KITS) IMPLANT
LASER FIB FLEXIVA PULSE ID 365 (Laser) IMPLANT
LASER FIB FLEXIVA PULSE ID 550 (Laser) ×3 IMPLANT
LASER FIB FLEXIVA PULSE ID 910 (Laser) IMPLANT
MANIFOLD NEPTUNE II (INSTRUMENTS) ×3 IMPLANT
PACK CYSTO (CUSTOM PROCEDURE TRAY) ×3 IMPLANT
SHEATH URETERAL 12FRX35CM (MISCELLANEOUS) IMPLANT
TRACTIP FLEXIVA PULS ID 200XHI (Laser) IMPLANT
TRACTIP FLEXIVA PULSE ID 200 (Laser)
TUBING CONNECTING 10 (TUBING) ×2 IMPLANT
TUBING CONNECTING 10' (TUBING) ×1
TUBING UROLOGY SET (TUBING) ×3 IMPLANT

## 2020-08-18 NOTE — Anesthesia Postprocedure Evaluation (Signed)
Anesthesia Post Note  Patient: REISE GLADNEY  Procedure(s) Performed: CYSTOSCOPY WITH INCISION OF RIGHT URETEROCELE, URETEROSCOPY WITH HOLMIUM LASER, CYSTOLITHOLAPAXY OF 10 MM BLADDER STONE (Right )     Patient location during evaluation: PACU Anesthesia Type: General Level of consciousness: awake and alert Pain management: pain level controlled Vital Signs Assessment: post-procedure vital signs reviewed and stable Respiratory status: spontaneous breathing, nonlabored ventilation and respiratory function stable Cardiovascular status: blood pressure returned to baseline and stable Postop Assessment: no apparent nausea or vomiting Anesthetic complications: no   No complications documented.  Last Vitals:  Vitals:   08/18/20 1315 08/18/20 1330  BP:  130/78  Pulse: (!) 59 69  Resp: 15 18  Temp:    SpO2: 97% 96%    Last Pain:  Vitals:   08/18/20 1330  TempSrc:   PainSc: 0-No pain                 Merlinda Frederick

## 2020-08-18 NOTE — Op Note (Signed)
Procedure: 1.  Cystoscopy with incision of right ureterocele. 2.  Cystolitholapaxy for less than 2 cm bladder stone. 3.  Application of fluoroscopy.  Preop diagnosis: 10 x 8 mm right distal ureteral stone.  Postop diagnosis: Same with probable right ureterocele.  Surgeon: Dr. [name].  Anesthesia: General.  Specimen: Stone fragments.  Drain: None.  EBL: 2 mL.  Complications: None.  Indications: The patient is a 79 year old male with a history of a horseshoe kidney with nephrolithiasis who recently presented with right flank pain and was found to have a 10 x 8 mm stone at the right UVJ.  It was felt that ureteroscopy with laser was indicated.  Procedure: He was taken to the operating room where he was given 2 g of Ancef.  A general anesthetic was induced.  Was placed in lithotomy position and fitted with PAS hose.  His perineum and genitalia were prepped with Betadine solution he was draped in usual sterile fashion.  Cystoscopy was performed in 23 [place] scope and 30 degree lens.  Examination revealed a normal urethra.  The external sphincter was intact.  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  Examination of bladder revealed mild to moderate trabeculation without tumors, stones or inflammation.  The left ureteral orifice was unremarkable.  The right ureteral orifice was somewhat mounded up with the stone visible at the meatus and the configuration of the distal ureter with the stone was suggestive of a right ureterocele.  An attempt was made to pass the guidewire by the stone but it was too tightly impacted.  At this point I felt the incision of the ureter over the stone to aid removal was indicated.  The continuous-flow resectoscope sheath was then placed with the aid of the visual obturator.  The sheath was then fitted with an Beatrix Fetters handle with a bipolar Collins knife and the 30 degree lens.  Incisions of the urethral wall  were made along its length both proximally and distally to the stone.  The stone was quite irregular and impacted but was eventually teased out into the bladder.  There was some minor bleeding from the incised areas which are gently cauterized with the tip of the [employer].  The resectoscope sheath was then fitted with the greenlight laser bridge and the 500 m laser fiber.  The stone was engaged on the laser setting of 1 J and 15 Hz and broken into manageable fragments which were then aspirated through the scope.  The stone was quite hard and consistent with calcium oxalate monohydrate.  Once the stone had been adequately fragmented and all of the fragments of significance removed, the repeat resectoscope handle was then replaced and a few additional areas of bleeding on the incised edge of the ureter were gently cauterized along with some points of bleeding in the proximal prostatic urethra.  Final inspection revealed no active bleeding and the right distal ureter was widely patent.  The scope was removed after the bladder was partially drained.  He was taken down from lithotomy position, his anesthetic was reversed and he was moved to recovery room in stable condition.  There were no complications.  The stone fragments will be given to his family for him to be.

## 2020-08-18 NOTE — Anesthesia Preprocedure Evaluation (Addendum)
Anesthesia Evaluation  Patient identified by MRN, date of birth, ID band Patient awake    Reviewed: Allergy & Precautions, NPO status , Patient's Chart, lab work & pertinent test results  Airway Mallampati: II  TM Distance: >3 FB Neck ROM: Full    Dental no notable dental hx.    Pulmonary former smoker,    Pulmonary exam normal breath sounds clear to auscultation       Cardiovascular hypertension, + angina + CAD  + dysrhythmias (paroxysmal afib on Warfarin (off x 5 days)) Atrial Fibrillation  Rhythm:Regular Rate:Normal  04/19/20:   1. Left ventricular ejection fraction, by estimation, is 65 to 70%. The  left ventricle has normal function. The left ventricle has no regional  wall motion abnormalities. Left ventricular diastolic parameters were  normal.  2. Right ventricular systolic function is normal. The right ventricular  size is normal. There is normal pulmonary artery systolic pressure. The  estimated right ventricular systolic pressure is 63.8 mmHg.  3. The mitral valve is grossly normal. Trivial mitral valve  regurgitation.  4. The aortic valve is tricuspid. Aortic valve regurgitation is trivial.  5. Aortic dilatation noted. There is mild dilatation of the aortic root.  6. The inferior vena cava is normal in size with greater than 50%  respiratory variability, suggesting right atrial pressure of 3 mmHg.    Neuro/Psych  Neuromuscular disease (lumbar radiculopathy) negative psych ROS   GI/Hepatic hiatal hernia, GERD  ,  Endo/Other  diabetes, Type 1  Renal/GU Renal disease (kidney stones)  negative genitourinary   Musculoskeletal  (+) Arthritis ,   Abdominal Normal abdominal exam  (+)   Peds  Hematology negative hematology ROS (+)   Anesthesia Other Findings   Reproductive/Obstetrics negative OB ROS                           Anesthesia Physical Anesthesia Plan  ASA:  III  Anesthesia Plan: General   Post-op Pain Management:    Induction:   PONV Risk Score and Plan: 2 and Dexamethasone, Ondansetron and Midazolam  Airway Management Planned: LMA  Additional Equipment:   Intra-op Plan:   Post-operative Plan: Extubation in OR  Informed Consent: I have reviewed the patients History and Physical, chart, labs and discussed the procedure including the risks, benefits and alternatives for the proposed anesthesia with the patient or authorized representative who has indicated his/her understanding and acceptance.     Dental advisory given  Plan Discussed with: Anesthesiologist, Surgeon and CRNA  Anesthesia Plan Comments:        Anesthesia Quick Evaluation

## 2020-08-18 NOTE — Transfer of Care (Signed)
Immediate Anesthesia Transfer of Care Note  Patient: [name]  Procedure(s) Performed: CYSTOSCOPY WITH INCISION OF RIGHT URETEROCELE, URETEROSCOPY WITH HOLMIUM LASER, CYSTOLITHOLAPAXY OF 10 MM BLADDER STONE (Right )  Patient Location: PACU  Anesthesia Type:General  Level of Consciousness: awake, alert , oriented and patient cooperative  Airway & Oxygen Therapy: Patient Spontanous Breathing and Patient connected to face mask oxygen  Post-op Assessment: Report given to RN, Post -op Vital signs reviewed and stable and Patient moving all extremities  Post vital signs: Reviewed and stable  Last Vitals:  Vitals Value Taken Time  BP 138/85 08/18/20 1255  Temp    Pulse 70 08/18/20 1257  Resp 20 08/18/20 1257  SpO2 100 % 08/18/20 1257  Vitals shown include unvalidated device data.  Last Pain:  Vitals:   08/18/20 1109  TempSrc:   PainSc: 5       Patients Stated Pain Goal: 4 (00/51/10 2111)  Complications: No complications documented.

## 2020-08-18 NOTE — Discharge Instructions (Signed)

## 2020-08-18 NOTE — Anesthesia Procedure Notes (Signed)
Procedure Name: LMA Insertion Date/Time: 08/18/2020 12:01 PM Performed by: Victoriano Lain, CRNA Pre-anesthesia Checklist: Patient identified, Emergency Drugs available, Suction available, Patient being monitored and Timeout performed Patient Re-evaluated:Patient Re-evaluated prior to induction Oxygen Delivery Method: Circle system utilized Preoxygenation: Pre-oxygenation with 100% oxygen Induction Type: IV induction Ventilation: Mask ventilation without difficulty LMA: LMA with gastric port inserted LMA Size: 4.0 Number of attempts: 1 Placement Confirmation: positive ETCO2 and breath sounds checked- equal and bilateral Tube secured with: Tape Dental Injury: Teeth and Oropharynx as per pre-operative assessment

## 2020-08-18 NOTE — Interval H&P Note (Signed)
History and Physical Interval Note:  08/18/2020 11:41 AM  [name]  has presented today for surgery, with the diagnosis of RIGHT DISTAL URETERAL  STONE.  The various methods of treatment have been discussed with the patient and family. After consideration of risks, benefits and other options for treatment, the patient has consented to  Procedure(s): CYSTOSCOPY WITH RIGHT RETROGRADE PYELOGRAM, URETEROSCOPY WITH HOLMIUM LASER AND STENT PLACEMENT (Right) as a surgical intervention.  The patient's history has been reviewed, patient examined, no change in status, stable for surgery.  I have reviewed the patient's chart and labs.  Questions were answered to the patient's satisfaction.     [name]

## 2020-08-19 ENCOUNTER — Encounter (HOSPITAL_COMMUNITY): Payer: Self-pay | Admitting: Urology

## 2020-08-23 ENCOUNTER — Telehealth: Payer: Self-pay

## 2020-08-23 ENCOUNTER — Encounter: Payer: Self-pay | Admitting: Nurse Practitioner

## 2020-08-23 ENCOUNTER — Ambulatory Visit (INDEPENDENT_AMBULATORY_CARE_PROVIDER_SITE_OTHER): Payer: Medicare Other | Admitting: Nurse Practitioner

## 2020-08-23 ENCOUNTER — Other Ambulatory Visit: Payer: Self-pay

## 2020-08-23 VITALS — BP 112/72 | HR 73 | Ht 71.0 in | Wt 209.0 lb

## 2020-08-23 DIAGNOSIS — E1165 Type 2 diabetes mellitus with hyperglycemia: Secondary | ICD-10-CM | POA: Diagnosis not present

## 2020-08-23 MED ORDER — ACCU-CHEK FASTCLIX LANCETS MISC: 1.0000 "application " | Freq: Two times a day (BID) | 3 refills | Status: DC

## 2020-08-23 MED ORDER — ACCU-CHEK GUIDE VI STRP: ORAL_STRIP | 12 refills | Status: DC

## 2020-08-23 NOTE — Telephone Encounter (Signed)
Whitney, Pt called to let you know his meter, its called [employer]

## 2020-08-23 NOTE — Patient Instructions (Signed)

## 2020-08-23 NOTE — Progress Notes (Signed)
Endocrinology Consult Note       08/23/2020, 3:15 PM   Subjective:    Patient ID: [name], male    DOB: [birth year].  [name] is being seen in consultation for management of currently uncontrolled symptomatic diabetes requested by  Redmond School, MD.  He is a retired Engineer, structural for [employer].   Past Medical History:  Diagnosis Date   Aortic atherosclerosis ([place]) 07/29/2020   Incidental CT finding.    Barrett's esophagus    short-segment, diagnosed in 2007 by Dr. [name]   BPH (benign prostatic hyperplasia)    CAD (coronary artery disease)    a. cath on 07/21/2019 showing patent LM and LCx with 40-60% Proximal LAD and 30-40% Pro-RCA stenosis with normal LV function.   Diabetes mellitus without complication (HCC)    type 1    Dyspnea    with exertion    GERD (gastroesophageal reflux disease)    Hiatal hernia    small   History of kidney stones    HTN (hypertension)    PAF (paroxysmal atrial fibrillation) ([place])    S/P colonoscopy 2007   unremarkable   S/P endoscopy Oct 2011    Salmon-colored epithelium coming up to 37 cm from the    Vertigo     Past Surgical History:  Procedure Laterality Date   APPENDECTOMY     BIOPSY  01/28/2020   Procedure: BIOPSY;  Surgeon: [name], MD;  Location: [facility];  Service: Endoscopy;;  esophagus   CHOLECYSTECTOMY     COLONOSCOPY  02/06/2006   Dr. [name]- single anal papilla, o/w normal rectum, normal colon   COLONOSCOPY N/A 09/13/2016   RMR: three 5-6 mm for adenomas removed.  Recommend one last surveillance colonoscopy in October 2020.   COLONOSCOPY WITH PROPOFOL N/A 01/28/2020   Procedure: COLONOSCOPY WITH PROPOFOL;  Surgeon: [name], MD;  [facility]: [facility];  Service: Endoscopy;  Laterality: N/A;  10:45am   CYSTOSCOPY WITH RETROGRADE PYELOGRAM, URETEROSCOPY AND STENT PLACEMENT Right  08/18/2020   Procedure: CYSTOSCOPY WITH INCISION OF RIGHT URETEROCELE, URETEROSCOPY WITH HOLMIUM LASER, CYSTOLITHOLAPAXY OF 10 MM BLADDER STONE;  Surgeon: [name], MD;  Location: [facility];  Service: Urology;  Laterality: Right;   ESOPHAGOGASTRODUODENOSCOPY  09/15/2010   Dr. [name]- Salmon-colored epithelium coming up to 37 cm from the    ESOPHAGOGASTRODUODENOSCOPY N/A 07/02/2013   Dr. [name]- Barretts on bx. hiatal hernia   ESOPHAGOGASTRODUODENOSCOPY N/A 09/13/2016   RMR: Barrett's esophagus without dysplasia. one last EGD in 3 years.    ESOPHAGOGASTRODUODENOSCOPY (EGD) WITH PROPOFOL N/A 01/28/2020   Procedure: ESOPHAGOGASTRODUODENOSCOPY (EGD) WITH PROPOFOL;  Surgeon: [name], MD;  [facility];  Service: Endoscopy;  Laterality: N/A;   INTRAVASCULAR PRESSURE WIRE/FFR STUDY N/A 07/21/2019   Procedure: INTRAVASCULAR PRESSURE WIRE/FFR STUDY;  Surgeon: [name], MD;  Location: [facility];  Service: Cardiovascular;  Laterality: N/A;   LEFT HEART CATH AND CORONARY ANGIOGRAPHY N/A 07/21/2019   Procedure: LEFT HEART CATH AND CORONARY ANGIOGRAPHY;  Surgeon: [name], MD;  Location: [facility];  Service: Cardiovascular;  Laterality:  N/A;   POLYPECTOMY  01/28/2020   Procedure: POLYPECTOMY;  Surgeon: [name], MD;  [facility];  Service: Endoscopy;;  colon   ROTATOR CUFF REPAIR     YAG LASER APPLICATION Right 0/07/3266   Procedure: YAG LASER APPLICATION;  Surgeon: [name], MD;  Location: [facility];  Service: Ophthalmology;  Laterality: Right;    Social History   Socioeconomic History   Marital status: Married    Spouse name: Not on file   Number of children: Not on file   Years of education: Not on file   Highest education level: Not on file  Occupational History   Not on file  Tobacco Use   Smoking status: Former Smoker    Packs/day: 1.50    Years: 30.00    Pack years: 45.00    Types: Cigarettes    Quit date:  11/18/1993    Years since quitting: 26.7   Smokeless tobacco: Never Used   Tobacco comment: quit in the 90's  Vaping Use   Vaping Use: Never used  Substance and Sexual Activity   Alcohol use: No   Drug use: No   Sexual activity: Not on file  Other Topics Concern   Not on file  Social History Narrative   Not on file   Social Determinants of Health   Financial Resource Strain:    Difficulty of Paying Living Expenses: Not on file  Food Insecurity:    Worried About [place] in the Last Year: Not on file   Ran Out of Food in the Last Year: Not on file  Transportation Needs:    Lack of Transportation (Medical): Not on file   Lack of Transportation (Non-Medical): Not on file  Physical Activity:    Days of Exercise per Week: Not on file   Minutes of Exercise per Session: Not on file  Stress:    Feeling of Stress : Not on file  Social Connections:    Frequency of Communication with Friends and Family: Not on file   Frequency of Social Gatherings with Friends and Family: Not on file   Attends Religious Services: Not on file   Active Member of Clubs or Organizations: Not on file   Attends Archivist Meetings: Not on file   Marital Status: Not on file    Family History  Problem Relation Age of Onset   Prostate cancer Father        deceased   Stroke Mother        deceased   Colon cancer Neg Hx     Outpatient Encounter Medications as of 08/23/2020  Medication Sig   acetaminophen (TYLENOL) 500 MG tablet Take 1,000 mg by mouth every 6 (six) hours as needed for moderate pain.    allopurinol (ZYLOPRIM) 100 MG tablet Take 100 mg by mouth daily.    ALPRAZolam (XANAX) 1 MG tablet Take 0.5 mg by mouth at bedtime as needed for anxiety or sleep.    Cholecalciferol (VITAMIN D3) 50 MCG (2000 UT) TABS Take 2,000 Units by mouth daily.   fexofenadine (ALLEGRA) 180 MG tablet Take 180 mg by mouth daily.   gabapentin (NEURONTIN) 300 MG capsule  Take 300 mg by mouth 2 (two) times daily.    glipiZIDE (GLUCOTROL) 5 MG tablet Take 5 mg by mouth daily before breakfast.    pantoprazole (PROTONIX) 40 MG tablet Take 1 tablet (40 mg total) by mouth 2 (two) times daily before a meal.   tamsulosin (  FLOMAX) 0.4 MG CAPS capsule Take 1 capsule (0.4 mg total) by mouth 2 (two) times daily.   traMADol (ULTRAM) 50 MG tablet Take 1 tablet (50 mg total) by mouth every 6 (six) hours as needed for moderate pain (pain.).   warfarin (COUMADIN) 5 MG tablet TAKE ONE TABLET BY MOUTH DAILY EXCEPT ONE-HALF TABLET ON WEDNESDAYS OR AS DIRECTED (Patient taking differently: Take 2.5-5 mg by mouth See admin instructions. Take 1 tablet (5 mg) by mouth daily, except take 0.5 tablet (2.5 mg) by mouth on Wednesdays or as directed)   Accu-Chek FastClix Lancets MISC 1 application by Does not apply route 2 (two) times daily.   glucose blood (ACCU-CHEK GUIDE) test strip Use to monitor blood glucose at least twice daily, before breakfast and before bed   No facility-administered encounter medications on file as of 08/23/2020.    ALLERGIES: No Known Allergies  VACCINATION STATUS: Immunization History  Administered Date(s) Administered   Influenza-Unspecified 09/19/2013    Diabetes He presents for his initial diabetic visit. He has type 2 diabetes mellitus. The initial diagnosis of diabetes was made 3 years (diagnosed at approximate age of 43) ago. His disease course has been fluctuating. Hypoglycemia symptoms include nervousness/anxiousness, sweats and tremors. Pertinent negatives for hypoglycemia include no confusion, dizziness, headaches or seizures. Associated symptoms include fatigue, foot paresthesias, polydipsia and polyuria. Pertinent negatives for diabetes include no chest pain, no foot ulcerations, no visual change and no weight loss. There are no hypoglycemic complications. Symptoms are stable. Diabetic complications include heart disease and peripheral  neuropathy. Risk factors for coronary artery disease include diabetes mellitus, dyslipidemia, hypertension, male sex, obesity and sedentary lifestyle. Current diabetic treatment includes oral agent (monotherapy). He is compliant with treatment most of the time. His weight is fluctuating minimally. He is following a generally unhealthy diet. When asked about meal planning, he reported none. He has not had a previous visit with a dietitian. He rarely participates in exercise. (He presents today for his consultation with no meter or logs to review.  He states he does not regularly check blood glucose at home unless he is "feeling off."  He is currently on Glipizide 5 mg po daily and reports rare (maybe once a week) episodes of hypoglycemia which corrects with a snack.  His previsit A1C was 7.1% on 08/12/20.) An ACE inhibitor/angiotensin II receptor blocker is not being taken. He does not see a podiatrist.Eye exam is current.    Review of systems  Constitutional: + Minimally fluctuating body weight,  current Body mass index is 29.15 kg/m. , + fatigue, no subjective hyperthermia, no subjective hypothermia Eyes: no blurry vision, no xerophthalmia ENT: no sore throat, no nodules palpated in throat, no dysphagia/odynophagia, no hoarseness Cardiovascular: no chest pain, no shortness of breath, no palpitations, no leg swelling Respiratory: no cough, no shortness of breath Gastrointestinal: no nausea/vomiting/diarrhea Musculoskeletal: no muscle/joint aches Skin: no rashes, no hyperemia Neurological: no tremors, + numbness/tingling to BLE (feet), no dizziness Psychiatric: no depression, no anxiety   Objective:    BP 112/72 (BP Location: Left Arm, Patient Position: Sitting)    Pulse 73    Ht 5\' 11"  (1.803 m)    Wt 209 lb (94.8 kg)    BMI 29.15 kg/m   Wt Readings from Last 3 Encounters:  08/23/20 209 lb (94.8 kg)  08/18/20 211 lb 9.6 oz (96 kg)  08/15/20 211 lb 9.6 oz (96 kg)    BP Readings from Last 3  Encounters:  08/23/20 112/72  08/18/20 125/74  08/15/20 90/60    Physical Exam- Limited  Constitutional:  Body mass index is 29.15 kg/m. , not in acute distress, normal state of mind Eyes:  EOMI, no exophthalmos Neck: Supple Thyroid: No gross goiter, nonpalpable, nontender Cardiovascular: RRR, no murmers, rubs, or gallops, no edema Respiratory: Adequate breathing efforts, no crackles, rales, rhonchi, or wheezing Musculoskeletal: no gross deformities, strength intact in all four extremities, no gross restriction of joint movements Skin:  no rashes, no hyperemia Neurological: no tremor with outstretched hands   CMP ( most recent) CMP     Component Value Date/Time   NA 141 08/12/2020 1336   K 3.9 08/12/2020 1336   CL 105 08/12/2020 1336   CO2 28 08/12/2020 1336   GLUCOSE 69 (L) 08/12/2020 1336   BUN 13 08/12/2020 1336   CREATININE 0.95 08/12/2020 1336   CREATININE 1.16 07/03/2019 1343   CALCIUM 9.3 08/12/2020 1336   PROT 6.7 03/27/2019 1322   ALBUMIN 4.2 03/27/2019 1322   AST 24 03/27/2019 1322   ALT 20 03/27/2019 1322   ALKPHOS 58 03/27/2019 1322   BILITOT 0.9 03/27/2019 1322   GFRNONAA >60 08/12/2020 1336   GFRAA >60 08/12/2020 1336     Diabetic Labs (most recent): Lab Results  Component Value Date   HGBA1C 7.1 (H) 08/12/2020     Lipid Panel ( most recent) Lipid Panel  No results found for: CHOL, TRIG, HDL, CHOLHDL, VLDL, LDLCALC, LDLDIRECT, LABVLDL    Lab Results  Component Value Date   TSH 3.235 05/02/2018         Assessment & Plan:   1. Type 2 diabetes mellitus with hyperglycemia, without long-term current use of insulin ([place])  He presents today for his consultation with no meter or logs to review (he recently ran out of test strips).  He states he does not regularly check blood glucose at home unless he is "feeling off."  He is currently on Glipizide 5 mg po daily and reports rare (maybe once a week) episodes of hypoglycemia which corrects with a  snack.  His previsit A1C was 7.1% on 08/12/20.  - [name] has currently uncontrolled symptomatic type 2 DM since 79 years of age, with most recent A1c of 7.1 %.    Recent labs reviewed.  - I had a long discussion with him about the progressive nature of diabetes and the pathology behind its complications.  -his diabetes is complicated by CAD, peripheral neuropathy and he remains at a high risk for more acute and chronic complications which include CAD, CVA, CKD, retinopathy, and neuropathy. These are all discussed in detail with him.  - I have counseled him on diet  and weight management  by adopting a carbohydrate restricted/protein rich diet. Patient is encouraged to switch to  unprocessed or minimally processed complex starch and increased protein intake (animal or plant source), fruits, and vegetables. -  he is advised to stick to a routine mealtimes to eat 3 meals  a day and avoid unnecessary snacks ( to snack only to correct hypoglycemia).   - he admits that there is a room for improvement in his food and drink choices. - Suggestion is made for him to avoid simple carbohydrates  from his diet including Cakes, Sweet Desserts, Ice Cream, Soda (diet and regular), Sweet Tea, Candies, Chips, Cookies, Store Bought Juices, Alcohol in Excess of  1-2 drinks a day, Artificial Sweeteners,  Coffee Creamer, and "Sugar-free" Products. This will help patient to have more stable blood glucose profile  and potentially avoid unintended weight gain.  -We can schedule a visit with Jearld Fenton, RDN, CDE for individualized nutrition education on subsequent visits.  - I have approached him with the following individualized plan to manage  his diabetes and patient agrees:   - He is advised to continue on Glipizide 5 mg po daily at this time. - He is encouraged to start monitoring and logging blood glucose consistently twice daily (sample meter provided from office), before breakfast and before bed, and  return to the clinic in 1 week for review at which point further medication adjustments can be made.  - He may benefit from low dose Metformin, will consider adding it at next visit.  - he is encouraged to call clinic for blood glucose levels less than 70 or above 300 mg /dl.  - he will be considered for incretin therapy as appropriate next visit.  - Specific targets for  A1c;  LDL, HDL,  and Triglycerides were discussed with the patient.  2) Blood Pressure /Hypertension:  His blood pressure is controlled to target.  He is not currently taking any antihypertensive medications at this time.  He should be considered for low dose ACE/ARB for renal protection if BP is elevated on subsequent visits.  3) Lipids/Hyperlipidemia:  There are no recent lipid panel results to review, nor does he take any medication for high cholesterol.  Will consider checking Lipid panel on subsequent visits as appropriate.   4)  Weight/Diet:  His Body mass index is 28.78 kg/m.  - complicating his diabetes care.   he is a candidate for some weight loss. I discussed with him the fact that loss of 5 - 10% of his  current body weight will have the most impact on his diabetes management.  Exercise, and detailed carbohydrates information provided  -  detailed on discharge instructions.  5) Chronic Care/Health Maintenance: -he is encouraged to initiate and continue to follow up with Ophthalmology, Dentist,  Podiatrist at least yearly or according to recommendations, and advised to stay away from smoking and/or secondhand smoke exposure. I have recommended yearly flu vaccine and pneumonia vaccine at least every 5 years; moderate intensity exercise for up to 150 minutes weekly; and  sleep for at least 7 hours a day.  - he is  advised to maintain close follow up with Redmond School, MD for primary care needs, as well as his other providers for optimal and coordinated care.   - Time spent in this patient care: 60 min, of which  > 50% was spent in  counseling  him about his diabetes and the rest reviewing his blood glucose logs , discussing his hypoglycemia and hyperglycemia episodes, reviewing his current and  previous labs / studies  (including abstraction from other facilities) and medications  doses and developing a  long term treatment plan based on the latest standards of care/ guidelines; and documenting his care.    Please refer to Patient Instructions for Blood Glucose Monitoring and Insulin/Medications Dosing Guide"  in media tab for additional information. Please  also refer to " Patient Self Inventory" in the Media  tab for reviewed elements of pertinent patient history.  [name] participated in the discussions, expressed understanding, and voiced agreement with the above plans.  All questions were answered to his satisfaction. he is encouraged to contact clinic should he have any questions or concerns prior to his return visit.   Follow up plan: - Return in about 1 week (around 08/30/2020) for  Diabetes follow up, No previsit labs, Bring glucometer and logs.  Rayetta Pigg, [facility] [address] [place], [place] [address] Phone: [phone] Fax: [phone]  08/23/2020, 3:15 PM  This note was partially dictated with voice recognition software. Similar sounding words can be transcribed inadequately or may not  be corrected upon review.

## 2020-08-23 NOTE — H&P (Signed)
Chief Complaint: Dysuria   History of Present Illness:   07/01/20: [name] returns today in f/u.  He reports resolution of the penile lesion.   He continues to have nocturia but he is getting up a lot with his wife who has to get up.   He remains on tamsulosin.  His IPSS is 24.  He has been having some progressive SOB and Dr. Melvyn Novas had some questions about whether the tamsulosin may be contributing to the breathing issues.   His PSA in 1/21 was 6.3 which was down from 9.7.  He has some LLQ discomfort at times.  He has a horseshoe kidney and a right renal stone on a CT in 2015.   3.23.2021: Most recent PSA was 6.3 on 1.5.2021. He presents today c/o of sore spot underneath his penis that he has had for the last 2 wks. This has been too painful to touch. He has tried managing pain with medications and hot baths without success. He has also tried to "pop" it without producing any drainage. This pain has started to lessen in severity but he has recently begun having some mild dysuria and increased nocturia. Otherwise, his urinary sx's are stable.   (below copied from [facility] records):  Elevated PSA:  [name] is a 79 year-old male established patient who is here for an elevated PSA.  His PSA is 6. He has not had a prostate nodule on a physical examination. He has not had recurrent prostate infections or chronic prostatitis.   He has not been on antibiotics for prostate infections previously. He has had a prostate biopsy done. His first prostate biopsy was done approximately 09/19/2010. He does have the pathology report from his biopsy.   He has a history of an elevated PSA with a negative biopsy in 2011. His PSA was 4.87 prior to the biopsy but declined on finasteride to 3.17 but he is off of that and the level has been rising slowly   04/24/19: [name] returns today in f/u for his history of an elevated PSA with a negative biopsy in 2011. His PSA was back down to 6.4 from 9.7 prior to this  visit.   He is currently on prednisone for gout.    01/27/19 10/23/18 10/17/17 09/18/16 09/06/15 03/02/15 04/03/14 06/13/12  PSA  Total PSA 6.4 ng/dl 9.7 ng/dl 5.3 ng/dl 5.8  4.66  5.64  5.20  4.36   Free PSA    1.0  0.56  0.58  0.64  0.62   % Free PSA  8 %  17  12  10  12  14     BPH:  He is currently taking tamsulosin. He is not on new medications for symptoms of prostate enlargement.   He does have an abnormal sensation when needing to urinate. He is not having problems getting his urine stream started. He does not have a good size and strength to his urinary stream. He is not having problems with emptying his bladder well. He does not dribble at the end of urination.   04/24/19: He has stable moderate to severe LUTS with an IPSS of 20 on tamsulosin. he has nocturia x 3 and a reduced stream. He had been on finasteride remotely. He has no associated signs or symptoms.      Past Medical History:  Diagnosis Date  . Barrett's esophagus    short-segment, diagnosed in 2007 by Dr. [name]  . BPH (benign prostatic hyperplasia)   . CAD (coronary artery disease)  a. cath on 07/21/2019 showing patent LM and LCx with 40-60% Proximal LAD and 30-40% Pro-RCA stenosis with normal LV function.  [name] GERD (gastroesophageal reflux disease)   . Hiatal hernia    small  . HTN (hypertension)   . PAF (paroxysmal atrial fibrillation) ([place])   . S/P colonoscopy 2007   unremarkable  . S/P endoscopy Oct 2011    Salmon-colored epithelium coming up to 37 cm from the   . Vertigo          Past Surgical History:  Procedure Laterality Date  . APPENDECTOMY    . BIOPSY  01/28/2020   Procedure: BIOPSY;  Surgeon: [name], MD;  [facility];  Service: Endoscopy;;  esophagus  . CHOLECYSTECTOMY    . COLONOSCOPY  02/06/2006   Dr. [name]- single anal papilla, o/w normal rectum, normal colon  . COLONOSCOPY N/A 09/13/2016   RMR: three 5-6 mm for adenomas removed.   Recommend one last surveillance colonoscopy in October 2020.  [name] COLONOSCOPY WITH PROPOFOL N/A 01/28/2020   Procedure: COLONOSCOPY WITH PROPOFOL;  Surgeon: [name], MD;  [facility];  Service: Endoscopy;  Laterality: N/A;  10:45am  . ESOPHAGOGASTRODUODENOSCOPY  09/15/2010   Dr. [name]- Salmon-colored epithelium coming up to 37 cm from the   . ESOPHAGOGASTRODUODENOSCOPY N/A 07/02/2013   Dr. [name]- Barretts on bx. hiatal hernia  . ESOPHAGOGASTRODUODENOSCOPY N/A 09/13/2016   RMR: Barrett's esophagus without dysplasia. one last EGD in 3 years.   . ESOPHAGOGASTRODUODENOSCOPY (EGD) WITH PROPOFOL N/A 01/28/2020   Procedure: ESOPHAGOGASTRODUODENOSCOPY (EGD) WITH PROPOFOL;  Surgeon: [name], MD;  [facility];  Service: Endoscopy;  Laterality: N/A;  . INTRAVASCULAR PRESSURE WIRE/FFR STUDY N/A 07/21/2019   Procedure: INTRAVASCULAR PRESSURE WIRE/FFR STUDY;  Surgeon: [name], MD;  Location: [place] [facility];  Service: Cardiovascular;  Laterality: N/A;  . LEFT HEART CATH AND CORONARY ANGIOGRAPHY N/A 07/21/2019   Procedure: LEFT HEART CATH AND CORONARY ANGIOGRAPHY;  Surgeon: [name], MD;  Location: [place] [facility];  Service: Cardiovascular;  Laterality: N/A;  . POLYPECTOMY  01/28/2020   Procedure: POLYPECTOMY;  Surgeon: [name], MD;  [facility];  Service: Endoscopy;;  colon  . ROTATOR CUFF REPAIR    . YAG LASER APPLICATION Right 4/0/1027   Procedure: YAG LASER APPLICATION;  Surgeon: [name], MD;  Location: [facility];  Service: Ophthalmology;  Laterality: Right;    Home Medications:  Allergies as of 07/01/2020   No Known Allergies        Medication List       Accurate as of July 01, 2020 11:53 AM. If you have any questions, ask your nurse or doctor.        acetaminophen 500 MG tablet Commonly known as: TYLENOL Take 1,000 mg by mouth every 6 (six) hours as needed for moderate pain.   allopurinol  100 MG tablet Commonly known as: ZYLOPRIM Take 100 mg by mouth daily.   ALPRAZolam 1 MG tablet Commonly known as: XANAX Take 0.5 mg by mouth at bedtime as needed for anxiety or sleep.   atorvastatin 40 MG tablet Commonly known as: LIPITOR Take 40 mg by mouth daily.   Coenzyme Q10 100 MG capsule CoQ-10 100 mg capsule  Take by oral route.   Cymbalta 30 MG capsule Generic drug: DULoxetine Cymbalta 30 mg capsule,delayed release  Take 1 capsule every day by oral route.   fexofenadine 180 MG tablet Commonly known as: ALLEGRA  Take 180 mg by mouth daily.   gabapentin 300 MG capsule Commonly known as: NEURONTIN Take 300 mg by mouth 2 (two) times daily.   glipiZIDE 5 MG tablet Commonly known as: GLUCOTROL Take 2.5 mg by mouth daily before breakfast.   HYDROcodone-acetaminophen 7.5-325 MG tablet Commonly known as: NORCO hydrocodone 7.5 mg-acetaminophen 325 mg tablet  Take 1 tablet twice a day by oral route as needed.   metoprolol tartrate 50 MG tablet Commonly known as: LOPRESSOR Take 0.5 tablets (25 mg total) by mouth daily.   pantoprazole 40 MG tablet Commonly known as: PROTONIX Take 1 tablet (40 mg total) by mouth 2 (two) times daily before a meal.   tamsulosin 0.4 MG Caps capsule Commonly known as: FLOMAX Take 1 capsule (0.4 mg total) by mouth 2 (two) times daily.   traMADol 50 MG tablet Commonly known as: ULTRAM Take 50 mg by mouth as needed.   Vitamin D (Ergocalciferol) 1.25 MG (50000 UNIT) Caps capsule Commonly known as: DRISDOL TAKE ONE CAPSULE (50,000 UNITS) BY MOUTH ONCE WEEKLY. ONCE THIS PRESCRIPTION IS COMPLETE, THEN TAKE 2000 UNITS VIT D OVER THE COUNTER ONCE D   warfarin 5 MG tablet Commonly known as: COUMADIN Take as directed by the anticoagulation clinic. If you are unsure how to take this medication, talk to your nurse or doctor. Original instructions: TAKE ONE TABLET BY MOUTH DAILY EXCEPT ONE-HALF TABLET ON WEDNESDAYS OR AS DIRECTED        Allergies: No Known Allergies       Family History  Problem Relation Age of Onset  . Prostate cancer Father        deceased  . Stroke Mother        deceased  . Colon cancer Neg Hx     Social History:  reports that he quit smoking about 26 years ago. His smoking use included cigarettes. He has a 45.00 pack-year smoking history. He has never used smokeless tobacco. He reports that he does not drink alcohol and does not use drugs.  ROS: A complete review of systems was performed.  All systems are negative except for pertinent findings as noted.  Physical Exam:  Vital signs in last 24 hours: BP 120/64   Pulse 70   Temp 98.5 F (36.9 C)   Wt 214 lb (97.1 kg)   BMI 29.85 kg/m  Constitutional:  Alert and oriented, No acute distress, Obese   Laboratory Data:  UA is unremarkable.       Impression/Assessment:  Balanitis-- resolved  BPH with BOO and Nocturia.  He has stable LUTS on tamsulosin.  I think it would be unlikely that the tamsulosin is contributing to his SOB with no signs of fluid overload.    Horseshoe kidney with right renal stone on CT in 2015.  He has chronic left flank and lower quadrant pain.  I will get a CT stone study to reassessment.   Elevated PSA.  Repeat in 6 months.   Chief Complaint: Dysuria   History of Present Illness:   08/18/20:  [name] presents today for a right ureteroscopy for an 8x74mm right distal stone with pain.     07/01/20: [name] returns today in f/u.  He reports resolution of the penile lesion.   He continues to have nocturia but he is getting up a lot with his wife who has to get up.   He remains on tamsulosin.  His IPSS is 24.  He has been having some progressive SOB and Dr. Melvyn Novas had some  questions about whether the tamsulosin may be contributing to the breathing issues.   His PSA in 1/21 was 6.3 which was down from 9.7.  He has some LLQ discomfort at times.  He has a horseshoe kidney and a right renal  stone on a CT in 2015.   3.23.2021: Most recent PSA was 6.3 on 1.5.2021. He presents today c/o of sore spot underneath his penis that he has had for the last 2 wks. This has been too painful to touch. He has tried managing pain with medications and hot baths without success. He has also tried to "pop" it without producing any drainage. This pain has started to lessen in severity but he has recently begun having some mild dysuria and increased nocturia. Otherwise, his urinary sx's are stable.   (below copied from [facility] records):  Elevated PSA:  [name] is a 79 year-old male established patient who is here for an elevated PSA.  His PSA is 6. He has not had a prostate nodule on a physical examination. He has not had recurrent prostate infections or chronic prostatitis.   He has not been on antibiotics for prostate infections previously. He has had a prostate biopsy done. His first prostate biopsy was done approximately 09/19/2010. He does have the pathology report from his biopsy.   He has a history of an elevated PSA with a negative biopsy in 2011. His PSA was 4.87 prior to the biopsy but declined on finasteride to 3.17 but he is off of that and the level has been rising slowly   04/24/19: [name] returns today in f/u for his history of an elevated PSA with a negative biopsy in 2011. His PSA was back down to 6.4 from 9.7 prior to this visit.   He is currently on prednisone for gout.    01/27/19 10/23/18 10/17/17 09/18/16 09/06/15 03/02/15 04/03/14 06/13/12  PSA  Total PSA 6.4 ng/dl 9.7 ng/dl 5.3 ng/dl 5.8  4.66  5.64  5.20  4.36   Free PSA    1.0  0.56  0.58  0.64  0.62   % Free PSA  8 %  17  12  10  12  14     BPH:  He is currently taking tamsulosin. He is not on new medications for symptoms of prostate enlargement.   He does have an abnormal sensation when needing to urinate. He is not having problems getting his urine stream started. He does not have a good size and  strength to his urinary stream. He is not having problems with emptying his bladder well. He does not dribble at the end of urination.   04/24/19: He has stable moderate to severe LUTS with an IPSS of 20 on tamsulosin. he has nocturia x 3 and a reduced stream. He had been on finasteride remotely. He has no associated signs or symptoms.      Past Medical History:  Diagnosis Date  . Barrett's esophagus    short-segment, diagnosed in 2007 by Dr. [name]  . BPH (benign prostatic hyperplasia)   . CAD (coronary artery disease)    a. cath on 07/21/2019 showing patent LM and LCx with 40-60% Proximal LAD and 30-40% Pro-RCA stenosis with normal LV function.  [name] GERD (gastroesophageal reflux disease)   . Hiatal hernia    small  . HTN (hypertension)   . PAF (paroxysmal atrial fibrillation) ([place])   . S/P colonoscopy 2007   unremarkable  . S/P endoscopy Oct 2011    Salmon-colored epithelium  coming up to 37 cm from the   . Vertigo          Past Surgical History:  Procedure Laterality Date  . APPENDECTOMY    . BIOPSY  01/28/2020   Procedure: BIOPSY;  Surgeon: [name], MD;  [facility];  Service: Endoscopy;;  esophagus  . CHOLECYSTECTOMY    . COLONOSCOPY  02/06/2006   Dr. [name]- single anal papilla, o/w normal rectum, normal colon  . COLONOSCOPY N/A 09/13/2016   RMR: three 5-6 mm for adenomas removed.  Recommend one last surveillance colonoscopy in October 2020.  [name] COLONOSCOPY WITH PROPOFOL N/A 01/28/2020   Procedure: COLONOSCOPY WITH PROPOFOL;  Surgeon: [name], MD;  [facility];  Service: Endoscopy;  Laterality: N/A;  10:45am  . ESOPHAGOGASTRODUODENOSCOPY  09/15/2010   Dr. [name]- Salmon-colored epithelium coming up to 37 cm from the   . ESOPHAGOGASTRODUODENOSCOPY N/A 07/02/2013   Dr. [name]- Barretts on bx. hiatal hernia  . ESOPHAGOGASTRODUODENOSCOPY N/A 09/13/2016   RMR: Barrett's esophagus without dysplasia. one last EGD in 3  years.   . ESOPHAGOGASTRODUODENOSCOPY (EGD) WITH PROPOFOL N/A 01/28/2020   Procedure: ESOPHAGOGASTRODUODENOSCOPY (EGD) WITH PROPOFOL;  Surgeon: [name], MD;  [facility];  Service: Endoscopy;  Laterality: N/A;  . INTRAVASCULAR PRESSURE WIRE/FFR STUDY N/A 07/21/2019   Procedure: INTRAVASCULAR PRESSURE WIRE/FFR STUDY;  Surgeon: [name], MD;  Location: [place] [facility];  Service: Cardiovascular;  Laterality: N/A;  . LEFT HEART CATH AND CORONARY ANGIOGRAPHY N/A 07/21/2019   Procedure: LEFT HEART CATH AND CORONARY ANGIOGRAPHY;  Surgeon: [name], MD;  Location: [place] [facility];  Service: Cardiovascular;  Laterality: N/A;  . POLYPECTOMY  01/28/2020   Procedure: POLYPECTOMY;  Surgeon: [name], MD;  [facility];  Service: Endoscopy;;  colon  . ROTATOR CUFF REPAIR    . YAG LASER APPLICATION Right 04/21/7857   Procedure: YAG LASER APPLICATION;  Surgeon: [name], MD;  Location: [facility];  Service: Ophthalmology;  Laterality: Right;    Home Medications:  Allergies as of 07/01/2020   No Known Allergies        Medication List       Accurate as of July 01, 2020 11:53 AM. If you have any questions, ask your nurse or doctor.        acetaminophen 500 MG tablet Commonly known as: TYLENOL Take 1,000 mg by mouth every 6 (six) hours as needed for moderate pain.   allopurinol 100 MG tablet Commonly known as: ZYLOPRIM Take 100 mg by mouth daily.   ALPRAZolam 1 MG tablet Commonly known as: XANAX Take 0.5 mg by mouth at bedtime as needed for anxiety or sleep.   atorvastatin 40 MG tablet Commonly known as: LIPITOR Take 40 mg by mouth daily.   Coenzyme Q10 100 MG capsule CoQ-10 100 mg capsule  Take by oral route.   Cymbalta 30 MG capsule Generic drug: DULoxetine Cymbalta 30 mg capsule,delayed release  Take 1 capsule every day by oral route.   fexofenadine 180 MG tablet Commonly known as: ALLEGRA Take 180 mg by  mouth daily.   gabapentin 300 MG capsule Commonly known as: NEURONTIN Take 300 mg by mouth 2 (two) times daily.   glipiZIDE 5 MG tablet Commonly known as: GLUCOTROL Take 2.5 mg by mouth daily before breakfast.   HYDROcodone-acetaminophen 7.5-325 MG tablet Commonly known as: NORCO hydrocodone 7.5 mg-acetaminophen 325 mg tablet  Take 1 tablet twice a day by oral  route as needed.   metoprolol tartrate 50 MG tablet Commonly known as: LOPRESSOR Take 0.5 tablets (25 mg total) by mouth daily.   pantoprazole 40 MG tablet Commonly known as: PROTONIX Take 1 tablet (40 mg total) by mouth 2 (two) times daily before a meal.   tamsulosin 0.4 MG Caps capsule Commonly known as: FLOMAX Take 1 capsule (0.4 mg total) by mouth 2 (two) times daily.   traMADol 50 MG tablet Commonly known as: ULTRAM Take 50 mg by mouth as needed.   Vitamin D (Ergocalciferol) 1.25 MG (50000 UNIT) Caps capsule Commonly known as: DRISDOL TAKE ONE CAPSULE (50,000 UNITS) BY MOUTH ONCE WEEKLY. ONCE THIS PRESCRIPTION IS COMPLETE, THEN TAKE 2000 UNITS VIT D OVER THE COUNTER ONCE D   warfarin 5 MG tablet Commonly known as: COUMADIN Take as directed by the anticoagulation clinic. If you are unsure how to take this medication, talk to your nurse or doctor. Original instructions: TAKE ONE TABLET BY MOUTH DAILY EXCEPT ONE-HALF TABLET ON WEDNESDAYS OR AS DIRECTED       Allergies: No Known Allergies       Family History  Problem Relation Age of Onset  . Prostate cancer Father        deceased  . Stroke Mother        deceased  . Colon cancer Neg Hx     Social History:  reports that he quit smoking about 26 years ago. His smoking use included cigarettes. He has a 45.00 pack-year smoking history. He has never used smokeless tobacco. He reports that he does not drink alcohol and does not use drugs.  ROS: A complete review of systems was performed.  All systems are negative except for pertinent  findings as noted.  Physical Exam:  Vital signs in last 24 hours: BP 120/64   Pulse 70   Temp 98.5 F (36.9 C)   Wt 214 lb (97.1 kg)   BMI 29.85 kg/m   Constitutional:  Alert and oriented, No acute distress, Obese Lungs: CTA. CV: RRR.   Laboratory Data:  UA is unremarkable.   CT reviewed.   See report.     Impression/Assessment:  Balanitis-- resolved  BPH with BOO and Nocturia.  He has stable LUTS on tamsulosin.  I think it would be unlikely that the tamsulosin is contributing to his SOB with no signs of fluid overload.    Horseshoe kidney with right renal stone on CT in 2015.  He has chronic left flank and lower quadrant pain.  I will get a CT stone study to reassessment.   CT showed an 8x58mm stone at the right UVJ.   He will need Ureteroscopy.

## 2020-08-25 ENCOUNTER — Ambulatory Visit (INDEPENDENT_AMBULATORY_CARE_PROVIDER_SITE_OTHER): Payer: Medicare Other | Admitting: *Deleted

## 2020-08-25 DIAGNOSIS — Z5181 Encounter for therapeutic drug level monitoring: Secondary | ICD-10-CM | POA: Diagnosis not present

## 2020-08-25 DIAGNOSIS — I4891 Unspecified atrial fibrillation: Secondary | ICD-10-CM

## 2020-08-25 LAB — POCT INR: INR: 1.4 — AB (ref 2.0–3.0)

## 2020-08-25 NOTE — Patient Instructions (Signed)
Take warfarin 1 1/2 tablets tonight then increase dose to 1 tablet daily Recheck INR in 1 wk S/P cystoscopy on 9/30.  Was off warfarin x 5 days  Restarted on 9/30 Call us with any new medications, changes or any bleeding concerns @ [phone].

## 2020-08-30 ENCOUNTER — Other Ambulatory Visit: Payer: Self-pay

## 2020-08-30 ENCOUNTER — Encounter: Payer: Self-pay | Admitting: Nurse Practitioner

## 2020-08-30 ENCOUNTER — Ambulatory Visit (INDEPENDENT_AMBULATORY_CARE_PROVIDER_SITE_OTHER): Payer: Medicare Other | Admitting: Nurse Practitioner

## 2020-08-30 VITALS — BP 114/64 | HR 54 | Ht 71.0 in | Wt 209.2 lb

## 2020-08-30 DIAGNOSIS — E1165 Type 2 diabetes mellitus with hyperglycemia: Secondary | ICD-10-CM

## 2020-08-30 MED ORDER — METFORMIN HCL ER 500 MG PO TB24: 500.0000 mg | ORAL_TABLET | Freq: Every day | ORAL | 3 refills | Status: DC

## 2020-08-30 NOTE — Progress Notes (Addendum)
Endocrinology Follow Up Visit      08/30/2020, 2:42 PM   Subjective:    Patient ID: [name], male    DOB: Nov 25, 1940.  [name] is being seen in follow up after being seen in consultation for management of currently uncontrolled symptomatic diabetes requested by  Redmond School, MD.  He is a retired Engineer, structural for [employer].   Past Medical History:  Diagnosis Date  . Aortic atherosclerosis ([place]) 07/29/2020   Incidental CT finding.   . Barrett's esophagus    short-segment, diagnosed in 2007 by Dr. [name]  . BPH (benign prostatic hyperplasia)   . CAD (coronary artery disease)    a. cath on 07/21/2019 showing patent LM and LCx with 40-60% Proximal LAD and 30-40% Pro-RCA stenosis with normal LV function.  . Diabetes mellitus without complication (HCC)    type 1   . Dyspnea    with exertion   . GERD (gastroesophageal reflux disease)   . Hiatal hernia    small  . History of kidney stones   . HTN (hypertension)   . PAF (paroxysmal atrial fibrillation) ([place])   . S/P colonoscopy 2007   unremarkable  . S/P endoscopy Oct 2011    Salmon-colored epithelium coming up to 37 cm from the   . Vertigo     Past Surgical History:  Procedure Laterality Date  . APPENDECTOMY    . BIOPSY  01/28/2020   Procedure: BIOPSY;  Surgeon: [name], MD;  Location: [facility];  Service: Endoscopy;;  esophagus  . CHOLECYSTECTOMY    . COLONOSCOPY  02/06/2006   Dr. [name]- single anal papilla, o/w normal rectum, normal colon  . COLONOSCOPY N/A 09/13/2016   RMR: three 5-6 mm for adenomas removed.  Recommend one last surveillance colonoscopy in October 2020.  [name] COLONOSCOPY WITH PROPOFOL N/A 01/28/2020   Procedure: COLONOSCOPY WITH PROPOFOL;  Surgeon: [name], MD;  [facility]: [facility];  Service: Endoscopy;  Laterality: N/A;  10:45am  . CYSTOSCOPY WITH RETROGRADE PYELOGRAM,  URETEROSCOPY AND STENT PLACEMENT Right 08/18/2020   Procedure: CYSTOSCOPY WITH INCISION OF RIGHT URETEROCELE, URETEROSCOPY WITH HOLMIUM LASER, CYSTOLITHOLAPAXY OF 10 MM BLADDER STONE;  Surgeon: [name], MD;  Location: [facility];  Service: Urology;  Laterality: Right;  . ESOPHAGOGASTRODUODENOSCOPY  09/15/2010   Dr. [name]- Salmon-colored epithelium coming up to 37 cm from the   . ESOPHAGOGASTRODUODENOSCOPY N/A 07/02/2013   Dr. [name]- Barretts on bx. hiatal hernia  . ESOPHAGOGASTRODUODENOSCOPY N/A 09/13/2016   RMR: Barrett's esophagus without dysplasia. one last EGD in 3 years.   . ESOPHAGOGASTRODUODENOSCOPY (EGD) WITH PROPOFOL N/A 01/28/2020   Procedure: ESOPHAGOGASTRODUODENOSCOPY (EGD) WITH PROPOFOL;  Surgeon: [name], MD;  [facility];  Service: Endoscopy;  Laterality: N/A;  . INTRAVASCULAR PRESSURE WIRE/FFR STUDY N/A 07/21/2019   Procedure: INTRAVASCULAR PRESSURE WIRE/FFR STUDY;  Surgeon: [name], MD;  Location: [facility];  Service: Cardiovascular;  Laterality: N/A;  . LEFT HEART CATH AND CORONARY ANGIOGRAPHY N/A 07/21/2019   Procedure: LEFT HEART CATH AND CORONARY ANGIOGRAPHY;  Surgeon: [name], MD;  Location: [facility];  Service: Cardiovascular;  Laterality: N/A;  . POLYPECTOMY  01/28/2020   Procedure: POLYPECTOMY;  Surgeon: [name], MD;  [facility];  Service: Endoscopy;;  colon  . ROTATOR CUFF REPAIR    . YAG LASER APPLICATION Right 0/12/4095   Procedure: YAG LASER APPLICATION;  Surgeon: [name], MD;  Location: [facility];  Service: Ophthalmology;  Laterality: Right;    Social History   Socioeconomic History  . Marital status: Married    Spouse name: Not on file  . Number of children: Not on file  . Years of education: Not on file  . Highest education level: Not on file  Occupational History  . Not on file  Tobacco Use  . Smoking status: Former Smoker    Packs/day: 1.50    Years: 30.00    Pack years: 45.00     Types: Cigarettes    Quit date: 11/18/1993    Years since quitting: 26.8  . Smokeless tobacco: Never Used  . Tobacco comment: quit in the 90's  Vaping Use  . Vaping Use: Never used  Substance and Sexual Activity  . Alcohol use: No  . Drug use: No  . Sexual activity: Not on file  Other Topics Concern  . Not on file  Social History Narrative  . Not on file   Social Determinants of Health   Financial Resource Strain:   . Difficulty of Paying Living Expenses: Not on file  Food Insecurity:   . Worried About Charity fundraiser in the Last Year: Not on file  . Ran Out of Food in the Last Year: Not on file  Transportation Needs:   . Lack of Transportation (Medical): Not on file  . Lack of Transportation (Non-Medical): Not on file  Physical Activity:   . Days of Exercise per Week: Not on file  . Minutes of Exercise per Session: Not on file  Stress:   . Feeling of Stress : Not on file  Social Connections:   . Frequency of Communication with Friends and Family: Not on file  . Frequency of Social Gatherings with Friends and Family: Not on file  . Attends Religious Services: Not on file  . Active Member of Clubs or Organizations: Not on file  . Attends Archivist Meetings: Not on file  . Marital Status: Not on file    Family History  Problem Relation Age of Onset  . Prostate cancer Father        deceased  . Stroke Mother        deceased  . Colon cancer Neg Hx     Outpatient Encounter Medications as of 08/30/2020  Medication Sig  . Accu-Chek FastClix Lancets MISC 1 application by Does not apply route 2 (two) times daily.  [name] acetaminophen (TYLENOL) 500 MG tablet Take 1,000 mg by mouth every 6 (six) hours as needed for moderate pain.   [name] allopurinol (ZYLOPRIM) 100 MG tablet Take 100 mg by mouth daily.   [name] ALPRAZolam (XANAX) 1 MG tablet Take 0.5 mg by mouth at bedtime as needed for anxiety or sleep.   . Cholecalciferol (VITAMIN D3) 50 MCG (2000 UT) TABS Take 2,000  Units by mouth daily.  . fexofenadine (ALLEGRA) 180 MG tablet Take 180 mg by mouth daily.  [name] gabapentin (NEURONTIN) 300 MG capsule Take 300 mg by mouth 2 (two) times daily.   [name] glipiZIDE (GLUCOTROL) 5 MG tablet Take 5 mg by mouth daily before breakfast.   .  glucose blood (ACCU-CHEK GUIDE) test strip Use to monitor blood glucose at least twice daily, before breakfast and before bed  . pantoprazole (PROTONIX) 40 MG tablet Take 1 tablet (40 mg total) by mouth 2 (two) times daily before a meal.  . tamsulosin (FLOMAX) 0.4 MG CAPS capsule Take 1 capsule (0.4 mg total) by mouth 2 (two) times daily.  . traMADol (ULTRAM) 50 MG tablet Take 1 tablet (50 mg total) by mouth every 6 (six) hours as needed for moderate pain (pain.).  [name] warfarin (COUMADIN) 5 MG tablet TAKE ONE TABLET BY MOUTH DAILY EXCEPT ONE-HALF TABLET ON WEDNESDAYS OR AS DIRECTED (Patient taking differently: Take 2.5-5 mg by mouth See admin instructions. Take 1 tablet (5 mg) by mouth daily, except take 0.5 tablet (2.5 mg) by mouth on Wednesdays or as directed)  . metFORMIN (GLUCOPHAGE XR) 500 MG 24 hr tablet Take 1 tablet (500 mg total) by mouth daily with breakfast.   No facility-administered encounter medications on file as of 08/30/2020.    ALLERGIES: No Known Allergies  VACCINATION STATUS: Immunization History  Administered Date(s) Administered  . Influenza-Unspecified 09/19/2013    Diabetes He presents for his initial diabetic visit. He has type 2 diabetes mellitus. The initial diagnosis of diabetes was made 3 years (diagnosed at approximate age of 48) ago. His disease course has been improving. Hypoglycemia symptoms include nervousness/anxiousness, sweats and tremors. Pertinent negatives for hypoglycemia include no confusion, dizziness, headaches or seizures. Associated symptoms include fatigue and foot paresthesias. Pertinent negatives for diabetes include no chest pain, no foot ulcerations, no polydipsia, no polyuria, no visual  change and no weight loss. There are no hypoglycemic complications. Symptoms are improving. Diabetic complications include heart disease and peripheral neuropathy. Risk factors for coronary artery disease include diabetes mellitus, dyslipidemia, hypertension, male sex, obesity and sedentary lifestyle. Current diabetic treatment includes oral agent (monotherapy). He is compliant with treatment most of the time. His weight is fluctuating minimally. He is following a generally unhealthy diet. When asked about meal planning, he reported none. He has not had a previous visit with a dietitian. He rarely participates in exercise. His home blood glucose trend is fluctuating minimally. His overall blood glucose range is 140-180 mg/dl. (He presents today with his meter, no logs (he accidentally left them at home) showing 7 day average of 164, 14 day average of 172.  He denies any episodes of hypoglycemia.) An ACE inhibitor/angiotensin II receptor blocker is not being taken. He does not see a podiatrist.Eye exam is current.    Review of systems  Constitutional: + Minimally fluctuating body weight,  current Body mass index is 29.18 kg/m. , + fatigue (primary caregiver for his wife who has Parkinsons), no subjective hyperthermia, no subjective hypothermia Eyes: no blurry vision, no xerophthalmia ENT: no sore throat, no nodules palpated in throat, no dysphagia/odynophagia, no hoarseness Cardiovascular: no chest pain, no shortness of breath, no palpitations, no leg swelling Respiratory: no cough, no shortness of breath Gastrointestinal: no nausea/vomiting/diarrhea Musculoskeletal: no muscle/joint aches Skin: no rashes, no hyperemia Neurological: no tremors, + numbness/tingling to BLE (feet), no dizziness Psychiatric: no depression, no anxiety   Objective:    BP 114/64 (BP Location: Left Arm)   Pulse (!) 54   Ht 5\' 11"  (1.803 m)   Wt 209 lb 3.2 oz (94.9 kg)   BMI 29.18 kg/m   Wt Readings from Last 3  Encounters:  08/30/20 209 lb 3.2 oz (94.9 kg)  08/23/20 209 lb (94.8 kg)  08/18/20 211 lb 9.6  oz (96 kg)    BP Readings from Last 3 Encounters:  08/30/20 114/64  08/23/20 112/72  08/18/20 125/74    Physical Exam- Limited  Constitutional:  Body mass index is 29.18 kg/m. , not in acute distress, normal state of mind, looks fatigued Eyes:  EOMI, no exophthalmos Neck: Supple Thyroid: No gross goiter, nonpalpable, nontender Cardiovascular: RRR, no murmers, rubs, or gallops, no edema Respiratory: Adequate breathing efforts, no crackles, rales, rhonchi, or wheezing Musculoskeletal: no gross deformities, strength intact in all four extremities, no gross restriction of joint movements Skin:  no rashes, no hyperemia Neurological: no tremor with outstretched hands   POCT ABI Results 08/30/20   Right ABI:  1.44      Left ABI:  1.40  Right leg systolic / diastolic: 161/09 mmHg Left leg systolic / diastolic: 604/54 mmHg  Arm systolic / diastolic: 098/11 mmHG  Detailed report will be scanned into patient chart.  CMP ( most recent) CMP     Component Value Date/Time   NA 141 08/12/2020 1336   K 3.9 08/12/2020 1336   CL 105 08/12/2020 1336   CO2 28 08/12/2020 1336   GLUCOSE 69 (L) 08/12/2020 1336   BUN 13 08/12/2020 1336   CREATININE 0.95 08/12/2020 1336   CREATININE 1.16 07/03/2019 1343   CALCIUM 9.3 08/12/2020 1336   PROT 6.7 03/27/2019 1322   ALBUMIN 4.2 03/27/2019 1322   AST 24 03/27/2019 1322   ALT 20 03/27/2019 1322   ALKPHOS 58 03/27/2019 1322   BILITOT 0.9 03/27/2019 1322   GFRNONAA >60 08/12/2020 1336   GFRAA >60 08/12/2020 1336     Diabetic Labs (most recent): Lab Results  Component Value Date   HGBA1C 7.1 (H) 08/12/2020     Lipid Panel ( most recent) Lipid Panel  No results found for: CHOL, TRIG, HDL, CHOLHDL, VLDL, LDLCALC, LDLDIRECT, LABVLDL    Lab Results  Component Value Date   TSH 3.235 05/02/2018         Assessment & Plan:   1. Type 2  diabetes mellitus with hyperglycemia, without long-term current use of insulin ([place])  He presents today with his meter, no logs (he accidentally left them at home) showing 7 day average of 164, 14 day average of 172.  He denies any episodes of hypoglycemia.  - [name] has currently uncontrolled symptomatic type 2 DM since 79 years of age, with most recent A1c of 7.1 %.    Recent labs reviewed.  - I had a long discussion with him about the progressive nature of diabetes and the pathology behind its complications.  -his diabetes is complicated by CAD, peripheral neuropathy and he remains at a high risk for more acute and chronic complications which include CAD, CVA, CKD, retinopathy, and neuropathy. These are all discussed in detail with him.  - Nutritional counseling repeated at each appointment due to patients tendency to fall back in to old habits.  - The patient admits there is a room for improvement in their diet and drink choices. -  Suggestion is made for the patient to avoid simple carbohydrates from their diet including Cakes, Sweet Desserts / Pastries, Ice Cream, Soda (diet and regular), Sweet Tea, Candies, Chips, Cookies, Sweet Pastries,  Store Bought Juices, Alcohol in Excess of  1-2 drinks a day, Artificial Sweeteners, Coffee Creamer, and "Sugar-free" Products. This will help patient to have stable blood glucose profile and potentially avoid unintended weight gain.   - I encouraged the patient to switch to  unprocessed or minimally processed complex starch and increased protein intake (animal or plant source), fruits, and vegetables.   - Patient is advised to stick to a routine mealtimes to eat 3 meals  a day and avoid unnecessary snacks ( to snack only to correct hypoglycemia).  - I have approached him with the following individualized plan to manage  his diabetes and patient agrees:   - He is advised to continue on Glipizide 5 mg po daily at this time.  He will also benefit  from addition of low-dose Metformin. I discussed and initiated Metformin 500 mg ER po daily with breakfast.  Side effects of this medication were discussed with him. -I encouraged him to continue monitoring blood glucose at least twice daily, before breakfast and before bed, and call the clinic if he gets readings less than 70 or greater than 300 for 3 tests in a row.  - he will be considered for incretin therapy as appropriate next visit.  - Specific targets for  A1c;  LDL, HDL,  and Triglycerides were discussed with the patient.  2) Blood Pressure /Hypertension:  His blood pressure is controlled to target.  He is not currently taking any antihypertensive medications at this time.  He should be considered for low dose ACE/ARB for renal protection if BP is elevated on subsequent visits.  3) Lipids/Hyperlipidemia:  There are no recent lipid panel results to review, nor does he take any medication for high cholesterol.  He reports he has had his cholesterol checked at his PCP.  He will request copy be sent for our records here.   4)  Weight/Diet:  His Body mass index is 67.61 kg/m.  - complicating his diabetes care.   he is a candidate for some weight loss. I discussed with him the fact that loss of 5 - 10% of his  current body weight will have the most impact on his diabetes management.  Exercise, and detailed carbohydrates information provided  -  detailed on discharge instructions.  5) Chronic Care/Health Maintenance: -he is encouraged to initiate and continue to follow up with Ophthalmology, Dentist,  Podiatrist at least yearly or according to recommendations, and advised to stay away from smoking and/or secondhand smoke exposure. I have recommended yearly flu vaccine and pneumonia vaccine at least every 5 years; moderate intensity exercise for up to 150 minutes weekly; and  sleep for at least 7 hours a day.  His POCT ABI shows incompressible Right ABI:  Will plan on repeating in 1 year.  -  he is  advised to maintain close follow up with Redmond School, MD for primary care needs, as well as his other providers for optimal and coordinated care.   - Time spent on this patient care encounter:  35 min, of which > 50% was spent in  counseling and the rest reviewing his blood glucose logs , discussing his hypoglycemia and hyperglycemia episodes, reviewing his current and  previous labs / studies  ( including abstraction from other facilities) and medications  doses and developing a  long term treatment plan and documenting his care.   Please refer to Patient Instructions for Blood Glucose Monitoring and Insulin/Medications Dosing Guide"  in media tab for additional information. Please  also refer to " Patient Self Inventory" in the Media  tab for reviewed elements of pertinent patient history.  [name] participated in the discussions, expressed understanding, and voiced agreement with the above plans.  All questions were answered to his satisfaction. he  is encouraged to contact clinic should he have any questions or concerns prior to his return visit.   Follow up plan: - Return in about 3 months (around 11/30/2020) for Diabetes follow up, Previsit labs, Bring glucometer and logs.  Rayetta Pigg, [facility] [address] [place], [place] [address] Phone: [phone] Fax: [phone]  08/30/2020, 2:41 PM  This note was partially dictated with voice recognition software. Similar sounding words can be transcribed inadequately or may not  be corrected upon review.

## 2020-08-30 NOTE — Patient Instructions (Signed)

## 2020-09-01 NOTE — Progress Notes (Signed)
H&P  Chief Complaint: Dysuria   History of Present Illness:   09/02/20: [name] returns today in f/u from recent right ureteral stone extraction.  The stone was impacted and I had to incise the ureteral meatus to release the stone and then fragment it to get it out of the bladder.   He has no hematuria.   He has back pain but no flank pain .  He is voiding well.  His IPSS is 21.  07/01/20: [name] returns today in f/u.  He reports resolution of the penile lesion.   He continues to have nocturia but he is getting up a lot with his wife who has to get up.   He remains on tamsulosin.  His IPSS is 24.  He has been having some progressive SOB and Dr. Melvyn Novas had some questions about whether the tamsulosin may be contributing to the breathing issues.   His PSA in 1/21 was 6.3 which was down from 9.7.  He has some LLQ discomfort at times.  He has a horseshoe kidney and a right renal stone on a CT in 2015.   3.23.2021: Most recent PSA was 6.3 on 1.5.2021. He presents today c/o of sore spot underneath his penis that he has had for the last 2 wks. This has been too painful to touch. He has tried managing pain with medications and hot baths without success. He has also tried to "pop" it without producing any drainage. This pain has started to lessen in severity but he has recently begun having some mild dysuria and increased nocturia. Otherwise, his urinary sx's are stable.   (Elevated PSA:  [name] is a 79 year-old male established patient who is here for an elevated PSA.  His PSA is 6. He has not had a prostate nodule on a physical examination. He has not had recurrent prostate infections or chronic prostatitis.   He has not been on antibiotics for prostate infections previously. He has had a prostate biopsy done. His first prostate biopsy was done approximately 09/19/2010. He does have the pathology report from his biopsy.   He has a history of an elevated PSA with a negative biopsy in 2011. His PSA was  4.87 prior to the biopsy but declined on finasteride to 3.17 but he is off of that and the level has been rising slowly   04/24/19: [name] returns today in f/u for his history of an elevated PSA with a negative biopsy in 2011. His PSA was back down to 6.4 from 9.7 prior to this visit.   He is currently on prednisone for gout.    01/27/19 10/23/18 10/17/17 09/18/16 09/06/15 03/02/15 04/03/14 06/13/12  PSA  Total PSA 6.4 ng/dl 9.7 ng/dl 5.3 ng/dl 5.8  4.66  5.64  5.20  4.36   Free PSA    1.0  0.56  0.58  0.64  0.62   % Free PSA  8 %  17  12  10  12  14     BPH:  He is currently taking tamsulosin. He is not on new medications for symptoms of prostate enlargement.   He does have an abnormal sensation when needing to urinate. He is not having problems getting his urine stream started. He does not have a good size and strength to his urinary stream. He is not having problems with emptying his bladder well. He does not dribble at the end of urination.   04/24/19: He has stable moderate to severe LUTS with an IPSS of  20 on tamsulosin. he has nocturia x 3 and a reduced stream. He had been on finasteride remotely. He has no associated signs or symptoms.  Past Medical History:  Diagnosis Date  . Aortic atherosclerosis ([place]) 07/29/2020   Incidental CT finding.   . Barrett's esophagus    short-segment, diagnosed in 2007 by Dr. [name]  . BPH (benign prostatic hyperplasia)   . CAD (coronary artery disease)    a. cath on 07/21/2019 showing patent LM and LCx with 40-60% Proximal LAD and 30-40% Pro-RCA stenosis with normal LV function.  . Diabetes mellitus without complication (HCC)    type 1   . Dyspnea    with exertion   . GERD (gastroesophageal reflux disease)   . Hiatal hernia    small  . History of kidney stones   . HTN (hypertension)   . PAF (paroxysmal atrial fibrillation) ([place])   . S/P colonoscopy 2007   unremarkable  . S/P endoscopy Oct 2011    Salmon-colored epithelium coming up to 37 cm  from the   . Vertigo     Past Surgical History:  Procedure Laterality Date  . APPENDECTOMY    . BIOPSY  01/28/2020   Procedure: BIOPSY;  Surgeon: [name], MD;  [facility];  Service: Endoscopy;;  esophagus  . CHOLECYSTECTOMY    . COLONOSCOPY  02/06/2006   Dr. [name]- single anal papilla, o/w normal rectum, normal colon  . COLONOSCOPY N/A 09/13/2016   RMR: three 5-6 mm for adenomas removed.  Recommend one last surveillance colonoscopy in October 2020.  [name] COLONOSCOPY WITH PROPOFOL N/A 01/28/2020   Procedure: COLONOSCOPY WITH PROPOFOL;  Surgeon: [name], MD;  [facility];  Service: Endoscopy;  Laterality: N/A;  10:45am  . CYSTOSCOPY WITH RETROGRADE PYELOGRAM, URETEROSCOPY AND STENT PLACEMENT Right 08/18/2020   Procedure: CYSTOSCOPY WITH INCISION OF RIGHT URETEROCELE, URETEROSCOPY WITH HOLMIUM LASER, CYSTOLITHOLAPAXY OF 10 MM BLADDER STONE;  Surgeon: [name], MD;  [facility]: [facility];  Service: Urology;  Laterality: Right;  . ESOPHAGOGASTRODUODENOSCOPY  09/15/2010   Dr. [name]- Salmon-colored epithelium coming up to 37 cm from the   . ESOPHAGOGASTRODUODENOSCOPY N/A 07/02/2013   Dr. [name]- Barretts on bx. hiatal hernia  . ESOPHAGOGASTRODUODENOSCOPY N/A 09/13/2016   RMR: Barrett's esophagus without dysplasia. one last EGD in 3 years.   . ESOPHAGOGASTRODUODENOSCOPY (EGD) WITH PROPOFOL N/A 01/28/2020   Procedure: ESOPHAGOGASTRODUODENOSCOPY (EGD) WITH PROPOFOL;  Surgeon: [name], MD;  [facility];  Service: Endoscopy;  Laterality: N/A;  . INTRAVASCULAR PRESSURE WIRE/FFR STUDY N/A 07/21/2019   Procedure: INTRAVASCULAR PRESSURE WIRE/FFR STUDY;  Surgeon: [name], MD;  Location: [place] [facility];  Service: Cardiovascular;  Laterality: N/A;  . LEFT HEART CATH AND CORONARY ANGIOGRAPHY N/A 07/21/2019   Procedure: LEFT HEART CATH AND CORONARY ANGIOGRAPHY;  Surgeon: [name], MD;  Location: [place] [facility];  Service: Cardiovascular;   Laterality: N/A;  . POLYPECTOMY  01/28/2020   Procedure: POLYPECTOMY;  Surgeon: [name], MD;  [facility];  Service: Endoscopy;;  colon  . ROTATOR CUFF REPAIR    . YAG LASER APPLICATION Right 12/26/7822   Procedure: YAG LASER APPLICATION;  Surgeon: [name], MD;  [facility];  Service: Ophthalmology;  Laterality: Right;    Home Medications:  Allergies as of 09/02/2020   No Known Allergies     Medication List       Accurate as of September 01, 2020 12:22 PM. If you  have any questions, ask your nurse or doctor.        Accu-Chek FastClix Lancets Misc 1 application by Does not apply route 2 (two) times daily.   Accu-Chek Guide test strip Generic drug: glucose blood Use to monitor blood glucose at least twice daily, before breakfast and before bed   acetaminophen 500 MG tablet Commonly known as: TYLENOL Take 1,000 mg by mouth every 6 (six) hours as needed for moderate pain.   allopurinol 100 MG tablet Commonly known as: ZYLOPRIM Take 100 mg by mouth daily.   ALPRAZolam 1 MG tablet Commonly known as: XANAX Take 0.5 mg by mouth at bedtime as needed for anxiety or sleep.   fexofenadine 180 MG tablet Commonly known as: ALLEGRA Take 180 mg by mouth daily.   gabapentin 300 MG capsule Commonly known as: NEURONTIN Take 300 mg by mouth 2 (two) times daily.   glipiZIDE 5 MG tablet Commonly known as: GLUCOTROL Take 5 mg by mouth daily before breakfast.   metFORMIN 500 MG 24 hr tablet Commonly known as: Glucophage XR Take 1 tablet (500 mg total) by mouth daily with breakfast.   pantoprazole 40 MG tablet Commonly known as: PROTONIX Take 1 tablet (40 mg total) by mouth 2 (two) times daily before a meal.   tamsulosin 0.4 MG Caps capsule Commonly known as: FLOMAX Take 1 capsule (0.4 mg total) by mouth 2 (two) times daily.   traMADol 50 MG tablet Commonly known as: ULTRAM Take 1 tablet (50 mg total) by mouth every 6 (six) hours as needed for  moderate pain (pain.).   Vitamin D3 50 MCG (2000 UT) Tabs Take 2,000 Units by mouth daily.   warfarin 5 MG tablet Commonly known as: COUMADIN Take as directed by the anticoagulation clinic. If you are unsure how to take this medication, talk to your nurse or doctor. Original instructions: TAKE ONE TABLET BY MOUTH DAILY EXCEPT ONE-HALF TABLET ON WEDNESDAYS OR AS DIRECTED What changed:   how much to take  how to take this  when to take this  additional instructions       Allergies: No Known Allergies  Family History  Problem Relation Age of Onset  . Prostate cancer Father        deceased  . Stroke Mother        deceased  . Colon cancer Neg Hx     Social History:  reports that he quit smoking about 26 years ago. His smoking use included cigarettes. He has a 45.00 pack-year smoking history. He has never used smokeless tobacco. He reports that he does not drink alcohol and does not use drugs.  ROS: A complete review of systems was performed.  All systems are negative except for pertinent findings as noted.  Physical Exam:  Vital signs in last 24 hours: There were no vitals taken for this visit. Constitutional:  Alert and oriented, No acute distress, Obese   Laboratory Data:  UA is unremarkable.       Impression/Assessment:  BPH with BOO and Nocturia.  He has stable LUTS on tamsulosin.  I have refilled the med.   Horseshoe kidney with right ureteral stone.  He is doing well s/p stone removal.  I will get a KUB and renal US in a month.   Elevated PSA.  Repeat in February as previously planned.

## 2020-09-02 ENCOUNTER — Encounter: Payer: Self-pay | Admitting: Urology

## 2020-09-02 ENCOUNTER — Other Ambulatory Visit: Payer: Self-pay

## 2020-09-02 ENCOUNTER — Ambulatory Visit (INDEPENDENT_AMBULATORY_CARE_PROVIDER_SITE_OTHER): Payer: Medicare Other | Admitting: Urology

## 2020-09-02 VITALS — BP 134/69 | HR 83 | Temp 98.1°F | Ht 71.0 in | Wt 209.2 lb

## 2020-09-02 DIAGNOSIS — R351 Nocturia: Secondary | ICD-10-CM

## 2020-09-02 DIAGNOSIS — R972 Elevated prostate specific antigen [PSA]: Secondary | ICD-10-CM | POA: Diagnosis not present

## 2020-09-02 DIAGNOSIS — N200 Calculus of kidney: Secondary | ICD-10-CM | POA: Diagnosis not present

## 2020-09-02 DIAGNOSIS — N401 Enlarged prostate with lower urinary tract symptoms: Secondary | ICD-10-CM | POA: Diagnosis not present

## 2020-09-02 DIAGNOSIS — R39198 Other difficulties with micturition: Secondary | ICD-10-CM

## 2020-09-02 DIAGNOSIS — Q631 Lobulated, fused and horseshoe kidney: Secondary | ICD-10-CM | POA: Diagnosis not present

## 2020-09-02 DIAGNOSIS — I2510 Atherosclerotic heart disease of native coronary artery without angina pectoris: Secondary | ICD-10-CM

## 2020-09-02 DIAGNOSIS — N138 Other obstructive and reflux uropathy: Secondary | ICD-10-CM

## 2020-09-02 LAB — URINALYSIS, ROUTINE W REFLEX MICROSCOPIC
Bilirubin, UA: NEGATIVE
Glucose, UA: NEGATIVE
Ketones, UA: NEGATIVE
Leukocytes,UA: NEGATIVE
Nitrite, UA: NEGATIVE
Protein,UA: NEGATIVE
Specific Gravity, UA: >1.03 — ABNORMAL HIGH (ref 1.005–1.030)
Urobilinogen, Ur: 0.2 mg/dL (ref 0.2–1.0)
pH, UA: 5 (ref 5.0–7.5)

## 2020-09-02 LAB — MICROSCOPIC EXAMINATION
Bacteria, UA: NONE SEEN
Epithelial Cells (non renal): NONE SEEN /hpf (ref 0–10)
Renal Epithel, UA: NONE SEEN /hpf

## 2020-09-02 MED ORDER — TAMSULOSIN HCL 0.4 MG PO CAPS: 0.8000 mg | ORAL_CAPSULE | Freq: Two times a day (BID) | ORAL | 3 refills | Status: DC

## 2020-09-02 NOTE — Progress Notes (Signed)
Urological Symptom Review  Patient is experiencing the following symptoms: Frequent urination Hard to postpone urination Get up at night to urinate Stream starts and stops Trouble starting stream Weak stream Erection problems (male only)  Kidney stones   Review of Systems  Gastrointestinal (upper)  : Indigestion/heartburn  Gastrointestinal (lower) : Negative for lower GI symptoms  Constitutional : Fatigue  Skin: Negative for skin symptoms  Eyes: Negative for eye symptoms  Ear/Nose/Throat : Negative for Ear/Nose/Throat symptoms  Hematologic/Lymphatic: Negative for Hematologic/Lymphatic symptoms  Cardiovascular : Negative for cardiovascular symptoms  Respiratory : Shortness of breath  Endocrine: Negative for endocrine symptoms  Musculoskeletal: Back pain  Neurological: Negative for neurological symptoms  Psychologic: Anxiety

## 2020-09-15 DIAGNOSIS — Z0001 Encounter for general adult medical examination with abnormal findings: Secondary | ICD-10-CM | POA: Diagnosis not present

## 2020-09-15 DIAGNOSIS — Z1331 Encounter for screening for depression: Secondary | ICD-10-CM | POA: Diagnosis not present

## 2020-09-15 DIAGNOSIS — E1140 Type 2 diabetes mellitus with diabetic neuropathy, unspecified: Secondary | ICD-10-CM | POA: Diagnosis not present

## 2020-09-15 DIAGNOSIS — F419 Anxiety disorder, unspecified: Secondary | ICD-10-CM | POA: Diagnosis not present

## 2020-09-15 DIAGNOSIS — N400 Enlarged prostate without lower urinary tract symptoms: Secondary | ICD-10-CM | POA: Diagnosis not present

## 2020-09-15 DIAGNOSIS — Z23 Encounter for immunization: Secondary | ICD-10-CM | POA: Diagnosis not present

## 2020-09-15 DIAGNOSIS — Z6824 Body mass index (BMI) 24.0-24.9, adult: Secondary | ICD-10-CM | POA: Diagnosis not present

## 2020-09-15 DIAGNOSIS — I4891 Unspecified atrial fibrillation: Secondary | ICD-10-CM | POA: Diagnosis not present

## 2020-09-15 DIAGNOSIS — I700 Atherosclerosis of aorta: Secondary | ICD-10-CM | POA: Diagnosis not present

## 2020-09-17 DIAGNOSIS — E119 Type 2 diabetes mellitus without complications: Secondary | ICD-10-CM | POA: Diagnosis not present

## 2020-09-17 DIAGNOSIS — I480 Paroxysmal atrial fibrillation: Secondary | ICD-10-CM | POA: Diagnosis not present

## 2020-09-17 DIAGNOSIS — K2270 Barrett's esophagus without dysplasia: Secondary | ICD-10-CM | POA: Diagnosis not present

## 2020-09-17 DIAGNOSIS — E7849 Other hyperlipidemia: Secondary | ICD-10-CM | POA: Diagnosis not present

## 2020-09-21 ENCOUNTER — Telehealth: Payer: Self-pay | Admitting: Nurse Practitioner

## 2020-09-21 DIAGNOSIS — E1165 Type 2 diabetes mellitus with hyperglycemia: Secondary | ICD-10-CM

## 2020-09-21 MED ORDER — GLUCOSE BLOOD VI STRP: 1.0000 | ORAL_STRIP | Freq: Two times a day (BID) | 2 refills | Status: DC

## 2020-09-21 NOTE — Telephone Encounter (Signed)
Pt requesting a call back to discuss how many times he needs to be testing.   [phone]

## 2020-09-21 NOTE — Telephone Encounter (Signed)
Advised pt to test two times daily, once in the morning before breakfast and once at night before bed per pt's last visit note. Understanding voiced per pt. Rx refill for prodigy test strips sent to [facility].

## 2020-09-26 ENCOUNTER — Other Ambulatory Visit: Payer: Self-pay | Admitting: Nurse Practitioner

## 2020-09-26 DIAGNOSIS — E1165 Type 2 diabetes mellitus with hyperglycemia: Secondary | ICD-10-CM

## 2020-09-26 DIAGNOSIS — Z23 Encounter for immunization: Secondary | ICD-10-CM | POA: Diagnosis not present

## 2020-09-26 MED ORDER — GLUCOSE BLOOD VI STRP: 1.0000 | ORAL_STRIP | Freq: Every day | 2 refills | Status: DC

## 2020-09-28 ENCOUNTER — Other Ambulatory Visit: Payer: Self-pay

## 2020-09-28 ENCOUNTER — Ambulatory Visit (INDEPENDENT_AMBULATORY_CARE_PROVIDER_SITE_OTHER): Payer: Medicare Other | Admitting: *Deleted

## 2020-09-28 DIAGNOSIS — Z5181 Encounter for therapeutic drug level monitoring: Secondary | ICD-10-CM | POA: Diagnosis not present

## 2020-09-28 DIAGNOSIS — I4891 Unspecified atrial fibrillation: Secondary | ICD-10-CM | POA: Diagnosis not present

## 2020-09-28 LAB — POCT INR: INR: 2 (ref 2.0–3.0)

## 2020-09-28 NOTE — Patient Instructions (Signed)
Continue warfarin 1 tablet daily Recheck INR in 3 wk Call us with any new medications, changes or any bleeding concerns @ [phone].

## 2020-09-29 ENCOUNTER — Ambulatory Visit (HOSPITAL_COMMUNITY)
Admission: RE | Admit: 2020-09-29 | Discharge: 2020-09-29 | Disposition: A | Discharge status: Home / Self Care | Payer: Medicare Other | Source: Ambulatory Visit | Attending: Urology | Admitting: Urology

## 2020-09-29 DIAGNOSIS — M47814 Spondylosis without myelopathy or radiculopathy, thoracic region: Secondary | ICD-10-CM | POA: Diagnosis not present

## 2020-09-29 DIAGNOSIS — N281 Cyst of kidney, acquired: Secondary | ICD-10-CM | POA: Diagnosis not present

## 2020-09-29 DIAGNOSIS — N200 Calculus of kidney: Secondary | ICD-10-CM | POA: Insufficient documentation

## 2020-09-29 DIAGNOSIS — N2889 Other specified disorders of kidney and ureter: Secondary | ICD-10-CM | POA: Diagnosis not present

## 2020-09-29 DIAGNOSIS — Q631 Lobulated, fused and horseshoe kidney: Secondary | ICD-10-CM | POA: Insufficient documentation

## 2020-09-29 DIAGNOSIS — Z87442 Personal history of urinary calculi: Secondary | ICD-10-CM | POA: Diagnosis not present

## 2020-10-18 DIAGNOSIS — I480 Paroxysmal atrial fibrillation: Secondary | ICD-10-CM | POA: Diagnosis not present

## 2020-10-18 DIAGNOSIS — E119 Type 2 diabetes mellitus without complications: Secondary | ICD-10-CM | POA: Diagnosis not present

## 2020-10-18 DIAGNOSIS — K2270 Barrett's esophagus without dysplasia: Secondary | ICD-10-CM | POA: Diagnosis not present

## 2020-10-18 DIAGNOSIS — E7849 Other hyperlipidemia: Secondary | ICD-10-CM | POA: Diagnosis not present

## 2020-10-24 ENCOUNTER — Ambulatory Visit (INDEPENDENT_AMBULATORY_CARE_PROVIDER_SITE_OTHER): Payer: Medicare Other | Admitting: *Deleted

## 2020-10-24 ENCOUNTER — Other Ambulatory Visit: Payer: Self-pay | Admitting: Neurology

## 2020-10-24 DIAGNOSIS — I4891 Unspecified atrial fibrillation: Secondary | ICD-10-CM | POA: Diagnosis not present

## 2020-10-24 DIAGNOSIS — G894 Chronic pain syndrome: Secondary | ICD-10-CM | POA: Diagnosis not present

## 2020-10-24 DIAGNOSIS — M5459 Other low back pain: Secondary | ICD-10-CM | POA: Diagnosis not present

## 2020-10-24 DIAGNOSIS — Z5181 Encounter for therapeutic drug level monitoring: Secondary | ICD-10-CM

## 2020-10-24 DIAGNOSIS — M130 Polyarthritis, unspecified: Secondary | ICD-10-CM | POA: Diagnosis not present

## 2020-10-24 DIAGNOSIS — E1142 Type 2 diabetes mellitus with diabetic polyneuropathy: Secondary | ICD-10-CM | POA: Diagnosis not present

## 2020-10-24 DIAGNOSIS — M5416 Radiculopathy, lumbar region: Secondary | ICD-10-CM

## 2020-10-24 DIAGNOSIS — Z79891 Long term (current) use of opiate analgesic: Secondary | ICD-10-CM | POA: Diagnosis not present

## 2020-10-24 LAB — POCT INR: INR: 2.4 (ref 2.0–3.0)

## 2020-10-24 NOTE — Patient Instructions (Signed)
Continue warfarin 1 tablet daily Recheck INR in 4 wks Call us with any new medications, changes or any bleeding concerns @ [phone].

## 2020-10-25 ENCOUNTER — Telehealth: Payer: Self-pay | Admitting: *Deleted

## 2020-10-25 ENCOUNTER — Other Ambulatory Visit: Payer: Self-pay | Admitting: Family Medicine

## 2020-10-25 NOTE — Telephone Encounter (Signed)
Patient with diagnosis of afib on warfarin for anticoagulation.    Procedure: lumbar ESI Date of procedure: TBD  CHA2DS2-VASc Score = 5  This indicates a 7.2% annual risk of stroke. The patient's score is based upon: CHF History: 0 HTN History: 1 Diabetes History: 1 Stroke History: 0 Vascular Disease History: 1 Age Score: 2 Gender Score: 0  CrCl 30mL/min Platelet count 143K  Per office protocol, patient can hold warfarin for 5 days prior to procedure. Patient will not need bridging with Lovenox (enoxaparin) around procedure.

## 2020-10-25 NOTE — Telephone Encounter (Signed)
   [facility] Pre-operative Risk Assessment    HEARTCARE STAFF: - Please ensure there is not already an duplicate clearance open for this procedure. - Under Visit Info/Reason for Call, type in Other and utilize the format Clearance MM/DD/YY or Clearance TBD. Do not use dashes or single digits. - If request is for dental extraction, please clarify the # of teeth to be extracted.  Request for surgical clearance:  1. What type of surgery is being performed? Lumbar Epi  2. When is this surgery scheduled? TBD  3. What type of clearance is required (medical clearance vs. Pharmacy clearance to hold med vs. Both)? Pharmacy Clearance  4. Are there any medications that need to be held prior to surgery and how long? Warfarin 4 days prior (requesting INR the evening of or morning of procedure)  5. Practice name and name of physician performing surgery? [facility]  6. What is the office phone number? [phone]   7.   What is the office fax number? [phone]  8.   Anesthesia type (None, local, MAC, general) ?    [name] 10/25/2020, 11:57 AM  _________________________________________________________________   (provider comments below)

## 2020-10-25 NOTE — Telephone Encounter (Signed)
   Primary Cardiologist: No primary care provider on file.  Chart reviewed as part of pre-operative protocol coverage. Given past medical history and time since last visit, based on ACC/AHA guidelines, [name] would be at acceptable risk for the planned procedure without further cardiovascular testing.   OK to hold Warfarin 5 days pre op and resume as soon as safe post op.   The patient was advised that if he develops new symptoms prior to surgery to contact our office to arrange for a follow-up visit, and he verbalized understanding.  I will route this recommendation to the requesting party via Epic fax function and remove from pre-op pool.  Please call with questions.  Kerin Ransom, PA-C 10/25/2020, 4:44 PM

## 2020-10-28 DIAGNOSIS — Z23 Encounter for immunization: Secondary | ICD-10-CM | POA: Diagnosis not present

## 2020-10-31 ENCOUNTER — Telehealth: Payer: Self-pay

## 2020-10-31 NOTE — Telephone Encounter (Signed)
Pt called with left testicular pain. Reports he has history of prostatitis. MD sent message. Pt asking for antibiotic

## 2020-10-31 NOTE — Telephone Encounter (Signed)
Left message to return call 

## 2020-10-31 NOTE — Telephone Encounter (Signed)
He would need to be seen by [place] or his PCP or at least drop off a urine for UA and culture.

## 2020-10-31 NOTE — Telephone Encounter (Signed)
Requested appt with MD. Scheduled with pt.

## 2020-11-02 ENCOUNTER — Inpatient Hospital Stay: Admission: RE | Admit: 2020-11-02 | Payer: Medicare Other | Source: Ambulatory Visit

## 2020-11-02 NOTE — Progress Notes (Signed)
H&P  Chief Complaint: Dysuria   History of Present Illness:   11/04/20: [name] returns today with a 1 week history of LLQ pain and hesitancy with frequent small voids.  He is not sleeping well because of the pain.  He has a history of stones and prostatitis.  He UA has 0-2 RBC's.  He had the Covid booster a week ago and had fever with that.  He has had no nausea.  He has a horseshoe kidney that had no left stones on a CT in 9/21.  He did have diverticular disease.   09/02/20: [name] returns today in f/u from recent right ureteral stone extraction.  The stone was impacted and I had to incise the ureteral meatus to release the stone and then fragment it to get it out of the bladder.   He has no hematuria.   He has back pain but no flank pain .  He is voiding well.  His IPSS is 21.  07/01/20: [name] returns today in f/u.  He reports resolution of the penile lesion.   He continues to have nocturia but he is getting up a lot with his wife who has to get up.   He remains on tamsulosin.  His IPSS is 24.  He has been having some progressive SOB and Dr. Melvyn Novas had some questions about whether the tamsulosin may be contributing to the breathing issues.   His PSA in 1/21 was 6.3 which was down from 9.7.  He has some LLQ discomfort at times.  He has a horseshoe kidney and a right renal stone on a CT in 2015.   3.23.2021: Most recent PSA was 6.3 on 1.5.2021. He presents today c/o of sore spot underneath his penis that he has had for the last 2 wks. This has been too painful to touch. He has tried managing pain with medications and hot baths without success. He has also tried to "pop" it without producing any drainage. This pain has started to lessen in severity but he has recently begun having some mild dysuria and increased nocturia. Otherwise, his urinary sx's are stable.      01/27/19 10/23/18 10/17/17 09/18/16 09/06/15 03/02/15 04/03/14 06/13/12  PSA  Total PSA 6.4 ng/dl 9.7 ng/dl 5.3 ng/dl 5.8  4.66  5.64   5.20  4.36   Free PSA    1.0  0.56  0.58  0.64  0.62   % Free PSA  8 %  17  12  10  12  14       Past Medical History:  Diagnosis Date  . Aortic atherosclerosis ([place]) 07/29/2020   Incidental CT finding.   . Barrett's esophagus    short-segment, diagnosed in 2007 by Dr. [name]  . BPH (benign prostatic hyperplasia)   . CAD (coronary artery disease)    a. cath on 07/21/2019 showing patent LM and LCx with 40-60% Proximal LAD and 30-40% Pro-RCA stenosis with normal LV function.  . Diabetes mellitus without complication (HCC)    type 1   . Dyspnea    with exertion   . GERD (gastroesophageal reflux disease)   . Hiatal hernia    small  . History of kidney stones   . HTN (hypertension)   . PAF (paroxysmal atrial fibrillation) ([place])   . S/P colonoscopy 2007   unremarkable  . S/P endoscopy Oct 2011    Salmon-colored epithelium coming up to 37 cm from the   . Vertigo     Past Surgical History:  Procedure  Laterality Date  . APPENDECTOMY    . BIOPSY  01/28/2020   Procedure: BIOPSY;  Surgeon: [name], MD;  [facility];  Service: Endoscopy;;  esophagus  . CHOLECYSTECTOMY    . COLONOSCOPY  02/06/2006   Dr. [name]- single anal papilla, o/w normal rectum, normal colon  . COLONOSCOPY N/A 09/13/2016   RMR: three 5-6 mm for adenomas removed.  Recommend one last surveillance colonoscopy in October 2020.  [name] COLONOSCOPY WITH PROPOFOL N/A 01/28/2020   Procedure: COLONOSCOPY WITH PROPOFOL;  Surgeon: [name], MD;  [facility];  Service: Endoscopy;  Laterality: N/A;  10:45am  . CYSTOSCOPY WITH RETROGRADE PYELOGRAM, URETEROSCOPY AND STENT PLACEMENT Right 08/18/2020   Procedure: CYSTOSCOPY WITH INCISION OF RIGHT URETEROCELE, URETEROSCOPY WITH HOLMIUM LASER, CYSTOLITHOLAPAXY OF 10 MM BLADDER STONE;  Surgeon: [name], MD;  [facility];  Service: Urology;  Laterality: Right;  . ESOPHAGOGASTRODUODENOSCOPY  09/15/2010   Dr. [name]- Salmon-colored epithelium coming up  to 37 cm from the   . ESOPHAGOGASTRODUODENOSCOPY N/A 07/02/2013   Dr. [name]- Barretts on bx. hiatal hernia  . ESOPHAGOGASTRODUODENOSCOPY N/A 09/13/2016   RMR: Barrett's esophagus without dysplasia. one last EGD in 3 years.   . ESOPHAGOGASTRODUODENOSCOPY (EGD) WITH PROPOFOL N/A 01/28/2020   Procedure: ESOPHAGOGASTRODUODENOSCOPY (EGD) WITH PROPOFOL;  Surgeon: [name], MD;  [facility];  Service: Endoscopy;  Laterality: N/A;  . INTRAVASCULAR PRESSURE WIRE/FFR STUDY N/A 07/21/2019   Procedure: INTRAVASCULAR PRESSURE WIRE/FFR STUDY;  Surgeon: [name], MD;  Location: [place] [facility];  Service: Cardiovascular;  Laterality: N/A;  . LEFT HEART CATH AND CORONARY ANGIOGRAPHY N/A 07/21/2019   Procedure: LEFT HEART CATH AND CORONARY ANGIOGRAPHY;  Surgeon: [name], MD;  Location: [place] [facility];  Service: Cardiovascular;  Laterality: N/A;  . POLYPECTOMY  01/28/2020   Procedure: POLYPECTOMY;  Surgeon: [name], MD;  [facility];  Service: Endoscopy;;  colon  . ROTATOR CUFF REPAIR    . YAG LASER APPLICATION Right 04/21/6947   Procedure: YAG LASER APPLICATION;  Surgeon: [name], MD;  [facility];  Service: Ophthalmology;  Laterality: Right;    Home Medications:  Allergies as of 11/04/2020   No Known Allergies     Medication List       Accurate as of November 02, 2020  1:00 PM. If you have any questions, ask your nurse or doctor.        Accu-Chek FastClix Lancets Misc 1 application by Does not apply route 2 (two) times daily.   acetaminophen 500 MG tablet Commonly known as: TYLENOL Take 1,000 mg by mouth every 6 (six) hours as needed for moderate pain.   allopurinol 100 MG tablet Commonly known as: ZYLOPRIM Take 100 mg by mouth daily.   ALPRAZolam 1 MG tablet Commonly known as: XANAX Take 0.5 mg by mouth at bedtime as needed for anxiety or sleep.   fexofenadine 180 MG tablet Commonly known as: ALLEGRA Take 180 mg by mouth  daily.   gabapentin 300 MG capsule Commonly known as: NEURONTIN Take 300 mg by mouth 2 (two) times daily.   glipiZIDE 5 MG tablet Commonly known as: GLUCOTROL Take 5 mg by mouth daily before breakfast.   glucose blood test strip 1 each by Other route daily before breakfast. Use as instructed   metFORMIN 500 MG 24 hr tablet Commonly known as: Glucophage XR Take 1 tablet (500 mg total) by mouth daily with breakfast.   pantoprazole  40 MG tablet Commonly known as: PROTONIX Take 1 tablet (40 mg total) by mouth 2 (two) times daily before a meal.   tamsulosin 0.4 MG Caps capsule Commonly known as: FLOMAX Take 2 capsules (0.8 mg total) by mouth 2 (two) times daily.   traMADol 50 MG tablet Commonly known as: ULTRAM Take 1 tablet (50 mg total) by mouth every 6 (six) hours as needed for moderate pain (pain.).   Vitamin D3 50 MCG (2000 UT) Tabs Take 2,000 Units by mouth daily.   warfarin 5 MG tablet Commonly known as: COUMADIN Take as directed by the anticoagulation clinic. If you are unsure how to take this medication, talk to your nurse or doctor. Original instructions: TAKE ONE TABLET BY MOUTH DAILY EXCEPT ONE-HALF TABLET ON WEDNESDAYS OR AS DIRECTED What changed:   how much to take  how to take this  when to take this  additional instructions       Allergies: No Known Allergies  Family History  Problem Relation Age of Onset  . Prostate cancer Father        deceased  . Stroke Mother        deceased  . Colon cancer Neg Hx     Social History:  reports that he quit smoking about 26 years ago. His smoking use included cigarettes. He has a 45.00 pack-year smoking history. He has never used smokeless tobacco. He reports that he does not drink alcohol and does not use drugs.  ROS: A complete review of systems was performed.  All systems are negative except for pertinent findings as noted.  Physical Exam:  BP 99/62   Pulse 66   Temp 98.1 F (36.7 C)   Ht 5\' 11"   (1.803 m)   Wt 209 lb (94.8 kg)   BMI 29.15 kg/m  Constitutional:  Alert and oriented, No acute distress but he is somewhat ill appearing and in obvious pain GI: soft, obese with LLQ tenderness without mild guarding. No hernia. GU: normal scrotum, testes, epididymis, penis and meatus.    Laboratory Data:  UA has 0-2 RBC's.       Impression/Assessment:  LLQ pain with possible diverticulitis.  I am going to get him started on doxycycline and schedule a CT.  He was given hydrocodone for the pain.    He will return in 3 weeks.     He is on Warfarin and should get his INR checked Monday since the doxycycline can affect the warfarin metabolism.      CC: Dr. Redmond School.

## 2020-11-04 ENCOUNTER — Other Ambulatory Visit: Payer: Self-pay

## 2020-11-04 ENCOUNTER — Ambulatory Visit (INDEPENDENT_AMBULATORY_CARE_PROVIDER_SITE_OTHER): Payer: Medicare Other | Admitting: Urology

## 2020-11-04 ENCOUNTER — Encounter: Payer: Self-pay | Admitting: Urology

## 2020-11-04 VITALS — BP 99/62 | HR 66 | Temp 98.1°F | Ht 71.0 in | Wt 209.0 lb

## 2020-11-04 DIAGNOSIS — I2510 Atherosclerotic heart disease of native coronary artery without angina pectoris: Secondary | ICD-10-CM | POA: Diagnosis not present

## 2020-11-04 DIAGNOSIS — R39198 Other difficulties with micturition: Secondary | ICD-10-CM | POA: Diagnosis not present

## 2020-11-04 DIAGNOSIS — R1032 Left lower quadrant pain: Secondary | ICD-10-CM | POA: Diagnosis not present

## 2020-11-04 LAB — MICROSCOPIC EXAMINATION
Bacteria, UA: NONE SEEN
Epithelial Cells (non renal): NONE SEEN /hpf (ref 0–10)
Renal Epithel, UA: NONE SEEN /hpf
WBC, UA: NONE SEEN /hpf (ref 0–5)

## 2020-11-04 LAB — URINALYSIS, ROUTINE W REFLEX MICROSCOPIC
Bilirubin, UA: NEGATIVE
Glucose, UA: NEGATIVE
Ketones, UA: NEGATIVE
Leukocytes,UA: NEGATIVE
Nitrite, UA: NEGATIVE
Protein,UA: NEGATIVE
Specific Gravity, UA: 1.015 (ref 1.005–1.030)
Urobilinogen, Ur: 0.2 mg/dL (ref 0.2–1.0)
pH, UA: 5 (ref 5.0–7.5)

## 2020-11-04 MED ORDER — DOXYCYCLINE HYCLATE 100 MG PO CAPS: 100.0000 mg | ORAL_CAPSULE | Freq: Two times a day (BID) | ORAL | 0 refills | Status: DC

## 2020-11-04 MED ORDER — HYDROCODONE-ACETAMINOPHEN 5-325 MG PO TABS: 1.0000 | ORAL_TABLET | Freq: Four times a day (QID) | ORAL | 0 refills | Status: DC | PRN

## 2020-11-04 NOTE — Progress Notes (Signed)
Urological Symptom Review  Patient is experiencing the following symptoms: Frequent urination Get up at night to urinate Leakage of urine Stream starts and stops Trouble starting stream Weak stream Erection problems (male only)   Review of Systems  Gastrointestinal (upper)  : Negative for upper GI symptoms  Gastrointestinal (lower) : Negative for lower GI symptoms  Constitutional : Fatigue  Skin: Negative for skin symptoms  Eyes: Negative for eye symptoms  Ear/Nose/Throat : Negative for Ear/Nose/Throat symptoms  Hematologic/Lymphatic: Easy bruising  Cardiovascular : Negative for cardiovascular symptoms  Respiratory : Negative for respiratory symptoms  Endocrine: Negative for endocrine symptoms  Musculoskeletal: Back pain  Neurological: Negative for neurological symptoms  Psychologic: Anxiety

## 2020-11-05 ENCOUNTER — Other Ambulatory Visit: Payer: Medicare Other

## 2020-11-05 DIAGNOSIS — Z20822 Contact with and (suspected) exposure to covid-19: Secondary | ICD-10-CM | POA: Diagnosis not present

## 2020-11-07 ENCOUNTER — Ambulatory Visit (INDEPENDENT_AMBULATORY_CARE_PROVIDER_SITE_OTHER): Payer: Medicare Other | Admitting: *Deleted

## 2020-11-07 DIAGNOSIS — I4891 Unspecified atrial fibrillation: Secondary | ICD-10-CM | POA: Diagnosis not present

## 2020-11-07 DIAGNOSIS — Z5181 Encounter for therapeutic drug level monitoring: Secondary | ICD-10-CM

## 2020-11-07 LAB — POCT INR: INR: 1.4 — AB (ref 2.0–3.0)

## 2020-11-07 NOTE — Patient Instructions (Signed)
Pending back injection tomorrow.  Has been off warfarin 5 days Restart warfarin tomorrow night after procedure take 1 tablet daily Recheck INR in 2 wks Call us with any new medications, changes or any bleeding concerns @ [phone].

## 2020-11-08 ENCOUNTER — Ambulatory Visit
Admission: RE | Admit: 2020-11-08 | Discharge: 2020-11-08 | Disposition: A | Discharge status: Home / Self Care | Payer: Medicare Other | Source: Ambulatory Visit | Attending: Neurology | Admitting: Neurology

## 2020-11-08 ENCOUNTER — Other Ambulatory Visit: Payer: Self-pay

## 2020-11-08 DIAGNOSIS — M5416 Radiculopathy, lumbar region: Secondary | ICD-10-CM

## 2020-11-08 DIAGNOSIS — M47817 Spondylosis without myelopathy or radiculopathy, lumbosacral region: Secondary | ICD-10-CM | POA: Diagnosis not present

## 2020-11-08 MED ORDER — METHYLPREDNISOLONE ACETATE 40 MG/ML INJ SUSP (RADIOLOG
120.0000 mg | Freq: Once | INTRAMUSCULAR | Status: AC
  Administered 2020-11-08: 14:00:00 120 mg via EPIDURAL

## 2020-11-08 MED ORDER — IOPAMIDOL (ISOVUE-M 200) INJECTION 41%
1.0000 mL | Freq: Once | INTRAMUSCULAR | Status: AC
  Administered 2020-11-08: 14:00:00 1 mL via EPIDURAL

## 2020-11-08 NOTE — Discharge Instructions (Signed)

## 2020-11-09 LAB — NOVEL CORONAVIRUS, NAA: SARS-CoV-2, NAA: NOT DETECTED

## 2020-11-23 ENCOUNTER — Ambulatory Visit (INDEPENDENT_AMBULATORY_CARE_PROVIDER_SITE_OTHER): Payer: Medicare Other | Admitting: *Deleted

## 2020-11-23 DIAGNOSIS — Z5181 Encounter for therapeutic drug level monitoring: Secondary | ICD-10-CM

## 2020-11-23 DIAGNOSIS — I4891 Unspecified atrial fibrillation: Secondary | ICD-10-CM

## 2020-11-23 LAB — POCT INR: INR: 2.6 (ref 2.0–3.0)

## 2020-11-23 NOTE — Patient Instructions (Signed)
Continue warfarin 1 tablet daily Recheck INR in 4 wks Call us with any new medications, changes or any bleeding concerns @ [phone].  

## 2020-11-24 ENCOUNTER — Telehealth: Payer: Self-pay | Admitting: Nurse Practitioner

## 2020-11-24 NOTE — Telephone Encounter (Signed)
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 She said he is on glipizide and metformin and would like to talk with Whitney about reducing or stopping one of these. She can be reached at [phone]

## 2020-11-24 NOTE — Telephone Encounter (Signed)
Please call.

## 2020-11-25 ENCOUNTER — Telehealth (INDEPENDENT_AMBULATORY_CARE_PROVIDER_SITE_OTHER): Payer: Medicare Other | Admitting: Nurse Practitioner

## 2020-11-25 ENCOUNTER — Other Ambulatory Visit: Payer: Self-pay

## 2020-11-25 ENCOUNTER — Encounter: Payer: Self-pay | Admitting: Nurse Practitioner

## 2020-11-25 DIAGNOSIS — E1165 Type 2 diabetes mellitus with hyperglycemia: Secondary | ICD-10-CM

## 2020-11-25 NOTE — Telephone Encounter (Signed)
He has an appt with me 1/19.  Can we schedule a phone visit for him sooner to discuss?

## 2020-11-25 NOTE — Progress Notes (Signed)
Endocrinology Follow Up Visit      11/25/2020, 9:03 AM    TELEHEALTH VISIT: The patient is being engaged in telehealth visit due to COVID-19.  This type of visit limits physical examination significantly, and thus is not preferable over face-to-face encounters.  I connected with  [name] on 11/25/20 by a video enabled telemedicine application and verified that I am speaking with the correct person using two identifiers.   I discussed the limitations of evaluation and management by telemedicine. The patient expressed understanding and agreed to proceed.    The participants involved in this visit include: Brita Romp, NP located at [facility] and [name]  located at their personal residence listed.   Subjective:    Patient ID: [name], male    DOB: [birth year].  [name] is being seen in follow up after being seen in consultation for management of currently uncontrolled symptomatic diabetes requested by  Redmond School, MD.  He is a retired Engineer, structural for [employer].   Past Medical History:  Diagnosis Date  . Aortic atherosclerosis ([place]) 07/29/2020   Incidental CT finding.   . Barrett's esophagus    short-segment, diagnosed in 2007 by Dr. [name]  . BPH (benign prostatic hyperplasia)   . CAD (coronary artery disease)    a. cath on 07/21/2019 showing patent LM and LCx with 40-60% Proximal LAD and 30-40% Pro-RCA stenosis with normal LV function.  . Diabetes mellitus without complication (HCC)    type 1   . Dyspnea    with exertion   . GERD (gastroesophageal reflux disease)   . Hiatal hernia    small  . History of kidney stones   . HTN (hypertension)   . PAF (paroxysmal atrial fibrillation) ([place])   . S/P colonoscopy 2007   unremarkable  . S/P endoscopy Oct 2011    Salmon-colored epithelium coming up to 37 cm from the   .  Vertigo     Past Surgical History:  Procedure Laterality Date  . APPENDECTOMY    . BIOPSY  01/28/2020   Procedure: BIOPSY;  Surgeon: [name], MD;  [facility];  Service: Endoscopy;;  esophagus  . CHOLECYSTECTOMY    . COLONOSCOPY  02/06/2006   Dr. [name]- single anal papilla, o/w normal rectum, normal colon  . COLONOSCOPY N/A 09/13/2016   RMR: three 5-6 mm for adenomas removed.  Recommend one last surveillance colonoscopy in October 2020.  [name] COLONOSCOPY WITH PROPOFOL N/A 01/28/2020   Procedure: COLONOSCOPY WITH PROPOFOL;  Surgeon: [name], MD;  [facility];  Service: Endoscopy;  Laterality: N/A;  10:45am  . CYSTOSCOPY WITH RETROGRADE PYELOGRAM, URETEROSCOPY AND STENT PLACEMENT Right 08/18/2020   Procedure: CYSTOSCOPY WITH INCISION OF RIGHT URETEROCELE, URETEROSCOPY WITH HOLMIUM LASER, CYSTOLITHOLAPAXY OF 10 MM BLADDER STONE;  Surgeon: [name], MD;  Location: [facility];  Service: Urology;  Laterality: Right;  . ESOPHAGOGASTRODUODENOSCOPY  09/15/2010   Dr. [name]- Salmon-colored epithelium coming up to 37 cm from the   . ESOPHAGOGASTRODUODENOSCOPY N/A 07/02/2013   Dr. [name]- Barretts on bx. hiatal hernia  .  ESOPHAGOGASTRODUODENOSCOPY N/A 09/13/2016   RMR: Barrett's esophagus without dysplasia. one last EGD in 3 years.   . ESOPHAGOGASTRODUODENOSCOPY (EGD) WITH PROPOFOL N/A 01/28/2020   Procedure: ESOPHAGOGASTRODUODENOSCOPY (EGD) WITH PROPOFOL;  Surgeon: [name], MD;  [facility];  Service: Endoscopy;  Laterality: N/A;  . INTRAVASCULAR PRESSURE WIRE/FFR STUDY N/A 07/21/2019   Procedure: INTRAVASCULAR PRESSURE WIRE/FFR STUDY;  Surgeon: [name], MD;  Location: [place] [facility];  Service: Cardiovascular;  Laterality: N/A;  . LEFT HEART CATH AND CORONARY ANGIOGRAPHY N/A 07/21/2019   Procedure: LEFT HEART CATH AND CORONARY ANGIOGRAPHY;  Surgeon: [name], MD;  Location: [place] [facility];  Service: Cardiovascular;  Laterality: N/A;   . POLYPECTOMY  01/28/2020   Procedure: POLYPECTOMY;  Surgeon: [name], MD;  [facility];  Service: Endoscopy;;  colon  . ROTATOR CUFF REPAIR    . YAG LASER APPLICATION Right 01/19/1223   Procedure: YAG LASER APPLICATION;  Surgeon: [name], MD;  Location: [facility];  Service: Ophthalmology;  Laterality: Right;    Social History   Socioeconomic History  . Marital status: Married    Spouse name: Not on file  . Number of children: Not on file  . Years of education: Not on file  . Highest education level: Not on file  Occupational History  . Not on file  Tobacco Use  . Smoking status: Former Smoker    Packs/day: 1.50    Years: 30.00    Pack years: 45.00    Types: Cigarettes    Quit date: 11/18/1993    Years since quitting: 27.0  . Smokeless tobacco: Never Used  . Tobacco comment: quit in the 90's  Vaping Use  . Vaping Use: Never used  Substance and Sexual Activity  . Alcohol use: No  . Drug use: No  . Sexual activity: Not on file  Other Topics Concern  . Not on file  Social History Narrative  . Not on file   Social Determinants of Health   Financial Resource Strain: Not on file  Food Insecurity: Not on file  Transportation Needs: Not on file  Physical Activity: Not on file  Stress: Not on file  Social Connections: Not on file    Family History  Problem Relation Age of Onset  . Prostate cancer Father        deceased  . Stroke Mother        deceased  . Colon cancer Neg Hx     Outpatient Encounter Medications as of 11/25/2020  Medication Sig  . acetaminophen (TYLENOL) 500 MG tablet Take 1,000 mg by mouth every 6 (six) hours as needed for moderate pain.   [name] allopurinol (ZYLOPRIM) 100 MG tablet Take 100 mg by mouth daily.   [name] ALPRAZolam (XANAX) 1 MG tablet Take 0.5 mg by mouth at bedtime as needed for anxiety or sleep.   . Cholecalciferol (VITAMIN D3) 50 MCG (2000 UT) TABS Take 2,000 Units by mouth daily.  . DULoxetine (CYMBALTA) 60 MG  capsule Take 60 mg by mouth daily.  . fexofenadine (ALLEGRA) 180 MG tablet Take 180 mg by mouth daily.  [name] gabapentin (NEURONTIN) 300 MG capsule Take 300 mg by mouth 2 (two) times daily.   [name] glucose blood test strip 1 each by Other route daily before breakfast. Use as instructed  . HYDROcodone-acetaminophen (NORCO) 5-325 MG tablet Take 1 tablet by mouth every 6 (six) hours as needed for moderate pain.  . metFORMIN (GLUCOPHAGE XR) 500  MG 24 hr tablet Take 1 tablet (500 mg total) by mouth daily with breakfast.  . pantoprazole (PROTONIX) 40 MG tablet Take 1 tablet (40 mg total) by mouth 2 (two) times daily before a meal.  . tamsulosin (FLOMAX) 0.4 MG CAPS capsule Take 2 capsules (0.8 mg total) by mouth 2 (two) times daily.  . traMADol (ULTRAM) 50 MG tablet Take 1 tablet (50 mg total) by mouth every 6 (six) hours as needed for moderate pain (pain.).  [name] warfarin (COUMADIN) 5 MG tablet TAKE ONE TABLET BY MOUTH DAILY EXCEPT ONE-HALF TABLET ON WEDNESDAYS OR AS DIRECTED (Patient taking differently: Take 2.5-5 mg by mouth See admin instructions. Take 1 tablet (5 mg) by mouth daily, except take 0.5 tablet (2.5 mg) by mouth on Wednesdays or as directed)  . [DISCONTINUED] glipiZIDE (GLUCOTROL) 5 MG tablet Take 5 mg by mouth daily before breakfast.   . Accu-Chek FastClix Lancets MISC 1 application by Does not apply route 2 (two) times daily.  . [DISCONTINUED] doxycycline (VIBRAMYCIN) 100 MG capsule Take 1 capsule (100 mg total) by mouth every 12 (twelve) hours.   No facility-administered encounter medications on file as of 11/25/2020.    ALLERGIES: No Known Allergies  VACCINATION STATUS: Immunization History  Administered Date(s) Administered  . Influenza-Unspecified 09/19/2013    Diabetes He presents for his initial diabetic visit. He has type 2 diabetes mellitus. The initial diagnosis of diabetes was made 3 years (diagnosed at approximate age of 8) ago. His disease course has been improving.  Hypoglycemia symptoms include dizziness, nervousness/anxiousness, sweats and tremors. Pertinent negatives for hypoglycemia include no confusion, headaches or seizures. Associated symptoms include fatigue and foot paresthesias. Pertinent negatives for diabetes include no chest pain, no foot ulcerations, no polydipsia, no polyuria, no visual change and no weight loss. There are no hypoglycemic complications. Symptoms are improving. Diabetic complications include heart disease and peripheral neuropathy. Risk factors for coronary artery disease include diabetes mellitus, dyslipidemia, hypertension, male sex, obesity and sedentary lifestyle. Current diabetic treatment includes oral agent (monotherapy) and oral agent (dual therapy). He is compliant with treatment most of the time. His weight is fluctuating minimally. He is following a generally unhealthy diet. When asked about meal planning, he reported none. He has not had a previous visit with a dietitian. He rarely participates in exercise. His home blood glucose trend is fluctuating minimally. His breakfast blood glucose range is generally 110-130 mg/dl. His overall blood glucose range is 180-200 mg/dl. (He presents for his virtual visit today with his logs showing near target fasting and postprandial glycemic profile.  He spoke with his pharmacist recently about intermittent dizziness/lightheadedness that seems to come out of no where.  He says he has not done well monitoring his glucose for the month of December, but has checked it more often since his "episodes" began.  His AM glucose readings range between 115-135 and PM readings average between 191-219.  He said he checked his glucose during one of the episodes and says it dropped to either 61 or 81 and he felt better after eating something.  He reports he does not typically eat on a routine and will have frequently snack in the afternoon.) An ACE inhibitor/angiotensin II receptor blocker is not being taken. He  does not see a podiatrist.Eye exam is current.    Review of systems  Constitutional: + Minimally fluctuating body weight,  current There is no height or weight on file to calculate BMI. , + fatigue (primary caregiver for his wife who has  Parkinsons), no subjective hyperthermia, no subjective hypothermia Eyes: no blurry vision, no xerophthalmia ENT: no sore throat, no nodules palpated in throat, no dysphagia/odynophagia, no hoarseness Cardiovascular: no chest pain, no shortness of breath, no palpitations, no leg swelling Respiratory: no cough, no shortness of breath Gastrointestinal: no nausea/vomiting/diarrhea Musculoskeletal: no muscle/joint aches Skin: no rashes, no hyperemia Neurological: no tremors, + numbness/tingling to BLE (feet), + intermittent dizziness/ lightheadedness Psychiatric: no depression, no anxiety   Objective:    There were no vitals taken for this visit.  Wt Readings from Last 3 Encounters:  11/04/20 209 lb (94.8 kg)  09/02/20 209 lb 3.2 oz (94.9 kg)  08/30/20 209 lb 3.2 oz (94.9 kg)    BP Readings from Last 3 Encounters:  11/08/20 122/75  11/04/20 99/62  09/02/20 134/69    Physical Exam- Telehealth- significantly limited due to nature of visit  Constitutional: There is no height or weight on file to calculate BMI. , not in acute distress, normal state of mind Respiratory: Adequate breathing efforts    CMP ( most recent) CMP     Component Value Date/Time   NA 141 08/12/2020 1336   K 3.9 08/12/2020 1336   CL 105 08/12/2020 1336   CO2 28 08/12/2020 1336   GLUCOSE 69 (L) 08/12/2020 1336   BUN 13 08/12/2020 1336   CREATININE 0.95 08/12/2020 1336   CREATININE 1.16 07/03/2019 1343   CALCIUM 9.3 08/12/2020 1336   PROT 6.7 03/27/2019 1322   ALBUMIN 4.2 03/27/2019 1322   AST 24 03/27/2019 1322   ALT 20 03/27/2019 1322   ALKPHOS 58 03/27/2019 1322   BILITOT 0.9 03/27/2019 1322   GFRNONAA >60 08/12/2020 1336   GFRAA >60 08/12/2020 1336      Diabetic Labs (most recent): Lab Results  Component Value Date   HGBA1C 7.1 (H) 08/12/2020     Lipid Panel ( most recent) Lipid Panel  No results found for: CHOL, TRIG, HDL, CHOLHDL, VLDL, LDLCALC, LDLDIRECT, LABVLDL    Lab Results  Component Value Date   TSH 3.235 05/02/2018         Assessment & Plan:   1) Type 2 diabetes mellitus with hyperglycemia, without long-term current use of insulin ([place])  He presents for his virtual visit today with his logs showing near target fasting and postprandial glycemic profile.  His A1c was not checked prior to today's visit.  He spoke with his pharmacist recently about intermittent dizziness/lightheadedness that seems to come out of no where.  He says he has not done well monitoring his glucose for the month of December, but has checked it more often since his "episodes" began.  His AM glucose readings range between 115-135 and PM readings average between 191-219.  He said he checked his glucose during one of the episodes and says it dropped to either 61 or 81 and he felt better after eating something.  He reports he does not typically eat on a routine and will have frequently snack in the afternoon.  - DEMETRIES ARNAU has currently uncontrolled symptomatic type 2 DM since 80 years of age, with most recent A1c of 7.1 %.    Recent labs reviewed.  - I had a long discussion with him about the progressive nature of diabetes and the pathology behind its complications.  -his diabetes is complicated by CAD, peripheral neuropathy and he remains at a high risk for more acute and chronic complications which include CAD, CVA, CKD, retinopathy, and neuropathy. These are all discussed in detail  with him.  - Nutritional counseling repeated at each appointment due to patients tendency to fall back in to old habits.  - The patient admits there is a room for improvement in their diet and drink choices. -  Suggestion is made for the patient to avoid simple  carbohydrates from their diet including Cakes, Sweet Desserts / Pastries, Ice Cream, Soda (diet and regular), Sweet Tea, Candies, Chips, Cookies, Sweet Pastries,  Store Bought Juices, Alcohol in Excess of  1-2 drinks a day, Artificial Sweeteners, Coffee Creamer, and "Sugar-free" Products. This will help patient to have stable blood glucose profile and potentially avoid unintended weight gain.   - I encouraged the patient to switch to  unprocessed or minimally processed complex starch and increased protein intake (animal or plant source), fruits, and vegetables.   - Patient is advised to stick to a routine mealtimes to eat 3 meals  a day and avoid unnecessary snacks ( to snack only to correct hypoglycemia).  - I have approached him with the following individualized plan to manage  his diabetes and patient agrees:   -Given his account of intermittent random hypoglycemia, I recommended he stop his Glipizide for now.   He is advised to continue Metformin 500 mg ER daily with breakfast.   -I encouraged him to continue monitoring blood glucose at least once daily, before breakfast, and call the clinic if he gets readings less than 70 or greater than 200 for 3 tests in a row.  If glucose is stable after discontinuation of Glipizide, he may take a break from routine monitoring.  - he will be considered for incretin therapy as appropriate next visit.  - Specific targets for  A1c;  LDL, HDL,  and Triglycerides were discussed with the patient.  2) Blood Pressure /Hypertension:  His blood pressure is controlled to target according to previous visit readings (perhaps a little on the low side).  Hypotension can be a contributing factor to his intermittent lightheadedness, however he does not have a way of monitoring his BP at home.  If symptoms continue despite the discontinuation of Glipizide, he may need to schedule follow up with PCP to discuss this.    3) Lipids/Hyperlipidemia:  There are no recent lipid  panel results to review, nor does he take any medication for high cholesterol.  He reports he has had his cholesterol checked at his PCP.  He will request copy be sent for our records here.   4)  Weight/Diet:  His There is no height or weight on file to calculate BMI.  - complicating his diabetes care.   he is a candidate for some weight loss. I discussed with him the fact that loss of 5 - 10% of his  current body weight will have the most impact on his diabetes management.  Exercise, and detailed carbohydrates information provided  -  detailed on discharge instructions.  5) Chronic Care/Health Maintenance: -he is encouraged to initiate and continue to follow up with Ophthalmology, Dentist,  Podiatrist at least yearly or according to recommendations, and advised to stay away from smoking and/or secondhand smoke exposure. I have recommended yearly flu vaccine and pneumonia vaccine at least every 5 years; moderate intensity exercise for up to 150 minutes weekly; and  sleep for at least 7 hours a day.  - he is advised to maintain close follow up with Redmond School, MD for primary care needs, as well as his other providers for optimal and coordinated care.    I spent  20 minutes dedicated to the care of this patient on the date of this encounter to include pre-visit review of records, face-to-face time with the patient, and post visit ordering of  testing.    Please refer to Patient Instructions for Blood Glucose Monitoring and Insulin/Medications Dosing Guide"  in media tab for additional information. Please  also refer to " Patient Self Inventory" in the Media  tab for reviewed elements of pertinent patient history.  [name] participated in the discussions, expressed understanding, and voiced agreement with the above plans.  All questions were answered to his satisfaction. he is encouraged to contact clinic should he have any questions or concerns prior to his return visit.   Follow up  plan: - Return in about 3 months (around 02/23/2021) for Diabetes follow up with A1c in office, No previsit labs.  Rayetta Pigg, [facility] [address] [place], [place] [address] Phone: [phone] Fax: [phone]  11/25/2020, 9:03 AM

## 2020-11-25 NOTE — Patient Instructions (Addendum)

## 2020-11-30 ENCOUNTER — Other Ambulatory Visit: Payer: Self-pay | Admitting: Urology

## 2020-11-30 DIAGNOSIS — R972 Elevated prostate specific antigen [PSA]: Secondary | ICD-10-CM

## 2020-12-06 ENCOUNTER — Ambulatory Visit: Payer: Medicare Other | Admitting: Urology

## 2020-12-06 ENCOUNTER — Ambulatory Visit (HOSPITAL_COMMUNITY): Payer: Medicare Other

## 2020-12-07 ENCOUNTER — Ambulatory Visit: Payer: Medicare Other | Admitting: Nurse Practitioner

## 2020-12-17 DIAGNOSIS — K2270 Barrett's esophagus without dysplasia: Secondary | ICD-10-CM | POA: Diagnosis not present

## 2020-12-17 DIAGNOSIS — I480 Paroxysmal atrial fibrillation: Secondary | ICD-10-CM | POA: Diagnosis not present

## 2020-12-17 DIAGNOSIS — E119 Type 2 diabetes mellitus without complications: Secondary | ICD-10-CM | POA: Diagnosis not present

## 2020-12-17 DIAGNOSIS — E7849 Other hyperlipidemia: Secondary | ICD-10-CM | POA: Diagnosis not present

## 2020-12-21 ENCOUNTER — Ambulatory Visit (INDEPENDENT_AMBULATORY_CARE_PROVIDER_SITE_OTHER): Payer: Medicare Other | Admitting: *Deleted

## 2020-12-21 DIAGNOSIS — I4891 Unspecified atrial fibrillation: Secondary | ICD-10-CM

## 2020-12-21 DIAGNOSIS — Z5181 Encounter for therapeutic drug level monitoring: Secondary | ICD-10-CM | POA: Diagnosis not present

## 2020-12-21 LAB — POCT INR: INR: 1.8 — AB (ref 2.0–3.0)

## 2020-12-21 NOTE — Patient Instructions (Signed)
Take warfarin 1 1/2 tablets tonight and tomorrow night then resume 1 tablet daily Recheck INR in 3 wks Call us with any new medications, changes or any bleeding concerns @ [phone].

## 2020-12-26 ENCOUNTER — Ambulatory Visit (HOSPITAL_COMMUNITY)
Admission: RE | Admit: 2020-12-26 | Discharge: 2020-12-26 | Disposition: A | Discharge status: Home / Self Care | Payer: Medicare Other | Source: Ambulatory Visit | Attending: Urology | Admitting: Urology

## 2020-12-26 ENCOUNTER — Other Ambulatory Visit: Payer: Self-pay

## 2020-12-26 DIAGNOSIS — R1032 Left lower quadrant pain: Secondary | ICD-10-CM | POA: Insufficient documentation

## 2020-12-26 LAB — POCT I-STAT CREATININE: Creatinine, Ser: 1.1 mg/dL (ref 0.61–1.24)

## 2020-12-26 MED ORDER — IOHEXOL 300 MG/ML  SOLN
100.0000 mL | Freq: Once | INTRAMUSCULAR | Status: AC | PRN
  Administered 2020-12-26: 100 mL via INTRAVENOUS

## 2020-12-28 NOTE — Progress Notes (Signed)
No acute findings are noted.   No diverticulitis and no obstructing stones.

## 2020-12-29 ENCOUNTER — Telehealth: Payer: Self-pay

## 2020-12-29 ENCOUNTER — Ambulatory Visit: Payer: Medicare Other | Admitting: Urology

## 2020-12-29 NOTE — Telephone Encounter (Signed)
Pt called with no answer. Message left to call office. Letter sent via mail.

## 2020-12-29 NOTE — Progress Notes (Signed)
Letter sent via mail 

## 2020-12-29 NOTE — Telephone Encounter (Signed)
-----   Message from [name], MD sent at 12/28/2020  7:35 AM EST ----- No acute findings are noted.   No diverticulitis and no obstructing stones.

## 2020-12-30 ENCOUNTER — Ambulatory Visit: Payer: Medicare Other | Admitting: Urology

## 2021-01-02 ENCOUNTER — Other Ambulatory Visit: Payer: Self-pay | Admitting: Gastroenterology

## 2021-01-11 ENCOUNTER — Ambulatory Visit (INDEPENDENT_AMBULATORY_CARE_PROVIDER_SITE_OTHER): Payer: Medicare Other | Admitting: *Deleted

## 2021-01-11 DIAGNOSIS — I209 Angina pectoris, unspecified: Secondary | ICD-10-CM | POA: Diagnosis not present

## 2021-01-11 DIAGNOSIS — Z5181 Encounter for therapeutic drug level monitoring: Secondary | ICD-10-CM | POA: Diagnosis not present

## 2021-01-11 DIAGNOSIS — I4891 Unspecified atrial fibrillation: Secondary | ICD-10-CM

## 2021-01-11 LAB — POCT INR: INR: 1.8 — AB (ref 2.0–3.0)

## 2021-01-11 NOTE — Patient Instructions (Signed)
Increase warfarin to 1 tablet daily except 1 1/2 tablets on Wednesdays and Saturdays Recheck INR in 3 wks Call us with any new medications, changes or any bleeding concerns @ [phone].

## 2021-01-12 ENCOUNTER — Encounter: Payer: Self-pay | Admitting: Internal Medicine

## 2021-01-16 DIAGNOSIS — E7849 Other hyperlipidemia: Secondary | ICD-10-CM | POA: Diagnosis not present

## 2021-01-16 DIAGNOSIS — K2270 Barrett's esophagus without dysplasia: Secondary | ICD-10-CM | POA: Diagnosis not present

## 2021-01-16 DIAGNOSIS — I480 Paroxysmal atrial fibrillation: Secondary | ICD-10-CM | POA: Diagnosis not present

## 2021-01-16 DIAGNOSIS — E119 Type 2 diabetes mellitus without complications: Secondary | ICD-10-CM | POA: Diagnosis not present

## 2021-01-25 DIAGNOSIS — Z79891 Long term (current) use of opiate analgesic: Secondary | ICD-10-CM | POA: Diagnosis not present

## 2021-01-25 DIAGNOSIS — M5459 Other low back pain: Secondary | ICD-10-CM | POA: Diagnosis not present

## 2021-01-25 DIAGNOSIS — M1388 Other specified arthritis, other site: Secondary | ICD-10-CM | POA: Diagnosis not present

## 2021-02-01 NOTE — Progress Notes (Signed)
H&P  Chief Complaint: Abdominal pain.   History of Present Illness:   02/02/21: Damond returns today in f/u.  He was given doxycyline on 11/04/20 for possible diverticulitis.   He had the CT I ordered on 12/26/20 and it showed diverticular disease without diverticulitis.  He has a horseshoe kidney with no residual stone disease or obstruction.  He has fairly severe LUTS with an IPSS of 30.  His urgency, frequency and intermittency.  He has a reduced stream at night and nocturia x 3.  He remains on tamsulosin.  He feels generally poorly today but has no fever.  He has no hematuria or dysuria.   12/17/21Orpah Greek returns today with a 1 week history of LLQ pain and hesitancy with frequent small voids.  He is not sleeping well because of the pain.  He has a history of stones and prostatitis.  He UA has 0-2 RBC's.  He had the Covid booster a week ago and had fever with that.  He has had no nausea.  He has a horseshoe kidney that had no left stones on a CT in 9/21.  He did have diverticular disease.   09/02/20: Kerri returns today in f/u from recent right ureteral stone extraction.  The stone was impacted and I had to incise the ureteral meatus to release the stone and then fragment it to get it out of the bladder.   He has no hematuria.   He has back pain but no flank pain .  He is voiding well.  His IPSS is 21.  07/01/20: Fidel returns today in f/u.  He reports resolution of the penile lesion.   He continues to have nocturia but he is getting up a lot with his wife who has to get up.   He remains on tamsulosin.  His IPSS is 24.  He has been having some progressive SOB and Dr. Melvyn Novas had some questions about whether the tamsulosin may be contributing to the breathing issues.   His PSA in 1/21 was 6.3 which was down from 9.7.  He has some LLQ discomfort at times.  He has a horseshoe kidney and a right renal stone on a CT in 2015.   3.23.2021: Most recent PSA was 6.3 on 1.5.2021. He presents today c/o of sore  spot underneath his penis that he has had for the last 2 wks. This has been too painful to touch. He has tried managing pain with medications and hot baths without success. He has also tried to "pop" it without producing any drainage. This pain has started to lessen in severity but he has recently begun having some mild dysuria and increased nocturia. Otherwise, his urinary sx's are stable.      01/27/19 10/23/18 10/17/17 09/18/16 09/06/15 03/02/15 04/03/14 06/13/12  PSA  Total PSA 6.4 ng/dl 9.7 ng/dl 5.3 ng/dl 5.8  4.66  5.64  5.20  4.36   Free PSA    1.0  0.56  0.58  0.64  0.62   % Free PSA  8 %  17  12  10  12  14       Past Medical History:  Diagnosis Date  . Aortic atherosclerosis ([place]) 07/29/2020   Incidental CT finding.   . Barrett's esophagus    short-segment, diagnosed in 2007 by Dr. [name]  . BPH (benign prostatic hyperplasia)   . CAD (coronary artery disease)    a. cath on 07/21/2019 showing patent LM and LCx with 40-60% Proximal LAD and 30-40% Pro-RCA stenosis  with normal LV function.  . Diabetes mellitus without complication (HCC)    type 1   . Dyspnea    with exertion   . GERD (gastroesophageal reflux disease)   . Hiatal hernia    small  . History of kidney stones   . HTN (hypertension)   . PAF (paroxysmal atrial fibrillation) ([place])   . S/P colonoscopy 2007   unremarkable  . S/P endoscopy Oct 2011    Salmon-colored epithelium coming up to 37 cm from the   . Vertigo     Past Surgical History:  Procedure Laterality Date  . APPENDECTOMY    . BIOPSY  01/28/2020   Procedure: BIOPSY;  Surgeon: [name], MD;  Location: [facility];  Service: Endoscopy;;  esophagus  . CHOLECYSTECTOMY    . COLONOSCOPY  02/06/2006   Dr. [name]- single anal papilla, o/w normal rectum, normal colon  . COLONOSCOPY N/A 09/13/2016   RMR: three 5-6 mm for adenomas removed.  Recommend one last surveillance colonoscopy in October 2020.  [name] COLONOSCOPY WITH PROPOFOL N/A 01/28/2020    Procedure: COLONOSCOPY WITH PROPOFOL;  Surgeon: [name], MD;  Location: [facility];  Service: Endoscopy;  Laterality: N/A;  10:45am  . CYSTOSCOPY WITH RETROGRADE PYELOGRAM, URETEROSCOPY AND STENT PLACEMENT Right 08/18/2020   Procedure: CYSTOSCOPY WITH INCISION OF RIGHT URETEROCELE, URETEROSCOPY WITH HOLMIUM LASER, CYSTOLITHOLAPAXY OF 10 MM BLADDER STONE;  Surgeon: [name], MD;  Location: [facility];  Service: Urology;  Laterality: Right;  . ESOPHAGOGASTRODUODENOSCOPY  09/15/2010   Dr. [name]- Salmon-colored epithelium coming up to 37 cm from the   . ESOPHAGOGASTRODUODENOSCOPY N/A 07/02/2013   Dr. [name]- Barretts on bx. hiatal hernia  . ESOPHAGOGASTRODUODENOSCOPY N/A 09/13/2016   RMR: Barrett's esophagus without dysplasia. one last EGD in 3 years.   . ESOPHAGOGASTRODUODENOSCOPY (EGD) WITH PROPOFOL N/A 01/28/2020   Procedure: ESOPHAGOGASTRODUODENOSCOPY (EGD) WITH PROPOFOL;  Surgeon: [name], MD;  [facility]: [facility];  Service: Endoscopy;  Laterality: N/A;  . INTRAVASCULAR PRESSURE WIRE/FFR STUDY N/A 07/21/2019   Procedure: INTRAVASCULAR PRESSURE WIRE/FFR STUDY;  Surgeon: [name], MD;  Location: [facility];  Service: Cardiovascular;  Laterality: N/A;  . LEFT HEART CATH AND CORONARY ANGIOGRAPHY N/A 07/21/2019   Procedure: LEFT HEART CATH AND CORONARY ANGIOGRAPHY;  Surgeon: [name], MD;  Location: [facility];  Service: Cardiovascular;  Laterality: N/A;  . POLYPECTOMY  01/28/2020   Procedure: POLYPECTOMY;  Surgeon: [name], MD;  [facility];  Service: Endoscopy;;  colon  . ROTATOR CUFF REPAIR    . YAG LASER APPLICATION Right 12/26/348   Procedure: YAG LASER APPLICATION;  Surgeon: [name], MD;  Location: [facility];  Service: Ophthalmology;  Laterality: Right;    Home Medications:  Allergies as of 02/02/2021   No Known Allergies     Medication List       Accurate as of February 02, 2021 11:59 PM. If you have any questions, ask  your nurse or doctor.        Accu-Chek FastClix Lancets Misc 1 application by Does not apply route 2 (two) times daily.   acetaminophen 500 MG tablet Commonly known as: TYLENOL Take 1,000 mg by mouth every 6 (six) hours as needed for moderate pain.   allopurinol 100 MG tablet Commonly known as: ZYLOPRIM Take 100 mg by mouth daily.   ALPRAZolam 1 MG tablet Commonly known as: XANAX Take 0.5 mg by mouth at bedtime as needed for anxiety  or sleep.   DULoxetine 60 MG capsule Commonly known as: CYMBALTA Take 60 mg by mouth daily.   fexofenadine 180 MG tablet Commonly known as: ALLEGRA Take 180 mg by mouth daily.   finasteride 5 MG tablet Commonly known as: PROSCAR Take 1 tablet (5 mg total) by mouth daily. Started by: [name], MD   gabapentin 300 MG capsule Commonly known as: NEURONTIN Take 300 mg by mouth 2 (two) times daily.   glucose blood test strip 1 each by Other route daily before breakfast. Use as instructed   HYDROcodone-acetaminophen 5-325 MG tablet Commonly known as: Norco Take 1 tablet by mouth every 6 (six) hours as needed for moderate pain.   metFORMIN 500 MG 24 hr tablet Commonly known as: Glucophage XR Take 1 tablet (500 mg total) by mouth daily with breakfast.   pantoprazole 40 MG tablet Commonly known as: PROTONIX TAKE ONE TABLET (40MG  TOTAL) BY MOUTH TWO TIMES DAILY BEFORE A MEAL   tamsulosin 0.4 MG Caps capsule Commonly known as: FLOMAX Take 2 capsules (0.8 mg total) by mouth 2 (two) times daily.   traMADol 50 MG tablet Commonly known as: ULTRAM Take 1 tablet (50 mg total) by mouth every 6 (six) hours as needed for moderate pain (pain.).   Vitamin D3 50 MCG (2000 UT) Tabs Take 2,000 Units by mouth daily.   warfarin 5 MG tablet Commonly known as: COUMADIN Take as directed by the anticoagulation clinic. If you are unsure how to take this medication, talk to your nurse or doctor. Original instructions: TAKE ONE TABLET BY MOUTH DAILY EXCEPT  ONE-HALF TABLET ON WEDNESDAYS OR AS DIRECTED What changed:   how much to take  how to take this  when to take this  additional instructions       Allergies: No Known Allergies  Family History  Problem Relation Age of Onset  . Prostate cancer Father        deceased  . Stroke Mother        deceased  . Colon cancer Neg Hx     Social History:  reports that he quit smoking about 27 years ago. His smoking use included cigarettes. He has a 45.00 pack-year smoking history. He has never used smokeless tobacco. He reports that he does not drink alcohol and does not use drugs.  ROS: A complete review of systems was performed.  All systems are negative except for pertinent findings as noted.  Physical Exam:  There were no vitals taken for this visit.     Laboratory Data:  UA has 0-2 RBC's.   Results for orders placed or performed in visit on 02/02/21 (from the past 24 hour(s))  Urinalysis, Routine w reflex microscopic     Status: Abnormal   Collection Time: 02/02/21  3:41 PM  Result Value Ref Range   Specific Gravity, UA >1.030 (H) 1.005 - 1.030   pH, UA 5.5 5.0 - 7.5   Color, UA Yellow Yellow   Appearance Ur Clear Clear   Leukocytes,UA Negative Negative   Protein,UA Trace (A) Negative/Trace   Glucose, UA Negative Negative   Ketones, UA Negative Negative   RBC, UA 2+ (A) Negative   Bilirubin, UA Negative Negative   Urobilinogen, Ur 0.2 0.2 - 1.0 mg/dL   Nitrite, UA Negative Negative   Microscopic Examination See below:    Narrative   Performed at:  [facility] [address], [place], [place]  [id number] Lab Director: Rosita, Phone:  [phone]  Microscopic Examination     Status: Abnormal   Collection Time: 02/02/21  3:41 PM   Urine  Result Value Ref Range   WBC, UA None seen 0 - 5 /hpf   RBC 3-10 (A) 0 - 2 /hpf   Epithelial Cells (non renal) 0-10 0 - 10 /hpf   Renal Epithel, UA None seen None seen /hpf   Mucus, UA Present Not Estab.    Bacteria, UA Few None seen/Few   Narrative   Performed at:  [facility] [address], [place], [place]  [id number] Lab Director: Trenton, Phone:  [phone]       Impression/Assessment:  BPH with BOO and worsening LUTS.   I am going to add finasteride to the tamsulosin and have him return in 6 months with a PSA.   Elevated PSA.  I will assess the response to finasteride.  Hx of horseshoe kidney with stones.   CT on 12/26/20 was ok.      CC: Dr. Redmond School.

## 2021-02-02 ENCOUNTER — Encounter: Payer: Self-pay | Admitting: Urology

## 2021-02-02 ENCOUNTER — Ambulatory Visit (INDEPENDENT_AMBULATORY_CARE_PROVIDER_SITE_OTHER): Payer: Medicare Other | Admitting: Urology

## 2021-02-02 ENCOUNTER — Other Ambulatory Visit: Payer: Self-pay

## 2021-02-02 DIAGNOSIS — R3912 Poor urinary stream: Secondary | ICD-10-CM

## 2021-02-02 DIAGNOSIS — R972 Elevated prostate specific antigen [PSA]: Secondary | ICD-10-CM | POA: Diagnosis not present

## 2021-02-02 DIAGNOSIS — N200 Calculus of kidney: Secondary | ICD-10-CM | POA: Diagnosis not present

## 2021-02-02 DIAGNOSIS — I209 Angina pectoris, unspecified: Secondary | ICD-10-CM | POA: Diagnosis not present

## 2021-02-02 DIAGNOSIS — N401 Enlarged prostate with lower urinary tract symptoms: Secondary | ICD-10-CM

## 2021-02-02 DIAGNOSIS — N138 Other obstructive and reflux uropathy: Secondary | ICD-10-CM

## 2021-02-02 DIAGNOSIS — Q631 Lobulated, fused and horseshoe kidney: Secondary | ICD-10-CM

## 2021-02-02 MED ORDER — FINASTERIDE 5 MG PO TABS: 5.0000 mg | ORAL_TABLET | Freq: Every day | ORAL | 3 refills | Status: DC

## 2021-02-02 NOTE — Progress Notes (Signed)
Urological Symptom Review  Patient is experiencing the following symptoms: Hard to postpone urination Get up at night to urinate Stream starts and stops Trouble starting stream Weak stream   Review of Systems  Gastrointestinal (upper)  : Negative for upper GI symptoms  Gastrointestinal (lower) : Negative for lower GI symptoms  Constitutional : Fatigue  Skin: Negative for skin symptoms  Eyes: Negative for eye symptoms  Ear/Nose/Throat : Negative for Ear/Nose/Throat symptoms  Hematologic/Lymphatic: Negative for Hematologic/Lymphatic symptoms  Cardiovascular : Negative for cardiovascular symptoms  Respiratory : Shortness of breath  Endocrine: Negative for endocrine symptoms  Musculoskeletal: Back pain  Neurological: Negative for neurological symptoms  Psychologic: Negative for psychiatric symptoms

## 2021-02-03 ENCOUNTER — Encounter: Payer: Self-pay | Admitting: Urology

## 2021-02-03 LAB — URINALYSIS, ROUTINE W REFLEX MICROSCOPIC
Bilirubin, UA: NEGATIVE
Glucose, UA: NEGATIVE
Ketones, UA: NEGATIVE
Leukocytes,UA: NEGATIVE
Nitrite, UA: NEGATIVE
Specific Gravity, UA: >1.03 — ABNORMAL HIGH (ref 1.005–1.030)
Urobilinogen, Ur: 0.2 mg/dL (ref 0.2–1.0)
pH, UA: 5.5 (ref 5.0–7.5)

## 2021-02-03 LAB — MICROSCOPIC EXAMINATION
Renal Epithel, UA: NONE SEEN /hpf
WBC, UA: NONE SEEN /hpf (ref 0–5)

## 2021-02-13 DIAGNOSIS — M5459 Other low back pain: Secondary | ICD-10-CM | POA: Diagnosis not present

## 2021-02-13 DIAGNOSIS — Z79891 Long term (current) use of opiate analgesic: Secondary | ICD-10-CM | POA: Diagnosis not present

## 2021-02-13 DIAGNOSIS — M1388 Other specified arthritis, other site: Secondary | ICD-10-CM | POA: Diagnosis not present

## 2021-02-15 DIAGNOSIS — I480 Paroxysmal atrial fibrillation: Secondary | ICD-10-CM | POA: Diagnosis not present

## 2021-02-15 DIAGNOSIS — E7849 Other hyperlipidemia: Secondary | ICD-10-CM | POA: Diagnosis not present

## 2021-02-15 DIAGNOSIS — E119 Type 2 diabetes mellitus without complications: Secondary | ICD-10-CM | POA: Diagnosis not present

## 2021-02-16 ENCOUNTER — Telehealth: Payer: Self-pay | Admitting: *Deleted

## 2021-02-16 NOTE — Telephone Encounter (Signed)
Patient called requesting to speak with Lattie Haw.

## 2021-02-16 NOTE — Telephone Encounter (Signed)
Spoke with pt.  He missed last INR appt on 02/01/21.  States he has had a couple of nose bleeds that he has been able to stop and has a few more bruises.  Told pt to hold warfarin tonight and INR appt made for 02/20/21.  If symptoms gets get worse pt to call back or go to ED.  He verbalized understanding.

## 2021-02-20 ENCOUNTER — Ambulatory Visit (INDEPENDENT_AMBULATORY_CARE_PROVIDER_SITE_OTHER): Payer: Medicare Other | Admitting: *Deleted

## 2021-02-20 ENCOUNTER — Other Ambulatory Visit: Payer: Self-pay

## 2021-02-20 DIAGNOSIS — I4891 Unspecified atrial fibrillation: Secondary | ICD-10-CM

## 2021-02-20 DIAGNOSIS — Z5181 Encounter for therapeutic drug level monitoring: Secondary | ICD-10-CM

## 2021-02-20 LAB — POCT INR: INR: 5.3 — AB (ref 2.0–3.0)

## 2021-02-20 NOTE — Patient Instructions (Signed)
Took prednisone taper 2 wks ago for back pain Hold warfarin x 3 days (Mon,Tues,Wed) then decrease dose to 1 tablet daily except 1 1/2 tablets on Saturdays Recheck INR in 1 wk Call us with any new medications, changes or any bleeding concerns @ [phone].

## 2021-02-23 ENCOUNTER — Ambulatory Visit (INDEPENDENT_AMBULATORY_CARE_PROVIDER_SITE_OTHER): Payer: Medicare Other | Admitting: Nurse Practitioner

## 2021-02-23 ENCOUNTER — Encounter: Payer: Self-pay | Admitting: Nurse Practitioner

## 2021-02-23 VITALS — BP 117/69 | HR 81 | Ht 71.0 in | Wt 217.0 lb

## 2021-02-23 DIAGNOSIS — E1165 Type 2 diabetes mellitus with hyperglycemia: Secondary | ICD-10-CM

## 2021-02-23 DIAGNOSIS — E119 Type 2 diabetes mellitus without complications: Secondary | ICD-10-CM | POA: Diagnosis not present

## 2021-02-23 LAB — POCT GLYCOSYLATED HEMOGLOBIN (HGB A1C): HbA1c, POC (controlled diabetic range): 7.7 % — AB (ref 0.0–7.0)

## 2021-02-23 LAB — POCT UA - MICROALBUMIN: Microalbumin Ur, POC: 30 mg/L

## 2021-02-23 NOTE — Patient Instructions (Signed)

## 2021-02-23 NOTE — Progress Notes (Signed)
Endocrinology Follow Up Visit      02/23/2021, 2:40 PM      Subjective:    Patient ID: [name], male    DOB: 03-08-1941.  [name] is being seen in follow up after being seen in consultation for management of currently uncontrolled symptomatic diabetes requested by  Redmond School, MD.  He is a retired Engineer, structural for [employer].   Past Medical History:  Diagnosis Date  . Aortic atherosclerosis ([place]) 07/29/2020   Incidental CT finding.   . Barrett's esophagus    short-segment, diagnosed in 2007 by Dr. [name]  . BPH (benign prostatic hyperplasia)   . CAD (coronary artery disease)    a. cath on 07/21/2019 showing patent LM and LCx with 40-60% Proximal LAD and 30-40% Pro-RCA stenosis with normal LV function.  . Diabetes mellitus without complication (HCC)    type 1   . Dyspnea    with exertion   . GERD (gastroesophageal reflux disease)   . Hiatal hernia    small  . History of kidney stones   . HTN (hypertension)   . PAF (paroxysmal atrial fibrillation) ([place])   . S/P colonoscopy 2007   unremarkable  . S/P endoscopy Oct 2011    Salmon-colored epithelium coming up to 37 cm from the   . Vertigo     Past Surgical History:  Procedure Laterality Date  . APPENDECTOMY    . BIOPSY  01/28/2020   Procedure: BIOPSY;  Surgeon: [name], MD;  Location: [facility];  Service: Endoscopy;;  esophagus  . CHOLECYSTECTOMY    . COLONOSCOPY  02/06/2006   Dr. [name]- single anal papilla, o/w normal rectum, normal colon  . COLONOSCOPY N/A 09/13/2016   RMR: three 5-6 mm for adenomas removed.  Recommend one last surveillance colonoscopy in October 2020.  [name] COLONOSCOPY WITH PROPOFOL N/A 01/28/2020   Procedure: COLONOSCOPY WITH PROPOFOL;  Surgeon: [name], MD;  Location: [facility];  Service: Endoscopy;  Laterality: N/A;  10:45am  . CYSTOSCOPY WITH RETROGRADE PYELOGRAM,  URETEROSCOPY AND STENT PLACEMENT Right 08/18/2020   Procedure: CYSTOSCOPY WITH INCISION OF RIGHT URETEROCELE, URETEROSCOPY WITH HOLMIUM LASER, CYSTOLITHOLAPAXY OF 10 MM BLADDER STONE;  Surgeon: [name], MD;  Location: [facility];  Service: Urology;  Laterality: Right;  . ESOPHAGOGASTRODUODENOSCOPY  09/15/2010   Dr. [name]- Salmon-colored epithelium coming up to 37 cm from the   . ESOPHAGOGASTRODUODENOSCOPY N/A 07/02/2013   Dr. [name]- Barretts on bx. hiatal hernia  . ESOPHAGOGASTRODUODENOSCOPY N/A 09/13/2016   RMR: Barrett's esophagus without dysplasia. one last EGD in 3 years.   . ESOPHAGOGASTRODUODENOSCOPY (EGD) WITH PROPOFOL N/A 01/28/2020   Procedure: ESOPHAGOGASTRODUODENOSCOPY (EGD) WITH PROPOFOL;  Surgeon: [name], MD;  [facility];  Service: Endoscopy;  Laterality: N/A;  . INTRAVASCULAR PRESSURE WIRE/FFR STUDY N/A 07/21/2019   Procedure: INTRAVASCULAR PRESSURE WIRE/FFR STUDY;  Surgeon: [name], MD;  Location: [place] [facility];  Service: Cardiovascular;  Laterality: N/A;  . LEFT HEART CATH AND CORONARY ANGIOGRAPHY N/A 07/21/2019   Procedure: LEFT HEART CATH AND CORONARY ANGIOGRAPHY;  Surgeon: [name], MD;  Location:  [facility];  Service: Cardiovascular;  Laterality: N/A;  . POLYPECTOMY  01/28/2020   Procedure: POLYPECTOMY;  Surgeon: [name], MD;  [facility];  Service: Endoscopy;;  colon  . ROTATOR CUFF REPAIR    . YAG LASER APPLICATION Right 0/07/2329   Procedure: YAG LASER APPLICATION;  Surgeon: [name], MD;  Location: [facility];  Service: Ophthalmology;  Laterality: Right;    Social History   Socioeconomic History  . Marital status: Married    Spouse name: Not on file  . Number of children: Not on file  . Years of education: Not on file  . Highest education level: Not on file  Occupational History  . Not on file  Tobacco Use  . Smoking status: Former Smoker    Packs/day: 1.50    Years: 30.00    Pack years: 45.00     Types: Cigarettes    Quit date: 11/18/1993    Years since quitting: 27.2  . Smokeless tobacco: Never Used  . Tobacco comment: quit in the 90's  Vaping Use  . Vaping Use: Never used  Substance and Sexual Activity  . Alcohol use: No  . Drug use: No  . Sexual activity: Not on file  Other Topics Concern  . Not on file  Social History Narrative  . Not on file   Social Determinants of Health   Financial Resource Strain: Not on file  Food Insecurity: Not on file  Transportation Needs: Not on file  Physical Activity: Not on file  Stress: Not on file  Social Connections: Not on file    Family History  Problem Relation Age of Onset  . Prostate cancer Father        deceased  . Stroke Mother        deceased  . Colon cancer Neg Hx     Outpatient Encounter Medications as of 02/23/2021  Medication Sig  . Accu-Chek FastClix Lancets MISC 1 application by Does not apply route 2 (two) times daily.  [name] acetaminophen (TYLENOL) 500 MG tablet Take 1,000 mg by mouth every 6 (six) hours as needed for moderate pain.   [name] allopurinol (ZYLOPRIM) 100 MG tablet Take 100 mg by mouth daily.   [name] ALPRAZolam (XANAX) 1 MG tablet Take 0.5 mg by mouth at bedtime as needed for anxiety or sleep.   [name] atorvastatin (LIPITOR) 40 MG tablet Take 1 tablet by mouth daily.  . Cholecalciferol (VITAMIN D3) 50 MCG (2000 UT) TABS Take 2,000 Units by mouth daily.  . DULoxetine (CYMBALTA) 60 MG capsule Take 60 mg by mouth daily.  . fexofenadine (ALLEGRA) 180 MG tablet Take 180 mg by mouth daily.  . finasteride (PROSCAR) 5 MG tablet Take 1 tablet (5 mg total) by mouth daily.  [name] gabapentin (NEURONTIN) 300 MG capsule Take 300 mg by mouth 2 (two) times daily.   [name] glucose blood test strip 1 each by Other route daily before breakfast. Use as instructed  . HYDROcodone-acetaminophen (NORCO) 7.5-325 MG tablet hydrocodone 7.5 mg-acetaminophen 325 mg tablet  . metFORMIN (GLUCOPHAGE XR) 500 MG 24 hr tablet Take 1 tablet (500 mg total)  by mouth daily with breakfast.  . pantoprazole (PROTONIX) 40 MG tablet TAKE ONE TABLET (40MG  TOTAL) BY MOUTH TWO TIMES DAILY BEFORE A MEAL  . tamsulosin (FLOMAX) 0.4 MG CAPS capsule Take 2 capsules (0.8 mg total) by mouth 2 (two) times daily.  . traMADol (ULTRAM) 50 MG tablet Take 1 tablet (50 mg total) by mouth every  6 (six) hours as needed for moderate pain (pain.).  [name] warfarin (COUMADIN) 5 MG tablet TAKE ONE TABLET BY MOUTH DAILY EXCEPT ONE-HALF TABLET ON WEDNESDAYS OR AS DIRECTED (Patient taking differently: Take 2.5-5 mg by mouth See admin instructions. Take 1 tablet (5 mg) by mouth daily, except take 0.5 tablet (2.5 mg) by mouth on Wednesdays or as directed)  . [DISCONTINUED] HYDROcodone-acetaminophen (NORCO) 5-325 MG tablet Take 1 tablet by mouth every 6 (six) hours as needed for moderate pain.   No facility-administered encounter medications on file as of 02/23/2021.    ALLERGIES: No Known Allergies  VACCINATION STATUS: Immunization History  Administered Date(s) Administered  . Influenza-Unspecified 09/19/2013    Diabetes He presents for his follow-up diabetic visit. He has type 2 diabetes mellitus. The initial diagnosis of diabetes was made 3 years (diagnosed at approximate age of 32) ago. His disease course has been fluctuating. Hypoglycemia symptoms include dizziness, nervousness/anxiousness, sweats and tremors. Pertinent negatives for hypoglycemia include no confusion, headaches or seizures. Associated symptoms include fatigue and foot paresthesias. Pertinent negatives for diabetes include no chest pain, no foot ulcerations, no polydipsia, no polyuria, no visual change and no weight loss. There are no hypoglycemic complications. Symptoms are worsening. Diabetic complications include heart disease and peripheral neuropathy. Risk factors for coronary artery disease include diabetes mellitus, dyslipidemia, hypertension, male sex, obesity and sedentary lifestyle. Current diabetic treatment  includes oral agent (dual therapy). He is compliant with treatment most of the time. His weight is fluctuating minimally. He is following a generally unhealthy diet. When asked about meal planning, he reported none. He has not had a previous visit with a dietitian. He rarely participates in exercise. His home blood glucose trend is increasing steadily. His breakfast blood glucose range is generally 110-130 mg/dl. His overall blood glucose range is 140-180 mg/dl. (He presents today with his meter and logs showing above target fasting and postprandial glycemic profile.  His POCT A1c today is 7.7%, increasing from last visit of 7.1%.  He reports recent use of oral steroids for back pain.  He did not stop taking his Glipizide as recommended at last visit due to misunderstanding.  He is still having frequent symptoms of hypoglycemia during the day.) An ACE inhibitor/angiotensin II receptor blocker is not being taken. He does not see a podiatrist.Eye exam is current.    Review of systems  Constitutional: + Minimally fluctuating body weight,  current Body mass index is 30.27 kg/m. , + fatigue (primary caregiver for his wife who has Parkinsons), no subjective hyperthermia, no subjective hypothermia Eyes: no blurry vision, no xerophthalmia ENT: no sore throat, no nodules palpated in throat, no dysphagia/odynophagia, no hoarseness Cardiovascular: no chest pain, no shortness of breath, no palpitations, no leg swelling Respiratory: no cough, no shortness of breath Gastrointestinal: no nausea/vomiting/diarrhea Musculoskeletal: no muscle/joint aches Skin: no rashes, no hyperemia Neurological: no tremors, + numbness/tingling to BLE (feet)-worsening, + intermittent dizziness/ lightheadedness Psychiatric: no depression, no anxiety   Objective:    BP 117/69 (BP Location: Left Arm, Patient Position: Sitting)   Pulse 81   Ht 5\' 11"  (1.803 m)   Wt 217 lb (98.4 kg)   BMI 30.27 kg/m   Wt Readings from Last 3  Encounters:  02/23/21 217 lb (98.4 kg)  11/04/20 209 lb (94.8 kg)  09/02/20 209 lb 3.2 oz (94.9 kg)    BP Readings from Last 3 Encounters:  02/23/21 117/69  11/08/20 122/75  11/04/20 99/62    Physical Exam- Limited  Constitutional:  Body  mass index is 30.27 kg/m. , not in acute distress, mildly depressed state of mind Eyes:  EOMI, no exophthalmos Neck: Supple Cardiovascular: RRR, no murmers, rubs, or gallops, no edema Respiratory: Adequate breathing efforts, no crackles, rales, rhonchi, or wheezing Musculoskeletal: no gross deformities, strength intact in all four extremities, no gross restriction of joint movements Skin:  no rashes, no hyperemia Neurological: no tremor with outstretched hands    CMP ( most recent) CMP     Component Value Date/Time   NA 141 08/12/2020 1336   K 3.9 08/12/2020 1336   CL 105 08/12/2020 1336   CO2 28 08/12/2020 1336   GLUCOSE 69 (L) 08/12/2020 1336   BUN 13 08/12/2020 1336   CREATININE 1.10 12/26/2020 1736   CREATININE 1.16 07/03/2019 1343   CALCIUM 9.3 08/12/2020 1336   PROT 6.7 03/27/2019 1322   ALBUMIN 4.2 03/27/2019 1322   AST 24 03/27/2019 1322   ALT 20 03/27/2019 1322   ALKPHOS 58 03/27/2019 1322   BILITOT 0.9 03/27/2019 1322   GFRNONAA >60 08/12/2020 1336   GFRAA >60 08/12/2020 1336     Diabetic Labs (most recent): Lab Results  Component Value Date   HGBA1C 7.7 (A) 02/23/2021   HGBA1C 7.1 (H) 08/12/2020     Lipid Panel ( most recent) Lipid Panel  No results found for: CHOL, TRIG, HDL, CHOLHDL, VLDL, LDLCALC, LDLDIRECT, LABVLDL    Lab Results  Component Value Date   TSH 3.235 05/02/2018         Assessment & Plan:   1) Type 2 diabetes mellitus with hyperglycemia, without long-term current use of insulin ([place])  He presents today with his meter and logs showing above target fasting and postprandial glycemic profile.  His POCT A1c today is 7.7%, increasing from last visit of 7.1%.  He reports recent use of oral  steroids for back pain.  He did not stop taking his Glipizide as recommended at last visit due to misunderstanding.  He is still having frequent symptoms of hypoglycemia during the day.  - RISHIKESH KHACHATRYAN has currently uncontrolled symptomatic type 2 DM since 81 years of age.   Recent labs reviewed.  - I had a long discussion with him about the progressive nature of diabetes and the pathology behind its complications.  -his diabetes is complicated by CAD, peripheral neuropathy and he remains at a high risk for more acute and chronic complications which include CAD, CVA, CKD, retinopathy, and neuropathy. These are all discussed in detail with him.  - Nutritional counseling repeated at each appointment due to patients tendency to fall back in to old habits.  - The patient admits there is a room for improvement in their diet and drink choices. -  Suggestion is made for the patient to avoid simple carbohydrates from their diet including Cakes, Sweet Desserts / Pastries, Ice Cream, Soda (diet and regular), Sweet Tea, Candies, Chips, Cookies, Sweet Pastries,  Store Bought Juices, Alcohol in Excess of  1-2 drinks a day, Artificial Sweeteners, Coffee Creamer, and "Sugar-free" Products. This will help patient to have stable blood glucose profile and potentially avoid unintended weight gain.   - I encouraged the patient to switch to  unprocessed or minimally processed complex starch and increased protein intake (animal or plant source), fruits, and vegetables.   - Patient is advised to stick to a routine mealtimes to eat 3 meals  a day and avoid unnecessary snacks ( to snack only to correct hypoglycemia).  - I have approached him with  the following individualized plan to manage  his diabetes and patient agrees:   -Given his account of intermittent random hypoglycemia, I recommended he stop his Glipizide for now and place his medications in a separate place so he does not take them inadvertently.   He is  advised to continue Metformin 500 mg ER daily with breakfast.   -Given monotherapy with Metformin alone, he can take a break from routine glucose monitoring.  - Specific targets for  A1c;  LDL, HDL,  and Triglycerides were discussed with the patient.  2) Blood Pressure /Hypertension:  His blood pressure is controlled to target without the use of antihypertensive medications.  3) Lipids/Hyperlipidemia:  There are no recent lipid panel results to review, nor does he take any medication for high cholesterol.  He reports he has had his cholesterol checked at his PCP.  He will request copy be sent for our records here.   4)  Weight/Diet:  His Body mass index is 06.23 kg/m.  - complicating his diabetes care.   he is a candidate for some weight loss. I discussed with him the fact that loss of 5 - 10% of his  current body weight will have the most impact on his diabetes management.  Exercise, and detailed carbohydrates information provided  -  detailed on discharge instructions.  5) Chronic Care/Health Maintenance: -he is encouraged to initiate and continue to follow up with Ophthalmology, Dentist,  Podiatrist at least yearly or according to recommendations, and advised to stay away from smoking and/or secondhand smoke exposure. I have recommended yearly flu vaccine and pneumonia vaccine at least every 5 years; moderate intensity exercise for up to 150 minutes weekly; and  sleep for at least 7 hours a day.  - he is advised to maintain close follow up with Redmond School, MD for primary care needs, as well as his other providers for optimal and coordinated care.      I spent 44 minutes in the care of the patient today including review of labs from [facility], Lipids, Thyroid Function, Hematology (current and previous including abstractions from other facilities); face-to-face time discussing  his blood glucose readings/logs, discussing hypoglycemia and hyperglycemia episodes and symptoms, medications  doses, his options of short and long term treatment based on the latest standards of care / guidelines;  discussion about incorporating lifestyle medicine;  and documenting the encounter.    Please refer to Patient Instructions for Blood Glucose Monitoring and Insulin/Medications Dosing Guide"  in media tab for additional information. Please  also refer to " Patient Self Inventory" in the Media  tab for reviewed elements of pertinent patient history.  [name] participated in the discussions, expressed understanding, and voiced agreement with the above plans.  All questions were answered to his satisfaction. he is encouraged to contact clinic should he have any questions or concerns prior to his return visit.  Follow up plan: - Return in about 4 months (around 06/25/2021) for Diabetes follow up with A1c in office, Previsit labs.  Rayetta Pigg, [facility] [address] [place], [place] [address] Phone: [phone] Fax: [phone]  02/23/2021, 2:40 PM

## 2021-02-24 ENCOUNTER — Ambulatory Visit: Payer: Medicare Other | Admitting: Internal Medicine

## 2021-03-01 ENCOUNTER — Other Ambulatory Visit (HOSPITAL_COMMUNITY): Payer: Self-pay | Admitting: Internal Medicine

## 2021-03-01 ENCOUNTER — Other Ambulatory Visit: Payer: Self-pay

## 2021-03-01 ENCOUNTER — Ambulatory Visit (INDEPENDENT_AMBULATORY_CARE_PROVIDER_SITE_OTHER): Payer: Medicare Other | Admitting: *Deleted

## 2021-03-01 ENCOUNTER — Ambulatory Visit (HOSPITAL_COMMUNITY)
Admission: RE | Admit: 2021-03-01 | Discharge: 2021-03-01 | Disposition: A | Discharge status: Home / Self Care | Payer: Medicare Other | Source: Ambulatory Visit | Attending: Internal Medicine | Admitting: Internal Medicine

## 2021-03-01 DIAGNOSIS — R0602 Shortness of breath: Secondary | ICD-10-CM | POA: Diagnosis not present

## 2021-03-01 DIAGNOSIS — E1142 Type 2 diabetes mellitus with diabetic polyneuropathy: Secondary | ICD-10-CM | POA: Diagnosis not present

## 2021-03-01 DIAGNOSIS — R059 Cough, unspecified: Secondary | ICD-10-CM | POA: Insufficient documentation

## 2021-03-01 DIAGNOSIS — Z5181 Encounter for therapeutic drug level monitoring: Secondary | ICD-10-CM | POA: Diagnosis not present

## 2021-03-01 DIAGNOSIS — Z1331 Encounter for screening for depression: Secondary | ICD-10-CM | POA: Diagnosis not present

## 2021-03-01 DIAGNOSIS — J209 Acute bronchitis, unspecified: Secondary | ICD-10-CM | POA: Diagnosis not present

## 2021-03-01 DIAGNOSIS — J329 Chronic sinusitis, unspecified: Secondary | ICD-10-CM | POA: Diagnosis not present

## 2021-03-01 DIAGNOSIS — Z6825 Body mass index (BMI) 25.0-25.9, adult: Secondary | ICD-10-CM | POA: Diagnosis not present

## 2021-03-01 DIAGNOSIS — I4891 Unspecified atrial fibrillation: Secondary | ICD-10-CM

## 2021-03-01 LAB — POCT INR: INR: 2 (ref 2.0–3.0)

## 2021-03-01 NOTE — Patient Instructions (Signed)
Continue warfarin 1 tablet daily except 1 1/2 tablets on Saturdays Recheck INR in 3 wk Call us with any new medications, changes or any bleeding concerns @ [phone].  Starting on Abx for congestion  [place]

## 2021-03-18 DIAGNOSIS — E7849 Other hyperlipidemia: Secondary | ICD-10-CM | POA: Diagnosis not present

## 2021-03-18 DIAGNOSIS — E119 Type 2 diabetes mellitus without complications: Secondary | ICD-10-CM | POA: Diagnosis not present

## 2021-03-18 DIAGNOSIS — I480 Paroxysmal atrial fibrillation: Secondary | ICD-10-CM | POA: Diagnosis not present

## 2021-03-21 ENCOUNTER — Other Ambulatory Visit: Payer: Self-pay | Admitting: Family Medicine

## 2021-03-22 ENCOUNTER — Ambulatory Visit (INDEPENDENT_AMBULATORY_CARE_PROVIDER_SITE_OTHER): Payer: Medicare Other | Admitting: *Deleted

## 2021-03-22 DIAGNOSIS — I4891 Unspecified atrial fibrillation: Secondary | ICD-10-CM | POA: Diagnosis not present

## 2021-03-22 DIAGNOSIS — Z5181 Encounter for therapeutic drug level monitoring: Secondary | ICD-10-CM | POA: Diagnosis not present

## 2021-03-22 LAB — POCT INR: INR: 3.2 — AB (ref 2.0–3.0)

## 2021-03-22 NOTE — Patient Instructions (Signed)
Hold warfarin tonight then resume 1 tablet daily except 1 1/2 tablets on Saturdays Recheck INR in 4 wk Call us with any new medications, changes or any bleeding concerns @ [phone].

## 2021-03-27 DIAGNOSIS — E1142 Type 2 diabetes mellitus with diabetic polyneuropathy: Secondary | ICD-10-CM | POA: Diagnosis not present

## 2021-03-27 DIAGNOSIS — M5416 Radiculopathy, lumbar region: Secondary | ICD-10-CM | POA: Diagnosis not present

## 2021-03-27 DIAGNOSIS — M130 Polyarthritis, unspecified: Secondary | ICD-10-CM | POA: Diagnosis not present

## 2021-03-27 DIAGNOSIS — Z79891 Long term (current) use of opiate analgesic: Secondary | ICD-10-CM | POA: Diagnosis not present

## 2021-03-27 DIAGNOSIS — M5459 Other low back pain: Secondary | ICD-10-CM | POA: Diagnosis not present

## 2021-03-27 DIAGNOSIS — G894 Chronic pain syndrome: Secondary | ICD-10-CM | POA: Diagnosis not present

## 2021-03-27 DIAGNOSIS — M1388 Other specified arthritis, other site: Secondary | ICD-10-CM | POA: Diagnosis not present

## 2021-04-18 ENCOUNTER — Ambulatory Visit: Payer: Medicare Other | Admitting: Internal Medicine

## 2021-04-18 DIAGNOSIS — E7849 Other hyperlipidemia: Secondary | ICD-10-CM | POA: Diagnosis not present

## 2021-04-18 DIAGNOSIS — E119 Type 2 diabetes mellitus without complications: Secondary | ICD-10-CM | POA: Diagnosis not present

## 2021-04-18 DIAGNOSIS — I480 Paroxysmal atrial fibrillation: Secondary | ICD-10-CM | POA: Diagnosis not present

## 2021-04-21 DIAGNOSIS — E663 Overweight: Secondary | ICD-10-CM | POA: Diagnosis not present

## 2021-04-21 DIAGNOSIS — I480 Paroxysmal atrial fibrillation: Secondary | ICD-10-CM | POA: Diagnosis not present

## 2021-04-21 DIAGNOSIS — Z6825 Body mass index (BMI) 25.0-25.9, adult: Secondary | ICD-10-CM | POA: Diagnosis not present

## 2021-04-21 DIAGNOSIS — N400 Enlarged prostate without lower urinary tract symptoms: Secondary | ICD-10-CM | POA: Diagnosis not present

## 2021-04-21 DIAGNOSIS — I2510 Atherosclerotic heart disease of native coronary artery without angina pectoris: Secondary | ICD-10-CM | POA: Diagnosis not present

## 2021-04-21 DIAGNOSIS — E1165 Type 2 diabetes mellitus with hyperglycemia: Secondary | ICD-10-CM | POA: Diagnosis not present

## 2021-04-24 ENCOUNTER — Ambulatory Visit (INDEPENDENT_AMBULATORY_CARE_PROVIDER_SITE_OTHER): Payer: Medicare Other | Admitting: *Deleted

## 2021-04-24 DIAGNOSIS — Z5181 Encounter for therapeutic drug level monitoring: Secondary | ICD-10-CM

## 2021-04-24 DIAGNOSIS — I4891 Unspecified atrial fibrillation: Secondary | ICD-10-CM

## 2021-04-24 LAB — POCT INR: INR: 2.3 (ref 2.0–3.0)

## 2021-04-24 NOTE — Patient Instructions (Signed)
Continue warfarin 1 tablet daily except 1 1/2 tablets on Saturdays Recheck INR in 4 wk Call us with any new medications, changes or any bleeding concerns @ [phone].  

## 2021-05-18 DIAGNOSIS — E7849 Other hyperlipidemia: Secondary | ICD-10-CM | POA: Diagnosis not present

## 2021-05-18 DIAGNOSIS — I480 Paroxysmal atrial fibrillation: Secondary | ICD-10-CM | POA: Diagnosis not present

## 2021-05-18 DIAGNOSIS — E119 Type 2 diabetes mellitus without complications: Secondary | ICD-10-CM | POA: Diagnosis not present

## 2021-05-24 DIAGNOSIS — I4891 Unspecified atrial fibrillation: Secondary | ICD-10-CM | POA: Diagnosis not present

## 2021-05-24 DIAGNOSIS — G5601 Carpal tunnel syndrome, right upper limb: Secondary | ICD-10-CM | POA: Diagnosis not present

## 2021-05-24 DIAGNOSIS — E1140 Type 2 diabetes mellitus with diabetic neuropathy, unspecified: Secondary | ICD-10-CM | POA: Diagnosis not present

## 2021-05-24 DIAGNOSIS — E663 Overweight: Secondary | ICD-10-CM | POA: Diagnosis not present

## 2021-05-24 DIAGNOSIS — Z6825 Body mass index (BMI) 25.0-25.9, adult: Secondary | ICD-10-CM | POA: Diagnosis not present

## 2021-05-24 DIAGNOSIS — G629 Polyneuropathy, unspecified: Secondary | ICD-10-CM | POA: Diagnosis not present

## 2021-05-24 DIAGNOSIS — E538 Deficiency of other specified B group vitamins: Secondary | ICD-10-CM | POA: Diagnosis not present

## 2021-05-24 DIAGNOSIS — E559 Vitamin D deficiency, unspecified: Secondary | ICD-10-CM | POA: Diagnosis not present

## 2021-05-29 ENCOUNTER — Ambulatory Visit (INDEPENDENT_AMBULATORY_CARE_PROVIDER_SITE_OTHER): Payer: Medicare Other | Admitting: *Deleted

## 2021-05-29 ENCOUNTER — Other Ambulatory Visit: Payer: Self-pay

## 2021-05-29 DIAGNOSIS — Z5181 Encounter for therapeutic drug level monitoring: Secondary | ICD-10-CM | POA: Diagnosis not present

## 2021-05-29 DIAGNOSIS — I4891 Unspecified atrial fibrillation: Secondary | ICD-10-CM

## 2021-05-29 LAB — POCT INR: INR: 3 (ref 2.0–3.0)

## 2021-05-29 NOTE — Patient Instructions (Signed)
Continue warfarin 1 tablet daily except 1 1/2 tablets on Saturdays Recheck INR in 4 wk Call us with any new medications, changes or any bleeding concerns @ [phone].  

## 2021-05-30 ENCOUNTER — Encounter: Payer: Self-pay | Admitting: Internal Medicine

## 2021-05-30 ENCOUNTER — Ambulatory Visit (INDEPENDENT_AMBULATORY_CARE_PROVIDER_SITE_OTHER): Payer: Medicare Other | Admitting: Internal Medicine

## 2021-05-30 VITALS — BP 110/67 | HR 61 | Temp 97.3°F | Ht 71.0 in | Wt 213.8 lb

## 2021-05-30 DIAGNOSIS — Z86010 Personal history of colonic polyps: Secondary | ICD-10-CM

## 2021-05-30 DIAGNOSIS — K2100 Gastro-esophageal reflux disease with esophagitis, without bleeding: Secondary | ICD-10-CM | POA: Diagnosis not present

## 2021-05-30 DIAGNOSIS — I209 Angina pectoris, unspecified: Secondary | ICD-10-CM | POA: Diagnosis not present

## 2021-05-30 DIAGNOSIS — K2270 Barrett's esophagus without dysplasia: Secondary | ICD-10-CM | POA: Diagnosis not present

## 2021-05-30 NOTE — Progress Notes (Signed)
Primary Care Physician:  Redmond School, MD Primary Gastroenterologist:  Dr. [name]  Pre-Procedure History & Physical: HPI:  [name] is a 80 y.o. male here for follow-up reflux/Barrett's esophagus.  Doing well on Protonix 40 mg twice daily.  Needs to take it twice daily to control his symptoms.  EGD 2021 with biopsies demonstrated no dysplasia.  History of colonic adenomas previously removed-future colonoscopy not recommended for surveillance due to age.  He is having no dysphagia reflux symptoms again well controlled.  Denies constipation diarrhea rectal bleeding or melena.  Having quite a bit of stress taking care of his bedbound wife at home who is on in-home hospice.  He sleeps poorly.  He is having issues with hand pain for which she is seeing a specialist in the near future.  He tells me his right lower extremity goes to sleep and he cannot move it from time to time.  Past Medical History:  Diagnosis Date   Aortic atherosclerosis ([place]) 07/29/2020   Incidental CT finding.    Barrett's esophagus    short-segment, diagnosed in 2007 by Dr. [name]   BPH (benign prostatic hyperplasia)    CAD (coronary artery disease)    a. cath on 07/21/2019 showing patent LM and LCx with 40-60% Proximal LAD and 30-40% Pro-RCA stenosis with normal LV function.   Diabetes mellitus without complication (HCC)    type 1    Dyspnea    with exertion    GERD (gastroesophageal reflux disease)    Hiatal hernia    small   History of kidney stones    HTN (hypertension)    PAF (paroxysmal atrial fibrillation) ([place])    S/P colonoscopy 2007   unremarkable   S/P endoscopy Oct 2011    Salmon-colored epithelium coming up to 37 cm from the    Vertigo     Past Surgical History:  Procedure Laterality Date   APPENDECTOMY     BIOPSY  01/28/2020   Procedure: BIOPSY;  Surgeon: [name], MD;  [facility];  Service: Endoscopy;;  esophagus   CHOLECYSTECTOMY     COLONOSCOPY  02/06/2006   Dr.  [name]- single anal papilla, o/w normal rectum, normal colon   COLONOSCOPY N/A 09/13/2016   RMR: three 5-6 mm for adenomas removed.  Recommend one last surveillance colonoscopy in October 2020.   COLONOSCOPY WITH PROPOFOL N/A 01/28/2020   Procedure: COLONOSCOPY WITH PROPOFOL;  Surgeon: [name], MD;  [facility];  Service: Endoscopy;  Laterality: N/A;  10:45am   CYSTOSCOPY WITH RETROGRADE PYELOGRAM, URETEROSCOPY AND STENT PLACEMENT Right 08/18/2020   Procedure: CYSTOSCOPY WITH INCISION OF RIGHT URETEROCELE, URETEROSCOPY WITH HOLMIUM LASER, CYSTOLITHOLAPAXY OF 10 MM BLADDER STONE;  Surgeon: [name], MD;  [facility];  Service: Urology;  Laterality: Right;   ESOPHAGOGASTRODUODENOSCOPY  09/15/2010   Dr. [name]- Salmon-colored epithelium coming up to 37 cm from the    ESOPHAGOGASTRODUODENOSCOPY N/A 07/02/2013   Dr. [name]- Barretts on bx. hiatal hernia   ESOPHAGOGASTRODUODENOSCOPY N/A 09/13/2016   RMR: Barrett's esophagus without dysplasia. one last EGD in 3 years.    ESOPHAGOGASTRODUODENOSCOPY (EGD) WITH PROPOFOL N/A 01/28/2020   Procedure: ESOPHAGOGASTRODUODENOSCOPY (EGD) WITH PROPOFOL;  Surgeon: [name], MD;  [facility];  Service: Endoscopy;  Laterality: N/A;   INTRAVASCULAR PRESSURE WIRE/FFR STUDY N/A 07/21/2019   Procedure: INTRAVASCULAR PRESSURE WIRE/FFR STUDY;  Surgeon: [name], MD;  Location: [facility];  Service: Cardiovascular;  Laterality: N/A;  LEFT HEART CATH AND CORONARY ANGIOGRAPHY N/A 07/21/2019   Procedure: LEFT HEART CATH AND CORONARY ANGIOGRAPHY;  Surgeon: [name], MD;  Location: [facility];  Service: Cardiovascular;  Laterality: N/A;   POLYPECTOMY  01/28/2020   Procedure: POLYPECTOMY;  Surgeon: [name], MD;  [facility];  Service: Endoscopy;;  colon   ROTATOR CUFF REPAIR     YAG LASER APPLICATION Right 07/28/2425   Procedure: YAG LASER APPLICATION;  Surgeon: [name], MD;  [facility]: [facility];  Service: Ophthalmology;  Laterality: Right;    Prior to Admission medications   Medication Sig Start Date End Date Taking? Authorizing Provider  Accu-Chek FastClix Lancets MISC 1 application by Does not apply route 2 (two) times daily. 08/23/20  Yes Brita Romp, NP  allopurinol (ZYLOPRIM) 100 MG tablet Take 100 mg by mouth daily.  02/12/19  Yes [provider]  atorvastatin (LIPITOR) 40 MG tablet Take 1 tablet by mouth daily. 02/14/21  Yes [provider]  Cholecalciferol (VITAMIN D3) 50 MCG (2000 UT) TABS Take 2,000 Units by mouth daily.   Yes [provider]  DULoxetine (CYMBALTA) 60 MG capsule Take 60 mg by mouth daily. 10/20/20  Yes [provider]  fexofenadine (ALLEGRA) 180 MG tablet Take 180 mg by mouth daily.   Yes [provider]  finasteride (PROSCAR) 5 MG tablet Take 1 tablet (5 mg total) by mouth daily. 02/02/21  Yes [name], MD  gabapentin (NEURONTIN) 300 MG capsule Take 300 mg by mouth 2 (two) times daily.    Yes [provider]  glucose blood test strip 1 each by Other route daily before breakfast. Use as instructed 09/26/20  Yes Reardon, Juanetta Beets, NP  HYDROcodone-acetaminophen ([place]) 7.5-325 MG tablet as needed.   Yes [provider]  metFORMIN (GLUCOPHAGE XR) 500 MG 24 hr tablet Take 1 tablet (500 mg total) by mouth daily with breakfast. 08/30/20  Yes Reardon, Juanetta Beets, NP  pantoprazole (PROTONIX) 40 MG tablet TAKE ONE TABLET (40MG  TOTAL) BY MOUTH TWO TIMES DAILY BEFORE A MEAL 01/02/21  Yes Mahala Menghini, PA-C  tamsulosin (FLOMAX) 0.4 MG CAPS capsule Take 2 capsules (0.8 mg total) by mouth 2 (two) times daily. 09/02/20  Yes [name], MD  traMADol (ULTRAM) 50 MG tablet Take 1 tablet (50 mg total) by mouth every 6 (six) hours as needed for moderate pain (pain.). Patient taking differently: Take 50 mg by mouth as needed for moderate pain (pain.). 08/18/20  Yes [name], MD  warfarin (COUMADIN) 5 MG  tablet Take 1 tablet daily except 1 1/2 tablets on Saturday or as directed 03/21/21  Yes Verta Ellen., NP  acetaminophen (TYLENOL) 500 MG tablet Take 1,000 mg by mouth every 6 (six) hours as needed for moderate pain.  Patient not taking: Reported on 05/30/2021    [provider]  ALPRAZolam Duanne Moron) 1 MG tablet Take 0.5 mg by mouth at bedtime as needed for anxiety or sleep.  Patient not taking: Reported on 05/30/2021    [provider]    Allergies as of 05/30/2021   (No Known Allergies)    Family History  Problem Relation Age of Onset   Prostate cancer Father        deceased   Stroke Mother        deceased   Colon cancer Neg Hx     Social History   Socioeconomic History   Marital status: Married    Spouse  name: Not on file   Number of children: Not on file   Years of education: Not on file   Highest education level: Not on file  Occupational History   Not on file  Tobacco Use   Smoking status: Former    Packs/day: 1.50    Years: 30.00    Pack years: 45.00    Types: Cigarettes    Quit date: 11/18/1993    Years since quitting: 27.5   Smokeless tobacco: Never   Tobacco comments:    quit in the 90's  Vaping Use   Vaping Use: Never used  Substance and Sexual Activity   Alcohol use: No   Drug use: No   Sexual activity: Not on file  Other Topics Concern   Not on file  Social History Narrative   Not on file   Social Determinants of Health   Financial Resource Strain: Not on file  Food Insecurity: Not on file  Transportation Needs: Not on file  Physical Activity: Not on file  Stress: Not on file  Social Connections: Not on file  Intimate Partner Violence: Not on file    Review of Systems: See HPI, otherwise negative ROS  Physical Exam: BP 110/67   Pulse 61   Temp (!) 97.3 F (36.3 C) (Temporal)   Ht 5\' 11"  (1.803 m)   Wt 213 lb 12.8 oz (97 kg)   BMI 29.82 kg/m  General:   Alert, somewhat disheveled gentleman , pleasant and  cooperative in NAD Neck:  Supple; no masses or thyromegaly. No significant cervical adenopathy. Lungs:  Clear throughout to auscultation.   No wheezes, crackles, or rhonchi. No acute distress. Heart:  Regular rate and rhythm; no murmurs, clicks, rubs,  or gallops. Abdomen: Non-distended, normal bowel sounds.  Soft and nontender without appreciable mass or hepatosplenomegaly.   Impression/Plan: 80 year old gentleman history of longstanding GERD and Barrett's esophagus without dysplasia.  Symptoms are well controlled on twice daily PPI therapy.  Breakthrough symptoms on once a day therapy.  No alarm symptoms.  No dysphagia.  History of colonic polyps; no bowel symptoms.  Future surveillance for both Barrett's and polyps not recommended due to age and comorbidities.  He has quite a bit of situational stress/burden at home taking care of his infirmed wife.  Is having issues with weak right lower extremity and pain in his hands for which she is seeking out further evaluation  Recommendations:  Continue Protonix 40 mg twice daily  We will patient back in the office in 1 year and as needed  GERD information provided      Notice: This dictation was prepared with Dragon dictation along with smaller phrase technology. Any transcriptional errors that result from this process are unintentional and may not be corrected upon review.

## 2021-05-30 NOTE — Patient Instructions (Signed)
Continue Protonix 40 mg twice daily  We will see you back in 1 year and as needed  GERD information provided

## 2021-06-02 ENCOUNTER — Ambulatory Visit: Payer: Medicare Other | Admitting: Cardiology

## 2021-06-07 ENCOUNTER — Encounter (HOSPITAL_COMMUNITY): Payer: Self-pay | Admitting: Emergency Medicine

## 2021-06-07 ENCOUNTER — Emergency Department (HOSPITAL_COMMUNITY)
Admission: EM | Admit: 2021-06-07 | Discharge: 2021-06-07 | Disposition: A | Discharge status: Home / Self Care | Payer: Medicare Other | Attending: Emergency Medicine | Admitting: Emergency Medicine

## 2021-06-07 ENCOUNTER — Emergency Department (HOSPITAL_COMMUNITY): Payer: Medicare Other

## 2021-06-07 ENCOUNTER — Other Ambulatory Visit: Payer: Self-pay

## 2021-06-07 DIAGNOSIS — R55 Syncope and collapse: Secondary | ICD-10-CM | POA: Insufficient documentation

## 2021-06-07 DIAGNOSIS — I2510 Atherosclerotic heart disease of native coronary artery without angina pectoris: Secondary | ICD-10-CM | POA: Insufficient documentation

## 2021-06-07 DIAGNOSIS — Z7901 Long term (current) use of anticoagulants: Secondary | ICD-10-CM | POA: Insufficient documentation

## 2021-06-07 DIAGNOSIS — E1142 Type 2 diabetes mellitus with diabetic polyneuropathy: Secondary | ICD-10-CM | POA: Insufficient documentation

## 2021-06-07 DIAGNOSIS — Z87891 Personal history of nicotine dependence: Secondary | ICD-10-CM | POA: Diagnosis not present

## 2021-06-07 DIAGNOSIS — R0602 Shortness of breath: Secondary | ICD-10-CM | POA: Diagnosis not present

## 2021-06-07 DIAGNOSIS — Z79899 Other long term (current) drug therapy: Secondary | ICD-10-CM | POA: Diagnosis not present

## 2021-06-07 DIAGNOSIS — E860 Dehydration: Secondary | ICD-10-CM | POA: Insufficient documentation

## 2021-06-07 DIAGNOSIS — Z8679 Personal history of other diseases of the circulatory system: Secondary | ICD-10-CM | POA: Diagnosis not present

## 2021-06-07 DIAGNOSIS — Z7984 Long term (current) use of oral hypoglycemic drugs: Secondary | ICD-10-CM | POA: Insufficient documentation

## 2021-06-07 DIAGNOSIS — R4182 Altered mental status, unspecified: Secondary | ICD-10-CM | POA: Diagnosis not present

## 2021-06-07 LAB — CBC WITH DIFFERENTIAL/PLATELET
Abs Immature Granulocytes: 0.02 10*3/uL (ref 0.00–0.07)
Basophils Absolute: 0.1 10*3/uL (ref 0.0–0.1)
Basophils Relative: 1 %
Eosinophils Absolute: 0.2 10*3/uL (ref 0.0–0.5)
Eosinophils Relative: 3 %
HCT: 43.8 % (ref 39.0–52.0)
Hemoglobin: 14.9 g/dL (ref 13.0–17.0)
Immature Granulocytes: 0 %
Lymphocytes Relative: 15 %
Lymphs Abs: 1.1 10*3/uL (ref 0.7–4.0)
MCH: 31.4 pg (ref 26.0–34.0)
MCHC: 34 g/dL (ref 30.0–36.0)
MCV: 92.4 fL (ref 80.0–100.0)
Monocytes Absolute: 0.6 10*3/uL (ref 0.1–1.0)
Monocytes Relative: 9 %
Neutro Abs: 4.9 10*3/uL (ref 1.7–7.7)
Neutrophils Relative %: 72 %
Platelets: 190 10*3/uL (ref 150–400)
RBC: 4.74 MIL/uL (ref 4.22–5.81)
RDW: 13.6 % (ref 11.5–15.5)
WBC: 6.8 10*3/uL (ref 4.0–10.5)
nRBC: 0 % (ref 0.0–0.2)

## 2021-06-07 LAB — URINALYSIS, ROUTINE W REFLEX MICROSCOPIC
Bacteria, UA: NONE SEEN
Bilirubin Urine: NEGATIVE
Glucose, UA: NEGATIVE mg/dL
Ketones, ur: NEGATIVE mg/dL
Leukocytes,Ua: NEGATIVE
Nitrite: NEGATIVE
Protein, ur: NEGATIVE mg/dL
Specific Gravity, Urine: 1.005 (ref 1.005–1.030)
pH: 6 (ref 5.0–8.0)

## 2021-06-07 LAB — PROTIME-INR
INR: 2.9 — ABNORMAL HIGH (ref 0.8–1.2)
Prothrombin Time: 30.1 seconds — ABNORMAL HIGH (ref 11.4–15.2)

## 2021-06-07 LAB — COMPREHENSIVE METABOLIC PANEL
ALT: 21 U/L (ref 0–44)
AST: 21 U/L (ref 15–41)
Albumin: 4.2 g/dL (ref 3.5–5.0)
Alkaline Phosphatase: 75 U/L (ref 38–126)
Anion gap: 10 (ref 5–15)
BUN: 12 mg/dL (ref 8–23)
CO2: 26 mmol/L (ref 22–32)
Calcium: 9.3 mg/dL (ref 8.9–10.3)
Chloride: 103 mmol/L (ref 98–111)
Creatinine, Ser: 1.02 mg/dL (ref 0.61–1.24)
GFR, Estimated: >60 mL/min (ref >60)
Glucose, Bld: 152 mg/dL — ABNORMAL HIGH (ref 70–99)
Potassium: 4 mmol/L (ref 3.5–5.1)
Sodium: 139 mmol/L (ref 135–145)
Total Bilirubin: 1 mg/dL (ref 0.3–1.2)
Total Protein: 6.9 g/dL (ref 6.5–8.1)

## 2021-06-07 LAB — CBG MONITORING, ED: Glucose-Capillary: 156 mg/dL — ABNORMAL HIGH (ref 70–99)

## 2021-06-07 MED ORDER — SODIUM CHLORIDE 0.9 % IV SOLN: INTRAVENOUS | Status: DC

## 2021-06-07 MED ORDER — SODIUM CHLORIDE 0.9 % IV BOLUS
1000.0000 mL | Freq: Once | INTRAVENOUS | Status: AC
  Administered 2021-06-07: 1000 mL via INTRAVENOUS

## 2021-06-07 MED ORDER — SODIUM CHLORIDE 0.9 % IV BOLUS
500.0000 mL | Freq: Once | INTRAVENOUS | Status: AC
  Administered 2021-06-07: 500 mL via INTRAVENOUS

## 2021-06-07 NOTE — ED Notes (Signed)
Pt was visiting wife at hospital upstairs on 300 when he had a near syncopal episode witnessed by palliative np and son. Pt did not fully lose consciousness and did not hit head. He was moderately altered in mental status following the event on arrival to ED and asked what had happened after getting him hooked up into the bed. He is now alert and oriented x4. States he has a hx of Afib and has had similar episodes like this in the past. Son is at bedside. CBG 156. Pt is on blood thinners. He does not report any recent falls and no active bleeding.

## 2021-06-07 NOTE — ED Provider Notes (Signed)
[facility] EMERGENCY DEPARTMENT Provider Note   CSN: [id number] Arrival date & time: 06/07/21  1337     History Chief Complaint  Patient presents with   Loss of Consciousness    [name] is a 80 y.o. male.  Pt presents to the ED today with a syncopal event.  The pt's wife is upstairs in the hospital.  She is doing very poorly.  She's been in the hospital for a week and pt has been with her for most of the time.  He has not been sleeping or eating well.  He ate cereal this am, but has not eaten anything else.  Pt had a near syncopal event upstairs.  He is on coumadin for afib.   He did not hit his head.  Pt's family said he's also been falling asleep frequently.  That has been going on for months.  Pt denies any pain.      Past Medical History:  Diagnosis Date   Aortic atherosclerosis ([place]) 07/29/2020   Incidental CT finding.    Barrett's esophagus    short-segment, diagnosed in 2007 by Dr. [name]   BPH (benign prostatic hyperplasia)    CAD (coronary artery disease)    a. cath on 07/21/2019 showing patent LM and LCx with 40-60% Proximal LAD and 30-40% Pro-RCA stenosis with normal LV function.   Diabetes mellitus without complication (HCC)    type 1    Dyspnea    with exertion    GERD (gastroesophageal reflux disease)    Hiatal hernia    small   History of kidney stones    HTN (hypertension)    PAF (paroxysmal atrial fibrillation) ([place])    S/P colonoscopy 2007   unremarkable   S/P endoscopy Oct 2011    Salmon-colored epithelium coming up to 37 cm from the    Vertigo     Patient Active Problem List   Diagnosis Date Noted   DOE (dyspnea on exertion) 06/28/2020   Long-term current use of opiate analgesic 02/10/2020   Arthritis of right sacroiliac joint 02/08/2020   Lumbar radiculopathy 02/08/2020   Polyneuropathy due to type 2 diabetes mellitus ([place]) 02/08/2020   Restless legs 02/08/2020   Chronic low back pain 02/08/2020   Polyarthropathy 02/08/2020   Fatigue  10/19/2019   H/O adenomatous polyp of colon 10/19/2019   Abnormal cardiac CT angiography    Encounter for therapeutic drug monitoring 07/02/2018   Atrial fibrillation ([place]) 06/24/2018   DIZZINESS 05/09/2010   Angina pectoris ([place]) 05/09/2010   GERD 01/31/2009   BARRETTS ESOPHAGUS 01/31/2009    Past Surgical History:  Procedure Laterality Date   APPENDECTOMY     BIOPSY  01/28/2020   Procedure: BIOPSY;  Surgeon: [name], MD;  [facility];  Service: Endoscopy;;  esophagus   CHOLECYSTECTOMY     COLONOSCOPY  02/06/2006   Dr. [name]- single anal papilla, o/w normal rectum, normal colon   COLONOSCOPY N/A 09/13/2016   RMR: three 5-6 mm for adenomas removed.  Recommend one last surveillance colonoscopy in October 2020.   COLONOSCOPY WITH PROPOFOL N/A 01/28/2020   Procedure: COLONOSCOPY WITH PROPOFOL;  Surgeon: [name], MD;  [facility];  Service: Endoscopy;  Laterality: N/A;  10:45am   CYSTOSCOPY WITH RETROGRADE PYELOGRAM, URETEROSCOPY AND STENT PLACEMENT Right 08/18/2020   Procedure: CYSTOSCOPY WITH INCISION OF RIGHT URETEROCELE, URETEROSCOPY WITH HOLMIUM LASER, CYSTOLITHOLAPAXY OF 10 MM BLADDER STONE;  Surgeon: [name], MD;  [facility];  Service: Urology;  Laterality: Right;   ESOPHAGOGASTRODUODENOSCOPY  09/15/2010   Dr. [name]- Salmon-colored epithelium coming up to 37 cm from the    ESOPHAGOGASTRODUODENOSCOPY N/A 07/02/2013   Dr. [name]- Barretts on bx. hiatal hernia   ESOPHAGOGASTRODUODENOSCOPY N/A 09/13/2016   RMR: Barrett's esophagus without dysplasia. one last EGD in 3 years.    ESOPHAGOGASTRODUODENOSCOPY (EGD) WITH PROPOFOL N/A 01/28/2020   Procedure: ESOPHAGOGASTRODUODENOSCOPY (EGD) WITH PROPOFOL;  Surgeon: [name], MD;  [facility];  Service: Endoscopy;  Laterality: N/A;   INTRAVASCULAR PRESSURE WIRE/FFR STUDY N/A 07/21/2019   Procedure: INTRAVASCULAR PRESSURE WIRE/FFR STUDY;  Surgeon: [name], MD;  Location: [place] [facility];  Service: Cardiovascular;  Laterality: N/A;   LEFT HEART CATH AND CORONARY ANGIOGRAPHY N/A 07/21/2019   Procedure: LEFT HEART CATH AND CORONARY ANGIOGRAPHY;  Surgeon: [name], MD;  Location: [place] [facility];  Service: Cardiovascular;  Laterality: N/A;   POLYPECTOMY  01/28/2020   Procedure: POLYPECTOMY;  Surgeon: [name], MD;  [facility];  Service: Endoscopy;;  colon   ROTATOR CUFF REPAIR     YAG LASER APPLICATION Right 11/24/9676   Procedure: YAG LASER APPLICATION;  Surgeon: [name], MD;  [facility];  Service: Ophthalmology;  Laterality: Right;       Family History  Problem Relation Age of Onset   Prostate cancer Father        deceased   Stroke Mother        deceased   Colon cancer Neg Hx     Social History   Tobacco Use   Smoking status: Former    Packs/day: 1.50    Years: 30.00    Pack years: 45.00    Types: Cigarettes    Quit date: 11/18/1993    Years since quitting: 27.5   Smokeless tobacco: Never   Tobacco comments:    quit in the 90's  Vaping Use   Vaping Use: Never used  Substance Use Topics   Alcohol use: No   Drug use: No    Home Medications Prior to Admission medications   Medication Sig Start Date End Date Taking? Authorizing Provider  Accu-Chek FastClix Lancets MISC 1 application by Does not apply route 2 (two) times daily. 08/23/20   Brita Romp, NP  acetaminophen (TYLENOL) 500 MG tablet Take 1,000 mg by mouth every 6 (six) hours as needed for moderate pain.  Patient not taking: Reported on 05/30/2021    [provider]  allopurinol (ZYLOPRIM) 100 MG tablet Take 100 mg by mouth daily.  02/12/19   [provider]  ALPRAZolam Duanne Moron) 1 MG tablet Take 0.5 mg by mouth at bedtime as needed for anxiety or sleep.  Patient not taking: Reported on 05/30/2021    [provider]  atorvastatin (LIPITOR) 40 MG tablet Take 1 tablet by mouth daily. 02/14/21   [provider]   Cholecalciferol (VITAMIN D3) 50 MCG (2000 UT) TABS Take 2,000 Units by mouth daily.    [provider]  DULoxetine (CYMBALTA) 60 MG capsule Take 60 mg by mouth daily. 10/20/20   [provider]  fexofenadine (ALLEGRA) 180 MG tablet Take 180 mg by mouth daily.    [provider]  finasteride (PROSCAR) 5 MG tablet Take 1 tablet (5 mg total) by mouth daily. 02/02/21   [name], MD  gabapentin (NEURONTIN) 300 MG capsule Take 300 mg by mouth 2 (two) times daily.     [provider]  glucose blood  test strip 1 each by Other route daily before breakfast. Use as instructed 09/26/20   Brita Romp, NP  HYDROcodone-acetaminophen ([place]) 7.5-325 MG tablet as needed.    [provider]  metFORMIN (GLUCOPHAGE XR) 500 MG 24 hr tablet Take 1 tablet (500 mg total) by mouth daily with breakfast. 08/30/20   Brita Romp, NP  pantoprazole (PROTONIX) 40 MG tablet TAKE ONE TABLET (40MG  TOTAL) BY MOUTH TWO TIMES DAILY BEFORE A MEAL 01/02/21   Mahala Menghini, PA-C  tamsulosin (FLOMAX) 0.4 MG CAPS capsule Take 2 capsules (0.8 mg total) by mouth 2 (two) times daily. 09/02/20   [name], MD  traMADol (ULTRAM) 50 MG tablet Take 1 tablet (50 mg total) by mouth every 6 (six) hours as needed for moderate pain (pain.). Patient taking differently: Take 50 mg by mouth as needed for moderate pain (pain.). 08/18/20   [name], MD  warfarin (COUMADIN) 5 MG tablet Take 1 tablet daily except 1 1/2 tablets on Saturday or as directed 03/21/21   Verta Ellen., NP    Allergies    Patient has no known allergies.  Review of Systems   Review of Systems  Neurological:  Positive for syncope.  All other systems reviewed and are negative.  Physical Exam Updated Vital Signs BP (!) 156/78   Pulse 60   Temp 97.6 F (36.4 C) (Oral)   Resp (!) 9   Ht 5\' 11"  (1.803 m)   Wt 96.6 kg   SpO2 100%   BMI 29.71 kg/m   Physical Exam Vitals and nursing note reviewed.   Constitutional:      Appearance: Normal appearance.  HENT:     Head: Normocephalic and atraumatic.     Right Ear: External ear normal.     Left Ear: External ear normal.     Nose: Nose normal.     Mouth/Throat:     Mouth: Mucous membranes are moist.     Pharynx: Oropharynx is clear.  Eyes:     Extraocular Movements: Extraocular movements intact.     Conjunctiva/sclera: Conjunctivae normal.     Pupils: Pupils are equal, round, and reactive to light.  Cardiovascular:     Rate and Rhythm: Normal rate and regular rhythm.     Pulses: Normal pulses.     Heart sounds: Normal heart sounds.  Pulmonary:     Effort: Pulmonary effort is normal.     Breath sounds: Normal breath sounds.  Abdominal:     General: Abdomen is flat. Bowel sounds are normal.     Palpations: Abdomen is soft.  Musculoskeletal:        General: Normal range of motion.     Cervical back: Normal range of motion and neck supple.  Skin:    General: Skin is warm.     Capillary Refill: Capillary refill takes less than 2 seconds.  Neurological:     General: No focal deficit present.     Mental Status: He is alert and oriented to person, place, and time.  Psychiatric:        Mood and Affect: Mood normal.        Behavior: Behavior normal.        Thought Content: Thought content normal.        Judgment: Judgment normal.    ED Results / Procedures / Treatments   Labs (all labs ordered are listed, but only abnormal results are displayed) Labs Reviewed  COMPREHENSIVE METABOLIC PANEL - Abnormal; Notable for  the following components:      Result Value   Glucose, Bld 152 (*)    All other components within normal limits  PROTIME-INR - Abnormal; Notable for the following components:   Prothrombin Time 30.1 (*)    INR 2.9 (*)    All other components within normal limits  URINALYSIS, ROUTINE W REFLEX MICROSCOPIC - Abnormal; Notable for the following components:   Color, Urine STRAW (*)    Hgb urine dipstick SMALL (*)     All other components within normal limits  CBG MONITORING, ED - Abnormal; Notable for the following components:   Glucose-Capillary 156 (*)    All other components within normal limits  CBC WITH DIFFERENTIAL/PLATELET    EKG EKG Interpretation  Date/Time:  Wednesday June 07 2021 13:43:31 EDT Ventricular Rate:  55 PR Interval:  247 QRS Duration: 105 QT Interval:  429 QTC Calculation: 411 R Axis:   14 Text Interpretation: Sinus or ectopic atrial rhythm Prolonged PR interval Low voltage, precordial leads Abnormal R-wave progression, early transition Baseline wander in lead(s) V4 Confirmed by Isla Pence [phone]) on 06/07/2021 3:04:12 PM  Radiology DG Chest 1 View  Result Date: 06/07/2021 CLINICAL DATA:  Recent syncopal episode with shortness of breath EXAM: CHEST  1 VIEW COMPARISON:  03/01/2021 FINDINGS: Cardiac shadow remains enlarged. Aortic calcifications are noted. The lungs are clear bilaterally. No acute bony abnormality is seen. IMPRESSION: No active disease. Electronically Signed   By: Inez Catalina M.D.   On: 06/07/2021 15:47   CT Head Wo Contrast  Result Date: 06/07/2021 CLINICAL DATA:  Altered mental status following near syncopal episode. EXAM: CT HEAD WITHOUT CONTRAST TECHNIQUE: Contiguous axial images were obtained from the base of the skull through the vertex without intravenous contrast. COMPARISON:  None. FINDINGS: Brain: No evidence of acute infarction, hemorrhage, hydrocephalus, extra-axial collection or mass lesion/mass effect. Mild atrophic changes are noted. Vascular: No hyperdense vessel or unexpected calcification. Skull: Normal. Negative for fracture or focal lesion. Sinuses/Orbits: No acute finding. Other: None. IMPRESSION: Mild atrophic changes without acute abnormality. Electronically Signed   By: Inez Catalina M.D.   On: 06/07/2021 15:46    Procedures Procedures   Medications Ordered in ED Medications  sodium chloride 0.9 % bolus 1,000 mL (0 mLs Intravenous  Stopped 06/07/21 1812)    And  0.9 %  sodium chloride infusion ( Intravenous Stopped 06/07/21 1814)  sodium chloride 0.9 % bolus 500 mL (500 mLs Intravenous New Bag/Given 06/07/21 1816)    ED Course  I have reviewed the triage vital signs and the nursing notes.  Pertinent labs & imaging results that were available during my care of the patient were reviewed by me and considered in my medical decision making (see chart for details).    MDM Rules/Calculators/A&P                           Eval is negative other than some dehydration.  Pt is encouraged to make sure he is sleeping and eating and taking care of himself.  As far as the falling asleep issue goes; I think pt needs a sleep study once things are a little more stable with his wife.  Pt is stable for d/c. Return if worse.   Final Clinical Impression(s) / ED Diagnoses Final diagnoses:  Near syncope  Dehydration    Rx / DC Orders ED Discharge Orders     None        Isla Pence,  MD 06/07/21 3007

## 2021-06-07 NOTE — ED Triage Notes (Signed)
Per staff pt was visiting his wife who is admitted to the hospital; palliative care nurse at bedside with her when pt had a syncopal episode-reports pt was unresponsive but came around; pt reports dizziness and feeling cold; CBG 141; BP 155/86; Resp 16; O2 99%; HR 76; pt denies pain

## 2021-06-18 DIAGNOSIS — E782 Mixed hyperlipidemia: Secondary | ICD-10-CM | POA: Diagnosis not present

## 2021-06-18 DIAGNOSIS — E119 Type 2 diabetes mellitus without complications: Secondary | ICD-10-CM | POA: Diagnosis not present

## 2021-06-18 DIAGNOSIS — I480 Paroxysmal atrial fibrillation: Secondary | ICD-10-CM | POA: Diagnosis not present

## 2021-06-26 ENCOUNTER — Ambulatory Visit: Payer: Medicare Other | Admitting: Nurse Practitioner

## 2021-06-27 ENCOUNTER — Ambulatory Visit (INDEPENDENT_AMBULATORY_CARE_PROVIDER_SITE_OTHER): Payer: Medicare Other | Admitting: *Deleted

## 2021-06-27 DIAGNOSIS — Z5181 Encounter for therapeutic drug level monitoring: Secondary | ICD-10-CM | POA: Diagnosis not present

## 2021-06-27 DIAGNOSIS — I4891 Unspecified atrial fibrillation: Secondary | ICD-10-CM | POA: Diagnosis not present

## 2021-06-27 LAB — POCT INR: INR: 3.3 — AB (ref 2.0–3.0)

## 2021-06-27 NOTE — Patient Instructions (Signed)
Hold warfarin tonight then resume 1 tablet daily except 1 1/2 tablets on Saturdays Recheck INR in 4 wk Call us with any new medications, changes or any bleeding concerns @ [phone].

## 2021-07-28 ENCOUNTER — Telehealth: Payer: Self-pay | Admitting: Cardiology

## 2021-07-28 NOTE — Telephone Encounter (Signed)
Spoke with patient.  Condolences expressed.  INR appt made for 9/15 at 2:15pm.  Pt appreciative.

## 2021-07-28 NOTE — Telephone Encounter (Signed)
New message    Please call patient thinks he missed a coumadin appointment.  His wife passed away and he is unsure of when he is due to come back

## 2021-07-31 ENCOUNTER — Other Ambulatory Visit: Payer: Self-pay | Admitting: Gastroenterology

## 2021-08-02 ENCOUNTER — Ambulatory Visit: Payer: Medicare Other | Admitting: Nurse Practitioner

## 2021-08-08 NOTE — Patient Instructions (Signed)
Diabetes Mellitus and Nutrition, Adult When you have diabetes, or diabetes mellitus, it is very important to have healthy eating habits because your blood sugar (glucose) levels are greatly affected by what you eat and drink. Eating healthy foods in the right amounts, at about the same times every day, can help you:  Control your blood glucose.  Lower your risk of heart disease.  Improve your blood pressure.  Reach or maintain a healthy weight. What can affect my meal plan? Every person with diabetes is different, and each person has different needs for a meal plan. Your health care provider may recommend that you work with a dietitian to make a meal plan that is best for you. Your meal plan may vary depending on factors such as:  The calories you need.  The medicines you take.  Your weight.  Your blood glucose, blood pressure, and cholesterol levels.  Your activity level.  Other health conditions you have, such as heart or kidney disease. How do carbohydrates affect me? Carbohydrates, also called carbs, affect your blood glucose level more than any other type of food. Eating carbs naturally raises the amount of glucose in your blood. Carb counting is a method for keeping track of how many carbs you eat. Counting carbs is important to keep your blood glucose at a healthy level, especially if you use insulin or take certain oral diabetes medicines. It is important to know how many carbs you can safely have in each meal. This is different for every person. Your dietitian can help you calculate how many carbs you should have at each meal and for each snack. How does alcohol affect me? Alcohol can cause a sudden decrease in blood glucose (hypoglycemia), especially if you use insulin or take certain oral diabetes medicines. Hypoglycemia can be a life-threatening condition. Symptoms of hypoglycemia, such as sleepiness, dizziness, and confusion, are similar to symptoms of having too much  alcohol.  Do not drink alcohol if: ? Your health care provider tells you not to drink. ? You are pregnant, may be pregnant, or are planning to become pregnant.  If you drink alcohol: ? Do not drink on an empty stomach. ? Limit how much you use to:  0-1 drink a day for women.  0-2 drinks a day for men. ? Be aware of how much alcohol is in your drink. In the [place] one drink equals one 12 oz bottle of beer (355 mL), one 5 oz glass of wine (148 mL), or one 1 oz glass of hard liquor (44 mL). ? Keep yourself hydrated with water, diet soda, or unsweetened iced tea.  Keep in mind that regular soda, juice, and other mixers may contain a lot of sugar and must be counted as carbs. What are tips for following this plan? Reading food labels  Start by checking the serving size on the "Nutrition Facts" label of packaged foods and drinks. The amount of calories, carbs, fats, and other nutrients listed on the label is based on one serving of the item. Many items contain more than one serving per package.  Check the total grams (g) of carbs in one serving. You can calculate the number of servings of carbs in one serving by dividing the total carbs by 15. For example, if a food has 30 g of total carbs per serving, it would be equal to 2 servings of carbs.  Check the number of grams (g) of saturated fats and trans fats in one serving. Choose foods that have   a low amount or none of these fats.  Check the number of milligrams (mg) of salt (sodium) in one serving. Most people should limit total sodium intake to less than 2,300 mg per day.  Always check the nutrition information of foods labeled as "low-fat" or "nonfat." These foods may be higher in added sugar or refined carbs and should be avoided.  Talk to your dietitian to identify your daily goals for nutrients listed on the label. Shopping  Avoid buying canned, pre-made, or processed foods. These foods tend to be high in fat, sodium, and added  sugar.  Shop around the outside edge of the grocery store. This is where you will most often find fresh fruits and vegetables, bulk grains, fresh meats, and fresh dairy. Cooking  Use low-heat cooking methods, such as baking, instead of high-heat cooking methods like deep frying.  Cook using healthy oils, such as olive, canola, or sunflower oil.  Avoid cooking with butter, cream, or high-fat meats. Meal planning  Eat meals and snacks regularly, preferably at the same times every day. Avoid going long periods of time without eating.  Eat foods that are high in fiber, such as fresh fruits, vegetables, beans, and whole grains. Talk with your dietitian about how many servings of carbs you can eat at each meal.  Eat 4-6 oz (112-168 g) of lean protein each day, such as lean meat, chicken, fish, eggs, or tofu. One ounce (oz) of lean protein is equal to: ? 1 oz (28 g) of meat, chicken, or fish. ? 1 egg. ?  cup (62 g) of tofu.  Eat some foods each day that contain healthy fats, such as avocado, nuts, seeds, and fish.   What foods should I eat? Fruits Berries. Apples. Oranges. Peaches. Apricots. Plums. Grapes. Mango. Papaya. Pomegranate. Kiwi. Cherries. Vegetables Lettuce. Spinach. Leafy greens, including kale, chard, collard greens, and mustard greens. Beets. Cauliflower. Cabbage. Broccoli. Carrots. Green beans. Tomatoes. Peppers. Onions. Cucumbers. Brussels sprouts. Grains Whole grains, such as whole-wheat or whole-grain bread, crackers, tortillas, cereal, and pasta. Unsweetened oatmeal. Quinoa. Brown or wild rice. Meats and other proteins Seafood. Poultry without skin. Lean cuts of poultry and beef. Tofu. Nuts. Seeds. Dairy Low-fat or fat-free dairy products such as milk, yogurt, and cheese. The items listed above may not be a complete list of foods and beverages you can eat. Contact a dietitian for more information. What foods should I avoid? Fruits Fruits canned with  syrup. Vegetables Canned vegetables. Frozen vegetables with butter or cream sauce. Grains Refined white flour and flour products such as bread, pasta, snack foods, and cereals. Avoid all processed foods. Meats and other proteins Fatty cuts of meat. Poultry with skin. Breaded or fried meats. Processed meat. Avoid saturated fats. Dairy Full-fat yogurt, cheese, or milk. Beverages Sweetened drinks, such as soda or iced tea. The items listed above may not be a complete list of foods and beverages you should avoid. Contact a dietitian for more information. Questions to ask a health care provider  Do I need to meet with a diabetes educator?  Do I need to meet with a dietitian?  What number can I call if I have questions?  When are the best times to check my blood glucose? Where to find more information:  American Diabetes Association: diabetes.org  Academy of Nutrition and Dietetics: www.eatright.org  National Institute of Diabetes and Digestive and Kidney Diseases: www.niddk.nih.gov  Association of Diabetes Care and Education Specialists: www.diabeteseducator.org Summary  It is important to have healthy eating   habits because your blood sugar (glucose) levels are greatly affected by what you eat and drink.  A healthy meal plan will help you control your blood glucose and maintain a healthy lifestyle.  Your health care provider may recommend that you work with a dietitian to make a meal plan that is best for you.  Keep in mind that carbohydrates (carbs) and alcohol have immediate effects on your blood glucose levels. It is important to count carbs and to use alcohol carefully. This information is not intended to replace advice given to you by your health care provider. Make sure you discuss any questions you have with your health care provider. Document Revised: 10/13/2019 Document Reviewed: 10/13/2019 Elsevier Patient Education  2021 Elsevier Inc.  

## 2021-08-09 ENCOUNTER — Ambulatory Visit (INDEPENDENT_AMBULATORY_CARE_PROVIDER_SITE_OTHER): Payer: Medicare Other | Admitting: Nurse Practitioner

## 2021-08-09 ENCOUNTER — Encounter: Payer: Self-pay | Admitting: Nurse Practitioner

## 2021-08-09 ENCOUNTER — Other Ambulatory Visit: Payer: Self-pay

## 2021-08-09 VITALS — BP 111/61 | HR 59 | Ht 71.0 in | Wt 214.0 lb

## 2021-08-09 DIAGNOSIS — E1165 Type 2 diabetes mellitus with hyperglycemia: Secondary | ICD-10-CM | POA: Diagnosis not present

## 2021-08-09 LAB — POCT GLYCOSYLATED HEMOGLOBIN (HGB A1C): HbA1c, POC (controlled diabetic range): 7.6 % — AB (ref 0.0–7.0)

## 2021-08-09 NOTE — Progress Notes (Signed)
Endocrinology Follow Up Visit      08/09/2021, 3:12 PM      Subjective:    Patient ID: [name], male    DOB: 26-Jun-1941.  [name] is being seen in follow up after being seen in consultation for management of currently uncontrolled symptomatic diabetes requested by  Redmond School, MD.  He is a retired Engineer, structural for [employer].   Past Medical History:  Diagnosis Date   Aortic atherosclerosis ([place]) 07/29/2020   Incidental CT finding.    Barrett's esophagus    short-segment, diagnosed in 2007 by Dr. [name]   BPH (benign prostatic hyperplasia)    CAD (coronary artery disease)    a. cath on 07/21/2019 showing patent LM and LCx with 40-60% Proximal LAD and 30-40% Pro-RCA stenosis with normal LV function.   Diabetes mellitus without complication (HCC)    type 1    Dyspnea    with exertion    GERD (gastroesophageal reflux disease)    Hiatal hernia    small   History of kidney stones    HTN (hypertension)    PAF (paroxysmal atrial fibrillation) ([place])    S/P colonoscopy 2007   unremarkable   S/P endoscopy Oct 2011    Salmon-colored epithelium coming up to 37 cm from the    Vertigo     Past Surgical History:  Procedure Laterality Date   APPENDECTOMY     BIOPSY  01/28/2020   Procedure: BIOPSY;  Surgeon: [name], MD;  [facility]: [facility];  Service: Endoscopy;;  esophagus   CHOLECYSTECTOMY     COLONOSCOPY  02/06/2006   Dr. [name]- single anal papilla, o/w normal rectum, normal colon   COLONOSCOPY N/A 09/13/2016   RMR: three 5-6 mm for adenomas removed.  Recommend one last surveillance colonoscopy in October 2020.   COLONOSCOPY WITH PROPOFOL N/A 01/28/2020   Procedure: COLONOSCOPY WITH PROPOFOL;  Surgeon: [name], MD;  Location: [facility];  Service: Endoscopy;  Laterality: N/A;  10:45am   CYSTOSCOPY WITH RETROGRADE PYELOGRAM, URETEROSCOPY AND  STENT PLACEMENT Right 08/18/2020   Procedure: CYSTOSCOPY WITH INCISION OF RIGHT URETEROCELE, URETEROSCOPY WITH HOLMIUM LASER, CYSTOLITHOLAPAXY OF 10 MM BLADDER STONE;  Surgeon: [name], MD;  Location: [facility];  Service: Urology;  Laterality: Right;   ESOPHAGOGASTRODUODENOSCOPY  09/15/2010   Dr. [name]- Salmon-colored epithelium coming up to 37 cm from the    ESOPHAGOGASTRODUODENOSCOPY N/A 07/02/2013   Dr. [name]- Barretts on bx. hiatal hernia   ESOPHAGOGASTRODUODENOSCOPY N/A 09/13/2016   RMR: Barrett's esophagus without dysplasia. one last EGD in 3 years.    ESOPHAGOGASTRODUODENOSCOPY (EGD) WITH PROPOFOL N/A 01/28/2020   Procedure: ESOPHAGOGASTRODUODENOSCOPY (EGD) WITH PROPOFOL;  Surgeon: [name], MD;  [facility];  Service: Endoscopy;  Laterality: N/A;   INTRAVASCULAR PRESSURE WIRE/FFR STUDY N/A 07/21/2019   Procedure: INTRAVASCULAR PRESSURE WIRE/FFR STUDY;  Surgeon: [name], MD;  Location: [facility];  Service: Cardiovascular;  Laterality: N/A;   LEFT HEART CATH AND CORONARY ANGIOGRAPHY N/A 07/21/2019   Procedure: LEFT HEART CATH AND CORONARY ANGIOGRAPHY;  Surgeon: [name], MD;  Location:  [place] [facility];  Service: Cardiovascular;  Laterality: N/A;   POLYPECTOMY  01/28/2020   Procedure: POLYPECTOMY;  Surgeon: [name], MD;  [facility];  Service: Endoscopy;;  colon   ROTATOR CUFF REPAIR     YAG LASER APPLICATION Right 11/25/4942   Procedure: YAG LASER APPLICATION;  Surgeon: [name], MD;  Location: [facility];  Service: Ophthalmology;  Laterality: Right;    Social History   Socioeconomic History   Marital status: Married    Spouse name: Not on file   Number of children: Not on file   Years of education: Not on file   Highest education level: Not on file  Occupational History   Not on file  Tobacco Use   Smoking status: Former    Packs/day: 1.50    Years: 30.00    Pack years: 45.00    Types: Cigarettes    Quit date:  11/18/1993    Years since quitting: 27.7   Smokeless tobacco: Never   Tobacco comments:    quit in the 90's  Vaping Use   Vaping Use: Never used  Substance and Sexual Activity   Alcohol use: No   Drug use: No   Sexual activity: Not on file  Other Topics Concern   Not on file  Social History Narrative   Not on file   Social Determinants of Health   Financial Resource Strain: Not on file  Food Insecurity: Not on file  Transportation Needs: Not on file  Physical Activity: Not on file  Stress: Not on file  Social Connections: Not on file    Family History  Problem Relation Age of Onset   Prostate cancer Father        deceased   Stroke Mother        deceased   Colon cancer Neg Hx     Outpatient Encounter Medications as of 08/09/2021  Medication Sig   Accu-Chek FastClix Lancets MISC 1 application by Does not apply route 2 (two) times daily.   allopurinol (ZYLOPRIM) 100 MG tablet Take 100 mg by mouth daily.    ALPRAZolam (XANAX) 1 MG tablet Take 0.5 mg by mouth at bedtime as needed for anxiety or sleep.   Cholecalciferol (VITAMIN D3) 50 MCG (2000 UT) TABS Take 2,000 Units by mouth daily.   DULoxetine (CYMBALTA) 60 MG capsule Take 60 mg by mouth daily.   fexofenadine (ALLEGRA) 180 MG tablet Take 180 mg by mouth daily.   finasteride (PROSCAR) 5 MG tablet Take 1 tablet (5 mg total) by mouth daily.   gabapentin (NEURONTIN) 300 MG capsule Take 300 mg by mouth 2 (two) times daily.    glipiZIDE (GLUCOTROL) 5 MG tablet Take 5 mg by mouth daily.   glucose blood test strip 1 each by Other route daily before breakfast. Use as instructed   HYDROcodone-acetaminophen (NORCO/VICODIN) 5-325 MG tablet Take 1 tablet by mouth 4 (four) times daily as needed.   metFORMIN (GLUCOPHAGE XR) 500 MG 24 hr tablet Take 1 tablet (500 mg total) by mouth daily with breakfast.   pantoprazole (PROTONIX) 40 MG tablet TAKE ONE TABLET (40MG  TOTAL) BY MOUTH TWO TIMES DAILY BEFORE A MEAL   tamsulosin (FLOMAX) 0.4  MG CAPS capsule Take 2 capsules (0.8 mg total) by mouth 2 (two) times daily.   traMADol (ULTRAM) 50 MG tablet Take 1 tablet (50 mg total) by mouth every 6 (six) hours as needed for moderate pain (pain.). (Patient taking differently: Take 50 mg by mouth  as needed for moderate pain (pain.).)   warfarin (COUMADIN) 5 MG tablet Take 1 tablet daily except 1 1/2 tablets on Saturday or as directed   acetaminophen (TYLENOL) 500 MG tablet Take 1,000 mg by mouth every 6 (six) hours as needed for moderate pain.  (Patient not taking: Reported on 08/09/2021)   [DISCONTINUED] atorvastatin (LIPITOR) 40 MG tablet Take 1 tablet by mouth daily. (Patient not taking: Reported on 08/09/2021)   [DISCONTINUED] HYDROcodone-acetaminophen (NORCO) 7.5-325 MG tablet as needed. (Patient not taking: Reported on 08/09/2021)   No facility-administered encounter medications on file as of 08/09/2021.    ALLERGIES: No Known Allergies  VACCINATION STATUS: Immunization History  Administered Date(s) Administered   Influenza-Unspecified 09/19/2013    Diabetes He presents for his follow-up diabetic visit. He has type 2 diabetes mellitus. The initial diagnosis of diabetes was made 3 years (diagnosed at approximate age of 35) ago. His disease course has been stable. There are no hypoglycemic associated symptoms. Pertinent negatives for hypoglycemia include no confusion, headaches, seizures or tremors. Associated symptoms include fatigue and foot paresthesias. Pertinent negatives for diabetes include no chest pain, no foot ulcerations, no polydipsia, no polyuria, no visual change and no weight loss. There are no hypoglycemic complications. Symptoms are stable. Diabetic complications include heart disease and peripheral neuropathy. Risk factors for coronary artery disease include diabetes mellitus, dyslipidemia, hypertension, male sex, obesity and sedentary lifestyle. Current diabetic treatment includes oral agent (dual therapy). He is  compliant with treatment most of the time. His weight is fluctuating minimally. He is following a generally unhealthy diet. When asked about meal planning, he reported none. He has not had a previous visit with a dietitian. He rarely participates in exercise. His breakfast blood glucose range is generally 130-140 mg/dl. (He presents today with his meter, no logs, showing inconsistent glucose monitoring pattern.  He is dealing with the recent loss of his wife, and admits he needs to do better with his diabetes management.  His POCT A1c today is 7.6%, essentially unchanged from previous visit of 7.7%.  He denies any hypoglycemia.  Says when he checks his glucose is usually around the 140 range.) An ACE inhibitor/angiotensin II receptor blocker is not being taken. He does not see a podiatrist.Eye exam is current.   Review of systems  Constitutional: + Minimally fluctuating body weight,  current Body mass index is 29.85 kg/m. ,  no subjective hyperthermia, no subjective hypothermia Eyes: no blurry vision, no xerophthalmia ENT: no sore throat, no nodules palpated in throat, no dysphagia/odynophagia, no hoarseness Cardiovascular: no chest pain, no shortness of breath, no palpitations, no leg swelling Respiratory: no cough, no shortness of breath Gastrointestinal: no nausea/vomiting/diarrhea Musculoskeletal: no muscle/joint aches Skin: no rashes, no hyperemia Neurological: no tremors, + numbness/tingling to BLE (feet)-worsening Psychiatric: + depression-mourning the loss of his wife, no anxiety   Objective:    BP 111/61   Pulse (!) 59   Ht 5\' 11"  (1.803 m)   Wt 214 lb (97.1 kg)   BMI 29.85 kg/m   Wt Readings from Last 3 Encounters:  08/09/21 214 lb (97.1 kg)  06/07/21 213 lb (96.6 kg)  05/30/21 213 lb 12.8 oz (97 kg)    BP Readings from Last 3 Encounters:  08/09/21 111/61  06/07/21 (!) 153/91  05/30/21 110/67     Physical Exam- Limited  Constitutional:  Body mass index is 29.85 kg/m.  , not in acute distress, normal state of mind Eyes:  EOMI, no exophthalmos Neck: Supple Cardiovascular: RRR, no murmurs,  rubs, or gallops, no edema Respiratory: Adequate breathing efforts, no crackles, rales, rhonchi, or wheezing Musculoskeletal: no gross deformities, strength intact in all four extremities, no gross restriction of joint movements Skin:  no rashes, no hyperemia Neurological: no tremor with outstretched hands    CMP ( most recent) CMP     Component Value Date/Time   NA 139 06/07/2021 1345   K 4.0 06/07/2021 1345   CL 103 06/07/2021 1345   CO2 26 06/07/2021 1345   GLUCOSE 152 (H) 06/07/2021 1345   BUN 12 06/07/2021 1345   CREATININE 1.02 06/07/2021 1345   CREATININE 1.16 07/03/2019 1343   CALCIUM 9.3 06/07/2021 1345   PROT 6.9 06/07/2021 1345   ALBUMIN 4.2 06/07/2021 1345   AST 21 06/07/2021 1345   ALT 21 06/07/2021 1345   ALKPHOS 75 06/07/2021 1345   BILITOT 1.0 06/07/2021 1345   GFRNONAA >60 06/07/2021 1345   GFRAA >60 08/12/2020 1336     Diabetic Labs (most recent): Lab Results  Component Value Date   HGBA1C 7.6 (A) 08/09/2021   HGBA1C 7.7 (A) 02/23/2021   HGBA1C 7.1 (H) 08/12/2020     Lipid Panel ( most recent) Lipid Panel  No results found for: CHOL, TRIG, HDL, CHOLHDL, VLDL, LDLCALC, LDLDIRECT, LABVLDL    Lab Results  Component Value Date   TSH 3.235 05/02/2018         Assessment & Plan:   1) Type 2 diabetes mellitus with hyperglycemia, without long-term current use of insulin ([place])  He presents today with his meter, no logs, showing inconsistent glucose monitoring pattern.  He is dealing with the recent loss of his wife, and admits he needs to do better with his diabetes management.  His POCT A1c today is 7.6%, essentially unchanged from previous visit of 7.7%.  He denies any hypoglycemia.  Says when he checks his glucose is usually around the 140 range.  - [name] has currently uncontrolled symptomatic type 2 DM since 80 years  of age.   Recent labs reviewed.  - I had a long discussion with him about the progressive nature of diabetes and the pathology behind its complications.  -his diabetes is complicated by CAD, peripheral neuropathy and he remains at a high risk for more acute and chronic complications which include CAD, CVA, CKD, retinopathy, and neuropathy. These are all discussed in detail with him.  - Nutritional counseling repeated at each appointment due to patients tendency to fall back in to old habits.  - The patient admits there is a room for improvement in their diet and drink choices. -  Suggestion is made for the patient to avoid simple carbohydrates from their diet including Cakes, Sweet Desserts / Pastries, Ice Cream, Soda (diet and regular), Sweet Tea, Candies, Chips, Cookies, Sweet Pastries, Store Bought Juices, Alcohol in Excess of 1-2 drinks a day, Artificial Sweeteners, Coffee Creamer, and "Sugar-free" Products. This will help patient to have stable blood glucose profile and potentially avoid unintended weight gain.   - I encouraged the patient to switch to unprocessed or minimally processed complex starch and increased protein intake (animal or plant source), fruits, and vegetables.   - Patient is advised to stick to a routine mealtimes to eat 3 meals a day and avoid unnecessary snacks (to snack only to correct hypoglycemia).  - I have approached him with the following individualized plan to manage  his diabetes and patient agrees:   -Given his stable glycemic profile, he is advised to continue his Metformin  500 mg ER daily with breakfast and Glipizide 5 mg po daily with breakfast (was restarted on this sometime between visits by a different provider).    -He is encouraged to start consistently monitoring blood glucose at least once daily, before breakfast and to notify the clinic if his glucose readings are less than 70 or above 200 for 3 tests in a row.  - Specific targets for  A1c;  LDL,  HDL,  and Triglycerides were discussed with the patient.  2) Blood Pressure /Hypertension:  His blood pressure is controlled to target without the use of antihypertensive medications.  3) Lipids/Hyperlipidemia:  There are no recent lipid panel results to review, nor does he take any medication for high cholesterol.  He reports he has had his cholesterol checked at his PCP, although no records have been sent for our records.   4)  Weight/Diet:  His Body mass index is 69.67 kg/m.  - complicating his diabetes care.   he is a candidate for some weight loss. I discussed with him the fact that loss of 5 - 10% of his  current body weight will have the most impact on his diabetes management.  Exercise, and detailed carbohydrates information provided  -  detailed on discharge instructions.  5) Chronic Care/Health Maintenance: -he is encouraged to initiate and continue to follow up with Ophthalmology, Dentist,  Podiatrist at least yearly or according to recommendations, and advised to stay away from smoking and/or secondhand smoke exposure. I have recommended yearly flu vaccine and pneumonia vaccine at least every 5 years; moderate intensity exercise for up to 150 minutes weekly; and  sleep for at least 7 hours a day.  - he is advised to maintain close follow up with Redmond School, MD for primary care needs, as well as his other providers for optimal and coordinated care.      I spent 40 minutes in the care of the patient today including review of labs from [place], Lipids, Thyroid Function, Hematology (current and previous including abstractions from other facilities); face-to-face time discussing  his blood glucose readings/logs, discussing hypoglycemia and hyperglycemia episodes and symptoms, medications doses, his options of short and long term treatment based on the latest standards of care / guidelines;  discussion about incorporating lifestyle medicine;  and documenting the encounter.    Please  refer to Patient Instructions for Blood Glucose Monitoring and Insulin/Medications Dosing Guide"  in media tab for additional information. Please  also refer to " Patient Self Inventory" in the Media  tab for reviewed elements of pertinent patient history.  [name] participated in the discussions, expressed understanding, and voiced agreement with the above plans.  All questions were answered to his satisfaction. he is encouraged to contact clinic should he have any questions or concerns prior to his return visit.    Follow up plan: - Return in about 6 months (around 02/06/2022) for Diabetes F/U with A1c in office, No previsit labs, Bring meter and logs.  Rayetta Pigg, [facility] [address] [place], [place] [address] Phone: [phone] Fax: [phone]  08/09/2021, 3:12 PM

## 2021-08-10 ENCOUNTER — Ambulatory Visit (INDEPENDENT_AMBULATORY_CARE_PROVIDER_SITE_OTHER): Payer: Medicare Other | Admitting: Urology

## 2021-08-10 VITALS — BP 121/68 | HR 64 | Temp 98.5°F | Ht 71.0 in | Wt 215.0 lb

## 2021-08-10 DIAGNOSIS — I209 Angina pectoris, unspecified: Secondary | ICD-10-CM | POA: Diagnosis not present

## 2021-08-10 DIAGNOSIS — N5201 Erectile dysfunction due to arterial insufficiency: Secondary | ICD-10-CM | POA: Diagnosis not present

## 2021-08-10 DIAGNOSIS — N138 Other obstructive and reflux uropathy: Secondary | ICD-10-CM

## 2021-08-10 DIAGNOSIS — R3912 Poor urinary stream: Secondary | ICD-10-CM | POA: Diagnosis not present

## 2021-08-10 DIAGNOSIS — R972 Elevated prostate specific antigen [PSA]: Secondary | ICD-10-CM

## 2021-08-10 DIAGNOSIS — N401 Enlarged prostate with lower urinary tract symptoms: Secondary | ICD-10-CM

## 2021-08-10 DIAGNOSIS — Q631 Lobulated, fused and horseshoe kidney: Secondary | ICD-10-CM | POA: Diagnosis not present

## 2021-08-10 DIAGNOSIS — R39198 Other difficulties with micturition: Secondary | ICD-10-CM | POA: Diagnosis not present

## 2021-08-10 DIAGNOSIS — N200 Calculus of kidney: Secondary | ICD-10-CM

## 2021-08-10 MED ORDER — TAMSULOSIN HCL 0.4 MG PO CAPS: 0.4000 mg | ORAL_CAPSULE | Freq: Two times a day (BID) | ORAL | 3 refills | Status: DC

## 2021-08-10 MED ORDER — TAMSULOSIN HCL 0.4 MG PO CAPS: 0.8000 mg | ORAL_CAPSULE | Freq: Two times a day (BID) | ORAL | 3 refills | Status: DC

## 2021-08-10 MED ORDER — TADALAFIL 5 MG PO TABS: 5.0000 mg | ORAL_TABLET | Freq: Every day | ORAL | 5 refills | Status: DC | PRN

## 2021-08-10 NOTE — Progress Notes (Signed)
Urological Symptom Review  Patient is experiencing the following symptoms: Get up at night to urinate Stream starts and stops Trouble starting stream Weak stream Erection problems (male only)   Review of Systems  Gastrointestinal (upper)  : Negative for upper GI symptoms  Gastrointestinal (lower) : Negative for lower GI symptoms  Constitutional : Fatigue  Skin: Negative for skin symptoms  Eyes: Negative for eye symptoms  Ear/Nose/Throat : Negative for Ear/Nose/Throat symptoms  Hematologic/Lymphatic: Negative for Hematologic/Lymphatic symptoms  Cardiovascular : Negative for cardiovascular symptoms  Respiratory : Negative for respiratory symptoms  Endocrine: Negative for endocrine symptoms  Musculoskeletal: Negative for musculoskeletal symptoms  Neurological: Negative for neurological symptoms  Psychologic: Negative for psychiatric symptoms

## 2021-08-10 NOTE — Progress Notes (Signed)
H&P  Chief Complaint: LUTS  History of Present Illness:  08/10/21: [name] returns today in f/u.  He is current on finasteride and tamsulosin 0.8 mg daily for his BPH with BOO and he reports improvement in his symptoms with the addition of the finasteride.  His IPSS is down to 23 from 30.  He has a good stream in the day that is slow at night.  He has nocturia x 3.  He has intermittency at night.  He has no incontinence.  He has had no hematuria or flank pain.  He can get a minimal partial erection and is inquiring about options.    02/02/21: [name] returns today in f/u.  He was given doxycyline on 11/04/20 for possible diverticulitis.   He had the CT I ordered on 12/26/20 and it showed diverticular disease without diverticulitis.  He has a horseshoe kidney with no residual stone disease or obstruction.  He has fairly severe LUTS with an IPSS of 30.  His urgency, frequency and intermittency.  He has a reduced stream at night and nocturia x 3.  He remains on tamsulosin.  He feels generally poorly today but has no fever.  He has no hematuria or dysuria.   12/17/21[name] returns today with a 1 week history of LLQ pain and hesitancy with frequent small voids.  He is not sleeping well because of the pain.  He has a history of stones and prostatitis.  He UA has 0-2 RBC's.  He had the Covid booster a week ago and had fever with that.  He has had no nausea.  He has a horseshoe kidney that had no left stones on a CT in 9/21.  He did have diverticular disease.   09/02/20: [name] returns today in f/u from recent right ureteral stone extraction.  The stone was impacted and I had to incise the ureteral meatus to release the stone and then fragment it to get it out of the bladder.   He has no hematuria.   He has back pain but no flank pain .  He is voiding well.  His IPSS is 21.  07/01/20: [name] returns today in f/u.  He reports resolution of the penile lesion.   He continues to have nocturia but he is getting up a lot  with his wife who has to get up.   He remains on tamsulosin.  His IPSS is 24.  He has been having some progressive SOB and Dr. Melvyn Novas had some questions about whether the tamsulosin may be contributing to the breathing issues.   His PSA in 1/21 was 6.3 which was down from 9.7.  He has some LLQ discomfort at times.  He has a horseshoe kidney and a right renal stone on a CT in 2015.   3.23.2021: Most recent PSA was 6.3 on 1.5.2021. He presents today c/o of sore spot underneath his penis that he has had for the last 2 wks. This has been too painful to touch. He has tried managing pain with medications and hot baths without success. He has also tried to "pop" it without producing any drainage. This pain has started to lessen in severity but he has recently begun having some mild dysuria and increased nocturia. Otherwise, his urinary sx's are stable.      01/27/19 10/23/18 10/17/17 09/18/16 09/06/15 03/02/15 04/03/14 06/13/12  PSA  Total PSA 6.4 ng/dl 9.7 ng/dl 5.3 ng/dl 5.8  4.66  5.64  5.20  4.36   Free PSA  1.0  0.56  0.58  0.64  0.62   % Free PSA  8 %  17  12  10  12  14       Past Medical History:  Diagnosis Date   Aortic atherosclerosis ([place]) 07/29/2020   Incidental CT finding.    Barrett's esophagus    short-segment, diagnosed in 2007 by Dr. [name]   BPH (benign prostatic hyperplasia)    CAD (coronary artery disease)    a. cath on 07/21/2019 showing patent LM and LCx with 40-60% Proximal LAD and 30-40% Pro-RCA stenosis with normal LV function.   Diabetes mellitus without complication (HCC)    type 1    Dyspnea    with exertion    GERD (gastroesophageal reflux disease)    Hiatal hernia    small   History of kidney stones    HTN (hypertension)    PAF (paroxysmal atrial fibrillation) ([place])    S/P colonoscopy 2007   unremarkable   S/P endoscopy Oct 2011    Salmon-colored epithelium coming up to 37 cm from the    Vertigo     Past Surgical History:  Procedure Laterality Date    APPENDECTOMY     BIOPSY  01/28/2020   Procedure: BIOPSY;  Surgeon: [name], MD;  [facility];  Service: Endoscopy;;  esophagus   CHOLECYSTECTOMY     COLONOSCOPY  02/06/2006   Dr. [name]- single anal papilla, o/w normal rectum, normal colon   COLONOSCOPY N/A 09/13/2016   RMR: three 5-6 mm for adenomas removed.  Recommend one last surveillance colonoscopy in October 2020.   COLONOSCOPY WITH PROPOFOL N/A 01/28/2020   Procedure: COLONOSCOPY WITH PROPOFOL;  Surgeon: [name], MD;  [facility];  Service: Endoscopy;  Laterality: N/A;  10:45am   CYSTOSCOPY WITH RETROGRADE PYELOGRAM, URETEROSCOPY AND STENT PLACEMENT Right 08/18/2020   Procedure: CYSTOSCOPY WITH INCISION OF RIGHT URETEROCELE, URETEROSCOPY WITH HOLMIUM LASER, CYSTOLITHOLAPAXY OF 10 MM BLADDER STONE;  Surgeon: [name], MD;  [facility];  Service: Urology;  Laterality: Right;   ESOPHAGOGASTRODUODENOSCOPY  09/15/2010   Dr. [name]- Salmon-colored epithelium coming up to 37 cm from the    ESOPHAGOGASTRODUODENOSCOPY N/A 07/02/2013   Dr. [name]- Barretts on bx. hiatal hernia   ESOPHAGOGASTRODUODENOSCOPY N/A 09/13/2016   RMR: Barrett's esophagus without dysplasia. one last EGD in 3 years.    ESOPHAGOGASTRODUODENOSCOPY (EGD) WITH PROPOFOL N/A 01/28/2020   Procedure: ESOPHAGOGASTRODUODENOSCOPY (EGD) WITH PROPOFOL;  Surgeon: [name], MD;  [facility];  Service: Endoscopy;  Laterality: N/A;   INTRAVASCULAR PRESSURE WIRE/FFR STUDY N/A 07/21/2019   Procedure: INTRAVASCULAR PRESSURE WIRE/FFR STUDY;  Surgeon: [name], MD;  Location: [facility];  Service: Cardiovascular;  Laterality: N/A;   LEFT HEART CATH AND CORONARY ANGIOGRAPHY N/A 07/21/2019   Procedure: LEFT HEART CATH AND CORONARY ANGIOGRAPHY;  Surgeon: [name], MD;  Location: [facility];  Service: Cardiovascular;  Laterality: N/A;   POLYPECTOMY  01/28/2020   Procedure: POLYPECTOMY;  Surgeon: [name], MD;   [facility];  Service: Endoscopy;;  colon   ROTATOR CUFF REPAIR     YAG LASER APPLICATION Right 03/22/2705   Procedure: YAG LASER APPLICATION;  Surgeon: [name], MD;  [facility]: [facility];  Service: Ophthalmology;  Laterality: Right;    Home Medications:  Allergies as of 08/10/2021   No Known Allergies      Medication List        Accurate as  of August 10, 2021  7:45 PM. If you have any questions, ask your nurse or doctor.          STOP taking these medications    acetaminophen 500 MG tablet Commonly known as: TYLENOL       TAKE these medications    Accu-Chek FastClix Lancets Misc 1 application by Does not apply route 2 (two) times daily.   allopurinol 100 MG tablet Commonly known as: ZYLOPRIM Take 100 mg by mouth daily.   ALPRAZolam 1 MG tablet Commonly known as: XANAX Take 0.5 mg by mouth at bedtime as needed for anxiety or sleep.   DULoxetine 60 MG capsule Commonly known as: CYMBALTA Take 60 mg by mouth daily.   fexofenadine 180 MG tablet Commonly known as: ALLEGRA Take 180 mg by mouth daily.   finasteride 5 MG tablet Commonly known as: PROSCAR Take 1 tablet (5 mg total) by mouth daily.   gabapentin 300 MG capsule Commonly known as: NEURONTIN Take 300 mg by mouth 2 (two) times daily.   glipiZIDE 5 MG tablet Commonly known as: GLUCOTROL Take 5 mg by mouth daily.   glucose blood test strip 1 each by Other route daily before breakfast. Use as instructed   HYDROcodone-acetaminophen 5-325 MG tablet Commonly known as: NORCO/VICODIN Take 1 tablet by mouth 4 (four) times daily as needed.   metFORMIN 500 MG 24 hr tablet Commonly known as: Glucophage XR Take 1 tablet (500 mg total) by mouth daily with breakfast.   pantoprazole 40 MG tablet Commonly known as: PROTONIX TAKE ONE TABLET (40MG  TOTAL) BY MOUTH TWO TIMES DAILY BEFORE A MEAL   tadalafil 5 MG tablet Commonly known as: CIALIS Take 1 tablet (5 mg total) by mouth daily as  needed for erectile dysfunction.   tamsulosin 0.4 MG Caps capsule Commonly known as: FLOMAX Take 1 capsule (0.4 mg total) by mouth 2 (two) times daily. What changed: how much to take   traMADol 50 MG tablet Commonly known as: ULTRAM Take 1 tablet (50 mg total) by mouth every 6 (six) hours as needed for moderate pain (pain.). What changed: when to take this   Vitamin D3 50 MCG (2000 UT) Tabs Take 2,000 Units by mouth daily.   warfarin 5 MG tablet Commonly known as: COUMADIN Take as directed by the anticoagulation clinic. If you are unsure how to take this medication, talk to your nurse or doctor. Original instructions: Take 1 tablet daily except 1 1/2 tablets on Saturday or as directed        Allergies: No Known Allergies  Family History  Problem Relation Age of Onset   Prostate cancer Father        deceased   Stroke Mother        deceased   Colon cancer Neg Hx     Social History:  reports that he quit smoking about 27 years ago. His smoking use included cigarettes. He has a 45.00 pack-year smoking history. He has never used smokeless tobacco. He reports that he does not drink alcohol and does not use drugs.  ROS: A complete review of systems was performed.  All systems are negative except for pertinent findings as noted.  Physical Exam:  BP 121/68   Pulse 64   Temp 98.5 F (36.9 C)   Ht 5\' 11"  (1.803 m)   Wt 215 lb (97.5 kg)   BMI 29.99 kg/m      Laboratory Data:  UA has 0-2 RBC's.   No results found for  this or any previous visit (from the past 24 hour(s)).      Impression/Assessment:  BPH with BOO with improvement with the addition of the finasteride to the tamsulosin.  Continue current meds and return in 6 months.   Elevated PSA.  I will repeat the PSA today since it wasn't done.   Hx of horseshoe kidney with stones.   CT on 12/26/20 was ok.    ED.  I will give him a trial of tadalafil 5mg  and have him take it either daily or 2-4 tabs po daily  depending on his preference.  He was instructed to not take within 4 hours of the tamsulosin and I reviewed the side effects and precautions.    CC: Dr. Redmond School.  Patient ID: [name], male   DOB: 23-Aug-1941, 80 y.o.   MRN: [id number]

## 2021-08-11 LAB — PSA, TOTAL AND FREE
PSA, Free Pct: 10.6 %
PSA, Free: 0.34 ng/mL
Prostate Specific Ag, Serum: 3.2 ng/mL (ref 0.0–4.0)

## 2021-08-14 NOTE — Progress Notes (Signed)
Sent via mychart

## 2021-08-23 ENCOUNTER — Ambulatory Visit (INDEPENDENT_AMBULATORY_CARE_PROVIDER_SITE_OTHER): Payer: Medicare Other | Admitting: *Deleted

## 2021-08-23 DIAGNOSIS — Z5181 Encounter for therapeutic drug level monitoring: Secondary | ICD-10-CM

## 2021-08-23 DIAGNOSIS — I4891 Unspecified atrial fibrillation: Secondary | ICD-10-CM | POA: Diagnosis not present

## 2021-08-23 LAB — POCT INR: INR: 2.5 (ref 2.0–3.0)

## 2021-08-23 NOTE — Patient Instructions (Signed)
Continue warfarin 1 tablet daily except 1 1/2 tablets on Saturdays Recheck INR in 4 wk Call us with any new medications, changes or any bleeding concerns @ [phone].  

## 2021-09-11 ENCOUNTER — Encounter: Payer: Self-pay | Admitting: *Deleted

## 2021-09-11 ENCOUNTER — Encounter: Payer: Self-pay | Admitting: Cardiology

## 2021-09-11 ENCOUNTER — Ambulatory Visit (INDEPENDENT_AMBULATORY_CARE_PROVIDER_SITE_OTHER): Payer: Medicare Other | Admitting: Cardiology

## 2021-09-11 VITALS — BP 124/80 | HR 55 | Ht 71.0 in | Wt 217.0 lb

## 2021-09-11 DIAGNOSIS — I209 Angina pectoris, unspecified: Secondary | ICD-10-CM

## 2021-09-11 DIAGNOSIS — I2510 Atherosclerotic heart disease of native coronary artery without angina pectoris: Secondary | ICD-10-CM | POA: Diagnosis not present

## 2021-09-11 DIAGNOSIS — I480 Paroxysmal atrial fibrillation: Secondary | ICD-10-CM | POA: Diagnosis not present

## 2021-09-11 NOTE — Progress Notes (Signed)
Cardiology Office Note  Date: 09/11/2021   ID: [name], DOB [birth year], MRN [id number]  PCP:  Redmond School, MD  Cardiologist:  Rozann Lesches, MD Electrophysiologist:  None   Chief Complaint  Patient presents with   Cardiac follow-up     History of Present Illness: [name] is a 80 y.o. male former patient of Dr. Bronson Ing now presenting to establish follow-up with me.  I reviewed his records and updated the chart.  He was last seen in September 2021 by Mr. Leonides Sake NP.  He presents without any specific angina symptoms.  He describes chronic neuropathy, has been having pain in his left arm from his neck down to his hand as well, aching, constant, not worse when he exerts himself.  He has had bilateral shoulder surgery, not aware of any cervical disc problems.  His wife passed away after a protracted illness back in August.  He states that he has been taking his medications regularly.  We are requesting his interval lab work from PCP.  At this point he is not on statin therapy.  No nitroglycerin use.  I personally reviewed his ECG which shows sinus bradycardia with low voltage and R' in lead V1 and V2.  He is on Coumadin with follow-up in anticoagulation clinic.  Last INR 2.5.  He does not report any spontaneous bleeding problems.  Past Medical History:  Diagnosis Date   Aortic atherosclerosis ([place])    Barrett's esophagus    Short-segment, diagnosed in 2007 by Dr. [name]   BPH (benign prostatic hyperplasia)    CAD (coronary artery disease)    a. cath on 07/21/2019 showing patent LM and LCx with 40-60% Proximal LAD and 30-40% Pro-RCA stenosis with normal LV function.   Essential hypertension    GERD (gastroesophageal reflux disease)    Hiatal hernia    History of kidney stones    PAF (paroxysmal atrial fibrillation) ([place])    S/P colonoscopy 2007   S/P endoscopy 08/2010    Salmon-colored epithelium coming up to 37 cm from the    Type 2 diabetes mellitus ([place])     Vertigo     Past Surgical History:  Procedure Laterality Date   APPENDECTOMY     BIOPSY  01/28/2020   Procedure: BIOPSY;  Surgeon: [name], MD;  [facility];  Service: Endoscopy;;  esophagus   CHOLECYSTECTOMY     COLONOSCOPY  02/06/2006   Dr. [name]- single anal papilla, o/w normal rectum, normal colon   COLONOSCOPY N/A 09/13/2016   RMR: three 5-6 mm for adenomas removed.  Recommend one last surveillance colonoscopy in October 2020.   COLONOSCOPY WITH PROPOFOL N/A 01/28/2020   Procedure: COLONOSCOPY WITH PROPOFOL;  Surgeon: [name], MD;  [facility];  Service: Endoscopy;  Laterality: N/A;  10:45am   CYSTOSCOPY WITH RETROGRADE PYELOGRAM, URETEROSCOPY AND STENT PLACEMENT Right 08/18/2020   Procedure: CYSTOSCOPY WITH INCISION OF RIGHT URETEROCELE, URETEROSCOPY WITH HOLMIUM LASER, CYSTOLITHOLAPAXY OF 10 MM BLADDER STONE;  Surgeon: [name], MD;  [facility];  Service: Urology;  Laterality: Right;   ESOPHAGOGASTRODUODENOSCOPY  09/15/2010   Dr. [name]- Salmon-colored epithelium coming up to 37 cm from the    ESOPHAGOGASTRODUODENOSCOPY N/A 07/02/2013   Dr. [name]- Barretts on bx. hiatal hernia   ESOPHAGOGASTRODUODENOSCOPY N/A 09/13/2016   RMR: Barrett's esophagus without dysplasia. one last EGD in 3 years.    ESOPHAGOGASTRODUODENOSCOPY (EGD) WITH PROPOFOL N/A 01/28/2020   Procedure: ESOPHAGOGASTRODUODENOSCOPY (EGD) WITH PROPOFOL;  Surgeon:  Rourk, Cristopher Estimable, MD;  [facility];  Service: Endoscopy;  Laterality: N/A;   INTRAVASCULAR PRESSURE WIRE/FFR STUDY N/A 07/21/2019   Procedure: INTRAVASCULAR PRESSURE WIRE/FFR STUDY;  Surgeon: [name], MD;  Location: [facility];  Service: Cardiovascular;  Laterality: N/A;   LEFT HEART CATH AND CORONARY ANGIOGRAPHY N/A 07/21/2019   Procedure: LEFT HEART CATH AND CORONARY ANGIOGRAPHY;  Surgeon: [name], MD;  Location: [facility];  Service: Cardiovascular;  Laterality: N/A;    POLYPECTOMY  01/28/2020   Procedure: POLYPECTOMY;  Surgeon: [name], MD;  [facility];  Service: Endoscopy;;  colon   ROTATOR CUFF REPAIR     YAG LASER APPLICATION Right 04/25/1244   Procedure: YAG LASER APPLICATION;  Surgeon: [name], MD;  [facility];  Service: Ophthalmology;  Laterality: Right;    Current Outpatient Medications  Medication Sig Dispense Refill   Accu-Chek FastClix Lancets MISC 1 application by Does not apply route 2 (two) times daily. 102 each 3   allopurinol (ZYLOPRIM) 100 MG tablet Take 100 mg by mouth daily.      ALPRAZolam (XANAX) 1 MG tablet Take 0.5 mg by mouth at bedtime as needed for anxiety or sleep.     Cholecalciferol (VITAMIN D3) 50 MCG (2000 UT) TABS Take 2,000 Units by mouth daily.     DULoxetine (CYMBALTA) 60 MG capsule Take 60 mg by mouth daily.     fexofenadine (ALLEGRA) 180 MG tablet Take 180 mg by mouth daily.     finasteride (PROSCAR) 5 MG tablet Take 1 tablet (5 mg total) by mouth daily. 90 tablet 3   gabapentin (NEURONTIN) 300 MG capsule Take 300 mg by mouth 2 (two) times daily.      glipiZIDE (GLUCOTROL) 5 MG tablet Take 5 mg by mouth daily.     glucose blood test strip 1 each by Other route daily before breakfast. Use as instructed 100 each 2   HYDROcodone-acetaminophen (NORCO/VICODIN) 5-325 MG tablet Take 1 tablet by mouth 4 (four) times daily as needed.     metFORMIN (GLUCOPHAGE XR) 500 MG 24 hr tablet Take 1 tablet (500 mg total) by mouth daily with breakfast. 90 tablet 3   pantoprazole (PROTONIX) 40 MG tablet TAKE ONE TABLET (40MG  TOTAL) BY MOUTH TWO TIMES DAILY BEFORE A MEAL 180 tablet 1   tadalafil (CIALIS) 5 MG tablet Take 1 tablet (5 mg total) by mouth daily as needed for erectile dysfunction. 30 tablet 5   tamsulosin (FLOMAX) 0.4 MG CAPS capsule Take 1 capsule (0.4 mg total) by mouth 2 (two) times daily. 180 capsule 3   traMADol (ULTRAM) 50 MG tablet Take 1 tablet (50 mg total) by mouth every 6 (six) hours as  needed for moderate pain (pain.). 12 tablet 0   warfarin (COUMADIN) 5 MG tablet Take 1 tablet daily except 1 1/2 tablets on Saturday or as directed 100 tablet 1   No current facility-administered medications for this visit.   Allergies:  Patient has no known allergies.   ROS: No palpitations or syncope.  Physical Exam: VS:  BP 124/80   Pulse (!) 55   Ht 5\' 11"  (1.803 m)   Wt 217 lb (98.4 kg)   SpO2 97%   BMI 30.27 kg/m , BMI Body mass index is 30.27 kg/m.  Wt Readings from Last 3 Encounters:  09/11/21 217 lb (98.4 kg)  08/10/21 215 lb (97.5 kg)  08/09/21 214 lb (97.1 kg)    General:  Patient appears comfortable at rest. HEENT: Conjunctiva and lids normal, wearing a mask. Neck: Supple, no elevated JVP or carotid bruits, no thyromegaly. Lungs: Clear to auscultation, nonlabored breathing at rest. Cardiac: Regular rate and rhythm, no S3, 1/6 systolic murmur, no pericardial rub. Extremities: No pitting edema.  ECG:  An ECG dated 06/07/2021 was personally reviewed today and demonstrated:  Sinus bradycardia with prolonged PR interval and borderline low voltage.  Recent Labwork: 06/07/2021: ALT 21; AST 21; BUN 12; Creatinine, Ser 1.02; Hemoglobin 14.9; Platelets 190; Potassium 4.0; Sodium 139   Other Studies Reviewed Today:  Cardiac catheterization 07/21/2019: Widely patent left main 40 to 60% proximal LAD with mild calcification.  DFR on LAD stenosis 0.94. Widely patent circumflex Very tortuous proximal right coronary with 30 to 40% proximal narrowing.  No significant obstruction identified. Normal LV function.  Echocardiogram 04/19/2020:  1. Left ventricular ejection fraction, by estimation, is 65 to 70%. The  left ventricle has normal function. The left ventricle has no regional  wall motion abnormalities. Left ventricular diastolic parameters were  normal.   2. Right ventricular systolic function is normal. The right ventricular  size is normal. There is normal pulmonary  artery systolic pressure. The  estimated right ventricular systolic pressure is 09.4 mmHg.   3. The mitral valve is grossly normal. Trivial mitral valve  regurgitation.   4. The aortic valve is tricuspid. Aortic valve regurgitation is trivial.   5. Aortic dilatation noted. There is mild dilatation of the aortic root.   6. The inferior vena cava is normal in size with greater than 50%  respiratory variability, suggesting right atrial pressure of 3 mmHg.   ABIs 10/28/2020: Right 1.44 Left 1.40  Assessment and Plan:  1.  Nonobstructive CAD by cardiac catheterization in 2020.  Plan medical therapy and observation at this point.  ECG reviewed.  Requesting lab work from PCP, he is currently not on statin therapy.  Also not on aspirin given use of Coumadin.  No obvious angina reported.  2.  Paroxysmal atrial fibrillation, in sinus rhythm today.  He is on Coumadin for stroke prophylaxis with follow-up in anticoagulation clinic.  CHA2DS2-VASc score is 5.  3.  Type 2 diabetes mellitus, currently on Glucophage XR and Glucotrol.  Possible candidate for SGLT2 inhibitor as well.  Need to review lab work.  Medication Adjustments/Labs and Tests Ordered: Current medicines are reviewed at length with the patient today.  Concerns regarding medicines are outlined above.   Tests Ordered: Orders Placed This Encounter  Procedures   EKG 12-Lead     Medication Changes: No orders of the defined types were placed in this encounter.   Disposition:  Follow up  6 months.  Signed, Satira Sark, MD, [facility] 09/11/2021 11:51 AM    [facility] [address], [place], [place] [address] Phone: [phone]; Fax: [phone]

## 2021-09-11 NOTE — Patient Instructions (Addendum)

## 2021-09-12 ENCOUNTER — Ambulatory Visit: Payer: Medicare Other | Admitting: Cardiology

## 2021-09-15 DIAGNOSIS — Z23 Encounter for immunization: Secondary | ICD-10-CM | POA: Diagnosis not present

## 2021-09-20 ENCOUNTER — Telehealth: Payer: Self-pay | Admitting: *Deleted

## 2021-09-20 ENCOUNTER — Ambulatory Visit (INDEPENDENT_AMBULATORY_CARE_PROVIDER_SITE_OTHER): Payer: Medicare Other | Admitting: *Deleted

## 2021-09-20 DIAGNOSIS — E782 Mixed hyperlipidemia: Secondary | ICD-10-CM

## 2021-09-20 DIAGNOSIS — Z79899 Other long term (current) drug therapy: Secondary | ICD-10-CM

## 2021-09-20 DIAGNOSIS — I4891 Unspecified atrial fibrillation: Secondary | ICD-10-CM | POA: Diagnosis not present

## 2021-09-20 DIAGNOSIS — Z5181 Encounter for therapeutic drug level monitoring: Secondary | ICD-10-CM

## 2021-09-20 DIAGNOSIS — I2510 Atherosclerotic heart disease of native coronary artery without angina pectoris: Secondary | ICD-10-CM

## 2021-09-20 LAB — POCT INR: INR: 2.3 (ref 2.0–3.0)

## 2021-09-20 MED ORDER — ROSUVASTATIN CALCIUM 10 MG PO TABS: 10.0000 mg | ORAL_TABLET | Freq: Every day | ORAL | 1 refills | Status: DC

## 2021-09-20 NOTE — Telephone Encounter (Signed)
Patient informed and verbalized understanding of plan. Lab orders given Will have done at [facility]

## 2021-09-20 NOTE — Patient Instructions (Signed)
Continue warfarin 1 tablet daily except 1 1/2 tablets on Saturdays Recheck INR in 5 wk Call us with any new medications, changes or any bleeding concerns @ [phone].

## 2021-09-20 NOTE — Telephone Encounter (Signed)
-----   Message from Satira Sark, MD sent at 09/19/2021 11:58 AM EDT ----- Results reviewed.  LDL was 118 in May 2021.  With coronary disease and diabetes would suggest starting statin therapy.  Consider starting Crestor 10 mg daily.  Check FLP and LFTs in 3 months if initiated.

## 2021-09-22 DIAGNOSIS — F320 Major depressive disorder, single episode, mild: Secondary | ICD-10-CM | POA: Diagnosis not present

## 2021-09-22 DIAGNOSIS — Z6825 Body mass index (BMI) 25.0-25.9, adult: Secondary | ICD-10-CM | POA: Diagnosis not present

## 2021-09-22 DIAGNOSIS — E1140 Type 2 diabetes mellitus with diabetic neuropathy, unspecified: Secondary | ICD-10-CM | POA: Diagnosis not present

## 2021-09-22 DIAGNOSIS — E663 Overweight: Secondary | ICD-10-CM | POA: Diagnosis not present

## 2021-09-22 DIAGNOSIS — J329 Chronic sinusitis, unspecified: Secondary | ICD-10-CM | POA: Diagnosis not present

## 2021-09-28 ENCOUNTER — Other Ambulatory Visit: Payer: Self-pay | Admitting: Nurse Practitioner

## 2021-09-28 ENCOUNTER — Other Ambulatory Visit: Payer: Self-pay | Admitting: Family Medicine

## 2021-10-17 DIAGNOSIS — J9801 Acute bronchospasm: Secondary | ICD-10-CM | POA: Diagnosis not present

## 2021-10-17 DIAGNOSIS — J042 Acute laryngotracheitis: Secondary | ICD-10-CM | POA: Diagnosis not present

## 2021-10-17 DIAGNOSIS — Z6825 Body mass index (BMI) 25.0-25.9, adult: Secondary | ICD-10-CM | POA: Diagnosis not present

## 2021-10-17 DIAGNOSIS — I480 Paroxysmal atrial fibrillation: Secondary | ICD-10-CM | POA: Diagnosis not present

## 2021-10-17 DIAGNOSIS — E663 Overweight: Secondary | ICD-10-CM | POA: Diagnosis not present

## 2021-10-17 DIAGNOSIS — E1140 Type 2 diabetes mellitus with diabetic neuropathy, unspecified: Secondary | ICD-10-CM | POA: Diagnosis not present

## 2021-10-25 ENCOUNTER — Ambulatory Visit (INDEPENDENT_AMBULATORY_CARE_PROVIDER_SITE_OTHER): Payer: Medicare Other | Admitting: *Deleted

## 2021-10-25 DIAGNOSIS — Z5181 Encounter for therapeutic drug level monitoring: Secondary | ICD-10-CM

## 2021-10-25 DIAGNOSIS — I4891 Unspecified atrial fibrillation: Secondary | ICD-10-CM | POA: Diagnosis not present

## 2021-10-25 LAB — POCT INR: INR: 2.5 (ref 2.0–3.0)

## 2021-10-25 NOTE — Patient Instructions (Signed)
Continue warfarin 1 tablet daily except 1 1/2 tablets on Saturdays Recheck INR in 6 wk Call us with any new medications, changes or any bleeding concerns @ [phone].  

## 2021-11-10 ENCOUNTER — Ambulatory Visit: Payer: Medicare Other | Admitting: Cardiology

## 2021-11-17 DIAGNOSIS — I480 Paroxysmal atrial fibrillation: Secondary | ICD-10-CM | POA: Diagnosis not present

## 2021-11-17 DIAGNOSIS — E782 Mixed hyperlipidemia: Secondary | ICD-10-CM | POA: Diagnosis not present

## 2021-11-17 DIAGNOSIS — E119 Type 2 diabetes mellitus without complications: Secondary | ICD-10-CM | POA: Diagnosis not present

## 2021-12-06 ENCOUNTER — Ambulatory Visit (INDEPENDENT_AMBULATORY_CARE_PROVIDER_SITE_OTHER): Payer: Medicare Other | Admitting: *Deleted

## 2021-12-06 DIAGNOSIS — I4891 Unspecified atrial fibrillation: Secondary | ICD-10-CM

## 2021-12-06 DIAGNOSIS — Z5181 Encounter for therapeutic drug level monitoring: Secondary | ICD-10-CM | POA: Diagnosis not present

## 2021-12-06 LAB — POCT INR: INR: 3.1 — AB (ref 2.0–3.0)

## 2021-12-06 NOTE — Patient Instructions (Signed)
Take warfarin 1/2 tablet tonight then resume 1 tablet daily except 1 1/2 tablets on Saturdays Recheck INR in 6 wk Call us with any new medications, changes or any bleeding concerns @ [phone].

## 2021-12-20 DIAGNOSIS — E559 Vitamin D deficiency, unspecified: Secondary | ICD-10-CM | POA: Diagnosis not present

## 2021-12-20 DIAGNOSIS — J9801 Acute bronchospasm: Secondary | ICD-10-CM | POA: Diagnosis not present

## 2021-12-20 DIAGNOSIS — E663 Overweight: Secondary | ICD-10-CM | POA: Diagnosis not present

## 2021-12-20 DIAGNOSIS — N400 Enlarged prostate without lower urinary tract symptoms: Secondary | ICD-10-CM | POA: Diagnosis not present

## 2021-12-20 DIAGNOSIS — E782 Mixed hyperlipidemia: Secondary | ICD-10-CM | POA: Diagnosis not present

## 2021-12-20 DIAGNOSIS — F320 Major depressive disorder, single episode, mild: Secondary | ICD-10-CM | POA: Diagnosis not present

## 2021-12-20 DIAGNOSIS — Z0001 Encounter for general adult medical examination with abnormal findings: Secondary | ICD-10-CM | POA: Diagnosis not present

## 2021-12-20 DIAGNOSIS — Z6825 Body mass index (BMI) 25.0-25.9, adult: Secondary | ICD-10-CM | POA: Diagnosis not present

## 2021-12-20 DIAGNOSIS — L309 Dermatitis, unspecified: Secondary | ICD-10-CM | POA: Diagnosis not present

## 2021-12-20 DIAGNOSIS — E538 Deficiency of other specified B group vitamins: Secondary | ICD-10-CM | POA: Diagnosis not present

## 2021-12-20 DIAGNOSIS — F32A Depression, unspecified: Secondary | ICD-10-CM | POA: Diagnosis not present

## 2021-12-20 DIAGNOSIS — E1140 Type 2 diabetes mellitus with diabetic neuropathy, unspecified: Secondary | ICD-10-CM | POA: Diagnosis not present

## 2021-12-20 DIAGNOSIS — Z1331 Encounter for screening for depression: Secondary | ICD-10-CM | POA: Diagnosis not present

## 2021-12-20 DIAGNOSIS — J329 Chronic sinusitis, unspecified: Secondary | ICD-10-CM | POA: Diagnosis not present

## 2021-12-20 DIAGNOSIS — J042 Acute laryngotracheitis: Secondary | ICD-10-CM | POA: Diagnosis not present

## 2021-12-21 DIAGNOSIS — E1140 Type 2 diabetes mellitus with diabetic neuropathy, unspecified: Secondary | ICD-10-CM | POA: Diagnosis not present

## 2021-12-21 DIAGNOSIS — N400 Enlarged prostate without lower urinary tract symptoms: Secondary | ICD-10-CM | POA: Diagnosis not present

## 2021-12-21 DIAGNOSIS — Z0001 Encounter for general adult medical examination with abnormal findings: Secondary | ICD-10-CM | POA: Diagnosis not present

## 2022-01-17 ENCOUNTER — Ambulatory Visit (INDEPENDENT_AMBULATORY_CARE_PROVIDER_SITE_OTHER): Payer: Medicare Other | Admitting: *Deleted

## 2022-01-17 DIAGNOSIS — I4891 Unspecified atrial fibrillation: Secondary | ICD-10-CM | POA: Diagnosis not present

## 2022-01-17 DIAGNOSIS — Z5181 Encounter for therapeutic drug level monitoring: Secondary | ICD-10-CM

## 2022-01-17 LAB — POCT INR: INR: 2.5 (ref 2.0–3.0)

## 2022-01-17 NOTE — Patient Instructions (Signed)
Continue warfarin 1 tablet daily except 1 1/2 tablets on Saturdays Recheck INR in 6 wk Call us with any new medications, changes or any bleeding concerns @ [phone].  

## 2022-01-23 DIAGNOSIS — Z85828 Personal history of other malignant neoplasm of skin: Secondary | ICD-10-CM | POA: Diagnosis not present

## 2022-01-23 DIAGNOSIS — C44519 Basal cell carcinoma of skin of other part of trunk: Secondary | ICD-10-CM | POA: Diagnosis not present

## 2022-01-23 DIAGNOSIS — Z08 Encounter for follow-up examination after completed treatment for malignant neoplasm: Secondary | ICD-10-CM | POA: Diagnosis not present

## 2022-02-01 ENCOUNTER — Ambulatory Visit (INDEPENDENT_AMBULATORY_CARE_PROVIDER_SITE_OTHER): Payer: Medicare Other | Admitting: Urology

## 2022-02-01 ENCOUNTER — Other Ambulatory Visit: Payer: Self-pay

## 2022-02-01 ENCOUNTER — Encounter: Payer: Self-pay | Admitting: Urology

## 2022-02-01 VITALS — BP 108/65 | HR 58

## 2022-02-01 DIAGNOSIS — R972 Elevated prostate specific antigen [PSA]: Secondary | ICD-10-CM | POA: Diagnosis not present

## 2022-02-01 DIAGNOSIS — Q631 Lobulated, fused and horseshoe kidney: Secondary | ICD-10-CM | POA: Diagnosis not present

## 2022-02-01 DIAGNOSIS — R39198 Other difficulties with micturition: Secondary | ICD-10-CM | POA: Diagnosis not present

## 2022-02-01 DIAGNOSIS — R3912 Poor urinary stream: Secondary | ICD-10-CM | POA: Diagnosis not present

## 2022-02-01 DIAGNOSIS — N200 Calculus of kidney: Secondary | ICD-10-CM | POA: Diagnosis not present

## 2022-02-01 DIAGNOSIS — N5201 Erectile dysfunction due to arterial insufficiency: Secondary | ICD-10-CM | POA: Diagnosis not present

## 2022-02-01 DIAGNOSIS — N138 Other obstructive and reflux uropathy: Secondary | ICD-10-CM | POA: Diagnosis not present

## 2022-02-01 DIAGNOSIS — N401 Enlarged prostate with lower urinary tract symptoms: Secondary | ICD-10-CM | POA: Diagnosis not present

## 2022-02-01 DIAGNOSIS — N481 Balanitis: Secondary | ICD-10-CM | POA: Diagnosis not present

## 2022-02-01 MED ORDER — FINASTERIDE 5 MG PO TABS: 5.0000 mg | ORAL_TABLET | Freq: Every day | ORAL | 3 refills | Status: DC

## 2022-02-01 MED ORDER — TAMSULOSIN HCL 0.4 MG PO CAPS: 0.4000 mg | ORAL_CAPSULE | Freq: Two times a day (BID) | ORAL | 3 refills | Status: DC

## 2022-02-01 NOTE — Progress Notes (Signed)
H&P ? ?Chief Complaint: LUTS ? ?History of Present Illness:  ?02/01/22: [name] returns today in f/u.  His PSA was down to 3.2 on 08/10/21 from 6.3 on 12/04/19.  He reports malodorous urine but no hematuria or dysuria.  He remains on finasteride and tamsulosin 0.'8mg'$ .  The nocturia is down to 1-2x.  He has a decent stream during the day.  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  He has ED and has tadalafil and sildenafil but hasn't had much response.  The tadalafil was expensive.  ? ?08/10/21: [name] returns today in f/u.  He is current on finasteride and tamsulosin 0.8 mg daily for his BPH with BOO and he reports improvement in his symptoms with the addition of the finasteride.  His IPSS is down to 23 from 30.  He has a good stream in the day that is slow at night.  He has nocturia x 3.  He has intermittency at night.  He has no incontinence.  He has had no hematuria or flank pain.  He can get a minimal partial erection and is inquiring about options.  ? ? ?02/02/21: [name] returns today in f/u.  He was given doxycyline on 11/04/20 for possible diverticulitis.   He had the CT I ordered on 12/26/20 and it showed diverticular disease without diverticulitis.  He has a horseshoe kidney with no residual stone disease or obstruction.  He has fairly severe LUTS with an IPSS of 30.  His urgency, frequency and intermittency.  He has a reduced stream at night and nocturia x 3.  He remains on tamsulosin.  He feels generally poorly today but has no fever.  He has no hematuria or dysuria. ? ? ?11/04/20: [name] returns today with a 1 week history of LLQ pain and hesitancy with frequent small voids.  He is not sleeping well because of the pain.  He has a history of stones and prostatitis.  He UA has 0-2 RBC's.  He had the Covid booster a week ago and had fever with that.  He has had no nausea.  He has a horseshoe kidney that had no left stones on a CT in 9/21.  He did have diverticular disease.   ? ?09/02/20: [name] returns today in f/u from recent right ureteral stone extraction.  The stone was impacted and I had to incise the ureteral meatus to release the stone and then fragment it to get it out of the bladder.   He has no hematuria.   He has back pain but no flank pain .  He is voiding well.  His IPSS is 21. ? ?07/01/20: [name] returns today in f/u.  He reports resolution of the penile lesion.   He continues to have nocturia but he is getting up a lot with his wife who has to get up.   He remains on tamsulosin.  His IPSS is 24.  He has been having some progressive SOB and Dr. Melvyn Novas had some questions about whether the tamsulosin may be contributing to the breathing issues.   His PSA in 1/21 was 6.3 which was down from 9.7.  He has some LLQ discomfort at times.  He has a horseshoe kidney and a right renal stone on a CT in 2015.  ? ?3.23.2021: Most recent PSA was 6.3 on 1.5.2021. He presents today c/o of sore spot underneath his penis that he has had for the last 2 wks. This has been too painful  to touch. He has tried managing pain with medications and hot baths without success. He has also tried to "pop" it without producing any drainage. This pain has started to lessen in severity but he has recently begun having some mild dysuria and increased nocturia. Otherwise, his urinary sx's are stable.  ? ? ? ? 01/27/19 10/23/18 10/17/17 09/18/16 09/06/15 03/02/15 04/03/14 06/13/12  ?PSA  ?Total PSA 6.4 ng/dl 9.7 ng/dl 5.3 ng/dl 5.8  4.66  5.64  5.20  4.36   ?Free PSA    1.0  0.56  0.58  0.64  0.62   ?% Free PSA  8 %  '17  12  10  12  14   '$ ? ?ROS: Fatigue,, Cough and Easy bruising.  ? ?Past Medical History:  ?Diagnosis Date  ? Aortic atherosclerosis ([place])   ? Barrett's esophagus   ? Short-segment, diagnosed in 2007 by Dr. [name]  ? BPH (benign prostatic hyperplasia)   ? CAD (coronary artery disease)   ? a. cath on 07/21/2019 showing patent LM and LCx with 40-60% Proximal LAD and 30-40% Pro-RCA stenosis with normal LV  function.  ? Essential hypertension   ? GERD (gastroesophageal reflux disease)   ? Hiatal hernia   ? History of kidney stones   ? PAF (paroxysmal atrial fibrillation) ([place])   ? S/P colonoscopy 2007  ? S/P endoscopy 08/2010  ?  Salmon-colored epithelium coming up to 37 cm from the   ? Type 2 diabetes mellitus ([place])   ? Vertigo   ? ? ?Past Surgical History:  ?Procedure Laterality Date  ? APPENDECTOMY    ? BIOPSY  01/28/2020  ? Procedure: BIOPSY;  Surgeon: [name], MD;  [facility];  Service: Endoscopy;;  esophagus  ? CHOLECYSTECTOMY    ? COLONOSCOPY  02/06/2006  ? Dr. [name]- single anal papilla, o/w normal rectum, normal colon  ? COLONOSCOPY N/A 09/13/2016  ? RMR: three 5-6 mm for adenomas removed.  Recommend one last surveillance colonoscopy in October 2020.  ? COLONOSCOPY WITH PROPOFOL N/A 01/28/2020  ? Procedure: COLONOSCOPY WITH PROPOFOL;  Surgeon: [name], MD;  [facility];  Service: Endoscopy;  Laterality: N/A;  10:45am  ? CYSTOSCOPY WITH RETROGRADE PYELOGRAM, URETEROSCOPY AND STENT PLACEMENT Right 08/18/2020  ? Procedure: CYSTOSCOPY WITH INCISION OF RIGHT URETEROCELE, URETEROSCOPY WITH HOLMIUM LASER, CYSTOLITHOLAPAXY OF 10 MM BLADDER STONE;  Surgeon: [name], MD;  [facility];  Service: Urology;  Laterality: Right;  ? ESOPHAGOGASTRODUODENOSCOPY  09/15/2010  ? Dr. [name]- Salmon-colored epithelium coming up to 37 cm from the   ? ESOPHAGOGASTRODUODENOSCOPY N/A 07/02/2013  ? Dr. [name]- Barretts on bx. hiatal hernia  ? ESOPHAGOGASTRODUODENOSCOPY N/A 09/13/2016  ? RMR: Barrett's esophagus without dysplasia. one last EGD in 3 years.   ? ESOPHAGOGASTRODUODENOSCOPY (EGD) WITH PROPOFOL N/A 01/28/2020  ? Procedure: ESOPHAGOGASTRODUODENOSCOPY (EGD) WITH PROPOFOL;  Surgeon: [name], MD;  [facility];  Service: Endoscopy;  Laterality: N/A;  ? INTRAVASCULAR PRESSURE WIRE/FFR STUDY N/A 07/21/2019  ? Procedure: INTRAVASCULAR PRESSURE WIRE/FFR STUDY;  Surgeon: [name], MD;  Location: [facility];  Service: Cardiovascular;  Laterality: N/A;  ? LEFT HEART CATH AND CORONARY ANGIOGRAPHY N/A 07/21/2019  ? Procedure: LEFT HEART CATH AND CORONARY ANGIOGRAPHY;  Surgeon: [name], MD;  Location: [facility];  Service: Cardiovascular;  Laterality: N/A;  ? POLYPECTOMY  01/28/2020  ? Procedure: POLYPECTOMY;  Surgeon: [name], MD;  [facility];  Service:  Endoscopy;;  colon  ? ROTATOR CUFF REPAIR    ? YAG LASER APPLICATION Right 12/20/3084  ? Procedure: YAG LASER APPLICATION;  Surgeon: [name], MD;  [facility];  Service: Ophthalmology;  Laterality: Right;  ? ? ?Home Medications:  ?Allergies as of 02/01/2022   ?No Known Allergies ?  ? ?  ?Medication List  ?  ? ?  ? Accurate as of February 01, 2022  7:24 PM. If you have any questions, ask your nurse or doctor.  ?  ?  ? ?  ? ?Accu-Chek FastClix Lancets Misc ?1 application by Does not apply route 2 (two) times daily. ?  ?allopurinol 100 MG tablet ?Commonly known as: ZYLOPRIM ?Take 100 mg by mouth daily. ?  ?ALPRAZolam 1 MG tablet ?Commonly known as: Duanne Moron ?Take 0.5 mg by mouth at bedtime as needed for anxiety or sleep. ?  ?DULoxetine 60 MG capsule ?Commonly known as: CYMBALTA ?Take 60 mg by mouth daily. ?  ?fexofenadine 180 MG tablet ?Commonly known as: ALLEGRA ?Take 180 mg by mouth daily. ?  ?finasteride 5 MG tablet ?Commonly known as: PROSCAR ?Take 1 tablet (5 mg total) by mouth daily. ?  ?gabapentin 300 MG capsule ?Commonly known as: NEURONTIN ?Take 300 mg by mouth 2 (two) times daily. ?  ?glipiZIDE 5 MG tablet ?Commonly known as: GLUCOTROL ?Take 5 mg by mouth daily. ?  ?glucose blood test strip ?1 each by Other route daily before breakfast. Use as instructed ?  ?HYDROcodone-acetaminophen 5-325 MG tablet ?Commonly known as: NORCO/VICODIN ?Take 1 tablet by mouth 4 (four) times daily as needed. ?  ?metFORMIN 500 MG 24 hr tablet ?Commonly known as: GLUCOPHAGE-XR ?TAKE 1 TABLET BY MOUTH DAILY  WITH BREAKFAST ?  ?pantoprazole 40 MG tablet ?Commonly known as: PROTONIX ?TAKE ONE TABLET ('40MG'$  TOTAL) BY MOUTH TWO TIMES DAILY BEFORE A MEAL ?  ?rosuvastatin 10 MG tablet ?Commonly known as: CRESTOR ?Take 1 tab

## 2022-02-06 ENCOUNTER — Ambulatory Visit: Payer: Medicare Other | Admitting: Nurse Practitioner

## 2022-02-06 DIAGNOSIS — E1140 Type 2 diabetes mellitus with diabetic neuropathy, unspecified: Secondary | ICD-10-CM | POA: Diagnosis not present

## 2022-02-06 DIAGNOSIS — J329 Chronic sinusitis, unspecified: Secondary | ICD-10-CM | POA: Diagnosis not present

## 2022-02-06 DIAGNOSIS — E663 Overweight: Secondary | ICD-10-CM | POA: Diagnosis not present

## 2022-02-06 DIAGNOSIS — Z6828 Body mass index (BMI) 28.0-28.9, adult: Secondary | ICD-10-CM | POA: Diagnosis not present

## 2022-02-06 DIAGNOSIS — I2510 Atherosclerotic heart disease of native coronary artery without angina pectoris: Secondary | ICD-10-CM | POA: Diagnosis not present

## 2022-02-06 NOTE — Patient Instructions (Incomplete)

## 2022-02-20 DIAGNOSIS — Z85828 Personal history of other malignant neoplasm of skin: Secondary | ICD-10-CM | POA: Diagnosis not present

## 2022-02-20 DIAGNOSIS — Z08 Encounter for follow-up examination after completed treatment for malignant neoplasm: Secondary | ICD-10-CM | POA: Diagnosis not present

## 2022-02-20 DIAGNOSIS — X32XXXD Exposure to sunlight, subsequent encounter: Secondary | ICD-10-CM | POA: Diagnosis not present

## 2022-02-20 DIAGNOSIS — L570 Actinic keratosis: Secondary | ICD-10-CM | POA: Diagnosis not present

## 2022-02-21 ENCOUNTER — Ambulatory Visit (INDEPENDENT_AMBULATORY_CARE_PROVIDER_SITE_OTHER): Payer: Medicare Other | Admitting: Nurse Practitioner

## 2022-02-21 ENCOUNTER — Encounter: Payer: Self-pay | Admitting: Nurse Practitioner

## 2022-02-21 VITALS — BP 106/63 | HR 63 | Ht 71.0 in | Wt 214.4 lb

## 2022-02-21 DIAGNOSIS — E1165 Type 2 diabetes mellitus with hyperglycemia: Secondary | ICD-10-CM

## 2022-02-21 DIAGNOSIS — E559 Vitamin D deficiency, unspecified: Secondary | ICD-10-CM | POA: Diagnosis not present

## 2022-02-21 DIAGNOSIS — R5383 Other fatigue: Secondary | ICD-10-CM | POA: Diagnosis not present

## 2022-02-21 LAB — POCT GLYCOSYLATED HEMOGLOBIN (HGB A1C): HbA1c, POC (controlled diabetic range): 8.3 % — AB (ref 0.0–7.0)

## 2022-02-21 LAB — POCT UA - MICROALBUMIN
Albumin/Creatinine Ratio, Urine, POC: <30
Creatinine, POC: 200 mg/dL
Microalbumin Ur, POC: 10 mg/L

## 2022-02-21 NOTE — Patient Instructions (Signed)

## 2022-02-21 NOTE — Progress Notes (Signed)
? ?                                                             Endocrinology Follow Up Visit ?     02/21/2022, 2:21 PM ? ? ? ? ? ?Subjective:  ? ? Patient ID: [name], male    DOB: 1941/02/11.  ?[name] is being seen in follow up after being seen in consultation for management of currently uncontrolled symptomatic diabetes requested by  Redmond School, MD.  He is a retired Engineer, structural for [employer]. ? ? ?Past Medical History:  ?Diagnosis Date  ? Aortic atherosclerosis ([place])   ? Barrett's esophagus   ? Short-segment, diagnosed in 2007 by Dr. [name]  ? BPH (benign prostatic hyperplasia)   ? CAD (coronary artery disease)   ? a. cath on 07/21/2019 showing patent LM and LCx with 40-60% Proximal LAD and 30-40% Pro-RCA stenosis with normal LV function.  ? Essential hypertension   ? GERD (gastroesophageal reflux disease)   ? Hiatal hernia   ? History of kidney stones   ? PAF (paroxysmal atrial fibrillation) ([place])   ? S/P colonoscopy 2007  ? S/P endoscopy 08/2010  ?  Salmon-colored epithelium coming up to 37 cm from the   ? Type 2 diabetes mellitus ([place])   ? Vertigo   ? ? ?Past Surgical History:  ?Procedure Laterality Date  ? APPENDECTOMY    ? BIOPSY  01/28/2020  ? Procedure: BIOPSY;  Surgeon: [name], MD;  [facility];  Service: Endoscopy;;  esophagus  ? CHOLECYSTECTOMY    ? COLONOSCOPY  02/06/2006  ? Dr. [name]- single anal papilla, o/w normal rectum, normal colon  ? COLONOSCOPY N/A 09/13/2016  ? RMR: three 5-6 mm for adenomas removed.  Recommend one last surveillance colonoscopy in October 2020.  ? COLONOSCOPY WITH PROPOFOL N/A 01/28/2020  ? Procedure: COLONOSCOPY WITH PROPOFOL;  Surgeon: [name], MD;  [facility];  Service: Endoscopy;  Laterality: N/A;  10:45am  ? CYSTOSCOPY WITH RETROGRADE PYELOGRAM, URETEROSCOPY AND STENT PLACEMENT Right 08/18/2020  ? Procedure: CYSTOSCOPY WITH INCISION OF RIGHT URETEROCELE, URETEROSCOPY WITH HOLMIUM  LASER, CYSTOLITHOLAPAXY OF 10 MM BLADDER STONE;  Surgeon: [name], MD;  [facility];  Service: Urology;  Laterality: Right;  ? ESOPHAGOGASTRODUODENOSCOPY  09/15/2010  ? Dr. [name]- Salmon-colored epithelium coming up to 37 cm from the   ? ESOPHAGOGASTRODUODENOSCOPY N/A 07/02/2013  ? Dr. [name]- Barretts on bx. hiatal hernia  ? ESOPHAGOGASTRODUODENOSCOPY N/A 09/13/2016  ? RMR: Barrett's esophagus without dysplasia. one last EGD in 3 years.   ? ESOPHAGOGASTRODUODENOSCOPY (EGD) WITH PROPOFOL N/A 01/28/2020  ? Procedure: ESOPHAGOGASTRODUODENOSCOPY (EGD) WITH PROPOFOL;  Surgeon: [name], MD;  [facility];  Service: Endoscopy;  Laterality: N/A;  ? INTRAVASCULAR PRESSURE WIRE/FFR STUDY N/A 07/21/2019  ? Procedure: INTRAVASCULAR PRESSURE WIRE/FFR STUDY;  Surgeon: [name], MD;  Location: [place] [facility];  Service: Cardiovascular;  Laterality: N/A;  ? LEFT HEART CATH AND CORONARY ANGIOGRAPHY N/A 07/21/2019  ? Procedure: LEFT HEART CATH AND CORONARY ANGIOGRAPHY;  Surgeon: [name], MD;  Location: [place] [facility] [facility];  Service: Cardiovascular;  Laterality: N/A;  ? POLYPECTOMY  01/28/2020  ? Procedure: POLYPECTOMY;  Surgeon: [name], MD;  [facility];  Service: Endoscopy;;  colon  ? ROTATOR CUFF REPAIR    ? YAG LASER APPLICATION Right 11/24/9676  ? Procedure: YAG LASER APPLICATION;  Surgeon: [name], MD;  [facility];  Service: Ophthalmology;  Laterality: Right;  ? ? ?Social History  ? ?Socioeconomic History  ? Marital status: Married  ?  Spouse name: Not on file  ? Number of children: Not on file  ? Years of education: Not on file  ? Highest education level: Not on file  ?Occupational History  ? Not on file  ?Tobacco Use  ? Smoking status: Former  ?  Packs/day: 1.50  ?  Years: 30.00  ?  Pack years: 45.00  ?  Types: Cigarettes  ?  Quit date: 11/18/1993  ?  Years since quitting: 28.2  ? Smokeless tobacco: Never  ? Tobacco comments:  ?  quit in the 90's  ?Vaping  Use  ? Vaping Use: Never used  ?Substance and Sexual Activity  ? Alcohol use: No  ? Drug use: No  ? Sexual activity: Not on file  ?Other Topics Concern  ? Not on file  ?Social History Narrative  ? Not on file  ? ?Social Determinants of Health  ? ?Financial Resource Strain: Not on file  ?Food Insecurity: Not on file  ?Transportation Needs: Not on file  ?Physical Activity: Not on file  ?Stress: Not on file  ?Social Connections: Not on file  ? ? ?Family History  ?Problem Relation Age of Onset  ? Prostate cancer Father   ?     deceased  ? Stroke Mother   ?     deceased  ? Colon cancer Neg Hx   ? ? ?Outpatient Encounter Medications as of 02/21/2022  ?Medication Sig  ? Accu-Chek FastClix Lancets MISC 1 application by Does not apply route 2 (two) times daily.  ? allopurinol (ZYLOPRIM) 100 MG tablet Take 100 mg by mouth daily.   ? ALPRAZolam (XANAX) 1 MG tablet Take 0.5 mg by mouth at bedtime as needed for anxiety or sleep.  ? Cholecalciferol (VITAMIN D3) 50 MCG (2000 UT) TABS Take 2,000 Units by mouth daily.  ? DULoxetine (CYMBALTA) 60 MG capsule Take 60 mg by mouth daily.  ? escitalopram (LEXAPRO) 10 MG tablet Take 10 mg by mouth daily.  ? fexofenadine (ALLEGRA) 180 MG tablet Take 180 mg by mouth daily.  ? finasteride (PROSCAR) 5 MG tablet Take 1 tablet (5 mg total) by mouth daily.  ? gabapentin (NEURONTIN) 300 MG capsule Take 300 mg by mouth 2 (two) times daily.   ? glipiZIDE (GLUCOTROL) 5 MG tablet Take 5 mg by mouth daily.  ? glucose blood test strip 1 each by Other route daily before breakfast. Use as instructed  ? HYDROcodone-acetaminophen (NORCO/VICODIN) 5-325 MG tablet Take 1 tablet by mouth 4 (four) times daily as needed.  ? pantoprazole (PROTONIX) 40 MG tablet TAKE ONE TABLET ('40MG'$  TOTAL) BY MOUTH TWO TIMES DAILY BEFORE A MEAL  ? rosuvastatin (CRESTOR) 10 MG tablet Take 1 tablet (10 mg total) by mouth daily.  ? sildenafil (VIAGRA) 50 MG tablet Take 50 mg by mouth daily.  ? tadalafil (CIALIS) 5 MG tablet Take 1  tablet (5 mg total) by mouth daily as needed for erectile dysfunction.  ? tamsulosin (FLOMAX) 0.4 MG CAPS capsule Take 1 capsule (0.4 mg total) by mouth 2 (two) times daily.  ? traMADol (ULTRAM) 50 MG tablet Take 1 tablet (50 mg total) by mouth every 6 (six)  hours as needed for moderate pain (pain.).  ? warfarin (COUMADIN) 5 MG tablet TAKE ONE TABLET BY MOUTH DAILY EXCEPT TAKE ONE AND ONE-HALF TABLETS ON SATURDAY OR AS DIRECTED  ? [DISCONTINUED] metFORMIN (GLUCOPHAGE) 1000 MG tablet Take 1,000 mg by mouth daily.  ? metFORMIN (GLUCOPHAGE-XR) 500 MG 24 hr tablet TAKE 1 TABLET BY MOUTH DAILY WITH BREAKFAST (Patient not taking: Reported on 02/21/2022)  ? ?No facility-administered encounter medications on file as of 02/21/2022.  ? ? ?ALLERGIES: ?No Known Allergies ? ?VACCINATION STATUS: ?Immunization History  ?Administered Date(s) Administered  ? Influenza-Unspecified 09/19/2013  ? ? ?Diabetes ?He presents for his follow-up diabetic visit. He has type 2 diabetes mellitus. Onset time: diagnosed at approximate age of 38. His disease course has been worsening. There are no hypoglycemic associated symptoms. Pertinent negatives for hypoglycemia include no confusion, headaches, seizures or tremors. Associated symptoms include fatigue and foot paresthesias. Pertinent negatives for diabetes include no chest pain, no foot ulcerations, no polydipsia, no polyuria, no visual change and no weight loss. There are no hypoglycemic complications. Symptoms are stable. Diabetic complications include heart disease and peripheral neuropathy. Risk factors for coronary artery disease include diabetes mellitus, dyslipidemia, hypertension, male sex, obesity and sedentary lifestyle. Current diabetic treatment includes oral agent (dual therapy). He is compliant with treatment most of the time. His weight is fluctuating minimally. He is following a generally unhealthy diet. When asked about meal planning, he reported none. He has not had a previous  visit with a dietitian. He rarely participates in exercise. His breakfast blood glucose range is generally 180-200 mg/dl. (He presents today with his meter, no logs, showing above target glycemic profile overa

## 2022-02-22 ENCOUNTER — Telehealth: Payer: Self-pay | Admitting: Nurse Practitioner

## 2022-02-22 NOTE — Telephone Encounter (Signed)
Patient was seen yesterday and believed you had mentioned a medication he needed to cut back on. ?

## 2022-02-22 NOTE — Telephone Encounter (Signed)
Yes, his Gabapentin.  He is saying he is very sleepy and that medication may be contributing to it.  I wanted him to skip his morning dose and only take it before bed to see if it helps his fatigue improve.

## 2022-02-22 NOTE — Telephone Encounter (Signed)
Informed patient

## 2022-02-26 ENCOUNTER — Other Ambulatory Visit: Payer: Self-pay

## 2022-02-26 MED ORDER — METFORMIN HCL ER 500 MG PO TB24: 1000.0000 mg | ORAL_TABLET | Freq: Every day | ORAL | 0 refills | Status: DC

## 2022-03-01 ENCOUNTER — Ambulatory Visit (INDEPENDENT_AMBULATORY_CARE_PROVIDER_SITE_OTHER): Payer: Medicare Other | Admitting: *Deleted

## 2022-03-01 DIAGNOSIS — I4891 Unspecified atrial fibrillation: Secondary | ICD-10-CM

## 2022-03-01 DIAGNOSIS — Z5181 Encounter for therapeutic drug level monitoring: Secondary | ICD-10-CM

## 2022-03-01 LAB — POCT INR: INR: 3.2 — AB (ref 2.0–3.0)

## 2022-03-01 NOTE — Patient Instructions (Signed)
Hold warfarin tonight then resume 1 tablet daily except 1 1/2 tablets on Saturdays ?Recheck INR in 6 wk ?Call us with any new medications, changes or any bleeding concerns @ [phone].  ?

## 2022-03-28 ENCOUNTER — Telehealth: Payer: Self-pay | Admitting: Nurse Practitioner

## 2022-03-28 NOTE — Telephone Encounter (Signed)
Pt called and states his legs and feet have been hurting him really bad and he doe shave neuropathy and wants to know if his sugar levels could be making the pain worse.  ? ?Patient is not checking readings consistently. ? ?5/5 before supper 109 -- before bed 288 ? ?5/6 after breakfast 339 -- before bed 296 ? ?5/7 did not check all day ? ?5/8 before breakfast 163 ? ?5/9 after breakfast 219 ? ?  ?

## 2022-03-28 NOTE — Telephone Encounter (Signed)
He has mentioned this problem to me before.  His glucose readings are fluctuating (sometimes higher than it should be) but overall not too bad that would cause his neuropathy to worsen.  He is already on Cymbalta and substantial dose of Gabapentin but I worry about the side effects of those medications at higher doses.  I recommend he reach out to his PCP, who ordered those medications to see if they can recommend any alternatives.

## 2022-03-28 NOTE — Telephone Encounter (Signed)
Informed patient

## 2022-04-01 ENCOUNTER — Emergency Department (HOSPITAL_COMMUNITY): Payer: Medicare Other

## 2022-04-01 ENCOUNTER — Encounter (HOSPITAL_COMMUNITY): Payer: Self-pay | Admitting: *Deleted

## 2022-04-01 ENCOUNTER — Observation Stay (HOSPITAL_COMMUNITY)
Admission: EM | Admit: 2022-04-01 | Discharge: 2022-04-02 | Disposition: A | Discharge status: Home / Self Care | Payer: Medicare Other | Attending: Neurosurgery | Admitting: Neurosurgery

## 2022-04-01 ENCOUNTER — Other Ambulatory Visit: Payer: Self-pay

## 2022-04-01 DIAGNOSIS — M7989 Other specified soft tissue disorders: Secondary | ICD-10-CM | POA: Diagnosis not present

## 2022-04-01 DIAGNOSIS — Z79899 Other long term (current) drug therapy: Secondary | ICD-10-CM | POA: Diagnosis not present

## 2022-04-01 DIAGNOSIS — Z7901 Long term (current) use of anticoagulants: Secondary | ICD-10-CM | POA: Insufficient documentation

## 2022-04-01 DIAGNOSIS — Z87891 Personal history of nicotine dependence: Secondary | ICD-10-CM | POA: Diagnosis not present

## 2022-04-01 DIAGNOSIS — S065X0A Traumatic subdural hemorrhage without loss of consciousness, initial encounter: Secondary | ICD-10-CM | POA: Diagnosis not present

## 2022-04-01 DIAGNOSIS — S066X0S Traumatic subarachnoid hemorrhage without loss of consciousness, sequela: Secondary | ICD-10-CM | POA: Insufficient documentation

## 2022-04-01 DIAGNOSIS — Z7984 Long term (current) use of oral hypoglycemic drugs: Secondary | ICD-10-CM | POA: Diagnosis not present

## 2022-04-01 DIAGNOSIS — S065XAA Traumatic subdural hemorrhage with loss of consciousness status unknown, initial encounter: Secondary | ICD-10-CM | POA: Diagnosis present

## 2022-04-01 DIAGNOSIS — S0990XA Unspecified injury of head, initial encounter: Secondary | ICD-10-CM | POA: Diagnosis present

## 2022-04-01 DIAGNOSIS — I6529 Occlusion and stenosis of unspecified carotid artery: Secondary | ICD-10-CM | POA: Diagnosis not present

## 2022-04-01 DIAGNOSIS — S0993XA Unspecified injury of face, initial encounter: Secondary | ICD-10-CM | POA: Diagnosis not present

## 2022-04-01 DIAGNOSIS — I480 Paroxysmal atrial fibrillation: Secondary | ICD-10-CM | POA: Insufficient documentation

## 2022-04-01 DIAGNOSIS — S62115A Nondisplaced fracture of triquetrum [cuneiform] bone, left wrist, initial encounter for closed fracture: Secondary | ICD-10-CM | POA: Diagnosis not present

## 2022-04-01 DIAGNOSIS — I2510 Atherosclerotic heart disease of native coronary artery without angina pectoris: Secondary | ICD-10-CM | POA: Diagnosis not present

## 2022-04-01 DIAGNOSIS — I10 Essential (primary) hypertension: Secondary | ICD-10-CM | POA: Diagnosis not present

## 2022-04-01 DIAGNOSIS — W01198A Fall on same level from slipping, tripping and stumbling with subsequent striking against other object, initial encounter: Secondary | ICD-10-CM | POA: Diagnosis not present

## 2022-04-01 DIAGNOSIS — E119 Type 2 diabetes mellitus without complications: Secondary | ICD-10-CM | POA: Insufficient documentation

## 2022-04-01 DIAGNOSIS — S066X0A Traumatic subarachnoid hemorrhage without loss of consciousness, initial encounter: Secondary | ICD-10-CM | POA: Diagnosis not present

## 2022-04-01 DIAGNOSIS — W19XXXA Unspecified fall, initial encounter: Principal | ICD-10-CM

## 2022-04-01 LAB — GLUCOSE, CAPILLARY: Glucose-Capillary: 214 mg/dL — ABNORMAL HIGH (ref 70–99)

## 2022-04-01 LAB — BASIC METABOLIC PANEL
Anion gap: 6 (ref 5–15)
BUN: 18 mg/dL (ref 8–23)
CO2: 25 mmol/L (ref 22–32)
Calcium: 8.7 mg/dL — ABNORMAL LOW (ref 8.9–10.3)
Chloride: 106 mmol/L (ref 98–111)
Creatinine, Ser: 0.99 mg/dL (ref 0.61–1.24)
GFR, Estimated: >60 mL/min (ref >60)
Glucose, Bld: 202 mg/dL — ABNORMAL HIGH (ref 70–99)
Potassium: 3.9 mmol/L (ref 3.5–5.1)
Sodium: 137 mmol/L (ref 135–145)

## 2022-04-01 LAB — CBC
HCT: 37.4 % — ABNORMAL LOW (ref 39.0–52.0)
Hemoglobin: 12.9 g/dL — ABNORMAL LOW (ref 13.0–17.0)
MCH: 31.6 pg (ref 26.0–34.0)
MCHC: 34.5 g/dL (ref 30.0–36.0)
MCV: 91.7 fL (ref 80.0–100.0)
Platelets: 160 10*3/uL (ref 150–400)
RBC: 4.08 MIL/uL — ABNORMAL LOW (ref 4.22–5.81)
RDW: 13.6 % (ref 11.5–15.5)
WBC: 8.1 10*3/uL (ref 4.0–10.5)
nRBC: 0 % (ref 0.0–0.2)

## 2022-04-01 LAB — PROTIME-INR
INR: 2.2 — ABNORMAL HIGH (ref 0.8–1.2)
Prothrombin Time: 23.8 seconds — ABNORMAL HIGH (ref 11.4–15.2)

## 2022-04-01 MED ORDER — VITAMIN K1 10 MG/ML IJ SOLN
INTRAMUSCULAR | Status: AC
  Filled 2022-04-01: qty 1

## 2022-04-01 MED ORDER — POLYETHYLENE GLYCOL 3350 17 G PO PACK: 17.0000 g | PACK | Freq: Every day | ORAL | Status: DC | PRN

## 2022-04-01 MED ORDER — PROTHROMBIN COMPLEX CONC HUMAN 500 UNITS IV KIT
1562.0000 [IU] | PACK | Status: AC
  Administered 2022-04-01: 1562 [IU] via INTRAVENOUS
  Filled 2022-04-01: qty 1562

## 2022-04-01 MED ORDER — VITAMIN K1 10 MG/ML IJ SOLN
10.0000 mg | INTRAVENOUS | Status: AC
  Administered 2022-04-01: 10 mg via INTRAVENOUS
  Filled 2022-04-01: qty 1

## 2022-04-01 MED ORDER — METOPROLOL TARTRATE 5 MG/5ML IV SOLN: 5.0000 mg | Freq: Four times a day (QID) | INTRAVENOUS | Status: DC | PRN

## 2022-04-01 MED ORDER — BISACODYL 10 MG RE SUPP: 10.0000 mg | Freq: Every day | RECTAL | Status: DC | PRN

## 2022-04-01 MED ORDER — FENTANYL CITRATE PF 50 MCG/ML IJ SOSY
50.0000 ug | PREFILLED_SYRINGE | Freq: Once | INTRAMUSCULAR | Status: AC
  Administered 2022-04-01: 50 ug via INTRAVENOUS
  Filled 2022-04-01: qty 1

## 2022-04-01 MED ORDER — HYDROCODONE-ACETAMINOPHEN 5-325 MG PO TABS
1.0000 | ORAL_TABLET | ORAL | Status: DC | PRN
  Administered 2022-04-02 (×2): 2 via ORAL
  Administered 2022-04-02: 1 via ORAL
  Administered 2022-04-02: 2 via ORAL
  Filled 2022-04-01: qty 2
  Filled 2022-04-01 (×2): qty 1
  Filled 2022-04-01: qty 2
  Filled 2022-04-01: qty 1

## 2022-04-01 MED ORDER — ACETAMINOPHEN 650 MG RE SUPP: 650.0000 mg | Freq: Four times a day (QID) | RECTAL | Status: DC | PRN

## 2022-04-01 MED ORDER — SODIUM CHLORIDE 0.9 % IV SOLN: INTRAVENOUS | Status: DC

## 2022-04-01 MED ORDER — SODIUM CHLORIDE 0.9% FLUSH: 3.0000 mL | Freq: Two times a day (BID) | INTRAVENOUS | Status: DC

## 2022-04-01 MED ORDER — TRAMADOL HCL 50 MG PO TABS
100.0000 mg | ORAL_TABLET | Freq: Once | ORAL | Status: AC
  Administered 2022-04-01: 100 mg via ORAL
  Filled 2022-04-01: qty 2

## 2022-04-01 MED ORDER — LEVETIRACETAM 500 MG PO TABS
500.0000 mg | ORAL_TABLET | Freq: Two times a day (BID) | ORAL | Status: DC
  Administered 2022-04-01 – 2022-04-02 (×2): 500 mg via ORAL
  Filled 2022-04-01 (×2): qty 1

## 2022-04-01 MED ORDER — INSULIN ASPART 100 UNIT/ML IJ SOLN
0.0000 [IU] | Freq: Three times a day (TID) | INTRAMUSCULAR | Status: DC
  Administered 2022-04-02: 5 [IU] via SUBCUTANEOUS
  Administered 2022-04-02: 3 [IU] via SUBCUTANEOUS

## 2022-04-01 MED ORDER — CHLORHEXIDINE GLUCONATE CLOTH 2 % EX PADS: 6.0000 | MEDICATED_PAD | Freq: Every day | CUTANEOUS | Status: DC

## 2022-04-01 MED ORDER — FLEET ENEMA 7-19 GM/118ML RE ENEM: 1.0000 | ENEMA | Freq: Once | RECTAL | Status: DC | PRN

## 2022-04-01 MED ORDER — ACETAMINOPHEN 325 MG PO TABS
650.0000 mg | ORAL_TABLET | Freq: Four times a day (QID) | ORAL | Status: DC | PRN
  Administered 2022-04-02: 650 mg via ORAL
  Filled 2022-04-01: qty 2

## 2022-04-01 NOTE — ED Notes (Signed)
Dr Alvino Chapel in triage evaluating pt at this time. ?

## 2022-04-01 NOTE — ED Provider Notes (Addendum)
[facility] EMERGENCY DEPARTMENT Provider Note   CSN: [id number] Arrival date & time: 04/01/22  1932     History  Chief Complaint  Patient presents with   Lytle Michaels    [name] is a 81 y.o. male.   Fall Pertinent negatives include no abdominal pain and no shortness of breath.  Patient presents after mechanical fall.  Lost his balance while playing with his grandson outside.  Face first on the ground.  He is on Coumadin for A-fib and hit his forehead.  No loss conscious.  Does have pain particularly in left wrist although less to the right wrist.  No neck pain.  No chest or abdominal pain.  No numbness or weakness.     Home Medications Prior to Admission medications   Medication Sig Start Date End Date Taking? Authorizing Provider  Accu-Chek FastClix Lancets MISC 1 application by Does not apply route 2 (two) times daily. 08/23/20   Brita Romp, NP  allopurinol (ZYLOPRIM) 100 MG tablet Take 100 mg by mouth daily.  02/12/19   [provider]  ALPRAZolam Duanne Moron) 1 MG tablet Take 0.5 mg by mouth at bedtime as needed for anxiety or sleep.    [provider]  Cholecalciferol (VITAMIN D3) 50 MCG (2000 UT) TABS Take 2,000 Units by mouth daily.    [provider]  DULoxetine (CYMBALTA) 60 MG capsule Take 60 mg by mouth daily. 10/20/20   [provider]  escitalopram (LEXAPRO) 10 MG tablet Take 10 mg by mouth daily. 01/02/22   [provider]  fexofenadine (ALLEGRA) 180 MG tablet Take 180 mg by mouth daily.    [provider]  finasteride (PROSCAR) 5 MG tablet Take 1 tablet (5 mg total) by mouth daily. 02/01/22   [name], MD  gabapentin (NEURONTIN) 300 MG capsule Take 300 mg by mouth 2 (two) times daily.     [provider]  glipiZIDE (GLUCOTROL) 5 MG tablet Take 5 mg by mouth daily. 07/26/21   [provider]  glucose blood test strip 1 each by Other route daily before breakfast. Use as instructed 09/26/20    Brita Romp, NP  HYDROcodone-acetaminophen (NORCO/VICODIN) 5-325 MG tablet Take 1 tablet by mouth 4 (four) times daily as needed. 05/24/21   [provider]  metFORMIN (GLUCOPHAGE-XR) 500 MG 24 hr tablet Take 2 tablets (1,000 mg total) by mouth daily with breakfast. 02/26/22   Brita Romp, NP  pantoprazole (PROTONIX) 40 MG tablet TAKE ONE TABLET ('40MG'$  TOTAL) BY MOUTH TWO TIMES DAILY BEFORE A MEAL 08/01/21   Mahala Menghini, PA-C  rosuvastatin (CRESTOR) 10 MG tablet Take 1 tablet (10 mg total) by mouth daily. 09/20/21   Satira Sark, MD  sildenafil (VIAGRA) 50 MG tablet Take 50 mg by mouth daily. 12/21/21   [provider]  tadalafil (CIALIS) 5 MG tablet Take 1 tablet (5 mg total) by mouth daily as needed for erectile dysfunction. 08/10/21   [name], MD  tamsulosin (FLOMAX) 0.4 MG CAPS capsule Take 1 capsule (0.4 mg total) by mouth 2 (two) times daily. 02/01/22   [name], MD  traMADol (ULTRAM) 50 MG tablet Take 1 tablet (50 mg total) by mouth every 6 (six) hours as needed for moderate pain (pain.). 08/18/20   [name], MD  warfarin (COUMADIN) 5 MG tablet TAKE ONE TABLET BY MOUTH DAILY EXCEPT TAKE ONE AND ONE-HALF TABLETS ON SATURDAY OR AS DIRECTED 09/28/21   Satira Sark, MD  Allergies    Patient has no known allergies.    Review of Systems   Review of Systems  Constitutional:  Negative for appetite change.  Respiratory:  Negative for shortness of breath.   Gastrointestinal:  Negative for abdominal pain.  Endocrine: Negative for polyuria.  Musculoskeletal:  Negative for back pain and neck pain.  Skin:  Positive for wound.  Neurological:  Negative for weakness.  Hematological:  Bruises/bleeds easily.  Psychiatric/Behavioral:  Negative for confusion.    Physical Exam Updated Vital Signs BP 132/90   Pulse (!) 59   Temp 97.8 F (36.6 C) (Oral)   Resp 19   Ht '5\' 11"'$  (1.803 m)   Wt 97.1 kg   SpO2 95%   BMI 29.85 kg/m  Physical  Exam Vitals reviewed.  HENT:     Head:     Comments: Abrasion to left forehead.  No frank laceration. Eyes:     Pupils: Pupils are equal, round, and reactive to light.  Cardiovascular:     Rate and Rhythm: Normal rate.  Pulmonary:     Breath sounds: No wheezing or rhonchi.  Abdominal:     Tenderness: There is no abdominal tenderness.  Musculoskeletal:     Comments: Tenderness to left wrist.  Also some tenderness to right wrist.  No tenderness to elbows or shoulders.  Skin:    Capillary Refill: Capillary refill takes less than 2 seconds.  Neurological:     Mental Status: He is alert and oriented to person, place, and time.     ED Results / Procedures / Treatments   Labs (all labs ordered are listed, but only abnormal results are displayed) Labs Reviewed  GLUCOSE, CAPILLARY - Abnormal; Notable for the following components:      Result Value   Glucose-Capillary 214 (*)    All other components within normal limits  CBC - Abnormal; Notable for the following components:   RBC 4.08 (*)    Hemoglobin 12.9 (*)    HCT 37.4 (*)    All other components within normal limits  PROTIME-INR  BASIC METABOLIC PANEL  HEMOGLOBIN A1C    EKG None  Radiology DG Wrist Complete Left  Result Date: 04/01/2022 CLINICAL DATA:  Fall, wrist injury EXAM: LEFT WRIST - COMPLETE 3+ VIEW COMPARISON:  None Available. FINDINGS: Acute triquetral fracture visualized on the lateral view. Mild degenerative changes at the radial side of the wrist. Soft tissue swelling of the wrist. IMPRESSION: Acute triquetral fracture. Electronically Signed   By: Ofilia Neas M.D.   On: 04/01/2022 20:29   CT Head Wo Contrast  Result Date: 04/01/2022 CLINICAL DATA:  Head trauma EXAM: CT HEAD WITHOUT CONTRAST TECHNIQUE: Contiguous axial images were obtained from the base of the skull through the vertex without intravenous contrast. RADIATION DOSE REDUCTION: This exam was performed according to the departmental  dose-optimization program which includes automated exposure control, adjustment of the mA and/or kV according to patient size and/or use of iterative reconstruction technique. COMPARISON:  CT head 06/07/2021 FINDINGS: Brain: Small acute left frontal parafalcine mixed subdural and subarachnoid hemorrhage identified. The subdural component measures up to 2.6 cm in AP length and 0.6 cm in thickness, with trace adjacent subarachnoid blood. No significant mass effect or edema. No hydrocephalus. Mild cortical volume loss. Mild patchy hypodensities in the periventricular and subcortical white matter, likely secondary to chronic microvascular ischemic changes. Vascular: Calcified plaques in the carotid siphons. Skull: Normal. Negative for fracture or focal lesion. Sinuses/Orbits: No acute finding. Other: None. IMPRESSION:  Small acute mixed subdural and subarachnoid hemorrhage in the left frontal parafalcine region. No significant mass effect. Findings discussed with Dr. Alvino Chapel over the telephone at 8:48 p.m. on 04/01/2022 with read back. Electronically Signed   By: Ofilia Neas M.D.   On: 04/01/2022 20:50   CT Maxillofacial Wo Contrast  Result Date: 04/01/2022 CLINICAL DATA:  Fall, facial trauma EXAM: CT MAXILLOFACIAL WITHOUT CONTRAST TECHNIQUE: Multidetector CT imaging of the maxillofacial structures was performed. Multiplanar CT image reconstructions were also generated. RADIATION DOSE REDUCTION: This exam was performed according to the departmental dose-optimization program which includes automated exposure control, adjustment of the mA and/or kV according to patient size and/or use of iterative reconstruction technique. COMPARISON:  None Available. FINDINGS: Osseous: No fracture or mandibular dislocation. No destructive process. Note is made of advanced degenerative changes in the visualized cervical spine. Orbits: Negative. No traumatic or inflammatory finding. Sinuses: No air-fluid levels. Soft tissues:  Mild soft tissue swelling in the right maxillary region. Limited intracranial: No acute findings. IMPRESSION: 1. No acute fracture identified. 2. Mild right maxillary soft tissue swelling. Electronically Signed   By: Ofilia Neas M.D.   On: 04/01/2022 20:52    Procedures Procedures    Medications Ordered in ED Medications  prothrombin complex conc human (KCENTRA) IVPB 1,562 Units (has no administration in time range)  phytonadione (VITAMIN K) 10 mg in dextrose 5 % 50 mL IVPB (10 mg Intravenous New Bag/Given 04/01/22 2216)  insulin aspart (novoLOG) injection 0-15 Units (has no administration in time range)  sodium chloride flush (NS) 0.9 % injection 3 mL (3 mLs Intravenous Not Given 04/01/22 2200)  0.9 %  sodium chloride infusion ( Intravenous New Bag/Given 04/01/22 2159)  acetaminophen (TYLENOL) tablet 650 mg (has no administration in time range)    Or  acetaminophen (TYLENOL) suppository 650 mg (has no administration in time range)  HYDROcodone-acetaminophen (NORCO/VICODIN) 5-325 MG per tablet 1-2 tablet (has no administration in time range)  polyethylene glycol (MIRALAX / GLYCOLAX) packet 17 g (has no administration in time range)  bisacodyl (DULCOLAX) suppository 10 mg (has no administration in time range)  sodium phosphate (FLEET) 7-19 GM/118ML enema 1 enema (has no administration in time range)  metoprolol tartrate (LOPRESSOR) injection 5 mg (has no administration in time range)  levETIRAcetam (KEPPRA) tablet 500 mg (has no administration in time range)  traMADol (ULTRAM) tablet 100 mg (100 mg Oral Given 04/01/22 2022)  fentaNYL (SUBLIMAZE) injection 50 mcg (50 mcg Intravenous Given 04/01/22 2118)    ED Course/ Medical Decision Making/ A&P                           Medical Decision Making Amount and/or Complexity of Data Reviewed Labs: ordered. Radiology: ordered.  Risk Prescription drug management. Decision regarding hospitalization.  Critical Care Total time providing  critical care: 30 minutes  Patient with fall.  Slipped and fell.  Hit head but also pain in left wrist.  Also some tenderness on right wrist but patient refused x-ray.  It does have moderate tenderness on left wrist.  Triquetral fracture on x-ray.  Immobilized with volar splint.  However does have subdural and subarachnoid blood.  Is on Coumadin for atrial fibrillation.INR still pending.  Discussed with neurosurgery, Glenford Peers.  CT scan reviewed.  Will emergently reverse with Feiba.  Will require ICU admission.  No plan for surgery at this time but will be monitored.  Will admit to ICU at [facility].  Follow-up as an outpatient  for the wrist fracture.  A month ago INR was 3.2.        Final Clinical Impression(s) / ED Diagnoses Final diagnoses:  Fall, initial encounter  Subdural hematoma ([place])  Closed nondisplaced fracture of triquetrum of left wrist, initial encounter    Rx / DC Orders ED Discharge Orders     None         Davonna Belling, MD 04/01/22 2219    Davonna Belling, MD 05/22/22 [id number]

## 2022-04-01 NOTE — ED Triage Notes (Addendum)
Pt fell today while playing with grandson.  Pt fell face forward on asphalt , pt is on Coumadin.  Abrasion noted to face , denies LOC.  C/o left wrist pain.   Radial pulses noted. Pt A&O.  ?

## 2022-04-01 NOTE — H&P (Signed)
Chief Complaint: Fall with [facility] and SDH ?  ?HPI: [name] is an 81 y.o. male with PmHx of a fib (on Coumadin), DM2, HTN, CAD, and BPH who presented to the [facility] ED today after falling face first on the asphalt while playing with his grandson. He reports losing his balance and striking his forehead on the ground. Workup in the ED revealed an acute left frontal SDH and SAH, and an acute left triquetral fracture. He denies any LOC or other neurological deficits. He endorses left wrist pain.  ? ?Past Medical History:  ?Diagnosis Date  ? Aortic atherosclerosis ([place])   ? Barrett's esophagus   ? Short-segment, diagnosed in 2007 by Dr. [name]  ? BPH (benign prostatic hyperplasia)   ? CAD (coronary artery disease)   ? a. cath on 07/21/2019 showing patent LM and LCx with 40-60% Proximal LAD and 30-40% Pro-RCA stenosis with normal LV function.  ? Essential hypertension   ? GERD (gastroesophageal reflux disease)   ? Hiatal hernia   ? History of kidney stones   ? PAF (paroxysmal atrial fibrillation) ([place])   ? S/P colonoscopy 2007  ? S/P endoscopy 08/2010  ?  Salmon-colored epithelium coming up to 37 cm from the   ? Type 2 diabetes mellitus ([place])   ? Vertigo   ? ? ?Past Surgical History:  ?Procedure Laterality Date  ? APPENDECTOMY    ? BIOPSY  01/28/2020  ? Procedure: BIOPSY;  Surgeon: [name], MD;  [facility];  Service: Endoscopy;;  esophagus  ? CHOLECYSTECTOMY    ? COLONOSCOPY  02/06/2006  ? Dr. [name]- single anal papilla, o/w normal rectum, normal colon  ? COLONOSCOPY N/A 09/13/2016  ? RMR: three 5-6 mm for adenomas removed.  Recommend one last surveillance colonoscopy in October 2020.  ? COLONOSCOPY WITH PROPOFOL N/A 01/28/2020  ? Procedure: COLONOSCOPY WITH PROPOFOL;  Surgeon: [name], MD;  Location: [facility];  Service: Endoscopy;  Laterality: N/A;  10:45am  ? CYSTOSCOPY WITH RETROGRADE PYELOGRAM, URETEROSCOPY AND STENT PLACEMENT Right 08/18/2020  ? Procedure: CYSTOSCOPY WITH INCISION OF  RIGHT URETEROCELE, URETEROSCOPY WITH HOLMIUM LASER, CYSTOLITHOLAPAXY OF 10 MM BLADDER STONE;  Surgeon: [name], MD;  Location: [facility];  Service: Urology;  Laterality: Right;  ? ESOPHAGOGASTRODUODENOSCOPY  09/15/2010  ? Dr. [name]- Salmon-colored epithelium coming up to 37 cm from the   ? ESOPHAGOGASTRODUODENOSCOPY N/A 07/02/2013  ? Dr. [name]- Barretts on bx. hiatal hernia  ? ESOPHAGOGASTRODUODENOSCOPY N/A 09/13/2016  ? RMR: Barrett's esophagus without dysplasia. one last EGD in 3 years.   ? ESOPHAGOGASTRODUODENOSCOPY (EGD) WITH PROPOFOL N/A 01/28/2020  ? Procedure: ESOPHAGOGASTRODUODENOSCOPY (EGD) WITH PROPOFOL;  Surgeon: [name], MD;  [facility];  Service: Endoscopy;  Laterality: N/A;  ? INTRAVASCULAR PRESSURE WIRE/FFR STUDY N/A 07/21/2019  ? Procedure: INTRAVASCULAR PRESSURE WIRE/FFR STUDY;  Surgeon: [name], MD;  Location: [place] [facility];  Service: Cardiovascular;  Laterality: N/A;  ? LEFT HEART CATH AND CORONARY ANGIOGRAPHY N/A 07/21/2019  ? Procedure: LEFT HEART CATH AND CORONARY ANGIOGRAPHY;  Surgeon: [name], MD;  Location: [place] [facility];  Service: Cardiovascular;  Laterality: N/A;  ? POLYPECTOMY  01/28/2020  ? Procedure: POLYPECTOMY;  Surgeon: [name], MD;  [facility];  Service: Endoscopy;;  colon  ? ROTATOR CUFF REPAIR    ? YAG LASER APPLICATION Right 12/23/5571  ? Procedure: YAG LASER APPLICATION;  Surgeon: [name], MD;  Location: [facility];  Service:  Ophthalmology;  Laterality: Right;  ? ? ?Family History  ?Problem Relation Age of Onset  ? Prostate cancer Father   ?     deceased  ? Stroke Mother   ?     deceased  ? Colon cancer Neg Hx   ? ?Social History:  reports that he quit smoking about 28 years ago. His smoking use included cigarettes. He has a 45.00 pack-year smoking history. He has never used smokeless tobacco. He reports that he does not drink alcohol and does not use drugs. ? ?Allergies: No Known Allergies ? ?(Not in a hospital  admission) ? ? ?Results for orders placed or performed during the hospital encounter of 04/01/22 (from the past 48 hour(s))  ?Glucose, capillary     Status: Abnormal  ? Collection Time: 04/01/22  8:02 PM  ?Result Value Ref Range  ? Glucose-Capillary 214 (H) 70 - 99 mg/dL  ?  Comment: Glucose reference range applies only to samples taken after fasting for at least 8 hours.  ?CBC     Status: Abnormal  ? Collection Time: 04/01/22  9:50 PM  ?Result Value Ref Range  ? WBC 8.1 4.0 - 10.5 K/uL  ? RBC 4.08 (L) 4.22 - 5.81 MIL/uL  ? Hemoglobin 12.9 (L) 13.0 - 17.0 g/dL  ? HCT 37.4 (L) 39.0 - 52.0 %  ? MCV 91.7 80.0 - 100.0 fL  ? MCH 31.6 26.0 - 34.0 pg  ? MCHC 34.5 30.0 - 36.0 g/dL  ? RDW 13.6 11.5 - 15.5 %  ? Platelets 160 150 - 400 K/uL  ? nRBC 0.0 0.0 - 0.2 %  ?  Comment: Performed at [facility], [address], [place], [place] [address]  ? ?DG Wrist Complete Left ? ?Result Date: 04/01/2022 ?CLINICAL DATA:  Fall, wrist injury EXAM: LEFT WRIST - COMPLETE 3+ VIEW COMPARISON:  None Available. FINDINGS: Acute triquetral fracture visualized on the lateral view. Mild degenerative changes at the radial side of the wrist. Soft tissue swelling of the wrist. IMPRESSION: Acute triquetral fracture. Electronically Signed   By: Ofilia Neas M.D.   On: 04/01/2022 20:29  ? ?CT Head Wo Contrast ? ?Result Date: 04/01/2022 ?CLINICAL DATA:  Head trauma EXAM: CT HEAD WITHOUT CONTRAST TECHNIQUE: Contiguous axial images were obtained from the base of the skull through the vertex without intravenous contrast. RADIATION DOSE REDUCTION: This exam was performed according to the departmental dose-optimization program which includes automated exposure control, adjustment of the mA and/or kV according to patient size and/or use of iterative reconstruction technique. COMPARISON:  CT head 06/07/2021 FINDINGS: Brain: Small acute left frontal parafalcine mixed subdural and subarachnoid hemorrhage identified. The subdural component measures up to 2.6  cm in AP length and 0.6 cm in thickness, with trace adjacent subarachnoid blood. No significant mass effect or edema. No hydrocephalus. Mild cortical volume loss. Mild patchy hypodensities in the periventricular and subcortical white matter, likely secondary to chronic microvascular ischemic changes. Vascular: Calcified plaques in the carotid siphons. Skull: Normal. Negative for fracture or focal lesion. Sinuses/Orbits: No acute finding. Other: None. IMPRESSION: Small acute mixed subdural and subarachnoid hemorrhage in the left frontal parafalcine region. No significant mass effect. Findings discussed with Dr. Alvino Chapel over the telephone at 8:48 p.m. on 04/01/2022 with read back. Electronically Signed   By: Ofilia Neas M.D.   On: 04/01/2022 20:50  ? ?CT Maxillofacial Wo Contrast ? ?Result Date: 04/01/2022 ?CLINICAL DATA:  Fall, facial trauma EXAM: CT MAXILLOFACIAL WITHOUT CONTRAST TECHNIQUE: Multidetector CT imaging of the maxillofacial  structures was performed. Multiplanar CT image reconstructions were also generated. RADIATION DOSE REDUCTION: This exam was performed according to the departmental dose-optimization program which includes automated exposure control, adjustment of the mA and/or kV according to patient size and/or use of iterative reconstruction technique. COMPARISON:  None Available. FINDINGS: Osseous: No fracture or mandibular dislocation. No destructive process. Note is made of advanced degenerative changes in the visualized cervical spine. Orbits: Negative. No traumatic or inflammatory finding. Sinuses: No air-fluid levels. Soft tissues: Mild soft tissue swelling in the right maxillary region. Limited intracranial: No acute findings. IMPRESSION: 1. No acute fracture identified. 2. Mild right maxillary soft tissue swelling. Electronically Signed   By: Ofilia Neas M.D.   On: 04/01/2022 20:52   ? ?Review of Systems  ?Constitutional:  Negative for activity change, appetite change, chills  and fever.  ?HENT:  Negative for congestion, facial swelling, hearing loss, rhinorrhea and sore throat.   ?     Abrasion on right forehead  ?Eyes: Negative.   ?Respiratory:  Negative for cough, chest tightn

## 2022-04-02 ENCOUNTER — Inpatient Hospital Stay (HOSPITAL_COMMUNITY): Payer: Medicare Other

## 2022-04-02 DIAGNOSIS — I6200 Nontraumatic subdural hemorrhage, unspecified: Secondary | ICD-10-CM | POA: Diagnosis not present

## 2022-04-02 DIAGNOSIS — Z7901 Long term (current) use of anticoagulants: Secondary | ICD-10-CM | POA: Diagnosis not present

## 2022-04-02 DIAGNOSIS — S066X0A Traumatic subarachnoid hemorrhage without loss of consciousness, initial encounter: Secondary | ICD-10-CM | POA: Diagnosis not present

## 2022-04-02 DIAGNOSIS — S065X0A Traumatic subdural hemorrhage without loss of consciousness, initial encounter: Secondary | ICD-10-CM | POA: Diagnosis not present

## 2022-04-02 LAB — GLUCOSE, CAPILLARY
Glucose-Capillary: 161 mg/dL — ABNORMAL HIGH (ref 70–99)
Glucose-Capillary: 206 mg/dL — ABNORMAL HIGH (ref 70–99)

## 2022-04-02 LAB — HEMOGLOBIN A1C
Hgb A1c MFr Bld: 7.9 % — ABNORMAL HIGH (ref 4.8–5.6)
Mean Plasma Glucose: 180.03 mg/dL

## 2022-04-02 LAB — MRSA NEXT GEN BY PCR, NASAL: MRSA by PCR Next Gen: NOT DETECTED

## 2022-04-02 LAB — PROTIME-INR
INR: 1.3 — ABNORMAL HIGH (ref 0.8–1.2)
Prothrombin Time: 15.8 s — ABNORMAL HIGH (ref 11.4–15.2)

## 2022-04-02 MED ORDER — WARFARIN SODIUM 5 MG PO TABS: ORAL_TABLET | ORAL | 3 refills | Status: DC

## 2022-04-02 MED ORDER — LEVETIRACETAM 500 MG PO TABS: 500.0000 mg | ORAL_TABLET | Freq: Two times a day (BID) | ORAL | 0 refills | Status: DC

## 2022-04-02 NOTE — TOC CAGE-AID Note (Signed)
Transition of Care (TOC) - CAGE-AID Screening ? ? ?Patient Details  ?Name: [name] ?MRN: [id number] ?Date of Birth: Nov 11, 1941 ? ?Transition of Care (TOC) CM/SW Contact:    ?Ajai Harville C Tarpley-Carter, LCSWA ?Phone Number: ?04/02/2022, 9:54 AM ? ? ?Clinical Narrative: ?Pt participated in [place].  Pt stated he does not use substance or ETOH.  Pt was not offered resources, due to no usage of substance or ETOH.   ? ?Passenger transport manager, MSW, LCSW-A ?Pronouns:  She/Her/Hers ?[facility] HealthTransitions of Care ?Clinical Social Worker ?Direct Number:  [phone] ?[email or web address] ? ?CAGE-AID Screening: ?  ? ?Have You Ever Felt You Ought to Cut Down on Your Drinking or Drug Use?: No ?Have People Annoyed You By Critizing Your Drinking Or Drug Use?: No ?Have You Felt Bad Or Guilty About Your Drinking Or Drug Use?: No ?Have You Ever Had a Drink or Used Drugs First Thing In The Morning to Steady Your Nerves or to Get Rid of a Hangover?: No ?CAGE-AID Score: 0 ? ?Substance Abuse Education Offered: No ? ?  ? ? ? ? ? ? ?

## 2022-04-02 NOTE — Care Management CC44 (Signed)
Condition Code 44 Documentation Completed ? ?Patient Details  ?Name: [name] ?MRN: [id number] ?Date of Birth: 1941-03-21 ? ? ?Condition Code 44 given:  Yes ?Patient signature on Condition Code 44 notice:  Yes ?Documentation of 2 MD's agreement:  Yes ?Code 44 added to claim:  Yes ? ? ? ?Ella Bodo, RN ?04/02/2022, 3:35 PM ? ?

## 2022-04-02 NOTE — Discharge Summary (Signed)
Physician Discharge Summary  ?Patient ID: ?[name] ?MRN: [id number] ?DOB/AGE: 1941/06/19 81 y.o. ? ?Admit date: 04/01/2022 ?Discharge date: 04/02/2022 ? ?Admission Diagnoses: Left triquetral fracture, SDH, SAH ? ?Discharge Diagnoses: Left triquetral fracture, SDH, SAH ?Principal Problem: ?  Subdural hematoma ([place]) ? ? ?Discharged Condition: good ? ?Hospital Course: The patient was admitted on 04/01/2022 after presenting to the [facility] ED after a mechanical fall. The patient is on Coumadin for atrial fibrillation.  Workup in the emergency department revealed a left wrist fracture as well as small subdural hematoma and subarachnoid hemorrhage.  Since the patient is on Coumadin, his Coumadin was reversed and he was urgently transferred to the neurological intensive care unit at [facility] for close neurological monitoring.  The patient will follow-up as an outpatient for his left wrist fracture.  His left wrist is immobilized with a volar splint.  His neurological exam remained at his baseline through the night.  Follow-up CT scan this morning was stable and showed no increase in the volume of the subdural worse of arachnoid hemorrhage in the left frontal region.  The patient remained afebrile with stable vital signs, and tolerated a regular diet. The patient continued to increase activities, and pain was well controlled with oral pain medications. At this time he is appropriate for discharge.  We will plan to restart his Coumadin in 1 week.  He will follow-up as an outpatient and 2 weeks. ? ? ?Consults: None ? ?Significant Diagnostic Studies: radiology: CT scan: CT head ? ?Treatments: Coumadin reversal with Feiba ? ?Discharge Exam: ?Blood pressure 97/63, pulse (!) 52, temperature 97.8 ?F (36.6 ?C), temperature source Oral, resp. rate 15, height '5\' 11"'$  (1.803 m), weight 97.1 kg, SpO2 95 %. ?Physical Exam: Patient is awake, A/O X 4, conversant, and in good spirits. Eyes open spontaneously. They are in NAD and VSS.  Doing well. Speech is fluent and appropriate. MAEW with good strength that is symmetric bilaterally.  BUE 5/5 throughout, BLE 5/5 throughout. Sensation to light touch is intact. PERLA, EOMI. CNs grossly intact.  ? ? ? ? ?Disposition: Discharge disposition: 01-Home or Self Care ? ? ? ? ? ? ? ?Allergies as of 04/02/2022   ?No Known Allergies ?  ? ?  ?Medication List  ?  ? ?TAKE these medications   ? ?Accu-Chek FastClix Lancets Misc ?1 application by Does not apply route 2 (two) times daily. ?  ?allopurinol 100 MG tablet ?Commonly known as: ZYLOPRIM ?Take 100 mg by mouth daily. ?  ?ALPRAZolam 1 MG tablet ?Commonly known as: Duanne Moron ?Take 0.5 mg by mouth at bedtime as needed for anxiety or sleep. ?  ?DULoxetine 60 MG capsule ?Commonly known as: CYMBALTA ?Take 60 mg by mouth daily. ?  ?escitalopram 10 MG tablet ?Commonly known as: LEXAPRO ?Take 10 mg by mouth daily. ?  ?fexofenadine 180 MG tablet ?Commonly known as: ALLEGRA ?Take 180 mg by mouth daily. ?  ?finasteride 5 MG tablet ?Commonly known as: PROSCAR ?Take 1 tablet (5 mg total) by mouth daily. ?  ?gabapentin 300 MG capsule ?Commonly known as: NEURONTIN ?Take 300 mg by mouth 2 (two) times daily. ?  ?glipiZIDE 5 MG tablet ?Commonly known as: GLUCOTROL ?Take 5 mg by mouth daily. ?  ?glucose blood test strip ?1 each by Other route daily before breakfast. Use as instructed ?  ?HYDROcodone-acetaminophen 5-325 MG tablet ?Commonly known as: NORCO/VICODIN ?Take 1 tablet by mouth 4 (four) times daily as needed. ?  ?levETIRAcetam 500 MG tablet ?Commonly known as:  Keppra ?Take 1 tablet (500 mg total) by mouth 2 (two) times daily. ?  ?metFORMIN 500 MG 24 hr tablet ?Commonly known as: GLUCOPHAGE-XR ?Take 2 tablets (1,000 mg total) by mouth daily with breakfast. ?  ?pantoprazole 40 MG tablet ?Commonly known as: PROTONIX ?TAKE ONE TABLET ('40MG'$  TOTAL) BY MOUTH TWO TIMES DAILY BEFORE A MEAL ?  ?rosuvastatin 10 MG tablet ?Commonly known as: CRESTOR ?Take 1 tablet (10 mg total) by mouth  daily. ?  ?sildenafil 50 MG tablet ?Commonly known as: VIAGRA ?Take 50 mg by mouth daily. ?  ?tadalafil 5 MG tablet ?Commonly known as: CIALIS ?Take 1 tablet (5 mg total) by mouth daily as needed for erectile dysfunction. ?  ?tamsulosin 0.4 MG Caps capsule ?Commonly known as: FLOMAX ?Take 1 capsule (0.4 mg total) by mouth 2 (two) times daily. ?  ?traMADol 50 MG tablet ?Commonly known as: ULTRAM ?Take 1 tablet (50 mg total) by mouth every 6 (six) hours as needed for moderate pain (pain.). ?  ?Vitamin D3 50 MCG (2000 UT) Tabs ?Take 2,000 Units by mouth daily. ?  ?warfarin 5 MG tablet ?Commonly known as: COUMADIN ?Take as directed. If you are unsure how to take this medication, talk to your nurse or doctor. ?Original instructions: TAKE ONE TABLET BY MOUTH DAILY EXCEPT TAKE ONE AND ONE-HALF TABLETS ON SATURDAY OR AS DIRECTED ?Start taking on: Apr 09, 2022 ?What changed: These instructions start on Apr 09, 2022. If you are unsure what to do until then, ask your doctor or other care provider. ?  ? ?  ? ? ? ?Signed: ?Marvis Moeller, DNP, NP-C ?04/02/2022, 4:39 PM ? ? ?

## 2022-04-02 NOTE — Evaluation (Signed)
Occupational Therapy Evaluation ?Patient Details ?Name: [name] ?MRN: [id number] ?DOB: 11-24-1940 ?Today's Date: 04/02/2022 ? ? ?History of Present Illness Pt is a 81 y.o. M who presents 04/01/2022 after a fall with an acute left frontal SDH and SAH and left triquetral fracture. Significant PMH: afib, DM2, HTN, CAD, vertigo.  ? ?Clinical Impression ?  ?[name] was evaluated s/p the above fall, he is indep at baseline and lives alone. Pt demonstrated supervision mobility and transfers without AD. He requires minimal assist for ADLs due to limited elbow and digit ROM and L wrist pain and precautions. Reviewed compensatory techniques such as sponge bathing, LB clothing with elastic waistbands, using a bag to carry things, and reviewed NWB restrictions. Encouraged shoulder, elbow and digit AROM, pt demonstrated great understanding. Pt with plans to d/c home today. Recommend pt to follow Mds orders for follow up therapy, pt will likely benefit from OP OT.   ?   ? ?Recommendations for follow up therapy are one component of a multi-disciplinary discharge planning process, led by the attending physician.  Recommendations may be updated based on patient status, additional functional criteria and insurance authorization.  ? ?Follow Up Recommendations ? Follow physician's recommendations for discharge plan and follow up therapies (OP OT)  ?  ?Assistance Recommended at Discharge Frequent or constant Supervision/Assistance  ?   ?Functional Status Assessment ? Patient has had a recent decline in their functional status and demonstrates the ability to make significant improvements in function in a reasonable and predictable amount of time.  ?Equipment Recommendations ? None recommended by OT  ?  ?   ?Precautions / Restrictions Precautions ?Precautions: Fall ?Restrictions ?Weight Bearing Restrictions: Yes ?LUE Weight Bearing: Non weight bearing  ? ?  ? ?Mobility Bed Mobility ?Overal bed mobility: Modified Independent ?  ?  ?  ?   ?  ?  ?General bed mobility comments: pt OOB upon arrival ?  ? ?Transfers ?Overall transfer level: Independent ?Equipment used: None ?  ?  ?  ?  ?  ?  ?  ?  ?  ? ?  ?Balance Overall balance assessment: Mild deficits observed, not formally tested ?  ?  ?  ?  ?  ?  ?  ?  ?  ?  ?  ?  ?  ?  ?  ?  ?  ?  ?   ? ?ADL either performed or assessed with clinical judgement  ? ?ADL Overall ADL's : Needs assistance/impaired ?Eating/Feeding: Set up;Sitting ?  ?Grooming: Supervision/safety;Standing ?  ?Upper Body Bathing: Set up;Sitting ?  ?Lower Body Bathing: Supervison/ safety;Sit to/from stand ?  ?Upper Body Dressing : Set up;Sitting ?  ?Lower Body Dressing: Supervision/safety;Sit to/from stand ?  ?Toilet Transfer: Supervision/safety;Ambulation ?  ?Toileting- Clothing Manipulation and Hygiene: Supervision/safety;Sitting/lateral lean ?  ?  ?  ?Functional mobility during ADLs: Supervision/safety ?General ADL Comments: cues for problem solving and compensatory techniques  ? ? ? ?Vision Baseline Vision/History: 0 No visual deficits ?Ability to See in Adequate Light: 0 Adequate ?Vision Assessment?: No apparent visual deficits  ?   ?   ?   ? ?Pertinent Vitals/Pain Pain Assessment ?Pain Assessment: Faces ?Faces Pain Scale: Hurts little more ?Pain Location: L wrist, elbow with attempts at flexion with cast restriction ?Pain Descriptors / Indicators: Grimacing, Guarding ?Pain Intervention(s): Limited activity within patient's tolerance, Monitored during session  ? ? ? ?Hand Dominance Right ?  ?Extremity/Trunk Assessment Upper Extremity Assessment ?Upper Extremity Assessment: LUE deficits/detail ?LUE Deficits / Details: full  shoulder AROM, elbow ROM is restricted due to the cast, wrist immobilized and digits are restricted due to the case. pt reports they are numb/tingly, and painful to [facility] ?LUE Sensation: decreased light touch ?LUE Coordination: decreased fine motor;decreased gross motor ?  ?Lower Extremity Assessment ?Lower Extremity  Assessment: Defer to PT evaluation ?  ?Cervical / Trunk Assessment ?Cervical / Trunk Assessment: Normal ?  ?Communication Communication ?Communication: No difficulties ?  ?Cognition Arousal/Alertness: Awake/alert ?Behavior During Therapy: [facility] for tasks assessed/performed ?Overall Cognitive Status: Within Functional Limits for tasks assessed ?  ?  ?  ?  ?  ?  ?  ?  ?  ?  ?  ?  ?  ?  ?  ?  ?  ?  ?  ?General Comments  VSS on RA ? ?  ?   ?   ? ? ?Home Living Family/patient expects to be discharged to:: Private residence ?Living Arrangements: Alone ?Available Help at Discharge: Family;Available PRN/intermittently ?Type of Home: House ?Home Access: Stairs to enter ?Entrance Stairs-Number of Steps: 4 ?Entrance Stairs-Rails: Right ?Home Layout: Able to live on main level with bedroom/bathroom ?  ?  ?Bathroom Shower/Tub: Tub/shower unit ?  ?  ?  ?  ?Home Equipment: Cane - single point;Grab bars - tub/shower ?  ?  ?  ? ?  ?Prior Functioning/Environment Prior Level of Function : Independent/Modified Independent;Driving ?  ?  ?  ?  ?  ?  ?Mobility Comments: Enjoys playing golf and with his grandchildren ?  ?  ? ?  ?  ?OT Problem List: Decreased range of motion;Decreased strength;Impaired balance (sitting and/or standing);Decreased activity tolerance;Decreased safety awareness;Decreased knowledge of use of DME or AE;Decreased knowledge of precautions;Pain;Impaired UE functional use ?  ?   ?OT Treatment/Interventions:    ?  ?OT Goals(Current goals can be found in the care plan section) Acute Rehab OT Goals ?Patient Stated Goal: home ?OT Goal Formulation: With patient ?Time For Goal Achievement: 04/16/22 ?Potential to Achieve Goals: Good  ?   ? ?   ?AM-PAC OT "6 Clicks" Daily Activity     ?Outcome Measure Help from another person eating meals?: A Little ?Help from another person taking care of personal grooming?: A Little ?Help from another person toileting, which includes using toliet, bedpan, or urinal?: A Little ?Help from  another person bathing (including washing, rinsing, drying)?: A Little ?Help from another person to put on and taking off regular upper body clothing?: A Little ?Help from another person to put on and taking off regular lower body clothing?: A Little ?6 Click Score: 18 ?  ?End of Session Nurse Communication: Mobility status ? ?Activity Tolerance: Patient tolerated treatment well ?Patient left: in chair;with call bell/phone within reach;with chair alarm set ? ?OT Visit Diagnosis: Muscle weakness (generalized) (M62.81);History of falling (Z91.81);Pain  ?              ?Time: 1340-1400 ?OT Time Calculation (min): 20 min ?Charges:  OT General Charges ?$OT Visit: 1 Visit ?OT Evaluation ?$OT Eval Low Complexity: 1 Low ? ? ?Suzi Hernan A Tamella Tuccillo ?04/02/2022, 2:12 PM ?

## 2022-04-02 NOTE — Care Management Obs Status (Signed)
MEDICARE OBSERVATION STATUS NOTIFICATION ? ? ?Patient Details  ?Name: [name] ?MRN: [id number] ?Date of Birth: 05-24-1941 ? ? ?Medicare Observation Status Notification Given:  Yes ? ? ? ?Ella Bodo, RN ?04/02/2022, 3:35 PM ?

## 2022-04-02 NOTE — Progress Notes (Signed)
Discharge instructions given to patient and son. Peripheral IV's removed.  ?

## 2022-04-02 NOTE — Evaluation (Signed)
Physical Therapy Evaluation and Discharge ?Patient Details ?Name: [name] ?MRN: [id number] ?DOB: 07/22/1941 ?Today's Date: 04/02/2022 ? ?History of Present Illness ? Pt is a 81 y.o. M who presents 04/01/2022 after a fall with an acute left frontal SDH and SAH and left triquetral fracture. Significant PMH: afib, DM2, HTN, CAD, vertigo.  ?Clinical Impression ? Patient evaluated by Physical Therapy with no further acute PT needs identified. PTA, pt lives alone and is independent. Pt appears to be fairly close to his functional baseline. Ambulating 250 ft with no assistive device and negotiated a half flight of stairs with right railing without physical assist or difficulty. Pt reports he has family assist as needed due to decreased functional use of LUE currently. All education has been completed and the patient has no further questions. No follow-up Physical Therapy or equipment needs. PT is signing off. Thank you for this referral. ? ?   ? ?Recommendations for follow up therapy are one component of a multi-disciplinary discharge planning process, led by the attending physician.  Recommendations may be updated based on patient status, additional functional criteria and insurance authorization. ? ?Follow Up Recommendations No PT follow up ? ?  ?Assistance Recommended at Discharge Intermittent Supervision/Assistance  ?Patient can return home with the following ? Assistance with cooking/housework;Assist for transportation ? ?  ?Equipment Recommendations None recommended by PT  ?Recommendations for Other Services ?    ?  ?Functional Status Assessment Patient has had a recent decline in their functional status and demonstrates the ability to make significant improvements in function in a reasonable and predictable amount of time.  ? ?  ?Precautions / Restrictions Precautions ?Precautions: Fall ?Restrictions ?Weight Bearing Restrictions: Yes ?LUE Weight Bearing: Non weight bearing  ? ?  ? ?Mobility ? Bed Mobility ?Overal  bed mobility: Modified Independent ?  ?  ?  ?  ?  ?  ?General bed mobility comments: HOB slightly elevated ?  ? ?Transfers ?Overall transfer level: Independent ?Equipment used: None ?  ?  ?  ?  ?  ?  ?  ?  ?  ? ?Ambulation/Gait ?Ambulation/Gait assistance: Modified independent (Device/Increase time) (increased time) ?Gait Distance (Feet): 250 Feet ?Assistive device: None ?Gait Pattern/deviations: Step-through pattern, Decreased stride length ?Gait velocity: decreased ?  ?  ?General Gait Details: Mild dynamic instability, but overall modI and seems to be close to baseline ? ?Stairs ?Stairs: Yes ?Stairs assistance: Supervision ?Stair Management: One rail Right ?Number of Stairs: 12 ?General stair comments: Step by step vs step over step pattern, supervision for safety ? ?Wheelchair Mobility ?  ? ?Modified Rankin (Stroke Patients Only) ?  ? ?  ? ?Balance Overall balance assessment: Mild deficits observed, not formally tested ?  ?  ?  ?  ?  ?  ?  ?  ?  ?  ?  ?  ?  ?  ?  ?  ?  ?  ?   ? ? ? ?Pertinent Vitals/Pain Pain Assessment ?Pain Assessment: Faces ?Faces Pain Scale: Hurts a little bit ?Pain Location: L wrist, elbow with attempts at flexion with cast restriction ?Pain Descriptors / Indicators: Grimacing, Guarding ?Pain Intervention(s): Monitored during session  ? ? ?Home Living Family/patient expects to be discharged to:: Private residence ?Living Arrangements: Alone ?Available Help at Discharge: Family;Available PRN/intermittently ?Type of Home: House ?Home Access: Stairs to enter ?Entrance Stairs-Rails: Right ?Entrance Stairs-Number of Steps: 4 ?  ?Home Layout: Able to live on main level with bedroom/bathroom ?Home Equipment: Cane - single point;Grab  bars - tub/shower ?   ?  ?Prior Function Prior Level of Function : Independent/Modified Independent;Driving ?  ?  ?  ?  ?  ?  ?Mobility Comments: Enjoys playing golf and with his grandchildren ?  ?  ? ? ?Hand Dominance  ? Dominant Hand: Right ? ?  ?Extremity/Trunk  Assessment  ? Upper Extremity Assessment ?Upper Extremity Assessment: Defer to OT evaluation ?  ? ?Lower Extremity Assessment ?Lower Extremity Assessment: Overall WFL for tasks assessed ?  ? ?Cervical / Trunk Assessment ?Cervical / Trunk Assessment: Normal  ?Communication  ? Communication: No difficulties  ?Cognition Arousal/Alertness: Awake/alert ?Behavior During Therapy: [facility] for tasks assessed/performed ?Overall Cognitive Status: Within Functional Limits for tasks assessed ?  ?  ?  ?  ?  ?  ?  ?  ?  ?  ?  ?  ?  ?  ?  ?  ?  ?  ?  ? ?  ?General Comments   ? ?  ?Exercises    ? ?Assessment/Plan  ?  ?PT Assessment Patient does not need any further PT services  ?PT Problem List   ? ?   ?  ?PT Treatment Interventions     ? ?PT Goals (Current goals can be found in the Care Plan section)  ?Acute Rehab PT Goals ?Patient Stated Goal: return to playing golf ?PT Goal Formulation: All assessment and education complete, DC therapy ? ?  ?Frequency   ?  ? ? ?Co-evaluation   ?  ?  ?  ?  ? ? ?  ?AM-PAC PT "6 Clicks" Mobility  ?Outcome Measure Help needed turning from your back to your side while in a flat bed without using bedrails?: None ?Help needed moving from lying on your back to sitting on the side of a flat bed without using bedrails?: None ?Help needed moving to and from a bed to a chair (including a wheelchair)?: None ?Help needed standing up from a chair using your arms (e.g., wheelchair or bedside chair)?: None ?Help needed to walk in hospital room?: None ?Help needed climbing 3-5 steps with a railing? : A Little ?6 Click Score: 23 ? ?  ?End of Session Equipment Utilized During Treatment: Gait belt ?Activity Tolerance: Patient tolerated treatment well ?Patient left: in chair;with call bell/phone within reach ?Nurse Communication: Mobility status ?PT Visit Diagnosis: Unsteadiness on feet (R26.81) ?  ? ?Time: 6073-7106 ?PT Time Calculation (min) (ACUTE ONLY): 31 min ? ? ?Charges:   PT Evaluation ?$PT Eval Low Complexity: 1  Low ?PT Treatments ?$Therapeutic Activity: 8-22 mins ?  ?   ? ? ?Wyona Almas, PT, DPT ?Acute Rehabilitation Services ?Pager [phone] ?Office [phone] ? ? ?Carloine Margo Aye ?04/02/2022, 1:33 PM ? ?

## 2022-04-03 ENCOUNTER — Other Ambulatory Visit: Payer: Self-pay | Admitting: Obstetrics & Gynecology

## 2022-04-04 ENCOUNTER — Other Ambulatory Visit (HOSPITAL_COMMUNITY): Payer: Self-pay | Admitting: Neurosurgery

## 2022-04-04 ENCOUNTER — Other Ambulatory Visit: Payer: Self-pay | Admitting: Neurosurgery

## 2022-04-04 DIAGNOSIS — S065XAA Traumatic subdural hemorrhage with loss of consciousness status unknown, initial encounter: Secondary | ICD-10-CM

## 2022-04-05 DIAGNOSIS — E663 Overweight: Secondary | ICD-10-CM | POA: Diagnosis not present

## 2022-04-05 DIAGNOSIS — S065XAA Traumatic subdural hemorrhage with loss of consciousness status unknown, initial encounter: Secondary | ICD-10-CM | POA: Diagnosis not present

## 2022-04-05 DIAGNOSIS — I480 Paroxysmal atrial fibrillation: Secondary | ICD-10-CM | POA: Diagnosis not present

## 2022-04-05 DIAGNOSIS — I700 Atherosclerosis of aorta: Secondary | ICD-10-CM | POA: Diagnosis not present

## 2022-04-05 DIAGNOSIS — Z6827 Body mass index (BMI) 27.0-27.9, adult: Secondary | ICD-10-CM | POA: Diagnosis not present

## 2022-04-05 DIAGNOSIS — E1140 Type 2 diabetes mellitus with diabetic neuropathy, unspecified: Secondary | ICD-10-CM | POA: Diagnosis not present

## 2022-04-05 DIAGNOSIS — J9801 Acute bronchospasm: Secondary | ICD-10-CM | POA: Diagnosis not present

## 2022-04-05 DIAGNOSIS — E782 Mixed hyperlipidemia: Secondary | ICD-10-CM | POA: Diagnosis not present

## 2022-04-05 DIAGNOSIS — J329 Chronic sinusitis, unspecified: Secondary | ICD-10-CM | POA: Diagnosis not present

## 2022-04-05 DIAGNOSIS — I2510 Atherosclerotic heart disease of native coronary artery without angina pectoris: Secondary | ICD-10-CM | POA: Diagnosis not present

## 2022-04-09 ENCOUNTER — Encounter: Payer: Self-pay | Admitting: Orthopedic Surgery

## 2022-04-09 ENCOUNTER — Ambulatory Visit (INDEPENDENT_AMBULATORY_CARE_PROVIDER_SITE_OTHER): Payer: Medicare Other | Admitting: Orthopedic Surgery

## 2022-04-09 VITALS — BP 117/70 | HR 72 | Ht 72.0 in | Wt 210.0 lb

## 2022-04-09 DIAGNOSIS — S62112A Displaced fracture of triquetrum [cuneiform] bone, left wrist, initial encounter for closed fracture: Secondary | ICD-10-CM

## 2022-04-09 NOTE — Progress Notes (Signed)
New Patient Visit  Assessment: [name] is a 81 y.o. male with the following: 1. Closed chip fracture of triquetrum of left wrist, initial encounter  Plan: [name] sustained a minimally displaced fracture, dorsal aspect of the triquetrum.  He was in a short arm splint, and this was transition to a removable wrist brace.  He has some stiffness, and swelling in his fingers.  I have encouraged him to work on his range of motion, and elevate the hand is much as possible.  Medications as needed.  Brace at all times with exception of hygiene, and range of motion activities.  Focus on range of motion.  No lifting anything heavier than a coffee cup.  Follow-up in 4 weeks.  Follow-up: Return in about 4 weeks (around 05/07/2022).  Subjective:  Chief Complaint  Patient presents with   Wrist Injury    Left, DOI 04/01/22- fell chasing after grandson, hit face, head had a brain bleed and fractured wrist    History of Present Illness: [name] is a 82 y.o. male who presents for evaluation of left hand pain.  Approximately 1 week ago, he was chasing his grandson, when he fell.  He sustained multiple injuries, including brain bleeding.  This has resolved without issues.  He has a small injury to the left wrist.  He was placed in a short arm splint.  He felt as though this was too tight.  He has had limited mobility with his fingers.  He is hopeful to have this adjusted and downsized today.  He denies pain in the left hand.  Some mild numbness and tingling in the left thumb.   Review of Systems: No fevers or chills + numbness and tingling No chest pain No shortness of breath No bowel or bladder dysfunction No GI distress No headaches   Medical History:  Past Medical History:  Diagnosis Date   Aortic atherosclerosis (HCC)    Barrett's esophagus    Short-segment, diagnosed in 2007 by Dr. [name]   BPH (benign prostatic hyperplasia)    CAD (coronary artery disease)    a. cath on  07/21/2019 showing patent LM and LCx with 40-60% Proximal LAD and 30-40% Pro-RCA stenosis with normal LV function.   Essential hypertension    GERD (gastroesophageal reflux disease)    Hiatal hernia    History of kidney stones    PAF (paroxysmal atrial fibrillation) ([place])    S/P colonoscopy 2007   S/P endoscopy 08/2010    Salmon-colored epithelium coming up to 37 cm from the    Type 2 diabetes mellitus ([address])    Vertigo     Past Surgical History:  Procedure Laterality Date   APPENDECTOMY     BIOPSY  01/28/2020   Procedure: BIOPSY;  Surgeon: [name], MD;  [facility];  Service: Endoscopy;;  esophagus   CHOLECYSTECTOMY     COLONOSCOPY  02/06/2006   Dr. [name]- single anal papilla, o/w normal rectum, normal colon   COLONOSCOPY N/A 09/13/2016   RMR: three 5-6 mm for adenomas removed.  Recommend one last surveillance colonoscopy in October 2020.   COLONOSCOPY WITH PROPOFOL N/A 01/28/2020   Procedure: COLONOSCOPY WITH PROPOFOL;  Surgeon: [name], MD;  [facility];  Service: Endoscopy;  Laterality: N/A;  10:45am   CYSTOSCOPY WITH RETROGRADE PYELOGRAM, URETEROSCOPY AND STENT PLACEMENT Right 08/18/2020   Procedure: CYSTOSCOPY WITH INCISION OF RIGHT URETEROCELE, URETEROSCOPY WITH HOLMIUM LASER, CYSTOLITHOLAPAXY OF 10 MM BLADDER STONE;  Surgeon: [name], MD;  [facility];  Service: Urology;  Laterality: Right;   ESOPHAGOGASTRODUODENOSCOPY  09/15/2010   Dr. [name]- Salmon-colored epithelium coming up to 37 cm from the    ESOPHAGOGASTRODUODENOSCOPY N/A 07/02/2013   Dr. [name]- Barretts on bx. hiatal hernia   ESOPHAGOGASTRODUODENOSCOPY N/A 09/13/2016   RMR: Barrett's esophagus without dysplasia. one last EGD in 3 years.    ESOPHAGOGASTRODUODENOSCOPY (EGD) WITH PROPOFOL N/A 01/28/2020   Procedure: ESOPHAGOGASTRODUODENOSCOPY (EGD) WITH PROPOFOL;  Surgeon: [name], MD;  [facility];  Service: Endoscopy;  Laterality: N/A;   INTRAVASCULAR  PRESSURE WIRE/FFR STUDY N/A 07/21/2019   Procedure: INTRAVASCULAR PRESSURE WIRE/FFR STUDY;  Surgeon: [name], MD;  Location: [facility];  Service: Cardiovascular;  Laterality: N/A;   LEFT HEART CATH AND CORONARY ANGIOGRAPHY N/A 07/21/2019   Procedure: LEFT HEART CATH AND CORONARY ANGIOGRAPHY;  Surgeon: [name], MD;  Location: [facility];  Service: Cardiovascular;  Laterality: N/A;   POLYPECTOMY  01/28/2020   Procedure: POLYPECTOMY;  Surgeon: [name], MD;  [facility];  Service: Endoscopy;;  colon   ROTATOR CUFF REPAIR     YAG LASER APPLICATION Right 11/26/15   Procedure: YAG LASER APPLICATION;  Surgeon: [name], MD;  [facility];  Service: Ophthalmology;  Laterality: Right;    Family History  Problem Relation Age of Onset   Prostate cancer Father        deceased   Stroke Mother        deceased   Colon cancer Neg Hx    Social History   Tobacco Use   Smoking status: Former    Packs/day: 1.50    Years: 30.00    Pack years: 45.00    Types: Cigarettes    Quit date: 11/18/1993    Years since quitting: 28.4   Smokeless tobacco: Never   Tobacco comments:    quit in the 90's  Vaping Use   Vaping Use: Never used  Substance Use Topics   Alcohol use: No   Drug use: No    No Known Allergies  Current Meds  Medication Sig   Accu-Chek FastClix Lancets MISC 1 application by Does not apply route 2 (two) times daily.   allopurinol (ZYLOPRIM) 100 MG tablet Take 100 mg by mouth daily.    ALPRAZolam (XANAX) 1 MG tablet Take 0.5 mg by mouth at bedtime as needed for anxiety or sleep.   Cholecalciferol (VITAMIN D3) 50 MCG (2000 UT) TABS Take 2,000 Units by mouth daily.   DULoxetine (CYMBALTA) 60 MG capsule Take 60 mg by mouth daily.   escitalopram (LEXAPRO) 10 MG tablet Take 10 mg by mouth daily.   fexofenadine (ALLEGRA) 180 MG tablet Take 180 mg by mouth daily.   finasteride (PROSCAR) 5 MG tablet Take 1 tablet (5 mg total) by mouth  daily.   gabapentin (NEURONTIN) 300 MG capsule Take 300 mg by mouth 2 (two) times daily.    glipiZIDE (GLUCOTROL) 5 MG tablet Take 5 mg by mouth daily.   glucose blood test strip 1 each by Other route daily before breakfast. Use as instructed   HYDROcodone-acetaminophen (NORCO/VICODIN) 5-325 MG tablet Take 1 tablet by mouth 4 (four) times daily as needed for moderate pain.   levETIRAcetam (KEPPRA) 500 MG tablet Take 1 tablet (500 mg total) by mouth 2 (two) times daily.   metFORMIN (GLUCOPHAGE-XR) 500 MG 24 hr tablet Take 2 tablets (1,000 mg total) by mouth daily with  breakfast.   pantoprazole (PROTONIX) 40 MG tablet TAKE ONE TABLET ('40MG'$  TOTAL) BY MOUTH TWO TIMES DAILY BEFORE A MEAL (Patient taking differently: Take 40 mg by mouth 2 (two) times daily.)   rosuvastatin (CRESTOR) 10 MG tablet Take 1 tablet (10 mg total) by mouth daily.   sildenafil (VIAGRA) 50 MG tablet Take 50 mg by mouth daily.   tadalafil (CIALIS) 5 MG tablet Take 1 tablet (5 mg total) by mouth daily as needed for erectile dysfunction.   tamsulosin (FLOMAX) 0.4 MG CAPS capsule Take 1 capsule (0.4 mg total) by mouth 2 (two) times daily.   traMADol (ULTRAM) 50 MG tablet Take 1 tablet (50 mg total) by mouth every 6 (six) hours as needed for moderate pain (pain.).   warfarin (COUMADIN) 5 MG tablet TAKE ONE TABLET BY MOUTH DAILY EXCEPT TAKE ONE AND ONE-HALF TABLETS ON SATURDAY OR AS DIRECTED    Objective: BP 117/70   Pulse 72   Ht 6' (1.829 m)   Wt 210 lb (95.3 kg)   BMI 28.48 kg/m   Physical Exam:  General: Elderly male., Alert and oriented., and No acute distress. Gait: Normal gait.  After removal of the splint, left arm and hand is without any skin breakdown.  He does have some ecchymosis about the wrist.  The fingers are swollen.  He has developed some stiffness in all of his fingers.  Slightly decreased sensation to the thumb.  Minimal tenderness to palpation over the dorsum of his hand.  Pain with passive range of  motion of his left wrist.  IMAGING: I personally reviewed images previously obtained from the ED  Small avulsion fracture off the dorsal aspect of the left triquetrum  New Medications:  No orders of the defined types were placed in this encounter.     Mordecai Rasmussen, MD  04/09/2022 4:23 PM

## 2022-04-09 NOTE — Patient Instructions (Signed)
Brace at all times except for hygiene and working on range of motion.  Elevate the hand as much as possible to help with swelling  No lifting anything greater than a coffee cup   Call or return sooner if issues  F/u 4 weeks

## 2022-04-12 ENCOUNTER — Ambulatory Visit (INDEPENDENT_AMBULATORY_CARE_PROVIDER_SITE_OTHER): Payer: Medicare Other | Admitting: *Deleted

## 2022-04-12 DIAGNOSIS — Z5181 Encounter for therapeutic drug level monitoring: Secondary | ICD-10-CM | POA: Diagnosis not present

## 2022-04-12 DIAGNOSIS — I4891 Unspecified atrial fibrillation: Secondary | ICD-10-CM | POA: Diagnosis not present

## 2022-04-12 NOTE — Patient Instructions (Signed)
Description   Continue to take  1 tablet daily except 1 1/2 tablets on Saturdays Recheck INR in 1 wk Call us with any new medications, changes or any bleeding concerns @ [phone].

## 2022-04-17 ENCOUNTER — Ambulatory Visit (INDEPENDENT_AMBULATORY_CARE_PROVIDER_SITE_OTHER): Payer: Medicare Other | Admitting: *Deleted

## 2022-04-17 ENCOUNTER — Encounter: Payer: Self-pay | Admitting: Cardiology

## 2022-04-17 ENCOUNTER — Ambulatory Visit (INDEPENDENT_AMBULATORY_CARE_PROVIDER_SITE_OTHER): Payer: Medicare Other | Admitting: Cardiology

## 2022-04-17 VITALS — BP 112/66 | HR 64 | Ht 72.0 in | Wt 214.8 lb

## 2022-04-17 DIAGNOSIS — I2510 Atherosclerotic heart disease of native coronary artery without angina pectoris: Secondary | ICD-10-CM

## 2022-04-17 DIAGNOSIS — Z5181 Encounter for therapeutic drug level monitoring: Secondary | ICD-10-CM

## 2022-04-17 DIAGNOSIS — I480 Paroxysmal atrial fibrillation: Secondary | ICD-10-CM

## 2022-04-17 DIAGNOSIS — I4891 Unspecified atrial fibrillation: Secondary | ICD-10-CM | POA: Diagnosis not present

## 2022-04-17 LAB — POCT INR: INR: 1.7 — AB (ref 2.0–3.0)

## 2022-04-17 NOTE — Patient Instructions (Signed)
Restarted warfarin 04/12/22 Take warfarin 1 1/2 tablet tonight then resume 1 tablet daily except 1 1/2 tablets on Saturdays Recheck INR in 2 wk Call us with any new medications, changes or any bleeding concerns @ [phone].

## 2022-04-17 NOTE — Progress Notes (Signed)
Cardiology Office Note  Date: 04/17/2022   ID: [name], [name] 1941-05-09, MRN [id number]  PCP:  Redmond School, MD  Cardiologist:  Rozann Lesches, MD Electrophysiologist:  None   Chief Complaint  Patient presents with   Cardiac follow-up    History of Present Illness: [name] is an 81 y.o. male last seen in October 2022.  He is here for a follow-up visit. I reviewed the chart.  He was recently hospitalized after a mechanical fall.  Found to have left wrist fracture as well as small subdural hematoma and subarachnoid hemorrhage.  He was taken off Coumadin temporarily and otherwise did not require surgical intervention.  Follow-up head CT on May 15 showed stable small volume subdural and subarachnoid hemorrhage in the left frontal region.  He has repeat scan scheduled for tomorrow.  Has already had orthopedic follow-up as well.  He has been on Coumadin with follow-up in the anticoagulation clinic.  He reports a vague sense of palpitations at times, nothing prolonged.  I reviewed the remainder of his medications which are outlined below.  Past Medical History:  Diagnosis Date   Aortic atherosclerosis ([place])    Barrett's esophagus    Short-segment, diagnosed in 2007 by Dr. [name]   BPH (benign prostatic hyperplasia)    CAD (coronary artery disease)    a. cath on 07/21/2019 showing patent LM and LCx with 40-60% Proximal LAD and 30-40% Pro-RCA stenosis with normal LV function.   Essential hypertension    GERD (gastroesophageal reflux disease)    Hiatal hernia    History of kidney stones    PAF (paroxysmal atrial fibrillation) ([place])    S/P colonoscopy 2007   S/P endoscopy 08/2010    Salmon-colored epithelium coming up to 37 cm from the    Type 2 diabetes mellitus ([place])    Vertigo     Past Surgical History:  Procedure Laterality Date   APPENDECTOMY     BIOPSY  01/28/2020   Procedure: BIOPSY;  Surgeon: [name], MD;  [facility];  Service:  Endoscopy;;  esophagus   CHOLECYSTECTOMY     COLONOSCOPY  02/06/2006   Dr. [name]- single anal papilla, o/w normal rectum, normal colon   COLONOSCOPY N/A 09/13/2016   RMR: three 5-6 mm for adenomas removed.  Recommend one last surveillance colonoscopy in October 2020.   COLONOSCOPY WITH PROPOFOL N/A 01/28/2020   Procedure: COLONOSCOPY WITH PROPOFOL;  Surgeon: [name], MD;  [facility];  Service: Endoscopy;  Laterality: N/A;  10:45am   CYSTOSCOPY WITH RETROGRADE PYELOGRAM, URETEROSCOPY AND STENT PLACEMENT Right 08/18/2020   Procedure: CYSTOSCOPY WITH INCISION OF RIGHT URETEROCELE, URETEROSCOPY WITH HOLMIUM LASER, CYSTOLITHOLAPAXY OF 10 MM BLADDER STONE;  Surgeon: [name], MD;  [facility];  Service: Urology;  Laterality: Right;   ESOPHAGOGASTRODUODENOSCOPY  09/15/2010   Dr. [name]- Salmon-colored epithelium coming up to 37 cm from the    ESOPHAGOGASTRODUODENOSCOPY N/A 07/02/2013   Dr. [name]- Barretts on bx. hiatal hernia   ESOPHAGOGASTRODUODENOSCOPY N/A 09/13/2016   RMR: Barrett's esophagus without dysplasia. one last EGD in 3 years.    ESOPHAGOGASTRODUODENOSCOPY (EGD) WITH PROPOFOL N/A 01/28/2020   Procedure: ESOPHAGOGASTRODUODENOSCOPY (EGD) WITH PROPOFOL;  Surgeon: [name], MD;  [facility];  Service: Endoscopy;  Laterality: N/A;   INTRAVASCULAR PRESSURE WIRE/FFR STUDY N/A 07/21/2019   Procedure: INTRAVASCULAR PRESSURE WIRE/FFR STUDY;  Surgeon: [name], MD;  Location: [facility];  Service: Cardiovascular;  Laterality: N/A;  LEFT HEART CATH AND CORONARY ANGIOGRAPHY N/A 07/21/2019   Procedure: LEFT HEART CATH AND CORONARY ANGIOGRAPHY;  Surgeon: [name], MD;  Location: [facility];  Service: Cardiovascular;  Laterality: N/A;   POLYPECTOMY  01/28/2020   Procedure: POLYPECTOMY;  Surgeon: [name], MD;  [facility];  Service: Endoscopy;;  colon   ROTATOR CUFF REPAIR     YAG LASER APPLICATION Right 12/28/9240    Procedure: YAG LASER APPLICATION;  Surgeon: [name], MD;  [facility];  Service: Ophthalmology;  Laterality: Right;    Current Outpatient Medications  Medication Sig Dispense Refill   Accu-Chek FastClix Lancets MISC 1 application by Does not apply route 2 (two) times daily. 102 each 3   allopurinol (ZYLOPRIM) 100 MG tablet Take 100 mg by mouth daily.      ALPRAZolam (XANAX) 1 MG tablet Take 0.5 mg by mouth at bedtime as needed for anxiety or sleep.     Cholecalciferol (VITAMIN D3) 50 MCG (2000 UT) TABS Take 2,000 Units by mouth daily.     DULoxetine (CYMBALTA) 60 MG capsule Take 60 mg by mouth daily.     escitalopram (LEXAPRO) 10 MG tablet Take 10 mg by mouth daily.     fexofenadine (ALLEGRA) 180 MG tablet Take 180 mg by mouth daily.     finasteride (PROSCAR) 5 MG tablet Take 1 tablet (5 mg total) by mouth daily. 90 tablet 3   gabapentin (NEURONTIN) 300 MG capsule Take 300 mg by mouth 2 (two) times daily.      glipiZIDE (GLUCOTROL) 5 MG tablet Take 5 mg by mouth daily.     glucose blood test strip 1 each by Other route daily before breakfast. Use as instructed 100 each 2   HYDROcodone-acetaminophen (NORCO/VICODIN) 5-325 MG tablet Take 1 tablet by mouth 4 (four) times daily as needed for moderate pain.     metFORMIN (GLUCOPHAGE-XR) 500 MG 24 hr tablet Take 2 tablets (1,000 mg total) by mouth daily with breakfast. 180 tablet 0   pantoprazole (PROTONIX) 40 MG tablet TAKE ONE TABLET ('40MG'$  TOTAL) BY MOUTH TWO TIMES DAILY BEFORE A MEAL (Patient taking differently: Take 40 mg by mouth 2 (two) times daily.) 180 tablet 1   rosuvastatin (CRESTOR) 10 MG tablet Take 1 tablet (10 mg total) by mouth daily. 90 tablet 1   sildenafil (VIAGRA) 50 MG tablet Take 50 mg by mouth daily.     tadalafil (CIALIS) 5 MG tablet Take 1 tablet (5 mg total) by mouth daily as needed for erectile dysfunction. 30 tablet 5   tamsulosin (FLOMAX) 0.4 MG CAPS capsule Take 1 capsule (0.4 mg total) by mouth 2 (two) times  daily. 180 capsule 3   traMADol (ULTRAM) 50 MG tablet Take 1 tablet (50 mg total) by mouth every 6 (six) hours as needed for moderate pain (pain.). 12 tablet 0   warfarin (COUMADIN) 5 MG tablet TAKE ONE TABLET BY MOUTH DAILY EXCEPT TAKE ONE AND ONE-HALF TABLETS ON SATURDAY OR AS DIRECTED 100 tablet 3   No current facility-administered medications for this visit.   Allergies:  Patient has no known allergies.   ROS:  No syncope.  Physical Exam: VS:  BP 112/66   Pulse 64   Ht 6' (1.829 m)   Wt 214 lb 12.8 oz (97.4 kg)   SpO2 98%   BMI 29.13 kg/m , BMI Body mass index is 29.13 kg/m.  Wt Readings from Last 3 Encounters:  04/17/22 214 lb 12.8 oz (  97.4 kg)  04/09/22 210 lb (95.3 kg)  04/01/22 214 lb (97.1 kg)    General: Patient appears comfortable at rest. HEENT: Conjunctiva and lids normal. Neck: Supple, no elevated JVP or carotid bruits, no thyromegaly. Lungs: Clear to auscultation, nonlabored breathing at rest. Cardiac: Regular rate and rhythm, no S3, 1/6 systolic murmur. Extremities: No pitting edema.  ECG:  An ECG dated 09/11/2021 was personally reviewed today and demonstrated:  Sinus bradycardia with low voltage, R' in lead V1 and V2.  Recent Labwork: May 2021: Cholesterol 186, triglycerides 189, HDL 34, LDL 118 06/07/2021: ALT 21; AST 21 04/01/2022: BUN 18; Creatinine, Ser 0.99; Hemoglobin 12.9; Platelets 160; Potassium 3.9; Sodium 137  May 2023: Hemoglobin A1c 7.9%  Other Studies Reviewed Today:  Cardiac catheterization 07/21/2019: Widely patent left main 40 to 60% proximal LAD with mild calcification.  DFR on LAD stenosis 0.94. Widely patent circumflex Very tortuous proximal right coronary with 30 to 40% proximal narrowing.  No significant obstruction identified. Normal LV function.   Echocardiogram 04/19/2020:  1. Left ventricular ejection fraction, by estimation, is 65 to 70%. The  left ventricle has normal function. The left ventricle has no regional  wall motion  abnormalities. Left ventricular diastolic parameters were  normal.   2. Right ventricular systolic function is normal. The right ventricular  size is normal. There is normal pulmonary artery systolic pressure. The  estimated right ventricular systolic pressure is 68.3 mmHg.   3. The mitral valve is grossly normal. Trivial mitral valve  regurgitation.   4. The aortic valve is tricuspid. Aortic valve regurgitation is trivial.   5. Aortic dilatation noted. There is mild dilatation of the aortic root.   6. The inferior vena cava is normal in size with greater than 50%  respiratory variability, suggesting right atrial pressure of 3 mmHg.   Assessment and Plan:  1.  Paroxysmal atrial fibrillation with CHA2DS2-VASc score of 5.  He has been on Coumadin with follow-up in anticoagulation clinic.  Due for repeat PT/INR today with recent resumption of anticoagulation following fall as discussed above.  2.  Nonobstructive CAD with no active angina symptoms.  He is not on aspirin since currently on Coumadin.  Continue Crestor.  Medication Adjustments/Labs and Tests Ordered: Current medicines are reviewed at length with the patient today.  Concerns regarding medicines are outlined above.   Tests Ordered: No orders of the defined types were placed in this encounter.   Medication Changes: No orders of the defined types were placed in this encounter.   Disposition:  Follow up  6 months.  Signed, Satira Sark, MD, [facility] 04/17/2022 1:41 PM    [facility] [place], [place], [place] [address] Phone: [phone]; Fax: [phone]

## 2022-04-17 NOTE — Patient Instructions (Addendum)

## 2022-04-18 ENCOUNTER — Ambulatory Visit (HOSPITAL_COMMUNITY)
Admission: RE | Admit: 2022-04-18 | Discharge: 2022-04-18 | Disposition: A | Discharge status: Home / Self Care | Payer: Medicare Other | Source: Ambulatory Visit | Attending: Neurosurgery | Admitting: Neurosurgery

## 2022-04-18 DIAGNOSIS — S065XAA Traumatic subdural hemorrhage with loss of consciousness status unknown, initial encounter: Secondary | ICD-10-CM | POA: Diagnosis not present

## 2022-04-18 DIAGNOSIS — Z87828 Personal history of other (healed) physical injury and trauma: Secondary | ICD-10-CM | POA: Diagnosis not present

## 2022-04-18 DIAGNOSIS — J329 Chronic sinusitis, unspecified: Secondary | ICD-10-CM | POA: Diagnosis not present

## 2022-04-18 DIAGNOSIS — G319 Degenerative disease of nervous system, unspecified: Secondary | ICD-10-CM | POA: Diagnosis not present

## 2022-04-18 DIAGNOSIS — I6200 Nontraumatic subdural hemorrhage, unspecified: Secondary | ICD-10-CM | POA: Diagnosis not present

## 2022-04-19 ENCOUNTER — Other Ambulatory Visit: Payer: Self-pay | Admitting: Gastroenterology

## 2022-04-20 DIAGNOSIS — S065XAA Traumatic subdural hemorrhage with loss of consciousness status unknown, initial encounter: Secondary | ICD-10-CM | POA: Diagnosis not present

## 2022-04-24 ENCOUNTER — Other Ambulatory Visit: Payer: Self-pay | Admitting: Cardiology

## 2022-05-03 ENCOUNTER — Ambulatory Visit (INDEPENDENT_AMBULATORY_CARE_PROVIDER_SITE_OTHER): Payer: Medicare Other | Admitting: *Deleted

## 2022-05-03 DIAGNOSIS — Z5181 Encounter for therapeutic drug level monitoring: Secondary | ICD-10-CM

## 2022-05-03 DIAGNOSIS — I4891 Unspecified atrial fibrillation: Secondary | ICD-10-CM | POA: Diagnosis not present

## 2022-05-03 LAB — POCT INR: INR: 2.1 (ref 2.0–3.0)

## 2022-05-03 NOTE — Patient Instructions (Signed)
Continue warfarin 1 tablet daily except 1 1/2 tablets on Saturdays Recheck INR in 4 wk Call us with any new medications, changes or any bleeding concerns @ [phone].  

## 2022-05-08 ENCOUNTER — Ambulatory Visit (INDEPENDENT_AMBULATORY_CARE_PROVIDER_SITE_OTHER): Payer: Medicare Other | Admitting: Orthopedic Surgery

## 2022-05-08 ENCOUNTER — Encounter: Payer: Self-pay | Admitting: Orthopedic Surgery

## 2022-05-08 ENCOUNTER — Ambulatory Visit (INDEPENDENT_AMBULATORY_CARE_PROVIDER_SITE_OTHER): Payer: Medicare Other

## 2022-05-08 DIAGNOSIS — S62112D Displaced fracture of triquetrum [cuneiform] bone, left wrist, subsequent encounter for fracture with routine healing: Secondary | ICD-10-CM

## 2022-05-08 NOTE — Patient Instructions (Signed)
Hand Exercises  Hand exercises can be helpful for almost anyone. These exercises can strengthen the hands, improve flexibility and movement, and increase blood flow to the hands. These results can make work and daily tasks easier. Hand exercises can be especially helpful for people who have joint pain from arthritis or have nerve damage from overuse (carpal tunnel syndrome). These exercises can also help people who have injured a hand.  Exercises Most of these hand exercises are gentle stretching and motion exercises. It is usually safe to do them often throughout the day. Warming up your hands before exercise may help to reduce stiffness. You can do this with gentle massage or by placing your hands in warm water for 10-15 minutes. It is normal to feel some stretching, pulling, tightness, or mild discomfort as you begin new exercises. This will gradually improve. Stop an exercise right away if you feel sudden, severe pain or your pain gets worse. Ask your health care provider which exercises are best for you. Knuckle bend or "claw" fist Stand or sit with your arm, hand, and all five fingers pointed straight up. Make sure to keep your wrist straight during the exercise. Gently bend your fingers down toward your palm until the tips of your fingers are touching the top of your palm. Keep your big knuckle straight and just bend the small knuckles in your fingers. Hold this position for 10 seconds. Straighten (extend) your fingers back to the starting position. Repeat this exercise 5-10 times with each hand. Full finger fist Stand or sit with your arm, hand, and all five fingers pointed straight up. Make sure to keep your wrist straight during the exercise. Gently bend your fingers into your palm until the tips of your fingers are touching the middle of your palm. Hold this position for 10 seconds. Extend your fingers back to the starting position, stretching every joint fully. Repeat this exercise  5-10 times with each hand. Straight fist Stand or sit with your arm, hand, and all five fingers pointed straight up. Make sure to keep your wrist straight during the exercise. Gently bend your fingers at the big knuckle, where your fingers meet your hand, and the middle knuckle. Keep the knuckle at the tips of your fingers straight and try to touch the bottom of your palm. Hold this position for 10 seconds. Extend your fingers back to the starting position, stretching every joint fully. Repeat this exercise 5-10 times with each hand. Tabletop Stand or sit with your arm, hand, and all five fingers pointed straight up. Make sure to keep your wrist straight during the exercise. Gently bend your fingers at the big knuckle, where your fingers meet your hand, as far down as you can while keeping the small knuckles in your fingers straight. Think of forming a tabletop with your fingers. Hold this position for 10 seconds. Extend your fingers back to the starting position, stretching every joint fully. Repeat this exercise 5-10 times with each hand. Finger spread Place your hand flat on a table with your palm facing down. Make sure your wrist stays straight as you do this exercise. Spread your fingers and thumb apart from each other as far as you can until you feel a gentle stretch. Hold this position for 10 seconds. Bring your fingers and thumb tight together again. Hold this position for 10 seconds. Repeat this exercise 5-10 times with each hand. Making circles Stand or sit with your arm, hand, and all five fingers pointed straight up. Make  sure to keep your wrist straight during the exercise. Make a circle by touching the tip of your thumb to the tip of your index finger. Hold for 10 seconds. Then open your hand wide. Repeat this motion with your thumb and each finger on your hand. Repeat this exercise 5-10 times with each hand. Thumb motion Sit with your forearm resting on a table and your wrist  straight. Your thumb should be facing up toward the ceiling. Keep your fingers relaxed as you move your thumb. Lift your thumb up as high as you can toward the ceiling. Hold for 10 seconds. Bend your thumb across your palm as far as you can, reaching the tip of your thumb for the small finger (pinkie) side of your palm. Hold for 10 seconds. Repeat this exercise 5-10 times with each hand. Grip strengthening Hold a stress ball or other soft ball in the middle of your hand. Slowly increase the pressure, squeezing the ball as much as you can without causing pain. Think of bringing the tips of your fingers into the middle of your palm. All of your finger joints should bend when doing this exercise. Hold your squeeze for 10 seconds, then relax. Repeat this exercise 5-10 times with each hand.   Contact a health care provider if: Your hand pain or discomfort gets much worse when you do an exercise. Your hand pain or discomfort does not improve within 2 hours after you exercise. If you have any of these problems, stop doing these exercises right away. Do not do them again unless your health care provider says that you can.    Get help right away if: You develop sudden, severe hand pain or swelling. If this happens, stop doing these exercises right away. Do not do them again unless your health care provider says that you can. This information is not intended to replace advice given to you by your health care provider. Make sure you discuss any questions you have with your health care provider.  

## 2022-05-09 ENCOUNTER — Encounter: Payer: Self-pay | Admitting: Orthopedic Surgery

## 2022-05-09 NOTE — Progress Notes (Signed)
Return patient Visit  Assessment: [name] is a 81 y.o. male with the following: 1. Closed chip fracture of triquetrum of left wrist  Plan: [name] sustained a minimally displaced fracture, dorsal aspect of the triquetrum.  Repeat radiographs today do not demonstrate an obvious injury.  He does continue to have some pain, particularly within the palm of his hand.  Urged him to gradually get out of the removable wrist brace.  His pain should continue to improve.  He has developed some stiffness, which needs to be addressed.  Simple exercises were discussed.  If he is not getting better, we can consider a hand therapy referral.  Follow-up: Return if symptoms worsen or fail to improve.  Subjective:  Chief Complaint  Patient presents with   Wrist Injury    Left wrist improving but still has some pain 04/01/22    History of Present Illness: [name] is a 81 y.o. male who returns for evaluation of left hand pain.  He sustained an injury to his left hand approximately 5-6 weeks ago.  He was noted to have a avulsion fracture off of the dorsal aspect of the triquetrum.  He has been using a removable wrist brace.  His pain has improved overall.  He does note some pain with certain motions.  He says he is shooting pains within the ulnar wrist, as well as within his palm.  No additional injuries.  Review of Systems: No fevers or chills + numbness and tingling No chest pain No shortness of breath No bowel or bladder dysfunction No GI distress No headaches    Objective: There were no vitals taken for this visit.  Physical Exam:  General: Elderly male., Alert and oriented., and No acute distress. Gait: Normal gait.  Left hand without swelling.  No bruising is appreciated.  Tenderness to palpation over the ulnar styloid.  He has some stiffness within the left wrist.  Unable to make a full fist.  Fingers are warm and well-perfused.  IMAGING: I personally reviewed images  previously obtained from the ED  X-ray of the left wrist was obtained in clinic today.  No obvious injury.  Avulsion fracture off the dorsal aspect of the triquetrum is not well visualized.  No injury to the ulnar styloid.  No dislocations.  Left wrist otherwise.  Impression: Negative left wrist x-ray.   New Medications:  No orders of the defined types were placed in this encounter.     Mordecai Rasmussen, MD  05/09/2022 10:18 AM

## 2022-05-24 ENCOUNTER — Encounter: Payer: Self-pay | Admitting: Internal Medicine

## 2022-05-25 DIAGNOSIS — R5383 Other fatigue: Secondary | ICD-10-CM | POA: Diagnosis not present

## 2022-05-25 DIAGNOSIS — E559 Vitamin D deficiency, unspecified: Secondary | ICD-10-CM | POA: Diagnosis not present

## 2022-05-25 DIAGNOSIS — E1165 Type 2 diabetes mellitus with hyperglycemia: Secondary | ICD-10-CM | POA: Diagnosis not present

## 2022-05-26 LAB — COMPREHENSIVE METABOLIC PANEL
ALT: 15 IU/L (ref 0–44)
AST: 17 IU/L (ref 0–40)
Albumin/Globulin Ratio: 1.8 (ref 1.2–2.2)
Albumin: 3.8 g/dL (ref 3.7–4.7)
Alkaline Phosphatase: 69 IU/L (ref 44–121)
BUN/Creatinine Ratio: 14 (ref 10–24)
BUN: 15 mg/dL (ref 8–27)
Bilirubin Total: 0.6 mg/dL (ref 0.0–1.2)
CO2: 22 mmol/L (ref 20–29)
Calcium: 9 mg/dL (ref 8.6–10.2)
Chloride: 103 mmol/L (ref 96–106)
Creatinine, Ser: 1.05 mg/dL (ref 0.76–1.27)
Globulin, Total: 2.1 g/dL (ref 1.5–4.5)
Glucose: 298 mg/dL — ABNORMAL HIGH (ref 70–99)
Potassium: 4 mmol/L (ref 3.5–5.2)
Sodium: 139 mmol/L (ref 134–144)
Total Protein: 5.9 g/dL — ABNORMAL LOW (ref 6.0–8.5)
eGFR: 72 mL/min/{1.73_m2} (ref >59)

## 2022-05-26 LAB — VITAMIN D 25 HYDROXY (VIT D DEFICIENCY, FRACTURES): Vit D, 25-Hydroxy: 27 ng/mL — ABNORMAL LOW (ref 30.0–100.0)

## 2022-05-26 LAB — TSH: TSH: 2.42 u[IU]/mL (ref 0.450–4.500)

## 2022-05-26 LAB — T4, FREE: Free T4: 1.1 ng/dL (ref 0.82–1.77)

## 2022-05-29 ENCOUNTER — Ambulatory Visit (INDEPENDENT_AMBULATORY_CARE_PROVIDER_SITE_OTHER): Payer: Medicare Other | Admitting: Orthopedic Surgery

## 2022-05-29 ENCOUNTER — Ambulatory Visit (HOSPITAL_COMMUNITY): Payer: Medicare Other | Attending: Orthopedic Surgery

## 2022-05-29 ENCOUNTER — Encounter: Payer: Self-pay | Admitting: Orthopedic Surgery

## 2022-05-29 VITALS — BP 113/60 | HR 54 | Ht 72.0 in | Wt 213.0 lb

## 2022-05-29 DIAGNOSIS — R278 Other lack of coordination: Secondary | ICD-10-CM | POA: Insufficient documentation

## 2022-05-29 DIAGNOSIS — M25632 Stiffness of left wrist, not elsewhere classified: Secondary | ICD-10-CM | POA: Insufficient documentation

## 2022-05-29 DIAGNOSIS — R29898 Other symptoms and signs involving the musculoskeletal system: Secondary | ICD-10-CM | POA: Diagnosis not present

## 2022-05-29 DIAGNOSIS — M79642 Pain in left hand: Secondary | ICD-10-CM | POA: Insufficient documentation

## 2022-05-29 DIAGNOSIS — S62112D Displaced fracture of triquetrum [cuneiform] bone, left wrist, subsequent encounter for fracture with routine healing: Secondary | ICD-10-CM

## 2022-05-29 NOTE — Progress Notes (Signed)
Return patient Visit  Assessment: [name] is a 81 y.o. male with the following: 1. Closed chip fracture of triquetrum of left wrist; continued stiffness of his wrist and hand  Plan: [name] continues to have stiffness, and pain in the left wrist.  No recent injuries.  On physical exam, he does have some stiffness within the wrist, and his fingers.  He is not taking medicines.  He is using topicals.  I recommended Voltaren gel.  Given his struggles to improve his range of motion, I recommended a referral to hand therapy.  He is in agreement with this plan.  Follow-up as needed.  Follow-up: Return if symptoms worsen or fail to improve.  Subjective:  Chief Complaint  Patient presents with   Wrist Pain    Wrist/back of hand pain. Feels like the exercises he's doing isn't helping and may be causing more pain.     History of Present Illness: [name] is a 81 y.o. male who returns for evaluation of left hand pain.  He sustained an injury to his left hand approximately 2 months ago.  Repeat x-rays are without injury at the last visit.  This likely represents an avulsion injury on the posterior aspect of the left wrist.  He has been doing exercises at home.  He notes continued stiffness and pain.  He has been frustrated with his limited improvement thus far.  He is using Biofreeze.  No medicines otherwise.  He is no longer using his brace.   Review of Systems: No fevers or chills + numbness and tingling No chest pain No shortness of breath No bowel or bladder dysfunction No GI distress No headaches    Objective: BP 113/60   Pulse (!) 54   Ht 6' (1.829 m)   Wt 213 lb (96.6 kg)   BMI 28.89 kg/m   Physical Exam:  General: Elderly male., Alert and oriented., and No acute distress. Gait: Normal gait.  Evaluation left hand demonstrates no swelling.  No bruising is appreciated.  He does note some pain with flexion and extension at the wrist.  He also has pain with  supination.  No point tenderness.  He is unable to make a full fist.  Fingers are warm and well-perfused.  IMAGING: I personally reviewed images previously obtained from the ED  No new imaging obtained today.  New Medications:  No orders of the defined types were placed in this encounter.     Mordecai Rasmussen, MD  05/29/2022 9:19 AM

## 2022-05-29 NOTE — Patient Instructions (Signed)
Voltaren gel    Refer to hand therapy.

## 2022-05-29 NOTE — Patient Instructions (Signed)
Tendon Gliding Exercises: Complete 10X, hold for 3-5 seconds, 1-2x per day  1) Straight: begin with wrist in extended position and fingers straight    2) Hook: Bend your fingers making them look like a hook while keeping your thumb straight.      3) Fist: Make your hand into a fist.      4) Straight Fist: Bend your fingers straight down into a straight fist.      5) Table Top: Straighten your fingers straight out making them look like a table top.        AROM Exercises   1) Wrist Flexion  Start with wrist at edge of table, palm facing up. With wrist hanging slightly off table, curl wrist upward, and back down.      2) Wrist Extension  Start with wrist at edge of table, palm facing down. With wrist slightly off the edge of the table, curl wrist up and back down.      3) Radial Deviations  Start with forearm flat against a table, wrist hanging slightly off the edge, and palm facing the wall. Bending at the wrist only, and keeping palm facing the wall, bend wrist so fist is pointing towards the floor, back up to start position, and up towards the ceiling. Return to start.        4) WRIST PRONATION  Turn your forearm towards palm face down.  Keep your elbow bent and by the side of your  Body.      5) WRIST SUPINATION  Turn your forearm towards palm face up.  Keep your elbow bent and by the side of your  Body.      *Complete exercises ______ times each, _______ times per day*    *Complete exercises using ____ pound weight, ____times each, ____times per day*

## 2022-05-29 NOTE — Therapy (Signed)
OUTPATIENT OCCUPATIONAL THERAPY ORTHO EVALUATION  Patient Name: [name] MRN: [id number] DOB:September 17, 1941, 81 y.o., male Today's Date: 05/29/2022  PCP: Redmond School, MD REFERRING PROVIDER: Larena Glassman, MD   OT End of Session - 05/29/22 1227     Visit Number 1    Number of Visits 12    Date for OT Re-Evaluation 07/13/22    Authorization Type 2)Medicare Part A and B   2)BCBS Supplement    OT Start Time 1033    OT Stop Time 1115    OT Time Calculation (min) 42 min    Activity Tolerance Patient tolerated treatment well    Behavior During Therapy [facility] for tasks assessed/performed             Past Medical History:  Diagnosis Date   Aortic atherosclerosis ([place])    Barrett's esophagus    Short-segment, diagnosed in 2007 by Dr. [name]   BPH (benign prostatic hyperplasia)    CAD (coronary artery disease)    a. cath on 07/21/2019 showing patent LM and LCx with 40-60% Proximal LAD and 30-40% Pro-RCA stenosis with normal LV function.   Essential hypertension    GERD (gastroesophageal reflux disease)    Hiatal hernia    History of kidney stones    PAF (paroxysmal atrial fibrillation) ([place])    S/P colonoscopy 2007   S/P endoscopy 08/2010    Salmon-colored epithelium coming up to 37 cm from the    Type 2 diabetes mellitus ([place])    Vertigo    Past Surgical History:  Procedure Laterality Date   APPENDECTOMY     BIOPSY  01/28/2020   Procedure: BIOPSY;  Surgeon: [name], MD;  Location: [facility];  Service: Endoscopy;;  esophagus   CHOLECYSTECTOMY     COLONOSCOPY  02/06/2006   Dr. [name]- single anal papilla, o/w normal rectum, normal colon   COLONOSCOPY N/A 09/13/2016   RMR: three 5-6 mm for adenomas removed.  Recommend one last surveillance colonoscopy in October 2020.   COLONOSCOPY WITH PROPOFOL N/A 01/28/2020   Procedure: COLONOSCOPY WITH PROPOFOL;  Surgeon: [name], MD;  Location: [facility];  Service: Endoscopy;  Laterality: N/A;  10:45am   CYSTOSCOPY  WITH RETROGRADE PYELOGRAM, URETEROSCOPY AND STENT PLACEMENT Right 08/18/2020   Procedure: CYSTOSCOPY WITH INCISION OF RIGHT URETEROCELE, URETEROSCOPY WITH HOLMIUM LASER, CYSTOLITHOLAPAXY OF 10 MM BLADDER STONE;  Surgeon: [name], MD;  Location: [facility];  Service: Urology;  Laterality: Right;   ESOPHAGOGASTRODUODENOSCOPY  09/15/2010   Dr. [name]- Salmon-colored epithelium coming up to 37 cm from the    ESOPHAGOGASTRODUODENOSCOPY N/A 07/02/2013   Dr. [name]- Barretts on bx. hiatal hernia   ESOPHAGOGASTRODUODENOSCOPY N/A 09/13/2016   RMR: Barrett's esophagus without dysplasia. one last EGD in 3 years.    ESOPHAGOGASTRODUODENOSCOPY (EGD) WITH PROPOFOL N/A 01/28/2020   Procedure: ESOPHAGOGASTRODUODENOSCOPY (EGD) WITH PROPOFOL;  Surgeon: [name], MD;  [facility];  Service: Endoscopy;  Laterality: N/A;   INTRAVASCULAR PRESSURE WIRE/FFR STUDY N/A 07/21/2019   Procedure: INTRAVASCULAR PRESSURE WIRE/FFR STUDY;  Surgeon: [name], MD;  Location: [facility];  Service: Cardiovascular;  Laterality: N/A;   LEFT HEART CATH AND CORONARY ANGIOGRAPHY N/A 07/21/2019   Procedure: LEFT HEART CATH AND CORONARY ANGIOGRAPHY;  Surgeon: [name], MD;  Location: [place] [facility];  Service: Cardiovascular;  Laterality: N/A;   POLYPECTOMY  01/28/2020   Procedure: POLYPECTOMY;  Surgeon: [name], MD;  [facility];  Service: Endoscopy;;  colon   ROTATOR CUFF REPAIR     YAG LASER APPLICATION Right 01/22/4655   Procedure: YAG LASER APPLICATION;  Surgeon: [name], MD;  Location: [facility];  Service: Ophthalmology;  Laterality: Right;   Patient Active Problem List   Diagnosis Date Noted   Subdural hematoma ([place]) 04/01/2022   DOE (dyspnea on exertion) 06/28/2020   Long-term current use of opiate analgesic 02/10/2020   Arthritis of right sacroiliac joint 02/08/2020   Lumbar radiculopathy 02/08/2020   Polyneuropathy due to type 2 diabetes mellitus ([place]) 02/08/2020    Restless legs 02/08/2020   Chronic low back pain 02/08/2020   Polyarthropathy 02/08/2020   Fatigue 10/19/2019   H/O adenomatous polyp of colon 10/19/2019   Abnormal cardiac CT angiography    Encounter for therapeutic drug monitoring 07/02/2018   Atrial fibrillation ([place]) 06/24/2018   DIZZINESS 05/09/2010   Angina pectoris ([place]) 05/09/2010   GERD 01/31/2009   BARRETTS ESOPHAGUS 01/31/2009    ONSET DATE: 04/02/22  REFERRING DIAG: C12.751Z (ICD-10-CM) - Closed chip fracture of left triquetrum with routine healing, subsequent encounter  THERAPY DIAG:  Pain in left hand  Stiffness of left wrist, not elsewhere classified  Other lack of coordination  Other symptoms and signs involving the musculoskeletal system  Rationale for Evaluation and Treatment Rehabilitation  SUBJECTIVE:   SUBJECTIVE STATEMENT: S: I am frustrated because it should be better by now. And it did start to get better for a little but now it is worse. I can't grip anything, I feel like I've lost all of my strength.  Pt accompanied by: self  PERTINENT HISTORY: Pt reports a fall on 04/02/22 which resulted in a closed chip fracture of left triquetrum. He was instructed to wear a removable wrist brace for 5 weeks and encouraged to complete A/ROM exercises. He continued to have pain and stiffness, returned to specialist with a negative left wrist x-ray. Dr. Amedeo Kinsman referred pt for an OT evaluation and treatment to address limited ROM and increased pain.   PRECAUTIONS: None  WEIGHT BEARING RESTRICTIONS No  PAIN:  Are you having pain? Yes: NPRS scale: 5/10 Pain location: dorsal hand and along the ulnar aspect of the forearm  Pain description: sharp Aggravating factors: movement  Relieving factors: Ice   0/01 with certain movements (pronation)    FALLS: Has patient fallen in last 6 months? Yes. Number of falls 2; 1 fall due to neuropathy, 1 resulting in current injury   LIVING ENVIRONMENT: Lives with: lives  alone Lives in: House/apartment   PLOF: Independent  PATIENT GOALS I would like to be able to play in my golf tournament in August  OBJECTIVE:   HAND DOMINANCE: Right  ADLs: Overall ADLs: Pt is independent in ADLs although has had to make some adaptations as certain activities cause increased pain.  He reports that he is unable to swing a golf club and is unable to pick up his 4-year-old grandson.      FUNCTIONAL OUTCOME MEASURES: Quick Dash: 43.18  UPPER EXTREMITY ROM     Active ROM Left eval  Wrist flexion 45  Wrist extension 28  Wrist ulnar deviation 12  Wrist radial deviation 8  Wrist pronation 90  Wrist supination 81  (Blank rows = not tested)  Active ROM Left eval  Index MCP (0-90) 72   Index PIP (0-100)  50  Index DIP (0-70)  52  Long MCP (0-90)  78  Long PIP (0-100)  71  Long DIP (0-70)  53  Ring  MCP (0-90)  72  Ring PIP (0-100)  71  Ring DIP (0-70)  51  Little MCP (0-90)  70  Little PIP (0-100)  62  Little DIP (0-70)  50  (Blank rows = not tested)   UPPER EXTREMITY MMT:     MMT Right eval Left eval  Elbow flexion    Elbow extension    Wrist flexion    Wrist extension    Wrist ulnar deviation    Wrist radial deviation    Wrist pronation    Wrist supination    (Blank rows = not tested)  HAND FUNCTION: Grip strength: Right: 72 lbs; Left: 21 lbs, Lateral pinch: Right: 18 lbs, Left: 11 lbs, and 3 point pinch: Right: 14 lbs, Left: 6 lbs  COORDINATION: 9 Hole Peg test: Right: 38 sec; Left: 32 sec  SENSATION: Pt reports decreased sensation in tips of fingers, especially D4/5  EDEMA: Mild swelling throughout LUE  OBSERVATIONS: mild fascial restrictions throughout LUE  COGNITION: Overall cognitive status: Within functional limits for tasks assessed   TODAY'S TREATMENT:  Evaluation    PATIENT EDUCATION: Education details: Tendon glides, Wrist A/ROM Person educated: Patient Education method: Consulting civil engineer, Demonstration, and  Handouts Education comprehension: verbalized understanding and returned demonstration   HOME EXERCISE PROGRAM: Eval: Tendon glides, Wrist A/ROM  GOALS: Goals reviewed with patient? Yes  SHORT TERM GOALS: Target date: 07/13/22  Pt will be provided with and educated on HEP to improve mobility in LUE required for ADL completion.  Goal status: INITIAL  2.  Pt will decrease pain in LUE to 2/10 or less in order to swing a golf club with less pain.  Goal status: INITIAL  3.  Pt will increase ROM in left digits to improve ability to form a full grasp required for holding items.  Goal status: INITIAL  4.  Pt will increase left grip strength by 15# and pinch strength by 4# to improve ability to grasp and hold pots and pans during meal preparation.  Goal status: INITIAL  5.  Pt will decrease LUE fascial restrictions to trace amounts or less to improve mobility required for fine motor tasks such as opening containers and food packages.  Goal status: INITIAL     ASSESSMENT:  CLINICAL IMPRESSION: Patient is a 81 y.o. male who was seen today for occupational therapy evaluation for left wrist pain and stiffness following a fall that resulted in a left trapezium fracture on 04/01/22. He presents today with complaints of left hand pain with movement, stiffness, weakness, and decreased coordination and sensation impacting his ability to complete ADLs and IADLs at [facility] and without limitations due to pain.   PERFORMANCE DEFICITS in functional skills including ADLs, IADLs, coordination, dexterity, sensation, ROM, strength, pain, fascial restrictions, and UE functional use.  IMPAIRMENTS are limiting patient from ADLs, IADLs, and leisure.   COMORBIDITIES has no other co-morbidities that affects occupational performance. Patient will benefit from skilled OT to address above impairments and improve overall function.  MODIFICATION OR ASSISTANCE TO COMPLETE EVALUATION: No modification of tasks or assist  necessary to complete an evaluation.  OT OCCUPATIONAL PROFILE AND HISTORY: Problem focused assessment: Including review of records relating to presenting problem.  CLINICAL DECISION MAKING: LOW - limited treatment options, no task modification necessary  REHAB POTENTIAL: Good  EVALUATION COMPLEXITY: Low      PLAN: OT FREQUENCY: 2x/week  OT DURATION: 6 weeks  PLANNED INTERVENTIONS: self care/ADL training, therapeutic exercise, therapeutic activity, manual therapy, passive range of motion, electrical stimulation, ultrasound,  paraffin, moist heat, cryotherapy, energy conservation, and DME and/or AE instructions   CONSULTED AND AGREED WITH PLAN OF CARE: Patient  PLAN FOR NEXT SESSION: LUE MMT, P/ROM at wrist and digits, manual therapy, strengthening  Mathews Robinsons, OTR/L [phone]  05/29/2022, 1:44 PM

## 2022-05-30 ENCOUNTER — Encounter: Payer: Self-pay | Admitting: Nurse Practitioner

## 2022-05-30 ENCOUNTER — Ambulatory Visit (INDEPENDENT_AMBULATORY_CARE_PROVIDER_SITE_OTHER): Payer: Medicare Other | Admitting: Nurse Practitioner

## 2022-05-30 VITALS — BP 109/72 | HR 66 | Ht 72.0 in | Wt 214.0 lb

## 2022-05-30 DIAGNOSIS — E1165 Type 2 diabetes mellitus with hyperglycemia: Secondary | ICD-10-CM

## 2022-05-30 DIAGNOSIS — E559 Vitamin D deficiency, unspecified: Secondary | ICD-10-CM

## 2022-05-30 DIAGNOSIS — R5383 Other fatigue: Secondary | ICD-10-CM

## 2022-05-30 NOTE — Progress Notes (Signed)
Endocrinology Follow Up Visit      05/30/2022, 3:02 PM      Subjective:    Patient ID: [name], male    DOB: Aug 14, 1941.  [name] is being seen in follow up after being seen in consultation for management of currently uncontrolled symptomatic diabetes requested by  Redmond School, MD.  He is a retired Engineer, structural for [employer].   Past Medical History:  Diagnosis Date   Aortic atherosclerosis ([place])    Barrett's esophagus    Short-segment, diagnosed in 2007 by Dr. [name]   BPH (benign prostatic hyperplasia)    CAD (coronary artery disease)    a. cath on 07/21/2019 showing patent LM and LCx with 40-60% Proximal LAD and 30-40% Pro-RCA stenosis with normal LV function.   Essential hypertension    GERD (gastroesophageal reflux disease)    Hiatal hernia    History of kidney stones    PAF (paroxysmal atrial fibrillation) ([place])    S/P colonoscopy 2007   S/P endoscopy 08/2010    Salmon-colored epithelium coming up to 37 cm from the    Type 2 diabetes mellitus ([place])    Vertigo     Past Surgical History:  Procedure Laterality Date   APPENDECTOMY     BIOPSY  01/28/2020   Procedure: BIOPSY;  Surgeon: [name], MD;  [facility];  Service: Endoscopy;;  esophagus   CHOLECYSTECTOMY     COLONOSCOPY  02/06/2006   Dr. [name]- single anal papilla, o/w normal rectum, normal colon   COLONOSCOPY N/A 09/13/2016   RMR: three 5-6 mm for adenomas removed.  Recommend one last surveillance colonoscopy in October 2020.   COLONOSCOPY WITH PROPOFOL N/A 01/28/2020   Procedure: COLONOSCOPY WITH PROPOFOL;  Surgeon: [name], MD;  [facility]: [facility];  Service: Endoscopy;  Laterality: N/A;  10:45am   CYSTOSCOPY WITH RETROGRADE PYELOGRAM, URETEROSCOPY AND STENT PLACEMENT Right 08/18/2020   Procedure: CYSTOSCOPY WITH INCISION OF RIGHT URETEROCELE, URETEROSCOPY WITH HOLMIUM  LASER, CYSTOLITHOLAPAXY OF 10 MM BLADDER STONE;  Surgeon: [name], MD;  Location: [facility];  Service: Urology;  Laterality: Right;   ESOPHAGOGASTRODUODENOSCOPY  09/15/2010   Dr. [name]- Salmon-colored epithelium coming up to 37 cm from the    ESOPHAGOGASTRODUODENOSCOPY N/A 07/02/2013   Dr. [name]- Barretts on bx. hiatal hernia   ESOPHAGOGASTRODUODENOSCOPY N/A 09/13/2016   RMR: Barrett's esophagus without dysplasia. one last EGD in 3 years.    ESOPHAGOGASTRODUODENOSCOPY (EGD) WITH PROPOFOL N/A 01/28/2020   Procedure: ESOPHAGOGASTRODUODENOSCOPY (EGD) WITH PROPOFOL;  Surgeon: [name], MD;  [facility];  Service: Endoscopy;  Laterality: N/A;   INTRAVASCULAR PRESSURE WIRE/FFR STUDY N/A 07/21/2019   Procedure: INTRAVASCULAR PRESSURE WIRE/FFR STUDY;  Surgeon: [name], MD;  Location: [facility];  Service: Cardiovascular;  Laterality: N/A;   LEFT HEART CATH AND CORONARY ANGIOGRAPHY N/A 07/21/2019   Procedure: LEFT HEART CATH AND CORONARY ANGIOGRAPHY;  Surgeon: [name], MD;  Location: [facility];  Service: Cardiovascular;  Laterality: N/A;   POLYPECTOMY  01/28/2020   Procedure: POLYPECTOMY;  Surgeon: [name], MD;  [facility];  Service: Endoscopy;;  colon   ROTATOR CUFF REPAIR     YAG LASER APPLICATION Right 0/01/5464   Procedure: YAG LASER APPLICATION;  Surgeon: [name], MD;  Location: [facility];  Service: Ophthalmology;  Laterality: Right;    Social History   Socioeconomic History   Marital status: Married    Spouse name: Not on file   Number of children: Not on file   Years of education: Not on file   Highest education level: Not on file  Occupational History   Not on file  Tobacco Use   Smoking status: Former    Packs/day: 1.50    Years: 30.00    Total pack years: 45.00    Types: Cigarettes    Quit date: 11/18/1993    Years since quitting: 28.5   Smokeless tobacco: Never   Tobacco comments:    quit in the 90's   Vaping Use   Vaping Use: Never used  Substance and Sexual Activity   Alcohol use: No   Drug use: No   Sexual activity: Not on file  Other Topics Concern   Not on file  Social History Narrative   Not on file   Social Determinants of Health   Financial Resource Strain: Not on file  Food Insecurity: Not on file  Transportation Needs: Not on file  Physical Activity: Not on file  Stress: Not on file  Social Connections: Not on file    Family History  Problem Relation Age of Onset   Prostate cancer Father        deceased   Stroke Mother        deceased   Colon cancer Neg Hx     Outpatient Encounter Medications as of 05/30/2022  Medication Sig   Accu-Chek FastClix Lancets MISC 1 application by Does not apply route 2 (two) times daily.   allopurinol (ZYLOPRIM) 100 MG tablet Take 100 mg by mouth daily.    ALPRAZolam (XANAX) 1 MG tablet Take 0.5 mg by mouth at bedtime as needed for anxiety or sleep.   Cholecalciferol (VITAMIN D3) 50 MCG (2000 UT) TABS Take 2,000 Units by mouth daily.   DULoxetine (CYMBALTA) 60 MG capsule Take 60 mg by mouth daily.   escitalopram (LEXAPRO) 10 MG tablet Take 10 mg by mouth daily.   fexofenadine (ALLEGRA) 180 MG tablet Take 180 mg by mouth daily.   finasteride (PROSCAR) 5 MG tablet Take 1 tablet (5 mg total) by mouth daily.   gabapentin (NEURONTIN) 300 MG capsule Take 300 mg by mouth 2 (two) times daily.    glipiZIDE (GLUCOTROL) 5 MG tablet Take 5 mg by mouth daily.   glucose blood test strip 1 each by Other route daily before breakfast. Use as instructed   HYDROcodone-acetaminophen (NORCO/VICODIN) 5-325 MG tablet Take 1 tablet by mouth 4 (four) times daily as needed for moderate pain.   metFORMIN (GLUCOPHAGE-XR) 500 MG 24 hr tablet Take 2 tablets (1,000 mg total) by mouth daily with breakfast.   pantoprazole (PROTONIX) 40 MG tablet TAKE ONE TABLET ('40MG'$  TOTAL) BY MOUTH TWO TIMES DAILY BEFORE A MEAL   rosuvastatin (CRESTOR) 10 MG tablet TAKE ONE  TABLET ('10MG'$  TOTAL) BY MOUTH DAILY   sildenafil (VIAGRA) 50 MG tablet Take 50 mg by mouth daily.   tadalafil (CIALIS) 5 MG tablet Take 1 tablet (5 mg total) by mouth daily as needed for erectile dysfunction.   tamsulosin (FLOMAX) 0.4 MG CAPS capsule Take 1 capsule (0.4 mg total) by  mouth 2 (two) times daily.   traMADol (ULTRAM) 50 MG tablet Take 1 tablet (50 mg total) by mouth every 6 (six) hours as needed for moderate pain (pain.).   warfarin (COUMADIN) 5 MG tablet TAKE ONE TABLET BY MOUTH DAILY EXCEPT TAKE ONE AND ONE-HALF TABLETS ON SATURDAY OR AS DIRECTED   No facility-administered encounter medications on file as of 05/30/2022.    ALLERGIES: No Known Allergies  VACCINATION STATUS: Immunization History  Administered Date(s) Administered   Influenza-Unspecified 09/19/2013    Diabetes He presents for his follow-up diabetic visit. He has type 2 diabetes mellitus. Onset time: diagnosed at approximate age of 56. His disease course has been improving. There are no hypoglycemic associated symptoms. Pertinent negatives for hypoglycemia include no confusion, headaches, seizures or tremors. Associated symptoms include fatigue and foot paresthesias. Pertinent negatives for diabetes include no chest pain, no foot ulcerations, no polydipsia, no polyuria, no visual change and no weight loss. There are no hypoglycemic complications. Symptoms are stable. Diabetic complications include heart disease and peripheral neuropathy. Risk factors for coronary artery disease include diabetes mellitus, dyslipidemia, hypertension, male sex, obesity and sedentary lifestyle. Current diabetic treatment includes oral agent (dual therapy). He is compliant with treatment most of the time. His weight is fluctuating minimally. He is following a generally unhealthy diet. When asked about meal planning, he reported none. He has not had a previous visit with a dietitian. He rarely participates in exercise. His overall blood glucose  range is >200 mg/dl. (He presents today with his meter, no logs, showing inconsistent glucose monitoring.  He does not always monitor glucose at the same time each day.  His previsit A1c on 5/14 was 7.9%, improving from last visit of 8.3%.  He denies any s/s of hypoglycemia.  Analysis of his meter shows 7-day average of 217 (3 readings); 14-day average of 206 (6 readings); 28-day average of 218 (10 readings).  ) An ACE inhibitor/angiotensin II receptor blocker is not being taken. He does not see a podiatrist.Eye exam is current.    Review of systems  Constitutional: + Minimally fluctuating body weight,  current Body mass index is 29.02 kg/m. , ++ fatigue, no subjective hyperthermia, no subjective hypothermia Eyes: no blurry vision, no xerophthalmia ENT: no sore throat, no nodules palpated in throat, no dysphagia/odynophagia, no hoarseness Cardiovascular: no chest pain, no shortness of breath, no palpitations, no leg swelling Respiratory: no cough, no shortness of breath Gastrointestinal: no nausea/vomiting/diarrhea Musculoskeletal: no muscle/joint aches, reports LOB at times- had a fall on Mothers day breaking his left wrist (in splint) Skin: no rashes, no hyperemia Neurological: no tremors, + numbness/tingling to BLE, no dizziness Psychiatric: no depression, no anxiety   Objective:    BP 109/72   Pulse 66   Ht 6' (1.829 m)   Wt 214 lb (97.1 kg)   BMI 29.02 kg/m   Wt Readings from Last 3 Encounters:  05/30/22 214 lb (97.1 kg)  05/29/22 213 lb (96.6 kg)  04/17/22 214 lb 12.8 oz (97.4 kg)    BP Readings from Last 3 Encounters:  05/30/22 109/72  05/29/22 113/60  04/17/22 112/66     Physical Exam- Limited  Constitutional:  Body mass index is 29.02 kg/m. , not in acute distress, normal state of mind Eyes:  EOMI, no exophthalmos Neck: Supple Cardiovascular: RRR, no murmurs, rubs, or gallops, no edema Respiratory: Adequate breathing efforts, no crackles, rales, rhonchi, or  wheezing Musculoskeletal: no gross deformities, strength intact in all four extremities, no gross restriction of  joint movements, left wrist in splint from recent fracture s/p fall on Mothers day Skin:  no rashes, no hyperemia Neurological: no tremor with outstretched hands    CMP ( most recent) CMP     Component Value Date/Time   NA 139 05/25/2022 1316   K 4.0 05/25/2022 1316   CL 103 05/25/2022 1316   CO2 22 05/25/2022 1316   GLUCOSE 298 (H) 05/25/2022 1316   GLUCOSE 202 (H) 04/01/2022 2150   BUN 15 05/25/2022 1316   CREATININE 1.05 05/25/2022 1316   CREATININE 1.16 07/03/2019 1343   CALCIUM 9.0 05/25/2022 1316   PROT 5.9 (L) 05/25/2022 1316   ALBUMIN 3.8 05/25/2022 1316   AST 17 05/25/2022 1316   ALT 15 05/25/2022 1316   ALKPHOS 69 05/25/2022 1316   BILITOT 0.6 05/25/2022 1316   GFRNONAA >60 04/01/2022 2150   GFRAA >60 08/12/2020 1336     Diabetic Labs (most recent): Lab Results  Component Value Date   HGBA1C 7.9 (H) 04/01/2022   HGBA1C 8.3 (A) 02/21/2022   HGBA1C 7.6 (A) 08/09/2021   MICROALBUR 10 02/21/2022   MICROALBUR 30 02/23/2021     Lipid Panel ( most recent) Lipid Panel  No results found for: "CHOL", "TRIG", "HDL", "CHOLHDL", "VLDL", "LDLCALC", "LDLDIRECT", "LABVLDL"    Lab Results  Component Value Date   TSH 2.420 05/25/2022   TSH 3.235 05/02/2018   FREET4 1.10 05/25/2022         Assessment & Plan:   1) Type 2 diabetes mellitus with hyperglycemia, without long-term current use of insulin ([place])  He presents today with his meter, no logs, showing inconsistent glucose monitoring.  He does not always monitor glucose at the same time each day.  His previsit A1c on 5/14 was 7.9%, improving from last visit of 8.3%.  He denies any s/s of hypoglycemia.  Analysis of his meter shows 7-day average of 217 (3 readings); 14-day average of 206 (6 readings); 28-day average of 218 (10 readings).    - [name] has currently uncontrolled symptomatic type 2  DM since 81 years of age.   Recent labs reviewed.  - I had a long discussion with him about the progressive nature of diabetes and the pathology behind its complications.  -his diabetes is complicated by CAD, peripheral neuropathy and he remains at a high risk for more acute and chronic complications which include CAD, CVA, CKD, retinopathy, and neuropathy. These are all discussed in detail with him.  - Nutritional counseling repeated at each appointment due to patients tendency to fall back in to old habits.  - The patient admits there is a room for improvement in their diet and drink choices. -  Suggestion is made for the patient to avoid simple carbohydrates from their diet including Cakes, Sweet Desserts / Pastries, Ice Cream, Soda (diet and regular), Sweet Tea, Candies, Chips, Cookies, Sweet Pastries, Store Bought Juices, Alcohol in Excess of 1-2 drinks a day, Artificial Sweeteners, Coffee Creamer, and "Sugar-free" Products. This will help patient to have stable blood glucose profile and potentially avoid unintended weight gain.   - I encouraged the patient to switch to unprocessed or minimally processed complex starch and increased protein intake (animal or plant source), fruits, and vegetables.   - Patient is advised to stick to a routine mealtimes to eat 3 meals a day and avoid unnecessary snacks (to snack only to correct hypoglycemia).  - I have approached him with the following individualized plan to manage  his diabetes and  patient agrees:   -He is advised to continue his Metformin 1000 mg ER daily with breakfast and continue Glipizide 5 mg po daily with breakfast.    -He is encouraged to start consistently monitoring blood glucose at least once daily, before breakfast and to notify the clinic if his glucose readings are less than 70 or above 200 for 3 tests in a row.  - Specific targets for  A1c;  LDL, HDL,  and Triglycerides were discussed with the patient.  2) Blood Pressure  /Hypertension:  His blood pressure is controlled to target without the use of antihypertensive medications.  3) Lipids/Hyperlipidemia:  There are no recent lipid panel results to review, nor does he take any medication for high cholesterol.  He reports he has had his cholesterol checked at his PCP, although no records have been sent to [place].   4)  Weight/Diet:  His Body mass index is 73.41 kg/m.  - complicating his diabetes care.   he is a candidate for some weight loss. I discussed with him the fact that loss of 5 - 10% of his  current body weight will have the most impact on his diabetes management.  Exercise, and detailed carbohydrates information provided  -  detailed on discharge instructions.  5) Chronic Care/Health Maintenance: -he is encouraged to initiate and continue to follow up with Ophthalmology, Dentist,  Podiatrist at least yearly or according to recommendations, and advised to stay away from smoking and/or secondhand smoke exposure. I have recommended yearly flu vaccine and pneumonia vaccine at least every 5 years; moderate intensity exercise for up to 150 minutes weekly; and  sleep for at least 7 hours a day.  6) Fatigue He complains of debilitating fatigue, finds himself falling asleep mid conversation with his family.  This could potentially be due to higher glucose readings, polypharmacy, thyroid dysfunction, or low vitamin D.  His thyroid function tests are WNL and his Vitamin D is only marginally low.  He notes he is taking daily supplementation.  He is advised to continue.  I suspect he may be having adverse effects of gabapentin causing drowsiness which increases his risk of falls.  We talked about this concern today.  - he is advised to maintain close follow up with Redmond School, MD for primary care needs, as well as his other providers for optimal and coordinated care.      I spent 40 minutes in the care of the patient today including review of labs from [facility], Lipids,  Thyroid Function, Hematology (current and previous including abstractions from other facilities); face-to-face time discussing  his blood glucose readings/logs, discussing hypoglycemia and hyperglycemia episodes and symptoms, medications doses, his options of short and long term treatment based on the latest standards of care / guidelines;  discussion about incorporating lifestyle medicine;  and documenting the encounter. Risk reduction counseling performed per USPSTF guidelines to reduce obesity and cardiovascular risk factors.     Please refer to Patient Instructions for Blood Glucose Monitoring and Insulin/Medications Dosing Guide"  in media tab for additional information. Please  also refer to " Patient Self Inventory" in the Media  tab for reviewed elements of pertinent patient history.  [name] participated in the discussions, expressed understanding, and voiced agreement with the above plans.  All questions were answered to his satisfaction. he is encouraged to contact clinic should he have any questions or concerns prior to his return visit.    Follow up plan: - Return in about 3 months (around  08/30/2022) for Diabetes F/U with A1c in office, No previsit labs, Bring meter and logs.  Rayetta Pigg, [facility] [address] [place], [place] [address] Phone: [phone] Fax: [phone]  05/30/2022, 3:02 PM

## 2022-05-31 ENCOUNTER — Ambulatory Visit (INDEPENDENT_AMBULATORY_CARE_PROVIDER_SITE_OTHER): Payer: Medicare Other | Admitting: *Deleted

## 2022-05-31 DIAGNOSIS — Z5181 Encounter for therapeutic drug level monitoring: Secondary | ICD-10-CM

## 2022-05-31 DIAGNOSIS — I4891 Unspecified atrial fibrillation: Secondary | ICD-10-CM | POA: Diagnosis not present

## 2022-05-31 LAB — POCT INR: INR: 2.9 (ref 2.0–3.0)

## 2022-05-31 NOTE — Patient Instructions (Signed)
Continue warfarin 1 tablet daily except 1 1/2 tablets on Saturdays Recheck INR in 4 wk Call us with any new medications, changes or any bleeding concerns @ [phone].

## 2022-06-05 ENCOUNTER — Ambulatory Visit (HOSPITAL_COMMUNITY): Payer: Medicare Other | Admitting: Occupational Therapy

## 2022-06-05 DIAGNOSIS — M79642 Pain in left hand: Secondary | ICD-10-CM | POA: Diagnosis not present

## 2022-06-05 DIAGNOSIS — R29898 Other symptoms and signs involving the musculoskeletal system: Secondary | ICD-10-CM | POA: Diagnosis not present

## 2022-06-05 DIAGNOSIS — M25632 Stiffness of left wrist, not elsewhere classified: Secondary | ICD-10-CM | POA: Diagnosis not present

## 2022-06-05 DIAGNOSIS — R278 Other lack of coordination: Secondary | ICD-10-CM

## 2022-06-05 NOTE — Therapy (Signed)
OUTPATIENT OCCUPATIONAL THERAPY TREATMENT NOTE   Patient Name: [name] MRN: [id number] DOB:15-Nov-1941, 81 y.o., male Today's Date: 06/05/2022  PCP: Redmond School, MD REFERRING PROVIDER: Larena Glassman, MD  END OF SESSION:   OT End of Session - 06/05/22 1411     Visit Number 2    Number of Visits 12    Date for OT Re-Evaluation 07/13/22    Authorization Type 2)Medicare Part A and B   2)BCBS Supplement    OT Start Time 1302    OT Stop Time 1347    OT Time Calculation (min) 45 min    Activity Tolerance Patient tolerated treatment well    Behavior During Therapy [facility] for tasks assessed/performed             Past Medical History:  Diagnosis Date   Aortic atherosclerosis ([place])    Barrett's esophagus    Short-segment, diagnosed in 2007 by Dr. [name]   BPH (benign prostatic hyperplasia)    CAD (coronary artery disease)    a. cath on 07/21/2019 showing patent LM and LCx with 40-60% Proximal LAD and 30-40% Pro-RCA stenosis with normal LV function.   Essential hypertension    GERD (gastroesophageal reflux disease)    Hiatal hernia    History of kidney stones    PAF (paroxysmal atrial fibrillation) ([place])    S/P colonoscopy 2007   S/P endoscopy 08/2010    Salmon-colored epithelium coming up to 37 cm from the    Type 2 diabetes mellitus ([place])    Vertigo    Past Surgical History:  Procedure Laterality Date   APPENDECTOMY     BIOPSY  01/28/2020   Procedure: BIOPSY;  Surgeon: [name], MD;  [facility];  Service: Endoscopy;;  esophagus   CHOLECYSTECTOMY     COLONOSCOPY  02/06/2006   Dr. [name]- single anal papilla, o/w normal rectum, normal colon   COLONOSCOPY N/A 09/13/2016   RMR: three 5-6 mm for adenomas removed.  Recommend one last surveillance colonoscopy in October 2020.   COLONOSCOPY WITH PROPOFOL N/A 01/28/2020   Procedure: COLONOSCOPY WITH PROPOFOL;  Surgeon: [name], MD;  [facility];  Service: Endoscopy;  Laterality: N/A;   10:45am   CYSTOSCOPY WITH RETROGRADE PYELOGRAM, URETEROSCOPY AND STENT PLACEMENT Right 08/18/2020   Procedure: CYSTOSCOPY WITH INCISION OF RIGHT URETEROCELE, URETEROSCOPY WITH HOLMIUM LASER, CYSTOLITHOLAPAXY OF 10 MM BLADDER STONE;  Surgeon: [name], MD;  [facility];  Service: Urology;  Laterality: Right;   ESOPHAGOGASTRODUODENOSCOPY  09/15/2010   Dr. [name]- Salmon-colored epithelium coming up to 37 cm from the    ESOPHAGOGASTRODUODENOSCOPY N/A 07/02/2013   Dr. [name]- Barretts on bx. hiatal hernia   ESOPHAGOGASTRODUODENOSCOPY N/A 09/13/2016   RMR: Barrett's esophagus without dysplasia. one last EGD in 3 years.    ESOPHAGOGASTRODUODENOSCOPY (EGD) WITH PROPOFOL N/A 01/28/2020   Procedure: ESOPHAGOGASTRODUODENOSCOPY (EGD) WITH PROPOFOL;  Surgeon: [name], MD;  [facility];  Service: Endoscopy;  Laterality: N/A;   INTRAVASCULAR PRESSURE WIRE/FFR STUDY N/A 07/21/2019   Procedure: INTRAVASCULAR PRESSURE WIRE/FFR STUDY;  Surgeon: [name], MD;  Location: [place] CV LAB;  Service: Cardiovascular;  Laterality: N/A;   LEFT HEART CATH AND CORONARY ANGIOGRAPHY N/A 07/21/2019   Procedure: LEFT HEART CATH AND CORONARY ANGIOGRAPHY;  Surgeon: [name], MD;  Location: [place] CV LAB;  Service: Cardiovascular;  Laterality: N/A;   POLYPECTOMY  01/28/2020   Procedure: POLYPECTOMY;  Surgeon: [name], MD;  [facility];  Service: Endoscopy;;  colon   ROTATOR CUFF REPAIR     YAG LASER APPLICATION Right 03/25/4331   Procedure: YAG LASER APPLICATION;  Surgeon: [name], MD;  Location: [facility];  Service: Ophthalmology;  Laterality: Right;   Patient Active Problem List   Diagnosis Date Noted   Subdural hematoma ([place]) 04/01/2022   DOE (dyspnea on exertion) 06/28/2020   Long-term current use of opiate analgesic 02/10/2020   Arthritis of right sacroiliac joint 02/08/2020   Lumbar radiculopathy 02/08/2020   Polyneuropathy due to type 2 diabetes mellitus  ([place]) 02/08/2020   Restless legs 02/08/2020   Chronic low back pain 02/08/2020   Polyarthropathy 02/08/2020   Fatigue 10/19/2019   H/O adenomatous polyp of colon 10/19/2019   Abnormal cardiac CT angiography    Encounter for therapeutic drug monitoring 07/02/2018   Atrial fibrillation ([place]) 06/24/2018   DIZZINESS 05/09/2010   Angina pectoris ([place]) 05/09/2010   GERD 01/31/2009   BARRETTS ESOPHAGUS 01/31/2009    ONSET DATE: 04/02/22  REFERRING DIAG: R51.884Z (ICD-10-CM) - Closed chip fracture of left triquetrum with routine healing, subsequent encounter  THERAPY DIAG:  Pain in left hand  Stiffness of left wrist, not elsewhere classified  Other lack of coordination  Other symptoms and signs involving the musculoskeletal system  Rationale for Evaluation and Treatment Rehabilitation  PERTINENT HISTORY: Pt reports a fall on 04/02/22 which resulted in a closed chip fracture of left triquetrum. He was instructed to wear a removable wrist brace for 5 weeks and encouraged to complete A/ROM exercises. He continued to have pain and stiffness, returned to specialist with a negative left wrist x-ray. Dr. Amedeo Kinsman referred pt for an OT evaluation and treatment to address limited ROM and increased pain.   PRECAUTIONS: None  SUBJECTIVE: S: It hurts like a toothache.  PAIN:  Are you having pain? Yes: NPRS scale: 3/10 Pain location: left wrist Pain description: Aching Aggravating factors: certain movements Relieving factors: rest, ice     OBJECTIVE:   HAND DOMINANCE: Right   ADLs: Overall ADLs: Pt is independent in ADLs although has had to make some adaptations as certain activities cause increased pain.  He reports that he is unable to swing a golf club and is unable to pick up his 92-year-old grandson.        FUNCTIONAL OUTCOME MEASURES: Quick Dash: 43.18   UPPER EXTREMITY ROM      Active ROM Left eval  Wrist flexion 45  Wrist extension 28  Wrist ulnar deviation 12  Wrist  radial deviation 8  Wrist pronation 90  Wrist supination 81  (Blank rows = not tested)   Active ROM Left eval  Index MCP (0-90) 72   Index PIP (0-100)  50  Index DIP (0-70)  52  Long MCP (0-90)  78  Long PIP (0-100)  71  Long DIP (0-70)  53  Ring MCP (0-90)  72  Ring PIP (0-100)  71  Ring DIP (0-70)  51  Little MCP (0-90)  70  Little PIP (0-100)  62  Little DIP (0-70)  50  (Blank rows = not tested)     UPPER EXTREMITY MMT:      MMT Left eval  Elbow flexion  4+/5  Elbow extension  4+/5  Wrist flexion  4/5  Wrist extension  4/5  Wrist ulnar deviation  4/5  Wrist radial deviation  4+/5  Wrist pronation 4/5  Wrist supination  4/5  (Blank rows = not tested)   HAND FUNCTION:  Grip strength: Right: 72 lbs; Left: 21 lbs, Lateral pinch: Right: 18 lbs, Left: 11 lbs, and 3 point pinch: Right: 14 lbs, Left: 6 lbs   COORDINATION: 9 Hole Peg test: Right: 38 sec; Left: 32 sec   SENSATION: Pt reports decreased sensation in tips of fingers, especially D4/5   EDEMA: Mild swelling throughout LUE   OBSERVATIONS: mild fascial restrictions throughout LUE       TODAY'S TREATMENT: 06/05/22 -myofascial release of LUE wrist, forearm flexors and extensors, palm region, to decrease pain and fascial restrictions and increase joint ROM -Wrist strengthening: left wrist flexion, extension, radial deviation, 10 reps each -Forearm strengthening: pronation, supination, 10 reps each -Grip strengthening: all large, medium, and small beads at 25#, vertical gripper -Sponges: Pt picking up high resistance sponges, 7 at a time -Pt holding one high resistance sponges in palm, working on translating from palm to fingertips then making circles in the palm with the sponge   PATIENT EDUCATION: Education details: Person educated: Patient Education method: Consulting civil engineer, Media planner, and Handouts Education comprehension: verbalized understanding and returned demonstration     HOME EXERCISE  PROGRAM: Eval: Tendon glides, Wrist A/ROM  GOALS: Goals reviewed with patient? Yes   SHORT TERM GOALS: Target date: 07/13/22   Pt will be provided with and educated on HEP to improve mobility in LUE required for ADL completion.  Goal status: Ongoing   2.  Pt will decrease pain in LUE to 2/10 or less in order to swing a golf club with less pain.  Goal status: Ongoing   3.  Pt will increase ROM in left digits to improve ability to form a full grasp required for holding items.  Goal status: Ongoing   4.  Pt will increase left grip strength by 15# and pinch strength by 4# to improve ability to grasp and hold pots and pans during meal preparation.  Goal status:Ongoing   5.  Pt will decrease LUE fascial restrictions to trace amounts or less to improve mobility required for fine motor tasks such as opening containers and food packages.  Goal status: Ongoing        ASSESSMENT:   CLINICAL IMPRESSION: Pt reports his wrist hurts like a toothache and he has difficulty gripping items. Initiated myofascial release to address fascial restrictions along wrist and distal forearm areas. Pt with full digit flexion today, good wrist ROM today, no P/ROM completed. Pt completing wrist strengthening, grip strengthening today. When initiating grip strengthening pt noting tenderness proximal to 3rd digit going into palm, manual techniques to address tightened tendons and fascial restrictions in palm. After manual techniques pt able to complete hand gripper activity with improved success. Also initiated in-hand manipulation, working on intrinsic muscle activation and mobility. Verbal and visual cuing for form and technique.    PLAN: OT FREQUENCY: 2x/week   OT DURATION: 6 weeks   PLANNED INTERVENTIONS: self care/ADL training, therapeutic exercise, therapeutic activity, manual therapy, passive range of motion, electrical stimulation, ultrasound, paraffin, moist heat, cryotherapy, energy conservation, and DME  and/or AE instructions     CONSULTED AND AGREED WITH PLAN OF CARE: Patient   PLAN FOR NEXT SESSION:  continue with grip strengthening, add pinch task, coordination targeting digit mobility       Guadelupe Sabin, OTR/L  [phone] 06/05/2022, 2:12 PM

## 2022-06-07 ENCOUNTER — Ambulatory Visit (INDEPENDENT_AMBULATORY_CARE_PROVIDER_SITE_OTHER): Payer: Medicare Other | Admitting: Urology

## 2022-06-07 ENCOUNTER — Ambulatory Visit (HOSPITAL_COMMUNITY): Payer: Medicare Other

## 2022-06-07 ENCOUNTER — Encounter (HOSPITAL_COMMUNITY): Payer: Self-pay

## 2022-06-07 ENCOUNTER — Encounter: Payer: Self-pay | Admitting: Urology

## 2022-06-07 VITALS — BP 122/67 | HR 90

## 2022-06-07 DIAGNOSIS — N200 Calculus of kidney: Secondary | ICD-10-CM

## 2022-06-07 DIAGNOSIS — R278 Other lack of coordination: Secondary | ICD-10-CM

## 2022-06-07 DIAGNOSIS — R3912 Poor urinary stream: Secondary | ICD-10-CM | POA: Diagnosis not present

## 2022-06-07 DIAGNOSIS — M25632 Stiffness of left wrist, not elsewhere classified: Secondary | ICD-10-CM

## 2022-06-07 DIAGNOSIS — M79642 Pain in left hand: Secondary | ICD-10-CM

## 2022-06-07 DIAGNOSIS — N401 Enlarged prostate with lower urinary tract symptoms: Secondary | ICD-10-CM

## 2022-06-07 DIAGNOSIS — N138 Other obstructive and reflux uropathy: Secondary | ICD-10-CM

## 2022-06-07 DIAGNOSIS — R29898 Other symptoms and signs involving the musculoskeletal system: Secondary | ICD-10-CM

## 2022-06-07 DIAGNOSIS — N5201 Erectile dysfunction due to arterial insufficiency: Secondary | ICD-10-CM

## 2022-06-07 DIAGNOSIS — Q631 Lobulated, fused and horseshoe kidney: Secondary | ICD-10-CM | POA: Diagnosis not present

## 2022-06-07 DIAGNOSIS — I2510 Atherosclerotic heart disease of native coronary artery without angina pectoris: Secondary | ICD-10-CM

## 2022-06-07 DIAGNOSIS — R972 Elevated prostate specific antigen [PSA]: Secondary | ICD-10-CM

## 2022-06-07 LAB — URINALYSIS, ROUTINE W REFLEX MICROSCOPIC
Bilirubin, UA: NEGATIVE
Leukocytes,UA: NEGATIVE
Nitrite, UA: NEGATIVE
Protein,UA: NEGATIVE
Specific Gravity, UA: 1.02 (ref 1.005–1.030)
Urobilinogen, Ur: 0.2 mg/dL (ref 0.2–1.0)
pH, UA: 5 (ref 5.0–7.5)

## 2022-06-07 LAB — MICROSCOPIC EXAMINATION
Renal Epithel, UA: NONE SEEN /hpf
WBC, UA: NONE SEEN /hpf (ref 0–5)

## 2022-06-07 MED ORDER — TADALAFIL 20 MG PO TABS: 20.0000 mg | ORAL_TABLET | Freq: Every day | ORAL | 11 refills | Status: DC | PRN

## 2022-06-07 NOTE — Progress Notes (Signed)
H&P  Chief Complaint: LUTS  History of Present Illness:  06/07/22: Khyle returns today in f/u.  He has some progressive ED.  He didn't respond to tadalafil '5mg'$  or sildenafil.  He remains of finasteride and tamsulosin.  His IPSS is 17 with a reduced stream, intermittency and urgency.  He has nocturia x 2.  He has had no flank pain or hematuria.  He has a history of stones and a horseshoe kidney.  He remains on warfarin.   3/16/23Orpah Greek returns today in f/u.  His PSA was down to 3.2 on 08/10/21 from 6.3 on 12/04/19.  He reports malodorous urine but no hematuria or dysuria.  He remains on finasteride and tamsulosin 0.'8mg'$ .  The nocturia is down to 1-2x.  He has a decent stream during the day.  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  He has ED and has tadalafil and sildenafil but hasn't had much response.  The tadalafil was expensive.   08/10/21: Dwith returns today in f/u.  He is current on finasteride and tamsulosin 0.8 mg daily for his BPH with BOO and he reports improvement in his symptoms with the addition of the finasteride.  His IPSS is down to 23 from 30.  He has a good stream in the day that is slow at night.  He has nocturia x 3.  He has intermittency at night.  He has no incontinence.  He has had no hematuria or flank pain.  He can get a minimal partial erection and is inquiring about options.    02/02/21: Darnel returns today in f/u.  He was given doxycyline on 11/04/20 for possible diverticulitis.   He had the CT I ordered on 12/26/20 and it showed diverticular disease without diverticulitis.  He has a horseshoe kidney with no residual stone disease or obstruction.  He has fairly severe LUTS with an IPSS of 30.  His urgency, frequency and intermittency.  He has a reduced stream at night and nocturia x 3.  He remains on tamsulosin.  He feels generally poorly today but has no fever.  He has no hematuria or dysuria.   12/17/21Orpah Greek returns today with a 1 week  history of LLQ pain and hesitancy with frequent small voids.  He is not sleeping well because of the pain.  He has a history of stones and prostatitis.  He UA has 0-2 RBC's.  He had the Covid booster a week ago and had fever with that.  He has had no nausea.  He has a horseshoe kidney that had no left stones on a CT in 9/21.  He did have diverticular disease.   09/02/20: Cedrik returns today in f/u from recent right ureteral stone extraction.  The stone was impacted and I had to incise the ureteral meatus to release the stone and then fragment it to get it out of the bladder.   He has no hematuria.   He has back pain but no flank pain .  He is voiding well.  His IPSS is 21.  07/01/20: Antjuan returns today in f/u.  He reports resolution of the penile lesion.   He continues to have nocturia but he is getting up a lot with his wife who has to get up.   He remains on tamsulosin.  His IPSS is 24.  He has been having some progressive SOB and Dr. Melvyn Novas had some questions about whether the tamsulosin may be contributing to the  breathing issues.   His PSA in 1/21 was 6.3 which was down from 9.7.  He has some LLQ discomfort at times.  He has a horseshoe kidney and a right renal stone on a CT in 2015.   3.23.2021: Most recent PSA was 6.3 on 1.5.2021. He presents today c/o of sore spot underneath his penis that he has had for the last 2 wks. This has been too painful to touch. He has tried managing pain with medications and hot baths without success. He has also tried to "pop" it without producing any drainage. This pain has started to lessen in severity but he has recently begun having some mild dysuria and increased nocturia. Otherwise, his urinary sx's are stable.      01/27/19 10/23/18 10/17/17 09/18/16 09/06/15 03/02/15 04/03/14 06/13/12  PSA  Total PSA 6.4 ng/dl 9.7 ng/dl 5.3 ng/dl 5.8  4.66  5.64  5.20  4.36   Free PSA    1.0  0.56  0.58  0.64  0.62   % Free PSA  8 %  '17  12  10  12  14    '$ ROS: Fatigue,,  Cough and Easy bruising.   Past Medical History:  Diagnosis Date   Aortic atherosclerosis ([place])    Barrett's esophagus    Short-segment, diagnosed in 2007 by Dr. [name]   BPH (benign prostatic hyperplasia)    CAD (coronary artery disease)    a. cath on 07/21/2019 showing patent LM and LCx with 40-60% Proximal LAD and 30-40% Pro-RCA stenosis with normal LV function.   Essential hypertension    GERD (gastroesophageal reflux disease)    Hiatal hernia    History of kidney stones    PAF (paroxysmal atrial fibrillation) ([place])    S/P colonoscopy 2007   S/P endoscopy 08/2010    Salmon-colored epithelium coming up to 37 cm from the    Type 2 diabetes mellitus ([place])    Vertigo     Past Surgical History:  Procedure Laterality Date   APPENDECTOMY     BIOPSY  01/28/2020   Procedure: BIOPSY;  Surgeon: [name], MD;  [facility];  Service: Endoscopy;;  esophagus   CHOLECYSTECTOMY     COLONOSCOPY  02/06/2006   Dr. [name]- single anal papilla, o/w normal rectum, normal colon   COLONOSCOPY N/A 09/13/2016   RMR: three 5-6 mm for adenomas removed.  Recommend one last surveillance colonoscopy in October 2020.   COLONOSCOPY WITH PROPOFOL N/A 01/28/2020   Procedure: COLONOSCOPY WITH PROPOFOL;  Surgeon: [name], MD;  [facility];  Service: Endoscopy;  Laterality: N/A;  10:45am   CYSTOSCOPY WITH RETROGRADE PYELOGRAM, URETEROSCOPY AND STENT PLACEMENT Right 08/18/2020   Procedure: CYSTOSCOPY WITH INCISION OF RIGHT URETEROCELE, URETEROSCOPY WITH HOLMIUM LASER, CYSTOLITHOLAPAXY OF 10 MM BLADDER STONE;  Surgeon: [name], MD;  [facility];  Service: Urology;  Laterality: Right;   ESOPHAGOGASTRODUODENOSCOPY  09/15/2010   Dr. [name]- Salmon-colored epithelium coming up to 37 cm from the    ESOPHAGOGASTRODUODENOSCOPY N/A 07/02/2013   Dr. [name]- Barretts on bx. hiatal hernia   ESOPHAGOGASTRODUODENOSCOPY N/A 09/13/2016   RMR: Barrett's esophagus without dysplasia. one last  EGD in 3 years.    ESOPHAGOGASTRODUODENOSCOPY (EGD) WITH PROPOFOL N/A 01/28/2020   Procedure: ESOPHAGOGASTRODUODENOSCOPY (EGD) WITH PROPOFOL;  Surgeon: [name], MD;  [facility];  Service: Endoscopy;  Laterality: N/A;   INTRAVASCULAR PRESSURE WIRE/FFR STUDY N/A 07/21/2019   Procedure: INTRAVASCULAR PRESSURE WIRE/FFR STUDY;  Surgeon: Daneen Schick  W, MD;  Location: [place] CV LAB;  Service: Cardiovascular;  Laterality: N/A;   LEFT HEART CATH AND CORONARY ANGIOGRAPHY N/A 07/21/2019   Procedure: LEFT HEART CATH AND CORONARY ANGIOGRAPHY;  Surgeon: [name], MD;  Location: [place] CV LAB;  Service: Cardiovascular;  Laterality: N/A;   POLYPECTOMY  01/28/2020   Procedure: POLYPECTOMY;  Surgeon: [name], MD;  [facility];  Service: Endoscopy;;  colon   ROTATOR CUFF REPAIR     YAG LASER APPLICATION Right 04/19/4430   Procedure: YAG LASER APPLICATION;  Surgeon: [name], MD;  [facility];  Service: Ophthalmology;  Laterality: Right;    Home Medications:  Allergies as of 06/07/2022   No Known Allergies      Medication List        Accurate as of June 07, 2022  4:16 PM. If you have any questions, ask your nurse or doctor.          STOP taking these medications    HYDROcodone-acetaminophen 5-325 MG tablet Commonly known as: NORCO/VICODIN Stopped by: [name], MD       TAKE these medications    Accu-Chek FastClix Lancets Misc 1 application by Does not apply route 2 (two) times daily.   allopurinol 100 MG tablet Commonly known as: ZYLOPRIM Take 100 mg by mouth daily.   ALPRAZolam 1 MG tablet Commonly known as: XANAX Take 0.5 mg by mouth at bedtime as needed for anxiety or sleep.   DULoxetine 60 MG capsule Commonly known as: CYMBALTA Take 60 mg by mouth daily.   escitalopram 10 MG tablet Commonly known as: LEXAPRO Take 10 mg by mouth daily.   fexofenadine 180 MG tablet Commonly known as: ALLEGRA Take 180 mg by  mouth daily.   finasteride 5 MG tablet Commonly known as: PROSCAR Take 1 tablet (5 mg total) by mouth daily.   gabapentin 300 MG capsule Commonly known as: NEURONTIN Take 300 mg by mouth 2 (two) times daily.   glipiZIDE 5 MG tablet Commonly known as: GLUCOTROL Take 5 mg by mouth daily.   glucose blood test strip 1 each by Other route daily before breakfast. Use as instructed   metFORMIN 500 MG 24 hr tablet Commonly known as: GLUCOPHAGE-XR Take 2 tablets (1,000 mg total) by mouth daily with breakfast.   pantoprazole 40 MG tablet Commonly known as: PROTONIX TAKE ONE TABLET ('40MG'$  TOTAL) BY MOUTH TWO TIMES DAILY BEFORE A MEAL   rosuvastatin 10 MG tablet Commonly known as: CRESTOR TAKE ONE TABLET ('10MG'$  TOTAL) BY MOUTH DAILY   sildenafil 50 MG tablet Commonly known as: VIAGRA Take 50 mg by mouth daily.   tadalafil 20 MG tablet Commonly known as: CIALIS Take 1 tablet (20 mg total) by mouth daily as needed for erectile dysfunction. What changed:  medication strength how much to take Changed by: [name], MD   tamsulosin 0.4 MG Caps capsule Commonly known as: FLOMAX Take 1 capsule (0.4 mg total) by mouth 2 (two) times daily.   traMADol 50 MG tablet Commonly known as: ULTRAM Take 1 tablet (50 mg total) by mouth every 6 (six) hours as needed for moderate pain (pain.).   Vitamin D3 50 MCG (2000 UT) Tabs Take 2,000 Units by mouth daily.   warfarin 5 MG tablet Commonly known as: COUMADIN Take as directed by the anticoagulation clinic. If you are unsure how to take this medication, talk to your nurse or doctor. Original instructions: TAKE ONE TABLET BY MOUTH DAILY EXCEPT  TAKE ONE AND ONE-HALF TABLETS ON SATURDAY OR AS DIRECTED        Allergies: No Known Allergies  Family History  Problem Relation Age of Onset   Prostate cancer Father        deceased   Stroke Mother        deceased   Colon cancer Neg Hx     Social History:  reports that he quit smoking about  28 years ago. His smoking use included cigarettes. He has a 45.00 pack-year smoking history. He has never used smokeless tobacco. He reports that he does not drink alcohol and does not use drugs.  ROS: A complete review of systems was performed.  All systems are negative except for pertinent findings as noted.  Physical Exam:  BP 122/67   Pulse 90   Gen: WD, WN in NAD.   Laboratory Data:  UA has 0-2 RBC's.   Results for orders placed or performed in visit on 06/07/22 (from the past 24 hour(s))  Urinalysis, Routine w reflex microscopic     Status: Abnormal   Collection Time: 06/07/22  2:47 PM  Result Value Ref Range   Specific Gravity, UA 1.020 1.005 - 1.030   pH, UA 5.0 5.0 - 7.5   Color, UA Yellow Yellow   Appearance Ur Clear Clear   Leukocytes,UA Negative Negative   Protein,UA Negative Negative/Trace   Glucose, UA 1+ (A) Negative   Ketones, UA 1+ (A) Negative   RBC, UA Trace (A) Negative   Bilirubin, UA Negative Negative   Urobilinogen, Ur 0.2 0.2 - 1.0 mg/dL   Nitrite, UA Negative Negative   Microscopic Examination See below:    Narrative   Performed at:  [facility] [address], [place], [place]  [id number] Lab Director: Mina Marble MT, Phone:  [phone]  Microscopic Examination     Status: Abnormal   Collection Time: 06/07/22  2:47 PM   Urine  Result Value Ref Range   WBC, UA None seen 0 - 5 /hpf   RBC, Urine 0-2 0 - 2 /hpf   Epithelial Cells (non renal) 0-10 0 - 10 /hpf   Renal Epithel, UA None seen None seen /hpf   Mucus, UA Present Not Estab.   Bacteria, UA Few (A) None seen/Few   Narrative   Performed at:  [facility] [address] [place], [place]  [id number] Lab Director: Mina Marble MT, Phone:  [phone]        Impression/Assessment:  ED.  This has progressed.  He was only taking tadalafil '5mg'$  prn.  I will increase him to '20mg'$ .  I also discussed Eroxon, the VED and injections.   I mentioned the potential  impact of finasteride on erectile function as well.   BPH with BOO he has stable LUTS on current therapy.   Elevated PSA.  His PSA was down 50% on finasteride.    Hx of horseshoe kidney with stones.   CT on 12/26/20 was ok.   He has no symptoms        Orders Placed This Encounter  Procedures   Microscopic Examination   Urinalysis, Routine w reflex microscopic   Meds ordered this encounter  Medications   tadalafil (CIALIS) 20 MG tablet    Sig: Take 1 tablet (20 mg total) by mouth daily as needed for erectile dysfunction.    Dispense:  30 tablet    Refill:  11   Return for He should keep his f/u in  September the the PSA that was ordered. .   CC: Dr. Redmond School.  Patient ID: [name], male   DOB: 07/10/1941, 81 y.o.   MRN: [id number]

## 2022-06-07 NOTE — Therapy (Signed)
OUTPATIENT OCCUPATIONAL THERAPY TREATMENT NOTE   Patient Name: [name] MRN: [id number] DOB:February 04, 1941, 81 y.o., male Today's Date: 06/07/2022  PCP: Redmond School, MD REFERRING PROVIDER: Larena Glassman, MD  END OF SESSION:   OT End of Session - 06/07/22 1415     Visit Number 3    Number of Visits 12    Date for OT Re-Evaluation 07/13/22    Authorization Type 2)Medicare Part A and B   2)BCBS Supplement    OT Start Time 1347    OT Stop Time 1416    OT Time Calculation (min) 29 min    Activity Tolerance Patient tolerated treatment well    Behavior During Therapy [facility] for tasks assessed/performed              Past Medical History:  Diagnosis Date   Aortic atherosclerosis ([place])    Barrett's esophagus    Short-segment, diagnosed in 2007 by Dr. [name]   BPH (benign prostatic hyperplasia)    CAD (coronary artery disease)    a. cath on 07/21/2019 showing patent LM and LCx with 40-60% Proximal LAD and 30-40% Pro-RCA stenosis with normal LV function.   Essential hypertension    GERD (gastroesophageal reflux disease)    Hiatal hernia    History of kidney stones    PAF (paroxysmal atrial fibrillation) ([place])    S/P colonoscopy 2007   S/P endoscopy 08/2010    Salmon-colored epithelium coming up to 37 cm from the    Type 2 diabetes mellitus ([place])    Vertigo    Past Surgical History:  Procedure Laterality Date   APPENDECTOMY     BIOPSY  01/28/2020   Procedure: BIOPSY;  Surgeon: [name], MD;  [facility];  Service: Endoscopy;;  esophagus   CHOLECYSTECTOMY     COLONOSCOPY  02/06/2006   Dr. [name]- single anal papilla, o/w normal rectum, normal colon   COLONOSCOPY N/A 09/13/2016   RMR: three 5-6 mm for adenomas removed.  Recommend one last surveillance colonoscopy in October 2020.   COLONOSCOPY WITH PROPOFOL N/A 01/28/2020   Procedure: COLONOSCOPY WITH PROPOFOL;  Surgeon: [name], MD;  [facility];  Service: Endoscopy;  Laterality: N/A;   10:45am   CYSTOSCOPY WITH RETROGRADE PYELOGRAM, URETEROSCOPY AND STENT PLACEMENT Right 08/18/2020   Procedure: CYSTOSCOPY WITH INCISION OF RIGHT URETEROCELE, URETEROSCOPY WITH HOLMIUM LASER, CYSTOLITHOLAPAXY OF 10 MM BLADDER STONE;  Surgeon: [name], MD;  [facility];  Service: Urology;  Laterality: Right;   ESOPHAGOGASTRODUODENOSCOPY  09/15/2010   Dr. [name]- Salmon-colored epithelium coming up to 37 cm from the    ESOPHAGOGASTRODUODENOSCOPY N/A 07/02/2013   Dr. [name]- Barretts on bx. hiatal hernia   ESOPHAGOGASTRODUODENOSCOPY N/A 09/13/2016   RMR: Barrett's esophagus without dysplasia. one last EGD in 3 years.    ESOPHAGOGASTRODUODENOSCOPY (EGD) WITH PROPOFOL N/A 01/28/2020   Procedure: ESOPHAGOGASTRODUODENOSCOPY (EGD) WITH PROPOFOL;  Surgeon: [name], MD;  [facility];  Service: Endoscopy;  Laterality: N/A;   INTRAVASCULAR PRESSURE WIRE/FFR STUDY N/A 07/21/2019   Procedure: INTRAVASCULAR PRESSURE WIRE/FFR STUDY;  Surgeon: [name], MD;  Location: [place] [facility];  Service: Cardiovascular;  Laterality: N/A;   LEFT HEART CATH AND CORONARY ANGIOGRAPHY N/A 07/21/2019   Procedure: LEFT HEART CATH AND CORONARY ANGIOGRAPHY;  Surgeon: [name], MD;  Location: [place] [facility];  Service: Cardiovascular;  Laterality: N/A;   POLYPECTOMY  01/28/2020   Procedure: POLYPECTOMY;  Surgeon: [name], MD;  [facility];  Service: Endoscopy;;  colon   ROTATOR CUFF REPAIR     YAG LASER APPLICATION Right 05/20/5365   Procedure: YAG LASER APPLICATION;  Surgeon: [name], MD;  Location: [facility];  Service: Ophthalmology;  Laterality: Right;   Patient Active Problem List   Diagnosis Date Noted   Subdural hematoma ([place]) 04/01/2022   DOE (dyspnea on exertion) 06/28/2020   Long-term current use of opiate analgesic 02/10/2020   Arthritis of right sacroiliac joint 02/08/2020   Lumbar radiculopathy 02/08/2020   Polyneuropathy due to type 2 diabetes mellitus  ([place]) 02/08/2020   Restless legs 02/08/2020   Chronic low back pain 02/08/2020   Polyarthropathy 02/08/2020   Fatigue 10/19/2019   H/O adenomatous polyp of colon 10/19/2019   Abnormal cardiac CT angiography    Encounter for therapeutic drug monitoring 07/02/2018   Atrial fibrillation ([place]) 06/24/2018   DIZZINESS 05/09/2010   Angina pectoris ([place]) 05/09/2010   GERD 01/31/2009   BARRETTS ESOPHAGUS 01/31/2009    ONSET DATE: 04/02/22  REFERRING DIAG: Y40.347Q (ICD-10-CM) - Closed chip fracture of left triquetrum with routine healing, subsequent encounter  THERAPY DIAG:  Pain in left hand  Stiffness of left wrist, not elsewhere classified  Other lack of coordination  Other symptoms and signs involving the musculoskeletal system  Rationale for Evaluation and Treatment Rehabilitation  PERTINENT HISTORY: Pt reports a fall on 04/02/22 which resulted in a closed chip fracture of left triquetrum. He was instructed to wear a removable wrist brace for 5 weeks and encouraged to complete A/ROM exercises. He continued to have pain and stiffness, returned to specialist with a negative left wrist x-ray. Dr. Amedeo Kinsman referred pt for an OT evaluation and treatment to address limited ROM and increased pain.   PRECAUTIONS: None  SUBJECTIVE: S: "I've been trying to move it a little more."  Pt had another apt following OT, requested short session. Pt appearing lethargic today and reported feeling "off"  PAIN:  Are you having pain? Yes: NPRS scale: 2/10 Pain location: left wrist Pain description: Aching Aggravating factors: certain movements Relieving factors: rest, ice     OBJECTIVE:   HAND DOMINANCE: Right   ADLs: Overall ADLs: Pt is independent in ADLs although has had to make some adaptations as certain activities cause increased pain.  He reports that he is unable to swing a golf club and is unable to pick up his 2-year-old grandson.        FUNCTIONAL OUTCOME MEASURES: Quick Dash:  43.18   UPPER EXTREMITY ROM      Active ROM Left eval  Wrist flexion 45  Wrist extension 28  Wrist ulnar deviation 12  Wrist radial deviation 8  Wrist pronation 90  Wrist supination 81  (Blank rows = not tested)   Active ROM Left eval  Index MCP (0-90) 72   Index PIP (0-100)  50  Index DIP (0-70)  52  Long MCP (0-90)  78  Long PIP (0-100)  71  Long DIP (0-70)  53  Ring MCP (0-90)  72  Ring PIP (0-100)  71  Ring DIP (0-70)  51  Little MCP (0-90)  70  Little PIP (0-100)  62  Little DIP (0-70)  50  (Blank rows = not tested)     UPPER EXTREMITY MMT:      MMT Left eval  Elbow flexion  4+/5  Elbow extension  4+/5  Wrist flexion  4/5  Wrist extension  4/5  Wrist ulnar deviation  4/5  Wrist  radial deviation  4+/5  Wrist pronation 4/5  Wrist supination  4/5  (Blank rows = not tested)   HAND FUNCTION: Grip strength: Right: 72 lbs; Left: 21 lbs, Lateral pinch: Right: 18 lbs, Left: 11 lbs, and 3 point pinch: Right: 14 lbs, Left: 6 lbs   COORDINATION: 9 Hole Peg test: Right: 38 sec; Left: 32 sec   SENSATION: Pt reports decreased sensation in tips of fingers, especially D4/5   EDEMA: Mild swelling throughout LUE   OBSERVATIONS: mild fascial restrictions throughout LUE       TODAY'S TREATMENT:  06/07/22 -myofascial release of LUE wrist, forearm flexors and extensors, palm region, to decrease pain and fascial restrictions and increase joint ROM -P/ROM: 1x10 flexion/extension at each digit joint, blocking. -P/ROM: 1x5 wrist flexion/extension with prolonged stretch at end range  -Grip strengthening: Yellow theraputty, 5x5" squeeze   06/05/22 -myofascial release of LUE wrist, forearm flexors and extensors, palm region, to decrease pain and fascial restrictions and increase joint ROM -Wrist strengthening: left wrist flexion, extension, radial deviation, 10 reps each -Forearm strengthening: pronation, supination, 10 reps each -Grip strengthening: all large, medium,  and small beads at 25#, vertical gripper -Sponges: Pt picking up high resistance sponges, 7 at a time -Pt holding one high resistance sponges in palm, working on translating from palm to fingertips then making circles in the palm with the sponge   PATIENT EDUCATION: Education details: Blocking digit flexion, Theraputty Person educated: Patient Education method: Consulting civil engineer, Demonstration, and Handouts Education comprehension: verbalized understanding and returned demonstration     HOME EXERCISE PROGRAM: Eval: Tendon glides, Wrist A/ROM 06/07/22: Digit ROM/blocking, Theraputty  GOALS: Goals reviewed with patient? Yes   SHORT TERM GOALS: Target date: 07/13/22   Pt will be provided with and educated on HEP to improve mobility in LUE required for ADL completion.  Goal status: Ongoing   2.  Pt will decrease pain in LUE to 2/10 or less in order to swing a golf club with less pain.  Goal status: Ongoing   3.  Pt will increase ROM in left digits to improve ability to form a full grasp required for holding items.  Goal status: Ongoing   4.  Pt will increase left grip strength by 15# and pinch strength by 4# to improve ability to grasp and hold pots and pans during meal preparation.  Goal status:Ongoing   5.  Pt will decrease LUE fascial restrictions to trace amounts or less to improve mobility required for fine motor tasks such as opening containers and food packages.  Goal status: Ongoing        ASSESSMENT:   CLINICAL IMPRESSION: Pt reports that he continues to feel tight at his wrist with increased pain with certain movements. Tight muscle fibers noted throughout forearm flexors and extensors, tight intrinsic muscles also noted. Some increased movement noted with hand/wrist range of motion following manual therapy and passive stretching. Introduced theraputty to addressed decreased grip strength, pt reporting a "pulling" into his forearm and some discomfort but no increased  pain.   PLAN: OT FREQUENCY: 2x/week   OT DURATION: 6 weeks   PLANNED INTERVENTIONS: self care/ADL training, therapeutic exercise, therapeutic activity, manual therapy, passive range of motion, electrical stimulation, ultrasound, paraffin, moist heat, cryotherapy, energy conservation, and DME and/or AE instructions     CONSULTED AND AGREED WITH PLAN OF CARE: Patient   PLAN FOR NEXT SESSION:  PRINT HEP for blocking and theraputty, continue with grip strengthening, add pinch task, coordination targeting digit mobility  [address] [place], OTR/L [phone] 06/07/2022, 2:17 PM

## 2022-06-12 ENCOUNTER — Ambulatory Visit (HOSPITAL_COMMUNITY): Payer: Medicare Other

## 2022-06-12 ENCOUNTER — Encounter (HOSPITAL_COMMUNITY): Payer: Self-pay

## 2022-06-12 DIAGNOSIS — R29898 Other symptoms and signs involving the musculoskeletal system: Secondary | ICD-10-CM | POA: Diagnosis not present

## 2022-06-12 DIAGNOSIS — M79642 Pain in left hand: Secondary | ICD-10-CM

## 2022-06-12 DIAGNOSIS — R278 Other lack of coordination: Secondary | ICD-10-CM

## 2022-06-12 DIAGNOSIS — M25632 Stiffness of left wrist, not elsewhere classified: Secondary | ICD-10-CM

## 2022-06-12 NOTE — Patient Instructions (Signed)
AROM Exercises   1) Wrist Flexion  Start with wrist at edge of table, palm facing up. With wrist hanging slightly off table, curl wrist upward, and back down.      2) Wrist Extension  Start with wrist at edge of table, palm facing down. With wrist slightly off the edge of the table, curl wrist up and back down.      3) Radial Deviations  Start with forearm flat against a table, wrist hanging slightly off the edge, and palm facing the wall. Bending at the wrist only, and keeping palm facing the wall, bend wrist so fist is pointing towards the floor, back up to start position, and up towards the ceiling. Return to start.        4) WRIST PRONATION  Turn your forearm towards palm face down.  Keep your elbow bent and by the side of your  Body.      5) WRIST SUPINATION  Turn your forearm towards palm face up.  Keep your elbow bent and by the side of your  Body.      *Complete exercises ______ times each, _______ times per day*      Strengthening Exercises  1) WRIST EXTENSION CURLS - TABLE  Hold a small free weight, rest your forearm on a table and bend your wrist up and down with your palm face down as shown.      2) WRIST FLEXION CURLS - TABLE  Hold a small free weight, rest your forearm on a table and bend your wrist up and down with your palm face up as shown.     3) FREE WEIGHT RADIAL/ULNAR DEVIATION - TABLE  Hold a small free weight, rest your forearm on a table and bend your wrist up and down with your palm facing towards the side as shown.     4) Pronation  Forearm supported on table with wrist in neutral position. Using a weight, roll wrist so that palm faces downward. Hold for 2 seconds and return to starting position.     5) Supination  Forearm supported on table with wrist in neutral position. Using a weight, roll wrist so that palm is now facing upward. Hold for 2 seconds and return to starting position.      *Complete  exercises using ____ pound weight, ____times each, ____times per day*  

## 2022-06-12 NOTE — Therapy (Signed)
OUTPATIENT OCCUPATIONAL THERAPY TREATMENT NOTE   Patient Name: [name] MRN: [id number] DOB:12-07-1940, 81 y.o., male Today's Date: 06/12/2022  PCP: Redmond School, MD REFERRING PROVIDER: Larena Glassman, MD  END OF SESSION:   OT End of Session - 06/12/22 1256     Visit Number 4    Number of Visits 12    Date for OT Re-Evaluation 07/13/22    Authorization Type 2)Medicare Part A and B   2)BCBS Supplement    OT Start Time 1300    OT Stop Time 1345    OT Time Calculation (min) 45 min    Activity Tolerance Patient tolerated treatment well    Behavior During Therapy [facility] for tasks assessed/performed              Past Medical History:  Diagnosis Date   Aortic atherosclerosis ([place])    Barrett's esophagus    Short-segment, diagnosed in 2007 by Dr. [name]   BPH (benign prostatic hyperplasia)    CAD (coronary artery disease)    a. cath on 07/21/2019 showing patent LM and LCx with 40-60% Proximal LAD and 30-40% Pro-RCA stenosis with normal LV function.   Essential hypertension    GERD (gastroesophageal reflux disease)    Hiatal hernia    History of kidney stones    PAF (paroxysmal atrial fibrillation) ([place])    S/P colonoscopy 2007   S/P endoscopy 08/2010    Salmon-colored epithelium coming up to 37 cm from the    Type 2 diabetes mellitus ([place])    Vertigo    Past Surgical History:  Procedure Laterality Date   APPENDECTOMY     BIOPSY  01/28/2020   Procedure: BIOPSY;  Surgeon: [name], MD;  [facility];  Service: Endoscopy;;  esophagus   CHOLECYSTECTOMY     COLONOSCOPY  02/06/2006   Dr. [name]- single anal papilla, o/w normal rectum, normal colon   COLONOSCOPY N/A 09/13/2016   RMR: three 5-6 mm for adenomas removed.  Recommend one last surveillance colonoscopy in October 2020.   COLONOSCOPY WITH PROPOFOL N/A 01/28/2020   Procedure: COLONOSCOPY WITH PROPOFOL;  Surgeon: [name], MD;  [facility];  Service: Endoscopy;  Laterality: N/A;   10:45am   CYSTOSCOPY WITH RETROGRADE PYELOGRAM, URETEROSCOPY AND STENT PLACEMENT Right 08/18/2020   Procedure: CYSTOSCOPY WITH INCISION OF RIGHT URETEROCELE, URETEROSCOPY WITH HOLMIUM LASER, CYSTOLITHOLAPAXY OF 10 MM BLADDER STONE;  Surgeon: [name], MD;  [facility];  Service: Urology;  Laterality: Right;   ESOPHAGOGASTRODUODENOSCOPY  09/15/2010   Dr. [name]- Salmon-colored epithelium coming up to 37 cm from the    ESOPHAGOGASTRODUODENOSCOPY N/A 07/02/2013   Dr. [name]- Barretts on bx. hiatal hernia   ESOPHAGOGASTRODUODENOSCOPY N/A 09/13/2016   RMR: Barrett's esophagus without dysplasia. one last EGD in 3 years.    ESOPHAGOGASTRODUODENOSCOPY (EGD) WITH PROPOFOL N/A 01/28/2020   Procedure: ESOPHAGOGASTRODUODENOSCOPY (EGD) WITH PROPOFOL;  Surgeon: [name], MD;  [facility];  Service: Endoscopy;  Laterality: N/A;   INTRAVASCULAR PRESSURE WIRE/FFR STUDY N/A 07/21/2019   Procedure: INTRAVASCULAR PRESSURE WIRE/FFR STUDY;  Surgeon: [name], MD;  Location: [place] [facility];  Service: Cardiovascular;  Laterality: N/A;   LEFT HEART CATH AND CORONARY ANGIOGRAPHY N/A 07/21/2019   Procedure: LEFT HEART CATH AND CORONARY ANGIOGRAPHY;  Surgeon: [name], MD;  Location: [place] [facility];  Service: Cardiovascular;  Laterality: N/A;   POLYPECTOMY  01/28/2020   Procedure: POLYPECTOMY;  Surgeon: [name], MD;  [facility];  Service: Endoscopy;;  colon   ROTATOR CUFF REPAIR     YAG LASER APPLICATION Right 04/20/7034   Procedure: YAG LASER APPLICATION;  Surgeon: [name], MD;  Location[facility];  Service: Ophthalmology;  Laterality: Right;   Patient Active Problem List   Diagnosis Date Noted   Subdural hematoma ([place]) 04/01/2022   DOE (dyspnea on exertion) 06/28/2020   Long-term current use of opiate analgesic 02/10/2020   Arthritis of right sacroiliac joint 02/08/2020   Lumbar radiculopathy 02/08/2020   Polyneuropathy due to type 2 diabetes mellitus  ([place]) 02/08/2020   Restless legs 02/08/2020   Chronic low back pain 02/08/2020   Polyarthropathy 02/08/2020   Fatigue 10/19/2019   H/O adenomatous polyp of colon 10/19/2019   Abnormal cardiac CT angiography    Encounter for therapeutic drug monitoring 07/02/2018   Atrial fibrillation ([place]) 06/24/2018   DIZZINESS 05/09/2010   Angina pectoris ([place]) 05/09/2010   GERD 01/31/2009   BARRETTS ESOPHAGUS 01/31/2009    ONSET DATE: 04/02/22  REFERRING DIAG: K09.381W (ICD-10-CM) - Closed chip fracture of left triquetrum with routine healing, subsequent encounter  THERAPY DIAG:  Pain in left hand  Stiffness of left wrist, not elsewhere classified  Other lack of coordination  Other symptoms and signs involving the musculoskeletal system  Rationale for Evaluation and Treatment Rehabilitation  PERTINENT HISTORY: Pt reports a fall on 04/02/22 which resulted in a closed chip fracture of left triquetrum. He was instructed to wear a removable wrist brace for 5 weeks and encouraged to complete A/ROM exercises. He continued to have pain and stiffness, returned to specialist with a negative left wrist x-ray. Dr. Amedeo Kinsman referred pt for an OT evaluation and treatment to address limited ROM and increased pain.   PRECAUTIONS: None  SUBJECTIVE: S: "Every few days my whole hand is numb when I wake up in the morning. It is sore pushing down on it."    PAIN:  Are you having pain? Yes: NPRS scale: 2/10 Pain location: left wrist Pain description: Aching Aggravating factors: certain movements Relieving factors: rest, ice     OBJECTIVE:   HAND DOMINANCE: Right   ADLs: Overall ADLs: Pt is independent in ADLs although has had to make some adaptations as certain activities cause increased pain.  He reports that he is unable to swing a golf club and is unable to pick up his 64-year-old grandson.        FUNCTIONAL OUTCOME MEASURES: Quick Dash: 43.18   UPPER EXTREMITY ROM      Active ROM  Left eval  Wrist flexion 45  Wrist extension 28  Wrist ulnar deviation 12  Wrist radial deviation 8  Wrist pronation 90  Wrist supination 81  (Blank rows = not tested)   Active ROM Left eval  Index MCP (0-90) 72   Index PIP (0-100)  50  Index DIP (0-70)  52  Long MCP (0-90)  78  Long PIP (0-100)  71  Long DIP (0-70)  53  Ring MCP (0-90)  72  Ring PIP (0-100)  71  Ring DIP (0-70)  51  Little MCP (0-90)  70  Little PIP (0-100)  62  Little DIP (0-70)  50  (Blank rows = not tested)     UPPER EXTREMITY MMT:      MMT Left eval  Elbow flexion  4+/5  Elbow extension  4+/5  Wrist flexion  4/5  Wrist extension  4/5  Wrist ulnar deviation  4/5  Wrist radial deviation  4+/5  Wrist pronation 4/5  Wrist supination  4/5  (Blank rows = not tested)   HAND FUNCTION: Grip strength: Right: 72 lbs; Left: 21 lbs, Lateral pinch: Right: 18 lbs, Left: 11 lbs, and 3 point pinch: Right: 14 lbs, Left: 6 lbs   COORDINATION: 9 Hole Peg test: Right: 38 sec; Left: 32 sec   SENSATION: Pt reports decreased sensation in tips of fingers, especially D4/5   EDEMA: Mild swelling throughout LUE   OBSERVATIONS: mild fascial restrictions throughout LUE       TODAY'S TREATMENT:  06/12/22 -Manual Therapy: Trigger point and myofascial release of left forearm flexors and extensors, hand musculature, pectoralis, serratus anterior, upper trapezius, and posterior shoulder -Moist Heat pack applied to left forearm and hand, 10 minutes -P/ROM: 1x10 flexion/extension at each digit joint, blocking. -P/ROM: 1x5 wrist flexion/extension with prolonged stretch at end range  -Weighted Wrist Stretch: 2# wrist flexion and extension, 3x30" each  -Wrist strengthening: 2x10 all wrist movements    06/07/22 -myofascial release of LUE wrist, forearm flexors and extensors, palm region, to decrease pain and fascial restrictions and increase joint ROM -P/ROM: 1x10 flexion/extension at each digit joint,  blocking. -P/ROM: 1x5 wrist flexion/extension with prolonged stretch at end range  -Grip strengthening: Yellow theraputty, 5x5" squeeze   06/05/22 -myofascial release of LUE wrist, forearm flexors and extensors, palm region, to decrease pain and fascial restrictions and increase joint ROM -Wrist strengthening: left wrist flexion, extension, radial deviation, 10 reps each -Forearm strengthening: pronation, supination, 10 reps each -Grip strengthening: all large, medium, and small beads at 25#, vertical gripper -Sponges: Pt picking up high resistance sponges, 7 at a time -Pt holding one high resistance sponges in palm, working on translating from palm to fingertips then making circles in the palm with the sponge   PATIENT EDUCATION: Education details: Wrist strengthening, wrist stretch Person educated: Patient Education method: Consulting civil engineer, Demonstration, and Handouts Education comprehension: verbalized understanding and returned demonstration     HOME EXERCISE PROGRAM: Eval: Tendon glides, Wrist A/ROM 06/07/22: Digit ROM/blocking, Theraputty 7/25: wrist stretch and wrist strengthening   GOALS: Goals reviewed with patient? Yes   SHORT TERM GOALS: Target date: 07/13/22   Pt will be provided with and educated on HEP to improve mobility in LUE required for ADL completion.  Goal status: Ongoing    2.  Pt will decrease pain in LUE to 2/10 or less in order to swing a golf club with less pain.  Goal status: Ongoing   3.  Pt will increase ROM in left digits to improve ability to form a full grasp required for holding items.  Goal status: Ongoing   4.  Pt will increase left grip strength by 15# and pinch strength by 4# to improve ability to grasp and hold pots and pans during meal preparation.  Goal status:Ongoing   5.  Pt will decrease LUE fascial restrictions to trace amounts or less to improve mobility required for fine motor tasks such as opening containers and food packages.  Goal  status: Ongoing        ASSESSMENT:   CLINICAL IMPRESSION: Pt reports that every couple of days he will wake up with a numb left hand and that most of his pain is at the ulnar hand along D5. Trigger point work completed to upper extremity to address potential trigger point contributing to decreased sensation in left hand. While working on the shoulder region, applied moist heat to left forearm and hand to allow for greater passive stretching. Continues  with intrinsic tightness that lessens with passive stretching. Provided pt education on the importance of strengthening despite some discomfort. Pt reports no pain at conclusion of session.       PLAN: OT FREQUENCY: 2x/week   OT DURATION: 6 weeks   PLANNED INTERVENTIONS: self care/ADL training, therapeutic exercise, therapeutic activity, manual therapy, passive range of motion, electrical stimulation, ultrasound, paraffin, moist heat, cryotherapy, energy conservation, and DME and/or AE instructions     CONSULTED AND AGREED WITH PLAN OF CARE: Patient   PLAN FOR NEXT SESSION:  PRINT HEP for blocking and theraputty, continue with grip strengthening, add pinch task, coordination targeting digit mobility       Flonnie Hailstone, [place], OTR/L [phone] 06/12/2022, 3:52 PM

## 2022-06-14 ENCOUNTER — Encounter (HOSPITAL_COMMUNITY): Payer: Self-pay

## 2022-06-14 ENCOUNTER — Ambulatory Visit (HOSPITAL_COMMUNITY): Payer: Medicare Other

## 2022-06-14 DIAGNOSIS — M79642 Pain in left hand: Secondary | ICD-10-CM | POA: Diagnosis not present

## 2022-06-14 DIAGNOSIS — R29898 Other symptoms and signs involving the musculoskeletal system: Secondary | ICD-10-CM | POA: Diagnosis not present

## 2022-06-14 DIAGNOSIS — M25632 Stiffness of left wrist, not elsewhere classified: Secondary | ICD-10-CM | POA: Diagnosis not present

## 2022-06-14 DIAGNOSIS — R278 Other lack of coordination: Secondary | ICD-10-CM

## 2022-06-14 NOTE — Therapy (Signed)
OUTPATIENT OCCUPATIONAL THERAPY TREATMENT NOTE   Patient Name: HERSEL [name] MRN: [id number] DOB:1941-10-15, 81 y.o., male Today's Date: 06/14/2022  PCP: Redmond School, MD REFERRING PROVIDER: Larena Glassman, MD  END OF SESSION:   OT End of Session - 06/14/22 1428     Visit Number 5    Number of Visits 12    Date for OT Re-Evaluation 07/13/22    Authorization Type 2)Medicare Part A and B   2)BCBS Supplement    OT Start Time 1430    OT Stop Time 1513    OT Time Calculation (min) 43 min    Activity Tolerance Patient tolerated treatment well    Behavior During Therapy [facility] for tasks assessed/performed              Past Medical History:  Diagnosis Date   Aortic atherosclerosis ([place])    Barrett's esophagus    Short-segment, diagnosed in 2007 by Dr. [name]   BPH (benign prostatic hyperplasia)    CAD (coronary artery disease)    a. cath on 07/21/2019 showing patent LM and LCx with 40-60% Proximal LAD and 30-40% Pro-RCA stenosis with normal LV function.   Essential hypertension    GERD (gastroesophageal reflux disease)    Hiatal hernia    History of kidney stones    PAF (paroxysmal atrial fibrillation) ([place])    S/P colonoscopy 2007   S/P endoscopy 08/2010    Salmon-colored epithelium coming up to 37 cm from the    Type 2 diabetes mellitus ([place])    Vertigo    Past Surgical History:  Procedure Laterality Date   APPENDECTOMY     BIOPSY  01/28/2020   Procedure: BIOPSY;  Surgeon: [name], MD;  [facility];  Service: Endoscopy;;  esophagus   CHOLECYSTECTOMY     COLONOSCOPY  02/06/2006   Dr. [name]- single anal papilla, o/w normal rectum, normal colon   COLONOSCOPY N/A 09/13/2016   RMR: three 5-6 mm for adenomas removed.  Recommend one last surveillance colonoscopy in October 2020.   COLONOSCOPY WITH PROPOFOL N/A 01/28/2020   Procedure: COLONOSCOPY WITH PROPOFOL;  Surgeon: [name], MD;  [facility];  Service: Endoscopy;  Laterality: N/A;   10:45am   CYSTOSCOPY WITH RETROGRADE PYELOGRAM, URETEROSCOPY AND STENT PLACEMENT Right 08/18/2020   Procedure: CYSTOSCOPY WITH INCISION OF RIGHT URETEROCELE, URETEROSCOPY WITH HOLMIUM LASER, CYSTOLITHOLAPAXY OF 10 MM BLADDER STONE;  Surgeon: [name], MD;  [facility];  Service: Urology;  Laterality: Right;   ESOPHAGOGASTRODUODENOSCOPY  09/15/2010   Dr. [name]- Salmon-colored epithelium coming up to 37 cm from the    ESOPHAGOGASTRODUODENOSCOPY N/A 07/02/2013   Dr. [name]- Barretts on bx. hiatal hernia   ESOPHAGOGASTRODUODENOSCOPY N/A 09/13/2016   RMR: Barrett's esophagus without dysplasia. one last EGD in 3 years.    ESOPHAGOGASTRODUODENOSCOPY (EGD) WITH PROPOFOL N/A 01/28/2020   Procedure: ESOPHAGOGASTRODUODENOSCOPY (EGD) WITH PROPOFOL;  Surgeon: [name], MD;  [facility];  Service: Endoscopy;  Laterality: N/A;   INTRAVASCULAR PRESSURE WIRE/FFR STUDY N/A 07/21/2019   Procedure: INTRAVASCULAR PRESSURE WIRE/FFR STUDY;  Surgeon: [name], MD;  Location: [place] [facility];  Service: Cardiovascular;  Laterality: N/A;   LEFT HEART CATH AND CORONARY ANGIOGRAPHY N/A 07/21/2019   Procedure: LEFT HEART CATH AND CORONARY ANGIOGRAPHY;  Surgeon: [name], MD;  Location: [place] [facility];  Service: Cardiovascular;  Laterality: N/A;   POLYPECTOMY  01/28/2020   Procedure: POLYPECTOMY;  Surgeon: [name], MD;  [facility];  Service: Endoscopy;;  colon   ROTATOR CUFF REPAIR     YAG LASER APPLICATION Right 07/25/2228   Procedure: YAG LASER APPLICATION;  Surgeon: [name], MD;  [facility]: [facility];  Service: Ophthalmology;  Laterality: Right;   Patient Active Problem List   Diagnosis Date Noted   Subdural hematoma ([place]) 04/01/2022   DOE (dyspnea on exertion) 06/28/2020   Long-term current use of opiate analgesic 02/10/2020   Arthritis of right sacroiliac joint 02/08/2020   Lumbar radiculopathy 02/08/2020   Polyneuropathy due to type 2 diabetes mellitus  ([place]) 02/08/2020   Restless legs 02/08/2020   Chronic low back pain 02/08/2020   Polyarthropathy 02/08/2020   Fatigue 10/19/2019   H/O adenomatous polyp of colon 10/19/2019   Abnormal cardiac CT angiography    Encounter for therapeutic drug monitoring 07/02/2018   Atrial fibrillation ([place]) 06/24/2018   DIZZINESS 05/09/2010   Angina pectoris ([place]) 05/09/2010   GERD 01/31/2009   BARRETTS ESOPHAGUS 01/31/2009    ONSET DATE: 04/02/22  REFERRING DIAG: N98.921J (ICD-10-CM) - Closed chip fracture of left triquetrum with routine healing, subsequent encounter  THERAPY DIAG:  Pain in left hand  Stiffness of left wrist, not elsewhere classified  Other lack of coordination  Other symptoms and signs involving the musculoskeletal system  Rationale for Evaluation and Treatment Rehabilitation  PERTINENT HISTORY: Pt reports a fall on 04/02/22 which resulted in a closed chip fracture of left triquetrum. He was instructed to wear a removable wrist brace for 5 weeks and encouraged to complete A/ROM exercises. He continued to have pain and stiffness, returned to specialist with a negative left wrist x-ray. Dr. Amedeo Kinsman referred pt for an OT evaluation and treatment to address limited ROM and increased pain.   PRECAUTIONS: None  SUBJECTIVE: S: "I've been doing my exercises. Still just aches though."     PAIN:  Are you having pain? Yes: NPRS scale: 2/10 Pain location: left wrist Pain description: Aching Aggravating factors: certain movements Relieving factors: rest, ice     OBJECTIVE:   HAND DOMINANCE: Right   ADLs: Overall ADLs: Pt is independent in ADLs although has had to make some adaptations as certain activities cause increased pain.  He reports that he is unable to swing a golf club and is unable to pick up his 46-year-old grandson.        FUNCTIONAL OUTCOME MEASURES: Quick Dash: 43.18   UPPER EXTREMITY ROM      Active ROM Left eval  Wrist flexion 45  Wrist extension 28   Wrist ulnar deviation 12  Wrist radial deviation 8  Wrist pronation 90  Wrist supination 81  (Blank rows = not tested)   Active ROM Left eval  Index MCP (0-90) 72   Index PIP (0-100)  50  Index DIP (0-70)  52  Long MCP (0-90)  78  Long PIP (0-100)  71  Long DIP (0-70)  53  Ring MCP (0-90)  72  Ring PIP (0-100)  71  Ring DIP (0-70)  51  Little MCP (0-90)  70  Little PIP (0-100)  62  Little DIP (0-70)  50  (Blank rows = not tested)     UPPER EXTREMITY MMT:      MMT Left eval  Elbow flexion  4+/5  Elbow extension  4+/5  Wrist flexion  4/5  Wrist extension  4/5  Wrist ulnar deviation  4/5  Wrist radial deviation  4+/5  Wrist pronation 4/5  Wrist supination  4/5  (Blank  rows = not tested)   HAND FUNCTION: Grip strength: Right: 72 lbs; Left: 21 lbs, Lateral pinch: Right: 18 lbs, Left: 11 lbs, and 3 point pinch: Right: 14 lbs, Left: 6 lbs   COORDINATION: 9 Hole Peg test: Right: 38 sec; Left: 32 sec   SENSATION: Pt reports decreased sensation in tips of fingers, especially D4/5   EDEMA: Mild swelling throughout LUE   OBSERVATIONS: mild fascial restrictions throughout LUE       TODAY'S TREATMENT:  06/14/22 -Manual Therapy: Myofascial release and soft tissue mobilization of left forearm flexors and extensors and hand musculature to decrease pain and fascial restrictions and increase joint ROM. -Moist heat applied to left hand, 10 minutes while completing manual techniques at forearms  -P/ROM: 1x10 flexion/extension at each digit joint, blocking. 1x10 composite fist stretch  -P/ROM: 1x5 wrist flexion/extension with prolonged stretch at end range  -Grip strengthening: 1x10 wrist neutral, pronation, supination, 29# -Palm-finger-palm translation x15 with medium bead   06/12/22 -Manual Therapy: Trigger point and myofascial release of left forearm flexors and extensors, hand musculature, pectoralis, serratus anterior, upper trapezius, and posterior shoulder -Moist  Heat pack applied to left forearm and hand, 10 minutes -P/ROM: 1x10 flexion/extension at each digit joint, blocking. -P/ROM: 1x5 wrist flexion/extension with prolonged stretch at end range  -Weighted Wrist Stretch: 2# wrist flexion and extension, 3x30" each  -Wrist strengthening: 2x10 all wrist movements    06/07/22 -myofascial release of LUE wrist, forearm flexors and extensors, palm region, to decrease pain and fascial restrictions and increase joint ROM -P/ROM: 1x10 flexion/extension at each digit joint, blocking. -P/ROM: 1x5 wrist flexion/extension with prolonged stretch at end range  -Grip strengthening: Yellow theraputty, 5x5" squeeze    PATIENT EDUCATION: Education details: Wrist strengthening, wrist stretch Person educated: Patient Education method: Explanation, Demonstration, and Handouts Education comprehension: verbalized understanding and returned demonstration     HOME EXERCISE PROGRAM: Eval: Tendon glides, Wrist A/ROM 06/07/22: Digit ROM/blocking, Theraputty 7/25: wrist stretch and wrist strengthening   GOALS: Goals reviewed with patient? Yes   SHORT TERM GOALS: Target date: 07/13/22   Pt will be provided with and educated on HEP to improve mobility in LUE required for ADL completion.  Goal status: Ongoing    2.  Pt will decrease pain in LUE to 2/10 or less in order to swing a golf club with less pain.  Goal status: Ongoing   3.  Pt will increase ROM in left digits to improve ability to form a full grasp required for holding items.  Goal status: Ongoing   4.  Pt will increase left grip strength by 15# and pinch strength by 4# to improve ability to grasp and hold pots and pans during meal preparation.  Goal status:Ongoing   5.  Pt will decrease LUE fascial restrictions to trace amounts or less to improve mobility required for fine motor tasks such as opening containers and food packages.  Goal status: Ongoing        ASSESSMENT:   CLINICAL IMPRESSION: Pt  continues to report general hand stiffness as his primary complaint, no numbness since last session. Applied moist heat pack prior to manual and passive stretch at hand to increase available range of motion. Pt reporting less "tightness" following, able to form a more complete composite fist, however, intrinsic tightness still present. Able to tolerate grip strengthening with no increased pain. Improved coordination noted with progressive repetitions of translation. Therapist cuing for digit musculature isolation.       PLAN: OT FREQUENCY: 2x/week  OT DURATION: 6 weeks   PLANNED INTERVENTIONS: self care/ADL training, therapeutic exercise, therapeutic activity, manual therapy, passive range of motion, electrical stimulation, ultrasound, paraffin, moist heat, cryotherapy, energy conservation, and DME and/or AE instructions     CONSULTED AND AGREED WITH PLAN OF CARE: Patient   PLAN FOR NEXT SESSION:  PRINT HEP for blocking and theraputty, continue with grip strengthening, add pinch task, coordination targeting digit mobility       Flonnie Hailstone, [place], OTR/L [phone] 06/14/2022, 3:34 PM

## 2022-06-19 ENCOUNTER — Encounter (HOSPITAL_COMMUNITY): Payer: Self-pay

## 2022-06-19 ENCOUNTER — Ambulatory Visit (HOSPITAL_COMMUNITY): Payer: Medicare Other | Attending: Orthopedic Surgery

## 2022-06-19 DIAGNOSIS — M79642 Pain in left hand: Secondary | ICD-10-CM | POA: Insufficient documentation

## 2022-06-19 DIAGNOSIS — R278 Other lack of coordination: Secondary | ICD-10-CM | POA: Diagnosis not present

## 2022-06-19 DIAGNOSIS — R29898 Other symptoms and signs involving the musculoskeletal system: Secondary | ICD-10-CM | POA: Diagnosis not present

## 2022-06-19 DIAGNOSIS — M25632 Stiffness of left wrist, not elsewhere classified: Secondary | ICD-10-CM | POA: Diagnosis not present

## 2022-06-19 NOTE — Therapy (Signed)
OUTPATIENT OCCUPATIONAL THERAPY TREATMENT NOTE   Patient Name: [name] MRN: [id number] DOB:Apr 25, 1941, 81 y.o., male Today's Date: 06/19/2022  PCP: Redmond School, MD REFERRING PROVIDER: Larena Glassman, MD  END OF SESSION:   OT End of Session - 06/19/22 1656     Visit Number 6    Number of Visits 12    Date for OT Re-Evaluation 07/13/22    Authorization Type 2)Medicare Part A and B   2)BCBS Supplement    OT Start Time 1600    OT Stop Time 1643    OT Time Calculation ([name]) 43 [name]    Activity Tolerance Patient tolerated treatment well    Behavior During Therapy [facility] for tasks assessed/performed               Past Medical History:  Diagnosis Date   Aortic atherosclerosis ([place])    Barrett's esophagus    Short-segment, diagnosed in 2007 by Dr. [name]   BPH (benign prostatic hyperplasia)    CAD (coronary artery disease)    a. cath on 07/21/2019 showing patent LM and LCx with 40-60% Proximal LAD and 30-40% Pro-RCA stenosis with normal LV function.   Essential hypertension    GERD (gastroesophageal reflux disease)    Hiatal hernia    History of kidney stones    PAF (paroxysmal atrial fibrillation) ([place])    S/P colonoscopy 2007   S/P endoscopy 08/2010    Salmon-colored epithelium coming up to 37 cm from the    Type 2 diabetes mellitus ([place])    Vertigo    Past Surgical History:  Procedure Laterality Date   APPENDECTOMY     BIOPSY  01/28/2020   Procedure: BIOPSY;  Surgeon: [name], MD;  [facility];  Service: Endoscopy;;  esophagus   CHOLECYSTECTOMY     COLONOSCOPY  02/06/2006   Dr. [name]- single anal papilla, o/w normal rectum, normal colon   COLONOSCOPY N/A 09/13/2016   RMR: three 5-6 mm for adenomas removed.  Recommend one last surveillance colonoscopy in October 2020.   COLONOSCOPY WITH PROPOFOL N/A 01/28/2020   Procedure: COLONOSCOPY WITH PROPOFOL;  Surgeon: [name], MD;  [facility];  Service: Endoscopy;  Laterality: N/A;   10:45am   CYSTOSCOPY WITH RETROGRADE PYELOGRAM, URETEROSCOPY AND STENT PLACEMENT Right 08/18/2020   Procedure: CYSTOSCOPY WITH INCISION OF RIGHT URETEROCELE, URETEROSCOPY WITH HOLMIUM LASER, CYSTOLITHOLAPAXY OF 10 MM BLADDER STONE;  Surgeon: [name], MD;  [facility];  Service: Urology;  Laterality: Right;   ESOPHAGOGASTRODUODENOSCOPY  09/15/2010   Dr. [name]- Salmon-colored epithelium coming up to 37 cm from the    ESOPHAGOGASTRODUODENOSCOPY N/A 07/02/2013   Dr. [name]- Barretts on bx. hiatal hernia   ESOPHAGOGASTRODUODENOSCOPY N/A 09/13/2016   RMR: Barrett's esophagus without dysplasia. one last EGD in 3 years.    ESOPHAGOGASTRODUODENOSCOPY (EGD) WITH PROPOFOL N/A 01/28/2020   Procedure: ESOPHAGOGASTRODUODENOSCOPY (EGD) WITH PROPOFOL;  Surgeon: [name], MD;  [facility];  Service: Endoscopy;  Laterality: N/A;   INTRAVASCULAR PRESSURE WIRE/FFR STUDY N/A 07/21/2019   Procedure: INTRAVASCULAR PRESSURE WIRE/FFR STUDY;  Surgeon: [name], MD;  Location: [place] [facility];  Service: Cardiovascular;  Laterality: N/A;   LEFT HEART CATH AND CORONARY ANGIOGRAPHY N/A 07/21/2019   Procedure: LEFT HEART CATH AND CORONARY ANGIOGRAPHY;  Surgeon: [name], MD;  Location: [place] [facility];  Service: Cardiovascular;  Laterality: N/A;   POLYPECTOMY  01/28/2020   Procedure: POLYPECTOMY;  Surgeon: [name], MD;  [facility];  Service: Endoscopy;;  colon   ROTATOR CUFF REPAIR     YAG LASER APPLICATION Right 06/20/9936   Procedure: YAG LASER APPLICATION;  Surgeon: [name], MD;  [facility];  Service: Ophthalmology;  Laterality: Right;   Patient Active Problem List   Diagnosis Date Noted   Subdural hematoma ([place]) 04/01/2022   DOE (dyspnea on exertion) 06/28/2020   Long-term current use of opiate analgesic 02/10/2020   Arthritis of right sacroiliac joint 02/08/2020   Lumbar radiculopathy 02/08/2020   Polyneuropathy due to type 2 diabetes mellitus  ([place]) 02/08/2020   Restless legs 02/08/2020   Chronic low back pain 02/08/2020   Polyarthropathy 02/08/2020   Fatigue 10/19/2019   H/O adenomatous polyp of colon 10/19/2019   Abnormal cardiac CT angiography    Encounter for therapeutic drug monitoring 07/02/2018   Atrial fibrillation ([place]) 06/24/2018   DIZZINESS 05/09/2010   Angina pectoris ([place]) 05/09/2010   GERD 01/31/2009   BARRETTS ESOPHAGUS 01/31/2009    ONSET DATE: 04/02/22  REFERRING DIAG: J69.678L (ICD-10-CM) - Closed chip fracture of left triquetrum with routine healing, subsequent encounter  THERAPY DIAG:  Pain in left hand  Stiffness of left wrist, not elsewhere classified  Other lack of coordination  Other symptoms and signs involving the musculoskeletal system  Rationale for Evaluation and Treatment Rehabilitation  PERTINENT HISTORY: Pt reports a fall on 04/02/22 which resulted in a closed chip fracture of left triquetrum. He was instructed to wear a removable wrist brace for 5 weeks and encouraged to complete A/ROM exercises. He continued to have pain and stiffness, returned to specialist with a negative left wrist x-ray. Dr. Amedeo Kinsman referred pt for an OT evaluation and treatment to address limited ROM and increased pain.   PRECAUTIONS: None  SUBJECTIVE: S: "I understand that it takes time and it is getting better I just don't have strength. I was able to use small tools to put together some chairs today and wasn't dropping anything."     PAIN:  Are you having pain? Yes: NPRS scale: 3/10 Pain location: left wrist Pain description: Aching Aggravating factors: certain movements Relieving factors: rest, ice     OBJECTIVE:   HAND DOMINANCE: Right   ADLs: Overall ADLs: Pt is independent in ADLs although has had to make some adaptations as certain activities cause increased pain.  He reports that he is unable to swing a golf club and is unable to pick up his 76-year-old grandson.        FUNCTIONAL OUTCOME  MEASURES: Quick Dash: 43.18   UPPER EXTREMITY ROM      Active ROM Left eval  Wrist flexion 45  Wrist extension 28  Wrist ulnar deviation 12  Wrist radial deviation 8  Wrist pronation 90  Wrist supination 81  (Blank rows = not tested)   Active ROM Left eval  Index MCP (0-90) 72   Index PIP (0-100)  50  Index DIP (0-70)  52  Long MCP (0-90)  78  Long PIP (0-100)  71  Long DIP (0-70)  53  Ring MCP (0-90)  72  Ring PIP (0-100)  71  Ring DIP (0-70)  51  Little MCP (0-90)  70  Little PIP (0-100)  62  Little DIP (0-70)  50  (Blank rows = not tested)     UPPER EXTREMITY MMT:      MMT Left eval  Elbow flexion  4+/5  Elbow extension  4+/5  Wrist flexion  4/5  Wrist extension  4/5  Wrist ulnar deviation  4/5  Wrist radial deviation  4+/5  Wrist pronation 4/5  Wrist supination  4/5  (Blank rows = not tested)   HAND FUNCTION: Grip strength: Right: 72 lbs; Left: 21 lbs, Lateral pinch: Right: 18 lbs, Left: 11 lbs, and 3 point pinch: Right: 14 lbs, Left: 6 lbs 8/1: 42#   COORDINATION: 9 Hole Peg test: Right: 38 sec; Left: 32 sec   SENSATION: Pt reports decreased sensation in tips of fingers, especially D4/5   EDEMA: Mild swelling throughout LUE   OBSERVATIONS: mild fascial restrictions throughout LUE       TODAY'S TREATMENT:  06/19/22 -Paraffin Wax, 10 minutes to decrease muscle restrictions and pain and allow for increased range of motion   -Manual Therapy: Myofascial release and soft tissue mobilization of left forearm flexors and extensors and hand musculature to decrease pain and fascial restrictions and increase joint ROM. -Pinch Strength: D1/4/5 to place and retrieve yellow and red pins, D1/2/3 to place and remove blue and green pins -Grip strengthening: 2x15 wrist neutral 23#  -P/ROM: 1x10 flexion/extension at each digit joint, blocking. 1x10 composite fist stretch  -P/ROM: 1x5 wrist flexion/extension with prolonged stretch at end range     06/14/22 -Manual Therapy: Myofascial release and soft tissue mobilization of left forearm flexors and extensors and hand musculature to decrease pain and fascial restrictions and increase joint ROM. -Moist heat applied to left hand, 10 minutes while completing manual techniques at forearms  -P/ROM: 1x10 flexion/extension at each digit joint, blocking. 1x10 composite fist stretch  -P/ROM: 1x5 wrist flexion/extension with prolonged stretch at end range  -Grip strengthening: 1x10 wrist neutral, pronation, supination, 29# -Palm-finger-palm translation x15 with medium bead   06/12/22 -Manual Therapy: Trigger point and myofascial release of left forearm flexors and extensors, hand musculature, pectoralis, serratus anterior, upper trapezius, and posterior shoulder -Moist Heat pack applied to left forearm and hand, 10 minutes -P/ROM: 1x10 flexion/extension at each digit joint, blocking. -P/ROM: 1x5 wrist flexion/extension with prolonged stretch at end range  -Weighted Wrist Stretch: 2# wrist flexion and extension, 3x30" each  -Wrist strengthening: 2x10 all wrist movements     PATIENT EDUCATION: Education details: Heat Person educated: Patient Education method: Explanation, Demonstration, and Handouts Education comprehension: verbalized understanding and returned demonstration     HOME EXERCISE PROGRAM: Eval: Tendon glides, Wrist A/ROM 06/07/22: Digit ROM/blocking, Theraputty 7/25: wrist stretch and wrist strengthening  8/1: Heat   GOALS: Goals reviewed with patient? Yes   SHORT TERM GOALS: Target date: 07/13/22   Pt will be provided with and educated on HEP to improve mobility in LUE required for ADL completion.  Goal status: Ongoing    2.  Pt will decrease pain in LUE to 2/10 or less in order to swing a golf club with less pain.  Goal status: Ongoing   3.  Pt will increase ROM in left digits to improve ability to form a full grasp required for holding items.  Goal status:  Ongoing   4.  Pt will increase left grip strength by 15# and pinch strength by 4# to improve ability to grasp and hold pots and pans during meal preparation.  Goal status:Ongoing   5.  Pt will decrease LUE fascial restrictions to trace amounts or less to improve mobility required for fine motor tasks such as opening containers and food packages.  Goal status: Ongoing        ASSESSMENT:   CLINICAL IMPRESSION: Pt reports that he has noticed some decreased pain  and increased range of motion through his left wrist/hand. His primary complaint at this time is pain at D5 and decreased grip strength. Trigger point at triceps to address D5 pain with pt noting some relief. Measured left grip strength today, 42#, which is significantly improved from evaluation. Continued with grip and pinch strength, with pt reporting that his hand felt "less tight" following although some increased pain at D5. Continues to benefit from passive stretching to digits with composite fist, able to achieve tighter fist following.    PLAN: OT FREQUENCY: 2x/week   OT DURATION: 6 weeks   PLANNED INTERVENTIONS: self care/ADL training, therapeutic exercise, therapeutic activity, manual therapy, passive range of motion, electrical stimulation, ultrasound, paraffin, moist heat, cryotherapy, energy conservation, and DME and/or AE instructions     CONSULTED AND AGREED WITH PLAN OF CARE: Patient   PLAN FOR NEXT SESSION:  PRINT HEP for blocking and theraputty, continue with grip strengthening, add pinch task, coordination targeting digit mobility. Paraffin      Mathews Robinsons, OTR/L [phone] 06/19/2022, 5:11 PM

## 2022-06-21 ENCOUNTER — Ambulatory Visit (HOSPITAL_COMMUNITY): Payer: Medicare Other

## 2022-06-21 ENCOUNTER — Encounter (HOSPITAL_COMMUNITY): Payer: Self-pay

## 2022-06-21 DIAGNOSIS — R278 Other lack of coordination: Secondary | ICD-10-CM

## 2022-06-21 DIAGNOSIS — R29898 Other symptoms and signs involving the musculoskeletal system: Secondary | ICD-10-CM | POA: Diagnosis not present

## 2022-06-21 DIAGNOSIS — M25632 Stiffness of left wrist, not elsewhere classified: Secondary | ICD-10-CM

## 2022-06-21 DIAGNOSIS — M79642 Pain in left hand: Secondary | ICD-10-CM | POA: Diagnosis not present

## 2022-06-21 NOTE — Therapy (Signed)
OUTPATIENT OCCUPATIONAL THERAPY TREATMENT NOTE   Patient Name: [name] MRN: [id number] DOB:11/30/1940, 81 y.o., male Today's Date: 06/21/2022  PCP: Redmond School, MD REFERRING PROVIDER: Larena Glassman, MD  END OF SESSION:   OT End of Session - 06/21/22 1547     Visit Number 7    Number of Visits 12    Date for OT Re-Evaluation 07/13/22    Authorization Type 2)Medicare Part A and B   2)BCBS Supplement    OT Start Time 1347    OT Stop Time 1431    OT Time Calculation (min) 44 min    Activity Tolerance Patient tolerated treatment well    Behavior During Therapy [facility] for tasks assessed/performed                Past Medical History:  Diagnosis Date   Aortic atherosclerosis ([place])    Barrett's esophagus    Short-segment, diagnosed in 2007 by Dr. [name]   BPH (benign prostatic hyperplasia)    CAD (coronary artery disease)    a. cath on 07/21/2019 showing patent LM and LCx with 40-60% Proximal LAD and 30-40% Pro-RCA stenosis with normal LV function.   Essential hypertension    GERD (gastroesophageal reflux disease)    Hiatal hernia    History of kidney stones    PAF (paroxysmal atrial fibrillation) ([place])    S/P colonoscopy 2007   S/P endoscopy 08/2010    Salmon-colored epithelium coming up to 37 cm from the    Type 2 diabetes mellitus ([place])    Vertigo    Past Surgical History:  Procedure Laterality Date   APPENDECTOMY     BIOPSY  01/28/2020   Procedure: BIOPSY;  Surgeon: [name], MD;  [facility];  Service: Endoscopy;;  esophagus   CHOLECYSTECTOMY     COLONOSCOPY  02/06/2006   Dr. [name]- single anal papilla, o/w normal rectum, normal colon   COLONOSCOPY N/A 09/13/2016   RMR: three 5-6 mm for adenomas removed.  Recommend one last surveillance colonoscopy in October 2020.   COLONOSCOPY WITH PROPOFOL N/A 01/28/2020   Procedure: COLONOSCOPY WITH PROPOFOL;  Surgeon: [name], MD;  [facility];  Service: Endoscopy;  Laterality: N/A;   10:45am   CYSTOSCOPY WITH RETROGRADE PYELOGRAM, URETEROSCOPY AND STENT PLACEMENT Right 08/18/2020   Procedure: CYSTOSCOPY WITH INCISION OF RIGHT URETEROCELE, URETEROSCOPY WITH HOLMIUM LASER, CYSTOLITHOLAPAXY OF 10 MM BLADDER STONE;  Surgeon: [name], MD;  [facility];  Service: Urology;  Laterality: Right;   ESOPHAGOGASTRODUODENOSCOPY  09/15/2010   Dr. [name]- Salmon-colored epithelium coming up to 37 cm from the    ESOPHAGOGASTRODUODENOSCOPY N/A 07/02/2013   Dr. [name]- Barretts on bx. hiatal hernia   ESOPHAGOGASTRODUODENOSCOPY N/A 09/13/2016   RMR: Barrett's esophagus without dysplasia. one last EGD in 3 years.    ESOPHAGOGASTRODUODENOSCOPY (EGD) WITH PROPOFOL N/A 01/28/2020   Procedure: ESOPHAGOGASTRODUODENOSCOPY (EGD) WITH PROPOFOL;  Surgeon: [name], MD;  [facility];  Service: Endoscopy;  Laterality: N/A;   INTRAVASCULAR PRESSURE WIRE/FFR STUDY N/A 07/21/2019   Procedure: INTRAVASCULAR PRESSURE WIRE/FFR STUDY;  Surgeon: [name], MD;  Location: [place] [facility];  Service: Cardiovascular;  Laterality: N/A;   LEFT HEART CATH AND CORONARY ANGIOGRAPHY N/A 07/21/2019   Procedure: LEFT HEART CATH AND CORONARY ANGIOGRAPHY;  Surgeon: [name], MD;  Location: [place] [facility];  Service: Cardiovascular;  Laterality: N/A;   POLYPECTOMY  01/28/2020   Procedure: POLYPECTOMY;  Surgeon: [name], MD;  [facility];  Service: Endoscopy;;  colon   ROTATOR CUFF REPAIR     YAG LASER APPLICATION Right 07/27/9891   Procedure: YAG LASER APPLICATION;  Surgeon: [name], MD;  [facility]: [facility];  Service: Ophthalmology;  Laterality: Right;   Patient Active Problem List   Diagnosis Date Noted   Subdural hematoma ([place]) 04/01/2022   DOE (dyspnea on exertion) 06/28/2020   Long-term current use of opiate analgesic 02/10/2020   Arthritis of right sacroiliac joint 02/08/2020   Lumbar radiculopathy 02/08/2020   Polyneuropathy due to type 2 diabetes mellitus  ([place]) 02/08/2020   Restless legs 02/08/2020   Chronic low back pain 02/08/2020   Polyarthropathy 02/08/2020   Fatigue 10/19/2019   H/O adenomatous polyp of colon 10/19/2019   Abnormal cardiac CT angiography    Encounter for therapeutic drug monitoring 07/02/2018   Atrial fibrillation ([place]) 06/24/2018   DIZZINESS 05/09/2010   Angina pectoris ([place]) 05/09/2010   GERD 01/31/2009   BARRETTS ESOPHAGUS 01/31/2009    ONSET DATE: 04/02/22  REFERRING DIAG: J19.417E (ICD-10-CM) - Closed chip fracture of left triquetrum with routine healing, subsequent encounter  THERAPY DIAG:  Pain in left hand  Stiffness of left wrist, not elsewhere classified  Other lack of coordination  Other symptoms and signs involving the musculoskeletal system  Rationale for Evaluation and Treatment Rehabilitation  PERTINENT HISTORY: Pt reports a fall on 04/02/22 which resulted in a closed chip fracture of left triquetrum. He was instructed to wear a removable wrist brace for 5 weeks and encouraged to complete A/ROM exercises. He continued to have pain and stiffness, returned to specialist with a negative left wrist x-ray. Dr. Amedeo Kinsman referred pt for an OT evaluation and treatment to address limited ROM and increased pain.   PRECAUTIONS: None  SUBJECTIVE: S: "It's hurting today and real bad last night. Throbbing like a toothache."     PAIN:  Are you having pain? Yes: NPRS scale: 4/10 Pain location: left wrist Pain description: Aching Aggravating factors: certain movements Relieving factors: rest, ice     OBJECTIVE:   HAND DOMINANCE: Right   ADLs: Overall ADLs: Pt is independent in ADLs although has had to make some adaptations as certain activities cause increased pain.  He reports that he is unable to swing a golf club and is unable to pick up his 45-year-old grandson.        FUNCTIONAL OUTCOME MEASURES: Quick Dash: 43.18   UPPER EXTREMITY ROM      Active ROM Left eval  Wrist flexion 45   Wrist extension 28  Wrist ulnar deviation 12  Wrist radial deviation 8  Wrist pronation 90  Wrist supination 81  (Blank rows = not tested)   Active ROM Left eval  Index MCP (0-90) 72   Index PIP (0-100)  50  Index DIP (0-70)  52  Long MCP (0-90)  78  Long PIP (0-100)  71  Long DIP (0-70)  53  Ring MCP (0-90)  72  Ring PIP (0-100)  71  Ring DIP (0-70)  51  Little MCP (0-90)  70  Little PIP (0-100)  62  Little DIP (0-70)  50  (Blank rows = not tested)     UPPER EXTREMITY MMT:      MMT Left eval  Elbow flexion  4+/5  Elbow extension  4+/5  Wrist flexion  4/5  Wrist extension  4/5  Wrist ulnar deviation  4/5  Wrist radial deviation  4+/5  Wrist pronation 4/5  Wrist  supination  4/5  (Blank rows = not tested)   HAND FUNCTION: Grip strength: Right: 72 lbs; Left: 21 lbs, Lateral pinch: Right: 18 lbs, Left: 11 lbs, and 3 point pinch: Right: 14 lbs, Left: 6 lbs 8/1: 42#   COORDINATION: 9 Hole Peg test: Right: 38 sec; Left: 32 sec   SENSATION: Pt reports decreased sensation in tips of fingers, especially D4/5   EDEMA: Mild swelling throughout LUE   OBSERVATIONS: mild fascial restrictions throughout LUE       TODAY'S TREATMENT:  06/21/22 -Paraffin Wax, 10 minutes to decrease muscle restrictions and pain and allow for increased range of motion   -Grip Strength: 23# hand gripper to place and retrieve 20 pegs on ped board.  -Manual Therapy: Myofascial release and soft tissue mobilization of left forearm flexors and extensors and hand musculature to decrease pain and fascial restrictions and increase joint ROM. -P/ROM: 1x10 flexion/extension at each digit joint, blocking. 1x10 composite fist stretch  -P/ROM: 1x5 wrist flexion/extension with prolonged stretch at end range  -Wrist Strengthening: 1x10 pronation/supination with velcro board   06/19/22 -Paraffin Wax, 10 minutes to decrease muscle restrictions and pain and allow for increased range of motion   -Manual  Therapy: Myofascial release and soft tissue mobilization of left forearm flexors and extensors and hand musculature to decrease pain and fascial restrictions and increase joint ROM. -Pinch Strength: D1/4/5 to place and retrieve yellow and red pins, D1/2/3 to place and remove blue and green pins -Grip strengthening: 2x15 wrist neutral 23#  -P/ROM: 1x10 flexion/extension at each digit joint, blocking. 1x10 composite fist stretch  -P/ROM: 1x5 wrist flexion/extension with prolonged stretch at end range    06/14/22 -Manual Therapy: Myofascial release and soft tissue mobilization of left forearm flexors and extensors and hand musculature to decrease pain and fascial restrictions and increase joint ROM. -Moist heat applied to left hand, 10 minutes while completing manual techniques at forearms  -P/ROM: 1x10 flexion/extension at each digit joint, blocking. 1x10 composite fist stretch  -P/ROM: 1x5 wrist flexion/extension with prolonged stretch at end range  -Grip strengthening: 1x10 wrist neutral, pronation, supination, 29# -Palm-finger-palm translation x15 with medium bead    PATIENT EDUCATION: Education details: Heat Person educated: Patient Education method: Consulting civil engineer, Demonstration, and Handouts Education comprehension: verbalized understanding and returned demonstration     HOME EXERCISE PROGRAM: Eval: Tendon glides, Wrist A/ROM 06/07/22: Digit ROM/blocking, Theraputty 7/25: wrist stretch and wrist strengthening  8/1: Heat   GOALS: Goals reviewed with patient? Yes   SHORT TERM GOALS: Target date: 07/13/22   Pt will be provided with and educated on HEP to improve mobility in LUE required for ADL completion.  Goal status: Ongoing    2.  Pt will decrease pain in LUE to 2/10 or less in order to swing a golf club with less pain.  Goal status: Ongoing   3.  Pt will increase ROM in left digits to improve ability to form a full grasp required for holding items.  Goal status: Ongoing    4.  Pt will increase left grip strength by 15# and pinch strength by 4# to improve ability to grasp and hold pots and pans during meal preparation.  Goal status:Ongoing   5.  Pt will decrease LUE fascial restrictions to trace amounts or less to improve mobility required for fine motor tasks such as opening containers and food packages.  Goal status: Ongoing        ASSESSMENT:   CLINICAL IMPRESSION: Pt presents today reporting increased pain/discomfort at  the left wrist, "it hurts like a toothache." Some relief following paraffin wax. Challenged grip strength with some discomfort noted "all over". Most pain with supination, decreased with manual therapy completed to supinator musculature. Pt able to complete progressed strengthening exercises, but reported some increased discomfort following. Continues with intrinsic tightness that responds well to passive stretching, but reporting that it is only temporary relief.      PLAN: OT FREQUENCY: 2x/week   OT DURATION: 6 weeks   PLANNED INTERVENTIONS: self care/ADL training, therapeutic exercise, therapeutic activity, manual therapy, passive range of motion, electrical stimulation, ultrasound, paraffin, moist heat, cryotherapy, energy conservation, and DME and/or AE instructions     CONSULTED AND AGREED WITH PLAN OF CARE: Patient   PLAN FOR NEXT SESSION:  PRINT HEP for blocking and theraputty, continue with grip strengthening, add pinch task, coordination targeting digit mobility. Paraffin      Mathews Robinsons, OTR/L [phone] 06/21/2022, 3:52 PM

## 2022-06-23 ENCOUNTER — Other Ambulatory Visit: Payer: Self-pay | Admitting: Nurse Practitioner

## 2022-06-26 ENCOUNTER — Ambulatory Visit (HOSPITAL_COMMUNITY): Payer: Medicare Other

## 2022-06-26 ENCOUNTER — Encounter (HOSPITAL_COMMUNITY): Payer: Self-pay

## 2022-06-26 DIAGNOSIS — R29898 Other symptoms and signs involving the musculoskeletal system: Secondary | ICD-10-CM

## 2022-06-26 DIAGNOSIS — M25632 Stiffness of left wrist, not elsewhere classified: Secondary | ICD-10-CM | POA: Diagnosis not present

## 2022-06-26 DIAGNOSIS — M79642 Pain in left hand: Secondary | ICD-10-CM

## 2022-06-26 DIAGNOSIS — R278 Other lack of coordination: Secondary | ICD-10-CM | POA: Diagnosis not present

## 2022-06-26 NOTE — Therapy (Unsigned)
OUTPATIENT OCCUPATIONAL THERAPY TREATMENT NOTE   Patient Name: [name] MRN: [id number] DOB:1941-10-11, 81 y.o., male Today's Date: 06/26/2022  PCP: Redmond School, MD REFERRING PROVIDER: Larena Glassman, MD  END OF SESSION:   OT End of Session - 06/26/22 1354     Visit Number 8    Number of Visits 12    Date for OT Re-Evaluation 07/13/22    Authorization Type 2)Medicare Part A and B   2)BCBS Supplement    OT Start Time 1347    OT Stop Time 1423    OT Time Calculation (min) 36 min    Activity Tolerance Patient tolerated treatment well    Behavior During Therapy [facility] for tasks assessed/performed                Past Medical History:  Diagnosis Date   Aortic atherosclerosis ([place])    Barrett's esophagus    Short-segment, diagnosed in 2007 by Dr. [name]   BPH (benign prostatic hyperplasia)    CAD (coronary artery disease)    a. cath on 07/21/2019 showing patent LM and LCx with 40-60% Proximal LAD and 30-40% Pro-RCA stenosis with normal LV function.   Essential hypertension    GERD (gastroesophageal reflux disease)    Hiatal hernia    History of kidney stones    PAF (paroxysmal atrial fibrillation) ([place])    S/P colonoscopy 2007   S/P endoscopy 08/2010    Salmon-colored epithelium coming up to 37 cm from the    Type 2 diabetes mellitus ([place])    Vertigo    Past Surgical History:  Procedure Laterality Date   APPENDECTOMY     BIOPSY  01/28/2020   Procedure: BIOPSY;  Surgeon: [name], MD;  [facility];  Service: Endoscopy;;  esophagus   CHOLECYSTECTOMY     COLONOSCOPY  02/06/2006   Dr. [name]- single anal papilla, o/w normal rectum, normal colon   COLONOSCOPY N/A 09/13/2016   RMR: three 5-6 mm for adenomas removed.  Recommend one last surveillance colonoscopy in October 2020.   COLONOSCOPY WITH PROPOFOL N/A 01/28/2020   Procedure: COLONOSCOPY WITH PROPOFOL;  Surgeon: [name], MD;  [facility];  Service: Endoscopy;  Laterality: N/A;   10:45am   CYSTOSCOPY WITH RETROGRADE PYELOGRAM, URETEROSCOPY AND STENT PLACEMENT Right 08/18/2020   Procedure: CYSTOSCOPY WITH INCISION OF RIGHT URETEROCELE, URETEROSCOPY WITH HOLMIUM LASER, CYSTOLITHOLAPAXY OF 10 MM BLADDER STONE;  Surgeon: [name], MD;  [facility];  Service: Urology;  Laterality: Right;   ESOPHAGOGASTRODUODENOSCOPY  09/15/2010   Dr. [name]- Salmon-colored epithelium coming up to 37 cm from the    ESOPHAGOGASTRODUODENOSCOPY N/A 07/02/2013   Dr. [name]- Barretts on bx. hiatal hernia   ESOPHAGOGASTRODUODENOSCOPY N/A 09/13/2016   RMR: Barrett's esophagus without dysplasia. one last EGD in 3 years.    ESOPHAGOGASTRODUODENOSCOPY (EGD) WITH PROPOFOL N/A 01/28/2020   Procedure: ESOPHAGOGASTRODUODENOSCOPY (EGD) WITH PROPOFOL;  Surgeon: [name], MD;  [facility];  Service: Endoscopy;  Laterality: N/A;   INTRAVASCULAR PRESSURE WIRE/FFR STUDY N/A 07/21/2019   Procedure: INTRAVASCULAR PRESSURE WIRE/FFR STUDY;  Surgeon: [name], MD;  Location: [place] [facility];  Service: Cardiovascular;  Laterality: N/A;   LEFT HEART CATH AND CORONARY ANGIOGRAPHY N/A 07/21/2019   Procedure: LEFT HEART CATH AND CORONARY ANGIOGRAPHY;  Surgeon: [name], MD;  Location: [place] [facility];  Service: Cardiovascular;  Laterality: N/A;   POLYPECTOMY  01/28/2020   Procedure: POLYPECTOMY;  Surgeon: [name], MD;  [facility];  Service: Endoscopy;;  colon   ROTATOR CUFF REPAIR     YAG LASER APPLICATION Right 07/20/4781   Procedure: YAG LASER APPLICATION;  Surgeon: [name], MD;  [facility];  Service: Ophthalmology;  Laterality: Right;   Patient Active Problem List   Diagnosis Date Noted   Subdural hematoma ([place]) 04/01/2022   DOE (dyspnea on exertion) 06/28/2020   Long-term current use of opiate analgesic 02/10/2020   Arthritis of right sacroiliac joint 02/08/2020   Lumbar radiculopathy 02/08/2020   Polyneuropathy due to type 2 diabetes mellitus  ([place]) 02/08/2020   Restless legs 02/08/2020   Chronic low back pain 02/08/2020   Polyarthropathy 02/08/2020   Fatigue 10/19/2019   H/O adenomatous polyp of colon 10/19/2019   Abnormal cardiac CT angiography    Encounter for therapeutic drug monitoring 07/02/2018   Atrial fibrillation ([place]) 06/24/2018   DIZZINESS 05/09/2010   Angina pectoris ([place]) 05/09/2010   GERD 01/31/2009   BARRETTS ESOPHAGUS 01/31/2009    ONSET DATE: 04/02/22  REFERRING DIAG: N56.213Y (ICD-10-CM) - Closed chip fracture of left triquetrum with routine healing, subsequent encounter  THERAPY DIAG:  Pain in left hand  Stiffness of left wrist, not elsewhere classified  Other lack of coordination  Other symptoms and signs involving the musculoskeletal system  Rationale for Evaluation and Treatment Rehabilitation  PERTINENT HISTORY: Pt reports a fall on 04/02/22 which resulted in a closed chip fracture of left triquetrum. He was instructed to wear a removable wrist brace for 5 weeks and encouraged to complete A/ROM exercises. He continued to have pain and stiffness, returned to specialist with a negative left wrist x-ray. Dr. Amedeo Kinsman referred pt for an OT evaluation and treatment to address limited ROM and increased pain.   PRECAUTIONS: None  SUBJECTIVE: S: "It's hurting today and real bad last night. Throbbing like a toothache."     PAIN:  Are you having pain? Yes: NPRS scale: 4/10 Pain location: left wrist Pain description: Aching Aggravating factors: certain movements Relieving factors: rest, ice     OBJECTIVE:   HAND DOMINANCE: Right   ADLs: Overall ADLs: Pt is independent in ADLs although has had to make some adaptations as certain activities cause increased pain.  He reports that he is unable to swing a golf club and is unable to pick up his 79-year-old grandson.        FUNCTIONAL OUTCOME MEASURES: Quick Dash: 43.18 8/8: 15.91   UPPER EXTREMITY ROM      Active ROM Left eval Left  8/8   Wrist flexion 45 55  Wrist extension 28 50  Wrist ulnar deviation 12 23  Wrist radial deviation 8 20  Wrist pronation 90 90  Wrist supination 81 90  (Blank rows = not tested)   Active ROM Left eval Left 8/8  Index MCP (0-90) 72  74  Index PIP (0-100)  50 81  Index DIP (0-70)  52 65  Long MCP (0-90)  78 77  Long PIP (0-100)  71 75  Long DIP (0-70)  53 59  Ring MCP (0-90)  72 71  Ring PIP (0-100)  71 89  Ring DIP (0-70)  51 49  Little MCP (0-90)  70 73  Little PIP (0-100)  62 71  Little DIP (0-70)  50 51  (Blank rows = not tested)     UPPER EXTREMITY MMT:      MMT Left eval Left 8/8  Elbow flexion  4+/5 5/5  Elbow extension  4+/5  5/5  Wrist flexion  4/5 5/5  Wrist extension  4/5 5/5  Wrist ulnar deviation  4/5 5/5  Wrist radial deviation  4+/5 5/5  Wrist pronation 4/5 5/5  Wrist supination  4/5 5/5  (Blank rows = not tested)   HAND FUNCTION: Grip strength: Right: 72 lbs; Left: 21 lbs, Lateral pinch: Right: 18 lbs, Left: 11 lbs, and 3 point pinch: Right: 14 lbs, Left: 6 lbs 8/1: 42#  8/8 (Left) Lateral: 16 lbs 3 point pinch: 7 lbs  Grip: 43#    COORDINATION: 9 Hole Peg test: Right: 38 sec; Left: 32 sec   SENSATION: Pt reports decreased sensation in tips of fingers, especially D4/5   EDEMA: Mild swelling throughout LUE   OBSERVATIONS: mild fascial restrictions throughout LUE       TODAY'S TREATMENT:  06/21/22 -Paraffin Wax, 10 minutes to decrease muscle restrictions and pain and allow for increased range of motion   -Grip Strength: 23# hand gripper to place and retrieve 20 pegs on ped board.  -Manual Therapy: Myofascial release and soft tissue mobilization of left forearm flexors and extensors and hand musculature to decrease pain and fascial restrictions and increase joint ROM. -P/ROM: 1x10 flexion/extension at each digit joint, blocking. 1x10 composite fist stretch  -P/ROM: 1x5 wrist flexion/extension with prolonged stretch at end range  -Wrist  Strengthening: 1x10 pronation/supination with velcro board   06/19/22 -Paraffin Wax, 10 minutes to decrease muscle restrictions and pain and allow for increased range of motion   -Manual Therapy: Myofascial release and soft tissue mobilization of left forearm flexors and extensors and hand musculature to decrease pain and fascial restrictions and increase joint ROM. -Pinch Strength: D1/4/5 to place and retrieve yellow and red pins, D1/2/3 to place and remove blue and green pins -Grip strengthening: 2x15 wrist neutral 23#  -P/ROM: 1x10 flexion/extension at each digit joint, blocking. 1x10 composite fist stretch  -P/ROM: 1x5 wrist flexion/extension with prolonged stretch at end range    06/14/22 -Manual Therapy: Myofascial release and soft tissue mobilization of left forearm flexors and extensors and hand musculature to decrease pain and fascial restrictions and increase joint ROM. -Moist heat applied to left hand, 10 minutes while completing manual techniques at forearms  -P/ROM: 1x10 flexion/extension at each digit joint, blocking. 1x10 composite fist stretch  -P/ROM: 1x5 wrist flexion/extension with prolonged stretch at end range  -Grip strengthening: 1x10 wrist neutral, pronation, supination, 29# -Palm-finger-palm translation x15 with medium bead    PATIENT EDUCATION: Education details: Heat Person educated: Patient Education method: Consulting civil engineer, Demonstration, and Handouts Education comprehension: verbalized understanding and returned demonstration     HOME EXERCISE PROGRAM: Eval: Tendon glides, Wrist A/ROM 06/07/22: Digit ROM/blocking, Theraputty 7/25: wrist stretch and wrist strengthening  8/1: Heat   GOALS: Goals reviewed with patient? Yes   SHORT TERM GOALS: Target date: 07/13/22   Pt will be provided with and educated on HEP to improve mobility in LUE required for ADL completion.  Goal status: Ongoing    2.  Pt will decrease pain in LUE to 2/10 or less in order to swing a  golf club with less pain.  Goal status: Ongoing   3.  Pt will increase ROM in left digits to improve ability to form a full grasp required for holding items.  Goal status: Ongoing   4.  Pt will increase left grip strength by 15# and pinch strength by 4# to improve ability to grasp and hold pots and pans during meal preparation.  Goal status:Ongoing   5.  Pt will decrease LUE fascial restrictions to trace amounts or less to improve mobility required for fine motor tasks such as opening containers and food packages.  Goal status: Ongoing        ASSESSMENT:   CLINICAL IMPRESSION: Pt presents today reporting increased pain/discomfort at the left wrist, "it hurts like a toothache." Some relief following paraffin wax. Challenged grip strength with some discomfort noted "all over". Most pain with supination, decreased with manual therapy completed to supinator musculature. Pt able to complete progressed strengthening exercises, but reported some increased discomfort following. Continues with intrinsic tightness that responds well to passive stretching, but reporting that it is only temporary relief.      PLAN: OT FREQUENCY: 2x/week   OT DURATION: 6 weeks   PLANNED INTERVENTIONS: self care/ADL training, therapeutic exercise, therapeutic activity, manual therapy, passive range of motion, electrical stimulation, ultrasound, paraffin, moist heat, cryotherapy, energy conservation, and DME and/or AE instructions     CONSULTED AND AGREED WITH PLAN OF CARE: Patient   PLAN FOR NEXT SESSION:  PRINT HEP for blocking and theraputty, continue with grip strengthening, add pinch task, coordination targeting digit mobility. Paraffin      Mathews Robinsons, OTR/L [phone] 06/26/2022, 5:35 PM

## 2022-06-27 DIAGNOSIS — B9689 Other specified bacterial agents as the cause of diseases classified elsewhere: Secondary | ICD-10-CM | POA: Diagnosis not present

## 2022-06-27 DIAGNOSIS — L905 Scar conditions and fibrosis of skin: Secondary | ICD-10-CM | POA: Diagnosis not present

## 2022-06-27 DIAGNOSIS — L02821 Furuncle of head [any part, except face]: Secondary | ICD-10-CM | POA: Diagnosis not present

## 2022-06-27 DIAGNOSIS — X32XXXD Exposure to sunlight, subsequent encounter: Secondary | ICD-10-CM | POA: Diagnosis not present

## 2022-06-27 DIAGNOSIS — L570 Actinic keratosis: Secondary | ICD-10-CM | POA: Diagnosis not present

## 2022-06-28 ENCOUNTER — Encounter (HOSPITAL_COMMUNITY): Payer: Medicare Other

## 2022-06-29 ENCOUNTER — Encounter: Payer: Self-pay | Admitting: Internal Medicine

## 2022-06-29 ENCOUNTER — Ambulatory Visit (INDEPENDENT_AMBULATORY_CARE_PROVIDER_SITE_OTHER): Payer: Medicare Other | Admitting: Internal Medicine

## 2022-06-29 VITALS — BP 108/66 | HR 77 | Temp 97.7°F | Ht 72.0 in | Wt 213.2 lb

## 2022-06-29 DIAGNOSIS — K2100 Gastro-esophageal reflux disease with esophagitis, without bleeding: Secondary | ICD-10-CM | POA: Diagnosis not present

## 2022-06-29 DIAGNOSIS — I2510 Atherosclerotic heart disease of native coronary artery without angina pectoris: Secondary | ICD-10-CM | POA: Diagnosis not present

## 2022-06-29 DIAGNOSIS — Z86010 Personal history of colonic polyps: Secondary | ICD-10-CM | POA: Diagnosis not present

## 2022-06-29 DIAGNOSIS — K2270 Barrett's esophagus without dysplasia: Secondary | ICD-10-CM | POA: Diagnosis not present

## 2022-06-29 DIAGNOSIS — K219 Gastro-esophageal reflux disease without esophagitis: Secondary | ICD-10-CM

## 2022-06-29 NOTE — Patient Instructions (Signed)
It was good to see you again today!  Continue taking Protonix 40 mg twice daily-best taken before meals (breakfast and supper)  No need for any further GI evaluation at this time  Unless something comes up, we will plan to see you back in the office in 1 year

## 2022-06-29 NOTE — Progress Notes (Unsigned)
Primary Care Physician:  Redmond School, MD Primary Gastroenterologist:  Dr. [name]  Pre-Procedure History & Physical: HPI:  [name] is a 81 y.o. male here for follow-up of reflux esophagitis/Barrett's esophagus history of colonic adenomas.  Last EGD and colonoscopy back in 2021 short segment Barrett's no dysplasia.  History of colonic adenomas.  Future colonoscopy is not recommended for surveillance due to age.  No dysphagia.  No rectal bleeding.  He continues to require twice daily Protonix to control his reflux symptoms.  If he misses a single dose he has symptoms.  He takes the medication before supper and breakfast.  Since he was last seen he lost his wife after 66 years of marriage.  He is having challenges dealing with that as expected.  He has anticoagulated due to atrial fibrillation.  He recently suffered a fall and suffered a small subdural hematoma.  He recovered uneventfully.  Past Medical History:  Diagnosis Date   Aortic atherosclerosis ([place])    Barrett's esophagus    Short-segment, diagnosed in 2007 by Dr. [name]   BPH (benign prostatic hyperplasia)    CAD (coronary artery disease)    a. cath on 07/21/2019 showing patent LM and LCx with 40-60% Proximal LAD and 30-40% Pro-RCA stenosis with normal LV function.   Essential hypertension    GERD (gastroesophageal reflux disease)    Hiatal hernia    History of kidney stones    PAF (paroxysmal atrial fibrillation) ([place])    S/P colonoscopy 2007   S/P endoscopy 08/2010    Salmon-colored epithelium coming up to 37 cm from the    Type 2 diabetes mellitus ([place])    Vertigo     Past Surgical History:  Procedure Laterality Date   APPENDECTOMY     BIOPSY  01/28/2020   Procedure: BIOPSY;  Surgeon: [name], MD;  [facility];  Service: Endoscopy;;  esophagus   CHOLECYSTECTOMY     COLONOSCOPY  02/06/2006   Dr. [name]- single anal papilla, o/w normal rectum, normal colon   COLONOSCOPY N/A 09/13/2016   RMR:  three 5-6 mm for adenomas removed.  Recommend one last surveillance colonoscopy in October 2020.   COLONOSCOPY WITH PROPOFOL N/A 01/28/2020   Procedure: COLONOSCOPY WITH PROPOFOL;  Surgeon: [name], MD;  [facility];  Service: Endoscopy;  Laterality: N/A;  10:45am   CYSTOSCOPY WITH RETROGRADE PYELOGRAM, URETEROSCOPY AND STENT PLACEMENT Right 08/18/2020   Procedure: CYSTOSCOPY WITH INCISION OF RIGHT URETEROCELE, URETEROSCOPY WITH HOLMIUM LASER, CYSTOLITHOLAPAXY OF 10 MM BLADDER STONE;  Surgeon: [name], MD;  [facility];  Service: Urology;  Laterality: Right;   ESOPHAGOGASTRODUODENOSCOPY  09/15/2010   Dr. [name]- Salmon-colored epithelium coming up to 37 cm from the    ESOPHAGOGASTRODUODENOSCOPY N/A 07/02/2013   Dr. [name]- Barretts on bx. hiatal hernia   ESOPHAGOGASTRODUODENOSCOPY N/A 09/13/2016   RMR: Barrett's esophagus without dysplasia. one last EGD in 3 years.    ESOPHAGOGASTRODUODENOSCOPY (EGD) WITH PROPOFOL N/A 01/28/2020   Procedure: ESOPHAGOGASTRODUODENOSCOPY (EGD) WITH PROPOFOL;  Surgeon: [name], MD;  [facility];  Service: Endoscopy;  Laterality: N/A;   INTRAVASCULAR PRESSURE WIRE/FFR STUDY N/A 07/21/2019   Procedure: INTRAVASCULAR PRESSURE WIRE/FFR STUDY;  Surgeon: [name], MD;  Location: [facility];  Service: Cardiovascular;  Laterality: N/A;   LEFT HEART CATH AND CORONARY ANGIOGRAPHY N/A 07/21/2019   Procedure: LEFT HEART CATH AND CORONARY ANGIOGRAPHY;  Surgeon: [name], MD;  Location: [facility];  Service: Cardiovascular;  Laterality: N/A;   POLYPECTOMY  01/28/2020   Procedure: POLYPECTOMY;  Surgeon: [name], MD;  [facility];  Service: Endoscopy;;  colon   ROTATOR CUFF REPAIR     YAG LASER APPLICATION Right 11/24/1094   Procedure: YAG LASER APPLICATION;  Surgeon: [name], MD;  [facility];  Service: Ophthalmology;  Laterality: Right;    Prior to Admission medications    Medication Sig Start Date End Date Taking? Authorizing Provider  Accu-Chek FastClix Lancets MISC 1 application by Does not apply route 2 (two) times daily. 08/23/20  Yes Brita Romp, NP  allopurinol (ZYLOPRIM) 100 MG tablet Take 100 mg by mouth daily.  02/12/19  Yes [provider]  ALPRAZolam Duanne Moron) 1 MG tablet Take 0.5 mg by mouth at bedtime as needed for anxiety or sleep.   Yes [provider]  Cholecalciferol (VITAMIN D3) 50 MCG (2000 UT) TABS Take 2,000 Units by mouth daily.   Yes [provider]  DULoxetine (CYMBALTA) 60 MG capsule Take 60 mg by mouth daily. 10/20/20  Yes [provider]  escitalopram (LEXAPRO) 10 MG tablet Take 10 mg by mouth daily. 01/02/22  Yes [provider]  fexofenadine (ALLEGRA) 180 MG tablet Take 180 mg by mouth daily.   Yes [provider]  finasteride (PROSCAR) 5 MG tablet Take 1 tablet (5 mg total) by mouth daily. 02/01/22  Yes [name], MD  gabapentin (NEURONTIN) 300 MG capsule Take 300 mg by mouth 2 (two) times daily.    Yes [provider]  glipiZIDE (GLUCOTROL) 5 MG tablet Take 5 mg by mouth daily. 07/26/21  Yes [provider]  glucose blood test strip 1 each by Other route daily before breakfast. Use as instructed 09/26/20  Yes Reardon, Loree Fee J, NP  metFORMIN (GLUCOPHAGE-XR) 500 MG 24 hr tablet TAKE TWO TABLETS ('1000MG'$  TOTAL) BY MOUTHDAILY WITH BREAKFAST 06/25/22  Yes Brita Romp, NP  pantoprazole (PROTONIX) 40 MG tablet TAKE ONE TABLET ('40MG'$  TOTAL) BY MOUTH TWO TIMES DAILY BEFORE A MEAL 04/19/22  Yes Mahala Menghini, PA-C  rosuvastatin (CRESTOR) 10 MG tablet TAKE ONE TABLET ('10MG'$  TOTAL) BY MOUTH DAILY 04/24/22  Yes Satira Sark, MD  sildenafil (VIAGRA) 50 MG tablet Take 50 mg by mouth daily. 12/21/21  Yes [provider]  tadalafil (CIALIS) 20 MG tablet Take 1 tablet (20 mg total) by mouth daily as needed for erectile dysfunction. 06/07/22  Yes [name], MD   tamsulosin (FLOMAX) 0.4 MG CAPS capsule Take 1 capsule (0.4 mg total) by mouth 2 (two) times daily. 02/01/22  Yes [name], MD  traMADol (ULTRAM) 50 MG tablet Take 1 tablet (50 mg total) by mouth every 6 (six) hours as needed for moderate pain (pain.). 08/18/20  Yes [name], MD  warfarin (COUMADIN) 5 MG tablet TAKE ONE TABLET BY MOUTH DAILY EXCEPT TAKE ONE AND ONE-HALF TABLETS ON SATURDAY OR AS DIRECTED 04/09/22  Yes Marvis Moeller, NP    Allergies as of 06/29/2022   (No Known Allergies)    Family History  Problem Relation Age of Onset   Prostate cancer Father        deceased   Stroke Mother        deceased   Colon cancer Neg Hx     Social History   Socioeconomic History   Marital status: Married    Spouse name: Not on file   Number of children: Not on file   Years  of education: Not on file   Highest education level: Not on file  Occupational History   Not on file  Tobacco Use   Smoking status: Former    Packs/day: 1.50    Years: 30.00    Total pack years: 45.00    Types: Cigarettes    Quit date: 11/18/1993    Years since quitting: 28.6   Smokeless tobacco: Never   Tobacco comments:    quit in the 90's  Vaping Use   Vaping Use: Never used  Substance and Sexual Activity   Alcohol use: No   Drug use: No   Sexual activity: Not on file  Other Topics Concern   Not on file  Social History Narrative   Not on file   Social Determinants of Health   Financial Resource Strain: Not on file  Food Insecurity: Not on file  Transportation Needs: Not on file  Physical Activity: Not on file  Stress: Not on file  Social Connections: Not on file  Intimate Partner Violence: Not on file    Review of Systems: See HPI, otherwise negative ROS  Physical Exam: BP 108/66 (BP Location: Left Arm, Patient Position: Sitting, Cuff Size: Normal)   Pulse 77   Temp 97.7 F (36.5 C) (Temporal)   Ht 6' (1.829 m)   Wt 213 lb 3.2 oz (96.7 kg)   SpO2 97%   BMI 28.92 kg/m   General:   Alert,  Well-developed, well-nourished, pleasant and cooperative in NAD Mouth:  No deformity or lesions. Neck:  Supple; no masses or thyromegaly. No significant cervical adenopathy. Lungs:  Clear throughout to auscultation.   No wheezes, crackles, or rhonchi. No acute distress. Heart:  Regular rate and rhythm; no murmurs, clicks, rubs,  or gallops. Abdomen: Non-distended, normal bowel sounds.  Soft and nontender without appreciable mass or hepatosplenomegaly.  Pulses:  Normal pulses noted. Extremities:  Without clubbing or edema.  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  Short segment Barrett's with no dysplasia and a history of colonic adenoma he was really aged out of surveillance who continues to do well on twice daily PPI therapy.  However, he does remain dependent on the twice daily regimen.  We discussed the multipronged approach to reflux including dietary initiatives.  Condolences given for the loss of this spouse.  Recommendations:  Continue taking Protonix 40 mg twice daily-best taken before meals (breakfast and supper)  No need for any further GI evaluation at this time  Unless something comes up, we will plan to see you back in the office in 1 year    Notice: This dictation was prepared with Dragon dictation along with smaller phrase technology. Any transcriptional errors that result from this process are unintentional and may not be corrected upon review.

## 2022-07-02 ENCOUNTER — Ambulatory Visit (INDEPENDENT_AMBULATORY_CARE_PROVIDER_SITE_OTHER): Payer: Medicare Other | Admitting: *Deleted

## 2022-07-02 DIAGNOSIS — Z5181 Encounter for therapeutic drug level monitoring: Secondary | ICD-10-CM | POA: Diagnosis not present

## 2022-07-02 DIAGNOSIS — I4891 Unspecified atrial fibrillation: Secondary | ICD-10-CM | POA: Diagnosis not present

## 2022-07-02 LAB — POCT INR: INR: 2.8 (ref 2.0–3.0)

## 2022-07-02 NOTE — Patient Instructions (Signed)
Continue warfarin 1 tablet daily except 1 1/2 tablets on Saturdays Recheck INR in 6 wk Call us with any new medications, changes or any bleeding concerns @ [phone].

## 2022-07-03 ENCOUNTER — Encounter (HOSPITAL_COMMUNITY): Payer: Medicare Other | Admitting: Occupational Therapy

## 2022-07-05 ENCOUNTER — Encounter (HOSPITAL_COMMUNITY): Payer: Medicare Other

## 2022-07-10 ENCOUNTER — Encounter (HOSPITAL_COMMUNITY): Payer: Medicare Other

## 2022-07-10 ENCOUNTER — Ambulatory Visit: Payer: Medicare Other | Admitting: Internal Medicine

## 2022-07-12 ENCOUNTER — Encounter (HOSPITAL_COMMUNITY): Payer: Medicare Other

## 2022-07-26 ENCOUNTER — Ambulatory Visit: Payer: Medicare Other | Admitting: Urology

## 2022-07-26 ENCOUNTER — Ambulatory Visit (INDEPENDENT_AMBULATORY_CARE_PROVIDER_SITE_OTHER): Payer: Medicare Other | Admitting: Urology

## 2022-07-26 ENCOUNTER — Encounter: Payer: Self-pay | Admitting: Urology

## 2022-07-26 VITALS — BP 139/71 | HR 91

## 2022-07-26 DIAGNOSIS — R972 Elevated prostate specific antigen [PSA]: Secondary | ICD-10-CM | POA: Diagnosis not present

## 2022-07-26 DIAGNOSIS — N401 Enlarged prostate with lower urinary tract symptoms: Secondary | ICD-10-CM | POA: Diagnosis not present

## 2022-07-26 DIAGNOSIS — N138 Other obstructive and reflux uropathy: Secondary | ICD-10-CM | POA: Diagnosis not present

## 2022-07-26 DIAGNOSIS — I2510 Atherosclerotic heart disease of native coronary artery without angina pectoris: Secondary | ICD-10-CM | POA: Diagnosis not present

## 2022-07-26 DIAGNOSIS — N5201 Erectile dysfunction due to arterial insufficiency: Secondary | ICD-10-CM

## 2022-07-26 DIAGNOSIS — R3912 Poor urinary stream: Secondary | ICD-10-CM | POA: Diagnosis not present

## 2022-07-26 LAB — URINALYSIS, ROUTINE W REFLEX MICROSCOPIC
Bilirubin, UA: NEGATIVE
Leukocytes,UA: NEGATIVE
Nitrite, UA: NEGATIVE
Protein,UA: NEGATIVE
Specific Gravity, UA: 1.02 (ref 1.005–1.030)
Urobilinogen, Ur: 0.2 mg/dL (ref 0.2–1.0)
pH, UA: 5 (ref 5.0–7.5)

## 2022-07-26 LAB — MICROSCOPIC EXAMINATION
Bacteria, UA: NONE SEEN
Epithelial Cells (non renal): NONE SEEN /hpf (ref 0–10)
Renal Epithel, UA: NONE SEEN /hpf
WBC, UA: NONE SEEN /hpf (ref 0–5)

## 2022-07-26 MED ORDER — PAPAV-PHENTOLAMINE-ALPROSTADIL 150-5-50 MG-MG-MCG IC SOLR: INTRACAVERNOUS | 5 refills | Status: DC

## 2022-07-26 NOTE — Progress Notes (Signed)
H&P 1. Elevated PSA   2. Erectile dysfunction due to arterial insufficiency   3. BPH with urinary obstruction   4. Weak urinary stream     Chief Complaint: LUTS  History of Present Illness:  07/26/22: [name] returns today in f/u.  He was supposed to have a PSA prior to this visit but I don't see that was done.  He had the tadalafil increased to '20mg'$  at his last visit but has had an insufficient response.  He gets a partial erection but it is not sufficient for penetration and has difficulty maintaining it.  He has reduced sensation of orgasm and minimal fluid.     7/20/23[name] returns today in f/u.  He has some progressive ED.  He didn't respond to tadalafil '5mg'$  or sildenafil.  He remains of finasteride and tamsulosin.  His IPSS is 17 with a reduced stream, intermittency and urgency.  He has nocturia x 2.  He has had no flank pain or hematuria.  He has a history of stones and a horseshoe kidney.  He remains on warfarin.   3/16/23[name] returns today in f/u.  His PSA was down to 3.2 on 08/10/21 from 6.3 on 12/04/19.  He reports malodorous urine but no hematuria or dysuria.  He remains on finasteride and tamsulosin 0.'8mg'$ .  The nocturia is down to 1-2x.  He has a decent stream during the day.  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  He has ED and has tadalafil and sildenafil but hasn't had much response.  The tadalafil was expensive.   08/10/21: [name] returns today in f/u.  He is current on finasteride and tamsulosin 0.8 mg daily for his BPH with BOO and he reports improvement in his symptoms with the addition of the finasteride.  His IPSS is down to 23 from 30.  He has a good stream in the day that is slow at night.  He has nocturia x 3.  He has intermittency at night.  He has no incontinence.  He has had no hematuria or flank pain.  He can get a minimal partial erection and is inquiring about options.    02/02/21: [name] returns today in f/u.  He was given  doxycyline on 11/04/20 for possible diverticulitis.   He had the CT I ordered on 12/26/20 and it showed diverticular disease without diverticulitis.  He has a horseshoe kidney with no residual stone disease or obstruction.  He has fairly severe LUTS with an IPSS of 30.  His urgency, frequency and intermittency.  He has a reduced stream at night and nocturia x 3.  He remains on tamsulosin.  He feels generally poorly today but has no fever.  He has no hematuria or dysuria.   12/17/21[name] returns today with a 1 week history of LLQ pain and hesitancy with frequent small voids.  He is not sleeping well because of the pain.  He has a history of stones and prostatitis.  He UA has 0-2 RBC's.  He had the Covid booster a week ago and had fever with that.  He has had no nausea.  He has a horseshoe kidney that had no left stones on a CT in 9/21.  He did have diverticular disease.   09/02/20: [name] returns today in f/u from recent right ureteral stone extraction.  The stone was impacted and I had to incise the ureteral meatus to release the stone and then fragment it to get  it out of the bladder.   He has no hematuria.   He has back pain but no flank pain .  He is voiding well.  His IPSS is 21.  07/01/20: [name] returns today in f/u.  He reports resolution of the penile lesion.   He continues to have nocturia but he is getting up a lot with his wife who has to get up.   He remains on tamsulosin.  His IPSS is 24.  He has been having some progressive SOB and Dr. Melvyn Novas had some questions about whether the tamsulosin may be contributing to the breathing issues.   His PSA in 1/21 was 6.3 which was down from 9.7.  He has some LLQ discomfort at times.  He has a horseshoe kidney and a right renal stone on a CT in 2015.   3.23.2021: Most recent PSA was 6.3 on 1.5.2021. He presents today c/o of sore spot underneath his penis that he has had for the last 2 wks. This has been too painful to touch. He has tried managing pain with  medications and hot baths without success. He has also tried to "pop" it without producing any drainage. This pain has started to lessen in severity but he has recently begun having some mild dysuria and increased nocturia. Otherwise, his urinary sx's are stable.      01/27/19 10/23/18 10/17/17 09/18/16 09/06/15 03/02/15 04/03/14 06/13/12  PSA  Total PSA 6.4 ng/dl 9.7 ng/dl 5.3 ng/dl 5.8  4.66  5.64  5.20  4.36   Free PSA    1.0  0.56  0.58  0.64  0.62   % Free PSA  8 %  '17  12  10  12  14     '$ IPSS     Row Name 07/26/22 1300         International Prostate Symptom Score   How often have you had the sensation of not emptying your bladder? Less than half the time     How often have you had to urinate less than every two hours? Less than half the time     How often have you found you stopped and started again several times when you urinated? More than half the time     How often have you found it difficult to postpone urination? More than half the time     How often have you had a weak urinary stream? Less than half the time     How often have you had to strain to start urination? Less than 1 in 5 times     How many times did you typically get up at night to urinate? 2 Times     Total IPSS Score 17       Quality of Life due to urinary symptoms   If you were to spend the rest of your life with your urinary condition just the way it is now how would you feel about that? Unhappy              ROS: Fatigue, Congestion, Heartburn, and Easy bruising.   Past Medical History:  Diagnosis Date   Aortic atherosclerosis ([place])    Barrett's esophagus    Short-segment, diagnosed in 2007 by Dr. [name]   BPH (benign prostatic hyperplasia)    CAD (coronary artery disease)    a. cath on 07/21/2019 showing patent LM and LCx with 40-60% Proximal LAD and 30-40% Pro-RCA stenosis with normal LV function.   Essential hypertension  GERD (gastroesophageal reflux disease)    Hiatal hernia    History of  kidney stones    PAF (paroxysmal atrial fibrillation) ([place])    S/P colonoscopy 2007   S/P endoscopy 08/2010    Salmon-colored epithelium coming up to 37 cm from the    Type 2 diabetes mellitus ([place])    Vertigo     Past Surgical History:  Procedure Laterality Date   APPENDECTOMY     BIOPSY  01/28/2020   Procedure: BIOPSY;  Surgeon: [name], MD;  [facility];  Service: Endoscopy;;  esophagus   CHOLECYSTECTOMY     COLONOSCOPY  02/06/2006   Dr. [name]- single anal papilla, o/w normal rectum, normal colon   COLONOSCOPY N/A 09/13/2016   RMR: three 5-6 mm for adenomas removed.  Recommend one last surveillance colonoscopy in October 2020.   COLONOSCOPY WITH PROPOFOL N/A 01/28/2020   Procedure: COLONOSCOPY WITH PROPOFOL;  Surgeon: [name], MD;  [facility];  Service: Endoscopy;  Laterality: N/A;  10:45am   CYSTOSCOPY WITH RETROGRADE PYELOGRAM, URETEROSCOPY AND STENT PLACEMENT Right 08/18/2020   Procedure: CYSTOSCOPY WITH INCISION OF RIGHT URETEROCELE, URETEROSCOPY WITH HOLMIUM LASER, CYSTOLITHOLAPAXY OF 10 MM BLADDER STONE;  Surgeon: [name], MD;  [facility];  Service: Urology;  Laterality: Right;   ESOPHAGOGASTRODUODENOSCOPY  09/15/2010   Dr. [name]- Salmon-colored epithelium coming up to 37 cm from the    ESOPHAGOGASTRODUODENOSCOPY N/A 07/02/2013   Dr. [name]- Barretts on bx. hiatal hernia   ESOPHAGOGASTRODUODENOSCOPY N/A 09/13/2016   RMR: Barrett's esophagus without dysplasia. one last EGD in 3 years.    ESOPHAGOGASTRODUODENOSCOPY (EGD) WITH PROPOFOL N/A 01/28/2020   Procedure: ESOPHAGOGASTRODUODENOSCOPY (EGD) WITH PROPOFOL;  Surgeon: [name], MD;  [facility];  Service: Endoscopy;  Laterality: N/A;   INTRAVASCULAR PRESSURE WIRE/FFR STUDY N/A 07/21/2019   Procedure: INTRAVASCULAR PRESSURE WIRE/FFR STUDY;  Surgeon: [name], MD;  Location: [facility];  Service: Cardiovascular;  Laterality: N/A;   LEFT HEART CATH AND  CORONARY ANGIOGRAPHY N/A 07/21/2019   Procedure: LEFT HEART CATH AND CORONARY ANGIOGRAPHY;  Surgeon: [name], MD;  Location: [facility];  Service: Cardiovascular;  Laterality: N/A;   POLYPECTOMY  01/28/2020   Procedure: POLYPECTOMY;  Surgeon: [name], MD;  [facility];  Service: Endoscopy;;  colon   ROTATOR CUFF REPAIR     YAG LASER APPLICATION Right 06/23/276   Procedure: YAG LASER APPLICATION;  Surgeon: [name], MD;  [facility];  Service: Ophthalmology;  Laterality: Right;    Home Medications:  Allergies as of 07/26/2022   No Known Allergies      Medication List        Accurate as of July 26, 2022 11:59 PM. If you have any questions, ask your nurse or doctor.          Accu-Chek FastClix Lancets Misc 1 application by Does not apply route 2 (two) times daily.   allopurinol 100 MG tablet Commonly known as: ZYLOPRIM Take 100 mg by mouth daily.   ALPRAZolam 1 MG tablet Commonly known as: XANAX Take 0.5 mg by mouth at bedtime as needed for anxiety or sleep.   DULoxetine 60 MG capsule Commonly known as: CYMBALTA Take 60 mg by mouth daily.   escitalopram 10 MG tablet Commonly known as: LEXAPRO Take 10 mg by mouth daily.   fexofenadine 180 MG tablet Commonly known as: ALLEGRA Take 180 mg by mouth daily.  finasteride 5 MG tablet Commonly known as: PROSCAR Take 1 tablet (5 mg total) by mouth daily.   gabapentin 300 MG capsule Commonly known as: NEURONTIN Take 300 mg by mouth 2 (two) times daily.   glipiZIDE 5 MG tablet Commonly known as: GLUCOTROL Take 5 mg by mouth daily.   glucose blood test strip 1 each by Other route daily before breakfast. Use as instructed   metFORMIN 500 MG 24 hr tablet Commonly known as: GLUCOPHAGE-XR TAKE TWO TABLETS ('1000MG'$  TOTAL) BY MOUTHDAILY WITH BREAKFAST   pantoprazole 40 MG tablet Commonly known as: PROTONIX TAKE ONE TABLET ('40MG'$  TOTAL) BY MOUTH TWO TIMES DAILY BEFORE A MEAL    Papav-Phentolamine-Alprostadil 150-5-50 MG-MG-MCG Solr Trimix with (PAP '30mg'$ /REG '1mg'$ /PGE1 49mg)/ml.    Injection 0.5 to 115mintracavernosally 2-3x weekly as needed Started by: JoIrine SealMD   rosuvastatin 10 MG tablet Commonly known as: CRESTOR TAKE ONE TABLET ('10MG'$  TOTAL) BY MOUTH DAILY   sildenafil 50 MG tablet Commonly known as: VIAGRA Take 50 mg by mouth daily.   tadalafil 20 MG tablet Commonly known as: CIALIS Take 1 tablet (20 mg total) by mouth daily as needed for erectile dysfunction.   tamsulosin 0.4 MG Caps capsule Commonly known as: FLOMAX Take 1 capsule (0.4 mg total) by mouth 2 (two) times daily.   traMADol 50 MG tablet Commonly known as: ULTRAM Take 1 tablet (50 mg total) by mouth every 6 (six) hours as needed for moderate pain (pain.).   Vitamin D3 50 MCG (2000 UT) Tabs Take 2,000 Units by mouth daily.   warfarin 5 MG tablet Commonly known as: COUMADIN Take as directed by the anticoagulation clinic. If you are unsure how to take this medication, talk to your nurse or doctor. Original instructions: TAKE ONE TABLET BY MOUTH DAILY EXCEPT TAKE ONE AND ONE-HALF TABLETS ON SATURDAY OR AS DIRECTED        Allergies: No Known Allergies  Family History  Problem Relation Age of Onset   Prostate cancer Father        deceased   Stroke Mother        deceased   Colon cancer Neg Hx     Social History:  reports that he quit smoking about 28 years ago. His smoking use included cigarettes. He has a 45.00 pack-year smoking history. He has never used smokeless tobacco. He reports that he does not drink alcohol and does not use drugs.  ROS: A complete review of systems was performed.  All systems are negative except for pertinent findings as noted.  Physical Exam:  BP 139/71   Pulse 91   Gen: WD, WN in NAD.   Laboratory Data:  UA has 0-2 RBC's.   Results for orders placed or performed in visit on 07/26/22 (from the past 24 hour(s))  Urinalysis, Routine w  reflex microscopic     Status: Abnormal   Collection Time: 07/26/22  1:21 PM  Result Value Ref Range   Specific Gravity, UA 1.020 1.005 - 1.030   pH, UA 5.0 5.0 - 7.5   Color, UA Yellow Yellow   Appearance Ur Clear Clear   Leukocytes,UA Negative Negative   Protein,UA Negative Negative/Trace   Glucose, UA 3+ (A) Negative   Ketones, UA Trace (A) Negative   RBC, UA Trace (A) Negative   Bilirubin, UA Negative Negative   Urobilinogen, Ur 0.2 0.2 - 1.0 mg/dL   Nitrite, UA Negative Negative   Microscopic Examination See below:    Narrative   Performed at:  [address] [facility] [place] [address], [place], [place]  [id number] Lab Director: Mina Marble MT, Phone:  [phone]  Microscopic Examination     Status: None   Collection Time: 07/26/22  1:21 PM   Urine  Result Value Ref Range   WBC, UA None seen 0 - 5 /hpf   RBC, Urine 0-2 0 - 2 /hpf   Epithelial Cells (non renal) None seen 0 - 10 /hpf   Renal Epithel, UA None seen None seen /hpf   Mucus, UA Present Not Estab.   Bacteria, UA None seen None seen/Few   Narrative   Performed at:  [facility] [name] [place], [place]  [id number] Lab Director: [place], Phone:  [phone]         Impression/Assessment:  ED.  This has progressed.  He didn't respond to tadalafil '20mg'$ .   I discussed injections and combination oral meds and I have send a script to [facility] for trimix and will have him return for a test injection.   BPH with BOO he has stable LUTS on current therapy.   Elevated PSA.  His PSA was down 50% on finasteride.  I will repeat that today.   Hx of horseshoe kidney with stones.   CT on 12/26/20 was ok.   He has no symptoms        Orders Placed This Encounter  Procedures   Microscopic Examination   Urinalysis, Routine w reflex microscopic   PSA, total and free   Meds ordered this encounter  Medications   Papav-Phentolamine-Alprostadil 150-5-50 MG-MG-MCG SOLR    Sig: Trimix  with (PAP '30mg'$ /REG '1mg'$ /PGE1 44mg)/ml.    Injection 0.5 to 147mintracavernosally 2-3x weekly as needed    Dispense:  5 each    Refill:  5   Return for Next available for penile injection instructions.  To bring med with him. , Next available.   CC: Dr. LaRedmond [employer] Patient ID: DwDaphane Shepherdmale   DOB: 01/1941/06/[id number].o.   MRN: [id number]

## 2022-07-27 LAB — PSA, TOTAL AND FREE
PSA, Free Pct: 8.5 %
PSA, Free: 0.44 ng/mL
Prostate Specific Ag, Serum: 5.2 ng/mL — ABNORMAL HIGH (ref 0.0–4.0)

## 2022-07-30 ENCOUNTER — Telehealth: Payer: Self-pay

## 2022-07-30 NOTE — Telephone Encounter (Signed)
-----   Message from [name], MD sent at 07/27/2022  3:42 PM EDT ----- His PSA is up to 5.2 from 3.2 at his last check and I don't like to see it coming back up on the finasteride.   I would like to get a repeat PSA in about 6 weeks and he needs to avoid ejaculation for a week prior to the blood draw.  ----- Message ----- From: Sherrilyn Rist, CMA Sent: 07/27/2022  11:41 AM EDT To: [name], MD  Please Review

## 2022-07-30 NOTE — Telephone Encounter (Signed)
Made patient aware that his PSA is up to 5.2 from last check that was 3.2. Made patient aware that he need a repeat PSA in about 6 weeks and to avoid ejaculation prior to blood draw. Patient voiced understanding.

## 2022-08-02 ENCOUNTER — Ambulatory Visit: Payer: Medicare Other | Admitting: Urology

## 2022-08-15 ENCOUNTER — Ambulatory Visit: Payer: Medicare Other | Attending: Cardiology

## 2022-08-16 ENCOUNTER — Encounter: Payer: Self-pay | Admitting: Urology

## 2022-08-16 ENCOUNTER — Ambulatory Visit (INDEPENDENT_AMBULATORY_CARE_PROVIDER_SITE_OTHER): Payer: Medicare Other | Admitting: Urology

## 2022-08-16 VITALS — BP 135/67 | HR 75

## 2022-08-16 DIAGNOSIS — N5201 Erectile dysfunction due to arterial insufficiency: Secondary | ICD-10-CM

## 2022-08-16 DIAGNOSIS — R972 Elevated prostate specific antigen [PSA]: Secondary | ICD-10-CM

## 2022-08-16 DIAGNOSIS — I2510 Atherosclerotic heart disease of native coronary artery without angina pectoris: Secondary | ICD-10-CM

## 2022-08-16 NOTE — Progress Notes (Signed)
H&P 1. Erectile dysfunction due to arterial insufficiency   2. Elevated PSA     Chief Complaint: LUTS  History of Present Illness:   08/16/22: Allard returns today for a penile test injection with trimix.   His PSA was up to 5.2 from 3.2 at his last visit.  He is on finasteride.    9/7/23Orpah Greek returns today in f/u.  He was supposed to have a PSA prior to this visit but I don't see that was done.  He had the tadalafil increased to '20mg'$  at his last visit but has had an insufficient response.  He gets a partial erection but it is not sufficient for penetration and has difficulty maintaining it.  He has reduced sensation of orgasm and minimal fluid.     7/20/23Orpah Greek returns today in f/u.  He has some progressive ED.  He didn't respond to tadalafil '5mg'$  or sildenafil.  He remains of finasteride and tamsulosin.  His IPSS is 17 with a reduced stream, intermittency and urgency.  He has nocturia x 2.  He has had no flank pain or hematuria.  He has a history of stones and a horseshoe kidney.  He remains on warfarin.   3/16/23Orpah Greek returns today in f/u.  His PSA was down to 3.2 on 08/10/21 from 6.3 on 12/04/19.  He reports malodorous urine but no hematuria or dysuria.  He remains on finasteride and tamsulosin 0.'8mg'$ .  The nocturia is down to 1-2x.  He has a decent stream during the day.  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  He has ED and has tadalafil and sildenafil but hasn't had much response.  The tadalafil was expensive.   08/10/21: Dwith returns today in f/u.  He is current on finasteride and tamsulosin 0.8 mg daily for his BPH with BOO and he reports improvement in his symptoms with the addition of the finasteride.  His IPSS is down to 23 from 30.  He has a good stream in the day that is slow at night.  He has nocturia x 3.  He has intermittency at night.  He has no incontinence.  He has had no hematuria or flank pain.  He can get a minimal partial erection and  is inquiring about options.    02/02/21: Antonino returns today in f/u.  He was given doxycyline on 11/04/20 for possible diverticulitis.   He had the CT I ordered on 12/26/20 and it showed diverticular disease without diverticulitis.  He has a horseshoe kidney with no residual stone disease or obstruction.  He has fairly severe LUTS with an IPSS of 30.  His urgency, frequency and intermittency.  He has a reduced stream at night and nocturia x 3.  He remains on tamsulosin.  He feels generally poorly today but has no fever.  He has no hematuria or dysuria.   12/17/21Orpah Greek returns today with a 1 week history of LLQ pain and hesitancy with frequent small voids.  He is not sleeping well because of the pain.  He has a history of stones and prostatitis.  He UA has 0-2 RBC's.  He had the Covid booster a week ago and had fever with that.  He has had no nausea.  He has a horseshoe kidney that had no left stones on a CT in 9/21.  He did have diverticular disease.   09/02/20: Emanuell returns today in f/u from recent right ureteral stone extraction.  The  stone was impacted and I had to incise the ureteral meatus to release the stone and then fragment it to get it out of the bladder.   He has no hematuria.   He has back pain but no flank pain .  He is voiding well.  His IPSS is 21.  07/01/20: Danyal returns today in f/u.  He reports resolution of the penile lesion.   He continues to have nocturia but he is getting up a lot with his wife who has to get up.   He remains on tamsulosin.  His IPSS is 24.  He has been having some progressive SOB and Dr. Melvyn Novas had some questions about whether the tamsulosin may be contributing to the breathing issues.   His PSA in 1/21 was 6.3 which was down from 9.7.  He has some LLQ discomfort at times.  He has a horseshoe kidney and a right renal stone on a CT in 2015.   3.23.2021: Most recent PSA was 6.3 on 1.5.2021. He presents today c/o of sore spot underneath his penis that he has had for  the last 2 wks. This has been too painful to touch. He has tried managing pain with medications and hot baths without success. He has also tried to "pop" it without producing any drainage. This pain has started to lessen in severity but he has recently begun having some mild dysuria and increased nocturia. Otherwise, his urinary sx's are stable.      01/27/19 10/23/18 10/17/17 09/18/16 09/06/15 03/02/15 04/03/14 06/13/12  PSA  Total PSA 6.4 ng/dl 9.7 ng/dl 5.3 ng/dl 5.8  4.66  5.64  5.20  4.36   Free PSA    1.0  0.56  0.58  0.64  0.62   % Free PSA  8 %  '17  12  10  12  14       '$ ROS: Fatigue, Congestion, Heartburn, and Easy bruising.   Past Medical History:  Diagnosis Date   Aortic atherosclerosis ([place])    Barrett's esophagus    Short-segment, diagnosed in 2007 by Dr. [name]   BPH (benign prostatic hyperplasia)    CAD (coronary artery disease)    a. cath on 07/21/2019 showing patent LM and LCx with 40-60% Proximal LAD and 30-40% Pro-RCA stenosis with normal LV function.   Essential hypertension    GERD (gastroesophageal reflux disease)    Hiatal hernia    History of kidney stones    PAF (paroxysmal atrial fibrillation) ([place])    S/P colonoscopy 2007   S/P endoscopy 08/2010    Salmon-colored epithelium coming up to 37 cm from the    Type 2 diabetes mellitus ([place])    Vertigo     Past Surgical History:  Procedure Laterality Date   APPENDECTOMY     BIOPSY  01/28/2020   Procedure: BIOPSY;  Surgeon: [name], MD;  Location: [facility];  Service: Endoscopy;;  esophagus   CHOLECYSTECTOMY     COLONOSCOPY  02/06/2006   Dr. [name]- single anal papilla, o/w normal rectum, normal colon   COLONOSCOPY N/A 09/13/2016   RMR: three 5-6 mm for adenomas removed.  Recommend one last surveillance colonoscopy in October 2020.   COLONOSCOPY WITH PROPOFOL N/A 01/28/2020   Procedure: COLONOSCOPY WITH PROPOFOL;  Surgeon: [name], MD;  Location: [facility];  Service: Endoscopy;   Laterality: N/A;  10:45am   CYSTOSCOPY WITH RETROGRADE PYELOGRAM, URETEROSCOPY AND STENT PLACEMENT Right 08/18/2020   Procedure: CYSTOSCOPY WITH INCISION OF RIGHT URETEROCELE,  URETEROSCOPY WITH HOLMIUM LASER, CYSTOLITHOLAPAXY OF 10 MM BLADDER STONE;  Surgeon: [name], MD;  Location: [facility];  Service: Urology;  Laterality: Right;   ESOPHAGOGASTRODUODENOSCOPY  09/15/2010   Dr. [name]- Salmon-colored epithelium coming up to 37 cm from the    ESOPHAGOGASTRODUODENOSCOPY N/A 07/02/2013   Dr. [name]- Barretts on bx. hiatal hernia   ESOPHAGOGASTRODUODENOSCOPY N/A 09/13/2016   RMR: Barrett's esophagus without dysplasia. one last EGD in 3 years.    ESOPHAGOGASTRODUODENOSCOPY (EGD) WITH PROPOFOL N/A 01/28/2020   Procedure: ESOPHAGOGASTRODUODENOSCOPY (EGD) WITH PROPOFOL;  Surgeon: [name], MD;  [facility]: [facility];  Service: Endoscopy;  Laterality: N/A;   INTRAVASCULAR PRESSURE WIRE/FFR STUDY N/A 07/21/2019   Procedure: INTRAVASCULAR PRESSURE WIRE/FFR STUDY;  Surgeon: [name], MD;  Location: [facility];  Service: Cardiovascular;  Laterality: N/A;   LEFT HEART CATH AND CORONARY ANGIOGRAPHY N/A 07/21/2019   Procedure: LEFT HEART CATH AND CORONARY ANGIOGRAPHY;  Surgeon: [name], MD;  Location: [facility];  Service: Cardiovascular;  Laterality: N/A;   POLYPECTOMY  01/28/2020   Procedure: POLYPECTOMY;  Surgeon: [name], MD;  [facility];  Service: Endoscopy;;  colon   ROTATOR CUFF REPAIR     YAG LASER APPLICATION Right 02/22/2702   Procedure: YAG LASER APPLICATION;  Surgeon: [name], MD;  Location: [facility];  Service: Ophthalmology;  Laterality: Right;    Home Medications:  Allergies as of 08/16/2022   No Known Allergies      Medication List        Accurate as of August 16, 2022  9:21 PM. If you have any questions, ask your nurse or doctor.          Accu-Chek FastClix Lancets Misc 1 application by Does not apply route 2 (two) times  daily.   allopurinol 100 MG tablet Commonly known as: ZYLOPRIM Take 100 mg by mouth daily.   ALPRAZolam 1 MG tablet Commonly known as: XANAX Take 0.5 mg by mouth at bedtime as needed for anxiety or sleep.   DULoxetine 60 MG capsule Commonly known as: CYMBALTA Take 60 mg by mouth daily.   escitalopram 10 MG tablet Commonly known as: LEXAPRO Take 10 mg by mouth daily.   fexofenadine 180 MG tablet Commonly known as: ALLEGRA Take 180 mg by mouth daily.   finasteride 5 MG tablet Commonly known as: PROSCAR Take 1 tablet (5 mg total) by mouth daily.   gabapentin 300 MG capsule Commonly known as: NEURONTIN Take 300 mg by mouth 2 (two) times daily.   glipiZIDE 5 MG tablet Commonly known as: GLUCOTROL Take 5 mg by mouth daily.   glucose blood test strip 1 each by Other route daily before breakfast. Use as instructed   metFORMIN 500 MG 24 hr tablet Commonly known as: GLUCOPHAGE-XR TAKE TWO TABLETS ('1000MG'$  TOTAL) BY MOUTHDAILY WITH BREAKFAST   pantoprazole 40 MG tablet Commonly known as: PROTONIX TAKE ONE TABLET ('40MG'$  TOTAL) BY MOUTH TWO TIMES DAILY BEFORE A MEAL   Papav-Phentolamine-Alprostadil 150-5-50 MG-MG-MCG Solr Trimix with (PAP '30mg'$ /REG '1mg'$ /PGE1 33mg)/ml.    Injection 0.5 to 122mintracavernosally 2-3x weekly as needed   rosuvastatin 10 MG tablet Commonly known as: CRESTOR TAKE ONE TABLET ('10MG'$  TOTAL) BY MOUTH DAILY   sildenafil 50 MG tablet Commonly known as: VIAGRA Take 50 mg by mouth daily.   tadalafil 20 MG tablet Commonly known as: CIALIS Take 1 tablet (20 mg total) by mouth daily as needed for erectile dysfunction.   tamsulosin  0.4 MG Caps capsule Commonly known as: FLOMAX Take 1 capsule (0.4 mg total) by mouth 2 (two) times daily.   traMADol 50 MG tablet Commonly known as: ULTRAM Take 1 tablet (50 mg total) by mouth every 6 (six) hours as needed for moderate pain (pain.).   Vitamin D3 50 MCG (2000 UT) Tabs Take 2,000 Units by mouth daily.    warfarin 5 MG tablet Commonly known as: COUMADIN Take as directed by the anticoagulation clinic. If you are unsure how to take this medication, talk to your nurse or doctor. Original instructions: TAKE ONE TABLET BY MOUTH DAILY EXCEPT TAKE ONE AND ONE-HALF TABLETS ON SATURDAY OR AS DIRECTED        Allergies: No Known Allergies  Family History  Problem Relation Age of Onset   Prostate cancer Father        deceased   Stroke Mother        deceased   Colon cancer Neg Hx     Social History:  reports that he quit smoking about 28 years ago. His smoking use included cigarettes. He has a 45.00 pack-year smoking history. He has never used smokeless tobacco. He reports that he does not drink alcohol and does not use drugs.  ROS: A complete review of systems was performed.  All systems are negative except for pertinent findings as noted.  Physical Exam:  BP 135/67   Pulse 75   Gen: WD, WN in NAD.   Laboratory Data:    No results found for this or any previous visit (from the past 24 hour(s)).   Procedure: The penis was placed on stretch and the left lateral aspect was prepped with alcohol.  He was then injected with 0.71m of trimix with a 30g needle and pressure was held for 30 seconds will massaging the penis to distribute the med.  He had about a 75% erection at 10 minutes and will go home and monitor the results.      Impression/Assessment:  ED.  He had a good initial response with the 0.547mtrimix and will try it at home.  He was encouraged to go to the ER if the erection persists beyond 3 hours.   BPH with BOO he has stable LUTS on current therapy.   Elevated PSA.  His PSA was back up some so I will repeat that in 3 months.  Hx of horseshoe kidney with stones.   CT on 12/26/20 was ok.   He has no symptoms        Orders Placed This Encounter  Procedures   PSA, total and free    Standing Status:   Future    Standing Expiration Date:   08/17/2023   No orders of the  defined types were placed in this encounter.  Return in about 3 months (around 11/15/2022) for with PSA.   CC: Dr. LaRedmond School Patient ID: [id number] Shepherdmale   DOB: 3/[id number].o.   MRN: [id number]

## 2022-08-29 ENCOUNTER — Ambulatory Visit: Payer: Medicare Other | Attending: Orthopedic Surgery | Admitting: *Deleted

## 2022-08-29 DIAGNOSIS — Z5181 Encounter for therapeutic drug level monitoring: Secondary | ICD-10-CM | POA: Insufficient documentation

## 2022-08-29 DIAGNOSIS — I4891 Unspecified atrial fibrillation: Secondary | ICD-10-CM | POA: Diagnosis not present

## 2022-08-29 LAB — POCT INR: INR: 3.6 — AB (ref 2.0–3.0)

## 2022-08-29 NOTE — Patient Instructions (Signed)
Hold warfarin tonight then decrease dose to 1 tablet daily Recheck INR in 3 wk Call us with any new medications, changes or any bleeding concerns @ [phone].

## 2022-08-30 ENCOUNTER — Encounter: Payer: Self-pay | Admitting: Nurse Practitioner

## 2022-08-30 ENCOUNTER — Ambulatory Visit (INDEPENDENT_AMBULATORY_CARE_PROVIDER_SITE_OTHER): Payer: Medicare Other | Admitting: Nurse Practitioner

## 2022-08-30 VITALS — BP 107/62 | HR 62 | Ht 72.0 in | Wt 205.6 lb

## 2022-08-30 DIAGNOSIS — E559 Vitamin D deficiency, unspecified: Secondary | ICD-10-CM

## 2022-08-30 DIAGNOSIS — E1165 Type 2 diabetes mellitus with hyperglycemia: Secondary | ICD-10-CM | POA: Diagnosis not present

## 2022-08-30 LAB — POCT GLYCOSYLATED HEMOGLOBIN (HGB A1C): Hemoglobin A1C: 8.3 % — AB (ref 4.0–5.6)

## 2022-08-30 MED ORDER — SITAGLIPTIN PHOSPHATE 50 MG PO TABS: 50.0000 mg | ORAL_TABLET | Freq: Every day | ORAL | 1 refills | Status: DC

## 2022-08-30 MED ORDER — GLIPIZIDE 5 MG PO TABS: 5.0000 mg | ORAL_TABLET | Freq: Every day | ORAL | 1 refills | Status: DC

## 2022-08-30 NOTE — Progress Notes (Signed)
Endocrinology Follow Up Visit      08/30/2022, 2:31 PM      Subjective:    Patient ID: [name], male    DOB: 23-Jan-1941.  [name] is being seen in follow up after being seen in consultation for management of currently uncontrolled symptomatic diabetes requested by  Redmond School, MD.  He is a retired Engineer, structural for [employer].   Past Medical History:  Diagnosis Date   Aortic atherosclerosis ([place])    Barrett's esophagus    Short-segment, diagnosed in 2007 by Dr. [name]   BPH (benign prostatic hyperplasia)    CAD (coronary artery disease)    a. cath on 07/21/2019 showing patent LM and LCx with 40-60% Proximal LAD and 30-40% Pro-RCA stenosis with normal LV function.   Essential hypertension    GERD (gastroesophageal reflux disease)    Hiatal hernia    History of kidney stones    PAF (paroxysmal atrial fibrillation) ([place])    S/P colonoscopy 2007   S/P endoscopy 08/2010    Salmon-colored epithelium coming up to 37 cm from the    Type 2 diabetes mellitus ([place])    Vertigo     Past Surgical History:  Procedure Laterality Date   APPENDECTOMY     BIOPSY  01/28/2020   Procedure: BIOPSY;  Surgeon: [name], MD;  Location: [facility];  Service: Endoscopy;;  esophagus   CHOLECYSTECTOMY     COLONOSCOPY  02/06/2006   Dr. [name]- single anal papilla, o/w normal rectum, normal colon   COLONOSCOPY N/A 09/13/2016   RMR: three 5-6 mm for adenomas removed.  Recommend one last surveillance colonoscopy in October 2020.   COLONOSCOPY WITH PROPOFOL N/A 01/28/2020   Procedure: COLONOSCOPY WITH PROPOFOL;  Surgeon: [name], MD;  Location: [facility];  Service: Endoscopy;  Laterality: N/A;  10:45am   CYSTOSCOPY WITH RETROGRADE PYELOGRAM, URETEROSCOPY AND STENT PLACEMENT Right 08/18/2020   Procedure: CYSTOSCOPY WITH INCISION OF RIGHT URETEROCELE, URETEROSCOPY WITH  HOLMIUM LASER, CYSTOLITHOLAPAXY OF 10 MM BLADDER STONE;  Surgeon: [name], MD;  Location: [facility];  Service: Urology;  Laterality: Right;   ESOPHAGOGASTRODUODENOSCOPY  09/15/2010   Dr. [name]- Salmon-colored epithelium coming up to 37 cm from the    ESOPHAGOGASTRODUODENOSCOPY N/A 07/02/2013   Dr. [name]- Barretts on bx. hiatal hernia   ESOPHAGOGASTRODUODENOSCOPY N/A 09/13/2016   RMR: Barrett's esophagus without dysplasia. one last EGD in 3 years.    ESOPHAGOGASTRODUODENOSCOPY (EGD) WITH PROPOFOL N/A 01/28/2020   Procedure: ESOPHAGOGASTRODUODENOSCOPY (EGD) WITH PROPOFOL;  Surgeon: [name], MD;  [facility];  Service: Endoscopy;  Laterality: N/A;   INTRAVASCULAR PRESSURE WIRE/FFR STUDY N/A 07/21/2019   Procedure: INTRAVASCULAR PRESSURE WIRE/FFR STUDY;  Surgeon: [name], MD;  Location: [facility];  Service: Cardiovascular;  Laterality: N/A;   LEFT HEART CATH AND CORONARY ANGIOGRAPHY N/A 07/21/2019   Procedure: LEFT HEART CATH AND CORONARY ANGIOGRAPHY;  Surgeon: [name], MD;  Location: [facility];  Service: Cardiovascular;  Laterality: N/A;   POLYPECTOMY  01/28/2020   Procedure: POLYPECTOMY;  Surgeon: [name], MD;  [facility];  Service: Endoscopy;;  colon   ROTATOR CUFF REPAIR     YAG LASER APPLICATION Right 11/24/1759   Procedure: YAG LASER APPLICATION;  Surgeon: [name], MD;  Location: [facility];  Service: Ophthalmology;  Laterality: Right;    Social History   Socioeconomic History   Marital status: Married    Spouse name: Not on file   Number of children: Not on file   Years of education: Not on file   Highest education level: Not on file  Occupational History   Not on file  Tobacco Use   Smoking status: Former    Packs/day: 1.50    Years: 30.00    Total pack years: 45.00    Types: Cigarettes    Quit date: 11/18/1993    Years since quitting: 28.8   Smokeless tobacco: Never   Tobacco comments:    quit in the  90's  Vaping Use   Vaping Use: Never used  Substance and Sexual Activity   Alcohol use: No   Drug use: No   Sexual activity: Not on file  Other Topics Concern   Not on file  Social History Narrative   Not on file   Social Determinants of Health   Financial Resource Strain: Not on file  Food Insecurity: Not on file  Transportation Needs: Not on file  Physical Activity: Not on file  Stress: Not on file  Social Connections: Not on file    Family History  Problem Relation Age of Onset   Prostate cancer Father        deceased   Stroke Mother        deceased   Colon cancer Neg Hx     Outpatient Encounter Medications as of 08/30/2022  Medication Sig   Accu-Chek FastClix Lancets MISC 1 application by Does not apply route 2 (two) times daily.   allopurinol (ZYLOPRIM) 100 MG tablet Take 100 mg by mouth daily.    ALPRAZolam (XANAX) 1 MG tablet Take 0.5 mg by mouth at bedtime as needed for anxiety or sleep.   Cholecalciferol (VITAMIN D3) 50 MCG (2000 UT) TABS Take 2,000 Units by mouth daily.   DULoxetine (CYMBALTA) 60 MG capsule Take 60 mg by mouth daily.   escitalopram (LEXAPRO) 10 MG tablet Take 10 mg by mouth daily.   fexofenadine (ALLEGRA) 180 MG tablet Take 180 mg by mouth daily.   finasteride (PROSCAR) 5 MG tablet Take 1 tablet (5 mg total) by mouth daily.   gabapentin (NEURONTIN) 300 MG capsule Take 300 mg by mouth 2 (two) times daily.    glucose blood test strip 1 each by Other route daily before breakfast. Use as instructed   metFORMIN (GLUCOPHAGE-XR) 500 MG 24 hr tablet TAKE TWO TABLETS ('1000MG'$  TOTAL) BY MOUTHDAILY WITH BREAKFAST   pantoprazole (PROTONIX) 40 MG tablet TAKE ONE TABLET ('40MG'$  TOTAL) BY MOUTH TWO TIMES DAILY BEFORE A MEAL   Papav-Phentolamine-Alprostadil 150-5-50 MG-MG-MCG SOLR Trimix with (PAP '30mg'$ /REG '1mg'$ /PGE1 82mg)/ml.    Injection 0.5 to 133mintracavernosally 2-3x weekly as needed   rosuvastatin (CRESTOR) 10 MG tablet TAKE ONE TABLET ('10MG'$  TOTAL) BY MOUTH  DAILY   sildenafil (VIAGRA) 50 MG tablet Take 50 mg by mouth daily.   sitaGLIPtin (JANUVIA) 50 MG tablet Take 1 tablet (50 mg total) by mouth daily.   tadalafil (CIALIS) 20 MG tablet Take 1 tablet (20 mg total) by mouth daily as needed for erectile dysfunction.   tamsulosin (FLOMAX) 0.4 MG CAPS capsule Take 1 capsule (  0.4 mg total) by mouth 2 (two) times daily.   traMADol (ULTRAM) 50 MG tablet Take 1 tablet (50 mg total) by mouth every 6 (six) hours as needed for moderate pain (pain.).   warfarin (COUMADIN) 5 MG tablet TAKE ONE TABLET BY MOUTH DAILY EXCEPT TAKE ONE AND ONE-HALF TABLETS ON SATURDAY OR AS DIRECTED   [DISCONTINUED] glipiZIDE (GLUCOTROL) 5 MG tablet Take 5 mg by mouth daily.   glipiZIDE (GLUCOTROL) 5 MG tablet Take 1 tablet (5 mg total) by mouth daily.   No facility-administered encounter medications on file as of 08/30/2022.    ALLERGIES: No Known Allergies  VACCINATION STATUS: Immunization History  Administered Date(s) Administered   Influenza-Unspecified 09/19/2013    Diabetes He presents for his follow-up diabetic visit. He has type 2 diabetes mellitus. Onset time: diagnosed at approximate age of 81. His disease course has been stable. There are no hypoglycemic associated symptoms. Pertinent negatives for hypoglycemia include no confusion, headaches, seizures or tremors. Associated symptoms include fatigue and foot paresthesias. Pertinent negatives for diabetes include no chest pain, no foot ulcerations, no polydipsia, no polyuria, no visual change and no weight loss. There are no hypoglycemic complications. Symptoms are stable. Diabetic complications include heart disease and peripheral neuropathy. Risk factors for coronary artery disease include diabetes mellitus, dyslipidemia, hypertension, male sex, obesity and sedentary lifestyle. Current diabetic treatment includes oral agent (dual therapy). He is compliant with treatment most of the time. His weight is fluctuating  minimally. He is following a generally unhealthy diet. When asked about meal planning, he reported none. He has not had a previous visit with a dietitian. He rarely participates in exercise. His overall blood glucose range is >200 mg/dl. (He presents today with his meter, no logs, showing above target glycemic profile overall.  His POCT A1c today is 8.3%, increasing from last visit of 7.9%.  He admits he has not been monitoring glucose as frequent as I asked, and has also indulged in sweetened foods.  He finds himself not sleeping and snacks during those times.  Analysis of his meter shows 7-day average of 279 with 6 readings; and 28-day average of 258 with 8 readings.) An ACE inhibitor/angiotensin II receptor blocker is not being taken. He does not see a podiatrist.Eye exam is current.    Review of systems  Constitutional: + steadily decreasing body weight,  current Body mass index is 27.88 kg/m. , ++ fatigue, no subjective hyperthermia, no subjective hypothermia Eyes: no blurry vision, no xerophthalmia ENT: no sore throat, no nodules palpated in throat, no dysphagia/odynophagia, no hoarseness Cardiovascular: no chest pain, no shortness of breath, no palpitations, no leg swelling Respiratory: no cough, no shortness of breath Gastrointestinal: no nausea/vomiting/diarrhea Musculoskeletal: no muscle/joint aches Skin: no rashes, no hyperemia Neurological: no tremors, + numbness/tingling to BLE, no dizziness Psychiatric: no depression, no anxiety, + insomnia   Objective:    BP 107/62 (BP Location: Right Arm, Patient Position: Sitting, Cuff Size: Large)   Pulse 62   Ht 6' (1.829 m)   Wt 205 lb 9.6 oz (93.3 kg)   BMI 27.88 kg/m   Wt Readings from Last 3 Encounters:  08/30/22 205 lb 9.6 oz (93.3 kg)  06/29/22 213 lb 3.2 oz (96.7 kg)  05/30/22 214 lb (97.1 kg)    BP Readings from Last 3 Encounters:  08/30/22 107/62  08/16/22 135/67  07/26/22 139/71     Physical Exam-  Limited  Constitutional:  Body mass index is 27.88 kg/m. , not in acute distress, normal state  of mind Eyes:  EOMI, no exophthalmos Neck: Supple Cardiovascular: RRR, no murmurs, rubs, or gallops, no edema Respiratory: Adequate breathing efforts, no crackles, rales, rhonchi, or wheezing Musculoskeletal: no gross deformities, strength intact in all four extremities, no gross restriction of joint movements Skin:  no rashes, no hyperemia Neurological: no tremor with outstretched hands  Diabetic Foot Exam - Simple   Simple Foot Form Diabetic Foot exam was performed with the following findings: Yes 08/30/2022  2:27 PM  Visual Inspection No deformities, no ulcerations, no other skin breakdown bilaterally: Yes Sensation Testing Intact to touch and monofilament testing bilaterally: Yes See comments: Yes Pulse Check Posterior Tibialis and Dorsalis pulse intact bilaterally: Yes Comments Decreased sensation to monofilament tool bilaterally     CMP ( most recent) CMP     Component Value Date/Time   NA 139 05/25/2022 1316   K 4.0 05/25/2022 1316   CL 103 05/25/2022 1316   CO2 22 05/25/2022 1316   GLUCOSE 298 (H) 05/25/2022 1316   GLUCOSE 202 (H) 04/01/2022 2150   BUN 15 05/25/2022 1316   CREATININE 1.05 05/25/2022 1316   CREATININE 1.16 07/03/2019 1343   CALCIUM 9.0 05/25/2022 1316   PROT 5.9 (L) 05/25/2022 1316   ALBUMIN 3.8 05/25/2022 1316   AST 17 05/25/2022 1316   ALT 15 05/25/2022 1316   ALKPHOS 69 05/25/2022 1316   BILITOT 0.6 05/25/2022 1316   GFRNONAA >60 04/01/2022 2150   GFRAA >60 08/12/2020 1336     Diabetic Labs (most recent): Lab Results  Component Value Date   HGBA1C 8.3 (A) 08/30/2022   HGBA1C 7.9 (H) 04/01/2022   HGBA1C 8.3 (A) 02/21/2022   MICROALBUR 10 02/21/2022   MICROALBUR 30 02/23/2021     Lipid Panel ( most recent) Lipid Panel  No results found for: "CHOL", "TRIG", "HDL", "CHOLHDL", "VLDL", "LDLCALC", "LDLDIRECT", "LABVLDL"    Lab Results   Component Value Date   TSH 2.420 05/25/2022   TSH 3.235 05/02/2018   FREET4 1.10 05/25/2022         Assessment & Plan:   1) Type 2 diabetes mellitus with hyperglycemia, without long-term current use of insulin ([place])  He presents today with his meter, no logs, showing above target glycemic profile overall.  His POCT A1c today is 8.3%, increasing from last visit of 7.9%.  He admits he has not been monitoring glucose as frequent as I asked, and has also indulged in sweetened foods.  He finds himself not sleeping and snacks during those times.  Analysis of his meter shows 7-day average of 279 with 6 readings; and 28-day average of 258 with 8 readings.  - [name] has currently uncontrolled symptomatic type 2 DM since 81 years of age.   Recent labs reviewed.  - I had a long discussion with him about the progressive nature of diabetes and the pathology behind its complications.  -his diabetes is complicated by CAD, peripheral neuropathy and he remains at a high risk for more acute and chronic complications which include CAD, CVA, CKD, retinopathy, and neuropathy. These are all discussed in detail with him.  - Nutritional counseling repeated at each appointment due to patients tendency to fall back in to old habits.  - The patient admits there is a room for improvement in their diet and drink choices. -  Suggestion is made for the patient to avoid simple carbohydrates from their diet including Cakes, Sweet Desserts / Pastries, Ice Cream, Soda (diet and regular), Sweet Tea, Candies, Chips, Cookies, Sweet  Pastries, Store Bought Juices, Alcohol in Excess of 1-2 drinks a day, Artificial Sweeteners, Coffee Creamer, and "Sugar-free" Products. This will help patient to have stable blood glucose profile and potentially avoid unintended weight gain.   - I encouraged the patient to switch to unprocessed or minimally processed complex starch and increased protein intake (animal or plant source),  fruits, and vegetables.   - Patient is advised to stick to a routine mealtimes to eat 3 meals a day and avoid unnecessary snacks (to snack only to correct hypoglycemia).  - I have approached him with the following individualized plan to manage  his diabetes and patient agrees:   -He is advised to continue his Metformin 1000 mg ER daily with breakfast and continue Glipizide 5 mg po daily with breakfast.  I discussed and initiated trial of Januvia 50 mg po daily to help with postprandial spikes in glucose.  He wishes to stay away from injectables at this time.  -He is encouraged to start consistently monitoring blood glucose at least once daily, before breakfast and to notify the clinic if his glucose readings are less than 70 or above 200 for 3 tests in a row.  - Specific targets for  A1c;  LDL, HDL,  and Triglycerides were discussed with the patient.  2) Blood Pressure /Hypertension:  His blood pressure is controlled to target without the use of antihypertensive medications.  3) Lipids/Hyperlipidemia:  There are no recent lipid panel results to review, nor does he take any medication for high cholesterol.  He reports he has had his cholesterol checked at his PCP, although no records have been sent to [place].   4)  Weight/Diet:  His Body mass index is 78.58 kg/m.  - complicating his diabetes care.   he is a candidate for some weight loss. I discussed with him the fact that loss of 5 - 10% of his  current body weight will have the most impact on his diabetes management.  Exercise, and detailed carbohydrates information provided  -  detailed on discharge instructions.  5) Chronic Care/Health Maintenance: -he is encouraged to initiate and continue to follow up with Ophthalmology, Dentist,  Podiatrist at least yearly or according to recommendations, and advised to stay away from smoking and/or secondhand smoke exposure. I have recommended yearly flu vaccine and pneumonia vaccine at least every 5 years;  moderate intensity exercise for up to 150 minutes weekly; and  sleep for at least 7 hours a day.    - he is advised to maintain close follow up with Redmond School, MD for primary care needs, as well as his other providers for optimal and coordinated care.       I spent 42 minutes in the care of the patient today including review of labs from [place], Lipids, Thyroid Function, Hematology (current and previous including abstractions from other facilities); face-to-face time discussing  his blood glucose readings/logs, discussing hypoglycemia and hyperglycemia episodes and symptoms, medications doses, his options of short and long term treatment based on the latest standards of care / guidelines;  discussion about incorporating lifestyle medicine;  and documenting the encounter. Risk reduction counseling performed per USPSTF guidelines to reduce obesity and cardiovascular risk factors.     Please refer to Patient Instructions for Blood Glucose Monitoring and Insulin/Medications Dosing Guide"  in media tab for additional information. Please  also refer to " Patient Self Inventory" in the Media  tab for reviewed elements of pertinent patient history.  [name] participated in the  discussions, expressed understanding, and voiced agreement with the above plans.  All questions were answered to his satisfaction. he is encouraged to contact clinic should he have any questions or concerns prior to his return visit.    Follow up plan: - Return in about 3 months (around 11/30/2022) for Diabetes F/U with A1c in office, Bring meter and logs, Previsit labs.  Rayetta Pigg, [facility] [address] [place], [place] [address] Phone: [phone] Fax: [phone]  08/30/2022, 2:31 PM

## 2022-08-31 DIAGNOSIS — F419 Anxiety disorder, unspecified: Secondary | ICD-10-CM | POA: Diagnosis not present

## 2022-08-31 DIAGNOSIS — Z6827 Body mass index (BMI) 27.0-27.9, adult: Secondary | ICD-10-CM | POA: Diagnosis not present

## 2022-08-31 DIAGNOSIS — E663 Overweight: Secondary | ICD-10-CM | POA: Diagnosis not present

## 2022-08-31 DIAGNOSIS — Z23 Encounter for immunization: Secondary | ICD-10-CM | POA: Diagnosis not present

## 2022-09-03 ENCOUNTER — Telehealth: Payer: Self-pay | Admitting: Nurse Practitioner

## 2022-09-03 ENCOUNTER — Other Ambulatory Visit: Payer: Medicare Other

## 2022-09-03 NOTE — Telephone Encounter (Signed)
Patient left a VM stating the day he took his new medication (Januvia 50 mg )that you prescribed him, later that evening his sugar dropped very. He is asking for a call back.

## 2022-09-04 NOTE — Telephone Encounter (Signed)
Does he have readings?

## 2022-09-04 NOTE — Telephone Encounter (Signed)
Ok, sounds good.

## 2022-09-19 ENCOUNTER — Ambulatory Visit: Payer: Medicare Other | Attending: Orthopedic Surgery | Admitting: *Deleted

## 2022-09-19 DIAGNOSIS — I4891 Unspecified atrial fibrillation: Secondary | ICD-10-CM | POA: Insufficient documentation

## 2022-09-19 DIAGNOSIS — Z5181 Encounter for therapeutic drug level monitoring: Secondary | ICD-10-CM | POA: Insufficient documentation

## 2022-09-19 LAB — POCT INR: INR: 2 (ref 2.0–3.0)

## 2022-09-19 NOTE — Patient Instructions (Signed)
Continue warfarin 1 tablet daily Recheck INR in 4 wk Call us with any new medications, changes or any bleeding concerns @ [phone].

## 2022-09-20 ENCOUNTER — Encounter: Payer: Self-pay | Admitting: Urology

## 2022-09-20 ENCOUNTER — Ambulatory Visit (INDEPENDENT_AMBULATORY_CARE_PROVIDER_SITE_OTHER): Payer: Medicare Other | Admitting: Urology

## 2022-09-20 VITALS — BP 105/61 | HR 69

## 2022-09-20 DIAGNOSIS — N5201 Erectile dysfunction due to arterial insufficiency: Secondary | ICD-10-CM | POA: Diagnosis not present

## 2022-09-20 DIAGNOSIS — R351 Nocturia: Secondary | ICD-10-CM

## 2022-09-20 DIAGNOSIS — N401 Enlarged prostate with lower urinary tract symptoms: Secondary | ICD-10-CM | POA: Diagnosis not present

## 2022-09-20 DIAGNOSIS — N138 Other obstructive and reflux uropathy: Secondary | ICD-10-CM | POA: Diagnosis not present

## 2022-09-20 DIAGNOSIS — I2510 Atherosclerotic heart disease of native coronary artery without angina pectoris: Secondary | ICD-10-CM

## 2022-09-20 LAB — URINALYSIS, ROUTINE W REFLEX MICROSCOPIC
Bilirubin, UA: NEGATIVE
Glucose, UA: NEGATIVE
Ketones, UA: NEGATIVE
Leukocytes,UA: NEGATIVE
Nitrite, UA: NEGATIVE
Protein,UA: NEGATIVE
Specific Gravity, UA: 1.025 (ref 1.005–1.030)
Urobilinogen, Ur: 1 mg/dL (ref 0.2–1.0)
pH, UA: 5.5 (ref 5.0–7.5)

## 2022-09-20 LAB — MICROSCOPIC EXAMINATION
Bacteria, UA: NONE SEEN
WBC, UA: NONE SEEN /hpf (ref 0–5)

## 2022-09-20 MED ORDER — PAPAV-PHENTOLAMINE-ALPROSTADIL 150-10-100 MG-MG-MCG IC SOLR: 1.0000 mL | Freq: Every day | INTRACAVERNOUS | 11 refills | Status: DC | PRN

## 2022-09-20 MED ORDER — SILODOSIN 8 MG PO CAPS: 8.0000 mg | ORAL_CAPSULE | Freq: Every day | ORAL | 11 refills | Status: DC

## 2022-09-20 NOTE — Progress Notes (Signed)
H&P 1. Erectile dysfunction due to arterial insufficiency   2. BPH with urinary obstruction   3. Nocturia      Chief Complaint: LUTS  History of Present Illness:   09/19/22: [name] returns today in f/u.  He has ED that has failed oral meds and was given a trimix test injection at his last visit with a partial response.  He reports that at home with a full syringe he still only gets a partial erection.   He is having some increased nocturia up to 5x with urgency.  He remains on tamsulosin or finasteride which was added last year.  He has some hesitancy.  HE has no hematuria or dysuria.  He has some mild discomfort in the left groin.  His prostate volume is about 73m on his CT in 2/22.  The UA today is clear.    08/16/22:[name] Greekreturns today for a penile test injection with trimix.   His PSA was up to 5.2 from 3.2 at his last visit.  He is on finasteride.    07/26/22:[name] Greekreturns today in f/u.  He was supposed to have a PSA prior to this visit but I don't see that was done.  He had the tadalafil increased to '20mg'$  at his last visit but has had an insufficient response.  He gets a partial erection but it is not sufficient for penetration and has difficulty maintaining it.  He has reduced sensation of orgasm and minimal fluid.     06/07/22:[name] Greekreturns today in f/u.  He has some progressive ED.  He didn't respond to tadalafil '5mg'$  or sildenafil.  He remains of finasteride and tamsulosin.  His IPSS is 17 with a reduced stream, intermittency and urgency.  He has nocturia x 2.  He has had no flank pain or hematuria.  He has a history of stones and a horseshoe kidney.  He remains on warfarin.   02/01/22:[name] Greekreturns today in f/u.  His PSA was down to 3.2 on 08/10/21 from 6.3 on 12/04/19.  He reports malodorous urine but no hematuria or dysuria.  He remains on finasteride and tamsulosin 0.'8mg'$ .  The nocturia is down to 1-2x.  He has a decent stream during the day.  He has had no flank pain but has a  history of stones in a horseshoe kidney.  He has ED and has tadalafil and sildenafil but hasn't had much response.  The tadalafil was expensive.   08/10/21: [name] returns today in f/u.  He is current on finasteride and tamsulosin 0.8 mg daily for his BPH with BOO and he reports improvement in his symptoms with the addition of the finasteride.  His IPSS is down to 23 from 30.  He has a good stream in the day that is slow at night.  He has nocturia x 3.  He has intermittency at night.  He has no incontinence.  He has had no hematuria or flank pain.  He can get a minimal partial erection and is inquiring about options.    02/02/21: [name] today in f/u.  He was given doxycyline on 11/04/20 for possible diverticulitis.   He had the CT I ordered on 12/26/20 and it showed diverticular disease without diverticulitis.  He has a horseshoe kidney with no residual stone disease or obstruction.  He has fairly severe LUTS with an IPSS of 30.  His urgency, frequency and intermittency.  He has a reduced stream at night and nocturia x 3.  He remains  on tamsulosin.  He feels generally poorly today but has no fever.  He has no hematuria or dysuria.   12/17/21[name] returns today with a 1 week history of LLQ pain and hesitancy with frequent small voids.  He is not sleeping well because of the pain.  He has a history of stones and prostatitis.  He UA has 0-2 RBC's.  He had the Covid booster a week ago and had fever with that.  He has had no nausea.  He has a horseshoe kidney that had no left stones on a CT in 9/21.  He did have diverticular disease.   09/02/20: [name] returns today in f/u from recent right ureteral stone extraction.  The stone was impacted and I had to incise the ureteral meatus to release the stone and then fragment it to get it out of the bladder.   He has no hematuria.   He has back pain but no flank pain .  He is voiding well.  His IPSS is 21.  07/01/20: [name] returns today in f/u.  He reports  resolution of the penile lesion.   He continues to have nocturia but he is getting up a lot with his wife who has to get up.   He remains on tamsulosin.  His IPSS is 24.  He has been having some progressive SOB and Dr. Melvyn [name] had some questions about whether the tamsulosin may be contributing to the breathing issues.   His PSA in 1/21 was 6.3 which was down from 9.7.  He has some LLQ discomfort at times.  He has a horseshoe kidney and a right renal stone on a CT in 2015.   3.23.2021: Most recent PSA was 6.3 on 1.5.2021. He presents today c/o of sore spot underneath his penis that he has had for the last 2 wks. This has been too painful to touch. He has tried managing pain with medications and hot baths without success. He has also tried to "pop" it without producing any drainage. This pain has started to lessen in severity but he has recently begun having some mild dysuria and increased nocturia. Otherwise, his urinary sx's are stable.      01/27/19 10/23/18 10/17/17 09/18/16 09/06/15 03/02/15 04/03/14 06/13/12  PSA  Total PSA 6.4 ng/dl 9.7 ng/dl 5.3 ng/dl 5.8  4.66  5.64  5.20  4.36   Free PSA    1.0  0.56  0.58  0.64  0.62   % Free PSA  8 %  '17  12  10  12  14       '$ ROS: Otherwise negative.    Past Medical History:  Diagnosis Date   Aortic atherosclerosis ([place])    Barrett's esophagus    Short-segment, diagnosed in 2007 by Dr. [name]   BPH (benign prostatic hyperplasia)    CAD (coronary artery disease)    a. cath on 07/21/2019 showing patent LM and LCx with 40-60% Proximal LAD and 30-40% Pro-RCA stenosis with normal LV function.   Essential hypertension    GERD (gastroesophageal reflux disease)    Hiatal hernia    History of kidney stones    PAF (paroxysmal atrial fibrillation) ([place])    S/P colonoscopy 2007   S/P endoscopy 08/2010    Salmon-colored epithelium coming up to 37 cm from the    Type 2 diabetes mellitus ([place])    Vertigo     Past Surgical History:  Procedure Laterality  Date   APPENDECTOMY     BIOPSY  01/28/2020   Procedure: BIOPSY;  Surgeon: [name], MD;  [facility];  Service: Endoscopy;;  esophagus   CHOLECYSTECTOMY     COLONOSCOPY  02/06/2006   Dr. [name]- single anal papilla, o/w normal rectum, normal colon   COLONOSCOPY N/A 09/13/2016   RMR: three 5-6 mm for adenomas removed.  Recommend one last surveillance colonoscopy in October 2020.   COLONOSCOPY WITH PROPOFOL N/A 01/28/2020   Procedure: COLONOSCOPY WITH PROPOFOL;  Surgeon: [name], MD;  [facility];  Service: Endoscopy;  Laterality: N/A;  10:45am   CYSTOSCOPY WITH RETROGRADE PYELOGRAM, URETEROSCOPY AND STENT PLACEMENT Right 08/18/2020   Procedure: CYSTOSCOPY WITH INCISION OF RIGHT URETEROCELE, URETEROSCOPY WITH HOLMIUM LASER, CYSTOLITHOLAPAXY OF 10 MM BLADDER STONE;  Surgeon: [name], MD;  [facility];  Service: Urology;  Laterality: Right;   ESOPHAGOGASTRODUODENOSCOPY  09/15/2010   Dr. [name]- Salmon-colored epithelium coming up to 37 cm from the    ESOPHAGOGASTRODUODENOSCOPY N/A 07/02/2013   Dr. [name]- Barretts on bx. hiatal hernia   ESOPHAGOGASTRODUODENOSCOPY N/A 09/13/2016   RMR: Barrett's esophagus without dysplasia. one last EGD in 3 years.    ESOPHAGOGASTRODUODENOSCOPY (EGD) WITH PROPOFOL N/A 01/28/2020   Procedure: ESOPHAGOGASTRODUODENOSCOPY (EGD) WITH PROPOFOL;  Surgeon: [name], MD;  [facility];  Service: Endoscopy;  Laterality: N/A;   INTRAVASCULAR PRESSURE WIRE/FFR STUDY N/A 07/21/2019   Procedure: INTRAVASCULAR PRESSURE WIRE/FFR STUDY;  Surgeon: [name], MD;  Location: [facility];  Service: Cardiovascular;  Laterality: N/A;   LEFT HEART CATH AND CORONARY ANGIOGRAPHY N/A 07/21/2019   Procedure: LEFT HEART CATH AND CORONARY ANGIOGRAPHY;  Surgeon: [name], MD;  Location: [facility];  Service: Cardiovascular;  Laterality: N/A;   POLYPECTOMY  01/28/2020   Procedure: POLYPECTOMY;  Surgeon: [name], MD;  [facility];  Service: Endoscopy;;  colon   ROTATOR CUFF REPAIR     YAG LASER APPLICATION Right 05/25/8241   Procedure: YAG LASER APPLICATION;  Surgeon: [name], MD;  [facility];  Service: Ophthalmology;  Laterality: Right;    Home Medications:  Allergies as of 09/20/2022   No Known Allergies      Medication List        Accurate as of September 20, 2022 11:59 PM. If you have any questions, ask your nurse or doctor.          STOP taking these medications    Papav-Phentolamine-Alprostadil 150-5-50 MG-MG-MCG Solr Replaced by: Papav-Phentolamine-Alprostadil 150-10-100 MG-MG-MCG Solr Stopped by: [name], MD   tamsulosin 0.4 MG Caps capsule Commonly known as: FLOMAX Stopped by: [name], MD       TAKE these medications    Accu-Chek FastClix Lancets Misc 1 application by Does not apply route 2 (two) times daily.   allopurinol 100 MG tablet Commonly known as: ZYLOPRIM Take 100 mg by mouth daily.   ALPRAZolam 1 MG tablet Commonly known as: XANAX Take 0.5 mg by mouth at bedtime as needed for anxiety or sleep.   DULoxetine 60 MG capsule Commonly known as: CYMBALTA Take 60 mg by mouth daily.   escitalopram 10 MG tablet Commonly known as: LEXAPRO Take 10 mg by mouth daily.   fexofenadine 180 MG tablet Commonly known as: ALLEGRA Take 180 mg by mouth daily.   finasteride 5 MG tablet Commonly known as: PROSCAR Take 1 tablet (5 mg total) by mouth daily.   gabapentin 300 MG capsule Commonly known as: NEURONTIN Take 300 mg  by mouth 2 (two) times daily.   glucose blood test strip 1 each by Other route daily before breakfast. Use as instructed   metFORMIN 500 MG 24 hr tablet Commonly known as: GLUCOPHAGE-XR TAKE TWO TABLETS ('1000MG'$  TOTAL) BY MOUTHDAILY WITH BREAKFAST   pantoprazole 40 MG tablet Commonly known as: PROTONIX TAKE ONE TABLET ('40MG'$  TOTAL) BY MOUTH TWO TIMES DAILY BEFORE A MEAL   Papav-Phentolamine-Alprostadil  150-10-100 MG-MG-MCG Solr 1 mL by Intracavernosal route daily as needed (erectile dysfunction). PAP '30mg'$ / REG '2mg'$ / PGE1 63mg /ml.  Inject 175mprn no more than 2-3x weekly Replaces: Papav-Phentolamine-Alprostadil 150-5-50 MG-MG-MCG Solr Started by: JoIrine SealMD   rosuvastatin 10 MG tablet Commonly known as: CRESTOR TAKE ONE TABLET ('10MG'$  TOTAL) BY MOUTH DAILY   sildenafil 50 MG tablet Commonly known as: VIAGRA Take 50 mg by mouth daily.   silodosin 8 MG Caps capsule Commonly known as: RAPAFLO Take 1 capsule (8 mg total) by mouth daily with breakfast. Started by: JoIrine SealMD   sitaGLIPtin 50 MG tablet Commonly known as: Januvia Take 1 tablet (50 mg total) by mouth daily.   tadalafil 20 MG tablet Commonly known as: CIALIS Take 1 tablet (20 mg total) by mouth daily as needed for erectile dysfunction.   traMADol 50 MG tablet Commonly known as: ULTRAM Take 1 tablet (50 mg total) by mouth every 6 (six) hours as needed for moderate pain (pain.).   Vitamin D3 50 MCG (2000 UT) Tabs Take 2,000 Units by mouth daily.   warfarin 5 MG tablet Commonly known as: COUMADIN Take as directed by the anticoagulation clinic. If you are unsure how to take this medication, talk to your nurse or doctor. Original instructions: TAKE ONE TABLET BY MOUTH DAILY EXCEPT TAKE ONE AND ONE-HALF TABLETS ON SATURDAY OR AS DIRECTED        Allergies: No Known Allergies  Family History  Problem Relation Age of Onset   Prostate cancer Father        deceased   Stroke Mother        deceased   Colon cancer Neg Hx     Social History:  reports that he quit smoking about 28 years ago. His smoking use included cigarettes. He has a 45.00 pack-year smoking history. He has never used smokeless tobacco. He reports that he does not drink alcohol and does not use drugs.  ROS: A complete review of systems was performed.  All systems are negative except for pertinent findings as noted.  Physical Exam:  BP  105/61   Pulse 69   Gen: WD, WN in NAD.   Laboratory Data:    Results for orders placed or performed in visit on 09/20/22 (from the past 24 hour(s))  Urinalysis, Routine w reflex microscopic     Status: Abnormal   Collection Time: 09/20/22  9:47 AM  Result Value Ref Range   Specific Gravity, UA 1.025 1.005 - 1.030   pH, UA 5.5 5.0 - 7.5   Color, UA Yellow Yellow   Appearance Ur Clear Clear   Leukocytes,UA Negative Negative   Protein,UA Negative Negative/Trace   Glucose, UA Negative Negative   Ketones, UA Negative Negative   RBC, UA Trace (A) Negative   Bilirubin, UA Negative Negative   Urobilinogen, Ur 1.0 0.2 - 1.0 mg/dL   Nitrite, UA Negative Negative   Microscopic Examination See below:    Narrative   Performed at:  [id number] Director: LoDory Larsen  [place], Phone:  [phone]  Microscopic Examination     Status: None   Collection Time: 09/20/22  9:47 AM   Urine  Result Value Ref Range   WBC, UA None seen 0 - 5 /hpf   RBC, Urine 0-2 0 - 2 /hpf   Epithelial Cells (non renal) 0-10 0 - 10 /hpf   Bacteria, UA None seen None seen/Few   Narrative   Performed at:  [facility] [address], [place], [place]  [id number] Lab Director: [place], Phone:  [phone]         Impression/Assessment:  ED.  He had a good initial response with the 0.71m trimix but didn't get a complete erection with the 161m  I have sent a new script with an increased dose and if that doesn't work we can try quadmix.   BPH with BOO he has increased nocturia and hesitancy on current therapy.  I will continue the finasteride but change him from tamsulosin to silodosin and will have him do a flowrate and PVR on return.  Elevated PSA.  He will have that repeated prior to f/u in December.   Hx of horseshoe kidney with stones.   CT on 12/26/20 was ok.   He has no symptoms and his UA is clear.        Orders  Placed This Encounter  Procedures   Microscopic Examination   Urinalysis, Routine w reflex microscopic   Meds ordered this encounter  Medications   Papav-Phentolamine-Alprostadil 150-10-100 MG-MG-MCG SOLR    Sig: 1 mL by Intracavernosal route daily as needed (erectile dysfunction). PAP '30mg'$ / REG '2mg'$ / PGE1 4040m/ml.  Inject 1ml65mn no more than 2-3x weekly    Dispense:  5 each    Refill:  11   silodosin (RAPAFLO) 8 MG CAPS capsule    Sig: Take 1 capsule (8 mg total) by mouth daily with breakfast.    Dispense:  30 capsule    Refill:  11   Return for Keep December appt with PSA and do flowrate and PVR. .   Marland KitchenC: Dr. LawrRedmond Schoolatient ID: DwigDaphane Shepherdle   DOB: 01/2305-16-1942 y46.   MRN: [id number]

## 2022-10-08 DIAGNOSIS — U071 COVID-19: Secondary | ICD-10-CM | POA: Diagnosis not present

## 2022-10-08 DIAGNOSIS — E663 Overweight: Secondary | ICD-10-CM | POA: Diagnosis not present

## 2022-10-08 DIAGNOSIS — I480 Paroxysmal atrial fibrillation: Secondary | ICD-10-CM | POA: Diagnosis not present

## 2022-10-08 DIAGNOSIS — Z6827 Body mass index (BMI) 27.0-27.9, adult: Secondary | ICD-10-CM | POA: Diagnosis not present

## 2022-10-17 ENCOUNTER — Ambulatory Visit: Payer: Medicare Other | Attending: Cardiology

## 2022-10-19 DIAGNOSIS — Z6827 Body mass index (BMI) 27.0-27.9, adult: Secondary | ICD-10-CM | POA: Diagnosis not present

## 2022-10-19 DIAGNOSIS — E663 Overweight: Secondary | ICD-10-CM | POA: Diagnosis not present

## 2022-10-19 DIAGNOSIS — I480 Paroxysmal atrial fibrillation: Secondary | ICD-10-CM | POA: Diagnosis not present

## 2022-10-19 DIAGNOSIS — J069 Acute upper respiratory infection, unspecified: Secondary | ICD-10-CM | POA: Diagnosis not present

## 2022-10-22 ENCOUNTER — Ambulatory Visit: Payer: Medicare Other | Attending: Orthopedic Surgery | Admitting: Cardiology

## 2022-10-22 ENCOUNTER — Encounter: Payer: Self-pay | Admitting: Cardiology

## 2022-10-22 ENCOUNTER — Ambulatory Visit (INDEPENDENT_AMBULATORY_CARE_PROVIDER_SITE_OTHER): Payer: Medicare Other | Admitting: *Deleted

## 2022-10-22 VITALS — BP 120/70 | HR 80 | Ht 69.0 in | Wt 202.4 lb

## 2022-10-22 DIAGNOSIS — Z5181 Encounter for therapeutic drug level monitoring: Secondary | ICD-10-CM

## 2022-10-22 DIAGNOSIS — I480 Paroxysmal atrial fibrillation: Secondary | ICD-10-CM | POA: Diagnosis not present

## 2022-10-22 DIAGNOSIS — I4891 Unspecified atrial fibrillation: Secondary | ICD-10-CM | POA: Insufficient documentation

## 2022-10-22 DIAGNOSIS — I2510 Atherosclerotic heart disease of native coronary artery without angina pectoris: Secondary | ICD-10-CM | POA: Insufficient documentation

## 2022-10-22 LAB — POCT INR: INR: 1.8 — AB (ref 2.0–3.0)

## 2022-10-22 NOTE — Progress Notes (Signed)
Cardiology Office Note  Date: 10/22/2022   ID: [name], DOB 1941/09/24, MRN [id number]  PCP:  Redmond School, MD  Cardiologist:  Rozann Lesches, MD Electrophysiologist:  None   Chief Complaint  Patient presents with   Cardiac follow-up    History of Present Illness: [name] is an 81 y.o. male last seen in May.  He is here for a follow-up visit.  Reports no angina or palpitations since last encounter.  Did have COVID-19 before Thanksgiving, also recent treatment for URI with antibiotics and steroids.  He states that his blood sugars have been out of control.  He continues on Coumadin with follow-up in anticoagulation clinic.  Plan to get recheck INR, last level was therapeutic in early November.  I personally reviewed his ECG today which shows sinus rhythm with prolonged PR interval, R' in lead V1 and V2, PVC.  Past Medical History:  Diagnosis Date   Aortic atherosclerosis ([place])    Barrett's esophagus    Short-segment, diagnosed in 2007 by Dr. [name]   BPH (benign prostatic hyperplasia)    CAD (coronary artery disease)    a. cath on 07/21/2019 showing patent LM and LCx with 40-60% Proximal LAD and 30-40% Pro-RCA stenosis with normal LV function.   Essential hypertension    GERD (gastroesophageal reflux disease)    Hiatal hernia    History of kidney stones    PAF (paroxysmal atrial fibrillation) ([place])    S/P colonoscopy 2007   S/P endoscopy 08/2010    Salmon-colored epithelium coming up to 37 cm from the    Type 2 diabetes mellitus ([place])    Vertigo     Past Surgical History:  Procedure Laterality Date   APPENDECTOMY     BIOPSY  01/28/2020   Procedure: BIOPSY;  Surgeon: [name], MD;  [facility];  Service: Endoscopy;;  esophagus   CHOLECYSTECTOMY     COLONOSCOPY  02/06/2006   Dr. [name]- single anal papilla, o/w normal rectum, normal colon   COLONOSCOPY N/A 09/13/2016   RMR: three 5-6 mm for adenomas removed.  Recommend one last  surveillance colonoscopy in October 2020.   COLONOSCOPY WITH PROPOFOL N/A 01/28/2020   Procedure: COLONOSCOPY WITH PROPOFOL;  Surgeon: [name], MD;  [facility];  Service: Endoscopy;  Laterality: N/A;  10:45am   CYSTOSCOPY WITH RETROGRADE PYELOGRAM, URETEROSCOPY AND STENT PLACEMENT Right 08/18/2020   Procedure: CYSTOSCOPY WITH INCISION OF RIGHT URETEROCELE, URETEROSCOPY WITH HOLMIUM LASER, CYSTOLITHOLAPAXY OF 10 MM BLADDER STONE;  Surgeon: [name], MD;  [facility];  Service: Urology;  Laterality: Right;   ESOPHAGOGASTRODUODENOSCOPY  09/15/2010   Dr. [name]- Salmon-colored epithelium coming up to 37 cm from the    ESOPHAGOGASTRODUODENOSCOPY N/A 07/02/2013   Dr. [name]- Barretts on bx. hiatal hernia   ESOPHAGOGASTRODUODENOSCOPY N/A 09/13/2016   RMR: Barrett's esophagus without dysplasia. one last EGD in 3 years.    ESOPHAGOGASTRODUODENOSCOPY (EGD) WITH PROPOFOL N/A 01/28/2020   Procedure: ESOPHAGOGASTRODUODENOSCOPY (EGD) WITH PROPOFOL;  Surgeon: [name], MD;  [facility];  Service: Endoscopy;  Laterality: N/A;   INTRAVASCULAR PRESSURE WIRE/FFR STUDY N/A 07/21/2019   Procedure: INTRAVASCULAR PRESSURE WIRE/FFR STUDY;  Surgeon: [name], MD;  Location: [facility];  Service: Cardiovascular;  Laterality: N/A;   LEFT HEART CATH AND CORONARY ANGIOGRAPHY N/A 07/21/2019   Procedure: LEFT HEART CATH AND CORONARY ANGIOGRAPHY;  Surgeon: [name], MD;  Location: [facility];  Service: Cardiovascular;  Laterality: N/A;   POLYPECTOMY  01/28/2020   Procedure: POLYPECTOMY;  Surgeon: [name], MD;  [facility];  Service: Endoscopy;;  colon   ROTATOR CUFF REPAIR     YAG LASER APPLICATION Right 03/25/8468   Procedure: YAG LASER APPLICATION;  Surgeon: [name], MD;  [facility];  Service: Ophthalmology;  Laterality: Right;    Current Outpatient Medications  Medication Sig Dispense Refill   Accu-Chek FastClix Lancets  MISC 1 application by Does not apply route 2 (two) times daily. 102 each 3   allopurinol (ZYLOPRIM) 100 MG tablet Take 100 mg by mouth daily.      ALPRAZolam (XANAX) 1 MG tablet Take 0.5 mg by mouth at bedtime as needed for anxiety or sleep.     amoxicillin (AMOXIL) 875 MG tablet Take 875 mg by mouth 2 (two) times daily.     benzonatate (TESSALON) 200 MG capsule Take 200 mg by mouth 3 (three) times daily.     Cholecalciferol (VITAMIN D3) 50 MCG (2000 UT) TABS Take 2,000 Units by mouth daily.     DULoxetine (CYMBALTA) 60 MG capsule Take 60 mg by mouth daily.     escitalopram (LEXAPRO) 10 MG tablet Take 10 mg by mouth daily.     fexofenadine (ALLEGRA) 180 MG tablet Take 180 mg by mouth daily.     finasteride (PROSCAR) 5 MG tablet Take 1 tablet (5 mg total) by mouth daily. 90 tablet 3   gabapentin (NEURONTIN) 300 MG capsule Take 300 mg by mouth 2 (two) times daily.      glucose blood test strip 1 each by Other route daily before breakfast. Use as instructed 100 each 2   metFORMIN (GLUCOPHAGE-XR) 500 MG 24 hr tablet TAKE TWO TABLETS ('1000MG'$  TOTAL) BY MOUTHDAILY WITH BREAKFAST 180 tablet 0   methylPREDNISolone (MEDROL DOSEPAK) 4 MG TBPK tablet Take by mouth. Has 2 days left     pantoprazole (PROTONIX) 40 MG tablet TAKE ONE TABLET ('40MG'$  TOTAL) BY MOUTH TWO TIMES DAILY BEFORE A MEAL 180 tablet 1   Papav-Phentolamine-Alprostadil 150-10-100 MG-MG-MCG SOLR 1 mL by Intracavernosal route daily as needed (erectile dysfunction). PAP '30mg'$ / REG '2mg'$ / PGE1 63mg /ml.  Inject 120mprn no more than 2-3x weekly 5 each 11   rosuvastatin (CRESTOR) 10 MG tablet TAKE ONE TABLET ('10MG'$  TOTAL) BY MOUTH DAILY 90 tablet 1   sildenafil (VIAGRA) 50 MG tablet Take 50 mg by mouth daily.     silodosin (RAPAFLO) 8 MG CAPS capsule Take 1 capsule (8 mg total) by mouth daily with breakfast. 30 capsule 11   sitaGLIPtin (JANUVIA) 50 MG tablet Take 1 tablet (50 mg total) by mouth daily. 90 tablet 1   tadalafil (CIALIS) 20 MG tablet Take 1  tablet (20 mg total) by mouth daily as needed for erectile dysfunction. 30 tablet 11   traMADol (ULTRAM) 50 MG tablet Take 1 tablet (50 mg total) by mouth every 6 (six) hours as needed for moderate pain (pain.). 12 tablet 0   warfarin (COUMADIN) 5 MG tablet TAKE ONE TABLET BY MOUTH DAILY EXCEPT TAKE ONE AND ONE-HALF TABLETS ON SATURDAY OR AS DIRECTED 100 tablet 3   No current facility-administered medications for this visit.   Allergies:  Patient has no known allergies.   ROS: No dizziness or syncope.  Physical Exam: VS:  BP 120/70   Pulse 80   Ht '5\' 9"'$  (1.753 m)   Wt 202 lb 6.4 oz (91.8 kg)   SpO2 93%   BMI  29.89 kg/m , BMI Body mass index is 29.89 kg/m.  Wt Readings from Last 3 Encounters:  10/22/22 202 lb 6.4 oz (91.8 kg)  08/30/22 205 lb 9.6 oz (93.3 kg)  06/29/22 213 lb 3.2 oz (96.7 kg)    General: Patient appears comfortable at rest. HEENT: Conjunctiva and lids normal. Neck: Supple, no elevated JVP or carotid bruits. Lungs: Clear to auscultation, nonlabored breathing at rest. Cardiac: Regular rate and rhythm, no S3, 1/6 systolic murmur. Extremities: No pitting edema.  ECG:  An ECG dated 09/11/2021 was personally reviewed today and demonstrated:  Sinus bradycardia with low voltage, R' in lead V1 and V2.  Recent Labwork: 04/01/2022: Hemoglobin 12.9; Platelets 160 05/25/2022: ALT 15; AST 17; BUN 15; Creatinine, Ser 1.05; Potassium 4.0; Sodium 139; TSH 2.420   Other Studies Reviewed Today:  Cardiac catheterization 07/21/2019: Widely patent left main 40 to 60% proximal LAD with mild calcification.  DFR on LAD stenosis 0.94. Widely patent circumflex Very tortuous proximal right coronary with 30 to 40% proximal narrowing.  No significant obstruction identified. Normal LV function.   Echocardiogram 04/19/2020:  1. Left ventricular ejection fraction, by estimation, is 65 to 70%. The  left ventricle has normal function. The left ventricle has no regional  wall motion  abnormalities. Left ventricular diastolic parameters were  normal.   2. Right ventricular systolic function is normal. The right ventricular  size is normal. There is normal pulmonary artery systolic pressure. The  estimated right ventricular systolic pressure is 94.5 mmHg.   3. The mitral valve is grossly normal. Trivial mitral valve  regurgitation.   4. The aortic valve is tricuspid. Aortic valve regurgitation is trivial.   5. Aortic dilatation noted. There is mild dilatation of the aortic root.   6. The inferior vena cava is normal in size with greater than 50%  respiratory variability, suggesting right atrial pressure of 3 mmHg.   Assessment and Plan:  1.  Paroxysmal atrial fibrillation with CHA2DS2-VASc score of 5.  Continue Coumadin with follow-up INR in the anticoagulation clinic.  He reports no palpitations and is in sinus rhythm today by ECG.  2.  Nonobstructive CAD by cardiac catheterization in 2020.  Reports no obvious angina and continues on Crestor at this time.  Medication Adjustments/Labs and Tests Ordered: Current medicines are reviewed at length with the patient today.  Concerns regarding medicines are outlined above.   Tests Ordered: Orders Placed This Encounter  Procedures   EKG 12-Lead    Medication Changes: No orders of the defined types were placed in this encounter.   Disposition:  Follow up  6 months.  Signed, Satira Sark, MD, [facility] 10/22/2022 2:41 PM    [facility] [place], [place], [place] [address] Phone: [phone]; Fax: [phone]

## 2022-10-22 NOTE — Patient Instructions (Addendum)

## 2022-10-22 NOTE — Patient Instructions (Signed)
Take warfarin 1 1/2 tablets tonight then resume 1 tablet daily Recheck INR in 4 wk Call us with any new medications, changes or any bleeding concerns @ [phone].

## 2022-10-23 ENCOUNTER — Other Ambulatory Visit: Payer: Self-pay | Admitting: Nurse Practitioner

## 2022-10-23 MED ORDER — GLIPIZIDE ER 5 MG PO TB24: 5.0000 mg | ORAL_TABLET | Freq: Every day | ORAL | 1 refills | Status: DC

## 2022-10-23 NOTE — Progress Notes (Signed)
Patient came by with his meter showing significantly elevated readings.  He did have COVID near Thanksgiving and was given Prednisone for his symptoms.   Since then his glucose has been in the 300-500 range.  I advised him to restart Glipizide 5 mg XL daily with breakfast for now.  I also let him know when to go to the ED for high glucose for assistance.

## 2022-10-24 ENCOUNTER — Emergency Department (HOSPITAL_COMMUNITY)
Admission: EM | Admit: 2022-10-24 | Discharge: 2022-10-24 | Disposition: A | Discharge status: Home / Self Care | Payer: Medicare Other | Attending: Emergency Medicine | Admitting: Emergency Medicine

## 2022-10-24 ENCOUNTER — Telehealth: Payer: Self-pay | Admitting: *Deleted

## 2022-10-24 ENCOUNTER — Other Ambulatory Visit: Payer: Self-pay

## 2022-10-24 ENCOUNTER — Encounter (HOSPITAL_COMMUNITY): Payer: Self-pay

## 2022-10-24 DIAGNOSIS — Z7901 Long term (current) use of anticoagulants: Secondary | ICD-10-CM | POA: Insufficient documentation

## 2022-10-24 DIAGNOSIS — Z7984 Long term (current) use of oral hypoglycemic drugs: Secondary | ICD-10-CM | POA: Insufficient documentation

## 2022-10-24 DIAGNOSIS — I2510 Atherosclerotic heart disease of native coronary artery without angina pectoris: Secondary | ICD-10-CM | POA: Insufficient documentation

## 2022-10-24 DIAGNOSIS — R739 Hyperglycemia, unspecified: Secondary | ICD-10-CM

## 2022-10-24 DIAGNOSIS — E1165 Type 2 diabetes mellitus with hyperglycemia: Secondary | ICD-10-CM | POA: Insufficient documentation

## 2022-10-24 DIAGNOSIS — Z8616 Personal history of COVID-19: Secondary | ICD-10-CM | POA: Diagnosis not present

## 2022-10-24 LAB — CBC WITH DIFFERENTIAL/PLATELET
Abs Immature Granulocytes: 0.07 10*3/uL (ref 0.00–0.07)
Basophils Absolute: 0 10*3/uL (ref 0.0–0.1)
Basophils Relative: 0 %
Eosinophils Absolute: 0 10*3/uL (ref 0.0–0.5)
Eosinophils Relative: 0 %
HCT: 39.8 % (ref 39.0–52.0)
Hemoglobin: 14.1 g/dL (ref 13.0–17.0)
Immature Granulocytes: 1 %
Lymphocytes Relative: 10 %
Lymphs Abs: 1 10*3/uL (ref 0.7–4.0)
MCH: 31.7 pg (ref 26.0–34.0)
MCHC: 35.4 g/dL (ref 30.0–36.0)
MCV: 89.4 fL (ref 80.0–100.0)
Monocytes Absolute: 0.7 10*3/uL (ref 0.1–1.0)
Monocytes Relative: 7 %
Neutro Abs: 8.2 10*3/uL — ABNORMAL HIGH (ref 1.7–7.7)
Neutrophils Relative %: 82 %
Platelets: 193 10*3/uL (ref 150–400)
RBC: 4.45 MIL/uL (ref 4.22–5.81)
RDW: 13.4 % (ref 11.5–15.5)
WBC: 10 10*3/uL (ref 4.0–10.5)
nRBC: 0 % (ref 0.0–0.2)

## 2022-10-24 LAB — COMPREHENSIVE METABOLIC PANEL
ALT: 19 U/L (ref 0–44)
AST: 15 U/L (ref 15–41)
Albumin: 3.8 g/dL (ref 3.5–5.0)
Alkaline Phosphatase: 76 U/L (ref 38–126)
Anion gap: 10 (ref 5–15)
BUN: 28 mg/dL — ABNORMAL HIGH (ref 8–23)
CO2: 27 mmol/L (ref 22–32)
Calcium: 9.1 mg/dL (ref 8.9–10.3)
Chloride: 96 mmol/L — ABNORMAL LOW (ref 98–111)
Creatinine, Ser: 1.63 mg/dL — ABNORMAL HIGH (ref 0.61–1.24)
GFR, Estimated: 42 mL/min — ABNORMAL LOW (ref >60)
Glucose, Bld: 594 mg/dL (ref 70–99)
Potassium: 4.2 mmol/L (ref 3.5–5.1)
Sodium: 133 mmol/L — ABNORMAL LOW (ref 135–145)
Total Bilirubin: 0.5 mg/dL (ref 0.3–1.2)
Total Protein: 6.9 g/dL (ref 6.5–8.1)

## 2022-10-24 LAB — URINALYSIS, ROUTINE W REFLEX MICROSCOPIC
Bacteria, UA: NONE SEEN
Bilirubin Urine: NEGATIVE
Glucose, UA: >=500 mg/dL — AB
Ketones, ur: NEGATIVE mg/dL
Leukocytes,Ua: NEGATIVE
Nitrite: NEGATIVE
Protein, ur: NEGATIVE mg/dL
Specific Gravity, Urine: 1.026 (ref 1.005–1.030)
pH: 6 (ref 5.0–8.0)

## 2022-10-24 LAB — CBG MONITORING, ED
Glucose-Capillary: 236 mg/dL — ABNORMAL HIGH (ref 70–99)
Glucose-Capillary: >600 mg/dL (ref 70–99)

## 2022-10-24 MED ORDER — SODIUM CHLORIDE 0.9 % IV BOLUS
1000.0000 mL | Freq: Once | INTRAVENOUS | Status: AC
  Administered 2022-10-24: 1000 mL via INTRAVENOUS

## 2022-10-24 MED ORDER — CYCLOBENZAPRINE HCL 10 MG PO TABS
10.0000 mg | ORAL_TABLET | Freq: Once | ORAL | Status: AC
  Administered 2022-10-24: 10 mg via ORAL
  Filled 2022-10-24: qty 1

## 2022-10-24 MED ORDER — INSULIN ASPART 100 UNIT/ML IV SOLN
10.0000 [IU] | Freq: Once | INTRAVENOUS | Status: AC
  Administered 2022-10-24: 10 [IU] via INTRAVENOUS

## 2022-10-24 NOTE — ED Triage Notes (Signed)
Pt presents with hyperglycemia over 500 for the past few days. Pt is taking prednisone. Pt is on oral diabetic mediation.

## 2022-10-24 NOTE — Telephone Encounter (Signed)
[name] left a message stating that he had been to the ED last night because his blood sugar would not register. They got it down to 240 something. They gave him and IV with insulin. After being home and stirring around, his blood sugar is 247.  Patient complains of feeling light headed. He wasn't to know if he can take his medication.  Talked with Whitney and she states , yes he can , except for the Glipizide that was written yesterday to hold that. He is to hold th is today , and if his glucose is starting to trend back upwards by tomorrow , he can resume taking it. Patient is to be careful moving about, stand and wait  a few minutes. He is to drink lots of water.  Patient was called and made aware.

## 2022-10-24 NOTE — ED Provider Notes (Signed)
[facility] EMERGENCY DEPARTMENT Provider Note   CSN: [id number] Arrival date & time: 10/24/22  0008     History  Chief Complaint  Patient presents with   Hyperglycemia    [name] is a 81 y.o. male.  Patient is an 81 year old male with past medical history of type 2 diabetes, gout, hyperlipidemia, GERD, BPH, coronary artery disease.  Patient presenting today with complaints of elevated blood sugar.  He was diagnosed with COVID several weeks ago, but sinus congestion persisted so was prescribed an antibiotic and prednisone by his primary doctor.  For the past 2 days, he reports increased thirst, increased urination, and dry mouth.  His sugars have been running "high" since earlier today.  He denies any fevers or chills.  The history is provided by the patient.       Home Medications Prior to Admission medications   Medication Sig Start Date End Date Taking? Authorizing Provider  glipiZIDE (GLUCOTROL XL) 5 MG 24 hr tablet Take 1 tablet (5 mg total) by mouth daily with breakfast. 10/23/22   Brita Romp, NP  Accu-Chek FastClix Lancets MISC 1 application by Does not apply route 2 (two) times daily. 08/23/20   Brita Romp, NP  allopurinol (ZYLOPRIM) 100 MG tablet Take 100 mg by mouth daily.  02/12/19   [provider]  ALPRAZolam Duanne Moron) 1 MG tablet Take 0.5 mg by mouth at bedtime as needed for anxiety or sleep.    [provider]  amoxicillin (AMOXIL) 875 MG tablet Take 875 mg by mouth 2 (two) times daily. 10/19/22   [provider]  benzonatate (TESSALON) 200 MG capsule Take 200 mg by mouth 3 (three) times daily. 10/08/22   [provider]  Cholecalciferol (VITAMIN D3) 50 MCG (2000 UT) TABS Take 2,000 Units by mouth daily.    [provider]  DULoxetine (CYMBALTA) 60 MG capsule Take 60 mg by mouth daily. 10/20/20   [provider]  escitalopram (LEXAPRO) 10 MG tablet Take 10 mg by mouth daily. 01/02/22   [provider]  fexofenadine (ALLEGRA) 180 MG tablet Take 180 mg by mouth daily.    [provider]  finasteride (PROSCAR) 5 MG tablet Take 1 tablet (5 mg total) by mouth daily. 02/01/22   [name], MD  gabapentin (NEURONTIN) 300 MG capsule Take 300 mg by mouth 2 (two) times daily.     [provider]  glucose blood test strip 1 each by Other route daily before breakfast. Use as instructed 09/26/20   Brita Romp, NP  metFORMIN (GLUCOPHAGE-XR) 500 MG 24 hr tablet TAKE TWO TABLETS ('1000MG'$  TOTAL) BY MOUTHDAILY WITH BREAKFAST 06/25/22   Brita Romp, NP  methylPREDNISolone (MEDROL DOSEPAK) 4 MG TBPK tablet Take by mouth. Has 2 days left 10/19/22   [provider]  pantoprazole (PROTONIX) 40 MG tablet TAKE ONE TABLET ('40MG'$  TOTAL) BY MOUTH TWO TIMES DAILY BEFORE A MEAL 04/19/22   Mahala Menghini, PA-C  Papav-Phentolamine-Alprostadil 150-10-100 MG-MG-MCG SOLR 1 mL by Intracavernosal route daily as needed (erectile dysfunction). PAP '30mg'$ / REG '2mg'$ / PGE1 85mg /ml.  Inject 179mprn no more than 2-3x weekly 09/20/22   WrIrine SealMD  rosuvastatin (CRESTOR) 10 MG tablet TAKE ONE TABLET ('10MG'$  TOTAL) BY MOUTH DAILY 04/24/22   McSatira SarkMD  sildenafil (VIAGRA) 50 MG tablet Take 50 mg by mouth daily. 12/21/21   [provider]  silodosin (RAPAFLO) 8 MG CAPS capsule Take 1 capsule (8 mg total) by  mouth daily with breakfast. 09/20/22   [name], MD  sitaGLIPtin (JANUVIA) 50 MG tablet Take 1 tablet (50 mg total) by mouth daily. 08/30/22   Brita Romp, NP  tadalafil (CIALIS) 20 MG tablet Take 1 tablet (20 mg total) by mouth daily as needed for erectile dysfunction. 06/07/22   [name], MD  traMADol (ULTRAM) 50 MG tablet Take 1 tablet (50 mg total) by mouth every 6 (six) hours as needed for moderate pain (pain.). 08/18/20   [name], MD  warfarin (COUMADIN) 5 MG tablet TAKE ONE TABLET BY MOUTH DAILY EXCEPT TAKE ONE AND ONE-HALF TABLETS ON SATURDAY OR AS  DIRECTED 04/09/22   Marvis Moeller, NP      Allergies    Patient has no known allergies.    Review of Systems   Review of Systems  All other systems reviewed and are negative.   Physical Exam Updated Vital Signs BP 121/78   Pulse 84   Temp 98.3 F (36.8 C)   Resp 18   SpO2 96%  Physical Exam Vitals and nursing note reviewed.  Constitutional:      General: He is not in acute distress.    Appearance: He is well-developed. He is not diaphoretic.  HENT:     Head: Normocephalic and atraumatic.     Mouth/Throat:     Mouth: Mucous membranes are dry.     Pharynx: No oropharyngeal exudate or posterior oropharyngeal erythema.  Cardiovascular:     Rate and Rhythm: Normal rate and regular rhythm.     Heart sounds: No murmur heard.    No friction rub.  Pulmonary:     Effort: Pulmonary effort is normal. No respiratory distress.     Breath sounds: Normal breath sounds. No wheezing or rales.  Abdominal:     General: Bowel sounds are normal. There is no distension.     Palpations: Abdomen is soft.     Tenderness: There is no abdominal tenderness.  Musculoskeletal:        General: Normal range of motion.     Cervical back: Normal range of motion and neck supple.  Skin:    General: Skin is warm and dry.  Neurological:     Mental Status: He is alert and oriented to person, place, and time.     Coordination: Coordination normal.     ED Results / Procedures / Treatments   Labs (all labs ordered are listed, but only abnormal results are displayed) Labs Reviewed  CBG MONITORING, ED - Abnormal; Notable for the following components:      Result Value   Glucose-Capillary >600 (*)    All other components within normal limits    EKG None  Radiology No results found.  Procedures Procedures    Medications Ordered in ED Medications  sodium chloride 0.9 % bolus 1,000 mL (has no administration in time range)  insulin aspart (novoLOG) injection 10 Units (has no  administration in time range)    ED Course/ Medical Decision Making/ A&P  Patient is an 81 year old male with history of type 2 diabetes and additional medical history as described in the HPI presenting with elevated blood sugar.  He arrives here with stable vital signs, is afebrile, and appears clinically well.  Workup initiated including CBC, comprehensive metabolic panel.  Initial blood sugar is nearly 600, but labs otherwise unremarkable.  Patient was given 1 L of normal saline along with 10 units of IV NovoLog.  Upon repeat, his blood sugar is now  234.  He has recently been taking prednisone for an upper respiratory infection and also reports eating a piece of pie earlier this evening.  I suspect the steroid therapy and dietary indiscretions over the likely cause of his elevated blood sugar.  Patient advised to stop taking the prednisone and keep a close eye on his blood sugars at home.  Final Clinical Impression(s) / ED Diagnoses Final diagnoses:  None    Rx / DC Orders ED Discharge Orders     None         Veryl Speak, MD 10/24/22 [phone]

## 2022-10-24 NOTE — Discharge Instructions (Signed)
Stop taking the prednisone you were previously prescribed.  Keep a record of your blood sugars at home and follow-up with your primary doctor to discuss whether or not your medications require adjustment.

## 2022-10-26 ENCOUNTER — Other Ambulatory Visit: Payer: Self-pay | Admitting: Nurse Practitioner

## 2022-10-29 ENCOUNTER — Telehealth: Payer: Self-pay | Admitting: Nurse Practitioner

## 2022-10-29 NOTE — Telephone Encounter (Signed)
You can go ahead and add him on the schedule this week sometime

## 2022-10-29 NOTE — Telephone Encounter (Signed)
Please see ER Note for Hyperglycemia - pt is asking to be seen. Let me know what we need to do

## 2022-10-30 ENCOUNTER — Ambulatory Visit (INDEPENDENT_AMBULATORY_CARE_PROVIDER_SITE_OTHER): Payer: Medicare Other | Admitting: Nurse Practitioner

## 2022-10-30 ENCOUNTER — Encounter: Payer: Self-pay | Admitting: Nurse Practitioner

## 2022-10-30 VITALS — BP 110/64 | HR 83 | Ht 69.0 in | Wt 201.8 lb

## 2022-10-30 DIAGNOSIS — E559 Vitamin D deficiency, unspecified: Secondary | ICD-10-CM | POA: Diagnosis not present

## 2022-10-30 DIAGNOSIS — E1165 Type 2 diabetes mellitus with hyperglycemia: Secondary | ICD-10-CM

## 2022-10-30 DIAGNOSIS — R5383 Other fatigue: Secondary | ICD-10-CM

## 2022-10-30 NOTE — Progress Notes (Signed)
Endocrinology Follow Up Visit      10/30/2022, 3:17 PM      Subjective:    Patient ID: [name], male    DOB: 80-19-1942.  [name] is being seen in follow up after being seen in consultation for management of currently uncontrolled symptomatic diabetes requested by  Redmond School, MD.  He is a retired Engineer, structural for [employer].   Past Medical History:  Diagnosis Date   Aortic atherosclerosis ([place])    Barrett's esophagus    Short-segment, diagnosed in 2007 by Dr. [name]   BPH (benign prostatic hyperplasia)    CAD (coronary artery disease)    a. cath on 07/21/2019 showing patent LM and LCx with 40-60% Proximal LAD and 30-40% Pro-RCA stenosis with normal LV function.   Essential hypertension    GERD (gastroesophageal reflux disease)    Hiatal hernia    History of kidney stones    PAF (paroxysmal atrial fibrillation) ([place])    S/P colonoscopy 2007   S/P endoscopy 08/2010    Salmon-colored epithelium coming up to 37 cm from the    Type 2 diabetes mellitus ([place])    Vertigo     Past Surgical History:  Procedure Laterality Date   APPENDECTOMY     BIOPSY  01/28/2020   Procedure: BIOPSY;  Surgeon: [name], MD;  [facility];  Service: Endoscopy;;  esophagus   CHOLECYSTECTOMY     COLONOSCOPY  02/06/2006   Dr. [name]- single anal papilla, o/w normal rectum, normal colon   COLONOSCOPY N/A 09/13/2016   RMR: three 5-6 mm for adenomas removed.  Recommend one last surveillance colonoscopy in October 2020.   COLONOSCOPY WITH PROPOFOL N/A 01/28/2020   Procedure: COLONOSCOPY WITH PROPOFOL;  Surgeon: [name], MD;  [facility]: [facility];  Service: Endoscopy;  Laterality: N/A;  10:45am   CYSTOSCOPY WITH RETROGRADE PYELOGRAM, URETEROSCOPY AND STENT PLACEMENT Right 08/18/2020   Procedure: CYSTOSCOPY WITH INCISION OF RIGHT URETEROCELE, URETEROSCOPY WITH  HOLMIUM LASER, CYSTOLITHOLAPAXY OF 10 MM BLADDER STONE;  Surgeon: [name], MD;  Location: [facility];  Service: Urology;  Laterality: Right;   ESOPHAGOGASTRODUODENOSCOPY  09/15/2010   Dr. [name]- Salmon-colored epithelium coming up to 37 cm from the    ESOPHAGOGASTRODUODENOSCOPY N/A 07/02/2013   Dr. [name]- Barretts on bx. hiatal hernia   ESOPHAGOGASTRODUODENOSCOPY N/A 09/13/2016   RMR: Barrett's esophagus without dysplasia. one last EGD in 3 years.    ESOPHAGOGASTRODUODENOSCOPY (EGD) WITH PROPOFOL N/A 01/28/2020   Procedure: ESOPHAGOGASTRODUODENOSCOPY (EGD) WITH PROPOFOL;  Surgeon: [name], MD;  [facility];  Service: Endoscopy;  Laterality: N/A;   INTRAVASCULAR PRESSURE WIRE/FFR STUDY N/A 07/21/2019   Procedure: INTRAVASCULAR PRESSURE WIRE/FFR STUDY;  Surgeon: [name], MD;  Location: [facility];  Service: Cardiovascular;  Laterality: N/A;   LEFT HEART CATH AND CORONARY ANGIOGRAPHY N/A 07/21/2019   Procedure: LEFT HEART CATH AND CORONARY ANGIOGRAPHY;  Surgeon: [name], MD;  Location: [facility];  Service: Cardiovascular;  Laterality: N/A;   POLYPECTOMY  01/28/2020   Procedure: POLYPECTOMY;  Surgeon: [name], MD;  [facility];  Service: Endoscopy;;  colon   ROTATOR CUFF REPAIR     YAG LASER APPLICATION Right 11/28/5091   Procedure: YAG LASER APPLICATION;  Surgeon: [name], MD;  Location: [facility];  Service: Ophthalmology;  Laterality: Right;    Social History   Socioeconomic History   Marital status: Married    Spouse name: Not on file   Number of children: Not on file   Years of education: Not on file   Highest education level: Not on file  Occupational History   Not on file  Tobacco Use   Smoking status: Former    Packs/day: 1.50    Years: 30.00    Total pack years: 45.00    Types: Cigarettes    Quit date: 11/18/1993    Years since quitting: 28.9   Smokeless tobacco: Never   Tobacco comments:    quit in the  90's  Vaping Use   Vaping Use: Never used  Substance and Sexual Activity   Alcohol use: No   Drug use: No   Sexual activity: Not on file  Other Topics Concern   Not on file  Social History Narrative   Not on file   Social Determinants of Health   Financial Resource Strain: Not on file  Food Insecurity: Not on file  Transportation Needs: Not on file  Physical Activity: Not on file  Stress: Not on file  Social Connections: Not on file    Family History  Problem Relation Age of Onset   Prostate cancer Father        deceased   Stroke Mother        deceased   Colon cancer Neg Hx     Outpatient Encounter Medications as of 10/30/2022  Medication Sig   Accu-Chek FastClix Lancets MISC 1 application by Does not apply route 2 (two) times daily.   allopurinol (ZYLOPRIM) 100 MG tablet Take 100 mg by mouth daily.    ALPRAZolam (XANAX) 1 MG tablet Take 0.5 mg by mouth at bedtime as needed for anxiety or sleep.   amoxicillin (AMOXIL) 875 MG tablet Take 875 mg by mouth 2 (two) times daily.   benzonatate (TESSALON) 200 MG capsule Take 200 mg by mouth 3 (three) times daily.   Cholecalciferol (VITAMIN D3) 50 MCG (2000 UT) TABS Take 2,000 Units by mouth daily.   DULoxetine (CYMBALTA) 60 MG capsule Take 60 mg by mouth daily.   escitalopram (LEXAPRO) 10 MG tablet Take 10 mg by mouth daily.   fexofenadine (ALLEGRA) 180 MG tablet Take 180 mg by mouth daily.   finasteride (PROSCAR) 5 MG tablet Take 1 tablet (5 mg total) by mouth daily.   gabapentin (NEURONTIN) 300 MG capsule Take 300 mg by mouth 2 (two) times daily.    glipiZIDE (GLUCOTROL XL) 5 MG 24 hr tablet Take 1 tablet (5 mg total) by mouth daily with breakfast. (Patient not taking: Reported on 10/30/2022)   glucose blood test strip 1 each by Other route daily before breakfast. Use as instructed   metFORMIN (GLUCOPHAGE-XR) 500 MG 24 hr tablet TAKE TWO TABLETS ('1000MG'$  TOTAL) BY MOUTHDAILY WITH BREAKFAST   methylPREDNISolone (MEDROL DOSEPAK)  4 MG TBPK tablet Take by mouth. Has 2 days left   pantoprazole (PROTONIX) 40 MG tablet TAKE ONE TABLET ('40MG'$  TOTAL) BY MOUTH TWO TIMES DAILY BEFORE A MEAL   Papav-Phentolamine-Alprostadil 150-10-100 MG-MG-MCG SOLR 1 mL by Intracavernosal route daily as needed (erectile dysfunction). PAP '30mg'$ / REG '2mg'$ / PGE1 55mg /ml.  Inject  57m prn no more than 2-3x weekly   rosuvastatin (CRESTOR) 10 MG tablet TAKE ONE TABLET ('10MG'$  TOTAL) BY MOUTH DAILY   sildenafil (VIAGRA) 50 MG tablet Take 50 mg by mouth daily.   silodosin (RAPAFLO) 8 MG CAPS capsule Take 1 capsule (8 mg total) by mouth daily with breakfast.   sitaGLIPtin (JANUVIA) 50 MG tablet Take 1 tablet (50 mg total) by mouth daily.   tadalafil (CIALIS) 20 MG tablet Take 1 tablet (20 mg total) by mouth daily as needed for erectile dysfunction.   traMADol (ULTRAM) 50 MG tablet Take 1 tablet (50 mg total) by mouth every 6 (six) hours as needed for moderate pain (pain.).   warfarin (COUMADIN) 5 MG tablet TAKE ONE TABLET BY MOUTH DAILY EXCEPT TAKE ONE AND ONE-HALF TABLETS ON SATURDAY OR AS DIRECTED   No facility-administered encounter medications on file as of 10/30/2022.    ALLERGIES: No Known Allergies  VACCINATION STATUS: Immunization History  Administered Date(s) Administered   Influenza-Unspecified 09/19/2013    Diabetes He presents for his follow-up diabetic visit. He has type 2 diabetes mellitus. Onset time: diagnosed at approximate age of 89 His disease course has been stable. There are no hypoglycemic associated symptoms. Pertinent negatives for hypoglycemia include no confusion, headaches, seizures or tremors. Associated symptoms include fatigue and foot paresthesias. Pertinent negatives for diabetes include no chest pain, no foot ulcerations, no polydipsia, no polyuria, no visual change and no weight loss. There are no hypoglycemic complications. Symptoms are stable. Diabetic complications include heart disease and peripheral neuropathy. Risk  factors for coronary artery disease include diabetes mellitus, dyslipidemia, hypertension, male sex, obesity and sedentary lifestyle. Current diabetic treatment includes oral agent (triple therapy). He is compliant with treatment most of the time. His weight is decreasing steadily. He is following a generally unhealthy diet. When asked about meal planning, he reported none. He has not had a previous visit with a dietitian. He rarely participates in exercise. His home blood glucose trend is decreasing steadily. His overall blood glucose range is >200 mg/dl. (He presents today with his meter showing improved, yet still above target glycemic profile.  He was not due for another A1c today.  He had stopped by between visits for high readings related to oral prednisone use after complications from COVID infection.  He did seek treatment in the ED where his Prednisone was discontinued and he was given SQ insulin to bring his readings down.  He denies any hypoglycemia.  He does admit he snacks at night and struggles with choosing healthy meals.  He has really tried to stay away from sugary drinks, but has been drinking zero sugar Gatorade lately.) An ACE inhibitor/angiotensin II receptor blocker is not being taken. He does not see a podiatrist.Eye exam is current.    Review of systems  Constitutional: + steadily decreasing body weight,  current Body mass index is 29.8 kg/m. , ++ fatigue, no subjective hyperthermia, no subjective hypothermia Eyes: no blurry vision, no xerophthalmia ENT: no sore throat, no nodules palpated in throat, no dysphagia/odynophagia, no hoarseness Cardiovascular: no chest pain, no shortness of breath, no palpitations, no leg swelling Respiratory: no cough, no shortness of breath Gastrointestinal: no nausea/vomiting/diarrhea Musculoskeletal: no muscle/joint aches Skin: no rashes, no hyperemia Neurological: no tremors, + numbness/tingling to BLE, no dizziness Psychiatric: no depression,  no anxiety, + insomnia   Objective:    BP 110/64 (BP Location: Left Arm, Patient Position: Sitting, Cuff Size: Large)   Pulse 83   Ht '5\' 9"'$  (1.753  m)   Wt 201 lb 12.8 oz (91.5 kg)   BMI 29.80 kg/m   Wt Readings from Last 3 Encounters:  10/30/22 201 lb 12.8 oz (91.5 kg)  10/22/22 202 lb 6.4 oz (91.8 kg)  08/30/22 205 lb 9.6 oz (93.3 kg)    BP Readings from Last 3 Encounters:  10/30/22 110/64  10/24/22 116/73  10/22/22 120/70     Physical Exam- Limited  Constitutional:  Body mass index is 29.8 kg/m. , not in acute distress, normal state of mind Eyes:  EOMI, no exophthalmos Musculoskeletal: no gross deformities, strength intact in all four extremities, no gross restriction of joint movements Skin:  no rashes, no hyperemia Neurological: no tremor with outstretched hands  Diabetic Foot Exam - Simple   No data filed     CMP ( most recent) CMP     Component Value Date/Time   NA 133 (L) 10/24/2022 0041   NA 139 05/25/2022 1316   K 4.2 10/24/2022 0041   CL 96 (L) 10/24/2022 0041   CO2 27 10/24/2022 0041   GLUCOSE 594 (HH) 10/24/2022 0041   BUN 28 (H) 10/24/2022 0041   BUN 15 05/25/2022 1316   CREATININE 1.63 (H) 10/24/2022 0041   CREATININE 1.16 07/03/2019 1343   CALCIUM 9.1 10/24/2022 0041   PROT 6.9 10/24/2022 0041   PROT 5.9 (L) 05/25/2022 1316   ALBUMIN 3.8 10/24/2022 0041   ALBUMIN 3.8 05/25/2022 1316   AST 15 10/24/2022 0041   ALT 19 10/24/2022 0041   ALKPHOS 76 10/24/2022 0041   BILITOT 0.5 10/24/2022 0041   BILITOT 0.6 05/25/2022 1316   GFRNONAA 42 (L) 10/24/2022 0041   GFRAA >60 08/12/2020 1336     Diabetic Labs (most recent): Lab Results  Component Value Date   HGBA1C 8.3 (A) 08/30/2022   HGBA1C 7.9 (H) 04/01/2022   HGBA1C 8.3 (A) 02/21/2022   MICROALBUR 10 02/21/2022   MICROALBUR 30 02/23/2021     Lipid Panel ( most recent) Lipid Panel  No results found for: "CHOL", "TRIG", "HDL", "CHOLHDL", "VLDL", "LDLCALC", "LDLDIRECT", "LABVLDL"     Lab Results  Component Value Date   TSH 2.420 05/25/2022   TSH 3.235 05/02/2018   FREET4 1.10 05/25/2022         Assessment & Plan:   1) Type 2 diabetes mellitus with hyperglycemia, without long-term current use of insulin ([place])  He presents today with his meter showing improved, yet still above target glycemic profile.  He was not due for another A1c today.  He had stopped by between visits for high readings related to oral prednisone use after complications from COVID infection.  He did seek treatment in the ED where his Prednisone was discontinued and he was given SQ insulin to bring his readings down.  He denies any hypoglycemia.  He does admit he snacks at night and struggles with choosing healthy meals.  He has really tried to stay away from sugary drinks, but has been drinking zero sugar Gatorade lately.  Analysis of his meter shows 7-day average of 232, 14-day average of 261, 28-day average of 255.  - RANVEER WAHLSTROM has currently uncontrolled symptomatic type 2 DM since 81 years of age.   Recent labs reviewed.  - I had a long discussion with him about the progressive nature of diabetes and the pathology behind its complications.  -his diabetes is complicated by CAD, peripheral neuropathy and he remains at a high risk for more acute and chronic complications which include CAD, CVA,  CKD, retinopathy, and neuropathy. These are all discussed in detail with him.  - Nutritional counseling repeated at each appointment due to patients tendency to fall back in to old habits.  - The patient admits there is a room for improvement in their diet and drink choices. -  Suggestion is made for the patient to avoid simple carbohydrates from their diet including Cakes, Sweet Desserts / Pastries, Ice Cream, Soda (diet and regular), Sweet Tea, Candies, Chips, Cookies, Sweet Pastries, Store Bought Juices, Alcohol in Excess of 1-2 drinks a day, Artificial Sweeteners, Coffee Creamer, and "Sugar-free"  Products. This will help patient to have stable blood glucose profile and potentially avoid unintended weight gain.   - I encouraged the patient to switch to unprocessed or minimally processed complex starch and increased protein intake (animal or plant source), fruits, and vegetables.   - Patient is advised to stick to a routine mealtimes to eat 3 meals a day and avoid unnecessary snacks (to snack only to correct hypoglycemia).  - I have approached him with the following individualized plan to manage  his diabetes and patient agrees:   -He is advised to continue his Metformin 1000 mg ER daily with breakfast, Glipizide 5 mg po daily with breakfast, and Januvia 50 mg po daily to help with postprandial spikes in glucose.  He wishes to stay away from injectables at this time.  -He is encouraged to consistently monitor blood glucose at least twice daily, before breakfast and before bed, and to notify the clinic if his glucose readings are less than 70 or above 300 for 3 tests in a row.  - Specific targets for  A1c;  LDL, HDL,  and Triglycerides were discussed with the patient.  2) Blood Pressure /Hypertension:  His blood pressure is controlled to target without the use of antihypertensive medications.  3) Lipids/Hyperlipidemia:  There are no recent lipid panel results to review, nor does he take any medication for high cholesterol.  He reports he has had his cholesterol checked at his PCP, although no records have been sent to [place].   4)  Weight/Diet:  His Body mass index is 25.0 kg/m.  - complicating his diabetes care.   he is a candidate for some weight loss. I discussed with him the fact that loss of 5 - 10% of his  current body weight will have the most impact on his diabetes management.  Exercise, and detailed carbohydrates information provided  -  detailed on discharge instructions.  5) Chronic Care/Health Maintenance: -he is encouraged to initiate and continue to follow up with  Ophthalmology, Dentist,  Podiatrist at least yearly or according to recommendations, and advised to stay away from smoking and/or secondhand smoke exposure. I have recommended yearly flu vaccine and pneumonia vaccine at least every 5 years; moderate intensity exercise for up to 150 minutes weekly; and  sleep for at least 7 hours a day.    - he is advised to maintain close follow up with Redmond School, MD for primary care needs, as well as his other providers for optimal and coordinated care.     I spent 42 minutes in the care of the patient today including review of labs from [place], Lipids, Thyroid Function, Hematology (current and previous including abstractions from other facilities); face-to-face time discussing  his blood glucose readings/logs, discussing hypoglycemia and hyperglycemia episodes and symptoms, medications doses, his options of short and long term treatment based on the latest standards of care / guidelines;  discussion about incorporating  lifestyle medicine;  and documenting the encounter. Risk reduction counseling performed per USPSTF guidelines to reduce obesity and cardiovascular risk factors.     Please refer to Patient Instructions for Blood Glucose Monitoring and Insulin/Medications Dosing Guide"  in media tab for additional information. Please  also refer to " Patient Self Inventory" in the Media  tab for reviewed elements of pertinent patient history.  [name] participated in the discussions, expressed understanding, and voiced agreement with the above plans.  All questions were answered to his satisfaction. he is encouraged to contact clinic should he have any questions or concerns prior to his return visit.    Follow up plan: - Return keep regularly schedule appt.  Rayetta Pigg, [facility] [address] [place], [place] [address] Phone: [phone] Fax: [phone]  10/30/2022, 3:17 PM

## 2022-11-05 ENCOUNTER — Other Ambulatory Visit: Payer: Self-pay | Admitting: Urology

## 2022-11-05 DIAGNOSIS — R39198 Other difficulties with micturition: Secondary | ICD-10-CM

## 2022-11-08 ENCOUNTER — Other Ambulatory Visit: Payer: Medicare Other

## 2022-11-15 ENCOUNTER — Ambulatory Visit: Payer: Medicare Other | Admitting: Urology

## 2022-11-15 DIAGNOSIS — E663 Overweight: Secondary | ICD-10-CM | POA: Diagnosis not present

## 2022-11-15 DIAGNOSIS — J069 Acute upper respiratory infection, unspecified: Secondary | ICD-10-CM | POA: Diagnosis not present

## 2022-11-15 DIAGNOSIS — Z6827 Body mass index (BMI) 27.0-27.9, adult: Secondary | ICD-10-CM | POA: Diagnosis not present

## 2022-11-15 DIAGNOSIS — J329 Chronic sinusitis, unspecified: Secondary | ICD-10-CM | POA: Diagnosis not present

## 2022-11-20 ENCOUNTER — Ambulatory Visit: Payer: Medicare Other | Attending: Orthopedic Surgery | Admitting: *Deleted

## 2022-11-20 ENCOUNTER — Ambulatory Visit (INDEPENDENT_AMBULATORY_CARE_PROVIDER_SITE_OTHER): Payer: Medicare Other

## 2022-11-20 ENCOUNTER — Encounter: Payer: Self-pay | Admitting: Orthopedic Surgery

## 2022-11-20 ENCOUNTER — Ambulatory Visit (INDEPENDENT_AMBULATORY_CARE_PROVIDER_SITE_OTHER): Payer: Medicare Other | Admitting: Orthopedic Surgery

## 2022-11-20 VITALS — BP 116/51 | HR 62 | Ht 69.0 in | Wt 204.0 lb

## 2022-11-20 DIAGNOSIS — Z5181 Encounter for therapeutic drug level monitoring: Secondary | ICD-10-CM | POA: Diagnosis not present

## 2022-11-20 DIAGNOSIS — M25561 Pain in right knee: Secondary | ICD-10-CM

## 2022-11-20 DIAGNOSIS — I4891 Unspecified atrial fibrillation: Secondary | ICD-10-CM | POA: Diagnosis not present

## 2022-11-20 LAB — POCT INR: INR: 1.4 — AB (ref 2.0–3.0)

## 2022-11-20 NOTE — Patient Instructions (Signed)
Take warfarin 2 tablets tonight then increase dose to 1 tablet daily except 1 1/2 tablets on Tuesdays and Fridays Recheck INR in 2 wk Call us with any new medications, changes or any bleeding concerns @ [phone].

## 2022-11-20 NOTE — Patient Instructions (Signed)

## 2022-11-20 NOTE — Progress Notes (Addendum)
Orthopaedic Clinic Return  Assessment: [name] is a 82 y.o. male with the following: Right knee pain; atraumatic   Plan: [name] has pain in his right knee.  Atraumatic onset.  His description of pain is consistent with arthritis.  Mild arthritis is appreciated on x-ray.  He has no effusion on physical exam.  We discussed proceeding with an injection, and he is interested.  This was completed in clinic today without problems.  Will follow-up as needed.   Procedure note injection Right knee joint   Verbal consent was obtained to inject the right knee joint  Timeout was completed to confirm the site of injection.  The skin was prepped with alcohol and ethyl chloride was sprayed at the injection site.  A 21-gauge needle was used to inject 40 mg of Depo-Medrol and 1% lidocaine (3 cc) into the right knee using an anterolateral approach.  There were no complications. A sterile bandage was applied.    Follow-up: Return if symptoms worsen or fail to improve.   Subjective:  Chief Complaint  Patient presents with   Knee Pain    Rt knee aching pain for 2 mos., pain is coming more frequently.    History of Present Illness: [name] is a 82 y.o. male who returns to clinic for evaluation of right knee pain.  He states he had pain in the right knee for the past 2 months.  He describes the pain as constant, and similar to a tooth ache.  No prior injuries in his right knee.  He does not have any swelling.  Pain is localized to the lateral aspect of the right knee.  It is affecting his sleep.  Medications including topical treatments have not been effective.  Review of Systems: No fevers or chills No numbness or tingling No chest pain No shortness of breath No bowel or bladder dysfunction No GI distress No headaches   Objective: BP (!) 116/51   Pulse 62   Ht '5\' 9"'$  (1.753 m)   Wt 204 lb (92.5 kg)   BMI 30.13 kg/m   Physical Exam:  Right knee without effusion.   Tenderness palpation over the lateral joint line.  No redness is appreciated.  Minimal crepitus with range of motion.  He is able to achieve full extension, with flexion beyond 90 degrees.  No increased laxity varus or valgus stress.  Negative Lachman.  IMAGING: I personally ordered and reviewed the following images:  X-rays of the right knee were obtained in clinic today.  No acute injuries are noted.  Neutral overall alignment.  Mild loss of joint space primarily within the medial compartment.  No bony lesions.  Impression: Right knee x-rays with mild degenerative changes   Mordecai Rasmussen, MD 11/20/2022 11:56 AM

## 2022-11-26 ENCOUNTER — Telehealth: Payer: Self-pay

## 2022-11-26 NOTE — Telephone Encounter (Signed)
Made patient aware that he was switched  from tamsulosin to silodosin at his last visit on 09/20/22. Per Dr. Jeffie Pollock don't know that this refill is needed unless he decided to go back to tamsulosin in which case, he can have it refilled. Patient voiced understanding.

## 2022-11-30 ENCOUNTER — Other Ambulatory Visit (HOSPITAL_COMMUNITY): Payer: Self-pay | Admitting: Family Medicine

## 2022-11-30 DIAGNOSIS — E1165 Type 2 diabetes mellitus with hyperglycemia: Secondary | ICD-10-CM | POA: Diagnosis not present

## 2022-11-30 DIAGNOSIS — F419 Anxiety disorder, unspecified: Secondary | ICD-10-CM | POA: Diagnosis not present

## 2022-11-30 DIAGNOSIS — Z6827 Body mass index (BMI) 27.0-27.9, adult: Secondary | ICD-10-CM | POA: Diagnosis not present

## 2022-11-30 DIAGNOSIS — E663 Overweight: Secondary | ICD-10-CM | POA: Diagnosis not present

## 2022-11-30 DIAGNOSIS — J069 Acute upper respiratory infection, unspecified: Secondary | ICD-10-CM

## 2022-12-01 LAB — COMPREHENSIVE METABOLIC PANEL
ALT: 15 IU/L (ref 0–44)
AST: 16 IU/L (ref 0–40)
Albumin/Globulin Ratio: 2 (ref 1.2–2.2)
Albumin: 4.1 g/dL (ref 3.8–4.8)
Alkaline Phosphatase: 72 IU/L (ref 44–121)
BUN/Creatinine Ratio: 15 (ref 10–24)
BUN: 14 mg/dL (ref 8–27)
Bilirubin Total: 0.4 mg/dL (ref 0.0–1.2)
CO2: 26 mmol/L (ref 20–29)
Calcium: 9.1 mg/dL (ref 8.6–10.2)
Chloride: 103 mmol/L (ref 96–106)
Creatinine, Ser: 0.94 mg/dL (ref 0.76–1.27)
Globulin, Total: 2.1 g/dL (ref 1.5–4.5)
Glucose: 144 mg/dL — ABNORMAL HIGH (ref 70–99)
Potassium: 4.3 mmol/L (ref 3.5–5.2)
Sodium: 141 mmol/L (ref 134–144)
Total Protein: 6.2 g/dL (ref 6.0–8.5)
eGFR: 82 mL/min/{1.73_m2} (ref >59)

## 2022-12-04 ENCOUNTER — Ambulatory Visit (INDEPENDENT_AMBULATORY_CARE_PROVIDER_SITE_OTHER): Payer: Medicare Other | Admitting: Nurse Practitioner

## 2022-12-04 ENCOUNTER — Encounter: Payer: Self-pay | Admitting: Nurse Practitioner

## 2022-12-04 VITALS — BP 104/66 | HR 73 | Ht 69.0 in | Wt 205.4 lb

## 2022-12-04 DIAGNOSIS — E559 Vitamin D deficiency, unspecified: Secondary | ICD-10-CM | POA: Diagnosis not present

## 2022-12-04 DIAGNOSIS — E1165 Type 2 diabetes mellitus with hyperglycemia: Secondary | ICD-10-CM

## 2022-12-04 LAB — POCT GLYCOSYLATED HEMOGLOBIN (HGB A1C): Hemoglobin A1C: 8.4 % — AB (ref 4.0–5.6)

## 2022-12-04 NOTE — Progress Notes (Signed)
Endocrinology Follow Up Visit      12/04/2022, 3:51 PM      Subjective:    Patient ID: [name], male    DOB: 09/15/1941.  [name] is being seen in follow up after being seen in consultation for management of currently uncontrolled symptomatic diabetes requested by  Redmond School, MD.  He is a retired Engineer, structural for [employer].   Past Medical History:  Diagnosis Date   Aortic atherosclerosis ([place])    Barrett's esophagus    Short-segment, diagnosed in 2007 by Dr. [name]   BPH (benign prostatic hyperplasia)    CAD (coronary artery disease)    a. cath on 07/21/2019 showing patent LM and LCx with 40-60% Proximal LAD and 30-40% Pro-RCA stenosis with normal LV function.   Essential hypertension    GERD (gastroesophageal reflux disease)    Hiatal hernia    History of kidney stones    PAF (paroxysmal atrial fibrillation) ([place])    S/P colonoscopy 2007   S/P endoscopy 08/2010    Salmon-colored epithelium coming up to 37 cm from the    Type 2 diabetes mellitus ([place])    Vertigo     Past Surgical History:  Procedure Laterality Date   APPENDECTOMY     BIOPSY  01/28/2020   Procedure: BIOPSY;  Surgeon: [name], MD;  [facility];  Service: Endoscopy;;  esophagus   CHOLECYSTECTOMY     COLONOSCOPY  02/06/2006   Dr. [name]- single anal papilla, o/w normal rectum, normal colon   COLONOSCOPY N/A 09/13/2016   RMR: three 5-6 mm for adenomas removed.  Recommend one last surveillance colonoscopy in October 2020.   COLONOSCOPY WITH PROPOFOL N/A 01/28/2020   Procedure: COLONOSCOPY WITH PROPOFOL;  Surgeon: [name], MD;  [facility]: [facility];  Service: Endoscopy;  Laterality: N/A;  10:45am   CYSTOSCOPY WITH RETROGRADE PYELOGRAM, URETEROSCOPY AND STENT PLACEMENT Right 08/18/2020   Procedure: CYSTOSCOPY WITH INCISION OF RIGHT URETEROCELE, URETEROSCOPY WITH HOLMIUM  LASER, CYSTOLITHOLAPAXY OF 10 MM BLADDER STONE;  Surgeon: [name], MD;  Location: [facility];  Service: Urology;  Laterality: Right;   ESOPHAGOGASTRODUODENOSCOPY  09/15/2010   Dr. [name]- Salmon-colored epithelium coming up to 37 cm from the    ESOPHAGOGASTRODUODENOSCOPY N/A 07/02/2013   Dr. [name]- Barretts on bx. hiatal hernia   ESOPHAGOGASTRODUODENOSCOPY N/A 09/13/2016   RMR: Barrett's esophagus without dysplasia. one last EGD in 3 years.    ESOPHAGOGASTRODUODENOSCOPY (EGD) WITH PROPOFOL N/A 01/28/2020   Procedure: ESOPHAGOGASTRODUODENOSCOPY (EGD) WITH PROPOFOL;  Surgeon: [name], MD;  [facility];  Service: Endoscopy;  Laterality: N/A;   INTRAVASCULAR PRESSURE WIRE/FFR STUDY N/A 07/21/2019   Procedure: INTRAVASCULAR PRESSURE WIRE/FFR STUDY;  Surgeon: [name], MD;  Location: [facility];  Service: Cardiovascular;  Laterality: N/A;   LEFT HEART CATH AND CORONARY ANGIOGRAPHY N/A 07/21/2019   Procedure: LEFT HEART CATH AND CORONARY ANGIOGRAPHY;  Surgeon: [name], MD;  Location: [facility];  Service: Cardiovascular;  Laterality: N/A;   POLYPECTOMY  01/28/2020   Procedure: POLYPECTOMY;  Surgeon: [name], MD;  [facility];  Service: Endoscopy;;  colon   ROTATOR CUFF REPAIR     YAG LASER APPLICATION Right 01/25/2504   Procedure: YAG LASER APPLICATION;  Surgeon: [name], MD;  Location: [facility];  Service: Ophthalmology;  Laterality: Right;    Social History   Socioeconomic History   Marital status: Married    Spouse name: Not on file   Number of children: Not on file   Years of education: Not on file   Highest education level: Not on file  Occupational History   Not on file  Tobacco Use   Smoking status: Former    Packs/day: 1.50    Years: 30.00    Total pack years: 45.00    Types: Cigarettes    Quit date: 11/18/1993    Years since quitting: 29.0   Smokeless tobacco: Never   Tobacco comments:    quit in the 90's   Vaping Use   Vaping Use: Never used  Substance and Sexual Activity   Alcohol use: No   Drug use: No   Sexual activity: Not on file  Other Topics Concern   Not on file  Social History Narrative   Not on file   Social Determinants of Health   Financial Resource Strain: Not on file  Food Insecurity: Not on file  Transportation Needs: Not on file  Physical Activity: Not on file  Stress: Not on file  Social Connections: Not on file    Family History  Problem Relation Age of Onset   Prostate cancer Father        deceased   Stroke Mother        deceased   Colon cancer Neg Hx     Outpatient Encounter Medications as of 12/04/2022  Medication Sig   Accu-Chek FastClix Lancets MISC 1 application by Does not apply route 2 (two) times daily.   allopurinol (ZYLOPRIM) 100 MG tablet Take 100 mg by mouth daily.    ALPRAZolam (XANAX) 1 MG tablet Take 0.5 mg by mouth at bedtime as needed for anxiety or sleep.   Cholecalciferol (VITAMIN D3) 50 MCG (2000 UT) TABS Take 2,000 Units by mouth daily.   DULoxetine (CYMBALTA) 60 MG capsule Take 60 mg by mouth daily.   escitalopram (LEXAPRO) 10 MG tablet Take 10 mg by mouth daily.   fexofenadine (ALLEGRA) 180 MG tablet Take 180 mg by mouth daily.   finasteride (PROSCAR) 5 MG tablet Take 1 tablet (5 mg total) by mouth daily.   gabapentin (NEURONTIN) 300 MG capsule Take 300 mg by mouth 2 (two) times daily.    glipiZIDE (GLUCOTROL XL) 5 MG 24 hr tablet Take 1 tablet (5 mg total) by mouth daily with breakfast.   glucose blood test strip 1 each by Other route daily before breakfast. Use as instructed   metFORMIN (GLUCOPHAGE-XR) 500 MG 24 hr tablet TAKE TWO TABLETS ('1000MG'$  TOTAL) BY MOUTHDAILY WITH BREAKFAST   pantoprazole (PROTONIX) 40 MG tablet TAKE ONE TABLET ('40MG'$  TOTAL) BY MOUTH TWO TIMES DAILY BEFORE A MEAL   Papav-Phentolamine-Alprostadil 150-10-100 MG-MG-MCG SOLR 1 mL by Intracavernosal route daily as needed (erectile dysfunction). PAP '30mg'$ / REG  '2mg'$ / PGE1 48mg /ml.  Inject 124mprn no more than 2-3x weekly   rosuvastatin (CRESTOR) 10 MG tablet TAKE ONE TABLET ('10MG'$  TOTAL) BY MOUTH DAILY   sildenafil (VIAGRA) 50 MG tablet Take 50 mg by mouth daily.   silodosin (RAPAFLO) 8 MG CAPS capsule Take 1 capsule (8 mg total) by mouth daily with breakfast.  sitaGLIPtin (JANUVIA) 50 MG tablet Take 1 tablet (50 mg total) by mouth daily.   tadalafil (CIALIS) 20 MG tablet Take 1 tablet (20 mg total) by mouth daily as needed for erectile dysfunction.   traMADol (ULTRAM) 50 MG tablet Take 1 tablet (50 mg total) by mouth every 6 (six) hours as needed for moderate pain (pain.).   warfarin (COUMADIN) 5 MG tablet TAKE ONE TABLET BY MOUTH DAILY EXCEPT TAKE ONE AND ONE-HALF TABLETS ON SATURDAY OR AS DIRECTED   amoxicillin (AMOXIL) 875 MG tablet Take 875 mg by mouth 2 (two) times daily. (Patient not taking: Reported on 11/20/2022)   benzonatate (TESSALON) 200 MG capsule Take 200 mg by mouth 3 (three) times daily. (Patient not taking: Reported on 11/20/2022)   methylPREDNISolone (MEDROL DOSEPAK) 4 MG TBPK tablet Take by mouth. Has 2 days left (Patient not taking: Reported on 11/20/2022)   No facility-administered encounter medications on file as of 12/04/2022.    ALLERGIES: Allergies  Allergen Reactions   Prednisone     Patient state that this medication made his blood sugar go real high    VACCINATION STATUS: Immunization History  Administered Date(s) Administered   Influenza-Unspecified 09/19/2013    Diabetes He presents for his follow-up diabetic visit. He has type 2 diabetes mellitus. Onset time: diagnosed at approximate age of 29. His disease course has been stable. There are no hypoglycemic associated symptoms. Pertinent negatives for hypoglycemia include no confusion, headaches, seizures or tremors. Associated symptoms include fatigue and foot paresthesias. Pertinent negatives for diabetes include no chest pain, no foot ulcerations, no polydipsia, no  polyuria, no visual change and no weight loss. There are no hypoglycemic complications. Symptoms are stable. Diabetic complications include heart disease and peripheral neuropathy. Risk factors for coronary artery disease include diabetes mellitus, dyslipidemia, hypertension, male sex, obesity and sedentary lifestyle. Current diabetic treatment includes oral agent (triple therapy). He is compliant with treatment most of the time. His weight is decreasing steadily. He is following a generally unhealthy diet. When asked about meal planning, he reported none. He has not had a previous visit with a dietitian. He rarely participates in exercise. His home blood glucose trend is fluctuating minimally. His overall blood glucose range is 140-180 mg/dl. (He presents today with his meter showing above target glycemic profile.  His POCT A1c today is 8.4%, essentially unchanged from last visit.  He notes his sugar dropped low on several occasions and he was symptomatic.  Analysis of his meter shows 7-day average of 173, 14-day average of 199, 30-day average of 201.) An ACE inhibitor/angiotensin II receptor blocker is not being taken. He does not see a podiatrist.Eye exam is current.    Review of systems  Constitutional: + minimally fluctuating body weight,  current Body mass index is 30.33 kg/m. ,+ fatigue, no subjective hyperthermia, no subjective hypothermia Eyes: no blurry vision, no xerophthalmia ENT: no sore throat, no nodules palpated in throat, no dysphagia/odynophagia, no hoarseness Cardiovascular: no chest pain, no shortness of breath, no palpitations, no leg swelling Respiratory: no cough, no shortness of breath Gastrointestinal: no nausea/vomiting/diarrhea Musculoskeletal: no muscle/joint aches Skin: no rashes, no hyperemia Neurological: no tremors, + numbness/tingling to BLE, no dizziness Psychiatric: no depression, no anxiety, + insomnia   Objective:    BP 104/66 (BP Location: Left Arm, Patient  Position: Sitting, Cuff Size: Large)   Pulse 73   Ht '5\' 9"'$  (1.753 m)   Wt 205 lb 6.4 oz (93.2 kg)   BMI 30.33 kg/m   Wt Readings  from Last 3 Encounters:  12/04/22 205 lb 6.4 oz (93.2 kg)  11/20/22 204 lb (92.5 kg)  10/30/22 201 lb 12.8 oz (91.5 kg)    BP Readings from Last 3 Encounters:  12/04/22 104/66  11/20/22 (!) 116/51  10/30/22 110/64     Physical Exam- Limited  Constitutional:  Body mass index is 30.33 kg/m. , not in acute distress, normal state of mind Eyes:  EOMI, no exophthalmos Musculoskeletal: no gross deformities, strength intact in all four extremities, no gross restriction of joint movements Skin:  no rashes, no hyperemia Neurological: no tremor with outstretched hands   Diabetic Foot Exam - Simple   No data filed     CMP ( most recent) CMP     Component Value Date/Time   NA 141 11/30/2022 1504   K 4.3 11/30/2022 1504   CL 103 11/30/2022 1504   CO2 26 11/30/2022 1504   GLUCOSE 144 (H) 11/30/2022 1504   GLUCOSE 594 (HH) 10/24/2022 0041   BUN 14 11/30/2022 1504   CREATININE 0.94 11/30/2022 1504   CREATININE 1.16 07/03/2019 1343   CALCIUM 9.1 11/30/2022 1504   PROT 6.2 11/30/2022 1504   ALBUMIN 4.1 11/30/2022 1504   AST 16 11/30/2022 1504   ALT 15 11/30/2022 1504   ALKPHOS 72 11/30/2022 1504   BILITOT 0.4 11/30/2022 1504   GFRNONAA 42 (L) 10/24/2022 0041   GFRAA >60 08/12/2020 1336     Diabetic Labs (most recent): Lab Results  Component Value Date   HGBA1C 8.4 (A) 12/04/2022   HGBA1C 8.3 (A) 08/30/2022   HGBA1C 7.9 (H) 04/01/2022   MICROALBUR 10 02/21/2022   MICROALBUR 30 02/23/2021     Lipid Panel ( most recent) Lipid Panel  No results found for: "CHOL", "TRIG", "HDL", "CHOLHDL", "VLDL", "LDLCALC", "LDLDIRECT", "LABVLDL"    Lab Results  Component Value Date   TSH 2.420 05/25/2022   TSH 3.235 05/02/2018   FREET4 1.10 05/25/2022         Assessment & Plan:   1) Type 2 diabetes mellitus with hyperglycemia, without long-term  current use of insulin ([place])  He presents today with his meter showing above target glycemic profile.  His POCT A1c today is 8.4%, essentially unchanged from last visit.  He notes his sugar dropped low on several occasions and he was symptomatic.  Analysis of his meter shows 7-day average of 173, 14-day average of 199, 30-day average of 201.  - [name] has currently uncontrolled symptomatic type 2 DM since 82 years of age.   Recent labs reviewed.  - I had a long discussion with him about the progressive nature of diabetes and the pathology behind its complications.  -his diabetes is complicated by CAD, peripheral neuropathy and he remains at a high risk for more acute and chronic complications which include CAD, CVA, CKD, retinopathy, and neuropathy. These are all discussed in detail with him.  - Nutritional counseling repeated at each appointment due to patients tendency to fall back in to old habits.  - The patient admits there is a room for improvement in their diet and drink choices. -  Suggestion is made for the patient to avoid simple carbohydrates from their diet including Cakes, Sweet Desserts / Pastries, Ice Cream, Soda (diet and regular), Sweet Tea, Candies, Chips, Cookies, Sweet Pastries, Store Bought Juices, Alcohol in Excess of 1-2 drinks a day, Artificial Sweeteners, Coffee Creamer, and "Sugar-free" Products. This will help patient to have stable blood glucose profile and potentially avoid unintended weight  gain.   - I encouraged the patient to switch to unprocessed or minimally processed complex starch and increased protein intake (animal or plant source), fruits, and vegetables.   - Patient is advised to stick to a routine mealtimes to eat 3 meals a day and avoid unnecessary snacks (to snack only to correct hypoglycemia).  - I have approached him with the following individualized plan to manage  his diabetes and patient agrees:   -He is advised to continue his Metformin  1000 mg ER daily with breakfast, Glipizide 5 mg po daily with breakfast, and Januvia 50 mg po daily to help with postprandial spikes in glucose.  He wishes to stay away from injectables at this time.  -He is encouraged to monitor blood glucose at least twice daily, before breakfast and before bed, and to notify the clinic if his glucose readings are less than 70 or above 300 for 3 tests in a row.  Given his recent recurrent hypoglycemia, he would benefit from CGM device with alarms.  I did give him sample Dexcom G7 and receiver to get started today and sent script to [facility].  I did assist patient in setting it up and applying the sensor today.  - Specific targets for  A1c;  LDL, HDL,  and Triglycerides were discussed with the patient.  2) Blood Pressure /Hypertension:  His blood pressure is controlled to target without the use of antihypertensive medications.  3) Lipids/Hyperlipidemia:  There are no recent lipid panel results to review, nor does he take any medication for high cholesterol.  He reports he has had his cholesterol checked at his PCP, although no records have been sent to [place].   4)  Weight/Diet:  His Body mass index is 24.23 kg/m.  - complicating his diabetes care.   he is a candidate for some weight loss. I discussed with him the fact that loss of 5 - 10% of his  current body weight will have the most impact on his diabetes management.  Exercise, and detailed carbohydrates information provided  -  detailed on discharge instructions.  5) Chronic Care/Health Maintenance: -he is encouraged to initiate and continue to follow up with Ophthalmology, Dentist,  Podiatrist at least yearly or according to recommendations, and advised to stay away from smoking and/or secondhand smoke exposure. I have recommended yearly flu vaccine and pneumonia vaccine at least every 5 years; moderate intensity exercise for up to 150 minutes weekly; and  sleep for at least 7 hours a day.    - he is advised  to maintain close follow up with Redmond School, MD for primary care needs, as well as his other providers for optimal and coordinated care.     I spent 45 minutes in the care of the patient today including review of labs from [facility], Lipids, Thyroid Function, Hematology (current and previous including abstractions from other facilities); face-to-face time discussing  his blood glucose readings/logs, discussing hypoglycemia and hyperglycemia episodes and symptoms, medications doses, his options of short and long term treatment based on the latest standards of care / guidelines;  discussion about incorporating lifestyle medicine;  and documenting the encounter. Risk reduction counseling performed per USPSTF guidelines to reduce obesity and cardiovascular risk factors.     Please refer to Patient Instructions for Blood Glucose Monitoring and Insulin/Medications Dosing Guide"  in media tab for additional information. Please  also refer to " Patient Self Inventory" in the Media  tab for reviewed elements of pertinent patient history.  [name]  [place] participated in the discussions, expressed understanding, and voiced agreement with the above plans.  All questions were answered to his satisfaction. he is encouraged to contact clinic should he have any questions or concerns prior to his return visit.    Follow up plan: - Return in about 3 months (around 03/05/2023) for Diabetes F/U with A1c in office, No previsit labs, Bring meter and logs.  Rayetta Pigg, [facility] [address] [place], [place] [address] Phone: [phone] Fax: [phone]  12/04/2022, 3:51 PM

## 2022-12-05 ENCOUNTER — Ambulatory Visit: Payer: Medicare Other | Attending: Cardiology | Admitting: *Deleted

## 2022-12-05 DIAGNOSIS — Z5181 Encounter for therapeutic drug level monitoring: Secondary | ICD-10-CM

## 2022-12-05 DIAGNOSIS — I4891 Unspecified atrial fibrillation: Secondary | ICD-10-CM | POA: Diagnosis not present

## 2022-12-05 LAB — POCT INR: INR: 3 (ref 2.0–3.0)

## 2022-12-05 NOTE — Patient Instructions (Signed)
Continue warfarin 1 tablet daily except 1 1/2 tablets on Tuesdays and Fridays Recheck INR in 3 wk Call us with any new medications, changes or any bleeding concerns @ [phone].

## 2022-12-06 ENCOUNTER — Encounter: Payer: Self-pay | Admitting: Urology

## 2022-12-06 ENCOUNTER — Ambulatory Visit (HOSPITAL_COMMUNITY)
Admission: RE | Admit: 2022-12-06 | Discharge: 2022-12-06 | Disposition: A | Discharge status: Home / Self Care | Payer: Medicare Other | Source: Ambulatory Visit | Attending: Family Medicine | Admitting: Family Medicine

## 2022-12-06 ENCOUNTER — Ambulatory Visit (INDEPENDENT_AMBULATORY_CARE_PROVIDER_SITE_OTHER): Payer: Medicare Other | Admitting: Urology

## 2022-12-06 VITALS — BP 120/71 | HR 69

## 2022-12-06 DIAGNOSIS — J069 Acute upper respiratory infection, unspecified: Secondary | ICD-10-CM | POA: Insufficient documentation

## 2022-12-06 DIAGNOSIS — R351 Nocturia: Secondary | ICD-10-CM

## 2022-12-06 DIAGNOSIS — N401 Enlarged prostate with lower urinary tract symptoms: Secondary | ICD-10-CM

## 2022-12-06 DIAGNOSIS — R3912 Poor urinary stream: Secondary | ICD-10-CM | POA: Diagnosis not present

## 2022-12-06 DIAGNOSIS — N5201 Erectile dysfunction due to arterial insufficiency: Secondary | ICD-10-CM

## 2022-12-06 DIAGNOSIS — R972 Elevated prostate specific antigen [PSA]: Secondary | ICD-10-CM | POA: Diagnosis not present

## 2022-12-06 DIAGNOSIS — N138 Other obstructive and reflux uropathy: Secondary | ICD-10-CM

## 2022-12-06 DIAGNOSIS — I700 Atherosclerosis of aorta: Secondary | ICD-10-CM | POA: Diagnosis not present

## 2022-12-06 LAB — URINALYSIS, ROUTINE W REFLEX MICROSCOPIC
Bilirubin, UA: NEGATIVE
Ketones, UA: NEGATIVE
Leukocytes,UA: NEGATIVE
Nitrite, UA: NEGATIVE
Protein,UA: NEGATIVE
Specific Gravity, UA: 1.025 (ref 1.005–1.030)
Urobilinogen, Ur: 0.2 mg/dL (ref 0.2–1.0)
pH, UA: 5 (ref 5.0–7.5)

## 2022-12-06 LAB — MICROSCOPIC EXAMINATION
Bacteria, UA: NONE SEEN
Epithelial Cells (non renal): NONE SEEN /hpf (ref 0–10)
WBC, UA: NONE SEEN /hpf (ref 0–5)

## 2022-12-06 MED ORDER — CIPROFLOXACIN HCL 500 MG PO TABS
500.0000 mg | ORAL_TABLET | Freq: Once | ORAL | Status: AC
  Administered 2022-12-06: 500 mg via ORAL

## 2022-12-06 MED ORDER — PAPAV-PHENTOL-ALPROST-ATROPINE 150-20-0.2-2 MG IC SOLR: INTRACAVERNOUS | 11 refills | Status: DC

## 2022-12-06 NOTE — Progress Notes (Signed)
H&P 1. Erectile dysfunction due to arterial insufficiency   2. BPH with urinary obstruction   3. Weak urinary stream   4. Nocturia   5. Elevated PSA      Chief Complaint: LUTS  History of Present Illness:   12/06/22: [name] returns today in f/u.  He remains on finasteride and was changed to silodosin but he hasn't had much improvement.  His IPSS is 24 with a reduced stream.   Trimix has not worked for  him.   He didn't get the PSA I ordered prior to this visit.   11/1/23[name] returns today in f/u.  He has ED that has failed oral meds and was given a trimix test injection at his last visit with a partial response.  He reports that at home with a full syringe he still only gets a partial erection.   He is having some increased nocturia up to 5x with urgency.  He remains on tamsulosin or finasteride which was added last year.  He has some hesitancy.  HE has no hematuria or dysuria.  He has some mild discomfort in the left groin.  His prostate volume is about 55m on his CT in 2/22.  The UA today is clear.    08/16/22:[name] Greekreturns today for a penile test injection with trimix.   His PSA was up to 5.2 from 3.2 at his last visit.  He is on finasteride.    07/26/22:[name] Greekreturns today in f/u.  He was supposed to have a PSA prior to this visit but I don't see that was done.  He had the tadalafil increased to '20mg'$  at his last visit but has had an insufficient response.  He gets a partial erection but it is not sufficient for penetration and has difficulty maintaining it.  He has reduced sensation of orgasm and minimal fluid.     06/07/22:[name] Greekreturns today in f/u.  He has some progressive ED.  He didn't respond to tadalafil '5mg'$  or sildenafil.  He remains of finasteride and tamsulosin.  His IPSS is 17 with a reduced stream, intermittency and urgency.  He has nocturia x 2.  He has had no flank pain or hematuria.  He has a history of stones and a horseshoe kidney.  He remains on warfarin.    02/01/22:[name] Greekreturns today in f/u.  His PSA was down to 3.2 on 08/10/21 from 6.3 on 12/04/19.  He reports malodorous urine but no hematuria or dysuria.  He remains on finasteride and tamsulosin 0.'8mg'$ .  The nocturia is down to 1-2x.  He has a decent stream during the day.  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  He has ED and has tadalafil and sildenafil but hasn't had much response.  The tadalafil was expensive.   08/10/21: [name] returns today in f/u.  He is current on finasteride and tamsulosin 0.8 mg daily for his BPH with BOO and he reports improvement in his symptoms with the addition of the finasteride.  His IPSS is down to 23 from 30.  He has a good stream in the day that is slow at night.  He has nocturia x 3.  He has intermittency at night.  He has no incontinence.  He has had no hematuria or flank pain.  He can get a minimal partial erection and is inquiring about options.    02/02/21: DTylerreturns today in f/u.  He was given doxycyline on  11/04/20 for possible diverticulitis.   He had the CT I ordered on 12/26/20 and it showed diverticular disease without diverticulitis.  He has a horseshoe kidney with no residual stone disease or obstruction.  He has fairly severe LUTS with an IPSS of 30.  His urgency, frequency and intermittency.  He has a reduced stream at night and nocturia x 3.  He remains on tamsulosin.  He feels generally poorly today but has no fever.  He has no hematuria or dysuria.   12/17/21[name] returns today with a 1 week history of LLQ pain and hesitancy with frequent small voids.  He is not sleeping well because of the pain.  He has a history of stones and prostatitis.  He UA has 0-2 RBC's.  He had the Covid booster a week ago and had fever with that.  He has had no nausea.  He has a horseshoe kidney that had no left stones on a CT in 9/21.  He did have diverticular disease.   09/02/20: [name] returns today in f/u from recent right  ureteral stone extraction.  The stone was impacted and I had to incise the ureteral meatus to release the stone and then fragment it to get it out of the bladder.   He has no hematuria.   He has back pain but no flank pain .  He is voiding well.  His IPSS is 21.  07/01/20: [name] returns today in f/u.  He reports resolution of the penile lesion.   He continues to have nocturia but he is getting up a lot with his wife who has to get up.   He remains on tamsulosin.  His IPSS is 24.  He has been having some progressive SOB and Dr. Melvyn [name] had some questions about whether the tamsulosin may be contributing to the breathing issues.   His PSA in 1/21 was 6.3 which was down from 9.7.  He has some LLQ discomfort at times.  He has a horseshoe kidney and a right renal stone on a CT in 2015.   3.23.2021: Most recent PSA was 6.3 on 1.5.2021. He presents today c/o of sore spot underneath his penis that he has had for the last 2 wks. This has been too painful to touch. He has tried managing pain with medications and hot baths without success. He has also tried to "pop" it without producing any drainage. This pain has started to lessen in severity but he has recently begun having some mild dysuria and increased nocturia. Otherwise, his urinary sx's are stable.      01/27/19 10/23/18 10/17/17 09/18/16 09/06/15 03/02/15 04/03/14 06/13/12  PSA  Total PSA 6.4 ng/dl 9.7 ng/dl 5.3 ng/dl 5.8  4.66  5.64  5.20  4.36   Free PSA    1.0  0.56  0.58  0.64  0.62   % Free PSA  8 %  '17  12  10  12  14       '$ ROS: Otherwise negative.    Past Medical History:  Diagnosis Date   Aortic atherosclerosis ([place])    Barrett's esophagus    Short-segment, diagnosed in 2007 by Dr. [name]   BPH (benign prostatic hyperplasia)    CAD (coronary artery disease)    a. cath on 07/21/2019 showing patent LM and LCx with 40-60% Proximal LAD and 30-40% Pro-RCA stenosis with normal LV function.   Essential hypertension    GERD (gastroesophageal  reflux disease)    Hiatal hernia    History  of kidney stones    PAF (paroxysmal atrial fibrillation) ([place])    S/P colonoscopy 2007   S/P endoscopy 08/2010    Salmon-colored epithelium coming up to 37 cm from the    Type 2 diabetes mellitus ([place])    Vertigo     Past Surgical History:  Procedure Laterality Date   APPENDECTOMY     BIOPSY  01/28/2020   Procedure: BIOPSY;  Surgeon: [name], MD;  [facility];  Service: Endoscopy;;  esophagus   CHOLECYSTECTOMY     COLONOSCOPY  02/06/2006   Dr. [name]- single anal papilla, o/w normal rectum, normal colon   COLONOSCOPY N/A 09/13/2016   RMR: three 5-6 mm for adenomas removed.  Recommend one last surveillance colonoscopy in October 2020.   COLONOSCOPY WITH PROPOFOL N/A 01/28/2020   Procedure: COLONOSCOPY WITH PROPOFOL;  Surgeon: [name], MD;  [facility]: [facility];  Service: Endoscopy;  Laterality: N/A;  10:45am   CYSTOSCOPY WITH RETROGRADE PYELOGRAM, URETEROSCOPY AND STENT PLACEMENT Right 08/18/2020   Procedure: CYSTOSCOPY WITH INCISION OF RIGHT URETEROCELE, URETEROSCOPY WITH HOLMIUM LASER, CYSTOLITHOLAPAXY OF 10 MM BLADDER STONE;  Surgeon: [name], MD;  Location: [facility];  Service: Urology;  Laterality: Right;   ESOPHAGOGASTRODUODENOSCOPY  09/15/2010   Dr. [name]- Salmon-colored epithelium coming up to 37 cm from the    ESOPHAGOGASTRODUODENOSCOPY N/A 07/02/2013   Dr. [name]- Barretts on bx. hiatal hernia   ESOPHAGOGASTRODUODENOSCOPY N/A 09/13/2016   RMR: Barrett's esophagus without dysplasia. one last EGD in 3 years.    ESOPHAGOGASTRODUODENOSCOPY (EGD) WITH PROPOFOL N/A 01/28/2020   Procedure: ESOPHAGOGASTRODUODENOSCOPY (EGD) WITH PROPOFOL;  Surgeon: [name], MD;  [facility];  Service: Endoscopy;  Laterality: N/A;   INTRAVASCULAR PRESSURE WIRE/FFR STUDY N/A 07/21/2019   Procedure: INTRAVASCULAR PRESSURE WIRE/FFR STUDY;  Surgeon: [name], MD;  Location: [facility];  Service:  Cardiovascular;  Laterality: N/A;   LEFT HEART CATH AND CORONARY ANGIOGRAPHY N/A 07/21/2019   Procedure: LEFT HEART CATH AND CORONARY ANGIOGRAPHY;  Surgeon: [name], MD;  Location: [facility];  Service: Cardiovascular;  Laterality: N/A;   POLYPECTOMY  01/28/2020   Procedure: POLYPECTOMY;  Surgeon: [name], MD;  [facility];  Service: Endoscopy;;  colon   ROTATOR CUFF REPAIR     YAG LASER APPLICATION Right 07/21/3299   Procedure: YAG LASER APPLICATION;  Surgeon: [name], MD;  Location: [facility];  Service: Ophthalmology;  Laterality: Right;    Home Medications:  Allergies as of 12/06/2022       Reactions   Prednisone    Patient state that this medication made his blood sugar go real high        Medication List        Accurate as of December 06, 2022 11:59 PM. If you have any questions, ask your nurse or doctor.          STOP taking these medications    Papav-Phentolamine-Alprostadil 150-10-100 MG-MG-MCG Solr Stopped by: [name], MD   silodosin 8 MG Caps capsule Commonly known as: RAPAFLO Stopped by: [name], MD       TAKE these medications    Accu-Chek FastClix Lancets Misc 1 application by Does not apply route 2 (two) times daily.   allopurinol 100 MG tablet Commonly known as: ZYLOPRIM Take 100 mg by mouth daily.   ALPRAZolam 1 MG tablet Commonly known as: XANAX Take 0.5 mg by mouth at bedtime as needed for anxiety  or sleep.   amoxicillin 875 MG tablet Commonly known as: AMOXIL Take 875 mg by mouth 2 (two) times daily.   benzonatate 200 MG capsule Commonly known as: TESSALON Take 200 mg by mouth 3 (three) times daily.   DULoxetine 60 MG capsule Commonly known as: CYMBALTA Take 60 mg by mouth daily.   escitalopram 10 MG tablet Commonly known as: LEXAPRO Take 10 mg by mouth daily.   fexofenadine 180 MG tablet Commonly known as: ALLEGRA Take 180 mg by mouth daily.   finasteride 5 MG tablet Commonly known  as: PROSCAR Take 1 tablet (5 mg total) by mouth daily.   gabapentin 300 MG capsule Commonly known as: NEURONTIN Take 300 mg by mouth 2 (two) times daily.   glipiZIDE 5 MG 24 hr tablet Commonly known as: GLUCOTROL XL Take 1 tablet (5 mg total) by mouth daily with breakfast.   glucose blood test strip 1 each by Other route daily before breakfast. Use as instructed   metFORMIN 500 MG 24 hr tablet Commonly known as: GLUCOPHAGE-XR TAKE TWO TABLETS ('1000MG'$  TOTAL) BY MOUTHDAILY WITH BREAKFAST   methylPREDNISolone 4 MG Tbpk tablet Commonly known as: MEDROL DOSEPAK Take by mouth. Has 2 days left   pantoprazole 40 MG tablet Commonly known as: PROTONIX TAKE ONE TABLET ('40MG'$  TOTAL) BY MOUTH TWO TIMES DAILY BEFORE A MEAL   Papav-Phentol-Alprost-Atropine 150-20-0.2-2 MG Solr Inject 0.5 - 44m intracavernosaly 2-3 x 2weekly as needed Started by: JIrine Seal MD   rosuvastatin 10 MG tablet Commonly known as: CRESTOR TAKE ONE TABLET ('10MG'$  TOTAL) BY MOUTH DAILY   sildenafil 50 MG tablet Commonly known as: VIAGRA Take 50 mg by mouth daily.   sitaGLIPtin 50 MG tablet Commonly known as: Januvia Take 1 tablet (50 mg total) by mouth daily.   tadalafil 20 MG tablet Commonly known as: CIALIS Take 1 tablet (20 mg total) by mouth daily as needed for erectile dysfunction.   tamsulosin 0.4 MG Caps capsule Commonly known as: FLOMAX Take 0.4 mg by mouth daily.   traMADol 50 MG tablet Commonly known as: ULTRAM Take 1 tablet (50 mg total) by mouth every 6 (six) hours as needed for moderate pain (pain.).   Vitamin D3 50 MCG (2000 UT) Tabs Take 2,000 Units by mouth daily.   warfarin 5 MG tablet Commonly known as: COUMADIN Take as directed by the anticoagulation clinic. If you are unsure how to take this medication, talk to your nurse or doctor. Original instructions: TAKE ONE TABLET BY MOUTH DAILY EXCEPT TAKE ONE AND ONE-HALF TABLETS ON SATURDAY OR AS DIRECTED        Allergies:   Allergies  Allergen Reactions   Prednisone     Patient state that this medication made his blood sugar go real high    Family History  Problem Relation Age of Onset   Prostate cancer Father        deceased   Stroke Mother        deceased   Colon cancer Neg Hx     Social History:  reports that he quit smoking about 29 years ago. His smoking use included cigarettes. He has a 45.00 pack-year smoking history. He has never used smokeless tobacco. He reports that he does not drink alcohol and does not use drugs.  ROS: A complete review of systems was performed.  All systems are negative except for pertinent findings as noted.  Physical Exam:  BP 120/71   Pulse 69   Gen: WD, WN in NAD.  Laboratory Data:    Results for orders placed or performed in visit on 12/06/22 (from the past 24 hour(s))  Urinalysis, Routine w reflex microscopic     Status: Abnormal   Collection Time: 12/06/22 12:08 PM  Result Value Ref Range   Specific Gravity, UA 1.025 1.005 - 1.030   pH, UA 5.0 5.0 - 7.5   Color, UA Yellow Yellow   Appearance Ur Clear Clear   Leukocytes,UA Negative Negative   Protein,UA Negative Negative/Trace   Glucose, UA 1+ (A) Negative   Ketones, UA Negative Negative   RBC, UA Trace (A) Negative   Bilirubin, UA Negative Negative   Urobilinogen, Ur 0.2 0.2 - 1.0 mg/dL   Nitrite, UA Negative Negative   Microscopic Examination See below:    Narrative   Performed at:  [facility] [address] [place], [place]  [id number] Lab Director: Mina Marble MT, Phone:  [phone]  Microscopic Examination     Status: None   Collection Time: 12/06/22 12:08 PM   Urine  Result Value Ref Range   WBC, UA None seen 0 - 5 /hpf   RBC, Urine 0-2 0 - 2 /hpf   Epithelial Cells (non renal) None seen 0 - 10 /hpf   Bacteria, UA None seen None seen/Few   Narrative   Performed at:  [facility] [address] [place], [place]  [id number] Lab Director: [place], Phone:  [phone]   Procedure: Cystoscopy.  He was prepped with betadine and 2% lidocaine and given Cipro '500mg'$  po.  The flexible scope passed easily.  The urethra is normal. The prostate is about 3cm with bilobar hyperplasia with coaptation and there is a small middle lobe.  He has mild trabeculation with a scar on the posterior wall but no other mucosal lesions.  The UO's are normal.  The PVR was small.  There were no complications.   Impression/Assessment:  ED.  He has failed trimix so I will send the Quadmix.   BPH with BOO he has increased nocturia and hesitancy on current therapy.  He is currently on finasteride and tamsulosin.  He has trilobar hyperplasia with obstruction on cystoscopy today.   I discussed surgical options but he is content with his symptoms.  He will return in 4mofor a PVR.   Elevated PSA.  He didn't get the repeat prior to the visit today.  I will get it reordered for 2-3 weeks.   Hx of horseshoe kidney with stones.   CT on 12/26/20 was ok.   He has no symptoms and his UA is clear.        Orders Placed This Encounter  Procedures   Microscopic Examination   Urinalysis, Routine w reflex microscopic   PSA, total and free    Standing Status:   Future    Standing Expiration Date:   03/07/2023   PR COMPLEX UROFLOMETRY   Meds ordered this encounter  Medications   ciprofloxacin (CIPRO) tablet 500 mg   Papav-Phentol-Alprost-Atropine 150-20-0.2-2 MG SOLR    Sig: Inject 0.5 - 1107mintracavernosaly 2-3 x 2weekly as needed    Dispense:  5 each    Refill:  11   Return in about 3 months (around 03/07/2023) for with PVR.   CC: Dr. LaRedmond School Patient ID: DwDaphane Shepherdmale   DOB: 3/Feb 07, [id number].o.   MRN: [id number]

## 2022-12-06 NOTE — Progress Notes (Signed)
Uroflow  Peak Flow: 11m Average Flow: 397mVoided Volume: 20069moiding Time: 67sec Flow Time: 63sec Time to Peak Flow: 19sec

## 2022-12-07 ENCOUNTER — Other Ambulatory Visit: Payer: Self-pay | Admitting: Cardiology

## 2022-12-07 ENCOUNTER — Encounter: Payer: Self-pay | Admitting: Urology

## 2022-12-10 ENCOUNTER — Ambulatory Visit (INDEPENDENT_AMBULATORY_CARE_PROVIDER_SITE_OTHER): Payer: Medicare Other | Admitting: Orthopedic Surgery

## 2022-12-10 ENCOUNTER — Encounter: Payer: Self-pay | Admitting: Orthopedic Surgery

## 2022-12-10 DIAGNOSIS — G8929 Other chronic pain: Secondary | ICD-10-CM

## 2022-12-10 DIAGNOSIS — M25561 Pain in right knee: Secondary | ICD-10-CM | POA: Diagnosis not present

## 2022-12-10 NOTE — Patient Instructions (Signed)
Central Scheduling [id number]  While we are working on your approval please go ahead and call to schedule your appointment to be done within one week. If you can not get an appointment at [facility] within the next week, ask if they have something sooner at [facility] or [facility] if you are able to go to [place] to have the imaging done.  AFTER you have made your imaging appointment, please call our office back at [phone] to schedule an appointment to review your results.

## 2022-12-10 NOTE — Progress Notes (Signed)
Orthopaedic Clinic Return  Assessment: [name] is a 82 y.o. male with the following: Right knee pain; atraumatic   Plan: Mr. Heckmann continues to have pain in the lateral aspect of the right knee.  Previous injection provided some relief of his pain for a couple of days.  However, the pain has returned.  He has difficulty going up and down stairs.  He notices buckling in the right knee.  He has a constant ache.  At this point, he would like to obtain an MRI for further evaluation.  He is in agreement with this plan.  He will follow-up once the results are available.   Follow-up: Return for After MRI.   Subjective:  Chief Complaint  Patient presents with   Knee Pain    RT knee/ injection helped for a couple of days C/o pain with walking, go up and down stairs Painful on the lateral side of the knee/aches like a toothache    History of Present Illness: [name] is a 82 y.o. male who returns to clinic for evaluation of right knee pain.  I saw him in clinic approximately 3 weeks ago.  At that time, we injected the right knee.  He had some improvement in his symptoms, but this did not last longer than few days.  He is still complaining of pain in the lateral aspect of the right knee.  No specific injury.  Ibuprofen helps with some of his pain.  He notices a popping sensation.  He has radiating pains distally.  He describes the pain is consistent with a tooth ache.   Review of Systems: No fevers or chills No numbness or tingling No chest pain No shortness of breath No bowel or bladder dysfunction No GI distress No headaches   Objective: There were no vitals taken for this visit.  Physical Exam:  Right knee without effusion.  Tenderness palpation over the lateral joint line.  No redness is appreciated.  Minimal crepitus with range of motion.  He is able to achieve full extension, with flexion beyond 90 degrees.  No increased laxity varus or valgus stress.  Negative  Lachman.  IMAGING: I personally ordered and reviewed the following images:  No new imaging obtained today.   Mordecai Rasmussen, MD 12/10/2022 12:01 PM

## 2022-12-20 ENCOUNTER — Other Ambulatory Visit: Payer: Medicare Other

## 2022-12-20 DIAGNOSIS — R972 Elevated prostate specific antigen [PSA]: Secondary | ICD-10-CM | POA: Diagnosis not present

## 2022-12-21 ENCOUNTER — Telehealth: Payer: Self-pay

## 2022-12-21 LAB — PSA, TOTAL AND FREE
PSA, Free Pct: 10.6 %
PSA, Free: 0.33 ng/mL
Prostate Specific Ag, Serum: 3.1 ng/mL (ref 0.0–4.0)

## 2022-12-21 NOTE — Telephone Encounter (Signed)
-----   Message from [name], MD sent at 12/21/2022  9:07 AM EST ----- His PSA is down some to 3.1.  ----- Message ----- From: Sherrilyn Rist, CMA Sent: 12/21/2022   8:31 AM EST To: [name], MD  Please review

## 2022-12-21 NOTE — Telephone Encounter (Signed)
Patient is aware that his PSA is down to 3.1 and he has a follow up. Patient voiced understanding

## 2022-12-24 DIAGNOSIS — E1140 Type 2 diabetes mellitus with diabetic neuropathy, unspecified: Secondary | ICD-10-CM | POA: Diagnosis not present

## 2022-12-24 DIAGNOSIS — U099 Post covid-19 condition, unspecified: Secondary | ICD-10-CM | POA: Diagnosis not present

## 2022-12-24 DIAGNOSIS — I480 Paroxysmal atrial fibrillation: Secondary | ICD-10-CM | POA: Diagnosis not present

## 2022-12-24 DIAGNOSIS — Z6828 Body mass index (BMI) 28.0-28.9, adult: Secondary | ICD-10-CM | POA: Diagnosis not present

## 2022-12-24 DIAGNOSIS — I2510 Atherosclerotic heart disease of native coronary artery without angina pectoris: Secondary | ICD-10-CM | POA: Diagnosis not present

## 2022-12-24 DIAGNOSIS — J449 Chronic obstructive pulmonary disease, unspecified: Secondary | ICD-10-CM | POA: Diagnosis not present

## 2022-12-24 DIAGNOSIS — E6609 Other obesity due to excess calories: Secondary | ICD-10-CM | POA: Diagnosis not present

## 2022-12-24 DIAGNOSIS — R053 Chronic cough: Secondary | ICD-10-CM | POA: Diagnosis not present

## 2022-12-26 ENCOUNTER — Ambulatory Visit: Payer: Medicare Other | Attending: Orthopedic Surgery | Admitting: *Deleted

## 2022-12-26 DIAGNOSIS — Z5181 Encounter for therapeutic drug level monitoring: Secondary | ICD-10-CM | POA: Insufficient documentation

## 2022-12-26 DIAGNOSIS — I4891 Unspecified atrial fibrillation: Secondary | ICD-10-CM | POA: Diagnosis not present

## 2022-12-26 LAB — POCT INR: INR: 2.8 (ref 2.0–3.0)

## 2022-12-26 NOTE — Patient Instructions (Signed)
Continue warfarin 1 tablet daily except 1 1/2 tablets on Tuesdays and Fridays Recheck INR in 4 wk Call us with any new medications, changes or any bleeding concerns @ [phone].

## 2022-12-27 ENCOUNTER — Ambulatory Visit (HOSPITAL_COMMUNITY)
Admission: RE | Admit: 2022-12-27 | Discharge: 2022-12-27 | Disposition: A | Discharge status: Home / Self Care | Payer: Medicare Other | Source: Ambulatory Visit | Attending: Orthopedic Surgery | Admitting: Orthopedic Surgery

## 2022-12-27 DIAGNOSIS — M25561 Pain in right knee: Secondary | ICD-10-CM | POA: Diagnosis not present

## 2022-12-27 DIAGNOSIS — M25461 Effusion, right knee: Secondary | ICD-10-CM | POA: Diagnosis not present

## 2022-12-27 DIAGNOSIS — G8929 Other chronic pain: Secondary | ICD-10-CM | POA: Diagnosis not present

## 2023-01-08 ENCOUNTER — Ambulatory Visit: Payer: Medicare Other | Admitting: Orthopedic Surgery

## 2023-01-10 ENCOUNTER — Other Ambulatory Visit: Payer: Self-pay

## 2023-01-10 MED ORDER — GLIPIZIDE ER 5 MG PO TB24: 5.0000 mg | ORAL_TABLET | Freq: Every day | ORAL | 1 refills | Status: DC

## 2023-01-11 ENCOUNTER — Encounter: Payer: Self-pay | Admitting: Orthopedic Surgery

## 2023-01-11 ENCOUNTER — Ambulatory Visit (INDEPENDENT_AMBULATORY_CARE_PROVIDER_SITE_OTHER): Payer: Medicare Other | Admitting: Orthopedic Surgery

## 2023-01-11 VITALS — Ht 69.0 in | Wt 205.0 lb

## 2023-01-11 DIAGNOSIS — M25561 Pain in right knee: Secondary | ICD-10-CM

## 2023-01-11 DIAGNOSIS — G8929 Other chronic pain: Secondary | ICD-10-CM

## 2023-01-11 NOTE — Patient Instructions (Signed)
Brace on the right knee for support  Use cane to help with  walking and reduce pressure on the knee

## 2023-01-11 NOTE — Progress Notes (Signed)
Orthopaedic Clinic Return  Assessment: [name] is a 82 y.o. male with the following: Right knee pain; atraumatic Insufficiency fracture of the right lateral femoral condyle  Plan: Mr. Masch continues to have pain in the lateral aspect of the right knee.  MRI confirms the presence of fluid within the lateral femoral condyle, consistent with an insufficiency fracture.  This is most likely causing the lateral pain.  He does state it has improved, but I have recommended a period of bracing, as well as protected weightbearing.  He was fitted for a brace in clinic today.  I will see him back in 6 weeks.  He will contact the clinic if he has issues.  Follow-up: Return in about 6 weeks (around 02/22/2023).   Subjective:  Chief Complaint  Patient presents with   Knee Pain    Rt knee feeling better here for MRI results    History of Present Illness: [name] is a 82 y.o. male who returns to clinic for evaluation of right knee pain.  I have seen him multiple times for his right knee.  No specific injury.  Pain has been isolated to the lateral aspect of the knee.  Mild discomfort in the medial knee.  Minimal improvement with a steroid injection.  We obtained an MRI, and he is here to discuss the results.  Since I last saw him, he states the pain is gradually getting better.  He has not changed anything.  He continues to be low function.  He is not using an assistive device.   Review of Systems: No fevers or chills No numbness or tingling No chest pain No shortness of breath No bowel or bladder dysfunction No GI distress No headaches   Objective: Ht '5\' 9"'$  (1.753 m)   Wt 205 lb (93 kg)   BMI 30.27 kg/m   Physical Exam:  Right knee without effusion.  Tenderness palpation over the lateral joint line.  No redness is appreciated.  Minimal crepitus with range of motion.  He is able to achieve full extension, with flexion beyond 90 degrees.  No increased laxity varus or valgus  stress.  Negative Lachman.  IMAGING: I personally ordered and reviewed the following images:  Right knee MRI.  IMPRESSION: Small subchondral insufficiency fracture of the lateral femoral condyle with extensive associated marrow edema.   Nondisplaced horizontal tear of the posterior horn and body of the medial meniscus.   Tricompartment osteoarthritis, cartilaginous abnormalities as described above.   Trace joint effusions.  Small Baker's cyst.  Mordecai Rasmussen, MD 01/11/2023 3:46 PM

## 2023-01-21 ENCOUNTER — Encounter: Payer: Self-pay | Admitting: Internal Medicine

## 2023-01-21 ENCOUNTER — Ambulatory Visit (INDEPENDENT_AMBULATORY_CARE_PROVIDER_SITE_OTHER): Payer: Medicare Other | Admitting: Internal Medicine

## 2023-01-21 VITALS — BP 110/70 | HR 80 | Temp 98.5°F | Ht 69.0 in | Wt 208.0 lb

## 2023-01-21 DIAGNOSIS — R0609 Other forms of dyspnea: Secondary | ICD-10-CM | POA: Diagnosis not present

## 2023-01-21 NOTE — Patient Instructions (Signed)
To get the most out of exercise, you need to be continuously aware that you are short of breath, but never out of breath, for at least 20-30 minutes daily. As you improve, it will actually be easier for you to do the same amount of exercise  in  30 minutes so always push to the level where you are short of breath.     Make sure you check your oxygen saturations at highest level of activity   .Please schedule a follow up office visit in 4 weeks, sooner if needed in [employer]

## 2023-01-21 NOTE — Progress Notes (Unsigned)
[name], male    DOB: [birth year]    MRN: [id number]   Brief patient profile:  [id number] retired [employer] office/ [employer]  quit smoking in 1994 and some problem with nasal congestion that resolved  On daily allegra  then around 2018/19 doe indolent onset assoc with bilateral neuropathy and chronic back pain  referred to pulmonary clinic in [place]  06/28/2020 by Dr [name]    History of Present Illness  06/28/2020  Pulmonary/ 1st office eval/[name]  Chief Complaint  Patient presents with   Consult    shortness of breath with exertion  Dyspnea:  Uses HC / does ok shopping but has to stop at top of basement steps where didn't previously assoc  feels light headed never supine  Cough: some in am  Sleep: on flat bed/ either side, 2 pillows  SABA use: none Rec We will need to set you with PFT's > not done  Covid p Thxgiving 2023 and cough since > dark initially   then  gradually lighter so by end of March minimal and white and mostly in am.   01/21/2023  f/u ov/[name] re: post covid cough   Chief Complaint  Patient presents with   Acute Visit    Pt states had covid 07 Oct 2022 and has had a cough since. He states he gets winded walking up a flight of stairs.Cough occurs in the am- non prod.   Dyspnea:  still stops at top of basement steps/ dizzy too was walking  Cough: minimal  Sleeping:  flat bed/ on side 2 pillows  SABA use:occ saba  02: none  Covid status:   vax x 3      No obvious day to day or daytime variability or assoc excess/ purulent sputum or mucus plugs or hemoptysis or cp or chest tightness, subjective wheeze or overt sinus or hb symptoms.   Sleeping as above  without nocturnal  or early am exacerbation  of respiratory  c/o's or need for noct saba. Also denies any obvious fluctuation of symptoms with weather or environmental changes or other aggravating or alleviating factors except as outlined above   No unusual exposure hx or h/o childhood pna/ asthma or  knowledge of premature birth.  Current Allergies, Complete Past Medical History, Past Surgical History, Family History, and Social History were reviewed in [employer] record.  ROS  The following are not active complaints unless bolded Hoarseness, sore throat, dysphagia, dental problems, itching, sneezing,  nasal congestion or discharge of excess mucus or purulent secretions, ear ache,   fever, chills, sweats, unintended wt loss or wt gain, classically pleuritic or exertional cp,  orthopnea pnd or arm/hand swelling  or leg swelling, presyncope, palpitations, abdominal pain, anorexia, nausea, vomiting, diarrhea  or change in bowel habits or change in bladder habits, change in stools or change in urine, dysuria, hematuria,  rash, arthralgias, visual complaints, headache, numbness, weakness or ataxia or problems with walking or coordination,  change in mood or  memory.        Current Meds  Medication Sig   Accu-Chek FastClix Lancets MISC 1 application by Does not apply route 2 (two) times daily.   allopurinol (ZYLOPRIM) 100 MG tablet Take 100 mg by mouth daily.    ALPRAZolam (XANAX) 1 MG tablet Take 0.5 mg by mouth at bedtime as needed for anxiety or sleep.   benzonatate (TESSALON) 200 MG capsule Take 200 mg by mouth 3 (three) times daily.   Cholecalciferol (  VITAMIN D3) 50 MCG (2000 UT) TABS Take 2,000 Units by mouth daily.   DULoxetine (CYMBALTA) 60 MG capsule Take 60 mg by mouth daily.   escitalopram (LEXAPRO) 10 MG tablet Take 10 mg by mouth daily.   fexofenadine (ALLEGRA) 180 MG tablet Take 180 mg by mouth daily.   finasteride (PROSCAR) 5 MG tablet Take 1 tablet (5 mg total) by mouth daily.   gabapentin (NEURONTIN) 300 MG capsule Take 300 mg by mouth 2 (two) times daily.    glipiZIDE (GLUCOTROL XL) 5 MG 24 hr tablet Take 1 tablet (5 mg total) by mouth daily with breakfast.   glucose blood test strip 1 each by Other route daily before breakfast. Use as instructed    metFORMIN (GLUCOPHAGE-XR) 500 MG 24 hr tablet TAKE TWO TABLETS ('1000MG'$  TOTAL) BY MOUTHDAILY WITH BREAKFAST   pantoprazole (PROTONIX) 40 MG tablet TAKE ONE TABLET ('40MG'$  TOTAL) BY MOUTH TWO TIMES DAILY BEFORE A MEAL   sildenafil (VIAGRA) 50 MG tablet Take 50 mg by mouth daily.   sitaGLIPtin (JANUVIA) 50 MG tablet Take 1 tablet (50 mg total) by mouth daily.   tadalafil (CIALIS) 20 MG tablet Take 1 tablet (20 mg total) by mouth daily as needed for erectile dysfunction.   tamsulosin (FLOMAX) 0.4 MG CAPS capsule Take 0.4 mg by mouth daily.   traMADol (ULTRAM) 50 MG tablet Take 1 tablet (50 mg total) by mouth every 6 (six) hours as needed for moderate pain (pain.).   warfarin (COUMADIN) 5 MG tablet TAKE ONE TABLET BY MOUTH DAILY EXCEPT TAKE ONE AND ONE-HALF TABLETS ON SATURDAY OR AS DIRECTED                  Past Medical History:  Diagnosis Date   Barrett's esophagus    short-segment, diagnosed in 2007 by Dr. Gala [name]   BPH (benign prostatic hyperplasia)    CAD (coronary artery disease)    a. cath on 07/21/2019 showing patent LM and LCx with 40-60% Proximal LAD and 30-40% Pro-RCA stenosis with normal LV function.   GERD (gastroesophageal reflux disease)    Hiatal hernia    small   HTN (hypertension)    PAF (paroxysmal atrial fibrillation) ([place])    S/P colonoscopy 2007   unremarkable   S/P endoscopy Oct 2011    Salmon-colored epithelium coming up to 37 cm from the    Vertigo       Objective:     Wt Readings from Last 3 Encounters:  01/21/23 208 lb (94.3 kg)  01/11/23 205 lb (93 kg)  12/04/22 205 lb 6.4 oz (93.2 kg)      Vital signs reviewed  01/21/2023  - Note at rest 02 sats  97% on RA   General appearance:    somber amb elderly wm/ slt awkard gait     HEENT : Oropharynx  clear      Nasal turbinates nl    NECK :  without  apparent JVD/ palpable Nodes/TM    LUNGS: no acc muscle use,  Nl contour chest which is clear to A and P bilaterally without cough on insp or exp  maneuvers   CV:  RRR  no s3 or murmur or increase in P2, and no edema   ABD:  soft and nontender with nl inspiratory excursion in the supine position. No bruits or organomegaly appreciated   MS:  Nl gait/ ext warm without deformities Or obvious joint restrictions  calf tenderness, cyanosis or clubbing    SKIN: warm and dry  without lesions    NEURO:  alert, approp, nl sensorium with  no motor or cerebellar deficits apparent.      I personally reviewed images and agree with radiology impression as follows:  CXR:   pa and lateral  12/06/22 No active cardiopulmonary disease.     Labs ordered/ reviewed:      Chemistry      Component Value Date/Time   NA 141 11/30/2022 1504   K 4.3 11/30/2022 1504   CL 103 11/30/2022 1504   CO2 26 11/30/2022 1504   BUN 14 11/30/2022 1504   CREATININE 0.94 11/30/2022 1504   CREATININE 1.16 07/03/2019 1343      Component Value Date/Time   CALCIUM 9.1 11/30/2022 1504   ALKPHOS 72 11/30/2022 1504   AST 16 11/30/2022 1504   ALT 15 11/30/2022 1504   BILITOT 0.4 11/30/2022 1504        Lab Results  Component Value Date   WBC 10.0 10/24/2022   HGB 14.1 10/24/2022   HCT 39.8 10/24/2022   MCV 89.4 10/24/2022   PLT 193 10/24/2022       Lab Results  Component Value Date   TSH 2.420 05/25/2022         Assessment

## 2023-01-21 NOTE — Assessment & Plan Note (Signed)
Onset around 2019 assoc neuropathy - Stopped smoking 1994 with only chronic resp symptom p stopped = rhinitis better with allegrea - Echo 04/19/20  Nl lv, trivial MR with nl LA and RA  -  06/28/2020   Walked RA  approx   avg ft  @ 600 pace  stopped due to sob with sats still 97% and no orthostatic complaints    - Covid Thxgiving 2023  - 01/21/2023   Walked on RA  x  3  lap(s) =  approx 750  ft  @ mod/avg pace, stopped due to end of study with lowest 02 sats 95% s sob talking entire time   Strongly doubt he has significant residual ILD or airways dz from Covid at [place] 2023 and more likely this is just deconditioning which in terms is limited in terms of prognosis by his neuropathy and orthopedic issues in his legs   Advised on submax ex daily as tol x 4 weeks then return to the [facility] clinic for f/u and consideration of additional testing at that point   Discussed in detail all the  indications, usual  risks and alternatives  relative to the benefits with patient who agrees to proceed with conservative f/u as outlined    Each maintenance medication was reviewed in detail including emphasizing most importantly the difference between maintenance and prns and under what circumstances the prns are to be triggered using an action plan format where appropriate.  Total time for H and P, chart review, counseling,  directly observing portions of ambulatory 02 saturation study/ and generating customized AVS unique to this office visit / same day charting = 40 min with pt not seen by me in 2.5 years

## 2023-01-22 ENCOUNTER — Telehealth: Payer: Self-pay | Admitting: Orthopedic Surgery

## 2023-01-22 ENCOUNTER — Encounter: Payer: Self-pay | Admitting: Internal Medicine

## 2023-01-22 NOTE — Telephone Encounter (Signed)
Pt came into office, brace exchanged.

## 2023-01-22 NOTE — Telephone Encounter (Signed)
Patient lvm stating that the knee brace he got last week is too big, when he walks it falls down his leg.  He'd like a call back.  Pt's # [phone]

## 2023-01-23 ENCOUNTER — Ambulatory Visit: Payer: Medicare Other | Attending: Orthopedic Surgery | Admitting: *Deleted

## 2023-01-23 ENCOUNTER — Other Ambulatory Visit: Payer: Self-pay | Admitting: Nurse Practitioner

## 2023-01-23 DIAGNOSIS — Z5181 Encounter for therapeutic drug level monitoring: Secondary | ICD-10-CM

## 2023-01-23 DIAGNOSIS — I4891 Unspecified atrial fibrillation: Secondary | ICD-10-CM

## 2023-01-23 LAB — POCT INR: INR: 3.6 — AB (ref 2.0–3.0)

## 2023-01-23 MED ORDER — PIOGLITAZONE HCL 30 MG PO TABS: 30.0000 mg | ORAL_TABLET | Freq: Every day | ORAL | 0 refills | Status: DC

## 2023-01-23 NOTE — Patient Instructions (Signed)
Hold warfarin tonight then resume 1 tablet daily except 1 1/2 tablets on Tuesdays and Fridays Recheck in 3 wks

## 2023-02-11 ENCOUNTER — Telehealth: Payer: Self-pay | Admitting: Nurse Practitioner

## 2023-02-11 NOTE — Telephone Encounter (Signed)
Patient came by with concerns of glucose fluctuations.  He celebrated his birthday over the weekend and did indulge in sweets.  He also notes he still is eating Honey Nut Cheerios which is a processed grain and likely driving his numbers up.  I encouraged him to avoid the sugary products, even cough drops and advised him to increase his Meformin to 500 mg ER twice daily after meals (to help prevent GI upset).

## 2023-02-12 ENCOUNTER — Other Ambulatory Visit: Payer: Self-pay | Admitting: Nurse Practitioner

## 2023-02-13 ENCOUNTER — Ambulatory Visit (INDEPENDENT_AMBULATORY_CARE_PROVIDER_SITE_OTHER): Payer: Medicare Other | Admitting: *Deleted

## 2023-02-13 DIAGNOSIS — Z5181 Encounter for therapeutic drug level monitoring: Secondary | ICD-10-CM | POA: Diagnosis not present

## 2023-02-13 DIAGNOSIS — I4891 Unspecified atrial fibrillation: Secondary | ICD-10-CM

## 2023-02-13 LAB — POCT INR: INR: 2 (ref 2.0–3.0)

## 2023-02-13 NOTE — Patient Instructions (Signed)
Continue warfarin 1 tablet daily except 1 1/2 tablets on Tuesdays and Fridays Recheck in 4 wks 

## 2023-02-22 ENCOUNTER — Ambulatory Visit (INDEPENDENT_AMBULATORY_CARE_PROVIDER_SITE_OTHER): Payer: Medicare Other | Admitting: Orthopedic Surgery

## 2023-02-22 ENCOUNTER — Encounter: Payer: Self-pay | Admitting: Orthopedic Surgery

## 2023-02-22 DIAGNOSIS — G8929 Other chronic pain: Secondary | ICD-10-CM | POA: Diagnosis not present

## 2023-02-22 DIAGNOSIS — M25561 Pain in right knee: Secondary | ICD-10-CM

## 2023-02-22 NOTE — Progress Notes (Signed)
Orthopaedic Clinic Return  Assessment: [name] is a 82 y.o. male with the following: Right knee pain; atraumatic Insufficiency fracture of the right lateral femoral condyle  Plan: Mr. Bonadio pain has improved.  He has not been using the walker.  OK to DC use of the brace, can wear as needed.  Nothing further needed at this time.  He will contact the clinic if he has any issues.   Follow-up: Return if symptoms worsen or fail to improve.   Subjective:  Chief Complaint  Patient presents with   Knee Pain    R knee f/u after using brace.     History of Present Illness: [name] is a 82 y.o. male who returns to clinic for evaluation of right knee pain.  When I last saw him, he was advised to use a brace at all times and a walker when ambulating.  He has been using the brace, but not the walker.  His pain is better.  Recently, he has noted a popping sensation in the left knee, followed by some brief pain.  No swelling.  Pain is not constant.    Review of Systems: No fevers or chills No numbness or tingling No chest pain No shortness of breath No bowel or bladder dysfunction No GI distress No headaches   Objective: There were no vitals taken for this visit.  Physical Exam:  Right knee without effusion.  No tenderness to palpation over the lateral joint line.  No redness is appreciated.  Minimal crepitus with range of motion.  He is able to achieve full extension, with flexion beyond 90 degrees.  No increased laxity varus or valgus stress.  Negative Lachman.  Left knee without swelling.  Good range of motion in clinic today.   IMAGING: I personally ordered and reviewed the following images:  No new imaging.   Oliver Barre, MD 02/22/2023 11:52 PM

## 2023-02-24 NOTE — Progress Notes (Deleted)
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  quit smoking in 1994 and some problem with nasal congestion that resolved  On daily allegra  then around 2018/19 doe indolent onset assoc with bilateral neuropathy and chronic back pain  referred to pulmonary clinic in [place]  06/28/2020 by Dr Sherwood Gambler    History of Present Illness  06/28/2020  Pulmonary/ 1st office eval/Ileane Sando  Chief Complaint  Patient presents with   Consult    shortness of breath with exertion  Dyspnea:  Uses HC / does ok shopping but has to stop at top of basement steps where didn't previously assoc  feels light headed never supine  Cough: some in am  Sleep: on flat bed/ either side, 2 pillows  SABA use: none Rec We will need to set you with PFT's > not done  Covid p Thxgiving 2023 and cough since > dark initially   then  gradually lighter so by end of March minimal and white and mostly in am.   01/21/2023  f/u ov/Lula Michaux re: post covid cough   Chief Complaint  Patient presents with   Acute Visit    Pt states had covid 07 Oct 2022 and has had a cough since. He states he gets winded walking up a flight of stairs.Cough occurs in the am- non prod.   Dyspnea:  still stops at top of basement steps/ dizzy too was walking  Cough: minimal  Sleeping:  flat bed/ on side 2 pillows  SABA use:occ saba  02: none  Covid status:   vax x 3  Rec To get the most out of exercise, you need to be continuously aware that you are short of breath, but never out of breath, for at least 20-30 minutes daily.   Make sure you check your oxygen saturations at highest level of activity     02/25/2023  f/u ov/[place] office/Donata Reddick re: *** maint on ***  No chief complaint on file.   Dyspnea:  *** Cough: *** Sleeping: *** SABA use: *** 02: *** Covid status: *** Lung cancer screening: ***   No obvious day to day or daytime variability or assoc excess/  purulent sputum or mucus plugs or hemoptysis or cp or chest tightness, subjective wheeze or overt sinus or hb symptoms.   *** without nocturnal  or early am exacerbation  of respiratory  c/o's or need for noct saba. Also denies any obvious fluctuation of symptoms with weather or environmental changes or other aggravating or alleviating factors except as outlined above   No unusual exposure hx or h/o childhood pna/ asthma or knowledge of premature birth.  Current Allergies, Complete Past Medical History, Past Surgical History, Family History, and Social History were reviewed in [employer] record.  ROS  The following are not active complaints unless bolded Hoarseness, sore throat, dysphagia, dental problems, itching, sneezing,  nasal congestion or discharge of excess mucus or purulent secretions, ear ache,   fever, chills, sweats, unintended wt loss or wt gain, classically pleuritic or exertional cp,  orthopnea pnd or arm/hand swelling  or leg swelling, presyncope, palpitations, abdominal pain, anorexia, nausea, vomiting, diarrhea  or change in bowel habits or change in bladder habits, change in stools or change in urine, dysuria, hematuria,  rash, arthralgias, visual complaints, headache, numbness, weakness or ataxia or problems with walking or coordination,  change in mood or  memory.  No outpatient medications have been marked as taking for the 02/25/23 encounter (Appointment) with Nyoka Cowden, MD.                        Past Medical History:  Diagnosis Date   Barrett's esophagus    short-segment, diagnosed in 2007 by Dr. Jena Gauss   BPH (benign prostatic hyperplasia)    CAD (coronary artery disease)    a. cath on 07/21/2019 showing patent LM and LCx with 40-60% Proximal LAD and 30-40% Pro-RCA stenosis with normal LV function.   GERD (gastroesophageal reflux disease)    Hiatal hernia    small   HTN (hypertension)    PAF (paroxysmal atrial fibrillation)  ([facility])    S/P colonoscopy 2007   unremarkable   S/P endoscopy Oct 2011    Salmon-colored epithelium coming up to 37 cm from the    Vertigo       Objective:     02/25/2023         ***   01/21/23 208 lb (94.3 kg)  01/11/23 205 lb (93 kg)  12/04/22 205 lb 6.4 oz (93.2 kg)     Vital signs reviewed  02/25/2023  - Note at rest 02 sats  ***% on ***   General appearance:    ***                   Assessment

## 2023-02-25 ENCOUNTER — Ambulatory Visit: Payer: Medicare Other | Admitting: Internal Medicine

## 2023-02-27 DIAGNOSIS — J449 Chronic obstructive pulmonary disease, unspecified: Secondary | ICD-10-CM | POA: Diagnosis not present

## 2023-02-27 DIAGNOSIS — J069 Acute upper respiratory infection, unspecified: Secondary | ICD-10-CM | POA: Diagnosis not present

## 2023-02-27 DIAGNOSIS — Z6828 Body mass index (BMI) 28.0-28.9, adult: Secondary | ICD-10-CM | POA: Diagnosis not present

## 2023-02-27 DIAGNOSIS — Z20828 Contact with and (suspected) exposure to other viral communicable diseases: Secondary | ICD-10-CM | POA: Diagnosis not present

## 2023-02-27 DIAGNOSIS — Z1331 Encounter for screening for depression: Secondary | ICD-10-CM | POA: Diagnosis not present

## 2023-02-27 DIAGNOSIS — E663 Overweight: Secondary | ICD-10-CM | POA: Diagnosis not present

## 2023-02-27 DIAGNOSIS — R6889 Other general symptoms and signs: Secondary | ICD-10-CM | POA: Diagnosis not present

## 2023-02-27 DIAGNOSIS — Z0000 Encounter for general adult medical examination without abnormal findings: Secondary | ICD-10-CM | POA: Diagnosis not present

## 2023-03-01 ENCOUNTER — Other Ambulatory Visit: Payer: Self-pay | Admitting: Urology

## 2023-03-01 DIAGNOSIS — N138 Other obstructive and reflux uropathy: Secondary | ICD-10-CM

## 2023-03-04 ENCOUNTER — Encounter (HOSPITAL_COMMUNITY): Payer: Self-pay | Admitting: *Deleted

## 2023-03-04 ENCOUNTER — Other Ambulatory Visit: Payer: Self-pay

## 2023-03-04 ENCOUNTER — Emergency Department (HOSPITAL_COMMUNITY)
Admission: EM | Admit: 2023-03-04 | Discharge: 2023-03-04 | Disposition: A | Discharge status: Home / Self Care | Payer: Medicare Other | Attending: Emergency Medicine | Admitting: Emergency Medicine

## 2023-03-04 ENCOUNTER — Emergency Department (HOSPITAL_COMMUNITY): Payer: Medicare Other

## 2023-03-04 DIAGNOSIS — J40 Bronchitis, not specified as acute or chronic: Secondary | ICD-10-CM | POA: Diagnosis not present

## 2023-03-04 DIAGNOSIS — R0602 Shortness of breath: Secondary | ICD-10-CM | POA: Diagnosis not present

## 2023-03-04 DIAGNOSIS — J189 Pneumonia, unspecified organism: Secondary | ICD-10-CM | POA: Diagnosis not present

## 2023-03-04 DIAGNOSIS — Z7901 Long term (current) use of anticoagulants: Secondary | ICD-10-CM | POA: Insufficient documentation

## 2023-03-04 DIAGNOSIS — J449 Chronic obstructive pulmonary disease, unspecified: Secondary | ICD-10-CM | POA: Diagnosis not present

## 2023-03-04 DIAGNOSIS — E119 Type 2 diabetes mellitus without complications: Secondary | ICD-10-CM | POA: Insufficient documentation

## 2023-03-04 DIAGNOSIS — E6609 Other obesity due to excess calories: Secondary | ICD-10-CM | POA: Diagnosis not present

## 2023-03-04 DIAGNOSIS — Z20822 Contact with and (suspected) exposure to covid-19: Secondary | ICD-10-CM | POA: Diagnosis not present

## 2023-03-04 DIAGNOSIS — Z7984 Long term (current) use of oral hypoglycemic drugs: Secondary | ICD-10-CM | POA: Insufficient documentation

## 2023-03-04 DIAGNOSIS — R059 Cough, unspecified: Secondary | ICD-10-CM | POA: Diagnosis not present

## 2023-03-04 DIAGNOSIS — J069 Acute upper respiratory infection, unspecified: Secondary | ICD-10-CM | POA: Diagnosis not present

## 2023-03-04 DIAGNOSIS — Z6828 Body mass index (BMI) 28.0-28.9, adult: Secondary | ICD-10-CM | POA: Diagnosis not present

## 2023-03-04 LAB — CBC WITH DIFFERENTIAL/PLATELET
Abs Immature Granulocytes: 0.01 10*3/uL (ref 0.00–0.07)
Basophils Absolute: 0 10*3/uL (ref 0.0–0.1)
Basophils Relative: 1 %
Eosinophils Absolute: 0.2 10*3/uL (ref 0.0–0.5)
Eosinophils Relative: 3 %
HCT: 39 % (ref 39.0–52.0)
Hemoglobin: 12.9 g/dL — ABNORMAL LOW (ref 13.0–17.0)
Immature Granulocytes: 0 %
Lymphocytes Relative: 22 %
Lymphs Abs: 1 10*3/uL (ref 0.7–4.0)
MCH: 31 pg (ref 26.0–34.0)
MCHC: 33.1 g/dL (ref 30.0–36.0)
MCV: 93.8 fL (ref 80.0–100.0)
Monocytes Absolute: 0.4 10*3/uL (ref 0.1–1.0)
Monocytes Relative: 8 %
Neutro Abs: 2.9 10*3/uL (ref 1.7–7.7)
Neutrophils Relative %: 66 %
Platelets: 140 10*3/uL — ABNORMAL LOW (ref 150–400)
RBC: 4.16 MIL/uL — ABNORMAL LOW (ref 4.22–5.81)
RDW: 14.3 % (ref 11.5–15.5)
WBC: 4.4 10*3/uL (ref 4.0–10.5)
nRBC: 0 % (ref 0.0–0.2)

## 2023-03-04 LAB — TROPONIN I (HIGH SENSITIVITY)
Troponin I (High Sensitivity): 5 ng/L (ref <18)
Troponin I (High Sensitivity): 6 ng/L (ref <18)

## 2023-03-04 LAB — SARS CORONAVIRUS 2 BY RT PCR: SARS Coronavirus 2 by RT PCR: NEGATIVE

## 2023-03-04 LAB — BASIC METABOLIC PANEL
Anion gap: 5 (ref 5–15)
BUN: 15 mg/dL (ref 8–23)
CO2: 29 mmol/L (ref 22–32)
Calcium: 9 mg/dL (ref 8.9–10.3)
Chloride: 105 mmol/L (ref 98–111)
Creatinine, Ser: 1.05 mg/dL (ref 0.61–1.24)
GFR, Estimated: >60 mL/min (ref >60)
Glucose, Bld: 155 mg/dL — ABNORMAL HIGH (ref 70–99)
Potassium: 4.3 mmol/L (ref 3.5–5.1)
Sodium: 139 mmol/L (ref 135–145)

## 2023-03-04 LAB — URINALYSIS, ROUTINE W REFLEX MICROSCOPIC
Bilirubin Urine: NEGATIVE
Glucose, UA: NEGATIVE mg/dL
Hgb urine dipstick: NEGATIVE
Ketones, ur: NEGATIVE mg/dL
Leukocytes,Ua: NEGATIVE
Nitrite: NEGATIVE
Protein, ur: NEGATIVE mg/dL
Specific Gravity, Urine: 1.016 (ref 1.005–1.030)
pH: 7 (ref 5.0–8.0)

## 2023-03-04 LAB — LACTIC ACID, PLASMA: Lactic Acid, Venous: 1.3 mmol/L (ref 0.5–1.9)

## 2023-03-04 LAB — PROTIME-INR
INR: 1.9 — ABNORMAL HIGH (ref 0.8–1.2)
Prothrombin Time: 21.6 seconds — ABNORMAL HIGH (ref 11.4–15.2)

## 2023-03-04 LAB — BRAIN NATRIURETIC PEPTIDE: B Natriuretic Peptide: 83 pg/mL (ref 0.0–100.0)

## 2023-03-04 MED ORDER — ALBUTEROL SULFATE HFA 108 (90 BASE) MCG/ACT IN AERS
2.0000 | INHALATION_SPRAY | Freq: Once | RESPIRATORY_TRACT | Status: AC
  Administered 2023-03-04: 2 via RESPIRATORY_TRACT
  Filled 2023-03-04: qty 6.7

## 2023-03-04 MED ORDER — DOXYCYCLINE HYCLATE 100 MG PO CAPS: 100.0000 mg | ORAL_CAPSULE | Freq: Two times a day (BID) | ORAL | 0 refills | Status: DC

## 2023-03-04 MED ORDER — HYDROCOD POLI-CHLORPHE POLI ER 10-8 MG/5ML PO SUER: 5.0000 mL | Freq: Every evening | ORAL | 0 refills | Status: DC | PRN

## 2023-03-04 MED ORDER — IOHEXOL 300 MG/ML  SOLN
75.0000 mL | Freq: Once | INTRAMUSCULAR | Status: AC | PRN
  Administered 2023-03-04: 75 mL via INTRAVENOUS

## 2023-03-04 MED ORDER — HYDROCOD POLI-CHLORPHE POLI ER 10-8 MG PO CP12: 5.0000 mL | ORAL_CAPSULE | Freq: Every evening | ORAL | 0 refills | Status: DC | PRN

## 2023-03-04 NOTE — Discharge Instructions (Signed)
You were seen in the emergency department for continued cough shortness of breath.  You had blood work CAT scan and COVID testing that did not show an obvious explanation for your symptoms.  We will try you on another course of antibiotics and an albuterol inhaler.  We are also prescribing a nighttime cough medicine which may make you dizzy so please use caution.  Follow-up with your regular doctor.  Return to the emergency department if any worsening or concerning symptoms.

## 2023-03-04 NOTE — ED Provider Notes (Signed)
[place] EMERGENCY DEPARTMENT AT [facility] Provider Note   CSN: [id number] Arrival date & time: 03/04/23  1208     History {Add pertinent medical, surgical, social history, OB history to HPI:1} Chief Complaint  Patient presents with   Shortness of Breath    [name] is a 82 y.o. male.  He has a history of diabetes, paroxysmal A-fib, interstitial lung disease.  He is coming in with worsening cough shortness of breath that has been going on about 10 days.  Cough is been productive of yellow sputum and he gets pain in his chest with coughing.  No fevers.  He saw his PCP who put him on antibiotics but he does not know the name of it and does not feel any better.  He is on warfarin for his A-fib.  He has seen Dr. Sherene Sires in the past.  His wife said he has not really gotten better since he caught COVID back in the fall.  The history is provided by the patient and the spouse.  Shortness of Breath Severity:  Moderate Onset quality:  Gradual Duration:  10 days Timing:  Constant Progression:  Worsening Chronicity:  Recurrent Relieved by:  Nothing Worsened by:  Activity and coughing Ineffective treatments: antibiotics. Associated symptoms: chest pain, cough, sputum production and wheezing   Associated symptoms: no abdominal pain, no fever and no hemoptysis        Home Medications Prior to Admission medications   Medication Sig Start Date End Date Taking? Authorizing Provider  Accu-Chek FastClix Lancets MISC 1 application by Does not apply route 2 (two) times daily. 08/23/20   Dani Gobble, NP  allopurinol (ZYLOPRIM) 100 MG tablet Take 100 mg by mouth daily.  02/12/19   [provider]  ALPRAZolam Prudy Feeler) 1 MG tablet Take 0.5 mg by mouth at bedtime as needed for anxiety or sleep.    [provider]  benzonatate (TESSALON) 200 MG capsule Take 200 mg by mouth 3 (three) times daily. 10/08/22   [provider]  Cholecalciferol (VITAMIN D3) 50  MCG (2000 UT) TABS Take 2,000 Units by mouth daily.    [provider]  DULoxetine (CYMBALTA) 60 MG capsule Take 60 mg by mouth daily. 10/20/20   [provider]  escitalopram (LEXAPRO) 10 MG tablet Take 10 mg by mouth daily. 01/02/22   [provider]  fexofenadine (ALLEGRA) 180 MG tablet Take 180 mg by mouth daily.    [provider]  finasteride (PROSCAR) 5 MG tablet Take 1 tablet (5 mg total) by mouth daily. 02/01/22   Bjorn Pippin, MD  gabapentin (NEURONTIN) 300 MG capsule Take 300 mg by mouth 2 (two) times daily.     [provider]  glipiZIDE (GLUCOTROL XL) 5 MG 24 hr tablet Take 1 tablet (5 mg total) by mouth daily with breakfast. 01/10/23   Dani Gobble, NP  glucose blood test strip 1 each by Other route daily before breakfast. Use as instructed 09/26/20   Dani Gobble, NP  metFORMIN (GLUCOPHAGE-XR) 500 MG 24 hr tablet Take 1 tablet (500 mg total) by mouth 2 (two) times daily with a meal. 02/12/23   Dani Gobble, NP  pantoprazole (PROTONIX) 40 MG tablet TAKE ONE TABLET (  TOTAL) BY MOUTH TWO TIMES DAILY BEFORE A MEAL 04/19/22   Tiffany Kocher, PA-C  pioglitazone (ACTOS) 30 MG tablet Take 1 tablet (30 mg total) by mouth daily. 01/23/23   Dani Gobble, NP  rosuvastatin (CRESTOR)  10 MG tablet TAKE ONE TABLET (  TOTAL) BY MOUTH DAILY 12/07/22   Jonelle Sidle, MD  sildenafil (VIAGRA) 50 MG tablet Take 50 mg by mouth daily. 12/21/21   [provider]  tadalafil (CIALIS) 20 MG tablet Take 1 tablet (20 mg total) by mouth daily as needed for erectile dysfunction. 06/07/22   Bjorn Pippin, MD  tamsulosin (FLOMAX) 0.4 MG CAPS capsule TAKE ONE CAPSULE (0.4MG  TOTAL) BY MOUTH TWO TIMES DAILY 03/04/23   Bjorn Pippin, MD  traMADol (ULTRAM) 50 MG tablet Take 1 tablet (50 mg total) by mouth every 6 (six) hours as needed for moderate pain (pain.). 08/18/20   Bjorn Pippin, MD  warfarin (COUMADIN) 5 MG tablet TAKE ONE TABLET BY MOUTH DAILY  EXCEPT TAKE ONE AND ONE-HALF TABLETS ON SATURDAY OR AS DIRECTED 04/09/22   Council Mechanic, NP      Allergies    Prednisone    Review of Systems   Review of Systems  Constitutional:  Negative for fever.  Eyes:  Negative for visual disturbance.  Respiratory:  Positive for cough, sputum production, shortness of breath and wheezing. Negative for hemoptysis.   Cardiovascular:  Positive for chest pain.  Gastrointestinal:  Negative for abdominal pain.    Physical Exam Updated Vital Signs BP 139/72 (BP Location: Right Arm)   Pulse 75   Temp 97.8 F (36.6 C)   Resp (!) 22   SpO2 98%  Physical Exam Vitals and nursing note reviewed.  Constitutional:      General: He is not in acute distress.    Appearance: He is well-developed.  HENT:     Head: Normocephalic and atraumatic.  Eyes:     Conjunctiva/sclera: Conjunctivae normal.  Cardiovascular:     Rate and Rhythm: Normal rate and regular rhythm.     Heart sounds: No murmur heard. Pulmonary:     Effort: Accessory muscle usage present. No respiratory distress.     Breath sounds: Normal breath sounds.  Abdominal:     Palpations: Abdomen is soft.     Tenderness: There is no abdominal tenderness.  Musculoskeletal:        General: No swelling. Normal range of motion.     Cervical back: Neck supple.     Right lower leg: No tenderness. No edema.     Left lower leg: No tenderness. No edema.  Skin:    General: Skin is warm and dry.     Capillary Refill: Capillary refill takes less than 2 seconds.  Neurological:     General: No focal deficit present.     Mental Status: He is alert.     ED Results / Procedures / Treatments   Labs (all labs ordered are listed, but only abnormal results are displayed) Labs Reviewed  CBC WITH DIFFERENTIAL/PLATELET - Abnormal; Notable for the following components:      Result Value   RBC 4.16 (*)    Hemoglobin 12.9 (*)    Platelets 140 (*)    All other components within normal limits  BASIC  METABOLIC PANEL - Abnormal; Notable for the following components:   Glucose, Bld 155 (*)    All other components within normal limits    EKG None  Radiology DG Chest Portable 1 View  Result Date: 03/04/2023 CLINICAL DATA:  Short of breath.  Cough. EXAM: PORTABLE CHEST 1 VIEW COMPARISON:  12/06/2022.  CT, 07/10/2019. FINDINGS: Cardiac silhouette is normal in size and configuration. No mediastinal or hilar masses. No evidence of adenopathy. Bilateral interstitial  prominence. No lung consolidation or edema. No pleural effusion or pneumothorax. Skeletal structures are intact. IMPRESSION: No active disease. Electronically Signed   By: Amie Portland M.D.   On: 03/04/2023 13:04    Procedures Procedures  {Document cardiac monitor, telemetry assessment procedure when appropriate:1}  Medications Ordered in ED Medications - No data to display  ED Course/ Medical Decision Making/ A&P   {   Click here for ABCD2, HEART and other calculatorsREFRESH Note before signing :1}                          Medical Decision Making Amount and/or Complexity of Data Reviewed Labs: ordered. Radiology: ordered.   This patient complains of ***; this involves an extensive number of treatment Options and is a complaint that carries with it a high risk of complications and morbidity. The differential includes ***  I ordered, reviewed and interpreted labs, which included *** I ordered medication *** and reviewed PMP when indicated. I ordered imaging studies which included *** and I independently    visualized and interpreted imaging which showed *** Additional history obtained from *** Previous records obtained and reviewed *** I consulted *** and discussed lab and imaging findings and discussed disposition.  Cardiac monitoring reviewed, *** Social determinants considered, *** Critical Interventions: ***  After the interventions stated above, I reevaluated the patient and found *** Admission and further  testing considered, ***   {Document critical care time when appropriate:1} {Document review of labs and clinical decision tools ie heart score, Chads2Vasc2 etc:1}  {Document your independent review of radiology images, and any outside records:1} {Document your discussion with family members, caretakers, and with consultants:1} {Document social determinants of health affecting pt's care:1} {Document your decision making why or why not admission, treatments were needed:1} Final Clinical Impression(s) / ED Diagnoses Final diagnoses:  None    Rx / DC Orders ED Discharge Orders     None

## 2023-03-04 NOTE — ED Notes (Signed)
While pt stood up O2 was 96 % as we walked 40 steps O2 stayed between 96-98, walked 40 more steps O2 stayed in between 96-98, Pt stayed that he felt fine but that pt's cough was the only thing that bothered pt.

## 2023-03-04 NOTE — ED Triage Notes (Signed)
Pt instructed to go to ED by his PCP for PNA. Pt with SOB and cough, denies any fever. Pt states he has been on antibiotic since last Wednesday and cough med.

## 2023-03-04 NOTE — ED Notes (Signed)
Patient transported to CT 

## 2023-03-05 ENCOUNTER — Encounter: Payer: Self-pay | Admitting: Nurse Practitioner

## 2023-03-05 ENCOUNTER — Ambulatory Visit (INDEPENDENT_AMBULATORY_CARE_PROVIDER_SITE_OTHER): Payer: Medicare Other | Admitting: Nurse Practitioner

## 2023-03-05 VITALS — BP 126/80 | HR 67 | Ht 69.0 in | Wt 213.8 lb

## 2023-03-05 DIAGNOSIS — R5383 Other fatigue: Secondary | ICD-10-CM

## 2023-03-05 DIAGNOSIS — E559 Vitamin D deficiency, unspecified: Secondary | ICD-10-CM | POA: Diagnosis not present

## 2023-03-05 DIAGNOSIS — E1165 Type 2 diabetes mellitus with hyperglycemia: Secondary | ICD-10-CM

## 2023-03-05 LAB — POCT GLYCOSYLATED HEMOGLOBIN (HGB A1C): Hemoglobin A1C: 7.9 % — AB (ref 4.0–5.6)

## 2023-03-05 MED ORDER — RYBELSUS 3 MG PO TABS: 3.0000 mg | ORAL_TABLET | Freq: Every day | ORAL | 0 refills | Status: DC

## 2023-03-05 NOTE — Addendum Note (Signed)
Addended by: Shona Needles on: 03/05/2023 03:11 PM   Modules accepted: Orders

## 2023-03-05 NOTE — Patient Instructions (Signed)
Will try Rybelsus 3 mg po daily before breakfast.  This will take place of Actos if it is affordable.  If the price is too high, do not pick it up from the pharmacy and proceed with filling out the [employer] Patient assistance program forms to see if you qualify for financial assistance in paying for it.

## 2023-03-05 NOTE — Progress Notes (Signed)
Endocrinology Follow Up Visit      03/05/2023, 2:59 PM      Subjective:    Patient ID: [name], male    DOB: [birth year].  [name] is being seen in follow up after being seen in consultation for management of currently uncontrolled symptomatic diabetes requested by  Elfredia Nevins, MD.  He is a retired Emergency planning/management officer for [employer].   Past Medical History:  Diagnosis Date   Aortic atherosclerosis    Barrett's esophagus    Short-segment, diagnosed in 2007 by Dr. Jena Gauss   BPH (benign prostatic hyperplasia)    CAD (coronary artery disease)    a. cath on 07/21/2019 showing patent LM and LCx with 40-60% Proximal LAD and 30-40% Pro-RCA stenosis with normal LV function.   Essential hypertension    GERD (gastroesophageal reflux disease)    Hiatal hernia    History of kidney stones    PAF (paroxysmal atrial fibrillation)    S/P colonoscopy 2007   S/P endoscopy 08/2010    Salmon-colored epithelium coming up to 37 cm from the    Type 2 diabetes mellitus    Vertigo     Past Surgical History:  Procedure Laterality Date   APPENDECTOMY     BIOPSY  01/28/2020   Procedure: BIOPSY;  Surgeon: Corbin Ade, MD;  [facility];  Service: Endoscopy;;  esophagus   CHOLECYSTECTOMY     COLONOSCOPY  02/06/2006   Dr. Jena Gauss- single anal papilla, o/w normal rectum, normal colon   COLONOSCOPY N/A 09/13/2016   RMR: three 5-6 mm for adenomas removed.  Recommend one last surveillance colonoscopy in October 2020.   COLONOSCOPY WITH PROPOFOL N/A 01/28/2020   Procedure: COLONOSCOPY WITH PROPOFOL;  Surgeon: Corbin Ade, MD;  Location: [facility];  Service: Endoscopy;  Laterality: N/A;  10:45am   CORONARY PRESSURE/FFR STUDY N/A 07/21/2019   Procedure: INTRAVASCULAR PRESSURE WIRE/FFR STUDY;  Surgeon: Lyn Records, MD;  [facility]: [facility];  Service: Cardiovascular;   Laterality: N/A;   CYSTOSCOPY WITH RETROGRADE PYELOGRAM, URETEROSCOPY AND STENT PLACEMENT Right 08/18/2020   Procedure: CYSTOSCOPY WITH INCISION OF RIGHT URETEROCELE, URETEROSCOPY WITH HOLMIUM LASER, CYSTOLITHOLAPAXY OF 10 MM BLADDER STONE;  Surgeon: Bjorn Pippin, MD;  Location: [facility];  Service: Urology;  Laterality: Right;   ESOPHAGOGASTRODUODENOSCOPY  09/15/2010   Dr. Jena Gauss- Salmon-colored epithelium coming up to 37 cm from the    ESOPHAGOGASTRODUODENOSCOPY N/A 07/02/2013   Dr. Jena Gauss- Barretts on bx. hiatal hernia   ESOPHAGOGASTRODUODENOSCOPY N/A 09/13/2016   RMR: Barrett's esophagus without dysplasia. one last EGD in 3 years.    ESOPHAGOGASTRODUODENOSCOPY (EGD) WITH PROPOFOL N/A 01/28/2020   Procedure: ESOPHAGOGASTRODUODENOSCOPY (EGD) WITH PROPOFOL;  Surgeon: Corbin Ade, MD;  [facility];  Service: Endoscopy;  Laterality: N/A;   LEFT HEART CATH AND CORONARY ANGIOGRAPHY N/A 07/21/2019   Procedure: LEFT HEART CATH AND CORONARY ANGIOGRAPHY;  Surgeon: Lyn Records, MD;  [facility];  Service: Cardiovascular;  Laterality: N/A;   POLYPECTOMY  01/28/2020   Procedure: POLYPECTOMY;  Surgeon: Corbin Ade, MD;  [facility];  Service: Endoscopy;;  colon   ROTATOR CUFF REPAIR     YAG LASER APPLICATION Right 12/20/2014   Procedure: YAG LASER APPLICATION;  Surgeon: Susa Simmonds, MD;  Location: [facility];  Service: Ophthalmology;  Laterality: Right;    Social History   Socioeconomic History   Marital status: Married    Spouse name: Not on file   Number of children: Not on file   Years of education: Not on file   Highest education level: Not on file  Occupational History   Not on file  Tobacco Use   Smoking status: Former    Packs/day: 1.50    Years: 30.00    Additional pack years: 0.00    Total pack years: 45.00    Types: Cigarettes    Quit date: 11/18/1993    Years since quitting: 29.3   Smokeless tobacco: Never   Tobacco comments:    quit in  the 90's  Vaping Use   Vaping Use: Never used  Substance and Sexual Activity   Alcohol use: No   Drug use: No   Sexual activity: Not on file  Other Topics Concern   Not on file  Social History Narrative   Not on file   Social Determinants of Health   Financial Resource Strain: Not on file  Food Insecurity: Not on file  Transportation Needs: Not on file  Physical Activity: Not on file  Stress: Not on file  Social Connections: Not on file    Family History  Problem Relation Age of Onset   Prostate cancer Father        deceased   Stroke Mother        deceased   Colon cancer Neg Hx     Outpatient Encounter Medications as of 03/05/2023  Medication Sig   Accu-Chek FastClix Lancets MISC 1 application by Does not apply route 2 (two) times daily.   allopurinol (ZYLOPRIM) 100 MG tablet Take 100 mg by mouth daily.    ALPRAZolam (XANAX) 1 MG tablet Take 0.5 mg by mouth at bedtime as needed for anxiety or sleep.   amoxicillin (AMOXIL) 875 MG tablet Take 875 mg by mouth 2 (two) times daily.   benzonatate (TESSALON) 200 MG capsule Take 200 mg by mouth 3 (three) times daily.   Cholecalciferol (VITAMIN D3) 50 MCG (2000 UT) TABS Take 2,000 Units by mouth daily.   doxycycline (VIBRAMYCIN) 100 MG capsule Take 1 capsule (100 mg total) by mouth 2 (two) times daily.   DULoxetine (CYMBALTA) 60 MG capsule Take 60 mg by mouth daily.   escitalopram (LEXAPRO) 10 MG tablet Take 10 mg by mouth daily.   fexofenadine (ALLEGRA) 180 MG tablet Take 180 mg by mouth daily.   finasteride (PROSCAR) 5 MG tablet Take 1 tablet (5 mg total) by mouth daily.   gabapentin (NEURONTIN) 300 MG capsule Take 300 mg by mouth 2 (two) times daily.    glipiZIDE (GLUCOTROL XL) 5 MG 24 hr tablet Take 1 tablet (5 mg total) by mouth daily with breakfast.   glucose blood test strip 1 each by Other route daily before breakfast. Use as instructed   Hydrocod Poli-Chlorphe Poli ER 10-8 MG CP12 Take 5 mLs by mouth at bedtime as  needed.   metFORMIN (GLUCOPHAGE-XR) 500 MG 24 hr tablet Take 1 tablet (500 mg total) by mouth 2 (two) times daily with a meal.   pantoprazole (PROTONIX) 40 MG tablet TAKE ONE TABLET (40MG  TOTAL) BY MOUTH TWO TIMES DAILY BEFORE A MEAL  pioglitazone (ACTOS) 30 MG tablet Take 1 tablet (30 mg total) by mouth daily.   rosuvastatin (CRESTOR) 10 MG tablet TAKE ONE TABLET (10MG  TOTAL) BY MOUTH DAILY   Semaglutide (RYBELSUS) 3 MG TABS Take 1 tablet (3 mg total) by mouth daily.   sildenafil (VIAGRA) 50 MG tablet Take 50 mg by mouth daily.   tadalafil (CIALIS) 20 MG tablet Take 1 tablet (20 mg total) by mouth daily as needed for erectile dysfunction.   tamsulosin (FLOMAX) 0.4 MG CAPS capsule TAKE ONE CAPSULE (0.4MG  TOTAL) BY MOUTH TWO TIMES DAILY   traMADol (ULTRAM) 50 MG tablet Take 1 tablet (50 mg total) by mouth every 6 (six) hours as needed for moderate pain (pain.).   warfarin (COUMADIN) 5 MG tablet TAKE ONE TABLET BY MOUTH DAILY EXCEPT TAKE ONE AND ONE-HALF TABLETS ON SATURDAY OR AS DIRECTED   No facility-administered encounter medications on file as of 03/05/2023.    ALLERGIES: Allergies  Allergen Reactions   Prednisone     Patient state that this medication made his blood sugar go real high    VACCINATION STATUS: Immunization History  Administered Date(s) Administered   Influenza-Unspecified 09/19/2013, 08/19/2022    Diabetes He presents for his follow-up diabetic visit. He has type 2 diabetes mellitus. Onset time: diagnosed at approximate age of 30. His disease course has been stable. There are no hypoglycemic associated symptoms. Pertinent negatives for hypoglycemia include no confusion, headaches, seizures or tremors. Associated symptoms include fatigue and foot paresthesias. Pertinent negatives for diabetes include no chest pain, no foot ulcerations, no polydipsia, no polyuria, no visual change and no weight loss. There are no hypoglycemic complications. Symptoms are stable. Diabetic  complications include heart disease and peripheral neuropathy. Risk factors for coronary artery disease include diabetes mellitus, dyslipidemia, hypertension, male sex, obesity and sedentary lifestyle. Current diabetic treatment includes oral agent (triple therapy). He is compliant with treatment most of the time. His weight is fluctuating minimally. He is following a generally unhealthy diet. When asked about meal planning, he reported none. He has not had a previous visit with a dietitian. He rarely participates in exercise. His home blood glucose trend is fluctuating minimally. His overall blood glucose range is 180-200 mg/dl. (He presents today with his meter showing above target glycemic profile.  His POCT A1c today is 7.9%, improving from last visit. Analysis of his CGM shows GMI of 8.5%, TIR 36%, TAR 64%, TBR 0%.  He notes he does use cough drops frequently and is now being treated for bronchitis.) An ACE inhibitor/angiotensin II receptor blocker is not being taken. He does not see a podiatrist.Eye exam is current.    Review of systems  Constitutional: + minimally fluctuating body weight,  current Body mass index is 31.57 kg/m. ,+ fatigue, no subjective hyperthermia, no subjective hypothermia Eyes: no blurry vision, no xerophthalmia ENT: no sore throat, no nodules palpated in throat, no dysphagia/odynophagia, no hoarseness Cardiovascular: no chest pain, no shortness of breath, no palpitations, + ankle swelling Respiratory: + cough (went to ED for bronchitis), no shortness of breath Gastrointestinal: no nausea/vomiting/diarrhea Musculoskeletal: no muscle/joint aches Skin: no rashes, no hyperemia Neurological: no tremors, + numbness/tingling to BLE, no dizziness Psychiatric: no depression, no anxiety, + insomnia   Objective:    BP 126/80 (BP Location: Right Arm, Patient Position: Sitting, Cuff Size: Large)   Pulse 67   Ht 5\' 9"  (1.753 m)   Wt 213 lb 12.8 oz (97 kg)   BMI 31.57 kg/m    Wt Readings from  Last 3 Encounters:  03/05/23 213 lb 12.8 oz (97 kg)  01/21/23 208 lb (94.3 kg)  01/11/23 205 lb (93 kg)    BP Readings from Last 3 Encounters:  03/05/23 126/80  03/04/23 (!) 145/76  01/21/23 110/70     Physical Exam- Limited  Constitutional:  Body mass index is 31.57 kg/m. , not in acute distress, normal state of mind Eyes:  EOMI, no exophthalmos Musculoskeletal: no gross deformities, strength intact in all four extremities, no gross restriction of joint movements Skin:  no rashes, no hyperemia Neurological: no tremor with outstretched hands   Diabetic Foot Exam - Simple   No data filed     CMP ( most recent) CMP     Component Value Date/Time   NA 139 03/04/2023 1311   NA 141 11/30/2022 1504   K 4.3 03/04/2023 1311   CL 105 03/04/2023 1311   CO2 29 03/04/2023 1311   GLUCOSE 155 (H) 03/04/2023 1311   BUN 15 03/04/2023 1311   BUN 14 11/30/2022 1504   CREATININE 1.05 03/04/2023 1311   CREATININE 1.16 07/03/2019 1343   CALCIUM 9.0 03/04/2023 1311   PROT 6.2 11/30/2022 1504   ALBUMIN 4.1 11/30/2022 1504   AST 16 11/30/2022 1504   ALT 15 11/30/2022 1504   ALKPHOS 72 11/30/2022 1504   BILITOT 0.4 11/30/2022 1504   GFRNONAA >60 03/04/2023 1311   GFRAA >60 08/12/2020 1336     Diabetic Labs (most recent): Lab Results  Component Value Date   HGBA1C 8.4 (A) 12/04/2022   HGBA1C 8.3 (A) 08/30/2022   HGBA1C 7.9 (H) 04/01/2022   MICROALBUR 10 02/21/2022   MICROALBUR 30 02/23/2021     Lipid Panel ( most recent) Lipid Panel  No results found for: "CHOL", "TRIG", "HDL", "CHOLHDL", "VLDL", "LDLCALC", "LDLDIRECT", "LABVLDL"    Lab Results  Component Value Date   TSH 2.420 05/25/2022   TSH 3.235 05/02/2018   FREET4 1.10 05/25/2022         Assessment & Plan:   1) Type 2 diabetes mellitus with hyperglycemia, without long-term current use of insulin (HCC)  He presents today with his meter showing above target glycemic profile.  His POCT A1c  today is 7.9%, improving from last visit. Analysis of his CGM shows GMI of 8.5%, TIR 36%, TAR 64%, TBR 0%.  He notes he does use cough drops frequently and is now being treated for bronchitis.  - [name] has currently uncontrolled symptomatic type 2 DM since 82 years of age.   Recent labs reviewed.  - I had a long discussion with him about the progressive nature of diabetes and the pathology behind its complications.  -his diabetes is complicated by CAD, peripheral neuropathy and he remains at a high risk for more acute and chronic complications which include CAD, CVA, CKD, retinopathy, and neuropathy. These are all discussed in detail with him.  - Nutritional counseling repeated at each appointment due to patients tendency to fall back in to old habits.  - The patient admits there is a room for improvement in their diet and drink choices. -  Suggestion is made for the patient to avoid simple carbohydrates from their diet including Cakes, Sweet Desserts / Pastries, Ice Cream, Soda (diet and regular), Sweet Tea, Candies, Chips, Cookies, Sweet Pastries, Store Bought Juices, Alcohol in Excess of 1-2 drinks a day, Artificial Sweeteners, Coffee Creamer, and "Sugar-free" Products. This will help patient to have stable blood glucose profile and potentially avoid unintended weight gain.   -  I encouraged the patient to switch to unprocessed or minimally processed complex starch and increased protein intake (animal or plant source), fruits, and vegetables.   - Patient is advised to stick to a routine mealtimes to eat 3 meals a day and avoid unnecessary snacks (to snack only to correct hypoglycemia).  - I have approached him with the following individualized plan to manage  his diabetes and patient agrees:   -He is advised to continue his Metformin 1000 mg ER daily with breakfast, Glipizide 5 mg po daily with breakfast, and Actos 30 mg po daily (insurance would not cover Januvia).  I do want to try  getting him on Rybelsus 3 mg po daily before breakfast.  This will take place of his Actos which I suspect may be causing his recent ankle swelling.  I sent this to the pharmacy for him but told him not to pick it up if it is too expensive.  I did also give him PAP paperwork for financial assistance with his Rybelsus.  -He is encouraged to monitor blood glucose at least twice daily (using his CGM), before breakfast and before bed, and to notify the clinic if his glucose readings are less than 70 or above 300 for 3 tests in a row.  He has benefited from CGM device, is advised to continue wearing it.  - Specific targets for  A1c;  LDL, HDL,  and Triglycerides were discussed with the patient.  2) Blood Pressure /Hypertension:  His blood pressure is controlled to target without the use of antihypertensive medications.  3) Lipids/Hyperlipidemia:  There are no recent lipid panel results to review, nor does he take any medication for high cholesterol.  He reports he has had his cholesterol checked at his PCP, although no records have been sent to [place].   4)  Weight/Diet:  His Body mass index is 31.57 kg/m.  - complicating his diabetes care.   he is a candidate for some weight loss. I discussed with him the fact that loss of 5 - 10% of his  current body weight will have the most impact on his diabetes management.  Exercise, and detailed carbohydrates information provided  -  detailed on discharge instructions.  5) Chronic Care/Health Maintenance: -he is encouraged to initiate and continue to follow up with Ophthalmology, Dentist,  Podiatrist at least yearly or according to recommendations, and advised to stay away from smoking and/or secondhand smoke exposure. I have recommended yearly flu vaccine and pneumonia vaccine at least every 5 years; moderate intensity exercise for up to 150 minutes weekly; and  sleep for at least 7 hours a day.    - he is advised to maintain close follow up with Elfredia Nevins,  MD for primary care needs, as well as his other providers for optimal and coordinated care.  I did encourage him to restart his Allegra daily for allergies as it may be a potential trigger for his recent bronchitis.  He is advised to follow up with his pulmonologist as well.     I spent  46  minutes in the care of the patient today including review of labs from CMP, Lipids, Thyroid Function, Hematology (current and previous including abstractions from other facilities); face-to-face time discussing  his blood glucose readings/logs, discussing hypoglycemia and hyperglycemia episodes and symptoms, medications doses, his options of short and long term treatment based on the latest standards of care / guidelines;  discussion about incorporating lifestyle medicine;  and documenting the encounter. Risk reduction counseling  performed per USPSTF guidelines to reduce obesity and cardiovascular risk factors.     Please refer to Patient Instructions for Blood Glucose Monitoring and Insulin/Medications Dosing Guide"  in media tab for additional information. Please  also refer to " Patient Self Inventory" in the Media  tab for reviewed elements of pertinent patient history.  [name] participated in the discussions, expressed understanding, and voiced agreement with the above plans.  All questions were answered to his satisfaction. he is encouraged to contact clinic should he have any questions or concerns prior to his return visit.    Follow up plan: - Return in about 3 months (around 06/04/2023) for Diabetes F/U with A1c in office, No previsit labs, Bring meter and logs.  [name], [facility] [address] [place], [place] [address] Phone: [phone] Fax: [phone]  03/05/2023, 2:59 PM

## 2023-03-06 LAB — CULTURE, BLOOD (ROUTINE X 2): Culture: NO GROWTH

## 2023-03-07 ENCOUNTER — Ambulatory Visit: Payer: Medicare Other | Admitting: Urology

## 2023-03-07 ENCOUNTER — Telehealth: Payer: Self-pay

## 2023-03-07 LAB — CULTURE, BLOOD (ROUTINE X 2)

## 2023-03-07 NOTE — Telephone Encounter (Signed)
     Patient  visit on 4/15  at [facility]   Have you been able to follow up with your primary care physician? Yes   The patient was or was not able to obtain any needed medicine or equipment. Yes    Are there diet recommendations that you are having difficulty following?  Patient expresses understanding of discharge instructions and education provided has no other needs at this time.  Yes      Lenard Forth [facility] Guide, [facility] [phone] [address] [place], [place], [place] [address] Phone: [phone] Email: [email or web address]

## 2023-03-08 LAB — CULTURE, BLOOD (ROUTINE X 2)

## 2023-03-09 LAB — CULTURE, BLOOD (ROUTINE X 2): Culture: NO GROWTH

## 2023-03-14 ENCOUNTER — Other Ambulatory Visit: Payer: Self-pay | Admitting: Nurse Practitioner

## 2023-03-20 ENCOUNTER — Other Ambulatory Visit: Payer: Self-pay | Admitting: Urology

## 2023-03-20 DIAGNOSIS — N401 Enlarged prostate with lower urinary tract symptoms: Secondary | ICD-10-CM

## 2023-03-21 DIAGNOSIS — E663 Overweight: Secondary | ICD-10-CM | POA: Diagnosis not present

## 2023-03-21 DIAGNOSIS — H6121 Impacted cerumen, right ear: Secondary | ICD-10-CM | POA: Diagnosis not present

## 2023-03-21 DIAGNOSIS — Z6828 Body mass index (BMI) 28.0-28.9, adult: Secondary | ICD-10-CM | POA: Diagnosis not present

## 2023-03-25 ENCOUNTER — Ambulatory Visit: Payer: Medicare Other | Attending: Orthopedic Surgery | Admitting: *Deleted

## 2023-03-25 DIAGNOSIS — Z5181 Encounter for therapeutic drug level monitoring: Secondary | ICD-10-CM | POA: Insufficient documentation

## 2023-03-25 DIAGNOSIS — I4891 Unspecified atrial fibrillation: Secondary | ICD-10-CM | POA: Insufficient documentation

## 2023-03-25 LAB — POCT INR: INR: 2.7 (ref 2.0–3.0)

## 2023-03-25 NOTE — Patient Instructions (Signed)
Continue warfarin 1 tablet daily except 1 1/2 tablets on Tuesdays and Fridays Recheck in 4 wks 

## 2023-03-27 ENCOUNTER — Other Ambulatory Visit: Payer: Self-pay | Admitting: Gastroenterology

## 2023-04-09 ENCOUNTER — Telehealth: Payer: Self-pay | Admitting: Nurse Practitioner

## 2023-04-09 NOTE — Telephone Encounter (Signed)
Called pt and let him know that his pt assistance Rybelsus is here for p/u

## 2023-04-16 ENCOUNTER — Telehealth: Payer: Self-pay | Admitting: Nurse Practitioner

## 2023-04-16 NOTE — Telephone Encounter (Signed)
Pt came by and said ever since he started the Rybelus, he has been having vertigo. Please advise if pt should stop this medication?

## 2023-04-17 NOTE — Telephone Encounter (Signed)
Patient was called and given Dr. Isidoro Donning recommendation. This note will also be sent to [facility] for review/recommendation.

## 2023-04-22 DIAGNOSIS — I779 Disorder of arteries and arterioles, unspecified: Secondary | ICD-10-CM | POA: Diagnosis not present

## 2023-04-22 DIAGNOSIS — E559 Vitamin D deficiency, unspecified: Secondary | ICD-10-CM | POA: Diagnosis not present

## 2023-04-22 DIAGNOSIS — I480 Paroxysmal atrial fibrillation: Secondary | ICD-10-CM | POA: Diagnosis not present

## 2023-04-22 DIAGNOSIS — N400 Enlarged prostate without lower urinary tract symptoms: Secondary | ICD-10-CM | POA: Diagnosis not present

## 2023-04-22 DIAGNOSIS — E1140 Type 2 diabetes mellitus with diabetic neuropathy, unspecified: Secondary | ICD-10-CM | POA: Diagnosis not present

## 2023-04-22 DIAGNOSIS — Z20828 Contact with and (suspected) exposure to other viral communicable diseases: Secondary | ICD-10-CM | POA: Diagnosis not present

## 2023-04-22 DIAGNOSIS — I700 Atherosclerosis of aorta: Secondary | ICD-10-CM | POA: Diagnosis not present

## 2023-04-22 DIAGNOSIS — R6889 Other general symptoms and signs: Secondary | ICD-10-CM | POA: Diagnosis not present

## 2023-04-22 DIAGNOSIS — Z6829 Body mass index (BMI) 29.0-29.9, adult: Secondary | ICD-10-CM | POA: Diagnosis not present

## 2023-04-22 DIAGNOSIS — E663 Overweight: Secondary | ICD-10-CM | POA: Diagnosis not present

## 2023-04-22 DIAGNOSIS — J329 Chronic sinusitis, unspecified: Secondary | ICD-10-CM | POA: Diagnosis not present

## 2023-04-24 ENCOUNTER — Ambulatory Visit: Payer: Medicare Other | Attending: Orthopedic Surgery | Admitting: *Deleted

## 2023-04-24 ENCOUNTER — Other Ambulatory Visit: Payer: Self-pay | Admitting: Nurse Practitioner

## 2023-04-24 ENCOUNTER — Telehealth: Payer: Self-pay | Admitting: Nurse Practitioner

## 2023-04-24 DIAGNOSIS — Z5181 Encounter for therapeutic drug level monitoring: Secondary | ICD-10-CM | POA: Diagnosis not present

## 2023-04-24 DIAGNOSIS — I4891 Unspecified atrial fibrillation: Secondary | ICD-10-CM | POA: Diagnosis not present

## 2023-04-24 LAB — POCT INR: INR: 1.8 — AB (ref 2.0–3.0)

## 2023-04-24 NOTE — Patient Instructions (Signed)
Take warfarin 1 1/2 tablets tonight then resume 1 tablet daily except 1 1/2 tablets on Tuesdays and Fridays Recheck in 3 wks

## 2023-04-24 NOTE — Telephone Encounter (Signed)
Ok, noted, no changes to current plan

## 2023-04-24 NOTE — Telephone Encounter (Signed)
Pt has bronchitis and starting prednisone. He is asking about his sugar because it is spiking. He said right now it is 140 and it has been in the 200's

## 2023-04-24 NOTE — Telephone Encounter (Signed)
How much prednisone is he on and for how long?  We may just need to ride it out as it mostly likely will return to normal once the Prednisone is out of his system.  Options to adjust medications in the interim are limited.  Reinforce eating 3 well balanced meals per day, no sodas, juices, sugary beverages, no snacking (even hard candies)

## 2023-05-01 ENCOUNTER — Telehealth: Payer: Self-pay | Admitting: Cardiology

## 2023-05-01 MED ORDER — WARFARIN SODIUM 5 MG PO TABS: ORAL_TABLET | ORAL | 1 refills | Status: DC

## 2023-05-01 NOTE — Telephone Encounter (Signed)
*  STAT* If patient is at the pharmacy, call can be transferred to refill team.   1. Which medications need to be refilled? (please list name of each medication and dose if known) warfarin (COUMADIN) 5 MG tablet   2. Which pharmacy/location (including street and city if local pharmacy) is medication to be sent to?   [facility] - [place], [place] - [address]    3. Do they need a 30 day or 90 day supply? 90 day  Patient is out of medication

## 2023-05-01 NOTE — Telephone Encounter (Signed)
Refill request for warfarin:  Last INR was 1.8 on 04/24/23 Next INR due 05/15/23 LOV was 10/22/22  Refill approved.

## 2023-05-03 ENCOUNTER — Other Ambulatory Visit: Payer: Self-pay

## 2023-05-03 ENCOUNTER — Emergency Department (HOSPITAL_COMMUNITY): Payer: Medicare Other

## 2023-05-03 ENCOUNTER — Inpatient Hospital Stay (HOSPITAL_COMMUNITY)
Admission: EM | Admit: 2023-05-03 | Discharge: 2023-05-06 | DRG: 871 | Disposition: A | Discharge status: Home Health | Payer: Medicare Other | Attending: Family Medicine | Admitting: Family Medicine

## 2023-05-03 ENCOUNTER — Encounter (HOSPITAL_COMMUNITY): Payer: Self-pay

## 2023-05-03 DIAGNOSIS — I480 Paroxysmal atrial fibrillation: Secondary | ICD-10-CM | POA: Diagnosis present

## 2023-05-03 DIAGNOSIS — J189 Pneumonia, unspecified organism: Secondary | ICD-10-CM | POA: Diagnosis present

## 2023-05-03 DIAGNOSIS — Z87891 Personal history of nicotine dependence: Secondary | ICD-10-CM | POA: Diagnosis not present

## 2023-05-03 DIAGNOSIS — R000 Tachycardia, unspecified: Secondary | ICD-10-CM | POA: Diagnosis not present

## 2023-05-03 DIAGNOSIS — Z7984 Long term (current) use of oral hypoglycemic drugs: Secondary | ICD-10-CM

## 2023-05-03 DIAGNOSIS — Z8616 Personal history of COVID-19: Secondary | ICD-10-CM | POA: Diagnosis not present

## 2023-05-03 DIAGNOSIS — F419 Anxiety disorder, unspecified: Secondary | ICD-10-CM | POA: Diagnosis not present

## 2023-05-03 DIAGNOSIS — G9341 Metabolic encephalopathy: Secondary | ICD-10-CM | POA: Diagnosis present

## 2023-05-03 DIAGNOSIS — J9601 Acute respiratory failure with hypoxia: Secondary | ICD-10-CM | POA: Diagnosis present

## 2023-05-03 DIAGNOSIS — N400 Enlarged prostate without lower urinary tract symptoms: Secondary | ICD-10-CM | POA: Diagnosis present

## 2023-05-03 DIAGNOSIS — A419 Sepsis, unspecified organism: Secondary | ICD-10-CM | POA: Diagnosis present

## 2023-05-03 DIAGNOSIS — I10 Essential (primary) hypertension: Secondary | ICD-10-CM | POA: Diagnosis present

## 2023-05-03 DIAGNOSIS — Z1152 Encounter for screening for COVID-19: Secondary | ICD-10-CM | POA: Diagnosis not present

## 2023-05-03 DIAGNOSIS — R0609 Other forms of dyspnea: Secondary | ICD-10-CM

## 2023-05-03 DIAGNOSIS — Z87442 Personal history of urinary calculi: Secondary | ICD-10-CM | POA: Diagnosis not present

## 2023-05-03 DIAGNOSIS — I2510 Atherosclerotic heart disease of native coronary artery without angina pectoris: Secondary | ICD-10-CM | POA: Diagnosis present

## 2023-05-03 DIAGNOSIS — E785 Hyperlipidemia, unspecified: Secondary | ICD-10-CM | POA: Diagnosis present

## 2023-05-03 DIAGNOSIS — E1142 Type 2 diabetes mellitus with diabetic polyneuropathy: Secondary | ICD-10-CM | POA: Diagnosis present

## 2023-05-03 DIAGNOSIS — Z7901 Long term (current) use of anticoagulants: Secondary | ICD-10-CM | POA: Diagnosis not present

## 2023-05-03 DIAGNOSIS — D696 Thrombocytopenia, unspecified: Secondary | ICD-10-CM | POA: Diagnosis present

## 2023-05-03 DIAGNOSIS — N179 Acute kidney failure, unspecified: Secondary | ICD-10-CM | POA: Diagnosis not present

## 2023-05-03 DIAGNOSIS — R6520 Severe sepsis without septic shock: Secondary | ICD-10-CM | POA: Diagnosis present

## 2023-05-03 DIAGNOSIS — R531 Weakness: Secondary | ICD-10-CM | POA: Diagnosis not present

## 2023-05-03 DIAGNOSIS — Z9049 Acquired absence of other specified parts of digestive tract: Secondary | ICD-10-CM

## 2023-05-03 DIAGNOSIS — R0902 Hypoxemia: Secondary | ICD-10-CM | POA: Diagnosis not present

## 2023-05-03 DIAGNOSIS — Z79899 Other long term (current) drug therapy: Secondary | ICD-10-CM

## 2023-05-03 DIAGNOSIS — I4891 Unspecified atrial fibrillation: Secondary | ICD-10-CM | POA: Diagnosis present

## 2023-05-03 DIAGNOSIS — K2270 Barrett's esophagus without dysplasia: Secondary | ICD-10-CM | POA: Diagnosis present

## 2023-05-03 DIAGNOSIS — J9811 Atelectasis: Secondary | ICD-10-CM | POA: Diagnosis not present

## 2023-05-03 DIAGNOSIS — E876 Hypokalemia: Secondary | ICD-10-CM | POA: Diagnosis present

## 2023-05-03 DIAGNOSIS — K219 Gastro-esophageal reflux disease without esophagitis: Secondary | ICD-10-CM | POA: Diagnosis present

## 2023-05-03 DIAGNOSIS — J90 Pleural effusion, not elsewhere classified: Secondary | ICD-10-CM | POA: Diagnosis not present

## 2023-05-03 DIAGNOSIS — G2581 Restless legs syndrome: Secondary | ICD-10-CM | POA: Diagnosis present

## 2023-05-03 DIAGNOSIS — E1165 Type 2 diabetes mellitus with hyperglycemia: Secondary | ICD-10-CM | POA: Diagnosis not present

## 2023-05-03 DIAGNOSIS — Z888 Allergy status to other drugs, medicaments and biological substances status: Secondary | ICD-10-CM

## 2023-05-03 DIAGNOSIS — R0602 Shortness of breath: Secondary | ICD-10-CM | POA: Diagnosis not present

## 2023-05-03 DIAGNOSIS — J181 Lobar pneumonia, unspecified organism: Secondary | ICD-10-CM | POA: Diagnosis not present

## 2023-05-03 DIAGNOSIS — J209 Acute bronchitis, unspecified: Secondary | ICD-10-CM | POA: Diagnosis not present

## 2023-05-03 LAB — PROTIME-INR
INR: 1.8 — ABNORMAL HIGH (ref 0.8–1.2)
Prothrombin Time: 21 seconds — ABNORMAL HIGH (ref 11.4–15.2)

## 2023-05-03 LAB — COMPREHENSIVE METABOLIC PANEL
ALT: 21 U/L (ref 0–44)
AST: 23 U/L (ref 15–41)
Albumin: 3.8 g/dL (ref 3.5–5.0)
Alkaline Phosphatase: 53 U/L (ref 38–126)
Anion gap: 10 (ref 5–15)
BUN: 24 mg/dL — ABNORMAL HIGH (ref 8–23)
CO2: 25 mmol/L (ref 22–32)
Calcium: 8.8 mg/dL — ABNORMAL LOW (ref 8.9–10.3)
Chloride: 101 mmol/L (ref 98–111)
Creatinine, Ser: 1.34 mg/dL — ABNORMAL HIGH (ref 0.61–1.24)
GFR, Estimated: 53 mL/min — ABNORMAL LOW (ref >60)
Glucose, Bld: 164 mg/dL — ABNORMAL HIGH (ref 70–99)
Potassium: 4 mmol/L (ref 3.5–5.1)
Sodium: 136 mmol/L (ref 135–145)
Total Bilirubin: 1.2 mg/dL (ref 0.3–1.2)
Total Protein: 6.1 g/dL — ABNORMAL LOW (ref 6.5–8.1)

## 2023-05-03 LAB — URINALYSIS, W/ REFLEX TO CULTURE (INFECTION SUSPECTED)
Bacteria, UA: NONE SEEN
Bilirubin Urine: NEGATIVE
Glucose, UA: NEGATIVE mg/dL
Hgb urine dipstick: NEGATIVE
Ketones, ur: 5 mg/dL — AB
Leukocytes,Ua: NEGATIVE
Nitrite: NEGATIVE
Protein, ur: NEGATIVE mg/dL
Specific Gravity, Urine: 1.023 (ref 1.005–1.030)
pH: 5 (ref 5.0–8.0)

## 2023-05-03 LAB — CBC WITH DIFFERENTIAL/PLATELET
Abs Immature Granulocytes: 0.04 10*3/uL (ref 0.00–0.07)
Basophils Absolute: 0 10*3/uL (ref 0.0–0.1)
Basophils Relative: 0 %
Eosinophils Absolute: 0.1 10*3/uL (ref 0.0–0.5)
Eosinophils Relative: 1 %
HCT: 42.3 % (ref 39.0–52.0)
Hemoglobin: 14.1 g/dL (ref 13.0–17.0)
Immature Granulocytes: 1 %
Lymphocytes Relative: 10 %
Lymphs Abs: 0.8 10*3/uL (ref 0.7–4.0)
MCH: 31.4 pg (ref 26.0–34.0)
MCHC: 33.3 g/dL (ref 30.0–36.0)
MCV: 94.2 fL (ref 80.0–100.0)
Monocytes Absolute: 0.1 10*3/uL (ref 0.1–1.0)
Monocytes Relative: 2 %
Neutro Abs: 7.2 10*3/uL (ref 1.7–7.7)
Neutrophils Relative %: 86 %
Platelets: 178 10*3/uL (ref 150–400)
RBC: 4.49 MIL/uL (ref 4.22–5.81)
RDW: 14 % (ref 11.5–15.5)
WBC: 8.3 10*3/uL (ref 4.0–10.5)
nRBC: 0 % (ref 0.0–0.2)

## 2023-05-03 LAB — CULTURE, BLOOD (ROUTINE X 2)

## 2023-05-03 LAB — BLOOD GAS, VENOUS
Acid-Base Excess: 0.3 mmol/L (ref 0.0–2.0)
Bicarbonate: 24.6 mmol/L (ref 20.0–28.0)
Drawn by: 30191
O2 Saturation: 65.9 %
Patient temperature: 39.4
pCO2, Ven: 42 mmHg — ABNORMAL LOW (ref 44–60)
pH, Ven: 7.38 (ref 7.25–7.43)
pO2, Ven: 44 mmHg (ref 32–45)

## 2023-05-03 LAB — RESP PANEL BY RT-PCR (RSV, FLU A&B, COVID)  RVPGX2
Influenza A by PCR: NEGATIVE
Influenza B by PCR: NEGATIVE
Resp Syncytial Virus by PCR: NEGATIVE
SARS Coronavirus 2 by RT PCR: NEGATIVE

## 2023-05-03 LAB — CBG MONITORING, ED: Glucose-Capillary: 189 mg/dL — ABNORMAL HIGH (ref 70–99)

## 2023-05-03 LAB — GLUCOSE, CAPILLARY
Glucose-Capillary: 180 mg/dL — ABNORMAL HIGH (ref 70–99)
Glucose-Capillary: 152 mg/dL — ABNORMAL HIGH (ref 70–99)
Glucose-Capillary: 188 mg/dL — ABNORMAL HIGH (ref 70–99)

## 2023-05-03 LAB — LACTIC ACID, PLASMA
Lactic Acid, Venous: 2.7 mmol/L (ref 0.5–1.9)
Lactic Acid, Venous: 1.9 mmol/L (ref 0.5–1.9)

## 2023-05-03 MED ORDER — IPRATROPIUM-ALBUTEROL 0.5-2.5 (3) MG/3ML IN SOLN
3.0000 mL | Freq: Once | RESPIRATORY_TRACT | Status: AC
  Administered 2023-05-03: 3 mL via RESPIRATORY_TRACT
  Filled 2023-05-03: qty 3

## 2023-05-03 MED ORDER — WARFARIN SODIUM 5 MG PO TABS
6.0000 mg | ORAL_TABLET | Freq: Once | ORAL | Status: AC
  Administered 2023-05-03: 6 mg via ORAL
  Filled 2023-05-03 (×2): qty 1

## 2023-05-03 MED ORDER — SODIUM CHLORIDE 0.9 % IV SOLN
2.0000 g | INTRAVENOUS | Status: DC
  Administered 2023-05-03 – 2023-05-06 (×4): 2 g via INTRAVENOUS
  Filled 2023-05-03 (×4): qty 20

## 2023-05-03 MED ORDER — GUAIFENESIN 200 MG PO TABS: 200.0000 mg | ORAL_TABLET | ORAL | Status: DC | PRN

## 2023-05-03 MED ORDER — TAMSULOSIN HCL 0.4 MG PO CAPS
0.4000 mg | ORAL_CAPSULE | Freq: Every day | ORAL | Status: DC
  Administered 2023-05-03 – 2023-05-05 (×3): 0.4 mg via ORAL
  Filled 2023-05-03 (×3): qty 1

## 2023-05-03 MED ORDER — INSULIN ASPART 100 UNIT/ML IJ SOLN
0.0000 [IU] | Freq: Three times a day (TID) | INTRAMUSCULAR | Status: DC
  Administered 2023-05-03 (×2): 1 [IU] via SUBCUTANEOUS
  Administered 2023-05-04: 2 [IU] via SUBCUTANEOUS
  Administered 2023-05-04 – 2023-05-06 (×4): 1 [IU] via SUBCUTANEOUS

## 2023-05-03 MED ORDER — ACETAMINOPHEN 500 MG PO TABS
ORAL_TABLET | ORAL | Status: AC
  Filled 2023-05-03: qty 2

## 2023-05-03 MED ORDER — ROSUVASTATIN CALCIUM 10 MG PO TABS
10.0000 mg | ORAL_TABLET | Freq: Every day | ORAL | Status: DC
  Administered 2023-05-03 – 2023-05-06 (×4): 10 mg via ORAL
  Filled 2023-05-03 (×4): qty 1

## 2023-05-03 MED ORDER — SODIUM CHLORIDE 0.9 % IV BOLUS (SEPSIS)
1000.0000 mL | Freq: Once | INTRAVENOUS | Status: AC
  Administered 2023-05-03: 1000 mL via INTRAVENOUS

## 2023-05-03 MED ORDER — INSULIN ASPART 100 UNIT/ML IJ SOLN
0.0000 [IU] | Freq: Every day | INTRAMUSCULAR | Status: DC
  Administered 2023-05-04 – 2023-05-05 (×2): 2 [IU] via SUBCUTANEOUS

## 2023-05-03 MED ORDER — PANTOPRAZOLE SODIUM 40 MG PO TBEC
40.0000 mg | DELAYED_RELEASE_TABLET | Freq: Every day | ORAL | Status: DC
  Administered 2023-05-03 – 2023-05-06 (×4): 40 mg via ORAL
  Filled 2023-05-03 (×4): qty 1

## 2023-05-03 MED ORDER — DM-GUAIFENESIN ER 30-600 MG PO TB12
1.0000 | ORAL_TABLET | Freq: Two times a day (BID) | ORAL | Status: DC
  Administered 2023-05-03 – 2023-05-06 (×7): 1 via ORAL
  Filled 2023-05-03 (×7): qty 1

## 2023-05-03 MED ORDER — WARFARIN - PHARMACIST DOSING INPATIENT: Freq: Every day | Status: DC

## 2023-05-03 MED ORDER — ACETAMINOPHEN 500 MG PO TABS
1000.0000 mg | ORAL_TABLET | Freq: Once | ORAL | Status: AC
  Administered 2023-05-03: 1000 mg via ORAL

## 2023-05-03 MED ORDER — LACTATED RINGERS IV SOLN: INTRAVENOUS | Status: DC

## 2023-05-03 MED ORDER — IPRATROPIUM-ALBUTEROL 0.5-2.5 (3) MG/3ML IN SOLN
3.0000 mL | Freq: Four times a day (QID) | RESPIRATORY_TRACT | Status: DC
  Administered 2023-05-03 – 2023-05-06 (×11): 3 mL via RESPIRATORY_TRACT
  Filled 2023-05-03 (×12): qty 3

## 2023-05-03 MED ORDER — SODIUM CHLORIDE 0.9 % IV SOLN
500.0000 mg | INTRAVENOUS | Status: DC
  Administered 2023-05-03 – 2023-05-06 (×4): 500 mg via INTRAVENOUS
  Filled 2023-05-03 (×4): qty 5

## 2023-05-03 MED ORDER — ALBUTEROL SULFATE (2.5 MG/3ML) 0.083% IN NEBU
2.5000 mg | INHALATION_SOLUTION | RESPIRATORY_TRACT | Status: DC | PRN
  Administered 2023-05-03: 2.5 mg via RESPIRATORY_TRACT
  Filled 2023-05-03: qty 3

## 2023-05-03 MED ORDER — ESCITALOPRAM OXALATE 10 MG PO TABS
10.0000 mg | ORAL_TABLET | Freq: Every day | ORAL | Status: DC
  Administered 2023-05-03 – 2023-05-06 (×4): 10 mg via ORAL
  Filled 2023-05-03 (×4): qty 1

## 2023-05-03 NOTE — ED Triage Notes (Addendum)
Pt c/o SOB and was recently diagnosed with bronchitis. On arrival pt O2 was 86% RA and was put on NRB. Also c/o weakness.  Been taking prednisone and albuterol

## 2023-05-03 NOTE — Progress Notes (Signed)
ANTICOAGULATION CONSULT NOTE - Initial Consult  Pharmacy Consult for Warfarin Indication: atrial fibrillation  Allergies  Allergen Reactions   Prednisone     Patient state that this medication made his blood sugar go real high    Patient Measurements: Height: 5\' 9"  (175.3 cm) Weight: 96.6 kg (213 lb) IBW/kg (Calculated) : 70.7  Vital Signs: Temp: 102.8 F (39.3 C) (06/14 0704) Temp Source: Oral (06/14 0704) BP: 113/70 (06/14 0745) Pulse Rate: 103 (06/14 0745)  Labs: Recent Labs    05/03/23 0700  HGB 14.1  HCT 42.3  PLT 178  LABPROT 21.0*  INR 1.8*  CREATININE 1.34*    Estimated Creatinine Clearance: 49.6 mL/min (A) (by C-G formula based on SCr of 1.34 mg/dL (H)).  Assessment: 82 year old male hx Afib, recently treated with doxy for bronchitis now admitted with SOB/fevers/sepsis. Abx initiated and pharmacy consulted to resume warfarin therapy. INR is slightly below goal although expect patient may show coumadin sensitivity given acute illness and abx on board.  Patient is followed by the coumadin clinic. Home regimen 5mg  daily except 7.5mg  Tue and Fri.   Goal of Therapy:  INR 2-3 Monitor platelets by anticoagulation protocol: Yes   Plan:  Warfarin 6mg  po x 1 INR daily Monitor s/sx bleeding  Caryl Asp, PharmD Clinical Pharmacist 05/03/2023 8:32 AM

## 2023-05-03 NOTE — H&P (Signed)
Patient Demographics:    [name], is a 82 y.o. male  MRN: [id number]   DOB - 1941/01/01  Admit Date - 05/03/2023  Outpatient Primary MD for the patient is [name] Nevins, MD   Assessment & Plan:   Assessment and Plan:   1) sepsis secondary to right-sided pneumonia--POA-- -history of underlying interstitial lung disease -patient with lethargy/confusion consistent with acute metabolic encephalopathy -Temp 409.8, tachycardia up to 104 tachypneic up to 30 --Chest x-ray with acute multilobar right lung pneumonia -COVID, flu and RSV negative Lactic acid 2.7 improved to 1.9 with IV fluids -IV Rocephin/azithromycin pending culture data --give mucolytic's and bronchodilators-  2)AKI----acute kidney injury  --Creatinine --1.34---baseline 0.9 to 1.0,  -Hydrate IV and orally --renally adjust medications, avoid nephrotoxic agents / dehydration  / hypotension  3)PAFib--- INR ---1.8 -Currently in sinus rhythm -Continue Coumadin for stroke prophylaxis  4) acute metabolic encephalopathy--- due to #1 #2 above -As per patient brother [name] was at bedside this is Not patient's usual baseline -Anticipate improvement with treatment of #1 #2 above  5) generalized weakness/deconditioning and ambulatory dysfunction-- -family reports that patient was diagnosed with COVID respiratory infection back in November 2023 and has had on and off medical issues especially respiratory issues since then -Has been treated recently with antibiotics and steroids -Patient apparently has been confused and disoriented on and off for the last 24 hours or so -Hold off on PT eval until medically improves  6)DM2-  A1c 7.9 reflecting uncontrolled DM with hyperglycemia PTA -Hold metformin , Actos and glipizide Use Novolog/Humalog Sliding  scale insulin with Accu-Cheks/Fingersticks as ordered   7)BPH--continue Flomax  8) anxiety disorder--hold Xanax especially with lethargy and encephalopathy -Restart Lexapro  9)HLD--c/n Crestor  10)GERD--- continue Protonix  Status is: Inpatient  Remains inpatient appropriate because:   Dispo: The patient is from: Home              Anticipated d/c is to: Home              Anticipated d/c date is: 2 days              Patient currently is not medically stable to d/c. Barriers: Not Clinically Stable-   With History of - Reviewed by me  Past Medical History:  Diagnosis Date   Aortic atherosclerosis (HCC)    Barrett's esophagus    Short-segment, diagnosed in 2007 by Dr. Jena [name]   BPH (benign prostatic hyperplasia)    CAD (coronary artery disease)    a. cath on 07/21/2019 showing patent LM and LCx with 40-60% Proximal LAD and 30-40% Pro-RCA stenosis with normal LV function.   Essential hypertension    GERD (gastroesophageal reflux disease)    Hiatal hernia    History of kidney stones    PAF (paroxysmal atrial fibrillation) (HCC)    S/P colonoscopy 2007   S/P endoscopy 08/2010    Salmon-colored epithelium coming up to 37 cm from the  Type 2 diabetes mellitus (HCC)    Vertigo       Past Surgical History:  Procedure Laterality Date   APPENDECTOMY     BIOPSY  01/28/2020   Procedure: BIOPSY;  Surgeon: [name] Ade, MD;  Location: [facility];  Service: Endoscopy;;  esophagus   CHOLECYSTECTOMY     COLONOSCOPY  02/06/2006   Dr. Jena [name]- single anal papilla, o/w normal rectum, normal colon   COLONOSCOPY N/A 09/13/2016   RMR: three 5-6 mm for adenomas removed.  Recommend one last surveillance colonoscopy in October 2020.   COLONOSCOPY WITH PROPOFOL N/A 01/28/2020   Procedure: COLONOSCOPY WITH PROPOFOL;  Surgeon: [name] Ade, MD;  Location: [facility];  Service: Endoscopy;  Laterality: N/A;  10:45am   CORONARY PRESSURE/FFR STUDY N/A 07/21/2019   Procedure: INTRAVASCULAR  PRESSURE WIRE/FFR STUDY;  Surgeon: [name] Records, MD;  Location: [facility];  Service: Cardiovascular;  Laterality: N/A;   CYSTOSCOPY WITH RETROGRADE PYELOGRAM, URETEROSCOPY AND STENT PLACEMENT Right 08/18/2020   Procedure: CYSTOSCOPY WITH INCISION OF RIGHT URETEROCELE, URETEROSCOPY WITH HOLMIUM LASER, CYSTOLITHOLAPAXY OF 10 MM BLADDER STONE;  Surgeon: [name] Pippin, MD;  Location: [facility];  Service: Urology;  Laterality: Right;   ESOPHAGOGASTRODUODENOSCOPY  09/15/2010   Dr. Jena [name]- Salmon-colored epithelium coming up to 37 cm from the    ESOPHAGOGASTRODUODENOSCOPY N/A 07/02/2013   Dr. Jena [name]- Barretts on bx. hiatal hernia   ESOPHAGOGASTRODUODENOSCOPY N/A 09/13/2016   RMR: Barrett's esophagus without dysplasia. one last EGD in 3 years.    ESOPHAGOGASTRODUODENOSCOPY (EGD) WITH PROPOFOL N/A 01/28/2020   Procedure: ESOPHAGOGASTRODUODENOSCOPY (EGD) WITH PROPOFOL;  Surgeon: [name] Ade, MD;  Location: [facility];  Service: Endoscopy;  Laterality: N/A;   LEFT HEART CATH AND CORONARY ANGIOGRAPHY N/A 07/21/2019   Procedure: LEFT HEART CATH AND CORONARY ANGIOGRAPHY;  Surgeon: [name] Records, MD;  Location: [facility];  Service: Cardiovascular;  Laterality: N/A;   POLYPECTOMY  01/28/2020   Procedure: POLYPECTOMY;  Surgeon: [name] Ade, MD;  Location: [facility];  Service: Endoscopy;;  colon   ROTATOR CUFF REPAIR     YAG LASER APPLICATION Right 12/20/2014   Procedure: YAG LASER APPLICATION;  Surgeon: [name] Simmonds, MD;  Location: [facility];  Service: Ophthalmology;  Laterality: Right;    Chief Complaint  Patient presents with   Shortness of Breath      HPI:    [name]  is a 82 y.o. male with past medical history relevant for PAFib on Coumadin, restless leg syndrome, interstitial lung disease, DM2, HTN, BPH and GERD who presents to the ED with complaints of sleepiness -Most of the history is being obtained from patient's brother [name] at bedside as patient is sleepy -Patient  wakes up easily to answer questions and then falls back asleep --Temp 102.8, tachycardia up to 104 tachypneic up to 30 - Apparently patient was diagnosed with COVID respiratory infection back in November 2023 and has had on and off medical issues especially respiratory issues since then -Has been treated recently with antibiotics and steroids -Patient apparently has been confused and disoriented on and off for the last 24 hours or so  No Nausea, Vomiting or Diarrhea -Chest x-ray with acute multilobar right lung pneumonia -COVID, flu and RSV negative -CBC WNL Lactic acid 2.7 improved to 1.9 with IV fluids -UA not suggestive of UTI VBG mostly reassuring-- -Creatinine --1.34---baseline 0.9 to 1.0, LFTs are not elevated INR ---1.8   Review of systems:    In addition  to the HPI above,   A full Review of  Systems was done, all other systems reviewed are negative except as noted above in HPI , .    Social History:  Reviewed by me    Social History   Tobacco Use   Smoking status: Former    Packs/day: 1.50    Years: 30.00    Additional pack years: 0.00    Total pack years: 45.00    Types: Cigarettes    Quit date: 11/18/1993    Years since quitting: 29.4   Smokeless tobacco: Never   Tobacco comments:    quit in the 90's  Substance Use Topics   Alcohol use: No       Family History :  Reviewed by me    Family History  Problem Relation Age of Onset   Prostate cancer Father        deceased   Stroke Mother        deceased   Colon cancer Neg Hx     Home Medications:   Prior to Admission medications   Medication Sig Start Date End Date Taking? Authorizing Provider  allopurinol (ZYLOPRIM) 100 MG tablet Take 100 mg by mouth daily.  02/12/19  Yes [provider]  ALPRAZolam Prudy Feeler) 1 MG tablet Take 0.5 mg by mouth at bedtime as needed for anxiety or sleep.   Yes [provider]  doxycycline (VIBRAMYCIN) 100 MG capsule Take 1 capsule (100 mg total) by  mouth 2 (two) times daily. 03/04/23  Yes Terrilee Files, MD  DULoxetine (CYMBALTA) 60 MG capsule Take 60 mg by mouth daily. 10/20/20  Yes [provider]  finasteride (PROSCAR) 5 MG tablet TAKE ONE TABLET (5MG  TOTAL) BY MOUTH DAILY Patient taking differently: Take 5 mg by mouth daily. 03/21/23  Yes [name] Pippin, MD  gabapentin (NEURONTIN) 300 MG capsule Take 300 mg by mouth 2 (two) times daily.    Yes [provider]  glipiZIDE (GLUCOTROL XL) 5 MG 24 hr tablet TAKE ONE TABLET (5MG  TOTAL) BY MOUTH DAILY WITH BREAKFAST Patient taking differently: Take 5 mg by mouth daily with breakfast. 03/14/23  Yes Dani Gobble, NP  metFORMIN (GLUCOPHAGE-XR) 500 MG 24 hr tablet Take 1 tablet (500 mg total) by mouth 2 (two) times daily with a meal. 02/12/23  Yes Reardon, Alphonzo Lemmings J, NP  methylPREDNISolone (MEDROL DOSEPAK) 4 MG TBPK tablet Take 4 mg by mouth as directed. 04/22/23  Yes [provider]  pioglitazone (ACTOS) 30 MG tablet TAKE ONE TABLET (30MG  TOTAL) BY MOUTH DAILY Patient taking differently: Take 30 mg by mouth daily. 04/24/23  Yes Reardon, Freddi Starr, NP  rosuvastatin (CRESTOR) 10 MG tablet TAKE ONE TABLET (10MG  TOTAL) BY MOUTH DAILY Patient taking differently: Take 10 mg by mouth daily. 12/07/22  Yes Jonelle Sidle, MD  Semaglutide (RYBELSUS) 3 MG TABS Take 1 tablet (3 mg total) by mouth daily. 03/05/23  Yes Reardon, Freddi Starr, NP  tamsulosin (FLOMAX) 0.4 MG CAPS capsule TAKE ONE CAPSULE (0.4MG  TOTAL) BY MOUTH TWO TIMES DAILY Patient taking differently: Take 0.4 mg by mouth daily. 03/04/23  Yes [name] Pippin, MD  traMADol (ULTRAM) 50 MG tablet Take 1 tablet (50 mg total) by mouth every 6 (six) hours as needed for moderate pain (pain.). 08/18/20  Yes [name] Pippin, MD  warfarin (COUMADIN) 5 MG tablet TAKE ONE TABLET BY MOUTH DAILY EXCEPT TAKE ONE AND ONE-HALF TABLETS ON TUESDAYS AND FRIDAYS OR AS DIRECTED Patient taking differently: Take 5 mg by mouth  See admin instructions. TAKE ONE  TABLET BY MOUTH DAILY EXCEPT TAKE ONE AND ONE-HALF TABLETS ON TUESDAYS AND FRIDAYS OR AS DIRECTED 05/01/23  Yes Jonelle Sidle, MD  Accu-Chek FastClix Lancets MISC 1 application by Does not apply route 2 (two) times daily. 08/23/20   Dani Gobble, NP  Cholecalciferol (VITAMIN D3) 50 MCG (2000 UT) TABS Take 2,000 Units by mouth daily.    [provider]  escitalopram (LEXAPRO) 10 MG tablet Take 10 mg by mouth daily. Patient not taking: Reported on 05/03/2023 01/02/22   [provider]  fexofenadine (ALLEGRA) 180 MG tablet Take 180 mg by mouth daily.    [provider]  glucose blood test strip 1 each by Other route daily before breakfast. Use as instructed 09/26/20   Dani Gobble, NP  Hydrocod Poli-Chlorphe Poli ER 10-8 MG CP12 Take 5 mLs by mouth at bedtime as needed. Patient not taking: Reported on 05/03/2023 03/04/23   Terrilee Files, MD  pantoprazole (PROTONIX) 40 MG tablet TAKE ONE TABLET (40MG  TOTAL) BY MOUTH TWO TIMES DAILY BEFORE A MEAL Patient taking differently: Take 40 mg by mouth 2 (two) times daily. 03/27/23   Rourk, Gerrit Friends, MD  sildenafil (VIAGRA) 50 MG tablet Take 50 mg by mouth daily. Patient not taking: Reported on 05/03/2023 12/21/21   [provider]  tadalafil (CIALIS) 20 MG tablet Take 1 tablet (20 mg total) by mouth daily as needed for erectile dysfunction. Patient not taking: Reported on 05/03/2023 06/07/22   [name] Pippin, MD     Allergies:     Allergies  Allergen Reactions   Prednisone     Patient state that this medication made his blood sugar go real high    Physical Exam:   Vitals  Blood pressure (!) 110/50, pulse 82, temperature 99 F (37.2 C), temperature source Oral, resp. rate 20, height 6\' 2"  (1.88 m), weight 100.9 kg, SpO2 96 %.  Physical Examination: General appearance -sleepy, easily arousable , in no acute distress mental status -sleepy, wakes up to answer questions, oriented to person, place, and time,   Nose-  4L/min Eyes - sclera anicteric Neck - supple, no JVD elevation , Chest -diminished breath sounds, few scattered rhonchi Heart - S1 and S2 normal, regular  Abdomen - soft, nontender, nondistended, +BS Neurological -episodes of confusion and disorientation, generalized weakness, neck supple without rigidity, cranial nerves II through XII intact, DTR's normal and symmetric Extremities - no pedal edema noted, intact peripheral pulses  Skin - warm, dry   Data Review:    CBC Recent Labs  Lab 05/03/23 0700  WBC 8.3  HGB 14.1  HCT 42.3  PLT 178  MCV 94.2  MCH 31.4  MCHC 33.3  RDW 14.0  LYMPHSABS 0.8  MONOABS 0.1  EOSABS 0.1  BASOSABS 0.0   ------------------------------------------------------------------------------------------------------------------  Chemistries  Recent Labs  Lab 05/03/23 0700  NA 136  K 4.0  CL 101  CO2 25  GLUCOSE 164*  BUN 24*  CREATININE 1.34*  CALCIUM 8.8*  AST 23  ALT 21  ALKPHOS 53  BILITOT 1.2   ------------------------------------------------------------------------------------------------------------------ estimated creatinine clearance is 54.9 mL/min (A) (by C-G formula based on SCr of 1.34 mg/dL (H)). ------------------------------------------------------------------------------------------------------------------  Coagulation profile Recent Labs  Lab 05/03/23 0700  INR 1.8*   ------------------------------------------------------------------------------------------------------------------------------------------------------------------------------------------------------------------------------------    Component Value Date/Time   BNP 83.0 03/04/2023 1634   Urinalysis    Component Value Date/Time   COLORURINE YELLOW 05/03/2023 0759   APPEARANCEUR CLEAR 05/03/2023 0759  APPEARANCEUR Clear 12/06/2022 1208   LABSPEC 1.023 05/03/2023 0759   PHURINE 5.0 05/03/2023 0759   GLUCOSEU NEGATIVE 05/03/2023 0759   HGBUR  NEGATIVE 05/03/2023 0759   BILIRUBINUR NEGATIVE 05/03/2023 0759   BILIRUBINUR Negative 12/06/2022 1208   KETONESUR 5 (A) 05/03/2023 0759   PROTEINUR NEGATIVE 05/03/2023 0759   UROBILINOGEN negative (A) 02/09/2020 1338   UROBILINOGEN 0.2 05/14/2014 1505   NITRITE NEGATIVE 05/03/2023 0759   LEUKOCYTESUR NEGATIVE 05/03/2023 0759     Imaging Results:    DG Chest 2 View  Result Date: 05/03/2023 CLINICAL DATA:  82 year old male with suspected sepsis. EXAM: CHEST - 2 VIEW COMPARISON:  Chest CT 03/04/2023 and earlier. FINDINGS: AP and lateral views of the chest at 0717 hours. Multifocal confluent but vague opacity in the right lung is new, basilar predominant and on the lateral view middle and lower lobe involvement is suspected. Stable lung volumes. Stable cardiac size and mediastinal contours. Visualized tracheal air column is within normal limits. No pneumothorax. No pleural effusion. No acute osseous abnormality identified. Paucity of bowel gas. Stable cholecystectomy clips. IMPRESSION: Evidence of Acute Multilobar Right Lung Pneumonia. No pleural effusion. Electronically Signed   By: Odessa Fleming M.D.   On: 05/03/2023 07:35    Radiological Exams on Admission: DG Chest 2 View  Result Date: 05/03/2023 CLINICAL DATA:  82 year old male with suspected sepsis. EXAM: CHEST - 2 VIEW COMPARISON:  Chest CT 03/04/2023 and earlier. FINDINGS: AP and lateral views of the chest at 0717 hours. Multifocal confluent but vague opacity in the right lung is new, basilar predominant and on the lateral view middle and lower lobe involvement is suspected. Stable lung volumes. Stable cardiac size and mediastinal contours. Visualized tracheal air column is within normal limits. No pneumothorax. No pleural effusion. No acute osseous abnormality identified. Paucity of bowel gas. Stable cholecystectomy clips. IMPRESSION: Evidence of Acute Multilobar Right Lung Pneumonia. No pleural effusion. Electronically Signed   By: Odessa Fleming M.D.    On: 05/03/2023 07:35    DVT Prophylaxis -SCD/Heparin AM Labs Ordered, also please review Full Orders  Family Communication: Admission, patients condition and plan of care including tests being ordered have been discussed with the patient and his brother [name] who indicate understanding and agree with the plan   Condition  -stable  Shon Hale M.D on 05/03/2023 at 5:25 PM Go to [email or web address] -  for contact info  [facility] - Office  [phone]

## 2023-05-03 NOTE — ED Notes (Signed)
ED TO INPATIENT HANDOFF REPORT  ED Nurse Name and Phone #: [phone]  S Name/Age/Gender [name] 82 y.o. male Room/Bed: APA18/APA18  Code Status   Code Status: Prior  Home/SNF/Other Home Patient oriented to: self, place, time, and situation Is this baseline? Yes   Triage Complete: Triage complete  Chief Complaint Pneumonia [J18.9]  Triage Note Pt c/o SOB and was recently diagnosed with bronchitis. On arrival pt O2 was 86% RA and was put on NRB. Also c/o weakness.  Been taking prednisone and albuterol     Allergies Allergies  Allergen Reactions   Prednisone     Patient state that this medication made his blood sugar go real high    Level of Care/Admitting Diagnosis ED Disposition     ED Disposition  Admit   Condition  --   Comment  Hospital Area: [facility] [[id number]]  Level of Care: Telemetry [5]  Covid Evaluation: Asymptomatic - no recent exposure (last 10 days) testing not required  Diagnosis: Pneumonia [227785]  Admitting Physician: Marylyn Ishihara  Attending Physician: Marylyn Ishihara  Certification:: I certify this patient will need inpatient services for at least 2 midnights  Estimated Length of Stay: 2          B Medical/Surgery History Past Medical History:  Diagnosis Date   Aortic atherosclerosis (HCC)    Barrett's esophagus    Short-segment, diagnosed in 2007 by Dr. Jena Gauss   BPH (benign prostatic hyperplasia)    CAD (coronary artery disease)    a. cath on 07/21/2019 showing patent LM and LCx with 40-60% Proximal LAD and 30-40% Pro-RCA stenosis with normal LV function.   Essential hypertension    GERD (gastroesophageal reflux disease)    Hiatal hernia    History of kidney stones    PAF (paroxysmal atrial fibrillation) (HCC)    S/P colonoscopy 2007   S/P endoscopy 08/2010    Salmon-colored epithelium coming up to 37 cm from the    Type 2 diabetes mellitus (HCC)    Vertigo    Past Surgical History:   Procedure Laterality Date   APPENDECTOMY     BIOPSY  01/28/2020   Procedure: BIOPSY;  Surgeon: Corbin Ade, MD;  [facility];  Service: Endoscopy;;  esophagus   CHOLECYSTECTOMY     COLONOSCOPY  02/06/2006   Dr. Jena Gauss- single anal papilla, o/w normal rectum, normal colon   COLONOSCOPY N/A 09/13/2016   RMR: three 5-6 mm for adenomas removed.  Recommend one last surveillance colonoscopy in October 2020.   COLONOSCOPY WITH PROPOFOL N/A 01/28/2020   Procedure: COLONOSCOPY WITH PROPOFOL;  Surgeon: Corbin Ade, MD;  [facility];  Service: Endoscopy;  Laterality: N/A;  10:45am   CORONARY PRESSURE/FFR STUDY N/A 07/21/2019   Procedure: INTRAVASCULAR PRESSURE WIRE/FFR STUDY;  Surgeon: Lyn Records, MD;  [facility]: [facility];  Service: Cardiovascular;  Laterality: N/A;   CYSTOSCOPY WITH RETROGRADE PYELOGRAM, URETEROSCOPY AND STENT PLACEMENT Right 08/18/2020   Procedure: CYSTOSCOPY WITH INCISION OF RIGHT URETEROCELE, URETEROSCOPY WITH HOLMIUM LASER, CYSTOLITHOLAPAXY OF 10 MM BLADDER STONE;  Surgeon: Bjorn Pippin, MD;  Location: [facility];  Service: Urology;  Laterality: Right;   ESOPHAGOGASTRODUODENOSCOPY  09/15/2010   Dr. Jena Gauss- Salmon-colored epithelium coming up to 37 cm from the    ESOPHAGOGASTRODUODENOSCOPY N/A 07/02/2013   Dr. Jena Gauss- Barretts on bx. hiatal hernia   ESOPHAGOGASTRODUODENOSCOPY N/A 09/13/2016   RMR: Barrett's esophagus without dysplasia. one last EGD in 3 years.    ESOPHAGOGASTRODUODENOSCOPY (  EGD) WITH PROPOFOL N/A 01/28/2020   Procedure: ESOPHAGOGASTRODUODENOSCOPY (EGD) WITH PROPOFOL;  Surgeon: Corbin Ade, MD;  [facility];  Service: Endoscopy;  Laterality: N/A;   LEFT HEART CATH AND CORONARY ANGIOGRAPHY N/A 07/21/2019   Procedure: LEFT HEART CATH AND CORONARY ANGIOGRAPHY;  Surgeon: Lyn Records, MD;  [facility];  Service: Cardiovascular;  Laterality: N/A;   POLYPECTOMY  01/28/2020   Procedure: POLYPECTOMY;  Surgeon:  Corbin Ade, MD;  [facility];  Service: Endoscopy;;  colon   ROTATOR CUFF REPAIR     YAG LASER APPLICATION Right 12/20/2014   Procedure: YAG LASER APPLICATION;  Surgeon: Susa Simmonds, MD;  Location: [facility];  Service: Ophthalmology;  Laterality: Right;     A IV Location/Drains/Wounds Patient Lines/Drains/Airways Status     Active Line/Drains/Airways     Name Placement date Placement time Site Days   Peripheral IV 05/03/23 18 G Right Antecubital 05/03/23  0704  Antecubital  less than 1   Peripheral IV 05/03/23 18 G Right;Posterior Hand 05/03/23  0710  Hand  less than 1            Intake/Output Last 24 hours  Intake/Output Summary (Last 24 hours) at 05/03/2023 0823 Last data filed at 05/03/2023 0816 Gross per 24 hour  Intake 100 ml  Output --  Net 100 ml    Labs/Imaging Results for orders placed or performed during the hospital encounter of 05/03/23 (from the past 48 hour(s))  Comprehensive metabolic panel     Status: Abnormal   Collection Time: 05/03/23  7:00 AM  Result Value Ref Range   Sodium 136 135 - 145 mmol/L   Potassium 4.0 3.5 - 5.1 mmol/L   Chloride 101 98 - 111 mmol/L   CO2 25 22 - 32 mmol/L   Glucose, Bld 164 (H) 70 - 99 mg/dL    Comment: Glucose reference range applies only to samples taken after fasting for at least 8 hours.   BUN 24 (H) 8 - 23 mg/dL   Creatinine, Ser 8.18 (H) 0.61 - 1.24 mg/dL   Calcium 8.8 (L) 8.9 - 10.3 mg/dL   Total Protein 6.1 (L) 6.5 - 8.1 g/dL   Albumin 3.8 3.5 - 5.0 g/dL   AST 23 15 - 41 U/L   ALT 21 0 - 44 U/L   Alkaline Phosphatase 53 38 - 126 U/L   Total Bilirubin 1.2 0.3 - 1.2 mg/dL   GFR, Estimated 53 (L) >60 mL/min    Comment: (NOTE) Calculated using the CKD-EPI Creatinine Equation (2021)    Anion gap 10 5 - 15    Comment: Performed at [facility], [address], [place], [place] [address]  Lactic acid, plasma     Status: Abnormal   Collection Time: 05/03/23  7:00 AM  Result Value Ref Range    Lactic Acid, Venous 2.7 (HH) 0.5 - 1.9 mmol/L    Comment: CRITICAL RESULT CALLED TO, READ BACK BY AND VERIFIED WITH WHITE,M AT 7:35AM ON 05/03/23 BY [facility] Performed at [facility], [address], [place], [place] [address]   CBC with Differential     Status: None   Collection Time: 05/03/23  7:00 AM  Result Value Ref Range   WBC 8.3 4.0 - 10.5 K/uL   RBC 4.49 4.22 - 5.81 MIL/uL   Hemoglobin 14.1 13.0 - 17.0 g/dL   HCT 89.3 81.0 - 17.5 %   MCV 94.2 80.0 - 100.0 fL   MCH 31.4  26.0 - 34.0 pg   MCHC 33.3 30.0 - 36.0 g/dL   RDW 16.1 09.6 - 04.5 %   Platelets 178 150 - 400 K/uL   nRBC 0.0 0.0 - 0.2 %   Neutrophils Relative % 86 %   Neutro Abs 7.2 1.7 - 7.7 K/uL   Lymphocytes Relative 10 %   Lymphs Abs 0.8 0.7 - 4.0 K/uL   Monocytes Relative 2 %   Monocytes Absolute 0.1 0.1 - 1.0 K/uL   Eosinophils Relative 1 %   Eosinophils Absolute 0.1 0.0 - 0.5 K/uL   Basophils Relative 0 %   Basophils Absolute 0.0 0.0 - 0.1 K/uL   Immature Granulocytes 1 %   Abs Immature Granulocytes 0.04 0.00 - 0.07 K/uL    Comment: Performed at [facility], [address], [place], [place] [address]  Protime-INR     Status: Abnormal   Collection Time: 05/03/23  7:00 AM  Result Value Ref Range   Prothrombin Time 21.0 (H) 11.4 - 15.2 seconds   INR 1.8 (H) 0.8 - 1.2    Comment: (NOTE) INR goal varies based on device and disease states. Performed at [facility], [address] [place], [place] [address]   Culture, blood (Routine x 2)     Status: None (Preliminary result)   Collection Time: 05/03/23  7:00 AM   Specimen: Right Antecubital; Blood  Result Value Ref Range   Specimen Description      RIGHT ANTECUBITAL BOTTLES DRAWN AEROBIC AND ANAEROBIC   Special Requests      Blood Culture adequate volume Performed at [facility], [address], [place], [place] [address]    Culture PENDING    Report Status PENDING   Culture, blood (Routine x 2)     Status: None (Preliminary result)   Collection  Time: 05/03/23  7:00 AM   Specimen: BLOOD RIGHT HAND  Result Value Ref Range   Specimen Description      BLOOD RIGHT HAND BOTTLES DRAWN AEROBIC AND ANAEROBIC   Special Requests      Blood Culture adequate volume Performed at [facility], [address] [place], [place] [address]    Culture PENDING    Report Status PENDING   Resp panel by RT-PCR (RSV, Flu A&B, Covid) Anterior Nasal Swab     Status: None   Collection Time: 05/03/23  7:27 AM   Specimen: Anterior Nasal Swab  Result Value Ref Range   SARS Coronavirus 2 by RT PCR NEGATIVE NEGATIVE    Comment: (NOTE) SARS-CoV-2 target nucleic acids are NOT DETECTED.  The SARS-CoV-2 RNA is generally detectable in upper respiratory specimens during the acute phase of infection. The lowest concentration of SARS-CoV-2 viral copies this assay can detect is 138 copies/mL. A negative result does not preclude SARS-Cov-2 infection and should not be used as the sole basis for treatment or other patient management decisions. A negative result may occur with  improper specimen collection/handling, submission of specimen other than nasopharyngeal swab, presence of viral mutation(s) within the areas targeted by this assay, and inadequate number of viral copies(<138 copies/mL). A negative result must be combined with clinical observations, patient history, and epidemiological information. The expected result is Negative.  Fact Sheet for Patients:  BloggerCourse.com  Fact Sheet for Healthcare Providers:  SeriousBroker.it  This test is no t yet approved or cleared by the [place] FDA and  has been authorized for detection and/or diagnosis of SARS-CoV-2 by FDA under an Emergency Use Authorization (EUA). This EUA will  remain  in effect (meaning this test can be used) for the duration of the COVID-19 declaration under Section 564(b)(1) of the Act, 21 U.S.C.section 360bbb-3(b)(1), unless the  authorization is terminated  or revoked sooner.       Influenza A by PCR NEGATIVE NEGATIVE   Influenza B by PCR NEGATIVE NEGATIVE    Comment: (NOTE) The Xpert Xpress SARS-CoV-2/FLU/RSV plus assay is intended as an aid in the diagnosis of influenza from Nasopharyngeal swab specimens and should not be used as a sole basis for treatment. Nasal washings and aspirates are unacceptable for Xpert Xpress SARS-CoV-2/FLU/RSV testing.  Fact Sheet for Patients: BloggerCourse.com  Fact Sheet for Healthcare Providers: SeriousBroker.it  This test is not yet approved or cleared by the [place] FDA and has been authorized for detection and/or diagnosis of SARS-CoV-2 by FDA under an Emergency Use Authorization (EUA). This EUA will remain in effect (meaning this test can be used) for the duration of the COVID-19 declaration under Section 564(b)(1) of the Act, 21 U.S.C. section 360bbb-3(b)(1), unless the authorization is terminated or revoked.     Resp Syncytial Virus by PCR NEGATIVE NEGATIVE    Comment: (NOTE) Fact Sheet for Patients: BloggerCourse.com  Fact Sheet for Healthcare Providers: SeriousBroker.it  This test is not yet approved or cleared by the [place] FDA and has been authorized for detection and/or diagnosis of SARS-CoV-2 by FDA under an Emergency Use Authorization (EUA). This EUA will remain in effect (meaning this test can be used) for the duration of the COVID-19 declaration under Section 564(b)(1) of the Act, 21 U.S.C. section 360bbb-3(b)(1), unless the authorization is terminated or revoked.  Performed at [facility], [address], [place], [place] [address]   CBG monitoring, ED     Status: Abnormal   Collection Time: 05/03/23  7:33 AM  Result Value Ref Range   Glucose-Capillary 189 (H) 70 - 99 mg/dL    Comment: Glucose reference range applies only to samples  taken after fasting for at least 8 hours.  Urinalysis, w/ Reflex to Culture (Infection Suspected) -Urine, Clean Catch     Status: Abnormal   Collection Time: 05/03/23  7:59 AM  Result Value Ref Range   Specimen Source URINE, CATHETERIZED    Color, Urine YELLOW YELLOW   APPearance CLEAR CLEAR   Specific Gravity, Urine 1.023 1.005 - 1.030   pH 5.0 5.0 - 8.0   Glucose, UA NEGATIVE NEGATIVE mg/dL   Hgb urine dipstick NEGATIVE NEGATIVE   Bilirubin Urine NEGATIVE NEGATIVE   Ketones, ur 5 (A) NEGATIVE mg/dL   Protein, ur NEGATIVE NEGATIVE mg/dL   Nitrite NEGATIVE NEGATIVE   Leukocytes,Ua NEGATIVE NEGATIVE   RBC / HPF 0-5 0 - 5 RBC/hpf   WBC, UA 0-5 0 - 5 WBC/hpf    Comment:        Reflex urine culture not performed if WBC <=10, OR if Squamous epithelial cells >5. If Squamous epithelial cells >5 suggest recollection.    Bacteria, UA NONE SEEN NONE SEEN   Squamous Epithelial / HPF 0-5 0 - 5 /HPF   Mucus PRESENT     Comment: Performed at [facility], [address] [place], [place] [address]   DG Chest 2 View  Result Date: 05/03/2023 CLINICAL DATA:  82 year old male with suspected sepsis. EXAM: CHEST - 2 VIEW COMPARISON:  Chest CT 03/04/2023 and earlier. FINDINGS: AP and lateral views of the chest at 0717 hours. Multifocal confluent but vague opacity in the right lung is new, basilar predominant and  on the lateral view middle and lower lobe involvement is suspected. Stable lung volumes. Stable cardiac size and mediastinal contours. Visualized tracheal air column is within normal limits. No pneumothorax. No pleural effusion. No acute osseous abnormality identified. Paucity of bowel gas. Stable cholecystectomy clips. IMPRESSION: Evidence of Acute Multilobar Right Lung Pneumonia. No pleural effusion. Electronically Signed   By: Odessa Fleming M.D.   On: 05/03/2023 07:35    Pending Labs Unresulted Labs (From admission, onward)     Start     Ordered   05/03/23 0806  Blood gas, venous (at [facility] and [facility])   Once,   R        05/03/23 0805   05/03/23 0706  Lactic acid, plasma  Now then every 2 hours,   R (with STAT occurrences)      05/03/23 0705            Vitals/Pain Today's Vitals   05/03/23 0704 05/03/23 0715 05/03/23 0730 05/03/23 0745  BP:   97/81 113/70  Pulse:  85 98 (!) 103  Resp:  (!) 25 (!) 23 (!) 30  Temp:      TempSrc:      SpO2:  90% 94% 95%  Weight: 213 lb (96.6 kg)     Height: 5\' 9"  (1.753 m)     PainSc:        Isolation Precautions No active isolations  Medications Medications  lactated ringers infusion ( Intravenous New Bag/Given 05/03/23 0730)  sodium chloride 0.9 % bolus 1,000 mL (1,000 mLs Intravenous New Bag/Given 05/03/23 0759)  cefTRIAXone (ROCEPHIN) 2 g in sodium chloride 0.9 % 100 mL IVPB (0 g Intravenous Stopped 05/03/23 0816)  azithromycin (ZITHROMAX) 500 mg in sodium chloride 0.9 % 250 mL IVPB (500 mg Intravenous New Bag/Given 05/03/23 0816)  albuterol (PROVENTIL) (2.5 MG/3ML) 0.083% nebulizer solution 2.5 mg (2.5 mg Nebulization Given 05/03/23 0816)  ipratropium-albuterol (DUONEB) 0.5-2.5 (3) MG/3ML nebulizer solution 3 mL (has no administration in time range)  dextromethorphan-guaiFENesin (MUCINEX DM) 30-600 MG per 12 hr tablet 1 tablet (has no administration in time range)  insulin aspart (novoLOG) injection 0-6 Units (has no administration in time range)  insulin aspart (novoLOG) injection 0-5 Units (has no administration in time range)  acetaminophen (TYLENOL) tablet 1,000 mg (1,000 mg Oral Given 05/03/23 0712)  ipratropium-albuterol (DUONEB) 0.5-2.5 (3) MG/3ML nebulizer solution 3 mL (3 mLs Nebulization Given 05/03/23 0817)    Mobility walks     Focused Assessments Pulmonary Assessment Handoff:  Lung sounds: Bilateral Breath Sounds: Rhonchi O2 Device: Nasal Cannula O2 Flow Rate (L/min): 4 L/min    R Recommendations: See Admitting Provider Note  Report given to:   Additional Notes: [id number]

## 2023-05-03 NOTE — Progress Notes (Signed)
Elink following for sepsis protocol. 

## 2023-05-03 NOTE — ED Notes (Signed)
Increased [place] to 4L due to desat

## 2023-05-03 NOTE — TOC CM/SW Note (Signed)
Transition of Care [facility]) - Inpatient Brief Assessment   Patient Details  Name: [name] MRN: [id number] Date of Birth: Apr 16, 1941  Transition of Care [facility]) CM/SW Contact:    Elliot Gault, LCSW Phone Number: 05/03/2023, 11:47 AM   Clinical Narrative:  Transition of Care Department [facility]) has reviewed patient and no TOC needs have been identified at this time. We will continue to monitor patient advancement through interdisciplinary progression rounds. If new patient transition needs arise, please place a TOC consult.  Transition of Care Asessment: Insurance and Status: Insurance coverage has been reviewed Patient has primary care physician: Yes Home environment has been reviewed: from home with family Prior level of function:: independent Prior/Current Home Services: No current home services Social Determinants of Health Reivew: SDOH reviewed no interventions necessary Readmission risk has been reviewed: Yes Transition of care needs: no transition of care needs at this time

## 2023-05-03 NOTE — ED Provider Notes (Signed)
[place] EMERGENCY DEPARTMENT AT [facility] Provider Note   CSN: [id number] Arrival date & time: 05/03/23  4098     History  Chief Complaint  Patient presents with   Shortness of Breath    [name] is a 82 y.o. male.  HPI    82 year old male comes into the emergency room with chief complaint of shortness of breath, confusion.  Patient has history of A-fib on Coumadin, restless leg syndrome, interstitial lung disease, diabetes.  Patient accompanied by his girlfriend, who provides substantial part of the history.  According to the girlfriend, she saw him yesterday and he was coughing, but otherwise doing well.  According to her, this morning patient reached out to his father-in-law, who went to assess him and immediately requested calling 911.  Patient was confused, and is still confused according to the girlfriend.  Patient indicates that he has been feeling unwell for a while.  He did not however realize that he had a fever.  His cough is producing phlegm, sometimes with blood in it.  The girlfriend indicates that patient has not been feeling well ever since he had COVID around Thanksgiving.  He has been up and down with respiratory illness, and currently patient is on antibiotics and steroids.   Home Medications Prior to Admission medications   Medication Sig Start Date End Date Taking? Authorizing Provider  Accu-Chek FastClix Lancets MISC 1 application by Does not apply route 2 (two) times daily. 08/23/20   Dani Gobble, NP  allopurinol (ZYLOPRIM) 100 MG tablet Take 100 mg by mouth daily.  02/12/19   [provider]  ALPRAZolam Prudy Feeler) 1 MG tablet Take 0.5 mg by mouth at bedtime as needed for anxiety or sleep.    [provider]  amoxicillin (AMOXIL) 875 MG tablet Take 875 mg by mouth 2 (two) times daily. 02/27/23   [provider]  benzonatate (TESSALON) 200 MG capsule Take 200 mg by mouth 3 (three) times daily. 10/08/22    [provider]  Cholecalciferol (VITAMIN D3) 50 MCG (2000 UT) TABS Take 2,000 Units by mouth daily.    [provider]  doxycycline (VIBRAMYCIN) 100 MG capsule Take 1 capsule (100 mg total) by mouth 2 (two) times daily. 03/04/23   Terrilee Files, MD  DULoxetine (CYMBALTA) 60 MG capsule Take 60 mg by mouth daily. 10/20/20   [provider]  escitalopram (LEXAPRO) 10 MG tablet Take 10 mg by mouth daily. 01/02/22   [provider]  fexofenadine (ALLEGRA) 180 MG tablet Take 180 mg by mouth daily.    [provider]  finasteride (PROSCAR) 5 MG tablet TAKE ONE TABLET (5MG  TOTAL) BY MOUTH DAILY 03/21/23   Bjorn Pippin, MD  gabapentin (NEURONTIN) 300 MG capsule Take 300 mg by mouth 2 (two) times daily.     [provider]  glipiZIDE (GLUCOTROL XL) 5 MG 24 hr tablet TAKE ONE TABLET (5MG  TOTAL) BY MOUTH DAILY WITH BREAKFAST 03/14/23   [name] J, NP  glucose blood test strip 1 each by Other route daily before breakfast. Use as instructed 09/26/20   Dani Gobble, NP  Hydrocod Poli-Chlorphe Poli ER 10-8 MG CP12 Take 5 mLs by mouth at bedtime as needed. 03/04/23   Terrilee Files, MD  metFORMIN (GLUCOPHAGE-XR) 500 MG 24 hr tablet Take 1 tablet (500 mg total) by mouth 2 (two) times daily with a meal. 02/12/23   Dani Gobble, NP  pantoprazole (PROTONIX) 40 MG tablet TAKE ONE  TABLET (40MG  TOTAL) BY MOUTH TWO TIMES DAILY BEFORE A MEAL 03/27/23   Rourk, Gerrit Friends, MD  pioglitazone (ACTOS) 30 MG tablet TAKE ONE TABLET (30MG  TOTAL) BY MOUTH DAILY 04/24/23   Dani Gobble, NP  rosuvastatin (CRESTOR) 10 MG tablet TAKE ONE TABLET (10MG  TOTAL) BY MOUTH DAILY 12/07/22   Jonelle Sidle, MD  Semaglutide (RYBELSUS) 3 MG TABS Take 1 tablet (3 mg total) by mouth daily. 03/05/23   Dani Gobble, NP  sildenafil (VIAGRA) 50 MG tablet Take 50 mg by mouth daily. 12/21/21   [provider]  tadalafil (CIALIS) 20 MG tablet Take 1 tablet (20 mg total) by  mouth daily as needed for erectile dysfunction. 06/07/22   Bjorn Pippin, MD  tamsulosin (FLOMAX) 0.4 MG CAPS capsule TAKE ONE CAPSULE (0.4MG  TOTAL) BY MOUTH TWO TIMES DAILY 03/04/23   Bjorn Pippin, MD  traMADol (ULTRAM) 50 MG tablet Take 1 tablet (50 mg total) by mouth every 6 (six) hours as needed for moderate pain (pain.). 08/18/20   Bjorn Pippin, MD  warfarin (COUMADIN) 5 MG tablet TAKE ONE TABLET BY MOUTH DAILY EXCEPT TAKE ONE AND ONE-HALF TABLETS ON TUESDAYS AND FRIDAYS OR AS DIRECTED 05/01/23   Jonelle Sidle, MD      Allergies    Prednisone    Review of Systems   Review of Systems  All other systems reviewed and are negative.   Physical Exam Updated Vital Signs BP 113/70   Pulse (!) 103   Temp (!) 102.8 F (39.3 C) (Oral)   Resp (!) 30   Ht 5\' 9"  (1.753 m)   Wt 96.6 kg   SpO2 95%   BMI 31.45 kg/m  Physical Exam Vitals and nursing note reviewed.  Constitutional:      Appearance: He is well-developed.     Comments: Somnolent, but answers questions appropriately  HENT:     Head: Atraumatic.  Neck:     Vascular: No JVD.  Cardiovascular:     Rate and Rhythm: Tachycardia present. Rhythm irregular.  Pulmonary:     Effort: Pulmonary effort is normal.     Breath sounds: Examination of the right-lower field reveals rhonchi. Rhonchi present.  Musculoskeletal:     Cervical back: Neck supple.     Right lower leg: No edema.     Left lower leg: No edema.  Skin:    General: Skin is warm.  Neurological:     Mental Status: He is oriented to person, place, and time.     ED Results / Procedures / Treatments   Labs (all labs ordered are listed, but only abnormal results are displayed) Labs Reviewed  COMPREHENSIVE METABOLIC PANEL - Abnormal; Notable for the following components:      Result Value   Glucose, Bld 164 (*)    BUN 24 (*)    Creatinine, Ser 1.34 (*)    Calcium 8.8 (*)    Total Protein 6.1 (*)    GFR, Estimated 53 (*)    All other components within normal limits   LACTIC ACID, PLASMA - Abnormal; Notable for the following components:   Lactic Acid, Venous 2.7 (*)    All other components within normal limits  PROTIME-INR - Abnormal; Notable for the following components:   Prothrombin Time 21.0 (*)    INR 1.8 (*)    All other components within normal limits  CBG MONITORING, ED - Abnormal; Notable for the following components:   Glucose-Capillary 189 (*)    All other components  within normal limits  CULTURE, BLOOD (ROUTINE X 2)  CULTURE, BLOOD (ROUTINE X 2)  RESP PANEL BY RT-PCR (RSV, FLU A&B, COVID)  RVPGX2  CBC WITH DIFFERENTIAL/PLATELET  LACTIC ACID, PLASMA  URINALYSIS, W/ REFLEX TO CULTURE (INFECTION SUSPECTED)  I-STAT VENOUS BLOOD GAS, ED    EKG EKG Interpretation  Date/Time:  Friday May 03 2023 06:59:39 EDT Ventricular Rate:  92 PR Interval:  69 QRS Duration: 117 QT Interval:  379 QTC Calculation: 469 R Axis:   48 Text Interpretation: Sinus rhythm Short PR interval Incomplete right bundle branch block No acute changes No significant change since last tracing Confirmed by Derwood Kaplan ([id number]) on 05/03/2023 7:07:49 AM  Radiology DG Chest 2 View  Result Date: 05/03/2023 CLINICAL DATA:  82 year old male with suspected sepsis. EXAM: CHEST - 2 VIEW COMPARISON:  Chest CT 03/04/2023 and earlier. FINDINGS: AP and lateral views of the chest at 0717 hours. Multifocal confluent but vague opacity in the right lung is new, basilar predominant and on the lateral view middle and lower lobe involvement is suspected. Stable lung volumes. Stable cardiac size and mediastinal contours. Visualized tracheal air column is within normal limits. No pneumothorax. No pleural effusion. No acute osseous abnormality identified. Paucity of bowel gas. Stable cholecystectomy clips. IMPRESSION: Evidence of Acute Multilobar Right Lung Pneumonia. No pleural effusion. Electronically Signed   By: Odessa Fleming M.D.   On: 05/03/2023 07:35    Procedures .Critical  Care  Performed by: Derwood Kaplan, MD Authorized by: Derwood Kaplan, MD   Critical care provider statement:    Critical care time (minutes):  37   Critical care was necessary to treat or prevent imminent or life-threatening deterioration of the following conditions:  Sepsis and respiratory failure   Critical care was time spent personally by me on the following activities:  Development of treatment plan with patient or surrogate, discussions with consultants, evaluation of patient's response to treatment, examination of patient, ordering and review of laboratory studies, ordering and review of radiographic studies, ordering and performing treatments and interventions, pulse oximetry, re-evaluation of patient's condition, review of old charts and obtaining history from patient or surrogate     Medications Ordered in ED Medications  lactated ringers infusion (has no administration in time range)  sodium chloride 0.9 % bolus 1,000 mL (has no administration in time range)  cefTRIAXone (ROCEPHIN) 2 g in sodium chloride 0.9 % 100 mL IVPB (has no administration in time range)  azithromycin (ZITHROMAX) 500 mg in sodium chloride 0.9 % 250 mL IVPB (has no administration in time range)  acetaminophen (TYLENOL) tablet 1,000 mg (1,000 mg Oral Given 05/03/23 6045)    ED Course/ Medical Decision Making/ A&P                             Medical Decision Making Amount and/or Complexity of Data Reviewed Labs: ordered. Radiology: ordered.  Risk OTC drugs. Prescription drug management. Decision regarding hospitalization.   This patient presents to the ED with chief complaint(s) of shortness of breath, confusion with pertinent past medical history of interstitial lung disease, A-fib on Coumadin and currently patient is on prednisone and antibiotics.The complaint involves an extensive differential diagnosis and also carries with it a high risk of complications and morbidity.    The differential  diagnosis includes : Acute hypoxic respiratory failure due to community-acquired pneumonia, pulmonary edema, pleural effusion. Other possibilities include flu, viral illness and COPD exacerbation. Pulmonary embolism considered, however patient is  on Coumadin which makes it a little less likely.  Additionally, he has a high-grade fever and right-sided rhonchorous breath sounds + CT chest with contrast in April that did not indicate pulmonary embolism.  Current suspicion is that patient has severe sepsis with endorgan damage being lungs as patient is on 6 L of oxygen.  He is also encephalopathic, likely because of his fever.  We will ensure there is no profound electrolyte abnormality.  The initial plan is to get x-ray of the chest, initiate sepsis workup and call code sepsis.   Additional history obtained: Additional history obtained from patient's girlfriend, provides substantial part of the history. Records reviewed  recent CT chest with contrast, outpatient PCP documentation about patient's respiratory illness  Independent labs interpretation:  The following labs were independently interpreted: Patient has elevated lactate of 2.7.  Creatinine is also slightly elevated.  Independent visualization and interpretation of imaging: - I independently visualized the following imaging with scope of interpretation limited to determining acute life threatening conditions related to emergency care: X-ray of the chest, which revealed findings consistent with respiratory exam finding, with right lower lobe consolidation.  Treatment and Reassessment: Patient reassessed.  I will add venous blood gas to ensure there is no hypercapnia. He will remain on oxygen.  At this time I do not think he needs BiPAP, we will give him a nebulizer treatment.  Patient will need admission to the hospital for CAP and severe sepsis.  Final Clinical Impression(s) / ED Diagnoses Final diagnoses:  Acute hypoxic respiratory  failure (HCC)  Severe sepsis (HCC)  Community acquired pneumonia of right lower lobe of lung    Rx / DC Orders ED Discharge Orders     None         Derwood Kaplan, MD 05/03/23 [phone]

## 2023-05-04 ENCOUNTER — Inpatient Hospital Stay (HOSPITAL_COMMUNITY): Payer: Medicare Other

## 2023-05-04 DIAGNOSIS — A419 Sepsis, unspecified organism: Secondary | ICD-10-CM | POA: Diagnosis not present

## 2023-05-04 DIAGNOSIS — J189 Pneumonia, unspecified organism: Secondary | ICD-10-CM | POA: Diagnosis not present

## 2023-05-04 DIAGNOSIS — R0602 Shortness of breath: Secondary | ICD-10-CM | POA: Diagnosis not present

## 2023-05-04 LAB — ECHOCARDIOGRAM COMPLETE
Area-P 1/2: 3.88 cm2
Calc EF: 70.1 %
Height: 74 in
S' Lateral: 3 cm
Single Plane A2C EF: 73.5 %
Single Plane A4C EF: 66.5 %
Weight: 3559.11 oz

## 2023-05-04 LAB — TROPONIN I (HIGH SENSITIVITY)
Troponin I (High Sensitivity): 42 ng/L — ABNORMAL HIGH (ref <18)
Troponin I (High Sensitivity): 36 ng/L — ABNORMAL HIGH (ref <18)

## 2023-05-04 LAB — CULTURE, BLOOD (ROUTINE X 2): Culture: NO GROWTH

## 2023-05-04 LAB — GLUCOSE, CAPILLARY
Glucose-Capillary: 143 mg/dL — ABNORMAL HIGH (ref 70–99)
Glucose-Capillary: 224 mg/dL — ABNORMAL HIGH (ref 70–99)
Glucose-Capillary: 197 mg/dL — ABNORMAL HIGH (ref 70–99)
Glucose-Capillary: 217 mg/dL — ABNORMAL HIGH (ref 70–99)

## 2023-05-04 LAB — PROTIME-INR
INR: 2.2 — ABNORMAL HIGH (ref 0.8–1.2)
Prothrombin Time: 24.8 seconds — ABNORMAL HIGH (ref 11.4–15.2)

## 2023-05-04 LAB — BRAIN NATRIURETIC PEPTIDE: B Natriuretic Peptide: 434 pg/mL — ABNORMAL HIGH (ref 0.0–100.0)

## 2023-05-04 LAB — SEDIMENTATION RATE: Sed Rate: 23 mm/hr — ABNORMAL HIGH (ref 0–16)

## 2023-05-04 LAB — C-REACTIVE PROTEIN: CRP: 14 mg/dL — ABNORMAL HIGH (ref <1.0)

## 2023-05-04 MED ORDER — ALPRAZOLAM 0.25 MG PO TABS
0.2500 mg | ORAL_TABLET | Freq: Once | ORAL | Status: AC
  Administered 2023-05-04: 0.25 mg via ORAL
  Filled 2023-05-04: qty 1

## 2023-05-04 MED ORDER — WARFARIN SODIUM 5 MG PO TABS
5.0000 mg | ORAL_TABLET | Freq: Once | ORAL | Status: AC
  Administered 2023-05-04: 5 mg via ORAL
  Filled 2023-05-04: qty 1

## 2023-05-04 MED ORDER — MELATONIN 3 MG PO TABS
6.0000 mg | ORAL_TABLET | Freq: Once | ORAL | Status: AC
  Administered 2023-05-04: 6 mg via ORAL
  Filled 2023-05-04: qty 2

## 2023-05-04 MED ORDER — IOHEXOL 350 MG/ML SOLN
100.0000 mL | Freq: Once | INTRAVENOUS | Status: AC | PRN
  Administered 2023-05-04: 100 mL via INTRAVENOUS

## 2023-05-04 MED ORDER — BENZONATATE 100 MG PO CAPS
200.0000 mg | ORAL_CAPSULE | Freq: Three times a day (TID) | ORAL | Status: DC | PRN
  Administered 2023-05-04: 200 mg via ORAL
  Filled 2023-05-04: qty 2

## 2023-05-04 MED ORDER — GUAIFENESIN 100 MG/5ML PO LIQD
10.0000 mL | ORAL | Status: DC | PRN
  Administered 2023-05-05: 10 mL via ORAL
  Filled 2023-05-04: qty 10

## 2023-05-04 MED ORDER — BUDESONIDE 0.5 MG/2ML IN SUSP
0.5000 mg | Freq: Two times a day (BID) | RESPIRATORY_TRACT | Status: AC
  Administered 2023-05-04 (×2): 0.5 mg via RESPIRATORY_TRACT
  Filled 2023-05-04 (×2): qty 2

## 2023-05-04 MED ORDER — ACETAMINOPHEN 325 MG PO TABS
650.0000 mg | ORAL_TABLET | Freq: Three times a day (TID) | ORAL | Status: DC | PRN
  Administered 2023-05-04: 650 mg via ORAL
  Filled 2023-05-04: qty 2

## 2023-05-04 MED ORDER — GUAIFENESIN 100 MG/5ML PO LIQD
15.0000 mL | Freq: Once | ORAL | Status: AC
  Administered 2023-05-04: 15 mL via ORAL
  Filled 2023-05-04: qty 15

## 2023-05-04 NOTE — Progress Notes (Signed)
  Echocardiogram 2D Echocardiogram has been performed.  [name] 05/04/2023, 1:48 PM

## 2023-05-04 NOTE — Progress Notes (Signed)
Patient son [name] called, wanted an update on his father. Update given. Dusty also requesting that the attending MD for today call and update him with what the CT Angio results came back, along with lab work. He can be reached at [phone].

## 2023-05-04 NOTE — Progress Notes (Signed)
Patient back on unit, in bed. Call light within reach. Bed alarm on.

## 2023-05-04 NOTE — Progress Notes (Signed)
PROGRESS NOTE     [name], is a 82 y.o. male, DOB - [birth year], QMV:[id number]  Admit date - 05/03/2023   Admitting Physician Espiridion Supinski Mariea Clonts, MD  Outpatient Primary MD for the patient is Elfredia Nevins, MD  LOS - 1  Chief Complaint  Patient presents with   Shortness of Breath        Brief Narrative:   82 y.o. male with past medical history relevant for PAFib on Coumadin, restless leg syndrome, interstitial lung disease, DM2, HTN, BPH and GERD admitted on 05/03/23 with Sepsis secondary to right-sided pneumonia leading to acute metabolic encephalopathy, AKI and acute hypoxic respiratory failure    -Assessment and Plan: 1)Sepsis secondary to right-sided pneumonia--POA-- -patient met sepsis criteria on admission, also had organ dysfunction including acute hypoxic respiratory failure, AKI and acute metabolic encephalopathy -history of underlying interstitial lung disease -Chest x-ray and CTA chest consistent with extensive right-sided pneumonia in all lobes, no PE -COVID, flu and RSV negative Lactic acid 2.7 improved to 1.9 with IV fluids -Continue IV Rocephin/azithromycin pending culture data --c/n mucolytic's and bronchodilators-   2)Post-Tussive Chest Pains-----suspect chest pains are related to right-sided pneumonia with persistent cough -EKG sinus rhythm without acute ACS type findings Troponin 42 >> 36 CTA chest without PE, right-sided pneumonia noted as above #1 -CTA chest also showed chronic pericardial calcification, 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---ESR is only 23, CRP and echo pending  3)AKI----acute kidney injury  --Creatinine --1.34---baseline 0.9 to 1.0,  -Hydrate IV and orally --renally adjust medications, avoid nephrotoxic agents / dehydration  / hypotension   4)PAFib--- INR ---therapeutic -Currently in sinus rhythm -Continue Coumadin for stroke prophylaxis   5) acute metabolic encephalopathy--- due to #1 #3 above -As per  patient's older brother [name] was at bedside patient's mentation has improved, getting close to baseline, --Anticipate further improvement with treatment of #1 #3  above   6)Generalized weakness/deconditioning and ambulatory dysfunction-- -family reports that patient was diagnosed with COVID respiratory infection back in November 2023 and has had on and off medical issues especially respiratory issues since then -Has been treated recently with antibiotics and steroids -Patient apparently has been confused and disoriented on and off for the last 24 hours or so -Hold off on PT eval until medically improves   7)DM2- A1c 7.9 reflecting uncontrolled DM with hyperglycemia PTA -Hold metformin , Actos and glipizide Use Novolog/Humalog Sliding scale insulin with Accu-Cheks/Fingersticks as ordered    8)BPH--continue Flomax   9) anxiety disorder--hold Xanax especially with lethargy and encephalopathy -Restart Lexapro   10)HLD--c/n Crestor   11)GERD--- continue Protonix  12) acute hypoxic respiratory failure--- due to pneumonia as above #1 -Oxygen has been weaned down to 3 L from 4 L --Was Not on O2 prior to admission    Status is: Inpatient   Disposition: The patient is from: Home              Anticipated d/c is to: Home with [facility]               Anticipated d/c date is: 2 days              Patient currently is not medically stable to d/c. Barriers: Not Clinically Stable-   Code Status :  -  Code Status: Prior   Family Communication:    (patient is alert, awake and coherent)  Discussed with Brother [name] at bedside and son [name] by phone [phone])  DVT Prophylaxis  :   - SCDs /warfarin (COUMADIN)  tablet 5 mg   Lab Results  Component Value Date   PLT 178 05/03/2023    Inpatient Medications  Scheduled Meds:  budesonide (PULMICORT) nebulizer solution  0.5 mg Nebulization BID   dextromethorphan-guaiFENesin  1 tablet Oral BID   escitalopram  10 mg Oral Daily   guaiFENesin  15 mL  Oral Once   insulin aspart  0-5 Units Subcutaneous QHS   insulin aspart  0-6 Units Subcutaneous TID WC   ipratropium-albuterol  3 mL Nebulization Q6H   pantoprazole  40 mg Oral Daily   rosuvastatin  10 mg Oral Daily   tamsulosin  0.4 mg Oral QPC supper   warfarin  5 mg Oral ONCE-1600   Warfarin - Pharmacist Dosing Inpatient   Does not apply q1600   Continuous Infusions:  azithromycin 500 mg (05/04/23 0843)   cefTRIAXone (ROCEPHIN)  IV 2 g (05/04/23 1100)   PRN Meds:.acetaminophen, albuterol, guaiFENesin   Anti-infectives (From admission, onward)    Start     Dose/Rate Route Frequency Ordered Stop   05/03/23 0800  cefTRIAXone (ROCEPHIN) 2 g in sodium chloride 0.9 % 100 mL IVPB        2 g 200 mL/hr over 30 Minutes Intravenous Every 24 hours 05/03/23 0752 05/08/23 0759   05/03/23 0800  azithromycin (ZITHROMAX) 500 mg in sodium chloride 0.9 % 250 mL IVPB        500 mg 250 mL/hr over 60 Minutes Intravenous Every 24 hours 05/03/23 0752 05/08/23 0759         Subjective: [name] today has no fevers, no emesis,  No chest pain,   - Productive cough with blood-tinged sputum persist -More talkative more interactive  -As per patient's older brother [name] was at bedside patient's mentation has improved, getting close to baseline,  Objective: Vitals:   05/04/23 0436 05/04/23 0446 05/04/23 0821 05/04/23 0848  BP: (!) 110/58  (!) 121/54   Pulse: 77  88   Resp: 20  18   Temp: 98.6 F (37 C)  98.4 F (36.9 C)   TempSrc: Oral  Oral   SpO2: 96% 96% 98% 97%  Weight:      Height:        Intake/Output Summary (Last 24 hours) at 05/04/2023 1118 Last data filed at 05/04/2023 0701 Gross per 24 hour  Intake 3340.91 ml  Output 1800 ml  Net 1540.91 ml   Filed Weights   05/03/23 0704 05/03/23 0855  Weight: 96.6 kg 100.9 kg    Physical Exam Gen:- Awake Alert, in no acute distress, speaking in sentences HEENT:- [place].AT, No sclera icterus Nose- [place] 3L/min Neck-Supple Neck,No JVD,.   Lungs-breath sounds especially on the right, few scattered rhonchi  CV- S1, S2 normal, regular  Abd-  +ve B.Sounds, Abd Soft, No tenderness,    Extremity/Skin:- No  edema, pedal pulses present  Psych-affect is appropriate, oriented x3... No further confusion or psychosis Neuro-generalized weakness, no new focal deficits, no tremors  Data Reviewed: I have personally reviewed following labs and imaging studies  CBC: Recent Labs  Lab 05/03/23 0700  WBC 8.3  NEUTROABS 7.2  HGB 14.1  HCT 42.3  MCV 94.2  PLT 178   Basic Metabolic Panel: Recent Labs  Lab 05/03/23 0700  NA 136  K 4.0  CL 101  CO2 25  GLUCOSE 164*  BUN 24*  CREATININE 1.34*  CALCIUM 8.8*   GFR: Estimated Creatinine Clearance: 54.9 mL/min (A) (by C-G formula based on SCr of 1.34 mg/dL (H)). Liver Function  Tests: Recent Labs  Lab 05/03/23 0700  AST 23  ALT 21  ALKPHOS 53  BILITOT 1.2  PROT 6.1*  ALBUMIN 3.8   Cardiac Enzymes: No results for input(s): "CKTOTAL", "CKMB", "CKMBINDEX", "TROPONINI" in the last 168 hours. BNP (last 3 results) No results for input(s): "PROBNP" in the last 8760 hours. HbA1C: No results for input(s): "HGBA1C" in the last 72 hours. Sepsis Labs: @LABRCNTIP (procalcitonin:4,lacticidven:4) ) Recent Results (from the past 240 hour(s))  Culture, blood (Routine x 2)     Status: None (Preliminary result)   Collection Time: 05/03/23  7:00 AM   Specimen: Right Antecubital; Blood  Result Value Ref Range Status   Specimen Description   Final    RIGHT ANTECUBITAL BOTTLES DRAWN AEROBIC AND ANAEROBIC   Special Requests Blood Culture adequate volume  Final   Culture   Final    NO GROWTH < 24 HOURS Performed at [facility], [address] [place], [place] [address]    Report Status PENDING  Incomplete  Culture, blood (Routine x 2)     Status: None (Preliminary result)   Collection Time: 05/03/23  7:00 AM   Specimen: BLOOD RIGHT HAND  Result Value Ref Range Status   Specimen  Description   Final    BLOOD RIGHT HAND BOTTLES DRAWN AEROBIC AND ANAEROBIC   Special Requests Blood Culture adequate volume  Final   Culture   Final    NO GROWTH < 24 HOURS Performed at [facility], [address], [place], [place] [address]    Report Status PENDING  Incomplete  Resp panel by RT-PCR (RSV, Flu A&B, Covid) Anterior Nasal Swab     Status: None   Collection Time: 05/03/23  7:27 AM   Specimen: Anterior Nasal Swab  Result Value Ref Range Status   SARS Coronavirus 2 by RT PCR NEGATIVE NEGATIVE Final    Comment: (NOTE) SARS-CoV-2 target nucleic acids are NOT DETECTED.  The SARS-CoV-2 RNA is generally detectable in upper respiratory specimens during the acute phase of infection. The lowest concentration of SARS-CoV-2 viral copies this assay can detect is 138 copies/mL. A negative result does not preclude SARS-Cov-2 infection and should not be used as the sole basis for treatment or other patient management decisions. A negative result may occur with  improper specimen collection/handling, submission of specimen other than nasopharyngeal swab, presence of viral mutation(s) within the areas targeted by this assay, and inadequate number of viral copies(<138 copies/mL). A negative result must be combined with clinical observations, patient history, and epidemiological information. The expected result is Negative.  Fact Sheet for Patients:  BloggerCourse.com  Fact Sheet for Healthcare Providers:  SeriousBroker.it  This test is no t yet approved or cleared by the [place] FDA and  has been authorized for detection and/or diagnosis of SARS-CoV-2 by FDA under an Emergency Use Authorization (EUA). This EUA will remain  in effect (meaning this test can be used) for the duration of the COVID-19 declaration under Section 564(b)(1) of the Act, 21 U.S.C.section 360bbb-3(b)(1), unless the authorization is terminated  or revoked  sooner.       Influenza A by PCR NEGATIVE NEGATIVE Final   Influenza B by PCR NEGATIVE NEGATIVE Final    Comment: (NOTE) The Xpert Xpress SARS-CoV-2/FLU/RSV plus assay is intended as an aid in the diagnosis of influenza from Nasopharyngeal swab specimens and should not be used as a sole basis for treatment. Nasal washings and aspirates are unacceptable for Xpert Xpress SARS-CoV-2/FLU/RSV testing.  Fact Sheet for Patients:  BloggerCourse.com  Fact Sheet for Healthcare Providers: SeriousBroker.it  This test is not yet approved or cleared by the [place] FDA and has been authorized for detection and/or diagnosis of SARS-CoV-2 by FDA under an Emergency Use Authorization (EUA). This EUA will remain in effect (meaning this test can be used) for the duration of the COVID-19 declaration under Section 564(b)(1) of the Act, 21 U.S.C. section 360bbb-3(b)(1), unless the authorization is terminated or revoked.     Resp Syncytial Virus by PCR NEGATIVE NEGATIVE Final    Comment: (NOTE) Fact Sheet for Patients: BloggerCourse.com  Fact Sheet for Healthcare Providers: SeriousBroker.it  This test is not yet approved or cleared by the [place] FDA and has been authorized for detection and/or diagnosis of SARS-CoV-2 by FDA under an Emergency Use Authorization (EUA). This EUA will remain in effect (meaning this test can be used) for the duration of the COVID-19 declaration under Section 564(b)(1) of the Act, 21 U.S.C. section 360bbb-3(b)(1), unless the authorization is terminated or revoked.  Performed at [facility], [address] [place], [place] [address]       Radiology Studies: CT Angio Chest Pulmonary Embolism (PE) W or WO Contrast  Result Date: 05/04/2023 CLINICAL DATA:  82 year old male with evidence of multilobar pneumonia on chest radiographs yesterday. Sepsis. Possible  PE. EXAM: CT ANGIOGRAPHY CHEST WITH CONTRAST TECHNIQUE: Multidetector CT imaging of the chest was performed using the standard protocol during bolus administration of intravenous contrast. Multiplanar CT image reconstructions and MIPs were obtained to evaluate the vascular anatomy. RADIATION DOSE REDUCTION: This exam was performed according to the departmental dose-optimization program which includes automated exposure control, adjustment of the mA and/or kV according to patient size and/or use of iterative reconstruction technique. CONTRAST:  OMNIPAQUE IOHEXOL 350 MG/ML SOLN COMPARISON:  Chest radiographs yesterday. Prior chest CT 03/04/2023. FINDINGS: Cardiovascular: Adequate contrast bolus timing in the pulmonary arterial tree. No pulmonary artery filling defect. Some central pulmonary artery enlargement (series 5, image 123) appears chronic. Calcified aortic atherosclerosis. Left vertebral artery arises directly from the aortic arch, normal variant. Mild to moderate cardiomegaly. Pericardial thickening and calcifications, chronic (series 5, image 192). No acute pericardial effusion. Calcified coronary artery atherosclerosis (series 5, image 141). Mediastinum/Nodes: Small but reactive appearing right hilar and mediastinal lymph nodes appear increased compared to April. No mediastinal mass. Lungs/Pleura: Trace layering pleural effusions are new since April. Mildly lower lung volumes. Right lung acute rounded, confluent areas of peribronchial sub solid opacity with early consolidation in all lobes. Major airways remain patent. Superimposed dependent atelectasis bilaterally. No definite acute inflammation in the left lung at this time. Upper Abdomen: Cholecystectomy. Negative visible liver, spleen, stomach and adrenal glands. No free air or free fluid in the upper abdomen. Musculoskeletal: Stable.  No acute osseous abnormality identified. Review of the MIP images confirms the above findings. IMPRESSION: 1.  Negative for acute pulmonary embolus. Chronic pulmonary artery enlargement suggesting a degree of pulmonary artery hypertension. 2. Acute Right Lung Pneumonia affecting all lobes. Multifocal bronchopneumonia pattern, consider viral or atypical respiratory infection. Trace pleural effusions and small reactive mediastinal lymph nodes. 3. 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 4. Coronary artery and aortic Atherosclerosis (ICD10-I70.0). Electronically Signed   By: Odessa Fleming M.D.   On: 05/04/2023 04:24   DG Chest 2 View  Result Date: 05/03/2023 CLINICAL DATA:  82 year old male with suspected sepsis. EXAM: CHEST - 2 VIEW COMPARISON:  Chest CT 03/04/2023 and earlier. FINDINGS: AP and lateral views of the  chest at 0717 hours. Multifocal confluent but vague opacity in the right lung is new, basilar predominant and on the lateral view middle and lower lobe involvement is suspected. Stable lung volumes. Stable cardiac size and mediastinal contours. Visualized tracheal air column is within normal limits. No pneumothorax. No pleural effusion. No acute osseous abnormality identified. Paucity of bowel gas. Stable cholecystectomy clips. IMPRESSION: Evidence of Acute Multilobar Right Lung Pneumonia. No pleural effusion. Electronically Signed   By: Odessa Fleming M.D.   On: 05/03/2023 07:35     Scheduled Meds:  budesonide (PULMICORT) nebulizer solution  0.5 mg Nebulization BID   dextromethorphan-guaiFENesin  1 tablet Oral BID   escitalopram  10 mg Oral Daily   guaiFENesin  15 mL Oral Once   insulin aspart  0-5 Units Subcutaneous QHS   insulin aspart  0-6 Units Subcutaneous TID WC   ipratropium-albuterol  3 mL Nebulization Q6H   pantoprazole  40 mg Oral Daily   rosuvastatin  10 mg Oral Daily   tamsulosin  0.4 mg Oral QPC supper   warfarin  5 mg Oral ONCE-1600   Warfarin - Pharmacist Dosing Inpatient   Does not apply q1600   Continuous Infusions:  azithromycin 500 mg  (05/04/23 0843)   cefTRIAXone (ROCEPHIN)  IV 2 g (05/04/23 1100)     LOS: 1 day    Shon Hale M.D on 05/04/2023 at 11:18 AM  Go to [email or web address] - for contact info  [facility] - Office  [phone]  If 7PM-7AM, please contact night-coverage [email or web address] 05/04/2023, 11:18 AM

## 2023-05-04 NOTE — Progress Notes (Signed)
ANTICOAGULATION CONSULT NOTE - Initial Consult  Pharmacy Consult for Warfarin Indication: atrial fibrillation  Allergies  Allergen Reactions   Prednisone     Patient state that this medication made his blood sugar go real high    Patient Measurements: Height: 6\' 2"  (188 cm) Weight: 100.9 kg (222 lb 7.1 oz) IBW/kg (Calculated) : 82.2  Vital Signs: Temp: 98.4 F (36.9 C) (06/15 0821) Temp Source: Oral (06/15 0821) BP: 121/54 (06/15 0821) Pulse Rate: 88 (06/15 0821)  Labs: Recent Labs    05/03/23 0700 05/04/23 0259 05/04/23 0442  HGB 14.1  --   --   HCT 42.3  --   --   PLT 178  --   --   LABPROT 21.0* 24.8*  --   INR 1.8* 2.2*  --   CREATININE 1.34*  --   --   TROPONINIHS  --  42* 36*    Estimated Creatinine Clearance: 54.9 mL/min (A) (by C-G formula based on SCr of 1.34 mg/dL (H)).  Assessment: 82 year old male hx Afib, recently treated with doxy for bronchitis now admitted with SOB/fevers/sepsis. Abx initiated and pharmacy consulted to resume warfarin therapy. INR at goal today - expect patient may show coumadin sensitivity given acute illness and abx on board.  Patient is followed by the coumadin clinic. Home regimen 5mg  daily except 7.5mg  Tue and Fri.   Goal of Therapy:  INR 2-3 Monitor platelets by anticoagulation protocol: Yes   Plan:  Warfarin 5mg  po x1 INR daily Monitor s/sx bleeding  Caryl Asp, PharmD Clinical Pharmacist 05/04/2023 8:42 AM

## 2023-05-04 NOTE — Progress Notes (Signed)
Patient transported to CT 

## 2023-05-04 NOTE — Progress Notes (Signed)
Pt c/o tightness in chest, coughing a lot. Cough is productive, congested and strong. Sputum amount is small with thick consistency noted. BP elevated 111/100, map 104. HR 82. Patient requested cough drops, received order for Tessalon. Given at 0237. MD Irena Cords placed orders for troponin, BNP, and EKG- 12 lead. MD Allena Katz at bedside at 0248. EKG done by Respiratory.

## 2023-05-05 DIAGNOSIS — J189 Pneumonia, unspecified organism: Secondary | ICD-10-CM | POA: Diagnosis not present

## 2023-05-05 DIAGNOSIS — A419 Sepsis, unspecified organism: Secondary | ICD-10-CM | POA: Diagnosis not present

## 2023-05-05 LAB — BASIC METABOLIC PANEL
Anion gap: 6 (ref 5–15)
BUN: 14 mg/dL (ref 8–23)
CO2: 24 mmol/L (ref 22–32)
Calcium: 8 mg/dL — ABNORMAL LOW (ref 8.9–10.3)
Chloride: 105 mmol/L (ref 98–111)
Creatinine, Ser: 0.97 mg/dL (ref 0.61–1.24)
GFR, Estimated: >60 mL/min (ref >60)
Glucose, Bld: 144 mg/dL — ABNORMAL HIGH (ref 70–99)
Potassium: 3.4 mmol/L — ABNORMAL LOW (ref 3.5–5.1)
Sodium: 135 mmol/L (ref 135–145)

## 2023-05-05 LAB — CBC
HCT: 30.1 % — ABNORMAL LOW (ref 39.0–52.0)
Hemoglobin: 9.9 g/dL — ABNORMAL LOW (ref 13.0–17.0)
MCH: 31.5 pg (ref 26.0–34.0)
MCHC: 32.9 g/dL (ref 30.0–36.0)
MCV: 95.9 fL (ref 80.0–100.0)
Platelets: 109 10*3/uL — ABNORMAL LOW (ref 150–400)
RBC: 3.14 MIL/uL — ABNORMAL LOW (ref 4.22–5.81)
RDW: 14.4 % (ref 11.5–15.5)
WBC: 11.3 10*3/uL — ABNORMAL HIGH (ref 4.0–10.5)
nRBC: 0 % (ref 0.0–0.2)

## 2023-05-05 LAB — PROTIME-INR
INR: 2.5 — ABNORMAL HIGH (ref 0.8–1.2)
Prothrombin Time: 27.2 seconds — ABNORMAL HIGH (ref 11.4–15.2)

## 2023-05-05 LAB — OCCULT BLOOD X 1 CARD TO LAB, STOOL: Fecal Occult Bld: NEGATIVE

## 2023-05-05 LAB — GLUCOSE, CAPILLARY
Glucose-Capillary: 139 mg/dL — ABNORMAL HIGH (ref 70–99)
Glucose-Capillary: 217 mg/dL — ABNORMAL HIGH (ref 70–99)
Glucose-Capillary: 172 mg/dL — ABNORMAL HIGH (ref 70–99)
Glucose-Capillary: 225 mg/dL — ABNORMAL HIGH (ref 70–99)

## 2023-05-05 MED ORDER — WARFARIN SODIUM 2 MG PO TABS
4.0000 mg | ORAL_TABLET | Freq: Once | ORAL | Status: AC
  Administered 2023-05-05: 4 mg via ORAL
  Filled 2023-05-05: qty 2

## 2023-05-05 MED ORDER — ALPRAZOLAM 0.25 MG PO TABS
0.2500 mg | ORAL_TABLET | Freq: Once | ORAL | Status: AC | PRN
  Administered 2023-05-05: 0.25 mg via ORAL
  Filled 2023-05-05: qty 1

## 2023-05-05 MED ORDER — POTASSIUM CHLORIDE CRYS ER 20 MEQ PO TBCR
40.0000 meq | EXTENDED_RELEASE_TABLET | Freq: Once | ORAL | Status: AC
  Administered 2023-05-05: 40 meq via ORAL
  Filled 2023-05-05: qty 2

## 2023-05-05 MED ORDER — TRAZODONE HCL 50 MG PO TABS
100.0000 mg | ORAL_TABLET | Freq: Every day | ORAL | Status: AC
  Administered 2023-05-05: 100 mg via ORAL
  Filled 2023-05-05: qty 2

## 2023-05-05 NOTE — Progress Notes (Signed)
ANTICOAGULATION CONSULT NOTE - Initial Consult  Pharmacy Consult for Warfarin Indication: atrial fibrillation  Allergies  Allergen Reactions   Prednisone     Patient state that this medication made his blood sugar go real high    Patient Measurements: Height: 6\' 2"  (188 cm) Weight: 100.9 kg (222 lb 7.1 oz) IBW/kg (Calculated) : 82.2  Vital Signs: Temp: 99.2 F (37.3 C) (06/16 0605) Temp Source: Oral (06/16 0605) BP: 107/59 (06/16 0605) Pulse Rate: 73 (06/16 0605)  Labs: Recent Labs    05/03/23 0700 05/04/23 0259 05/04/23 0442 05/05/23 0424 05/05/23 0639  HGB 14.1  --   --   --  9.9*  HCT 42.3  --   --   --  30.1*  PLT 178  --   --   --  109*  LABPROT 21.0* 24.8*  --  27.2*  --   INR 1.8* 2.2*  --  2.5*  --   CREATININE 1.34*  --   --  0.97  --   TROPONINIHS  --  42* 36*  --   --     Estimated Creatinine Clearance: 75.8 mL/min (by C-G formula based on SCr of 0.97 mg/dL).  Assessment: 82 year old male hx Afib, recently treated with doxy for bronchitis now admitted with SOB/fevers/sepsis. Abx initiated and pharmacy consulted to resume warfarin therapy. INR remains at goal - expect patient may show coumadin sensitivity given acute illness and abx on board.  Patient is followed by the coumadin clinic. Home regimen 5mg  daily except 7.5mg  Tue and Fri.   Goal of Therapy:  INR 2-3 Monitor platelets by anticoagulation protocol: Yes   Plan:  Warfarin 4 mg po x1 INR daily Monitor s/sx bleeding  Caryl Asp, PharmD Clinical Pharmacist 05/05/2023 8:20 AM

## 2023-05-05 NOTE — Progress Notes (Signed)
PROGRESS NOTE   [name], is a 82 y.o. male, DOB - [birth year], VWU:[id number]  Admit date - 05/03/2023   Admitting Physician Edwing Figley Mariea Clonts, MD  Outpatient Primary MD for the patient is Elfredia Nevins, MD  LOS - 2  Chief Complaint  Patient presents with   Shortness of Breath      Brief Narrative:   82 y.o. male with past medical history relevant for PAFib on Coumadin, restless leg syndrome, interstitial lung disease, DM2, HTN, BPH and GERD admitted on 05/03/23 with Sepsis secondary to right-sided pneumonia leading to acute metabolic encephalopathy, AKI and acute hypoxic respiratory failure    -Assessment and Plan: 1)Sepsis secondary to right-sided pneumonia--POA-- -patient met sepsis criteria on admission, also had organ dysfunction including acute hypoxic respiratory failure, AKI and acute metabolic encephalopathy -history of underlying interstitial lung disease -Chest x-ray and CTA chest consistent with extensive right-sided pneumonia in all lobes, no PE -COVID, flu and RSV negative Lactic acid 2.7 improved to 1.9 with IV fluids -Continue IV Rocephin/azithromycin pending culture data --c/n mucolytic's and bronchodilators-   2)Post-Tussive Chest Pains-----suspect chest pains are related to right-sided pneumonia with persistent cough -EKG sinus rhythm without acute ACS type findings Troponin 42 >> 36 CTA chest without PE, right-sided pneumonia noted as above #1 -CTA chest also showed chronic pericardial calcification, 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---ESR is only 23, CRP and echo pending  3)AKI----acute kidney injury  --Creatinine --1.34---baseline 0.9 to 1.0,  -Hydrate IV and orally --renally adjust medications, avoid nephrotoxic agents / dehydration  / hypotension   4)PAFib--- INR ---therapeutic -Currently in sinus rhythm -Continue Coumadin for stroke prophylaxis   5) acute metabolic encephalopathy--- due to #1 #3 above -As per patient's older  brother [name] was at bedside patient's mentation has improved, getting close to baseline, --Anticipate further improvement with treatment of #1 #3  above   6)Generalized weakness/deconditioning and ambulatory dysfunction-- -family reports that patient was diagnosed with COVID respiratory infection back in November 2023 and has had on and off medical issues especially respiratory issues since then -Has been treated recently with antibiotics and steroids -Patient apparently has been confused and disoriented on and off for the last 24 hours or so -Hold off on PT eval until medically improves   7)DM2- A1c 7.9 reflecting uncontrolled DM with hyperglycemia PTA -Hold metformin , Actos and glipizide Use Novolog/Humalog Sliding scale insulin with Accu-Cheks/Fingersticks as ordered    8)BPH--continue Flomax   9) anxiety disorder--hold Xanax especially with lethargy and encephalopathy -Restart Lexapro   10)HLD--c/n Crestor   11)GERD--- continue Protonix  12) acute hypoxic respiratory failure--- due to pneumonia as above #1 -Oxygen has been weaned down to 3 L   --Was Not on O2 prior to admission   13) hypokalemia--replace  14) anemia and  thrombocytopenia--- -Hgb down to 9.9 from 14.1 on admission suspect some hemodilution effect -Patient hemoglobin patient between 12 and 13 -Stool occult blood is negative -Platelets are down to 109 from 178--suspect related to acute infection -INR therapeutic  Status is: Inpatient   Disposition: The patient is from: Home              Anticipated d/c is to: Home with [facility]               Anticipated d/c date is: 2 days              Patient currently is not medically stable to d/c. Barriers: Not Clinically Stable-   Code Status :  -  Code Status: Prior   Family Communication:    (patient is alert, awake and coherent)  Previously discussed with Brother [name] at bedside and son Bethann Berkshire by phone [phone])  DVT Prophylaxis  :   - SCDs /  Lab Results   Component Value Date   PLT 109 (L) 05/05/2023    Inpatient Medications  Scheduled Meds:  dextromethorphan-guaiFENesin  1 tablet Oral BID   escitalopram  10 mg Oral Daily   insulin aspart  0-5 Units Subcutaneous QHS   insulin aspart  0-6 Units Subcutaneous TID WC   ipratropium-albuterol  3 mL Nebulization Q6H   pantoprazole  40 mg Oral Daily   rosuvastatin  10 mg Oral Daily   tamsulosin  0.4 mg Oral QPC supper   Warfarin - Pharmacist Dosing Inpatient   Does not apply q1600   Continuous Infusions:  azithromycin 500 mg (05/05/23 0856)   cefTRIAXone (ROCEPHIN)  IV 2 g (05/05/23 1032)   PRN Meds:.acetaminophen, albuterol, guaiFENesin   Anti-infectives (From admission, onward)    Start     Dose/Rate Route Frequency Ordered Stop   05/03/23 0800  cefTRIAXone (ROCEPHIN) 2 g in sodium chloride 0.9 % 100 mL IVPB        2 g 200 mL/hr over 30 Minutes Intravenous Every 24 hours 05/03/23 0752 05/08/23 0759   05/03/23 0800  azithromycin (ZITHROMAX) 500 mg in sodium chloride 0.9 % 250 mL IVPB        500 mg 250 mL/hr over 60 Minutes Intravenous Every 24 hours 05/03/23 0752 05/08/23 0759        Subjective: [name] today has no fevers, no emesis,  No chest pain,   - -Cough and dyspnea improving -Wanted to know if he can come off oxygen -Slept poorly overnight  Objective: Vitals:   05/04/23 2122 05/05/23 0605 05/05/23 1000 05/05/23 1323  BP: (!) 114/59 (!) 107/59  99/66  Pulse: (!) 103 73  68  Resp: 20 19  20   Temp: 99.1 F (37.3 C) 99.2 F (37.3 C)  98.7 F (37.1 C)  TempSrc: Oral Oral  Oral  SpO2: 97% 98% 94% 99%  Weight:      Height:        Intake/Output Summary (Last 24 hours) at 05/05/2023 1841 Last data filed at 05/05/2023 1752 Gross per 24 hour  Intake 480 ml  Output 200 ml  Net 280 ml   Filed Weights   05/03/23 0704 05/03/23 0855  Weight: 96.6 kg 100.9 kg    Physical Exam Gen:- Awake Alert, in no acute distress, speaking in complete sentences HEENT:-  [place].AT, No sclera icterus Nose- [place] 3L/min Neck-Supple Neck,No JVD,.  Lungs-diminished breath sounds especially on the right, no wheezing   CV- S1, S2 normal, regular  Abd-  +ve B.Sounds, Abd Soft, No tenderness,    Extremity/Skin:- No  edema, pedal pulses present  Psych-affect is appropriate, oriented x3... No further confusion or psychosis Neuro-generalized weakness, no new focal deficits, no tremors  Data Reviewed: I have personally reviewed following labs and imaging studies  CBC: Recent Labs  Lab 05/03/23 0700 05/05/23 0639  WBC 8.3 11.3*  NEUTROABS 7.2  --   HGB 14.1 9.9*  HCT 42.3 30.1*  MCV 94.2 95.9  PLT 178 109*   Basic Metabolic Panel: Recent Labs  Lab 05/03/23 0700 05/05/23 0424  NA 136 135  K 4.0 3.4*  CL 101 105  CO2 25 24  GLUCOSE 164* 144*  BUN 24* 14  CREATININE 1.34* 0.97  CALCIUM  8.8* 8.0*   GFR: Estimated Creatinine Clearance: 75.8 mL/min (by C-G formula based on SCr of 0.97 mg/dL). Liver Function Tests: Recent Labs  Lab 05/03/23 0700  AST 23  ALT 21  ALKPHOS 53  BILITOT 1.2  PROT 6.1*  ALBUMIN 3.8   Recent Results (from the past 240 hour(s))  Culture, blood (Routine x 2)     Status: None (Preliminary result)   Collection Time: 05/03/23  7:00 AM   Specimen: Right Antecubital; Blood  Result Value Ref Range Status   Specimen Description   Final    RIGHT ANTECUBITAL BOTTLES DRAWN AEROBIC AND ANAEROBIC   Special Requests Blood Culture adequate volume  Final   Culture   Final    NO GROWTH 2 DAYS Performed at [facility], [address] [place], [place] [address]    Report Status PENDING  Incomplete  Culture, blood (Routine x 2)     Status: None (Preliminary result)   Collection Time: 05/03/23  7:00 AM   Specimen: BLOOD RIGHT HAND  Result Value Ref Range Status   Specimen Description   Final    BLOOD RIGHT HAND BOTTLES DRAWN AEROBIC AND ANAEROBIC   Special Requests Blood Culture adequate volume  Final   Culture   Final    NO GROWTH  2 DAYS Performed at [facility], [address], [place], [place] [address]    Report Status PENDING  Incomplete  Resp panel by RT-PCR (RSV, Flu A&B, Covid) Anterior Nasal Swab     Status: None   Collection Time: 05/03/23  7:27 AM   Specimen: Anterior Nasal Swab  Result Value Ref Range Status   SARS Coronavirus 2 by RT PCR NEGATIVE NEGATIVE Final    Comment: (NOTE) SARS-CoV-2 target nucleic acids are NOT DETECTED.  The SARS-CoV-2 RNA is generally detectable in upper respiratory specimens during the acute phase of infection. The lowest concentration of SARS-CoV-2 viral copies this assay can detect is 138 copies/mL. A negative result does not preclude SARS-Cov-2 infection and should not be used as the sole basis for treatment or other patient management decisions. A negative result may occur with  improper specimen collection/handling, submission of specimen other than nasopharyngeal swab, presence of viral mutation(s) within the areas targeted by this assay, and inadequate number of viral copies(<138 copies/mL). A negative result must be combined with clinical observations, patient history, and epidemiological information. The expected result is Negative.  Fact Sheet for Patients:  BloggerCourse.com  Fact Sheet for Healthcare Providers:  SeriousBroker.it  This test is no t yet approved or cleared by the [place] FDA and  has been authorized for detection and/or diagnosis of SARS-CoV-2 by FDA under an Emergency Use Authorization (EUA). This EUA will remain  in effect (meaning this test can be used) for the duration of the COVID-19 declaration under Section 564(b)(1) of the Act, 21 U.S.C.section 360bbb-3(b)(1), unless the authorization is terminated  or revoked sooner.       Influenza A by PCR NEGATIVE NEGATIVE Final   Influenza B by PCR NEGATIVE NEGATIVE Final    Comment: (NOTE) The Xpert Xpress SARS-CoV-2/FLU/RSV plus  assay is intended as an aid in the diagnosis of influenza from Nasopharyngeal swab specimens and should not be used as a sole basis for treatment. Nasal washings and aspirates are unacceptable for Xpert Xpress SARS-CoV-2/FLU/RSV testing.  Fact Sheet for Patients: BloggerCourse.com  Fact Sheet for Healthcare Providers: SeriousBroker.it  This test is not yet approved or cleared by the [place] FDA and has been  authorized for detection and/or diagnosis of SARS-CoV-2 by FDA under an Emergency Use Authorization (EUA). This EUA will remain in effect (meaning this test can be used) for the duration of the COVID-19 declaration under Section 564(b)(1) of the Act, 21 U.S.C. section 360bbb-3(b)(1), unless the authorization is terminated or revoked.     Resp Syncytial Virus by PCR NEGATIVE NEGATIVE Final    Comment: (NOTE) Fact Sheet for Patients: BloggerCourse.com  Fact Sheet for Healthcare Providers: SeriousBroker.it  This test is not yet approved or cleared by the [place] FDA and has been authorized for detection and/or diagnosis of SARS-CoV-2 by FDA under an Emergency Use Authorization (EUA). This EUA will remain in effect (meaning this test can be used) for the duration of the COVID-19 declaration under Section 564(b)(1) of the Act, 21 U.S.C. section 360bbb-3(b)(1), unless the authorization is terminated or revoked.  Performed at [facility], [address], [place], [place] [address]      Radiology Studies: ECHOCARDIOGRAM COMPLETE  Result Date: 05/04/2023    ECHOCARDIOGRAM REPORT   Patient Name:   [name] Eng Date of Exam: 05/04/2023 Medical Rec #:  [id number]      Height:       74.0 in Accession #:    [id number]     Weight:       222.4 lb Date of Birth:  06/28/1941      BSA:          2.274 m Patient Age:    82 years       BP:           121/54 mmHg Patient Gender: M               HR:           83 bpm. [facility] Procedure: 2D Echo, Cardiac Doppler and Color Doppler Indications:    R06.02 SOB  History:        Patient has prior history of Echocardiogram examinations, most                 recent 04/19/2020. Abnormal ECG, Arrythmias:LBBB, Atrial                 Fibrillation and Atrial Flutter,                 Signs/Symptoms:Dizziness/Lightheadedness, Dyspnea and Shortness                 of Breath; Risk Factors:Dyslipidemia, Former Smoker and                 Hypertension. Pneumonia.  Sonographer:    Sheralyn Boatman RDCS Referring Phys: [id number] Aashvi Rezabek IMPRESSIONS  1. Left ventricular ejection fraction, by estimation, is 60 to 65%. The left ventricle has normal function. The left ventricle has no regional wall motion abnormalities. There is mild left ventricular hypertrophy. Left ventricular diastolic parameters are indeterminate.  2. Right ventricular systolic function is normal. The right ventricular size is normal.  3. The mitral valve is normal in structure. No evidence of mitral valve regurgitation.  4. The aortic valve is grossly normal. Aortic valve regurgitation is not visualized.  5. The inferior vena cava is dilated in size with <50% respiratory variability, suggesting right atrial pressure of 15 mmHg. FINDINGS  Left Ventricle: Left ventricular ejection fraction, by estimation, is 60 to 65%. The left ventricle has normal function. The left ventricle has no regional wall motion abnormalities. The left ventricular internal cavity size was normal  in size. There is  mild left ventricular hypertrophy. Left ventricular diastolic parameters are indeterminate. Right Ventricle: The right ventricular size is normal. Right ventricular systolic function is normal. Left Atrium: Left atrial size was normal in size. Right Atrium: Right atrial size was normal in size. Pericardium: Trivial pericardial effusion is present. Mitral Valve: The mitral valve is normal in structure. No  evidence of mitral valve regurgitation. Tricuspid Valve: Tricuspid valve regurgitation is not demonstrated. Aortic Valve: The aortic valve is grossly normal. Aortic valve regurgitation is not visualized. Pulmonic Valve: Pulmonic valve regurgitation is not visualized. Aorta: The aortic root and ascending aorta are structurally normal, with no evidence of dilitation. Venous: The inferior vena cava is dilated in size with less than 50% respiratory variability, suggesting right atrial pressure of 15 mmHg. IAS/Shunts: No atrial level shunt detected by color flow Doppler.  LEFT VENTRICLE PLAX 2D LVIDd:         4.40 cm      Diastology LVIDs:         3.00 cm      LV e' medial:    6.09 cm/s LV PW:         1.10 cm      LV E/e' medial:  11.1 LV IVS:        1.20 cm      LV e' lateral:   7.40 cm/s LVOT diam:     2.40 cm      LV E/e' lateral: 9.1 LV SV:         91 LV SV Index:   40 LVOT Area:     4.52 cm  LV Volumes (MOD) LV vol d, MOD A2C: 120.0 ml LV vol d, MOD A4C: 77.2 ml LV vol s, MOD A2C: 31.8 ml LV vol s, MOD A4C: 25.9 ml LV SV MOD A2C:     88.2 ml LV SV MOD A4C:     77.2 ml LV SV MOD BP:      70.6 ml RIGHT VENTRICLE             IVC RV S prime:     15.20 cm/s  IVC diam: 3.30 cm TAPSE (M-mode): 1.9 cm LEFT ATRIUM           Index        RIGHT ATRIUM           Index LA diam:      4.20 cm 1.85 cm/m   RA Area:     15.50 cm LA Vol (A2C): 43.5 ml 19.13 ml/m  RA Volume:   33.60 ml  14.78 ml/m LA Vol (A4C): 37.7 ml 16.58 ml/m  AORTIC VALVE LVOT Vmax:   127.00 cm/s LVOT Vmean:  77.700 cm/s LVOT VTI:    0.201 m  AORTA Ao Root diam: 3.70 cm Ao Asc diam:  3.70 cm MITRAL VALVE MV Area (PHT): 3.88 cm    SHUNTS MV Decel Time: 196 msec    Systemic VTI:  0.20 m MV E velocity: 67.55 cm/s  Systemic Diam: 2.40 cm MV A velocity: 73.85 cm/s MV E/A ratio:  0.91 [place] signed by Carolan Clines Signature Date/Time: 05/04/2023/4:03:39 PM    Final    CT Angio Chest Pulmonary Embolism (PE) W or WO Contrast  Result Date:  05/04/2023 CLINICAL DATA:  82 year old male with evidence of multilobar pneumonia on chest radiographs yesterday. Sepsis. Possible PE. EXAM: CT ANGIOGRAPHY CHEST WITH CONTRAST TECHNIQUE: Multidetector CT imaging of the chest was performed using the standard protocol  during bolus administration of intravenous contrast. Multiplanar CT image reconstructions and MIPs were obtained to evaluate the vascular anatomy. RADIATION DOSE REDUCTION: This exam was performed according to the departmental dose-optimization program which includes automated exposure control, adjustment of the mA and/or kV according to patient size and/or use of iterative reconstruction technique. CONTRAST:  OMNIPAQUE IOHEXOL 350 MG/ML SOLN COMPARISON:  Chest radiographs yesterday. Prior chest CT 03/04/2023. FINDINGS: Cardiovascular: Adequate contrast bolus timing in the pulmonary arterial tree. No pulmonary artery filling defect. Some central pulmonary artery enlargement (series 5, image 123) appears chronic. Calcified aortic atherosclerosis. Left vertebral artery arises directly from the aortic arch, normal variant. Mild to moderate cardiomegaly. Pericardial thickening and calcifications, chronic (series 5, image 192). No acute pericardial effusion. Calcified coronary artery atherosclerosis (series 5, image 141). Mediastinum/Nodes: Small but reactive appearing right hilar and mediastinal lymph nodes appear increased compared to April. No mediastinal mass. Lungs/Pleura: Trace layering pleural effusions are new since April. Mildly lower lung volumes. Right lung acute rounded, confluent areas of peribronchial sub solid opacity with early consolidation in all lobes. Major airways remain patent. Superimposed dependent atelectasis bilaterally. No definite acute inflammation in the left lung at this time. Upper Abdomen: Cholecystectomy. Negative visible liver, spleen, stomach and adrenal glands. No free air or free fluid in the upper abdomen.  Musculoskeletal: Stable.  No acute osseous abnormality identified. Review of the MIP images confirms the above findings. IMPRESSION: 1. Negative for acute pulmonary embolus. Chronic pulmonary artery enlargement suggesting a degree of pulmonary artery hypertension. 2. Acute Right Lung Pneumonia affecting all lobes. Multifocal bronchopneumonia pattern, consider viral or atypical respiratory infection. Trace pleural effusions and small reactive mediastinal lymph nodes. 3. 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 4. Coronary artery and aortic Atherosclerosis (ICD10-I70.0). Electronically Signed   By: Odessa Fleming M.D.   On: 05/04/2023 04:24     Scheduled Meds:  dextromethorphan-guaiFENesin  1 tablet Oral BID   escitalopram  10 mg Oral Daily   insulin aspart  0-5 Units Subcutaneous QHS   insulin aspart  0-6 Units Subcutaneous TID WC   ipratropium-albuterol  3 mL Nebulization Q6H   pantoprazole  40 mg Oral Daily   rosuvastatin  10 mg Oral Daily   tamsulosin  0.4 mg Oral QPC supper   Warfarin - Pharmacist Dosing Inpatient   Does not apply q1600   Continuous Infusions:  azithromycin 500 mg (05/05/23 0856)   cefTRIAXone (ROCEPHIN)  IV 2 g (05/05/23 1032)     LOS: 2 days    Shon Hale M.D on 05/05/2023 at 6:41 PM  Go to [email or web address] - for contact info  [facility] - Office  [phone]  If 7PM-7AM, please contact night-coverage [email or web address] 05/05/2023, 6:41 PM

## 2023-05-06 LAB — RENAL FUNCTION PANEL
Albumin: 3.1 g/dL — ABNORMAL LOW (ref 3.5–5.0)
Anion gap: 8 (ref 5–15)
BUN: 14 mg/dL (ref 8–23)
CO2: 24 mmol/L (ref 22–32)
Calcium: 8.5 mg/dL — ABNORMAL LOW (ref 8.9–10.3)
Chloride: 106 mmol/L (ref 98–111)
Creatinine, Ser: 1.13 mg/dL (ref 0.61–1.24)
GFR, Estimated: >60 mL/min (ref >60)
Glucose, Bld: 119 mg/dL — ABNORMAL HIGH (ref 70–99)
Phosphorus: 3 mg/dL (ref 2.5–4.6)
Potassium: 4 mmol/L (ref 3.5–5.1)
Sodium: 138 mmol/L (ref 135–145)

## 2023-05-06 LAB — CBC
HCT: 34.2 % — ABNORMAL LOW (ref 39.0–52.0)
Hemoglobin: 11.1 g/dL — ABNORMAL LOW (ref 13.0–17.0)
MCH: 31.3 pg (ref 26.0–34.0)
MCHC: 32.5 g/dL (ref 30.0–36.0)
MCV: 96.3 fL (ref 80.0–100.0)
Platelets: 131 10*3/uL — ABNORMAL LOW (ref 150–400)
RBC: 3.55 MIL/uL — ABNORMAL LOW (ref 4.22–5.81)
RDW: 14.3 % (ref 11.5–15.5)
WBC: 8.9 10*3/uL (ref 4.0–10.5)
nRBC: 0 % (ref 0.0–0.2)

## 2023-05-06 LAB — GLUCOSE, CAPILLARY
Glucose-Capillary: 129 mg/dL — ABNORMAL HIGH (ref 70–99)
Glucose-Capillary: 174 mg/dL — ABNORMAL HIGH (ref 70–99)

## 2023-05-06 LAB — PROTIME-INR
INR: 2.2 — ABNORMAL HIGH (ref 0.8–1.2)
Prothrombin Time: 24.6 seconds — ABNORMAL HIGH (ref 11.4–15.2)

## 2023-05-06 LAB — CULTURE, BLOOD (ROUTINE X 2): Culture: NO GROWTH

## 2023-05-06 MED ORDER — ALBUTEROL SULFATE HFA 108 (90 BASE) MCG/ACT IN AERS: 2.0000 | INHALATION_SPRAY | Freq: Four times a day (QID) | RESPIRATORY_TRACT | 2 refills | Status: DC | PRN

## 2023-05-06 MED ORDER — DOXYCYCLINE HYCLATE 100 MG PO TABS: 100.0000 mg | ORAL_TABLET | Freq: Two times a day (BID) | ORAL | 0 refills | Status: AC

## 2023-05-06 MED ORDER — ONDANSETRON HCL 4 MG PO TABS: 4.0000 mg | ORAL_TABLET | Freq: Two times a day (BID) | ORAL | 0 refills | Status: DC | PRN

## 2023-05-06 MED ORDER — CEFDINIR 300 MG PO CAPS: 300.0000 mg | ORAL_CAPSULE | Freq: Two times a day (BID) | ORAL | 0 refills | Status: AC

## 2023-05-06 MED ORDER — DM-GUAIFENESIN ER 30-600 MG PO TB12: 1.0000 | ORAL_TABLET | Freq: Two times a day (BID) | ORAL | 0 refills | Status: DC

## 2023-05-06 MED ORDER — WARFARIN SODIUM 5 MG PO TABS
5.0000 mg | ORAL_TABLET | Freq: Once | ORAL | Status: AC
  Administered 2023-05-06: 5 mg via ORAL
  Filled 2023-05-06: qty 1

## 2023-05-06 NOTE — Progress Notes (Signed)
Patient rested through the night, no overnight events.

## 2023-05-06 NOTE — Progress Notes (Signed)
Patient discharged home with instructions given on medications and follow up visits,patient verbalized understanding. Prescriptions sent to Pharmacy of choice documented on AVS. IV discontinued, catheter intact. Accompanied by staff to an awaiting vehicle. 

## 2023-05-06 NOTE — Progress Notes (Signed)
ANTICOAGULATION CONSULT NOTE   Pharmacy Consult for Warfarin Indication: atrial fibrillation  Allergies  Allergen Reactions   Prednisone     Patient state that this medication made his blood sugar go real high    Patient Measurements: Height: 6\' 2"  (188 cm) Weight: 100.9 kg (222 lb 7.1 oz) IBW/kg (Calculated) : 82.2  Vital Signs: Temp: 98.9 F (37.2 C) (06/17 0421) Temp Source: Oral (06/17 0421) BP: 112/64 (06/17 0421) Pulse Rate: 80 (06/17 0421)  Labs: Recent Labs    05/04/23 0259 05/04/23 0442 05/05/23 0424 05/05/23 0639 05/06/23 0430  HGB  --   --   --  9.9* 11.1*  HCT  --   --   --  30.1* 34.2*  PLT  --   --   --  109* 131*  LABPROT 24.8*  --  27.2*  --  24.6*  INR 2.2*  --  2.5*  --  2.2*  CREATININE  --   --  0.97  --  1.13  TROPONINIHS 42* 36*  --   --   --     Estimated Creatinine Clearance: 65 mL/min (by C-G formula based on SCr of 1.13 mg/dL).  Assessment: 82 year old male hx Afib, recently treated with doxy for bronchitis now admitted with SOB/fevers/sepsis. Abx initiated and pharmacy consulted to resume warfarin therapy. INR remains at goal this morning at 2.2.Hgb trending back up to 11.1, no bleeding issues noted.   Patient is followed by the coumadin clinic. Home regimen 5mg  daily except 7.5mg  Tue and Fri.   Goal of Therapy:  INR 2-3 Monitor platelets by anticoagulation protocol: Yes   Plan:  Warfarin 5 mg po x1 INR daily Monitor s/sx bleeding  Sheppard Coil PharmD., BCPS Clinical Pharmacist 05/06/2023 7:45 AM

## 2023-05-06 NOTE — TOC Transition Note (Signed)
Transition of Care [facility]) - CM/SW Discharge Note   Patient Details  Name: [name] MRN: [id number] Date of Birth: 06-19-1941  [facility] of [facility] CM/SW Contact:  Elliot Gault, LCSW Phone Number: 05/06/2023, 3:43 PM   Clinical Narrative:     Received update from MD that he was ordering [facility] RN and PT for dc. Spoke with pt/son about CMS provider options and referred to [facility] as requested. They will follow up to schedule in home visits.  No other TOC needs for dc.   Final next level of care: Home w Home Health Services Barriers to Discharge: Barriers Resolved   Patient Goals and CMS Choice CMS Medicare.gov Compare Post Acute Care list provided to:: Patient Represenative (must comment) Choice offered to / list presented to : Adult Children  Discharge Placement                         Discharge Plan and Services Additional resources added to the After Visit Summary for                            [facility] Arranged: PT, RN [facility] Agency: [facility] Date [facility] Agency Contacted: 05/06/23   Representative spoke with at [facility] Agency: Morrie Sheldon  Social Determinants of Health (SDOH) Interventions SDOH Screenings   Food Insecurity: No Food Insecurity (05/03/2023)  Housing: Low Risk  (05/03/2023)  Transportation Needs: No Transportation Needs (05/03/2023)  Utilities: Not At Risk (05/03/2023)  Tobacco Use: Medium Risk (05/03/2023)     Readmission Risk Interventions     No data to display

## 2023-05-06 NOTE — Progress Notes (Signed)
SATURATION QUALIFICATIONS: (This note is used to comply with regulatory documentation for home oxygen)  Patient Saturations on Room Air at Rest = 96%  Patient Saturations on Room Air while Ambulating = 94%  Patient Saturations on 0 Liters of oxygen while Ambulating = 94%  Please briefly explain why patient needs home oxygen: 

## 2023-05-06 NOTE — Discharge Summary (Signed)
[name], is a 82 y.o. male  DOB 1941-02-28  MRN [id number].  Admission date:  05/03/2023  Admitting Physician  Tuwanna Krausz Mariea Clonts, MD  Discharge Date:  05/06/2023   Primary MD  Elfredia Nevins, MD  Recommendations for primary care physician for things to follow:  1)Follow up PCP in 1 to 2 weeks for recheck 2)Repeat chest x-ray in 4 to 6  weeks advised to ensure resolution of right-sided pneumonia and exclude underlying abnormalities 3)Repeat CBC, BMP and PT/INR blood test on Friday, 05/10/2023 advised 4)Follow - up with Dr.Wert--- Pulmonologist in [place]-  -Address: [address], [place], [place] [address] Phone: [phone] OR call [phone] -Patient needs to be seen in the [place] office, Not in [facility] 5)Continue Incentive Spiromentry---as advised--  Admission Diagnosis  Pneumonia [J18.9] Severe sepsis (HCC) [A41.9, R65.20] Community acquired pneumonia of right lower lobe of lung [J18.9] Acute hypoxic respiratory failure (HCC) [J96.01]  Discharge Diagnosis  Pneumonia [J18.9] Severe sepsis (HCC) [A41.9, R65.20] Community acquired pneumonia of right lower lobe of lung [J18.9] Acute hypoxic respiratory failure (HCC) [J96.01]    Principal Problem:   Sepsis due to pneumonia [facility]) Active Problems:   Pneumonia   GERD   Atrial fibrillation (HCC)   Polyneuropathy due to type 2 diabetes mellitus (HCC)     Past Medical History:  Diagnosis Date   Aortic atherosclerosis (HCC)    Barrett's esophagus    Short-segment, diagnosed in 2007 by Dr. Jena Gauss   BPH (benign prostatic hyperplasia)    CAD (coronary artery disease)    a. cath on 07/21/2019 showing patent LM and LCx with 40-60% Proximal LAD and 30-40% Pro-RCA stenosis with normal LV function.   Essential hypertension    GERD (gastroesophageal reflux disease)    Hiatal hernia    History of kidney stones    PAF (paroxysmal atrial  fibrillation) (HCC)    S/P colonoscopy 2007   S/P endoscopy 08/2010    Salmon-colored epithelium coming up to 37 cm from the    Type 2 diabetes mellitus (HCC)    Vertigo     Past Surgical History:  Procedure Laterality Date   APPENDECTOMY     BIOPSY  01/28/2020   Procedure: BIOPSY;  Surgeon: Corbin Ade, MD;  Location[facility];  Service: Endoscopy;;  esophagus   CHOLECYSTECTOMY     COLONOSCOPY  02/06/2006   Dr. Jena Gauss- single anal papilla, o/w normal rectum, normal colon   COLONOSCOPY N/A 09/13/2016   RMR: three 5-6 mm for adenomas removed.  Recommend one last surveillance colonoscopy in October 2020.   COLONOSCOPY WITH PROPOFOL N/A 01/28/2020   Procedure: COLONOSCOPY WITH PROPOFOL;  Surgeon: Corbin Ade, MD;  Location: [facility];  Service: Endoscopy;  Laterality: N/A;  10:45am   CORONARY PRESSURE/FFR STUDY N/A 07/21/2019   Procedure: INTRAVASCULAR PRESSURE WIRE/FFR STUDY;  Surgeon: Lyn Records, MD;  Location: [facility];  Service: Cardiovascular;  Laterality: N/A;   CYSTOSCOPY WITH RETROGRADE PYELOGRAM, URETEROSCOPY AND STENT PLACEMENT Right 08/18/2020   Procedure: CYSTOSCOPY WITH INCISION  OF RIGHT URETEROCELE, URETEROSCOPY WITH HOLMIUM LASER, CYSTOLITHOLAPAXY OF 10 MM BLADDER STONE;  Surgeon: Bjorn Pippin, MD;  Location: [facility];  Service: Urology;  Laterality: Right;   ESOPHAGOGASTRODUODENOSCOPY  09/15/2010   Dr. Jena Gauss- Salmon-colored epithelium coming up to 37 cm from the    ESOPHAGOGASTRODUODENOSCOPY N/A 07/02/2013   Dr. Jena Gauss- Barretts on bx. hiatal hernia   ESOPHAGOGASTRODUODENOSCOPY N/A 09/13/2016   RMR: Barrett's esophagus without dysplasia. one last EGD in 3 years.    ESOPHAGOGASTRODUODENOSCOPY (EGD) WITH PROPOFOL N/A 01/28/2020   Procedure: ESOPHAGOGASTRODUODENOSCOPY (EGD) WITH PROPOFOL;  Surgeon: Corbin Ade, MD;  [facility];  Service: Endoscopy;  Laterality: N/A;   LEFT HEART CATH AND CORONARY ANGIOGRAPHY N/A 07/21/2019   Procedure: LEFT  HEART CATH AND CORONARY ANGIOGRAPHY;  Surgeon: Lyn Records, MD;  [facility];  Service: Cardiovascular;  Laterality: N/A;   POLYPECTOMY  01/28/2020   Procedure: POLYPECTOMY;  Surgeon: Corbin Ade, MD;  [facility];  Service: Endoscopy;;  colon   ROTATOR CUFF REPAIR     YAG LASER APPLICATION Right 12/20/2014   Procedure: YAG LASER APPLICATION;  Surgeon: Susa Simmonds, MD;  Location: [facility];  Service: Ophthalmology;  Laterality: Right;     HPI  from the history and physical done on the day of admission:   [name]  is a 82 y.o. male with past medical history relevant for PAFib on Coumadin, restless leg syndrome, interstitial lung disease, DM2, HTN, BPH and GERD who presents to the ED with complaints of sleepiness -Most of the history is being obtained from patient's brother [name] at bedside as patient is sleepy -Patient wakes up easily to answer questions and then falls back asleep --Temp 102.8, tachycardia up to 104 tachypneic up to 30 - Apparently patient was diagnosed with COVID respiratory infection back in November 2023 and has had on and off medical issues especially respiratory issues since then -Has been treated recently with antibiotics and steroids -Patient apparently has been confused and disoriented on and off for the last 24 hours or so   No Nausea, Vomiting or Diarrhea -Chest x-ray with acute multilobar right lung pneumonia -COVID, flu and RSV negative -CBC WNL Lactic acid 2.7 improved to 1.9 with IV fluids -UA not suggestive of UTI VBG mostly reassuring-- -Creatinine --1.34---baseline 0.9 to 1.0, LFTs are not elevated INR ---1.8    Hospital Course:     Assessment and Plan: Brief Narrative:   82 y.o. male with past medical history relevant for PAFib on Coumadin, restless leg syndrome, interstitial lung disease, DM2, HTN, BPH and GERD admitted on 05/03/23 with Sepsis secondary to right-sided pneumonia leading to acute metabolic  encephalopathy, AKI and acute hypoxic respiratory failure     -Assessment and Plan: 1)Sepsis secondary to right-sided pneumonia--POA-- -patient met sepsis criteria on admission, also had organ dysfunction including acute hypoxic respiratory failure, AKI and acute metabolic encephalopathy -history of underlying interstitial lung disease -Chest x-ray and CTA chest consistent with extensive right-sided pneumonia in all lobes, no PE -COVID, flu and RSV negative Lactic acid 2.7 improved to 1.9 with IV fluids he was treated with IV Rocephin/azithromycin  -Patient received mucolytic's and bronchodilators- -sepsis pathophysiology resolved -Okay to discharge on Omnicef and Doxy -Outpatient chest x-ray in 4 to 6 weeks to exclude underlying malignancy advised   2)Post-Tussive Chest Pains-----suspect chest pains are related to right-sided pneumonia with persistent cough -EKG sinus rhythm without acute ACS type findings Troponin 42 >> 36 CTA chest without PE,  right-sided pneumonia noted as above #1 -CTA chest also showed chronic pericardial calcification, 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---ESR is only 23, CRP 14 , on echo only trivial pericardial effusion is noted   3)AKI----acute kidney injury  --Creatinine --1.34---baseline 0.9 to 1.0,  -With hydration creatinine is down to 1.1 --renally adjust medications, avoid nephrotoxic agents / dehydration  / hypotension   4)PAFib--- INR ---therapeutic -Currently in sinus rhythm -Echo with EF of 60 to 65% with mild LVH, no regional wall motion abnormalities, mitral and aortic valve appears to be normal,  on echo only trivial pericardial effusion is noted in -Continue Coumadin for stroke prophylaxis   5) acute metabolic encephalopathy--- due to #1 #3 above -As per patient's older brother [name] was at bedside patient's mentation is back to baseline--   6)Generalized weakness/deconditioning and ambulatory dysfunction-- -family  reports that patient was diagnosed with COVID respiratory infection back in November 2023 and has had on and off medical issues especially respiratory issues since then -Has been treated recently with antibiotics and steroids -Outpatient follow-up with neurologist advised -Discharge home with home health physical therapy   7)DM2- A1c 7.9 reflecting uncontrolled DM with hyperglycemia PTA -Resume semaglutide , metformin , Actos and glipizide Use Novolog/Humalog Sliding scale insulin with Accu-Cheks/Fingersticks as ordered    8)BPH--continue Flomax   9) anxiety disorder--resume Lexapro and Xanax  10)HLD--c/n Crestor   11)GERD--- continue Protonix   12) acute hypoxic respiratory failure--- due to pneumonia as above #1 -Hypoxia resolved completely, O2 sats on room air post ambulation is 94 to 96%   13) hypokalemia--replaced   14) anemia and  thrombocytopenia--- -Hgb down to 9.9 from 14.1 on admission suspect some hemodilution effect -Patient hemoglobin patient between 12 and 13 -Discharge hemoglobin is up to 11.1 -Stool occult blood is negative -Platelets are down to 109 from 178--suspect related to acute infection = Discharge platelets up to 131k -INR therapeutic    Disposition: The patient is from: Home              Anticipated d/c is to: Home with [facility]   Discharge Condition: stable  Follow UP   Follow-up Information     Elfredia Nevins, MD. Schedule an appointment as soon as possible for a visit in 1 week(s).   Specialty: Internal Medicine Contact information: [address] [place] [address] [phone]         Nyoka Cowden, MD. Schedule an appointment as soon as possible for a visit in 1 month(s).   Specialty: Pulmonary Disease Contact information: [address] [place] [address] [phone]                 Diet and Activity recommendation:  As advised  Discharge Instructions     Discharge Instructions     Ambulatory referral to  Pulmonology   Complete by: As directed    Reason for referral: Cough   Call MD for:  difficulty breathing, headache or visual disturbances   Complete by: As directed    Call MD for:  persistant dizziness or light-headedness   Complete by: As directed    Call MD for:  persistant nausea and vomiting   Complete by: As directed    Call MD for:  temperature >100.4   Complete by: As directed    Diet - low sodium heart healthy   Complete by: As directed    Discharge instructions   Complete by: As directed    1)Follow up PCP in 1 to 2  weeks for recheck 2)Repeat chest x-ray in 4 to 6  weeks advised to ensure resolution of right-sided pneumonia and exclude underlying abnormalities 3)Repeat CBC, BMP and PT/INR blood test on Friday, 05/10/2023 advised 4)Follow - up with Dr.Wert--- Pulmonologist in [place]-  -Address: [address] [place], [place] [address] Phone: [phone] OR call [phone] -Patient needs to be seen in the [place] office, Not in [facility] 5)Continue Incentive Spiromentry---as advised--   Increase activity slowly   Complete by: As directed         Discharge Medications     Allergies as of 05/06/2023       Reactions   Prednisone    Patient state that this medication made his blood sugar go real high        Medication List     STOP taking these medications    doxycycline 100 MG capsule Commonly known as: VIBRAMYCIN Replaced by: doxycycline 100 MG tablet   escitalopram 10 MG tablet Commonly known as: LEXAPRO   Hydrocod Poli-Chlorphe Poli ER 10-8 MG Cp12   methylPREDNISolone 4 MG Tbpk tablet Commonly known as: MEDROL DOSEPAK   sildenafil 50 MG tablet Commonly known as: VIAGRA   tadalafil 20 MG tablet Commonly known as: CIALIS       TAKE these medications    Accu-Chek FastClix Lancets Misc 1 application by Does not apply route 2 (two) times daily.   albuterol 108 (90 Base) MCG/ACT inhaler Commonly known as: VENTOLIN HFA Inhale 2  puffs into the lungs every 6 (six) hours as needed for wheezing or shortness of breath.   allopurinol 100 MG tablet Commonly known as: ZYLOPRIM Take 100 mg by mouth daily.   ALPRAZolam 1 MG tablet Commonly known as: XANAX Take 0.5 mg by mouth at bedtime as needed for anxiety or sleep.   cefdinir 300 MG capsule Commonly known as: OMNICEF Take 1 capsule (300 mg total) by mouth 2 (two) times daily for 5 days.   dextromethorphan-guaiFENesin 30-600 MG 12hr tablet Commonly known as: MUCINEX DM Take 1 tablet by mouth 2 (two) times daily.   doxycycline 100 MG tablet Commonly known as: VIBRA-TABS Take 1 tablet (100 mg total) by mouth 2 (two) times daily for 5 days. Replaces: doxycycline 100 MG capsule   DULoxetine 60 MG capsule Commonly known as: CYMBALTA Take 60 mg by mouth daily.   fexofenadine 180 MG tablet Commonly known as: ALLEGRA Take 180 mg by mouth daily.   finasteride 5 MG tablet Commonly known as: PROSCAR TAKE ONE TABLET (5MG  TOTAL) BY MOUTH DAILY What changed: See the new instructions.   gabapentin 300 MG capsule Commonly known as: NEURONTIN Take 300 mg by mouth 2 (two) times daily.   glipiZIDE 5 MG 24 hr tablet Commonly known as: GLUCOTROL XL TAKE ONE TABLET (5MG  TOTAL) BY MOUTH DAILY WITH BREAKFAST What changed: See the new instructions.   glucose blood test strip 1 each by Other route daily before breakfast. Use as instructed   metFORMIN 500 MG 24 hr tablet Commonly known as: GLUCOPHAGE-XR Take 1 tablet (500 mg total) by mouth 2 (two) times daily with a meal.   ondansetron 4 MG tablet Commonly known as: Zofran Take 1 tablet (4 mg total) by mouth every 12 (twelve) hours as needed for nausea or vomiting.   pantoprazole 40 MG tablet Commonly known as: PROTONIX TAKE ONE TABLET (40MG  TOTAL) BY MOUTH TWO TIMES DAILY BEFORE A MEAL What changed: See the new instructions.   pioglitazone 30 MG tablet Commonly known as: ACTOS  TAKE ONE TABLET (30MG  TOTAL) BY  MOUTH DAILY What changed: See the new instructions.   rosuvastatin 10 MG tablet Commonly known as: CRESTOR TAKE ONE TABLET (10MG  TOTAL) BY MOUTH DAILY What changed: See the new instructions.   Rybelsus 3 MG Tabs Generic drug: Semaglutide Take 1 tablet (3 mg total) by mouth daily.   tamsulosin 0.4 MG Caps capsule Commonly known as: FLOMAX TAKE ONE CAPSULE (0.4MG  TOTAL) BY MOUTH TWO TIMES DAILY What changed: See the new instructions.   traMADol 50 MG tablet Commonly known as: ULTRAM Take 1 tablet (50 mg total) by mouth every 6 (six) hours as needed for moderate pain (pain.).   Vitamin D3 50 MCG (2000 UT) Tabs Take 2,000 Units by mouth daily.   warfarin 5 MG tablet Commonly known as: COUMADIN Take as directed. If you are unsure how to take this medication, talk to your nurse or doctor. Original instructions: TAKE ONE TABLET BY MOUTH DAILY EXCEPT TAKE ONE AND ONE-HALF TABLETS ON TUESDAYS AND FRIDAYS OR AS DIRECTED What changed:  how much to take how to take this when to take this       Major procedures and Radiology Reports - PLEASE review detailed and final reports for all details, in brief -  ECHOCARDIOGRAM COMPLETE  Result Date: 05/04/2023    ECHOCARDIOGRAM REPORT   Patient Name:   TREQUAN WALMSLEY Date of Exam: 05/04/2023 Medical Rec #:  [id number]      Height:       74.0 in Accession #:    [id number]     Weight:       222.4 lb Date of Birth:  09-05-1941      BSA:          2.274 m Patient Age:    82 years       BP:           121/54 mmHg Patient Gender: M              HR:           83 bpm. [facility] Procedure: 2D Echo, Cardiac Doppler and Color Doppler Indications:    R06.02 SOB  History:        Patient has prior history of Echocardiogram examinations, most                 recent 04/19/2020. Abnormal ECG, Arrythmias:LBBB, Atrial                 Fibrillation and Atrial Flutter,                 Signs/Symptoms:Dizziness/Lightheadedness, Dyspnea and Shortness                  of Breath; Risk Factors:Dyslipidemia, Former Smoker and                 Hypertension. Pneumonia.  Sonographer:    Sheralyn Boatman RDCS Referring Phys: [id number] Gaytha Raybourn IMPRESSIONS  1. Left ventricular ejection fraction, by estimation, is 60 to 65%. The left ventricle has normal function. The left ventricle has no regional wall motion abnormalities. There is mild left ventricular hypertrophy. Left ventricular diastolic parameters are indeterminate.  2. Right ventricular systolic function is normal. The right ventricular size is normal.  3. The mitral valve is normal in structure. No evidence of mitral valve regurgitation.  4. The aortic valve is grossly normal. Aortic valve regurgitation is not visualized.  5. The inferior vena cava is dilated in  size with <50% respiratory variability, suggesting right atrial pressure of 15 mmHg. FINDINGS  Left Ventricle: Left ventricular ejection fraction, by estimation, is 60 to 65%. The left ventricle has normal function. The left ventricle has no regional wall motion abnormalities. The left ventricular internal cavity size was normal in size. There is  mild left ventricular hypertrophy. Left ventricular diastolic parameters are indeterminate. Right Ventricle: The right ventricular size is normal. Right ventricular systolic function is normal. Left Atrium: Left atrial size was normal in size. Right Atrium: Right atrial size was normal in size. Pericardium: Trivial pericardial effusion is present. Mitral Valve: The mitral valve is normal in structure. No evidence of mitral valve regurgitation. Tricuspid Valve: Tricuspid valve regurgitation is not demonstrated. Aortic Valve: The aortic valve is grossly normal. Aortic valve regurgitation is not visualized. Pulmonic Valve: Pulmonic valve regurgitation is not visualized. Aorta: The aortic root and ascending aorta are structurally normal, with no evidence of dilitation. Venous: The inferior vena cava is dilated in size with less than  50% respiratory variability, suggesting right atrial pressure of 15 mmHg. IAS/Shunts: No atrial level shunt detected by color flow Doppler.  LEFT VENTRICLE PLAX 2D LVIDd:         4.40 cm      Diastology LVIDs:         3.00 cm      LV e' medial:    6.09 cm/s LV PW:         1.10 cm      LV E/e' medial:  11.1 LV IVS:        1.20 cm      LV e' lateral:   7.40 cm/s LVOT diam:     2.40 cm      LV E/e' lateral: 9.1 LV SV:         91 LV SV Index:   40 LVOT Area:     4.52 cm  LV Volumes (MOD) LV vol d, MOD A2C: 120.0 ml LV vol d, MOD A4C: 77.2 ml LV vol s, MOD A2C: 31.8 ml LV vol s, MOD A4C: 25.9 ml LV SV MOD A2C:     88.2 ml LV SV MOD A4C:     77.2 ml LV SV MOD BP:      70.6 ml RIGHT VENTRICLE             IVC RV S prime:     15.20 cm/s  IVC diam: 3.30 cm TAPSE (M-mode): 1.9 cm LEFT ATRIUM           Index        RIGHT ATRIUM           Index LA diam:      4.20 cm 1.85 cm/m   RA Area:     15.50 cm LA Vol (A2C): 43.5 ml 19.13 ml/m  RA Volume:   33.60 ml  14.78 ml/m LA Vol (A4C): 37.7 ml 16.58 ml/m  AORTIC VALVE LVOT Vmax:   127.00 cm/s LVOT Vmean:  77.700 cm/s LVOT VTI:    0.201 m  AORTA Ao Root diam: 3.70 cm Ao Asc diam:  3.70 cm MITRAL VALVE MV Area (PHT): 3.88 cm    SHUNTS MV Decel Time: 196 msec    Systemic VTI:  0.20 m MV E velocity: 67.55 cm/s  Systemic Diam: 2.40 cm MV A velocity: 73.85 cm/s MV E/A ratio:  0.91 [place] signed by Carolan Clines Signature Date/Time: 05/04/2023/4:03:39 PM    Final    CT Angio  Chest Pulmonary Embolism (PE) W or WO Contrast  Result Date: 05/04/2023 CLINICAL DATA:  82 year old male with evidence of multilobar pneumonia on chest radiographs yesterday. Sepsis. Possible PE. EXAM: CT ANGIOGRAPHY CHEST WITH CONTRAST TECHNIQUE: Multidetector CT imaging of the chest was performed using the standard protocol during bolus administration of intravenous contrast. Multiplanar CT image reconstructions and MIPs were obtained to evaluate the vascular anatomy. RADIATION DOSE  REDUCTION: This exam was performed according to the departmental dose-optimization program which includes automated exposure control, adjustment of the mA and/or kV according to patient size and/or use of iterative reconstruction technique. CONTRAST:  OMNIPAQUE IOHEXOL 350 MG/ML SOLN COMPARISON:  Chest radiographs yesterday. Prior chest CT 03/04/2023. FINDINGS: Cardiovascular: Adequate contrast bolus timing in the pulmonary arterial tree. No pulmonary artery filling defect. Some central pulmonary artery enlargement (series 5, image 123) appears chronic. Calcified aortic atherosclerosis. Left vertebral artery arises directly from the aortic arch, normal variant. Mild to moderate cardiomegaly. Pericardial thickening and calcifications, chronic (series 5, image 192). No acute pericardial effusion. Calcified coronary artery atherosclerosis (series 5, image 141). Mediastinum/Nodes: Small but reactive appearing right hilar and mediastinal lymph nodes appear increased compared to April. No mediastinal mass. Lungs/Pleura: Trace layering pleural effusions are new since April. Mildly lower lung volumes. Right lung acute rounded, confluent areas of peribronchial sub solid opacity with early consolidation in all lobes. Major airways remain patent. Superimposed dependent atelectasis bilaterally. No definite acute inflammation in the left lung at this time. Upper Abdomen: Cholecystectomy. Negative visible liver, spleen, stomach and adrenal glands. No free air or free fluid in the upper abdomen. Musculoskeletal: Stable.  No acute osseous abnormality identified. Review of the MIP images confirms the above findings. IMPRESSION: 1. Negative for acute pulmonary embolus. Chronic pulmonary artery enlargement suggesting a degree of pulmonary artery hypertension. 2. Acute Right Lung Pneumonia affecting all lobes. Multifocal bronchopneumonia pattern, consider viral or atypical respiratory infection. Trace pleural effusions and small  reactive mediastinal lymph nodes. 3. 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 4. Coronary artery and aortic Atherosclerosis (ICD10-I70.0). Electronically Signed   By: Odessa Fleming M.D.   On: 05/04/2023 04:24   DG Chest 2 View  Result Date: 05/03/2023 CLINICAL DATA:  82 year old male with suspected sepsis. EXAM: CHEST - 2 VIEW COMPARISON:  Chest CT 03/04/2023 and earlier. FINDINGS: AP and lateral views of the chest at 0717 hours. Multifocal confluent but vague opacity in the right lung is new, basilar predominant and on the lateral view middle and lower lobe involvement is suspected. Stable lung volumes. Stable cardiac size and mediastinal contours. Visualized tracheal air column is within normal limits. No pneumothorax. No pleural effusion. No acute osseous abnormality identified. Paucity of bowel gas. Stable cholecystectomy clips. IMPRESSION: Evidence of Acute Multilobar Right Lung Pneumonia. No pleural effusion. Electronically Signed   By: Odessa Fleming M.D.   On: 05/03/2023 07:35    Micro Results  Recent Results (from the past 240 hour(s))  Culture, blood (Routine x 2)     Status: None (Preliminary result)   Collection Time: 05/03/23  7:00 AM   Specimen: Right Antecubital; Blood  Result Value Ref Range Status   Specimen Description   Final    RIGHT ANTECUBITAL BOTTLES DRAWN AEROBIC AND ANAEROBIC   Special Requests Blood Culture adequate volume  Final   Culture   Final    NO GROWTH 3 DAYS Performed at [facility], [address], [place], [place] [address]    Report Status PENDING  Incomplete  Culture, blood (Routine x 2)     Status: None (Preliminary result)   Collection Time: 05/03/23  7:00 AM   Specimen: BLOOD RIGHT HAND  Result Value Ref Range Status   Specimen Description   Final    BLOOD RIGHT HAND BOTTLES DRAWN AEROBIC AND ANAEROBIC   Special Requests Blood Culture adequate volume  Final   Culture   Final    NO GROWTH 3 DAYS Performed at  [facility], [address], [place], [place] [address]    Report Status PENDING  Incomplete  Resp panel by RT-PCR (RSV, Flu A&B, Covid) Anterior Nasal Swab     Status: None   Collection Time: 05/03/23  7:27 AM   Specimen: Anterior Nasal Swab  Result Value Ref Range Status   SARS Coronavirus 2 by RT PCR NEGATIVE NEGATIVE Final    Comment: (NOTE) SARS-CoV-2 target nucleic acids are NOT DETECTED.  The SARS-CoV-2 RNA is generally detectable in upper respiratory specimens during the acute phase of infection. The lowest concentration of SARS-CoV-2 viral copies this assay can detect is 138 copies/mL. A negative result does not preclude SARS-Cov-2 infection and should not be used as the sole basis for treatment or other patient management decisions. A negative result may occur with  improper specimen collection/handling, submission of specimen other than nasopharyngeal swab, presence of viral mutation(s) within the areas targeted by this assay, and inadequate number of viral copies(<138 copies/mL). A negative result must be combined with clinical observations, patient history, and epidemiological information. The expected result is Negative.  Fact Sheet for Patients:  BloggerCourse.com  Fact Sheet for Healthcare Providers:  SeriousBroker.it  This test is no t yet approved or cleared by the [place] FDA and  has been authorized for detection and/or diagnosis of SARS-CoV-2 by FDA under an Emergency Use Authorization (EUA). This EUA will remain  in effect (meaning this test can be used) for the duration of the COVID-19 declaration under Section 564(b)(1) of the Act, 21 U.S.C.section 360bbb-3(b)(1), unless the authorization is terminated  or revoked sooner.       Influenza A by PCR NEGATIVE NEGATIVE Final   Influenza B by PCR NEGATIVE NEGATIVE Final    Comment: (NOTE) The Xpert Xpress SARS-CoV-2/FLU/RSV plus assay is intended as an  aid in the diagnosis of influenza from Nasopharyngeal swab specimens and should not be used as a sole basis for treatment. Nasal washings and aspirates are unacceptable for Xpert Xpress SARS-CoV-2/FLU/RSV testing.  Fact Sheet for Patients: BloggerCourse.com  Fact Sheet for Healthcare Providers: SeriousBroker.it  This test is not yet approved or cleared by the [place] FDA and has been authorized for detection and/or diagnosis of SARS-CoV-2 by FDA under an Emergency Use Authorization (EUA). This EUA will remain in effect (meaning this test can be used) for the duration of the COVID-19 declaration under Section 564(b)(1) of the Act, 21 U.S.C. section 360bbb-3(b)(1), unless the authorization is terminated or revoked.     Resp Syncytial Virus by PCR NEGATIVE NEGATIVE Final    Comment: (NOTE) Fact Sheet for Patients: BloggerCourse.com  Fact Sheet for Healthcare Providers: SeriousBroker.it  This test is not yet approved or cleared by the [place] FDA and has been authorized for detection and/or diagnosis of SARS-CoV-2 by FDA under an Emergency Use Authorization (EUA). This EUA will remain in effect (meaning this test can be used) for the duration of the COVID-19 declaration under Section 564(b)(1) of the Act, 21 U.S.C. section 360bbb-3(b)(1), unless the authorization is terminated or revoked.  Performed at [facility], [address], [place], [place] [address]    Today   Subjective    Eilert [place] today has no  - Patient brother [name] at bedside -Ambulated with staff O2 sats 94 to 96% post ambulation -No chest pains no significant dyspnea on exertion          Patient has been seen and examined prior to discharge   Objective   Blood pressure 121/71, pulse 81, temperature 98.5 F (36.9 C), temperature source Oral, resp. rate 18, height 6\' 2"  (1.88 m), weight  100.9 kg, SpO2 93 %.   Intake/Output Summary (Last 24 hours) at 05/06/2023 1540 Last data filed at 05/06/2023 1300 Gross per 24 hour  Intake 720 ml  Output 1150 ml  Net -430 ml    Exam Gen:- Awake Alert, no acute distress  HEENT:- [place].AT, No sclera icterus Neck-Supple Neck,No JVD,.  Lungs-improved air movement, no wheezing CV- S1, S2 normal, regular Abd-  +ve B.Sounds, Abd Soft, No tenderness,    Extremity/Skin:- No  edema,   good pulses Psych-affect is appropriate, oriented x3, no further confusional episodes, patient is coherent and cooperative Neuro-no new focal deficits, no tremors    Data Review   CBC w Diff:  Lab Results  Component Value Date   WBC 8.9 05/06/2023   HGB 11.1 (L) 05/06/2023   HCT 34.2 (L) 05/06/2023   PLT 131 (L) 05/06/2023   LYMPHOPCT 10 05/03/2023   MONOPCT 2 05/03/2023   EOSPCT 1 05/03/2023   BASOPCT 0 05/03/2023    CMP:  Lab Results  Component Value Date   NA 138 05/06/2023   NA 141 11/30/2022   K 4.0 05/06/2023   CL 106 05/06/2023   CO2 24 05/06/2023   BUN 14 05/06/2023   BUN 14 11/30/2022   CREATININE 1.13 05/06/2023   CREATININE 1.16 07/03/2019   PROT 6.1 (L) 05/03/2023   PROT 6.2 11/30/2022   ALBUMIN 3.1 (L) 05/06/2023   ALBUMIN 4.1 11/30/2022   BILITOT 1.2 05/03/2023   BILITOT 0.4 11/30/2022   ALKPHOS 53 05/03/2023   AST 23 05/03/2023   ALT 21 05/03/2023  .  Total Discharge time is about 33 minutes  Shon Hale M.D on 05/06/2023 at 3:40 PM  Go to [email or web address] -  for contact info  [facility] - Office  [phone]

## 2023-05-06 NOTE — Progress Notes (Signed)
PT Cancellation Note  Patient Details Name: [name] MRN: [id number] DOB: June 27, 1941   Cancelled Treatment:    Reason Eval/Treat Not Completed: PT screened, no needs identified, will sign off.  Patient walking independently with nursing staff.   12:04 PM, 05/06/23 Ocie Bob, MPT Physical Therapist with [facility] [phone] office [phone] mobile phone;

## 2023-05-06 NOTE — Plan of Care (Signed)
  Problem: Education: Goal: Ability to describe self-care measures that may prevent or decrease complications (Diabetes Survival Skills Education) will improve Outcome: Progressing   Problem: Coping: Goal: Ability to adjust to condition or change in health will improve Outcome: Progressing   

## 2023-05-06 NOTE — Care Management Important Message (Signed)
Important Message  Patient Details  Name: [name] MRN: [id number] Date of Birth: Aug 03, 1941   Medicare Important Message Given:  Yes     Corey Harold 05/06/2023, 3:02 PM

## 2023-05-06 NOTE — Progress Notes (Signed)
SATURATION QUALIFICATIONS: (This note is used to comply with regulatory documentation for home oxygen)  Patient Saturations on Room Air at Rest = 96%  Patient Saturations on Room Air while Ambulating =  95%  Patient Saturations on 0 Liters of oxygen while Ambulating  95%  Please briefly explain why patient needs home oxygen:  

## 2023-05-06 NOTE — Discharge Instructions (Addendum)
1)Follow up PCP in 1 to 2 weeks for recheck 2)Repeat chest x-ray in 4 to 6  weeks advised to ensure resolution of right-sided pneumonia and exclude underlying abnormalities 3)Repeat CBC, BMP and PT/INR blood test on Friday, 05/10/2023 advised 4)Follow - up with Dr.Wert--- Pulmonologist in [place]-  -Address: [address] [place], [place] [address] Phone: [phone] OR call [phone] -Patient needs to be seen in the [place] office, Not in [facility] 5)Continue Incentive Spiromentry---as advised--

## 2023-05-07 ENCOUNTER — Telehealth: Payer: Self-pay

## 2023-05-07 DIAGNOSIS — K219 Gastro-esophageal reflux disease without esophagitis: Secondary | ICD-10-CM | POA: Diagnosis not present

## 2023-05-07 DIAGNOSIS — J9601 Acute respiratory failure with hypoxia: Secondary | ICD-10-CM | POA: Diagnosis not present

## 2023-05-07 DIAGNOSIS — G2581 Restless legs syndrome: Secondary | ICD-10-CM | POA: Diagnosis not present

## 2023-05-07 DIAGNOSIS — G9341 Metabolic encephalopathy: Secondary | ICD-10-CM | POA: Diagnosis not present

## 2023-05-07 DIAGNOSIS — K2270 Barrett's esophagus without dysplasia: Secondary | ICD-10-CM | POA: Diagnosis not present

## 2023-05-07 DIAGNOSIS — D649 Anemia, unspecified: Secondary | ICD-10-CM | POA: Diagnosis not present

## 2023-05-07 DIAGNOSIS — Z7984 Long term (current) use of oral hypoglycemic drugs: Secondary | ICD-10-CM | POA: Diagnosis not present

## 2023-05-07 DIAGNOSIS — E119 Type 2 diabetes mellitus without complications: Secondary | ICD-10-CM | POA: Diagnosis not present

## 2023-05-07 DIAGNOSIS — J849 Interstitial pulmonary disease, unspecified: Secondary | ICD-10-CM | POA: Diagnosis not present

## 2023-05-07 DIAGNOSIS — A419 Sepsis, unspecified organism: Secondary | ICD-10-CM | POA: Diagnosis not present

## 2023-05-07 DIAGNOSIS — N179 Acute kidney failure, unspecified: Secondary | ICD-10-CM | POA: Diagnosis not present

## 2023-05-07 DIAGNOSIS — J189 Pneumonia, unspecified organism: Secondary | ICD-10-CM | POA: Diagnosis not present

## 2023-05-07 DIAGNOSIS — E785 Hyperlipidemia, unspecified: Secondary | ICD-10-CM | POA: Diagnosis not present

## 2023-05-07 DIAGNOSIS — I480 Paroxysmal atrial fibrillation: Secondary | ICD-10-CM | POA: Diagnosis not present

## 2023-05-07 DIAGNOSIS — D696 Thrombocytopenia, unspecified: Secondary | ICD-10-CM | POA: Diagnosis not present

## 2023-05-07 DIAGNOSIS — E876 Hypokalemia: Secondary | ICD-10-CM | POA: Diagnosis not present

## 2023-05-07 DIAGNOSIS — Z87891 Personal history of nicotine dependence: Secondary | ICD-10-CM | POA: Diagnosis not present

## 2023-05-07 DIAGNOSIS — K449 Diaphragmatic hernia without obstruction or gangrene: Secondary | ICD-10-CM | POA: Diagnosis not present

## 2023-05-07 DIAGNOSIS — Z8616 Personal history of COVID-19: Secondary | ICD-10-CM | POA: Diagnosis not present

## 2023-05-07 DIAGNOSIS — I10 Essential (primary) hypertension: Secondary | ICD-10-CM | POA: Diagnosis not present

## 2023-05-07 DIAGNOSIS — I2510 Atherosclerotic heart disease of native coronary artery without angina pectoris: Secondary | ICD-10-CM | POA: Diagnosis not present

## 2023-05-07 DIAGNOSIS — Z7901 Long term (current) use of anticoagulants: Secondary | ICD-10-CM | POA: Diagnosis not present

## 2023-05-07 DIAGNOSIS — N400 Enlarged prostate without lower urinary tract symptoms: Secondary | ICD-10-CM | POA: Diagnosis not present

## 2023-05-07 DIAGNOSIS — F419 Anxiety disorder, unspecified: Secondary | ICD-10-CM | POA: Diagnosis not present

## 2023-05-07 NOTE — Consult Note (Signed)
[facility] Customer service manager [name]) Accountable Care Organization (ACO) [facility] Liaison Note  05/07/2023  [name] [birth year] [id number]  Location: [facility] RN Hospital Liaison screened the patient remotely at [name].  Insurance: MCR ACO   [name] is a 82 y.o. male who is a Primary Care Patient of Elfredia Nevins, MD. The patient was screened for  readmission hospitalization with noted medium risk score for unplanned readmission risk with 1 IP/1 ED in 6 months.  The patient was assessed for potential [facility]) Care Management service needs for post hospital transition for care coordination. Review of patient's electronic medical record reveals patient ws admitted with Pneumonia. Liaison spoke with pt today and introduced care coordination services and offered a post hospital prevention call (pt receptive). Will make a referral for RN care coordinator to follow up with any additional care management needs.      [facility] Care Management/Population Health does not replace or interfere with any arrangements made by the Inpatient Transition of Care team.   For questions contact:   Elliot Cousin, RN, BSN [facility] Liaison [place]   [facility]  Population Health Office Hours MTWF  8:00 am-6:00 pm Off on Thursday [phone] mobile [phone] [Office toll free line][facility] Office Hours are M-F 8:30 - 5 pm 24 hour nurse advise line [phone] Concierge  [email or web address]

## 2023-05-08 DIAGNOSIS — A419 Sepsis, unspecified organism: Secondary | ICD-10-CM | POA: Diagnosis not present

## 2023-05-08 DIAGNOSIS — I480 Paroxysmal atrial fibrillation: Secondary | ICD-10-CM | POA: Diagnosis not present

## 2023-05-08 DIAGNOSIS — J189 Pneumonia, unspecified organism: Secondary | ICD-10-CM | POA: Diagnosis not present

## 2023-05-08 DIAGNOSIS — J9601 Acute respiratory failure with hypoxia: Secondary | ICD-10-CM | POA: Diagnosis not present

## 2023-05-08 DIAGNOSIS — J849 Interstitial pulmonary disease, unspecified: Secondary | ICD-10-CM | POA: Diagnosis not present

## 2023-05-08 DIAGNOSIS — E119 Type 2 diabetes mellitus without complications: Secondary | ICD-10-CM | POA: Diagnosis not present

## 2023-05-08 LAB — CULTURE, BLOOD (ROUTINE X 2)
Special Requests: ADEQUATE
Special Requests: ADEQUATE

## 2023-05-12 NOTE — Progress Notes (Unsigned)
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  quit smoking in 1994 and some problem with nasal congestion that resolved  On daily allegra  then around 2018/19 doe indolent onset assoc with bilateral neuropathy and chronic back pain  referred to pulmonary clinic in [place]  06/28/2020 by Dr Sherwood Gambler    History of Present Illness  06/28/2020  Pulmonary/ 1st office eval/Yedidya Duddy  Chief Complaint  Patient presents with   Consult    shortness of breath with exertion  Dyspnea:  Uses HC / does ok shopping but has to stop at top of basement steps where didn't previously assoc  feels light headed never supine  Cough: some in am  Sleep: on flat bed/ either side, 2 pillows  SABA use: none Rec We will need to set you with PFT's > not done  Covid p Thxgiving 2023 and cough since > dark initially   then  gradually lighter so by end of March minimal and white and mostly in am.   01/21/2023  Re-establish  ov/Dannon Perlow re: post covid cough   Chief Complaint  Patient presents with   Acute Visit    Pt states had covid 07 Oct 2022 and has had a cough since. He states he gets winded walking up a flight of stairs.Cough occurs in the am- non prod.   Dyspnea:  still stops at top of basement steps/ dizzy too was walking  Cough: minimal  Sleeping:  flat bed/ on side 2 pillows  SABA use:occ saba  02: none  Covid status:   vax x 3  Rec To get the most out of exercise, you need to be continuously aware that you are short of breath, but never out of breath, for at least 20-30 minutes daily.   Make sure you check your oxygen saturations at highest level of activity  Please schedule a follow up office visit in 4 weeks, sooner if needed in [place] Office> did not return  as rec but symptoms subsided back to baseline    Admission date:  05/03/2023    Discharge Date:  05/06/2023   Recommendations for primary care physician for  things to follow:  1)Follow up PCP in 1 to 2 weeks for recheck 2)Repeat chest x-ray in 4 to 6  weeks advised to ensure resolution of right-sided pneumonia and exclude underlying abnormalities 3)Repeat CBC, BMP and PT/INR blood test on Friday, 05/10/2023 advised 4)Follow - up with Dr.Merlin Ege-  5)Continue Incentive Spiromentry---as advised--   Admission Diagnosis  Pneumonia [J18.9] Severe sepsis (HCC) [A41.9, R65.20] Community acquired pneumonia of right lower lobe of lung [J18.9] Acute hypoxic respiratory failure (HCC) [J96.01]   Discharge Diagnosis  Pneumonia [J18.9] Severe sepsis (HCC) [A41.9, R65.20] Community acquired pneumonia of right lower lobe of lung [J18.9] Acute hypoxic respiratory failure (HCC) [J96.01]     Principal Problem:   Sepsis due to pneumonia (HCC)    Pneumonia   GERD   Atrial fibrillation (HCC)   Polyneuropathy due to type 2 diabetes mellitus (HCC)      [name]  is a 82 y.o. male with past medical history relevant for PAFib on Coumadin, restless leg syndrome, interstitial lung disease, DM2, HTN, BPH and GERD who presents to the ED with complaints of sleepiness  Apparently patient was diagnosed with COVID respiratory infection   November 2023 and has had on and off medical issues especially respiratory issues  since then -Has been treated recently with antibiotics and steroids -Patient apparently has been confused and disoriented on and off for the last 24 hours   -Chest x-ray with acute multilobar right lung pneumonia -COVID, flu and RSV negative   -Assessment and Plan: 1)Sepsis secondary to right-sided pneumonia--POA-- -patient met sepsis criteria on admission, also had organ dysfunction including acute hypoxic respiratory failure, AKI and acute metabolic encephalopathy -history of underlying interstitial lung disease -Chest x-ray and CTA chest consistent with extensive right-sided pneumonia in all lobes, no PE -COVID, flu and RSV negative Lactic acid 2.7  improved to 1.9 with IV fluids -Continue IV Rocephin/azithromycin pending culture data --c/n mucolytic's and bronchodilators-   2)Post-Tussive Chest Pains-----suspect chest pains are related to right-sided pneumonia with persistent cough -EKG sinus rhythm without acute ACS type findings Troponin 42 >> 36 CTA chest without PE, right-sided pneumonia noted as above #1 -CTA chest also showed chronic pericardial calcification, 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---ESR is only 23, CRP and echo pending   3)AKI----acute kidney injury  --Creatinine --1.34---baseline 0.9 to 1.0,  -Hydrate IV and orally --renally adjust medications, avoid nephrotoxic agents / dehydration  / hypotension   4)PAFib--- INR ---therapeutic -Currently in sinus rhythm -Continue Coumadin for stroke prophylaxis   5) acute metabolic encephalopathy--- due to #1 #3 above -As per patient's older brother [name] was at bedside patient's mentation has improved, getting close to baseline, --Anticipate further improvement with treatment of #1 #3  above   6)Generalized weakness/deconditioning and ambulatory dysfunction-- -family reports that patient was diagnosed with COVID respiratory infection back in November 2023 and has had on and off medical issues especially respiratory issues since then -Has been treated recently with antibiotics and steroids     >>>  acute hypoxic respiratory failure--- due to pneumonia as above #1 -Oxygen has been weaned down to 3 L   --Was Not on O2 prior to admission> not discharge on 02      05/14/2023  f/u ov/[place] office/Lesley Galentine re: post covid cough x tgiving 2023   resolved to his satisfaction prior to above admit  Chief Complaint  Patient presents with   Hospital Followup     Hospitalized for PNA 05/03/23-05/06/23. Breathing has improved. He c/o fatigue and cough with clear sputum.   Dyspnea:  able to do steps up from basement sev times a day s stopping  Cough: in am's most  notable but also with talking not with waking/ all clear mucus now  Sleeping: flat bed / 2 pillows and does not actually wake him up  SABA use: albuterol not sure helping  02: none   Using lots of Hall's cough drops daytim       No obvious day to day or daytime variability or assoc  purulent sputum or mucus plugs or hemoptysis or cp or chest tightness, subjective wheeze or overt sinus or hb symptoms.   Sleeping  without nocturnal  or early am exacerbation  of respiratory  c/o's or need for noct saba. Also denies any obvious fluctuation of symptoms with weather or environmental changes or other aggravating or alleviating factors except as outlined above   No unusual exposure hx or h/o childhood pna/ asthma or knowledge of premature birth.  Current Allergies, Complete Past Medical History, Past Surgical History, Family History, and Social History were reviewed in [employer] record.  ROS  The following are not active complaints unless bolded Hoarseness, sore throat, dysphagia, dental problems, itching, sneezing,  nasal congestion or discharge  of excess mucus or purulent secretions, ear ache,   fever, chills, sweats, unintended wt loss or wt gain, classically pleuritic or exertional cp,  orthopnea pnd or arm/hand swelling  or leg swelling, presyncope, palpitations, abdominal pain, anorexia, nausea, vomiting, diarrhea  or change in bowel habits or change in bladder habits, change in stools or change in urine, dysuria, hematuria,  rash, arthralgias, visual complaints, headache, numbness, weakness or ataxia or problems with walking or coordination,  change in mood or  memory.        Current Meds  Medication Sig   Accu-Chek FastClix Lancets MISC 1 application by Does not apply route 2 (two) times daily.   albuterol (VENTOLIN HFA) 108 (90 Base) MCG/ACT inhaler Inhale 2 puffs into the lungs every 6 (six) hours as needed for wheezing or shortness of breath.   allopurinol  (ZYLOPRIM) 100 MG tablet Take 100 mg by mouth daily.    ALPRAZolam (XANAX) 1 MG tablet Take 0.5 mg by mouth at bedtime as needed for anxiety or sleep.   Cholecalciferol (VITAMIN D3) 50 MCG (2000 UT) TABS Take 2,000 Units by mouth daily.   dextromethorphan-guaiFENesin (MUCINEX DM) 30-600 MG 12hr tablet Take 1 tablet by mouth 2 (two) times daily.   DULoxetine (CYMBALTA) 60 MG capsule Take 60 mg by mouth daily.   fexofenadine (ALLEGRA) 180 MG tablet Take 180 mg by mouth daily.   finasteride (PROSCAR) 5 MG tablet TAKE ONE TABLET (5MG  TOTAL) BY MOUTH DAILY (Patient taking differently: Take 5 mg by mouth daily.)   gabapentin (NEURONTIN) 300 MG capsule Take 300 mg by mouth 2 (two) times daily.    glipiZIDE (GLUCOTROL XL) 5 MG 24 hr tablet TAKE ONE TABLET (5MG  TOTAL) BY MOUTH DAILY WITH BREAKFAST (Patient taking differently: Take 5 mg by mouth daily with breakfast.)   glucose blood test strip 1 each by Other route daily before breakfast. Use as instructed   metFORMIN (GLUCOPHAGE-XR) 500 MG 24 hr tablet Take 1 tablet (500 mg total) by mouth 2 (two) times daily with a meal.   ondansetron (ZOFRAN) 4 MG tablet Take 1 tablet (4 mg total) by mouth every 12 (twelve) hours as needed for nausea or vomiting.   pantoprazole (PROTONIX) 40 MG tablet TAKE ONE TABLET (40MG  TOTAL) BY MOUTH TWO TIMES DAILY BEFORE A MEAL (Patient taking differently: Take 40 mg by mouth 2 (two) times daily.)   pioglitazone (ACTOS) 30 MG tablet TAKE ONE TABLET (30MG  TOTAL) BY MOUTH DAILY (Patient taking differently: Take 30 mg by mouth daily.)   rosuvastatin (CRESTOR) 10 MG tablet TAKE ONE TABLET (10MG  TOTAL) BY MOUTH DAILY (Patient taking differently: Take 10 mg by mouth daily.)   Semaglutide (RYBELSUS) 3 MG TABS Take 1 tablet (3 mg total) by mouth daily.   tamsulosin (FLOMAX) 0.4 MG CAPS capsule TAKE ONE CAPSULE (0.4MG  TOTAL) BY MOUTH TWO TIMES DAILY (Patient taking differently: Take 0.4 mg by mouth daily.)   traMADol (ULTRAM) 50 MG tablet  Take 1 tablet (50 mg total) by mouth every 6 (six) hours as needed for moderate pain (pain.).   warfarin (COUMADIN) 5 MG tablet TAKE ONE TABLET BY MOUTH DAILY EXCEPT TAKE ONE AND ONE-HALF TABLETS ON TUESDAYS AND FRIDAYS OR AS DIRECTED (Patient taking differently: Take 5 mg by mouth See admin instructions. TAKE ONE TABLET BY MOUTH DAILY EXCEPT TAKE ONE AND ONE-HALF TABLETS ON TUESDAYS AND FRIDAYS OR AS DIRECTED)                    Past  Medical History:  Diagnosis Date   Barrett's esophagus    short-segment, diagnosed in 2007 by Dr. Jena Gauss   BPH (benign prostatic hyperplasia)    CAD (coronary artery disease)    a. cath on 07/21/2019 showing patent LM and LCx with 40-60% Proximal LAD and 30-40% Pro-RCA stenosis with normal LV function.   GERD (gastroesophageal reflux disease)    Hiatal hernia    small   HTN (hypertension)    PAF (paroxysmal atrial fibrillation) (HCC)    S/P colonoscopy 2007   unremarkable   S/P endoscopy Oct 2011    Salmon-colored epithelium coming up to 37 cm from the    Vertigo       Objective:    Wts   05/14/2023       180  01/21/23 208 lb (94.3 kg)  01/11/23 205 lb (93 kg)  12/04/22 205 lb 6.4 oz (93.2 kg)     Vital signs reviewed  05/14/2023  - Note at rest 02 sats  96% on RA   General appearance:    amb pleasant wm nad/ mildly congested sounding cough    HEENT : Oropharynx  clear      Nasal turbinates nl    NECK :  without  apparent JVD/ palpable Nodes/TM    LUNGS: no acc muscle use,  Nl contour chest which is clear to A and P bilaterally with cough on insp maneuvers   CV:  slt irreg  no s3 or murmur or increase in P2, and no edema   ABD:  soft and nontender with nl inspiratory excursion in the supine position. No bruits or organomegaly appreciated   MS:  Nl gait/ ext warm without deformities Or obvious joint restrictions  calf tenderness, cyanosis or clubbing    SKIN: warm and dry without lesions    NEURO:  alert, approp, nl sensorium with   no motor or cerebellar deficits apparent.    CXR PA and Lateral:   05/14/2023 :    I personally reviewed images and impression is as follows:     Marked improvement aeration/ CM s effusions              Assessment

## 2023-05-13 ENCOUNTER — Telehealth: Payer: Self-pay | Admitting: *Deleted

## 2023-05-13 ENCOUNTER — Telehealth: Payer: Self-pay | Admitting: Nurse Practitioner

## 2023-05-13 DIAGNOSIS — I2510 Atherosclerotic heart disease of native coronary artery without angina pectoris: Secondary | ICD-10-CM | POA: Diagnosis not present

## 2023-05-13 DIAGNOSIS — E663 Overweight: Secondary | ICD-10-CM | POA: Diagnosis not present

## 2023-05-13 DIAGNOSIS — J189 Pneumonia, unspecified organism: Secondary | ICD-10-CM | POA: Diagnosis not present

## 2023-05-13 DIAGNOSIS — E538 Deficiency of other specified B group vitamins: Secondary | ICD-10-CM | POA: Diagnosis not present

## 2023-05-13 DIAGNOSIS — Z6829 Body mass index (BMI) 29.0-29.9, adult: Secondary | ICD-10-CM | POA: Diagnosis not present

## 2023-05-13 DIAGNOSIS — I480 Paroxysmal atrial fibrillation: Secondary | ICD-10-CM | POA: Diagnosis not present

## 2023-05-13 DIAGNOSIS — E1140 Type 2 diabetes mellitus with diabetic neuropathy, unspecified: Secondary | ICD-10-CM | POA: Diagnosis not present

## 2023-05-13 NOTE — Progress Notes (Unsigned)
  Care Coordination  Outreach Note  05/13/2023 Name: [name] MRN: [id number] DOB: 11/04/1941   Care Coordination Outreach Attempts: An unsuccessful telephone outreach was attempted today to offer the patient information about available care coordination services.  Follow Up Plan:  Additional outreach attempts will be made to offer the patient care coordination information and services.   Encounter Outcome:  No Answer  Gwenevere Ghazi  Care Coordination Care Guide  Direct Dial: [phone]

## 2023-05-13 NOTE — Telephone Encounter (Signed)
Pt is asking for refills on his dexcom sensor to [facility]. He is on his last sensor.

## 2023-05-14 ENCOUNTER — Ambulatory Visit (HOSPITAL_COMMUNITY)
Admission: RE | Admit: 2023-05-14 | Discharge: 2023-05-14 | Disposition: A | Discharge status: Home / Self Care | Payer: Medicare Other | Source: Ambulatory Visit | Attending: Internal Medicine | Admitting: Internal Medicine

## 2023-05-14 ENCOUNTER — Encounter: Payer: Self-pay | Admitting: Internal Medicine

## 2023-05-14 ENCOUNTER — Ambulatory Visit (INDEPENDENT_AMBULATORY_CARE_PROVIDER_SITE_OTHER): Payer: Medicare Other | Admitting: Internal Medicine

## 2023-05-14 VITALS — BP 112/64 | HR 71 | Ht 71.0 in | Wt 180.0 lb

## 2023-05-14 DIAGNOSIS — A419 Sepsis, unspecified organism: Secondary | ICD-10-CM | POA: Diagnosis not present

## 2023-05-14 DIAGNOSIS — E119 Type 2 diabetes mellitus without complications: Secondary | ICD-10-CM | POA: Diagnosis not present

## 2023-05-14 DIAGNOSIS — I480 Paroxysmal atrial fibrillation: Secondary | ICD-10-CM | POA: Diagnosis not present

## 2023-05-14 DIAGNOSIS — J9601 Acute respiratory failure with hypoxia: Secondary | ICD-10-CM | POA: Diagnosis not present

## 2023-05-14 DIAGNOSIS — J189 Pneumonia, unspecified organism: Secondary | ICD-10-CM | POA: Diagnosis not present

## 2023-05-14 DIAGNOSIS — J849 Interstitial pulmonary disease, unspecified: Secondary | ICD-10-CM | POA: Diagnosis not present

## 2023-05-14 DIAGNOSIS — R0609 Other forms of dyspnea: Secondary | ICD-10-CM | POA: Insufficient documentation

## 2023-05-14 DIAGNOSIS — R918 Other nonspecific abnormal finding of lung field: Secondary | ICD-10-CM | POA: Diagnosis not present

## 2023-05-14 DIAGNOSIS — R059 Cough, unspecified: Secondary | ICD-10-CM | POA: Diagnosis not present

## 2023-05-14 DIAGNOSIS — R053 Chronic cough: Secondary | ICD-10-CM | POA: Insufficient documentation

## 2023-05-14 NOTE — Assessment & Plan Note (Addendum)
Onset with covid Nov 2023 / worse p CAP 05/03/23 no better with saba  - 05/14/2023 rec max gerd/ flutter valve / mucinex dm   Of the three most common causes of  Sub-acute / recurrent or chronic cough, only one (GERD)  can actually contribute to/ trigger  the other two (asthma and post nasal drip syndrome)  and perpetuate the cylce of cough.  While not intuitively obvious, many patients with chronic low grade reflux do not cough until there is a primary insult that disturbs the protective epithelial barrier and exposes sensitive nerve endings.   This is typically viral but can due to PNDS and  either may apply here.  >>>   The point is that once this occurs, it is difficult to eliminate the cycle  using anything but a maximally effective acid suppression regimen at least in the short run, accompanied by an appropriate diet to address non acid GERD and control / eliminate the urge to cough with use of non-mint, non-menthol (NO HALLs) hard rock candy  and hold off on any prednisone for now given his reluctance to use it empirically in this setting due to prior effects on his blood sugars  F/u in 4 weeks with all meds in hand using a trust but verify approach to confirm accurate Medication  Reconciliation The principal here is that until we are certain that the  patients are doing what we've asked, it makes no sense to ask them to do more.   Each maintenance medication was reviewed in detail including emphasizing most importantly the difference between maintenance and prns and under what circumstances the prns are to be triggered using an action plan format where appropriate.  Total time for H and P, chart review, counseling, reviewing hfa device(s) , directly observing portions of ambulatory 02 saturation study/ and generating customized AVS unique to this office visit / same day charting > 40 min post hosp f/u ov

## 2023-05-14 NOTE — Telephone Encounter (Signed)
Sent request to [employer]

## 2023-05-14 NOTE — Patient Instructions (Addendum)
For cough/ congestion >  Mucinex DM  up to maximum of 1200 mg every 12 hours and use the flutter valve as much as you can  when coughing  Pantoprazole 40 mg Take 30- 60 min before your first and last meals of the day   GERD (REFLUX)  is an extremely common cause of respiratory symptoms just like yours , many times with no obvious heartburn at all.    It can be treated with medication, but also with lifestyle changes including elevation of the head of your bed (ideally with 6 -8inch blocks under the headboard of your bed),  Smoking cessation, avoidance of late meals, excessive alcohol, and avoid fatty foods, chocolate, peppermint, colas, red wine, and acidic juices such as orange juice.  NO MINT OR MENTHOL PRODUCTS SO NO COUGH DROPS  USE SUGARLESS CANDY INSTEAD (Jolley ranchers or Stover's or Life Savers) or even ice chips will also do - the key is to swallow to prevent all throat clearing. NO OIL BASED VITAMINS - use powdered substitutes.  Avoid fish oil when coughing.   Please remember to go to the  x-ray department  @  [facility] for your tests - we will call you with the results when they are available    Saline nasal spray as needed for dry nose and follow Teoh   Please schedule a follow up office visit in 4 weeks, sooner if needed  with all medications /inhalers/ solutions/devices  in hand so we can verify exactly what you are taking. This includes all medications from all doctors and over the counters

## 2023-05-14 NOTE — Assessment & Plan Note (Signed)
Onset around 2019 assoc neuropathy - Stopped smoking 1994 with only chronic resp symptom p stopped = rhinitis better with allegra - Echo 04/19/20  Nl lv, trivial MR with nl LA and RA  -  06/28/2020   Walked RA  approx   avg ft  @ 600 pace  stopped due to sob with sats still 97% and no orthostatic complaints    - Covid Thxgiving 2023  - 01/21/2023   Walked on RA  x  3  lap(s) =  approx 750  ft  @ mod/avg pace, stopped due to end of study with lowest 02 sats 95% s sob talking entire time  - CAP/ resp failure 05/03/23 > not d/c'd on 02  - Echo 05/04/23 ok  - 05/14/2023   Walked on RA  x  3  lap(s) =  approx 450 ft  @ mod pace, stopped due to end of study with lowest 02 sats 96%  and cough flared but not sob   Doing well considering the severity of CAP with main residual = cough (see separate a/p) though likely has element of fibroproliferative dz s/p  ALI note esr was nl and he is very reluctant to take steroids even if this proved to be the issue  - prefers tincture of time and serial walking sats to monitor at home

## 2023-05-14 NOTE — Progress Notes (Signed)
  Care Coordination   Note   05/14/2023 Name: [name] MRN: [id number] DOB: [birth year]  [name] is a 82 y.o. year old male who sees Elfredia Nevins, MD for primary care. I reached out to [name] by phone today to offer care coordination services.  [name] was given information about Care Coordination services today including:   The Care Coordination services include support from the care team which includes your Nurse Coordinator, Clinical Social Worker, or Pharmacist.  The Care Coordination team is here to help remove barriers to the health concerns and goals most important to you. Care Coordination services are voluntary, and the patient may decline or stop services at any time by request to their care team member.   Care Coordination Consent Status: Patient agreed to services and verbal consent obtained.   Follow up plan:  Telephone appointment with care coordination team member scheduled for:  05/24/23  Encounter Outcome:  Pt. Scheduled  [facility] Coordination Care Guide  Direct Dial: [phone]

## 2023-05-17 ENCOUNTER — Telehealth: Payer: Self-pay | Admitting: Nurse Practitioner

## 2023-05-17 DIAGNOSIS — J849 Interstitial pulmonary disease, unspecified: Secondary | ICD-10-CM | POA: Diagnosis not present

## 2023-05-17 DIAGNOSIS — J9601 Acute respiratory failure with hypoxia: Secondary | ICD-10-CM | POA: Diagnosis not present

## 2023-05-17 DIAGNOSIS — E119 Type 2 diabetes mellitus without complications: Secondary | ICD-10-CM | POA: Diagnosis not present

## 2023-05-17 DIAGNOSIS — I480 Paroxysmal atrial fibrillation: Secondary | ICD-10-CM | POA: Diagnosis not present

## 2023-05-17 DIAGNOSIS — J189 Pneumonia, unspecified organism: Secondary | ICD-10-CM | POA: Diagnosis not present

## 2023-05-17 DIAGNOSIS — A419 Sepsis, unspecified organism: Secondary | ICD-10-CM | POA: Diagnosis not present

## 2023-05-17 NOTE — Telephone Encounter (Signed)
Called pt that Rybelsus is here for pick up (7mg ). He will pick up

## 2023-05-20 ENCOUNTER — Ambulatory Visit: Payer: Medicare Other | Attending: Orthopedic Surgery | Admitting: *Deleted

## 2023-05-20 DIAGNOSIS — Z5181 Encounter for therapeutic drug level monitoring: Secondary | ICD-10-CM | POA: Insufficient documentation

## 2023-05-20 DIAGNOSIS — I4891 Unspecified atrial fibrillation: Secondary | ICD-10-CM | POA: Insufficient documentation

## 2023-05-20 LAB — POCT INR: INR: 2.3 (ref 2.0–3.0)

## 2023-05-20 NOTE — Patient Instructions (Signed)
Continue warfarin 1 tablet daily except 1 1/2 tablets on Tuesdays and Fridays Recheck in 4 wks 

## 2023-05-21 DIAGNOSIS — J9601 Acute respiratory failure with hypoxia: Secondary | ICD-10-CM | POA: Diagnosis not present

## 2023-05-21 DIAGNOSIS — J849 Interstitial pulmonary disease, unspecified: Secondary | ICD-10-CM | POA: Diagnosis not present

## 2023-05-21 DIAGNOSIS — A419 Sepsis, unspecified organism: Secondary | ICD-10-CM | POA: Diagnosis not present

## 2023-05-21 DIAGNOSIS — E119 Type 2 diabetes mellitus without complications: Secondary | ICD-10-CM | POA: Diagnosis not present

## 2023-05-21 DIAGNOSIS — I480 Paroxysmal atrial fibrillation: Secondary | ICD-10-CM | POA: Diagnosis not present

## 2023-05-21 DIAGNOSIS — J189 Pneumonia, unspecified organism: Secondary | ICD-10-CM | POA: Diagnosis not present

## 2023-05-22 DIAGNOSIS — J9601 Acute respiratory failure with hypoxia: Secondary | ICD-10-CM | POA: Diagnosis not present

## 2023-05-22 DIAGNOSIS — J189 Pneumonia, unspecified organism: Secondary | ICD-10-CM | POA: Diagnosis not present

## 2023-05-22 DIAGNOSIS — J849 Interstitial pulmonary disease, unspecified: Secondary | ICD-10-CM | POA: Diagnosis not present

## 2023-05-22 DIAGNOSIS — A419 Sepsis, unspecified organism: Secondary | ICD-10-CM | POA: Diagnosis not present

## 2023-05-22 DIAGNOSIS — I480 Paroxysmal atrial fibrillation: Secondary | ICD-10-CM | POA: Diagnosis not present

## 2023-05-22 DIAGNOSIS — E119 Type 2 diabetes mellitus without complications: Secondary | ICD-10-CM | POA: Diagnosis not present

## 2023-05-24 ENCOUNTER — Encounter: Payer: Self-pay | Admitting: *Deleted

## 2023-05-24 ENCOUNTER — Ambulatory Visit: Payer: Self-pay | Admitting: *Deleted

## 2023-05-24 NOTE — Patient Outreach (Addendum)
Care Coordination   Initial Visit Note   05/24/2023 Name: [name] MRN: [id number] DOB: 1941/05/07  [name] is a 82 y.o. year old male who sees Elfredia Nevins, MD for primary care. I spoke with  [name] by phone today.  What matters to the patients health and wellness today?  Managing neuropathic pain, managing blood sugar, resolving pneumonia      Goals Addressed             This Visit's Progress    Improve Management of Neuropathy       Care Coordination Goals: Patient will continue to take medications as prescribed Patient will follow-up with provider regarding management of neuropathic pain an discuss if increasing gabapentin may help or if there are other options to aid in pain management Patient will continue non-pharmaceutical pain management strategies: warm compresses Patient will reach out to provider with any new or worsening symptoms Patient will move carefully and change positions slowly to decrease risk for falls Patient will reach out to RN Care Coordinator with any resource or care coordination needs [phone]     Manage Blood Sugar       Care Coordination Goals: Patient will follow-up with PCP and/or endocrinologist every 3 months or as recommended Patient will take medication as prescribed and reach out to provider with any negative side effects Patient will continue to monitor and record blood sugar daily and as needed with Dexcom, and will call PCP or endocrinologist with any readings outside of recommended range Patient will take blood sugar log and meter to provider visits for review Patient will follow a modified carbohydrate diet and decrease simple carbohydrates and sugars Patient will increase activity level as tolerated with an ultimate goal of at least 150 minutes of exercise per week Patient will reach out to RN Care Coordinator [phone] with any care coordination or resource needs      Resolve Pneumonia       Care  Coordination Goals: Patient will take medications as prescribed Patient will follow-up with pulmonologist as scheduled and sooner if needed Patient will continue to perform physical therapy exercises daily Patient will continue to use incentive spirometer and flutter valve to exercise lungs Patient will reach out to provider with any new or worsening symptoms Patient will reach out to RN Care Coordinator [phone] with any resource or care coordination needs        SDOH assessments and interventions completed:  Yes  SDOH Interventions Today    Flowsheet Row Most Recent Value  SDOH Interventions   Transportation Interventions Intervention Not Indicated  Financial Strain Interventions Intervention Not Indicated       Care Coordination Interventions:  Yes, provided  Interventions Today    Flowsheet Row Most Recent Value  Chronic Disease   Chronic disease during today's visit Other  [polyneuropathy]  General Interventions   General Interventions Discussed/Reviewed General Interventions Discussed, General Interventions Reviewed, Labs, Doctor Visits, Sick Day Rules, Horticulturist, commercial (DME)  [blood sugar non-fasting 135 today. Reviewed recent chest xray and discussed with patient. Repeat in 3-4 weeks]  Labs Hgb A1c every 3 months  Doctor Visits Discussed/Reviewed Doctor Visits Discussed, Doctor Visits Reviewed, PCP, Specialist  Durable Medical Equipment (DME) Glucomoter  [Dexcom CGM]  PCP/Specialist Visits Compliance with follow-up visit  [Dr Sherene Sires (pulmonary) 06/11/23, Endocrinoloy 06/10/23, Dr Sherwood Gambler (PCP) 06/25/23]  Exercise Interventions   Exercise Discussed/Reviewed Physical Activity  Physical Activity Discussed/Reviewed Physical Activity Discussed, Physical Activity Reviewed  Education Interventions  Education Provided Provided Printed Education  [printed materials on diabetic neuropathy mailed]  Provided Verbal Education On Medication, Blood Sugar Monitoring, Nutrition,  Mental Health/Coping with Illness, When to see the doctor, Other  [flutter valve and spirometer]  Nutrition Interventions   Nutrition Discussed/Reviewed Nutrition Discussed, Nutrition Reviewed, Carbohydrate meal planning, Portion sizes, Decreasing sugar intake, Fluid intake  Pharmacy Interventions   Pharmacy Dicussed/Reviewed Pharmacy Topics Discussed, Pharmacy Topics Reviewed, Medications and their functions  [Patient to talk with PCP re: neuropathy and see if gabapentin can be increased or if any other medications may help]  Safety Interventions   Safety Discussed/Reviewed Safety Discussed, Safety Reviewed, Fall Risk  [does not use assistive devices]       Follow up plan: Follow up call scheduled for 06/27/23    Encounter Outcome:  Pt. Visit Completed   Demetrios Loll, BSN, RN-BC RN Care Coordinator [facility]  [facility] Direct Dial: [phone] Main #: [phone]

## 2023-05-28 ENCOUNTER — Telehealth: Payer: Self-pay | Admitting: Nurse Practitioner

## 2023-05-28 DIAGNOSIS — I480 Paroxysmal atrial fibrillation: Secondary | ICD-10-CM | POA: Diagnosis not present

## 2023-05-28 DIAGNOSIS — E119 Type 2 diabetes mellitus without complications: Secondary | ICD-10-CM | POA: Diagnosis not present

## 2023-05-28 DIAGNOSIS — A419 Sepsis, unspecified organism: Secondary | ICD-10-CM | POA: Diagnosis not present

## 2023-05-28 DIAGNOSIS — J849 Interstitial pulmonary disease, unspecified: Secondary | ICD-10-CM | POA: Diagnosis not present

## 2023-05-28 DIAGNOSIS — J9601 Acute respiratory failure with hypoxia: Secondary | ICD-10-CM | POA: Diagnosis not present

## 2023-05-28 DIAGNOSIS — J189 Pneumonia, unspecified organism: Secondary | ICD-10-CM | POA: Diagnosis not present

## 2023-05-28 NOTE — Telephone Encounter (Signed)
Ok, we will discuss it at his next visit.  I am not wanting to put him on any additional medications as they can be sedating which will increase his risk of falling.

## 2023-05-28 NOTE — Telephone Encounter (Signed)
Pt came in and stated that he is experiencing neuropathy in his hands and feet and its steadily getting worse.

## 2023-05-29 DIAGNOSIS — D0461 Carcinoma in situ of skin of right upper limb, including shoulder: Secondary | ICD-10-CM | POA: Diagnosis not present

## 2023-05-29 NOTE — Telephone Encounter (Signed)
Called pt to let him know it could be discussed at next appt.

## 2023-06-06 DIAGNOSIS — I10 Essential (primary) hypertension: Secondary | ICD-10-CM | POA: Diagnosis not present

## 2023-06-06 DIAGNOSIS — N179 Acute kidney failure, unspecified: Secondary | ICD-10-CM | POA: Diagnosis not present

## 2023-06-06 DIAGNOSIS — G9341 Metabolic encephalopathy: Secondary | ICD-10-CM | POA: Diagnosis not present

## 2023-06-06 DIAGNOSIS — A419 Sepsis, unspecified organism: Secondary | ICD-10-CM | POA: Diagnosis not present

## 2023-06-06 DIAGNOSIS — D649 Anemia, unspecified: Secondary | ICD-10-CM | POA: Diagnosis not present

## 2023-06-06 DIAGNOSIS — E876 Hypokalemia: Secondary | ICD-10-CM | POA: Diagnosis not present

## 2023-06-06 DIAGNOSIS — Z7901 Long term (current) use of anticoagulants: Secondary | ICD-10-CM | POA: Diagnosis not present

## 2023-06-06 DIAGNOSIS — Z8616 Personal history of COVID-19: Secondary | ICD-10-CM | POA: Diagnosis not present

## 2023-06-06 DIAGNOSIS — N400 Enlarged prostate without lower urinary tract symptoms: Secondary | ICD-10-CM | POA: Diagnosis not present

## 2023-06-06 DIAGNOSIS — J849 Interstitial pulmonary disease, unspecified: Secondary | ICD-10-CM | POA: Diagnosis not present

## 2023-06-06 DIAGNOSIS — K219 Gastro-esophageal reflux disease without esophagitis: Secondary | ICD-10-CM | POA: Diagnosis not present

## 2023-06-06 DIAGNOSIS — D696 Thrombocytopenia, unspecified: Secondary | ICD-10-CM | POA: Diagnosis not present

## 2023-06-06 DIAGNOSIS — E785 Hyperlipidemia, unspecified: Secondary | ICD-10-CM | POA: Diagnosis not present

## 2023-06-06 DIAGNOSIS — K449 Diaphragmatic hernia without obstruction or gangrene: Secondary | ICD-10-CM | POA: Diagnosis not present

## 2023-06-06 DIAGNOSIS — E119 Type 2 diabetes mellitus without complications: Secondary | ICD-10-CM | POA: Diagnosis not present

## 2023-06-06 DIAGNOSIS — J189 Pneumonia, unspecified organism: Secondary | ICD-10-CM | POA: Diagnosis not present

## 2023-06-06 DIAGNOSIS — J9601 Acute respiratory failure with hypoxia: Secondary | ICD-10-CM | POA: Diagnosis not present

## 2023-06-06 DIAGNOSIS — I2510 Atherosclerotic heart disease of native coronary artery without angina pectoris: Secondary | ICD-10-CM | POA: Diagnosis not present

## 2023-06-06 DIAGNOSIS — F419 Anxiety disorder, unspecified: Secondary | ICD-10-CM | POA: Diagnosis not present

## 2023-06-06 DIAGNOSIS — I480 Paroxysmal atrial fibrillation: Secondary | ICD-10-CM | POA: Diagnosis not present

## 2023-06-06 DIAGNOSIS — G2581 Restless legs syndrome: Secondary | ICD-10-CM | POA: Diagnosis not present

## 2023-06-06 DIAGNOSIS — Z7984 Long term (current) use of oral hypoglycemic drugs: Secondary | ICD-10-CM | POA: Diagnosis not present

## 2023-06-06 DIAGNOSIS — Z87891 Personal history of nicotine dependence: Secondary | ICD-10-CM | POA: Diagnosis not present

## 2023-06-06 DIAGNOSIS — K2270 Barrett's esophagus without dysplasia: Secondary | ICD-10-CM | POA: Diagnosis not present

## 2023-06-10 ENCOUNTER — Encounter: Payer: Self-pay | Admitting: Nurse Practitioner

## 2023-06-10 ENCOUNTER — Ambulatory Visit (INDEPENDENT_AMBULATORY_CARE_PROVIDER_SITE_OTHER): Payer: Medicare Other | Admitting: Nurse Practitioner

## 2023-06-10 VITALS — BP 108/65 | HR 62 | Ht 71.0 in | Wt 216.6 lb

## 2023-06-10 DIAGNOSIS — E1165 Type 2 diabetes mellitus with hyperglycemia: Secondary | ICD-10-CM | POA: Diagnosis not present

## 2023-06-10 DIAGNOSIS — E559 Vitamin D deficiency, unspecified: Secondary | ICD-10-CM

## 2023-06-10 DIAGNOSIS — G6289 Other specified polyneuropathies: Secondary | ICD-10-CM

## 2023-06-10 LAB — POCT GLYCOSYLATED HEMOGLOBIN (HGB A1C): Hemoglobin A1C: 6.9 % — AB (ref 4.0–5.6)

## 2023-06-10 LAB — POCT UA - MICROALBUMIN
Creatinine, POC: 100 mg/dL
Microalbumin Ur, POC: 10 mg/L

## 2023-06-10 MED ORDER — METFORMIN HCL ER 500 MG PO TB24: 1000.0000 mg | ORAL_TABLET | Freq: Every day | ORAL | 1 refills | Status: DC

## 2023-06-10 MED ORDER — GLIPIZIDE ER 5 MG PO TB24: 5.0000 mg | ORAL_TABLET | Freq: Every day | ORAL | 1 refills | Status: DC

## 2023-06-10 MED ORDER — PIOGLITAZONE HCL 30 MG PO TABS: 30.0000 mg | ORAL_TABLET | Freq: Every day | ORAL | 1 refills | Status: DC

## 2023-06-10 NOTE — Progress Notes (Unsigned)
[name], male    DOB: [birth year]    MRN: [id number]   Brief patient profile:  69 yowm retired [employer] office/ [employer]  quit smoking in 1994 and some problem with nasal congestion that resolved  On daily allegra  then around 2018/19 doe indolent onset assoc with bilateral neuropathy and chronic back pain  referred to pulmonary clinic in [place]  06/28/2020 by Dr Sherwood Gambler    History of Present Illness  06/28/2020  Pulmonary/ 1st office eval/[name]  Chief Complaint  Patient presents with   Consult    shortness of breath with exertion  Dyspnea:  Uses HC / does ok shopping but has to stop at top of basement steps where didn't previously assoc  feels light headed never supine  Cough: some in am  Sleep: on flat bed/ either side, 2 pillows  SABA use: none Rec We will need to set you with PFT's > not done  Covid p Thxgiving 2023 and cough since > dark initially   then  gradually lighter so by end of March minimal and white and mostly in am.   01/21/2023  Re-establish  ov/[name] re: post covid cough   Chief Complaint  Patient presents with   Acute Visit    Pt states had covid 07 Oct 2022 and has had a cough since. He states he gets winded walking up a flight of stairs.Cough occurs in the am- non prod.   Dyspnea:  still stops at top of basement steps/ dizzy too was walking  Cough: minimal  Sleeping:  flat bed/ on side 2 pillows  SABA use:occ saba  02: none  Covid status:   vax x 3  Rec To get the most out of exercise, you need to be continuously aware that you are short of breath, but never out of breath, for at least 20-30 minutes daily.   Make sure you check your oxygen saturations at highest level of activity  Please schedule a follow up office visit in 4 weeks, sooner if needed in [place] Office> did not return  as rec but symptoms subsided back to baseline    Admission date:  05/03/2023    Discharge Date:  05/06/2023   Recommendations for primary care physician for  things to follow:  1)Follow up PCP in 1 to 2 weeks for recheck 2)Repeat chest x-ray in 4 to 6  weeks advised to ensure resolution of right-sided pneumonia and exclude underlying abnormalities 3)Repeat CBC, BMP and PT/INR blood test on Friday, 05/10/2023 advised 4)Follow - up with Dr.Blimi Godby-  5)Continue Incentive Spiromentry---as advised--   Admission Diagnosis  Pneumonia [J18.9] Severe sepsis (HCC) [A41.9, R65.20] Community acquired pneumonia of right lower lobe of lung [J18.9] Acute hypoxic respiratory failure (HCC) [J96.01]   Discharge Diagnosis  Pneumonia [J18.9] Severe sepsis (HCC) [A41.9, R65.20] Community acquired pneumonia of right lower lobe of lung [J18.9] Acute hypoxic respiratory failure (HCC) [J96.01]     Principal Problem:   Sepsis due to pneumonia (HCC)    Pneumonia   GERD   Atrial fibrillation (HCC)   Polyneuropathy due to type 2 diabetes mellitus (HCC)      [name]  is a 82 y.o. male with past medical history relevant for PAFib on Coumadin, restless leg syndrome, interstitial lung disease, DM2, HTN, BPH and GERD who presents to the ED with complaints of sleepiness  Apparently patient was diagnosed with COVID respiratory infection   November 2023 and has had on and off medical issues especially respiratory issues  since then -Has been treated recently with antibiotics and steroids -Patient apparently has been confused and disoriented on and off for the last 24 hours   -Chest x-ray with acute multilobar right lung pneumonia -COVID, flu and RSV negative   -Assessment and Plan: 1)Sepsis secondary to right-sided pneumonia--POA-- -patient met sepsis criteria on admission, also had organ dysfunction including acute hypoxic respiratory failure, AKI and acute metabolic encephalopathy -history of underlying interstitial lung disease -Chest x-ray and CTA chest consistent with extensive right-sided pneumonia in all lobes, no PE -COVID, flu and RSV negative Lactic acid 2.7  improved to 1.9 with IV fluids -Continue IV Rocephin/azithromycin pending culture data --c/n mucolytic's and bronchodilators-   2)Post-Tussive Chest Pains-----suspect chest pains are related to right-sided pneumonia with persistent cough -EKG sinus rhythm without acute ACS type findings Troponin 42 >> 36 CTA chest without PE, right-sided pneumonia noted as above #1 -CTA chest also showed chronic pericardial calcification, 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---ESR is only 23, CRP and echo pending   3)AKI----acute kidney injury  --Creatinine --1.34---baseline 0.9 to 1.0,  -Hydrate IV and orally --renally adjust medications, avoid nephrotoxic agents / dehydration  / hypotension   4)PAFib--- INR ---therapeutic -Currently in sinus rhythm -Continue Coumadin for stroke prophylaxis   5) acute metabolic encephalopathy--- due to #1 #3 above -As per patient's older brother [name] was at bedside patient's mentation has improved, getting close to baseline, --Anticipate further improvement with treatment of #1 #3  above   6)Generalized weakness/deconditioning and ambulatory dysfunction-- -family reports that patient was diagnosed with COVID respiratory infection back in November 2023 and has had on and off medical issues especially respiratory issues since then -Has been treated recently with antibiotics and steroids     >>>  acute hypoxic respiratory failure--- due to pneumonia as above #1 -Oxygen has been weaned down to 3 L   --Was Not on O2 prior to admission> not discharge on 02      05/14/2023  f/u ov/[name] re: post covid cough x tgiving 2023   resolved to his satisfaction prior to above admit  Chief Complaint  Patient presents with   Hospital Followup     Hospitalized for PNA 05/03/23-05/06/23. Breathing has improved. He c/o fatigue and cough with clear sputum.   Dyspnea:  able to do steps up from basement sev times a day s stopping  Cough: in am's most  notable but also with talking not with waking/ all clear mucus now  Sleeping: flat bed / 2 pillows and does not actually wake him up  SABA use: albuterol not sure helping  02: none  Using lots of Hall's cough drops daytime Rec For cough/ congestion >  Mucinex DM  up to maximum of 1200 mg every 12 hours and use the flutter valve as much as you can  when coughing Pantoprazole 40 mg Take 30- 60 min before your first and last meals of the day  GERD diet reviewed, bed blocks rec   Saline nasal spray as needed for dry nose and follow up with  [name]   Please schedule a follow up office visit in 4 weeks, sooner if needed  with all medications /inhalers/ solutions/devices  in hand    Please remember to go to the  x-ray department  1. Improving right pulmonary infiltrate. Follow-up chest radiograph is recommended in 3-4 weeks    06/11/2023  f/u ov/[place] office/[name] re: *** maint on *** did *** bring meds needs f/u cxr ***  No chief  complaint on file.   Dyspnea:  *** Cough: *** Sleeping: *** SABA use: *** 02: *** Covid status: *** Lung cancer screening: ***   No obvious day to day or daytime variability or assoc excess/ purulent sputum or mucus plugs or hemoptysis or cp or chest tightness, subjective wheeze or overt sinus or hb symptoms.   *** without nocturnal  or early am exacerbation  of respiratory  c/o's or need for noct saba. Also denies any obvious fluctuation of symptoms with weather or environmental changes or other aggravating or alleviating factors except as outlined above   No unusual exposure hx or h/o childhood pna/ asthma or knowledge of premature birth.  Current Allergies, Complete Past Medical History, Past Surgical History, Family History, and Social History were reviewed in [employer] record.  ROS  The following are not active complaints unless bolded Hoarseness, sore throat, dysphagia, dental problems, itching, sneezing,  nasal congestion or  discharge of excess mucus or purulent secretions, ear ache,   fever, chills, sweats, unintended wt loss or wt gain, classically pleuritic or exertional cp,  orthopnea pnd or arm/hand swelling  or leg swelling, presyncope, palpitations, abdominal pain, anorexia, nausea, vomiting, diarrhea  or change in bowel habits or change in bladder habits, change in stools or change in urine, dysuria, hematuria,  rash, arthralgias, visual complaints, headache, numbness, weakness or ataxia or problems with walking or coordination,  change in mood or  memory.        No outpatient medications have been marked as taking for the 06/11/23 encounter (Appointment) with Nyoka Cowden, MD.                      Past Medical History:  Diagnosis Date   Barrett's esophagus    short-segment, diagnosed in 2007 by Dr. Jena Gauss   BPH (benign prostatic hyperplasia)    CAD (coronary artery disease)    a. cath on 07/21/2019 showing patent LM and LCx with 40-60% Proximal LAD and 30-40% Pro-RCA stenosis with normal LV function.   GERD (gastroesophageal reflux disease)    Hiatal hernia    small   HTN (hypertension)    PAF (paroxysmal atrial fibrillation) (HCC)    S/P colonoscopy 2007   unremarkable   S/P endoscopy Oct 2011    Salmon-colored epithelium coming up to 37 cm from the    Vertigo       Objective:    Wts  06/11/2023        ***  05/14/2023       180  01/21/23 208 lb (94.3 kg)  01/11/23 205 lb (93 kg)  12/04/22 205 lb 6.4 oz (93.2 kg)     Vital signs reviewed  05/14/2023  - Note at rest 02 sats  96% on RA     with cough on insp maneuvers***                Assessment

## 2023-06-10 NOTE — Progress Notes (Signed)
Endocrinology Follow Up Visit      06/10/2023, 3:05 PM      Subjective:    Patient ID: [name], male    DOB: October 30, 1941.  [name] is being seen in follow up after being seen in consultation for management of currently uncontrolled symptomatic diabetes requested by  Elfredia Nevins, MD.  He is a retired Emergency planning/management officer for [employer].   Past Medical History:  Diagnosis Date   Aortic atherosclerosis (HCC)    Barrett's esophagus    Short-segment, diagnosed in 2007 by Dr. Jena Gauss   BPH (benign prostatic hyperplasia)    CAD (coronary artery disease)    a. cath on 07/21/2019 showing patent LM and LCx with 40-60% Proximal LAD and 30-40% Pro-RCA stenosis with normal LV function.   Essential hypertension    GERD (gastroesophageal reflux disease)    Hiatal hernia    History of kidney stones    PAF (paroxysmal atrial fibrillation) (HCC)    S/P colonoscopy 2007   S/P endoscopy 08/2010    Salmon-colored epithelium coming up to 37 cm from the    Type 2 diabetes mellitus (HCC)    Vertigo     Past Surgical History:  Procedure Laterality Date   APPENDECTOMY     BIOPSY  01/28/2020   Procedure: BIOPSY;  Surgeon: Corbin Ade, MD;  Location: [facility];  Service: Endoscopy;;  esophagus   CHOLECYSTECTOMY     COLONOSCOPY  02/06/2006   Dr. Jena Gauss- single anal papilla, o/w normal rectum, normal colon   COLONOSCOPY N/A 09/13/2016   RMR: three 5-6 mm for adenomas removed.  Recommend one last surveillance colonoscopy in October 2020.   COLONOSCOPY WITH PROPOFOL N/A 01/28/2020   Procedure: COLONOSCOPY WITH PROPOFOL;  Surgeon: Corbin Ade, MD;  Location: [facility];  Service: Endoscopy;  Laterality: N/A;  10:45am   CORONARY PRESSURE/FFR STUDY N/A 07/21/2019   Procedure: INTRAVASCULAR PRESSURE WIRE/FFR STUDY;  Surgeon: Lyn Records, MD;  Location: [facility];  Service:  Cardiovascular;  Laterality: N/A;   CYSTOSCOPY WITH RETROGRADE PYELOGRAM, URETEROSCOPY AND STENT PLACEMENT Right 08/18/2020   Procedure: CYSTOSCOPY WITH INCISION OF RIGHT URETEROCELE, URETEROSCOPY WITH HOLMIUM LASER, CYSTOLITHOLAPAXY OF 10 MM BLADDER STONE;  Surgeon: Bjorn Pippin, MD;  Location: [facility];  Service: Urology;  Laterality: Right;   ESOPHAGOGASTRODUODENOSCOPY  09/15/2010   Dr. Jena Gauss- Salmon-colored epithelium coming up to 37 cm from the    ESOPHAGOGASTRODUODENOSCOPY N/A 07/02/2013   Dr. Jena Gauss- Barretts on bx. hiatal hernia   ESOPHAGOGASTRODUODENOSCOPY N/A 09/13/2016   RMR: Barrett's esophagus without dysplasia. one last EGD in 3 years.    ESOPHAGOGASTRODUODENOSCOPY (EGD) WITH PROPOFOL N/A 01/28/2020   Procedure: ESOPHAGOGASTRODUODENOSCOPY (EGD) WITH PROPOFOL;  Surgeon: Corbin Ade, MD;  [facility];  Service: Endoscopy;  Laterality: N/A;   LEFT HEART CATH AND CORONARY ANGIOGRAPHY N/A 07/21/2019   Procedure: LEFT HEART CATH AND CORONARY ANGIOGRAPHY;  Surgeon: Lyn Records, MD;  [facility]: [facility];  Service: Cardiovascular;  Laterality: N/A;   POLYPECTOMY  01/28/2020   Procedure: POLYPECTOMY;  Surgeon: Corbin Ade, MD;  Location: [facility];  Service: Endoscopy;;  colon   ROTATOR CUFF REPAIR     YAG LASER APPLICATION Right 12/20/2014   Procedure: YAG LASER APPLICATION;  Surgeon: Susa Simmonds, MD;  Location: [facility];  Service: Ophthalmology;  Laterality: Right;    Social History   Socioeconomic History   Marital status: Married    Spouse name: Not on file   Number of children: Not on file   Years of education: Not on file   Highest education level: Not on file  Occupational History   Not on file  Tobacco Use   Smoking status: Former    Current packs/day: 0.00    Average packs/day: 1.5 packs/day for 30.0 years (45.0 ttl pk-yrs)    Types: Cigarettes    Start date: 11/19/1963    Quit date: 11/18/1993    Years since quitting: 29.5   Smokeless  tobacco: Never   Tobacco comments:    quit in the 90's  Vaping Use   Vaping status: Never Used  Substance and Sexual Activity   Alcohol use: No   Drug use: No   Sexual activity: Not on file  Other Topics Concern   Not on file  Social History Narrative   Not on file   Social Determinants of Health   Financial Resource Strain: Low Risk  (05/24/2023)   Overall Financial Resource Strain (CARDIA)    Difficulty of Paying Living Expenses: Not very hard  Food Insecurity: No Food Insecurity (05/03/2023)   Hunger Vital Sign    Worried About Running Out of Food in the Last Year: Never true    Ran Out of Food in the Last Year: Never true  Transportation Needs: No Transportation Needs (05/24/2023)   PRAPARE - Administrator, Civil Service (Medical): No    Lack of Transportation (Non-Medical): No  Physical Activity: Not on file  Stress: Not on file  Social Connections: Not on file    Family History  Problem Relation Age of Onset   Prostate cancer Father        deceased   Stroke Mother        deceased   Colon cancer Neg Hx     Outpatient Encounter Medications as of 06/10/2023  Medication Sig   Accu-Chek FastClix Lancets MISC 1 application by Does not apply route 2 (two) times daily.   albuterol (VENTOLIN HFA) 108 (90 Base) MCG/ACT inhaler Inhale 2 puffs into the lungs every 6 (six) hours as needed for wheezing or shortness of breath.   allopurinol (ZYLOPRIM) 100 MG tablet Take 100 mg by mouth daily.    ALPRAZolam (XANAX) 1 MG tablet Take 0.5 mg by mouth at bedtime as needed for anxiety or sleep.   Cholecalciferol (VITAMIN D3) 50 MCG (2000 UT) TABS Take 2,000 Units by mouth daily.   Continuous Glucose Sensor (DEXCOM G7 SENSOR) MISC by Does not apply route.   dextromethorphan-guaiFENesin (MUCINEX DM) 30-600 MG 12hr tablet Take 1 tablet by mouth 2 (two) times daily.   DULoxetine (CYMBALTA) 60 MG capsule Take 60 mg by mouth daily.   fexofenadine (ALLEGRA) 180 MG tablet Take 180  mg by mouth daily.   finasteride (PROSCAR) 5 MG tablet TAKE ONE TABLET (5MG  TOTAL) BY MOUTH DAILY (Patient taking differently: Take 5 mg by mouth daily.)   gabapentin (NEURONTIN) 300 MG capsule Take 300 mg by mouth 2 (two) times daily.    glucose blood test strip 1 each by Other route daily before breakfast. Use as instructed  ondansetron (ZOFRAN) 4 MG tablet Take 1 tablet (4 mg total) by mouth every 12 (twelve) hours as needed for nausea or vomiting.   pantoprazole (PROTONIX) 40 MG tablet TAKE ONE TABLET (40MG  TOTAL) BY MOUTH TWO TIMES DAILY BEFORE A MEAL (Patient taking differently: Take 40 mg by mouth 2 (two) times daily.)   rosuvastatin (CRESTOR) 10 MG tablet TAKE ONE TABLET (10MG  TOTAL) BY MOUTH DAILY (Patient taking differently: Take 10 mg by mouth daily.)   tamsulosin (FLOMAX) 0.4 MG CAPS capsule TAKE ONE CAPSULE (0.4MG  TOTAL) BY MOUTH TWO TIMES DAILY (Patient taking differently: Take 0.4 mg by mouth daily.)   traMADol (ULTRAM) 50 MG tablet Take 1 tablet (50 mg total) by mouth every 6 (six) hours as needed for moderate pain (pain.).   warfarin (COUMADIN) 5 MG tablet TAKE ONE TABLET BY MOUTH DAILY EXCEPT TAKE ONE AND ONE-HALF TABLETS ON TUESDAYS AND FRIDAYS OR AS DIRECTED (Patient taking differently: Take 5 mg by mouth See admin instructions. TAKE ONE TABLET BY MOUTH DAILY EXCEPT TAKE ONE AND ONE-HALF TABLETS ON TUESDAYS AND FRIDAYS OR AS DIRECTED)   [DISCONTINUED] glipiZIDE (GLUCOTROL XL) 5 MG 24 hr tablet TAKE ONE TABLET (5MG  TOTAL) BY MOUTH DAILY WITH BREAKFAST (Patient taking differently: Take 5 mg by mouth daily with breakfast.)   [DISCONTINUED] metFORMIN (GLUCOPHAGE-XR) 500 MG 24 hr tablet Take 1 tablet (500 mg total) by mouth 2 (two) times daily with a meal.   [DISCONTINUED] pioglitazone (ACTOS) 30 MG tablet TAKE ONE TABLET (30MG  TOTAL) BY MOUTH DAILY (Patient taking differently: Take 30 mg by mouth daily.)   glipiZIDE (GLUCOTROL XL) 5 MG 24 hr tablet Take 1 tablet (5 mg total) by mouth  daily with breakfast.   metFORMIN (GLUCOPHAGE-XR) 500 MG 24 hr tablet Take 2 tablets (1,000 mg total) by mouth daily with breakfast.   pioglitazone (ACTOS) 30 MG tablet Take 1 tablet (30 mg total) by mouth daily.   [DISCONTINUED] glipiZIDE (GLUCOTROL XL) 5 MG 24 hr tablet Take 1 tablet (5 mg total) by mouth daily with breakfast. TAKE ONE TABLET (5MG  TOTAL) BY MOUTH DAILY WITH BREAKFAST Strength: 5 mg   [DISCONTINUED] Semaglutide (RYBELSUS) 3 MG TABS Take 1 tablet (3 mg total) by mouth daily. (Patient not taking: Reported on 06/10/2023)   No facility-administered encounter medications on file as of 06/10/2023.    ALLERGIES: Allergies  Allergen Reactions   Prednisone     Patient state that this medication made his blood sugar go real high    VACCINATION STATUS: Immunization History  Administered Date(s) Administered   Influenza-Unspecified 09/19/2013, 08/19/2022    Diabetes He presents for his follow-up diabetic visit. He has type 2 diabetes mellitus. Onset time: diagnosed at approximate age of 89. His disease course has been improving. There are no hypoglycemic associated symptoms. Pertinent negatives for hypoglycemia include no confusion, headaches, seizures or tremors. Associated symptoms include fatigue and foot paresthesias. Pertinent negatives for diabetes include no chest pain, no foot ulcerations, no polydipsia, no polyuria, no visual change and no weight loss. There are no hypoglycemic complications. Symptoms are stable. Diabetic complications include heart disease, impotence and peripheral neuropathy. Risk factors for coronary artery disease include diabetes mellitus, dyslipidemia, hypertension, male sex, obesity and sedentary lifestyle. Current diabetic treatment includes oral agent (triple therapy). He is compliant with treatment most of the time. His weight is fluctuating minimally. He is following a generally unhealthy diet. When asked about meal planning, he reported none. He has  not had a previous visit with a dietitian. He rarely  participates in exercise. His home blood glucose trend is decreasing steadily. His overall blood glucose range is 180-200 mg/dl. (He presents today with his meter showing above target glycemic profile.  His POCT A1c today is 7.9%, improving from last visit. Analysis of his CGM shows GMI of 7.2%, TIR 70%, TAR 30%, TBR 0%.  He notes worsening neuropathy, with numbness, burning, tingling in bilateral feet and hands.  Says neuropathy is so bad he cannot feel his feet at times.) An ACE inhibitor/angiotensin II receptor blocker is not being taken. He does not see a podiatrist.Eye exam is current.    Review of systems  Constitutional: + Minimally fluctuating body weight,  current Body mass index is 30.21 kg/m. , + fatigue, no subjective hyperthermia, no subjective hypothermia Eyes: no blurry vision, no xerophthalmia ENT: no sore throat, no nodules palpated in throat, no dysphagia/odynophagia, no hoarseness Cardiovascular: no chest pain, no shortness of breath, no palpitations, no leg swelling Respiratory: no cough, no shortness of breath Gastrointestinal: no nausea/vomiting/diarrhea Musculoskeletal: + chronic intermittent back pain (used to get injections every so often) Skin: no rashes, no hyperemia Neurological: no tremors, + numbness/tingling to BLE and hands- cannot feel feet most days, no dizziness Psychiatric: no depression, no anxiety   Objective:    BP 108/65 (BP Location: Right Arm, Patient Position: Sitting, Cuff Size: Large)   Pulse 62   Ht 5\' 11"  (1.803 m)   Wt 216 lb 9.6 oz (98.2 kg)   BMI 30.21 kg/m   Wt Readings from Last 3 Encounters:  06/10/23 216 lb 9.6 oz (98.2 kg)  05/14/23 180 lb (81.6 kg)  05/03/23 222 lb 7.1 oz (100.9 kg)    BP Readings from Last 3 Encounters:  06/10/23 108/65  05/14/23 112/64  05/06/23 121/71     Physical Exam- Limited  Constitutional:  Body mass index is 30.21 kg/m. , not in acute  distress, normal state of mind Eyes:  EOMI, no exophthalmos Musculoskeletal: no gross deformities, strength intact in all four extremities, no gross restriction of joint movements Skin:  no rashes, no hyperemia Neurological: no tremor with outstretched hands   Diabetic Foot Exam - Simple   No data filed     CMP ( most recent) CMP     Component Value Date/Time   NA 138 05/06/2023 0430   NA 141 11/30/2022 1504   K 4.0 05/06/2023 0430   CL 106 05/06/2023 0430   CO2 24 05/06/2023 0430   GLUCOSE 119 (H) 05/06/2023 0430   BUN 14 05/06/2023 0430   BUN 14 11/30/2022 1504   CREATININE 1.13 05/06/2023 0430   CREATININE 1.16 07/03/2019 1343   CALCIUM 8.5 (L) 05/06/2023 0430   PROT 6.1 (L) 05/03/2023 0700   PROT 6.2 11/30/2022 1504   ALBUMIN 3.1 (L) 05/06/2023 0430   ALBUMIN 4.1 11/30/2022 1504   AST 23 05/03/2023 0700   ALT 21 05/03/2023 0700   ALKPHOS 53 05/03/2023 0700   BILITOT 1.2 05/03/2023 0700   BILITOT 0.4 11/30/2022 1504   GFRNONAA >60 05/06/2023 0430   GFRAA >60 08/12/2020 1336     Diabetic Labs (most recent): Lab Results  Component Value Date   HGBA1C 6.9 (A) 06/10/2023   HGBA1C 7.9 (A) 03/05/2023   HGBA1C 8.4 (A) 12/04/2022   MICROALBUR 10 mg/L 06/10/2023   MICROALBUR 10 02/21/2022   MICROALBUR 30 02/23/2021     Lipid Panel ( most recent) Lipid Panel  No results found for: "CHOL", "TRIG", "HDL", "CHOLHDL", "VLDL", "LDLCALC", "LDLDIRECT", "LABVLDL"  Lab Results  Component Value Date   TSH 2.420 05/25/2022   TSH 3.235 05/02/2018   FREET4 1.10 05/25/2022         Assessment & Plan:   1) Type 2 diabetes mellitus with hyperglycemia, without long-term current use of insulin (HCC)  He presents today with his meter showing above target glycemic profile.  His POCT A1c today is 7.9%, improving from last visit. Analysis of his CGM shows GMI of 7.2%, TIR 70%, TAR 30%, TBR 0%.  He notes worsening neuropathy, with numbness, burning, tingling in bilateral feet  and hands.  Says neuropathy is so bad he cannot feel his feet at times.  - DOMANI BAKOS has currently uncontrolled symptomatic type 2 DM since 82 years of age.   Recent labs reviewed.  - I had a long discussion with him about the progressive nature of diabetes and the pathology behind its complications.  -his diabetes is complicated by CAD, peripheral neuropathy and he remains at a high risk for more acute and chronic complications which include CAD, CVA, CKD, retinopathy, and neuropathy. These are all discussed in detail with him.  - Nutritional counseling repeated at each appointment due to patients tendency to fall back in to old habits.  - The patient admits there is a room for improvement in their diet and drink choices. -  Suggestion is made for the patient to avoid simple carbohydrates from their diet including Cakes, Sweet Desserts / Pastries, Ice Cream, Soda (diet and regular), Sweet Tea, Candies, Chips, Cookies, Sweet Pastries, Store Bought Juices, Alcohol in Excess of 1-2 drinks a day, Artificial Sweeteners, Coffee Creamer, and "Sugar-free" Products. This will help patient to have stable blood glucose profile and potentially avoid unintended weight gain.   - I encouraged the patient to switch to unprocessed or minimally processed complex starch and increased protein intake (animal or plant source), fruits, and vegetables.   - Patient is advised to stick to a routine mealtimes to eat 3 meals a day and avoid unnecessary snacks (to snack only to correct hypoglycemia).  - I have approached him with the following individualized plan to manage  his diabetes and patient agrees:   -Given his improved glycemic profile overall, no changes will be made to his medications today.  He is advised to continue his Metformin 1000 mg ER daily with breakfast, Glipizide 5 mg po daily with breakfast, and Actos 30 mg po daily.  -He did qualify for PAP for Rybelsus, unfortunately he did not tolerate that  medication due to vertigo.  -He is encouraged to monitor blood glucose at least twice daily (using his CGM), before breakfast and before bed, and to notify the clinic if his glucose readings are less than 70 or above 300 for 3 tests in a row.  He has benefited from CGM device, is advised to continue wearing it.  - Specific targets for  A1c;  LDL, HDL,  and Triglycerides were discussed with the patient.  2) Blood Pressure /Hypertension:  His blood pressure is controlled to target without the use of antihypertensive medications.  3) Lipids/Hyperlipidemia:  There are no recent lipid panel results to review, nor does he take any medication for high cholesterol.  He reports he has had his cholesterol checked at his PCP, although no records have been sent to [place].   4)  Weight/Diet:  His Body mass index is 30.21 kg/m.  - complicating his diabetes care.   he is a candidate for some weight loss. I discussed  with him the fact that loss of 5 - 10% of his  current body weight will have the most impact on his diabetes management.  Exercise, and detailed carbohydrates information provided  -  detailed on discharge instructions.  5) Chronic Care/Health Maintenance: -he is encouraged to initiate and continue to follow up with Ophthalmology, Dentist,  Podiatrist at least yearly or according to recommendations, and advised to stay away from smoking and/or secondhand smoke exposure. I have recommended yearly flu vaccine and pneumonia vaccine at least every 5 years; moderate intensity exercise for up to 150 minutes weekly; and  sleep for at least 7 hours a day.    - he is advised to maintain close follow up with Elfredia Nevins, MD for primary care needs, as well as his other providers for optimal and coordinated care.  I did encourage him to restart his Allegra daily for allergies as it may be a potential trigger for his recent bronchitis.  He is advised to follow up with his pulmonologist as well.  -I  recommended he lower his dose of Gabapentin to 300 mg po twice daily for safety purposes and prevention of falls.  I did advise him to try OTC Magnesium supplement before bed which may help with some of his symptoms which tend to be worse at night.  I entered referral to neurology for his worsening neuropathy.  He also has history of back problems (used to see pain management in [place] and Dr. Channing Mutters neurosurgeon).  They may be able to do further testing to find out to what degree his neuropathy is affecting him and help come up with safe regimen to treat.      I spent  46  minutes in the care of the patient today including review of labs from CMP, Lipids, Thyroid Function, Hematology (current and previous including abstractions from other facilities); face-to-face time discussing  his blood glucose readings/logs, discussing hypoglycemia and hyperglycemia episodes and symptoms, medications doses, his options of short and long term treatment based on the latest standards of care / guidelines;  discussion about incorporating lifestyle medicine;  and documenting the encounter. Risk reduction counseling performed per USPSTF guidelines to reduce obesity and cardiovascular risk factors.     Please refer to Patient Instructions for Blood Glucose Monitoring and Insulin/Medications Dosing Guide"  in media tab for additional information. Please  also refer to " Patient Self Inventory" in the Media  tab for reviewed elements of pertinent patient history.  [name] participated in the discussions, expressed understanding, and voiced agreement with the above plans.  All questions were answered to his satisfaction. he is encouraged to contact clinic should he have any questions or concerns prior to his return visit.    Follow up plan: - Return in about 4 months (around 10/11/2023) for Diabetes F/U with A1c in office, No previsit labs, Bring meter and logs.  [name], [facility] [address] [place], [place] [address] Phone: [phone] Fax: [phone]  06/10/2023, 3:05 PM

## 2023-06-11 ENCOUNTER — Encounter: Payer: Self-pay | Admitting: Internal Medicine

## 2023-06-11 ENCOUNTER — Ambulatory Visit (HOSPITAL_COMMUNITY)
Admission: RE | Admit: 2023-06-11 | Discharge: 2023-06-11 | Disposition: A | Discharge status: Home / Self Care | Payer: Medicare Other | Source: Ambulatory Visit | Attending: Internal Medicine | Admitting: Internal Medicine

## 2023-06-11 ENCOUNTER — Ambulatory Visit: Payer: Medicare Other | Admitting: Internal Medicine

## 2023-06-11 VITALS — BP 117/70 | HR 78 | Ht 71.0 in | Wt 215.0 lb

## 2023-06-11 DIAGNOSIS — R0609 Other forms of dyspnea: Secondary | ICD-10-CM

## 2023-06-11 DIAGNOSIS — R053 Chronic cough: Secondary | ICD-10-CM

## 2023-06-11 DIAGNOSIS — R918 Other nonspecific abnormal finding of lung field: Secondary | ICD-10-CM | POA: Diagnosis not present

## 2023-06-11 NOTE — Patient Instructions (Signed)
Please remember to go to the  x-ray department  @  [facility] for your tests - we will call you with the results when they are available      If you are satisfied with your treatment plan,  let your doctor know and he/she can either refill your medications or you can return here when your prescription runs out.     If in any way you are not 100% satisfied,  please tell us.  If 100% better, tell your friends!  Pulmonary follow up is as needed

## 2023-06-11 NOTE — Assessment & Plan Note (Signed)
Onset around 2019 assoc neuropathy - Stopped smoking 1994 with only chronic resp symptom p stopped = rhinitis better with allegra - Echo 04/19/20  Nl lv, trivial MR with nl LA and RA  -  06/28/2020   Walked RA  approx   avg ft  @ 600 pace  stopped due to sob with sats still 97% and no orthostatic complaints    - Covid Thxgiving 2023  - 01/21/2023   Walked on RA  x  3  lap(s) =  approx 750  ft  @ mod/avg pace, stopped due to end of study with lowest 02 sats 95% s sob talking entire time  - CAP/ resp failure 05/03/23 > not d/c'd on 02  - Echo 05/04/23 ok  - 05/14/2023   Walked on RA  x  3  lap(s) =  approx 450 ft  @ mod pace, stopped due to end of study with lowest 02 sats 96%  and cough flared but not sob   Really not limited at all by doe at this point though getting more sedentary esp since developed recurrent R radicular pain > w/u in progress.

## 2023-06-11 NOTE — Assessment & Plan Note (Addendum)
Onset with covid Nov 2023 / worse p CAP 05/03/23 no better with saba  - 05/14/2023 rec max gerd/ flutter valve / mucinex dm   Marked improvement > continue gerd rx / diet and elevation of HOB     Each maintenance medication was reviewed in detail including emphasizing most importantly the difference between maintenance and prns and under what circumstances the prns are to be triggered using an action plan format where appropriate.  Total time for H and P, chart review, counseling,   and generating customized AVS unique to this office visit / same day charting = 30 min summary final f/u ov

## 2023-06-13 DIAGNOSIS — A419 Sepsis, unspecified organism: Secondary | ICD-10-CM | POA: Diagnosis not present

## 2023-06-13 DIAGNOSIS — J849 Interstitial pulmonary disease, unspecified: Secondary | ICD-10-CM | POA: Diagnosis not present

## 2023-06-13 DIAGNOSIS — J9601 Acute respiratory failure with hypoxia: Secondary | ICD-10-CM | POA: Diagnosis not present

## 2023-06-13 DIAGNOSIS — J189 Pneumonia, unspecified organism: Secondary | ICD-10-CM | POA: Diagnosis not present

## 2023-06-13 DIAGNOSIS — E119 Type 2 diabetes mellitus without complications: Secondary | ICD-10-CM | POA: Diagnosis not present

## 2023-06-13 DIAGNOSIS — I480 Paroxysmal atrial fibrillation: Secondary | ICD-10-CM | POA: Diagnosis not present

## 2023-06-17 ENCOUNTER — Ambulatory Visit: Payer: Medicare Other | Attending: Cardiology | Admitting: *Deleted

## 2023-06-17 DIAGNOSIS — Z5181 Encounter for therapeutic drug level monitoring: Secondary | ICD-10-CM | POA: Diagnosis not present

## 2023-06-17 DIAGNOSIS — I4891 Unspecified atrial fibrillation: Secondary | ICD-10-CM

## 2023-06-17 LAB — POCT INR: INR: 2.3 (ref 2.0–3.0)

## 2023-06-17 NOTE — Patient Instructions (Signed)
Continue warfarin 1 tablet daily except 1 1/2 tablets on Tuesdays and Fridays Recheck in 4 wks 

## 2023-06-18 DIAGNOSIS — J189 Pneumonia, unspecified organism: Secondary | ICD-10-CM | POA: Diagnosis not present

## 2023-06-18 DIAGNOSIS — A419 Sepsis, unspecified organism: Secondary | ICD-10-CM | POA: Diagnosis not present

## 2023-06-18 DIAGNOSIS — J9601 Acute respiratory failure with hypoxia: Secondary | ICD-10-CM | POA: Diagnosis not present

## 2023-06-18 DIAGNOSIS — J849 Interstitial pulmonary disease, unspecified: Secondary | ICD-10-CM | POA: Diagnosis not present

## 2023-06-18 DIAGNOSIS — E119 Type 2 diabetes mellitus without complications: Secondary | ICD-10-CM | POA: Diagnosis not present

## 2023-06-18 DIAGNOSIS — I480 Paroxysmal atrial fibrillation: Secondary | ICD-10-CM | POA: Diagnosis not present

## 2023-06-25 DIAGNOSIS — E1140 Type 2 diabetes mellitus with diabetic neuropathy, unspecified: Secondary | ICD-10-CM | POA: Diagnosis not present

## 2023-06-25 DIAGNOSIS — J849 Interstitial pulmonary disease, unspecified: Secondary | ICD-10-CM | POA: Diagnosis not present

## 2023-06-25 DIAGNOSIS — E663 Overweight: Secondary | ICD-10-CM | POA: Diagnosis not present

## 2023-06-25 DIAGNOSIS — M47816 Spondylosis without myelopathy or radiculopathy, lumbar region: Secondary | ICD-10-CM | POA: Diagnosis not present

## 2023-06-25 DIAGNOSIS — M5416 Radiculopathy, lumbar region: Secondary | ICD-10-CM | POA: Diagnosis not present

## 2023-06-25 DIAGNOSIS — J449 Chronic obstructive pulmonary disease, unspecified: Secondary | ICD-10-CM | POA: Diagnosis not present

## 2023-06-25 DIAGNOSIS — F320 Major depressive disorder, single episode, mild: Secondary | ICD-10-CM | POA: Diagnosis not present

## 2023-06-25 DIAGNOSIS — Z6829 Body mass index (BMI) 29.0-29.9, adult: Secondary | ICD-10-CM | POA: Diagnosis not present

## 2023-06-27 ENCOUNTER — Encounter: Payer: Self-pay | Admitting: *Deleted

## 2023-06-27 ENCOUNTER — Ambulatory Visit: Payer: Self-pay | Admitting: *Deleted

## 2023-06-28 NOTE — Patient Outreach (Addendum)
Care Coordination   Follow Up Visit Note   06/27/2023 Name: [name] MRN: [id number] DOB: 09-Nov-1941  [place] [name] is a 82 y.o. year old male who sees Elfredia Nevins, MD for primary care. I spoke with  [name] by phone today.  What matters to the patients health and wellness today?  Managing neuropathic pain and low back pain with right leg radiculopathy.    Goals Addressed             This Visit's Progress    Improve Management of Neuropathy   On track    Care Coordination Goals: Patient will continue to take medications as prescribed Toradol injection at PCP visit on 06/25/23. Did not help. Prescribed tramadol and xananx at that visit.  Filled a prescription for Cymbalta that was on file. PCP is aware. Patient will follow-up with provider regarding management of neuropathic pain  Also has low back pain with radiculopathy into right leg Patient will continue non-pharmaceutical pain management strategies: warm compresses Patient will reach out to provider with any new or worsening symptoms Patient will move carefully and change positions slowly to decrease risk for falls Patient will continue to use can for balance Patient will reach out to RN Care Coordinator with any resource or care coordination needs [phone]     Manage Blood Sugar   On track    Care Coordination Goals: Patient will follow-up with endocrinologist every 3 months or as recommended Patient will take medication as prescribed and reach out to provider with any negative side effects Patient will continue to monitor and record blood sugar throughout the day with Dexcom and will pay attention to any alarms alerting him of high or low readings. Patient will call endocrinologist with any readings below 70 or if he has 3 readings in a row above 200 Patient will take blood sugar log and meter to provider visits for review Patient will follow a modified carbohydrate with 3 meals a day that include 30GM  of carbohydrates and up to 2 snacks per day with less than 15GM of carbohydrates Patient will review nutritional handout information provided Patient will reach out to RN Care Coordinator [phone] with any care coordination or resource needs      COMPLETED: Resolve Pneumonia   On track    Care Coordination Goals: Patient will take medications as prescribed Patient will follow-up with pulmonologist as scheduled and sooner if needed Patient will continue to perform physical therapy exercises daily Patient will continue to use incentive spirometer and flutter valve to exercise lungs Patient will reach out to provider with any new or worsening symptoms Patient will reach out to RN Care Coordinator [phone] with any resource or care coordination needs        SDOH assessments and interventions completed:  Yes SDOH Interventions Today    Flowsheet Row Most Recent Value  SDOH Interventions   Transportation Interventions Intervention Not Indicated  Physical Activity Interventions Patient Declined  [unable to increase at this time due to pain]        Care Coordination Interventions:  Yes, provided  Interventions Today    Flowsheet Row Most Recent Value  Chronic Disease   Chronic disease during today's visit Diabetes, Other  [polyneuropathy]  General Interventions   General Interventions Discussed/Reviewed General Interventions Discussed, General Interventions Reviewed, Labs, Durable Medical Equipment (DME), Doctor Visits  Labs Hgb A1c every 3 months  Doctor Visits Discussed/Reviewed Doctor Visits Discussed, Doctor Visits Reviewed, Annual Wellness Visits, PCP, Specialist  [  Saw Dr Sherwood Gambler on 06/25/23. Had toradol injection for back pain with right leg radiculopathy. Injection did not help.]  Durable Medical Equipment (DME) Glucomoter, Other  [cane. Blood sugar today was 158.]  PCP/Specialist Visits Compliance with follow-up visit  [09/13/23 for new patient visit with neurology for  polyneuropathy. 11/25 with endocrinology.]  Exercise Interventions   Exercise Discussed/Reviewed Exercise Discussed, Exercise Reviewed, Physical Activity  Physical Activity Discussed/Reviewed Physical Activity Discussed, Physical Activity Reviewed  [able to perform ADLs. Using cane. Unable to increase activity level at this time due to pain.]  Education Interventions   Education Provided Provided Printed Education, Provided Education  [printed education on diabetes and nutrition]  Provided Verbal Education On Nutrition, When to see the doctor, Foot Care, Eye Care, Labs, Blood Sugar Monitoring, Medication, Exercise  Labs Reviewed Hgb A1c  [06/10/23 A1C 6.9]  Nutrition Interventions   Nutrition Discussed/Reviewed Nutrition Discussed, Nutrition Reviewed, Carbohydrate meal planning, Portion sizes, Decreasing sugar intake, Fluid intake, Adding fruits and vegetables, Increasing proteins  [Decrease nighttime snacking. Watch CHO intake. encouraged 3 meals per day with 30 GM of CHO and up to 2 snacks per day with less than 15 GM of CHO. Combine CHO with a protein and/or healthy fat]  Pharmacy Interventions   Pharmacy Dicussed/Reviewed Pharmacy Topics Discussed, Pharmacy Topics Reviewed, Medications and their functions, Medication Adherence  [takes medications regularly. Prescribed tramadol and xanax at PCP visit on 8/6. Filled an Rx for Cymbalta that was on hold. Discusse antidepressant, anti-anxiety, and pain management properties. Do not stop taking abruptly. Consult provider first.]  Safety Interventions   Safety Discussed/Reviewed Safety Discussed, Safety Reviewed, Fall Risk, Home Safety  Home Safety Assistive Devices       Follow up plan: Follow up call scheduled for 07/12/23    Encounter Outcome:  Pt. Visit Completed   Demetrios Loll, BSN, RN-BC RN Care Coordinator [facility]  [facility] Direct Dial: [phone] Main #: [phone]

## 2023-07-03 DIAGNOSIS — J9601 Acute respiratory failure with hypoxia: Secondary | ICD-10-CM | POA: Diagnosis not present

## 2023-07-03 DIAGNOSIS — J189 Pneumonia, unspecified organism: Secondary | ICD-10-CM | POA: Diagnosis not present

## 2023-07-03 DIAGNOSIS — J849 Interstitial pulmonary disease, unspecified: Secondary | ICD-10-CM | POA: Diagnosis not present

## 2023-07-03 DIAGNOSIS — A419 Sepsis, unspecified organism: Secondary | ICD-10-CM | POA: Diagnosis not present

## 2023-07-03 DIAGNOSIS — I480 Paroxysmal atrial fibrillation: Secondary | ICD-10-CM | POA: Diagnosis not present

## 2023-07-03 DIAGNOSIS — E119 Type 2 diabetes mellitus without complications: Secondary | ICD-10-CM | POA: Diagnosis not present

## 2023-07-12 ENCOUNTER — Ambulatory Visit: Payer: Self-pay | Admitting: *Deleted

## 2023-07-12 ENCOUNTER — Encounter: Payer: Self-pay | Admitting: *Deleted

## 2023-07-12 NOTE — Patient Outreach (Signed)
Care Coordination   Follow Up Visit Note   07/12/2023 Name: [name] MRN: [id number] DOB: [birth year]  [name] is a 82 y.o. year old male who sees [name] Nevins, MD for primary care. I spoke with  [name] by phone today.  What matters to the patients health and wellness today?  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    Goals Addressed             This Visit's Progress    Improve Management of Neuropathy   On track    Care Coordination Goals: Patient will follow-up with provider regarding management of neuropathic pain  Appt. with neurologist scheduled for 09/13/23 and he's on the cancellation waiting list Patient will reach out to provider with any new or worsening symptoms Patient will remain as active as possible and rest as needed Patient will reach out to RN Care Coordinator with any resource or care coordination needs [phone]     Manage Acute Sinus/Nasal Congestion       Care Coordination Goals: Patient will take plain mucinex tablets Patient will increase water intake Patient will use saline nasal spray as often as needed Patient will seek medical attention for new or worsening symptoms or if symptoms do not start to improve after about the 5th day Patient will rest      Manage Blood Sugar   Not on track    Care Coordination Goals: Patient will call endocrinologist with any readings below 70 or if he has 3 readings in a row above 300 Patient will take blood sugar log and meter to provider visits for review Patient will follow a modified carbohydrate with 3 meals a day that include 30GM of carbohydrates and up to 2 snacks per day with less than 15GM of carbohydrates Patient will reference nutritional handout information provided Patient will reach out to RN Care Coordinator [phone] with any care coordination or resource needs         SDOH assessments and interventions completed:  Yes  SDOH Interventions Today     Flowsheet Row Most Recent Value  SDOH Interventions   Food Insecurity Interventions Intervention Not Indicated  Transportation Interventions Intervention Not Indicated        Care Coordination Interventions:  Yes, provided  Interventions Today    Flowsheet Row Most Recent Value  Chronic Disease   Chronic disease during today's visit Diabetes, Other  [polyneuropathy]  General Interventions   General Interventions Discussed/Reviewed General Interventions Discussed, General Interventions Reviewed, Durable Medical Equipment (DME), Doctor Visits  Labs Hgb A1c every 3 months  Durable Medical Equipment (DME) Glucomoter  [cane, continuous glucose meter. Blood sugar was 278 while on the phone with me. No readings less than 70. No readings above 300. May be more elevated than usual due to acute illness.]  PCP/Specialist Visits Compliance with follow-up visit  [09/13/23 with neurologist, 10/14/23 with endocrinologist]  Exercise Interventions   Exercise Discussed/Reviewed Exercise Discussed, Exercise Reviewed, Physical Activity  [patient tries to stay active but is limited by pain and has to stop and rest. Sleeps well at night but often feels tired during the day.]  Physical Activity Discussed/Reviewed Physical Activity Discussed, Physical Activity Reviewed  [able to perform ADLs. Using cane. Unable to increase activity level at this time due to pain.]  Education Interventions   Education Provided Provided Education  Provided Verbal Education On Nutrition, Mental Health/Coping with Illness, Blood Sugar Monitoring, When to see the doctor, Sick Day  Rules  [received Musician. Will use as a reference. Seek medical attention for new, worsening, or persistent sinus symptoms. Rest. Warm mist humidifier may help with sinus syptoms. Monitor blood sugar more frequently while sick.]  Labs Reviewed Hgb A1c  [06/10/23 A1C 6.9]  Mental Health Interventions   Mental Health Discussed/Reviewed  Mental Health Discussed, Mental Health Reviewed  [Therapuetic listening. Patient is frustrated that he physically can't do the things he once could. Encouraged to talk through this someone he's comfortable with for support and LCSW is available if he desires.]  Nutrition Interventions   Nutrition Discussed/Reviewed Nutrition Discussed, Nutrition Reviewed, Fluid intake, Carbohydrate meal planning, Portion sizes, Decreasing sugar intake  [Reiterated decrease nighttime snacking. Watch CHO intake. encouraged 3 meals per day with 30 GM of CHO and up to 2 snacks per day with less than 15 GM of CHO. Combine CHO with a protein and/or healthy fat. Increase water intake, especially while sick]  Pharmacy Interventions   Pharmacy Dicussed/Reviewed Pharmacy Topics Discussed, Pharmacy Topics Reviewed, Medications and their functions  [taking Cymbalta. Reiterated to not stop abruptly if he decides he doesn't want to take it any longer. Plain mucinex and nasal saline for sinus/nasal congestion.]  Safety Interventions   Safety Discussed/Reviewed Safety Discussed, Safety Reviewed, Fall Risk, Home Safety  Home Safety Assistive Devices       Follow up plan: Follow up call scheduled for 07/19/23    Encounter Outcome:  Pt. Visit Completed   Demetrios Loll, BSN, RN-BC RN Care Coordinator [facility]  [facility] Direct Dial: [phone] Main #: [phone]

## 2023-07-15 ENCOUNTER — Ambulatory Visit: Payer: Medicare Other | Attending: Cardiology | Admitting: *Deleted

## 2023-07-15 DIAGNOSIS — I4891 Unspecified atrial fibrillation: Secondary | ICD-10-CM | POA: Insufficient documentation

## 2023-07-15 DIAGNOSIS — Z5181 Encounter for therapeutic drug level monitoring: Secondary | ICD-10-CM | POA: Insufficient documentation

## 2023-07-15 LAB — POCT INR: INR: 2.3 (ref 2.0–3.0)

## 2023-07-15 NOTE — Patient Instructions (Signed)
Continue warfarin 1 tablet daily except 1 1/2 tablets on Tuesdays and Fridays Recheck in 6 wks 

## 2023-07-17 ENCOUNTER — Ambulatory Visit (INDEPENDENT_AMBULATORY_CARE_PROVIDER_SITE_OTHER): Payer: Medicare Other | Admitting: Neurology

## 2023-07-17 ENCOUNTER — Telehealth: Payer: Self-pay | Admitting: Neurology

## 2023-07-17 ENCOUNTER — Encounter: Payer: Self-pay | Admitting: Neurology

## 2023-07-17 VITALS — BP 107/54 | HR 60 | Ht 72.0 in | Wt 217.5 lb

## 2023-07-17 DIAGNOSIS — G5701 Lesion of sciatic nerve, right lower limb: Secondary | ICD-10-CM

## 2023-07-17 DIAGNOSIS — E1142 Type 2 diabetes mellitus with diabetic polyneuropathy: Secondary | ICD-10-CM

## 2023-07-17 DIAGNOSIS — M79604 Pain in right leg: Secondary | ICD-10-CM

## 2023-07-17 DIAGNOSIS — M5416 Radiculopathy, lumbar region: Secondary | ICD-10-CM

## 2023-07-17 DIAGNOSIS — G4719 Other hypersomnia: Secondary | ICD-10-CM | POA: Diagnosis not present

## 2023-07-17 DIAGNOSIS — Z7984 Long term (current) use of oral hypoglycemic drugs: Secondary | ICD-10-CM

## 2023-07-17 MED ORDER — METHOCARBAMOL 500 MG PO TABS: 500.0000 mg | ORAL_TABLET | Freq: Three times a day (TID) | ORAL | 1 refills | Status: DC

## 2023-07-17 NOTE — Telephone Encounter (Signed)
medicare/BCBS sup NPR sent to [facility] [phone] 

## 2023-07-17 NOTE — Progress Notes (Signed)
[facility]  PATIENT: [name] DOB: 1941-04-19  REFERRING DOCTOR OR PCP:   Elfredia Nevins, PCP/Whitney Lurlean Leyden, NP SOURCE: Patient, notes from primary care, imaging and lab results  _________________________________   HISTORICAL  CHIEF COMPLAINT:  Chief Complaint  Patient presents with   Room 10    Pt is here Alone. Pt states that he is having pain on his left side that causes problems with his walking on his right side. Pt is wanting help with his pain.     HISTORY OF PRESENT ILLNESS:  I had the pleasure of seeing your patient, Gerrad Post, at [facility] for neurologic consultation regarding his left leg pain.  He is an 82 year old man with right hip and leg over the past few months.  Pain is radiating down the leg to the foot (entire foot).    Pain is better when he is standing and worse with prolonged sitting.   He denies weakness in the leg.    He has no new numbness but has a long history of foot numbness bilaterally and has been told he has neuropathy.        He has a h/o lumbar radiculopathy in the past and had ESI with some benefit a few times (Dr. Laurian Brim).   Current pain is different.     He is on gabapentin 300 mg po bid.  Even on this dose, he feels sleepy.   He is on duloxetine.     He has had T2NIDDM x 1-2 years.   He snores and has EDS.   He does not want to have a sleep study as he would not use CPAP  Imaging: MRI of the lumbar spine 10/19/2019 showed disc bulging to the left at L3-L4 with left greater than right lateral recess stenosis that could affect the left L4 nerve root.  Moderate foraminal narrowing to the right but the L3 nerve root is probably not compressed.  Also at L4-L5 there is bilateral recess stenosis.  That could affect either L5 nerve root. REVIEW OF SYSTEMS: Constitutional: No fevers, chills, sweats, or change in appetite Eyes: No visual changes, double vision, eye pain Ear, nose and throat: No  hearing loss, ear pain, nasal congestion, sore throat Cardiovascular: No chest pain, palpitations Respiratory:  No shortness of breath at rest or with exertion.   No wheezes GastrointestinaI: No nausea, vomiting, diarrhea, abdominal pain, fecal incontinence Genitourinary:  No dysuria, urinary retention or frequency.  Had hesitancy, helped by tamsulosin Musculoskeletal:  No neck pain.  Back pain as above.  Integumentary: No rash, pruritus, skin lesions Neurological: as above Psychiatric: No depression at this time.  No anxiety Endocrine: No palpitations, diaphoresis, change in appetite, change in weigh or increased thirst Hematologic/Lymphatic:  No anemia, purpura, petechiae. Allergic/Immunologic: No itchy/runny eyes, nasal congestion, recent allergic reactions, rashes  ALLERGIES: Allergies  Allergen Reactions   Prednisone     Patient state that this medication made his blood sugar go real high    HOME MEDICATIONS:  Current Outpatient Medications:    albuterol (VENTOLIN HFA) 108 (90 Base) MCG/ACT inhaler, Inhale 2 puffs into the lungs every 6 (six) hours as needed for wheezing or shortness of breath., Disp: 8 g, Rfl: 2   allopurinol (ZYLOPRIM) 100 MG tablet, Take 100 mg by mouth daily. , Disp: , Rfl:    ALPRAZolam (XANAX) 1 MG tablet, Take 0.5 mg by mouth at bedtime as needed for anxiety or sleep., Disp: , Rfl:    Cholecalciferol (  VITAMIN D3) 50 MCG (2000 UT) TABS, Take 2,000 Units by mouth daily., Disp: , Rfl:    Continuous Glucose Sensor (DEXCOM G7 SENSOR) MISC, by Does not apply route., Disp: , Rfl:    dextromethorphan-guaiFENesin (MUCINEX DM) 30-600 MG 12hr tablet, Take 1 tablet by mouth 2 (two) times daily., Disp: 20 tablet, Rfl: 0   DULoxetine (CYMBALTA) 60 MG capsule, Take 60 mg by mouth daily., Disp: , Rfl:    fexofenadine (ALLEGRA) 180 MG tablet, Take 180 mg by mouth daily., Disp: , Rfl:    finasteride (PROSCAR) 5 MG tablet, TAKE ONE TABLET (5MG  TOTAL) BY MOUTH DAILY (Patient  taking differently: Take 5 mg by mouth daily.), Disp: 90 tablet, Rfl: 3   gabapentin (NEURONTIN) 300 MG capsule, Take 300 mg by mouth 2 (two) times daily. , Disp: , Rfl:    glipiZIDE (GLUCOTROL XL) 5 MG 24 hr tablet, Take 1 tablet (5 mg total) by mouth daily with breakfast., Disp: 90 tablet, Rfl: 1   metFORMIN (GLUCOPHAGE-XR) 500 MG 24 hr tablet, Take 2 tablets (1,000 mg total) by mouth daily with breakfast., Disp: 180 tablet, Rfl: 1   pantoprazole (PROTONIX) 40 MG tablet, TAKE ONE TABLET (40MG  TOTAL) BY MOUTH TWO TIMES DAILY BEFORE A MEAL (Patient taking differently: Take 40 mg by mouth 2 (two) times daily.), Disp: 180 tablet, Rfl: 1   rosuvastatin (CRESTOR) 10 MG tablet, TAKE ONE TABLET (10MG  TOTAL) BY MOUTH DAILY (Patient taking differently: Take 10 mg by mouth daily.), Disp: 90 tablet, Rfl: 1   tamsulosin (FLOMAX) 0.4 MG CAPS capsule, TAKE ONE CAPSULE (0.4MG  TOTAL) BY MOUTH TWO TIMES DAILY (Patient taking differently: Take 0.4 mg by mouth daily.), Disp: 180 capsule, Rfl: 3   traMADol (ULTRAM) 50 MG tablet, Take 1 tablet (50 mg total) by mouth every 6 (six) hours as needed for moderate pain (pain.)., Disp: 12 tablet, Rfl: 0   warfarin (COUMADIN) 5 MG tablet, TAKE ONE TABLET BY MOUTH DAILY EXCEPT TAKE ONE AND ONE-HALF TABLETS ON TUESDAYS AND FRIDAYS OR AS DIRECTED (Patient taking differently: Take 5 mg by mouth See admin instructions. TAKE ONE TABLET BY MOUTH DAILY EXCEPT TAKE ONE AND ONE-HALF TABLETS ON TUESDAYS AND FRIDAYS OR AS DIRECTED), Disp: 100 tablet, Rfl: 1   Accu-Chek FastClix Lancets MISC, 1 application by Does not apply route 2 (two) times daily. (Patient not taking: Reported on 07/17/2023), Disp: 102 each, Rfl: 3   glucose blood test strip, 1 each by Other route daily before breakfast. Use as instructed (Patient not taking: Reported on 07/17/2023), Disp: 100 each, Rfl: 2   nystatin (MYCOSTATIN) 100000 UNIT/ML suspension, Take 5 mLs by mouth 4 (four) times daily., Disp: , Rfl:    ondansetron  (ZOFRAN) 4 MG tablet, Take 1 tablet (4 mg total) by mouth every 12 (twelve) hours as needed for nausea or vomiting. (Patient not taking: Reported on 07/17/2023), Disp: 10 tablet, Rfl: 0   pioglitazone (ACTOS) 30 MG tablet, Take 1 tablet (30 mg total) by mouth daily. (Patient not taking: Reported on 07/17/2023), Disp: 90 tablet, Rfl: 1  PAST MEDICAL HISTORY: Past Medical History:  Diagnosis Date   Aortic atherosclerosis (HCC)    Barrett's esophagus    Short-segment, diagnosed in 2007 by Dr. Jena Gauss   BPH (benign prostatic hyperplasia)    CAD (coronary artery disease)    a. cath on 07/21/2019 showing patent LM and LCx with 40-60% Proximal LAD and 30-40% Pro-RCA stenosis with normal LV function.   Essential hypertension    GERD (gastroesophageal reflux  disease)    Hiatal hernia    History of kidney stones    PAF (paroxysmal atrial fibrillation) (HCC)    S/P colonoscopy 2007   S/P endoscopy 08/2010    Salmon-colored epithelium coming up to 37 cm from the    Type 2 diabetes mellitus (HCC)    Vertigo     PAST SURGICAL HISTORY: Past Surgical History:  Procedure Laterality Date   APPENDECTOMY     BIOPSY  01/28/2020   Procedure: BIOPSY;  Surgeon: Corbin Ade, MD;  Location: [facility];  Service: Endoscopy;;  esophagus   CHOLECYSTECTOMY     COLONOSCOPY  02/06/2006   Dr. Jena Gauss- single anal papilla, o/w normal rectum, normal colon   COLONOSCOPY N/A 09/13/2016   RMR: three 5-6 mm for adenomas removed.  Recommend one last surveillance colonoscopy in October 2020.   COLONOSCOPY WITH PROPOFOL N/A 01/28/2020   Procedure: COLONOSCOPY WITH PROPOFOL;  Surgeon: Corbin Ade, MD;  Location: [facility];  Service: Endoscopy;  Laterality: N/A;  10:45am   CORONARY PRESSURE/FFR STUDY N/A 07/21/2019   Procedure: INTRAVASCULAR PRESSURE WIRE/FFR STUDY;  Surgeon: Lyn Records, MD;  Location: [facility];  Service: Cardiovascular;  Laterality: N/A;   CYSTOSCOPY WITH RETROGRADE PYELOGRAM,  URETEROSCOPY AND STENT PLACEMENT Right 08/18/2020   Procedure: CYSTOSCOPY WITH INCISION OF RIGHT URETEROCELE, URETEROSCOPY WITH HOLMIUM LASER, CYSTOLITHOLAPAXY OF 10 MM BLADDER STONE;  Surgeon: Bjorn Pippin, MD;  Location: [facility];  Service: Urology;  Laterality: Right;   ESOPHAGOGASTRODUODENOSCOPY  09/15/2010   Dr. Jena Gauss- Salmon-colored epithelium coming up to 37 cm from the    ESOPHAGOGASTRODUODENOSCOPY N/A 07/02/2013   Dr. Jena Gauss- Barretts on bx. hiatal hernia   ESOPHAGOGASTRODUODENOSCOPY N/A 09/13/2016   RMR: Barrett's esophagus without dysplasia. one last EGD in 3 years.    ESOPHAGOGASTRODUODENOSCOPY (EGD) WITH PROPOFOL N/A 01/28/2020   Procedure: ESOPHAGOGASTRODUODENOSCOPY (EGD) WITH PROPOFOL;  Surgeon: Corbin Ade, MD;  [facility]: [facility];  Service: Endoscopy;  Laterality: N/A;   LEFT HEART CATH AND CORONARY ANGIOGRAPHY N/A 07/21/2019   Procedure: LEFT HEART CATH AND CORONARY ANGIOGRAPHY;  Surgeon: Lyn Records, MD;  Location: [facility];  Service: Cardiovascular;  Laterality: N/A;   POLYPECTOMY  01/28/2020   Procedure: POLYPECTOMY;  Surgeon: Corbin Ade, MD;  Location: [facility];  Service: Endoscopy;;  colon   ROTATOR CUFF REPAIR     YAG LASER APPLICATION Right 12/20/2014   Procedure: YAG LASER APPLICATION;  Surgeon: Susa Simmonds, MD;  Location: [facility];  Service: Ophthalmology;  Laterality: Right;    FAMILY HISTORY: Family History  Problem Relation Age of Onset   Prostate cancer Father        deceased   Stroke Mother        deceased   Colon cancer Neg Hx     SOCIAL HISTORY: Social History   Socioeconomic History   Marital status: Married    Spouse name: Not on file   Number of children: Not on file   Years of education: Not on file   Highest education level: Not on file  Occupational History   Not on file  Tobacco Use   Smoking status: Former    Current packs/day: 0.00    Average packs/day: 1.5 packs/day for 30.0 years (45.0 ttl pk-yrs)     Types: Cigarettes    Start date: 11/19/1963    Quit date: 11/18/1993    Years since quitting: 29.6   Smokeless tobacco: Never   Tobacco comments:  quit in the 90's  Vaping Use   Vaping status: Never Used  Substance and Sexual Activity   Alcohol use: No   Drug use: No   Sexual activity: Not on file  Other Topics Concern   Not on file  Social History Narrative   Not on file   Social Determinants of Health   Financial Resource Strain: Low Risk  (05/24/2023)   Overall Financial Resource Strain (CARDIA)    Difficulty of Paying Living Expenses: Not very hard  Food Insecurity: No Food Insecurity (07/12/2023)   Hunger Vital Sign    Worried About Running Out of Food in the Last Year: Never true    Ran Out of Food in the Last Year: Never true  Transportation Needs: No Transportation Needs (07/12/2023)   PRAPARE - Administrator, Civil Service (Medical): No    Lack of Transportation (Non-Medical): No  Physical Activity: Inactive (06/27/2023)   Exercise Vital Sign    Days of Exercise per Week: 0 days    Minutes of Exercise per Session: 0 min  Stress: Not on file  Social Connections: Not on file  Intimate Partner Violence: Not At Risk (05/03/2023)   Humiliation, Afraid, Rape, and Kick questionnaire    Fear of Current or Ex-Partner: No    Emotionally Abused: No    Physically Abused: No    Sexually Abused: No       PHYSICAL EXAM  Vitals:   07/17/23 1253  BP: (!) 107/54  Pulse: 60  Weight: 217 lb 8 oz (98.7 kg)  Height: 6' (1.829 m)    Body mass index is 29.5 kg/m.   General: The patient is well-developed and well-nourished and in no acute distress  HEENT:  Head is [place]/AT.  Sclera are anicteric.  Funduscopic exam shows normal optic discs and retinal vessels.  Neck: No carotid bruits are noted.  The neck is nontender.  Cardiovascular: The heart has a regular rate and rhythm with a normal S1 and S2. There were no murmurs, gallops or rubs.    Skin: Extremities  are without rash or  edema.  Musculoskeletal:  Back is tender over the right piriformis muscle.  No tenderness over the lumbar paraspinal muscles. Neurologic Exam  Mental status: The patient is alert and oriented x 3 at the time of the examination. The patient has apparent normal recent and remote memory, with an apparently normal attention span and concentration ability.   Speech is normal.  Cranial nerves: Extraocular movements are full. Pupils are equal, round, and reactive to light and accomodation.  Visual fields are full.  Facial symmetry is present. There is good facial sensation to soft touch bilaterally.Facial strength is normal.  Trapezius and sternocleidomastoid strength is normal. No dysarthria is noted.  The tongue is midline, and the patient has symmetric elevation of the soft palate. No obvious hearing deficits are noted.  Motor:  Muscle bulk is normal.   Tone is normal. Strength is  4+/5 in the right arm and 4-/5 in left APB.   Strength was 5/5 in left arm.  Strength is 5/5 in legs except 4+/5 ankle extension and 4-/5 EHL.  .   Sensory: Sensory testing is intact to pinprick, soft touch and vibration sensation in all 4 extremities.  Coordination: Cerebellar testing reveals good finger-nose-finger and heel-to-shin bilaterally.  Gait and station: Station is normal.   Gait is arthritic and is mildly wide.  He cannot do a tandem gait.. Romberg is borderline..   Reflexes: Deep  tendon reflexes are symmetric and normal in the arms, 2+ at the knees and 1 at the ankles..   Plantar responses are flexor.    DIAGNOSTIC DATA (LABS, IMAGING, TESTING) - I reviewed patient records, labs, notes, testing and imaging myself where available.  Lab Results  Component Value Date   WBC 8.9 05/06/2023   HGB 11.1 (L) 05/06/2023   HCT 34.2 (L) 05/06/2023   MCV 96.3 05/06/2023   PLT 131 (L) 05/06/2023      Component Value Date/Time   NA 138 05/06/2023 0430   NA 141 11/30/2022 1504   K 4.0  05/06/2023 0430   CL 106 05/06/2023 0430   CO2 24 05/06/2023 0430   GLUCOSE 119 (H) 05/06/2023 0430   BUN 14 05/06/2023 0430   BUN 14 11/30/2022 1504   CREATININE 1.13 05/06/2023 0430   CREATININE 1.16 07/03/2019 1343   CALCIUM 8.5 (L) 05/06/2023 0430   PROT 6.1 (L) 05/03/2023 0700   PROT 6.2 11/30/2022 1504   ALBUMIN 3.1 (L) 05/06/2023 0430   ALBUMIN 4.1 11/30/2022 1504   AST 23 05/03/2023 0700   ALT 21 05/03/2023 0700   ALKPHOS 53 05/03/2023 0700   BILITOT 1.2 05/03/2023 0700   BILITOT 0.4 11/30/2022 1504   GFRNONAA >60 05/06/2023 0430   GFRAA >60 08/12/2020 1336   No results found for: "CHOL", "HDL", "LDLCALC", "LDLDIRECT", "TRIG", "CHOLHDL" Lab Results  Component Value Date   HGBA1C 6.9 (A) 06/10/2023   No results found for: "VITAMINB12" Lab Results  Component Value Date   TSH 2.420 05/25/2022       ASSESSMENT AND PLAN  Lumbar radiculopathy - Plan: MR LUMBAR SPINE WO CONTRAST  Piriformis syndrome, right  Pain of right lower extremity  Polyneuropathy due to type 2 diabetes mellitus (HCC)  Excessive daytime sleepiness   In summary, Mr. Pole is an 82 year old man with back pain and right leg pain.  He has tenderness over the piriformis muscle on examination today.  His symptoms are probably either coming from a piriformis syndrome on the right or lumbar radiculopathy due to degenerative changes in the lumbar spine.  We will check an MRI of the lumbar spine.  In 2020 he had degenerative changes at multiple levels and there was some potential for L5 nerve root compression.  I proceeded to do a trigger point injection of the right piriformis muscle using 3 cc Marcaine containing 30 mg Depo-Medrol using sterile technique.  He tolerated the procedure well and there were no complications.  He has had difficulty tolerating gabapentin.  I will add a muscle relaxant to see if he can get some benefit from that.  Based on the results of the MRI, he might benefit from an  epidural steroid injection or referral to surgery.  Will also consider physical therapy.  He also has numbness in his feet and lower legs consistent with diabetic polyneuropathy.  There is both a large and small fiber component.  He feels the gabapentin has helped the dysesthesias that he had in the past.  He will return to see me as needed if there are significant new or worsening neurologic symptoms or based on the results of the studies.  Thank you for asking me to see Mr. Radilla.  Please let me know if I can be of further assistance with him or other patients in the future.   Chanika Byland A. Epimenio Foot, MD, Edwin Cap 07/17/2023, 1:05 PM Certified in Neurology, Clinical Neurophysiology, Sleep Medicine and Neuroimaging  [facility] [address]  [address], [address] [place], [place] [address] [phone]

## 2023-07-19 ENCOUNTER — Ambulatory Visit: Payer: Self-pay | Admitting: *Deleted

## 2023-07-19 ENCOUNTER — Encounter: Payer: Self-pay | Admitting: *Deleted

## 2023-07-19 NOTE — Patient Outreach (Signed)
Care Coordination   Follow Up Visit Note   07/19/2023 Name: [name] MRN: [id number] DOB: 08/22/1941  [name] is a 82 y.o. year old male who sees Elfredia Nevins, MD for primary care. I spoke with  [name] by phone today.  What matters to the patients health and wellness today?  Managing back pain    Goals Addressed             This Visit's Progress    Improve Management of Neuropathy & Back Pain   On track    Care Coordination Goals: Patient will reach out to neurologist with any new or worsening symptoms Patient will pick-up methocarbamol from pharmacy  Patient will take muscle relaxer with caution and will not drive and will practice fall prevention strategies Patient will have MRI of lumbar spine on 08/09/23 Apply ice or heat, whichever feels better, for 20 minutes at a time several times a day for symptom managment Patient will reach out to RN Care Coordinator with any resource or care coordination needs [phone]     Manage Acute Sinus/Nasal Congestion   On track    Care Coordination Goals: Patient will increase water intake Patient will use saline nasal spray as often as needed Patient will seek medical attention for new, worsening, or persistent symptoms Patient will rest Patient can use a warm mist humidifier      Manage Blood Sugar   Not on track    Care Coordination Goals: Patient will follow a modified carbohydrate with 3 meals a day that include 30GM of carbohydrates and up to 2 snacks per day with less than 15GM of carbohydrates Patient will reference nutritional handout information provided Patient will reach out to RN Care Coordinator [phone] with any care coordination or resource needs         SDOH assessments and interventions completed:  Yes SDOH Interventions Today    Flowsheet Row Most Recent Value  SDOH Interventions   Transportation Interventions Intervention Not Indicated  Physical Activity Interventions Patient  Declined       Care Coordination Interventions:  Yes, provided  Interventions Today    Flowsheet Row Most Recent Value  Chronic Disease   Chronic disease during today's visit Diabetes, Other  [polyneuropathy and back pain]  General Interventions   General Interventions Discussed/Reviewed General Interventions Discussed, General Interventions Reviewed, Durable Medical Equipment (DME), Doctor Visits, Sick Day Rules  [Continues to have some sinus congestion and cough. Denies fever or cheers. Does have clear phlegm with yellow streaks at times. Will to seek medical care if symptoms do no resolve over the next few days or if they worsen.]  Doctor Visits Discussed/Reviewed Doctor Visits Discussed, Doctor Visits Reviewed, Specialist  [reviewed and discussed neurology visit and appointment for MRI lumbar spine on 08/09/23. Was given injection of depo medrol 30 mg in piriformis muscle. Aware that steroids can cause elevated blood sugar.]  Durable Medical Equipment (DME) Other  [continuous glucose monitor and cane. Blood sugar readings 199 today and 260 last night.]  PCP/Specialist Visits Compliance with follow-up visit  [name], NP (endocrinologist) on 10/14/23]  Exercise Interventions   Exercise Discussed/Reviewed Exercise Discussed, Exercise Reviewed, Physical Activity  [patient tries to stay active but is limited by pain and has to stop and rest. Sleeps well at night but often feels tired during the day.]  Physical Activity Discussed/Reviewed Physical Activity Discussed, Physical Activity Reviewed  [able to perform ADLs. Using cane. Unable to increase activity level at this time  due to pain]  Education Interventions   Education Provided Provided Education  Provided Verbal Education On Nutrition, When to see the doctor, Sick Day Rules, Medication, Exercise, Blood Sugar Monitoring, Mental Health/Coping with Illness  [Check blood sugar more often while sick. Apply ice and/or heat to back for 20  minutes at a time several times a day for symptom management]  Nutrition Interventions   Nutrition Discussed/Reviewed Nutrition Discussed, Nutrition Reviewed, Portion sizes, Carbohydrate meal planning, Decreasing sugar intake, Fluid intake  [Reiterated decrease nighttime snacking. Watch CHO intake. encouraged 3 meals per day with 30 GM of CHO and up to 2 snacks per day with less than 15 GM of CHO. Combine CHO with a protein and/or healthy fat. Increase water intake, especially while sick]  Pharmacy Interventions   Pharmacy Dicussed/Reviewed Pharmacy Topics Discussed, Pharmacy Topics Reviewed, Medications and their functions  [Methocarbamol (muscle relaxer) is available for pickup at pharmacy. Cautioned that it may cause drowsiness and dizziness.]  Safety Interventions   Safety Discussed/Reviewed Safety Discussed, Safety Reviewed, Fall Risk, Home Safety  [practice fall precautions, especially while taking Methocarbamol. Cautioned to take when he knows he's going to be at home and not driving because it can cause drowsiness or dizziness.]  Home Safety Assistive Devices       Follow up plan: Follow up call scheduled for 08/01/23    Encounter Outcome:  Pt. Visit Completed   Demetrios Loll, RN, BSN RN Care Management Coordinator [facility]  [facility] Direct Dial: [phone] Main #: [phone]

## 2023-08-01 ENCOUNTER — Encounter: Payer: Self-pay | Admitting: *Deleted

## 2023-08-01 ENCOUNTER — Ambulatory Visit: Payer: Self-pay | Admitting: *Deleted

## 2023-08-01 IMAGING — DX DG WRIST COMPLETE 3+V*L*
4 series · 4 of 4 positions shown · non-contrast
Comparison: None Available.

CLINICAL DATA: Fall, wrist injury

EXAM:
LEFT WRIST - COMPLETE 3+ VIEW

[wrist pa]
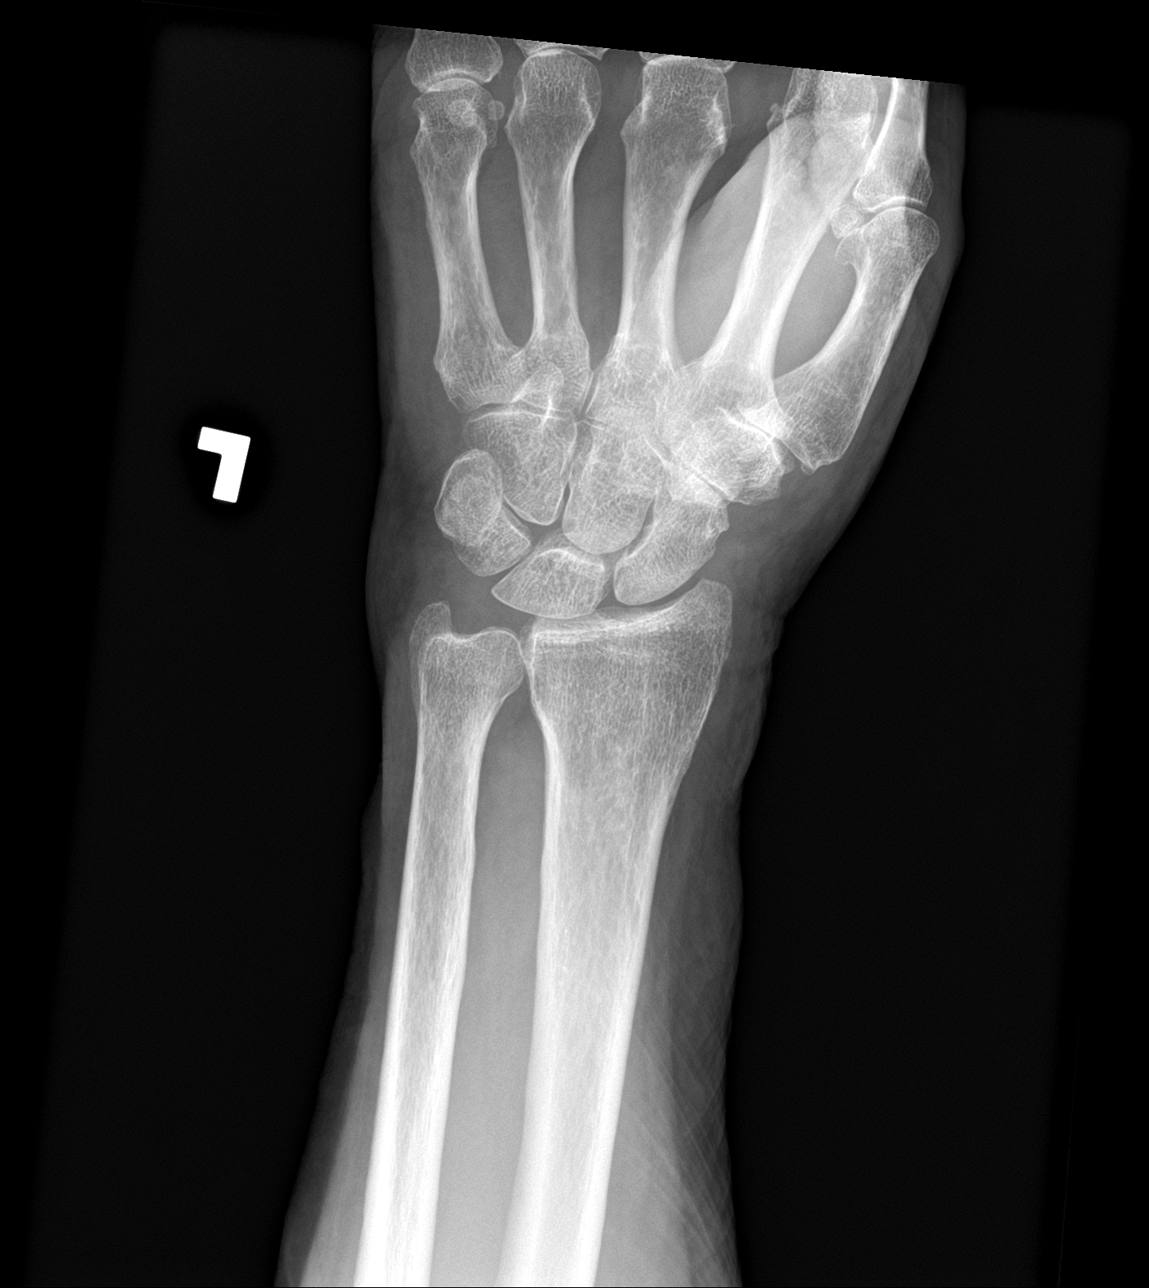

[wrist obl]
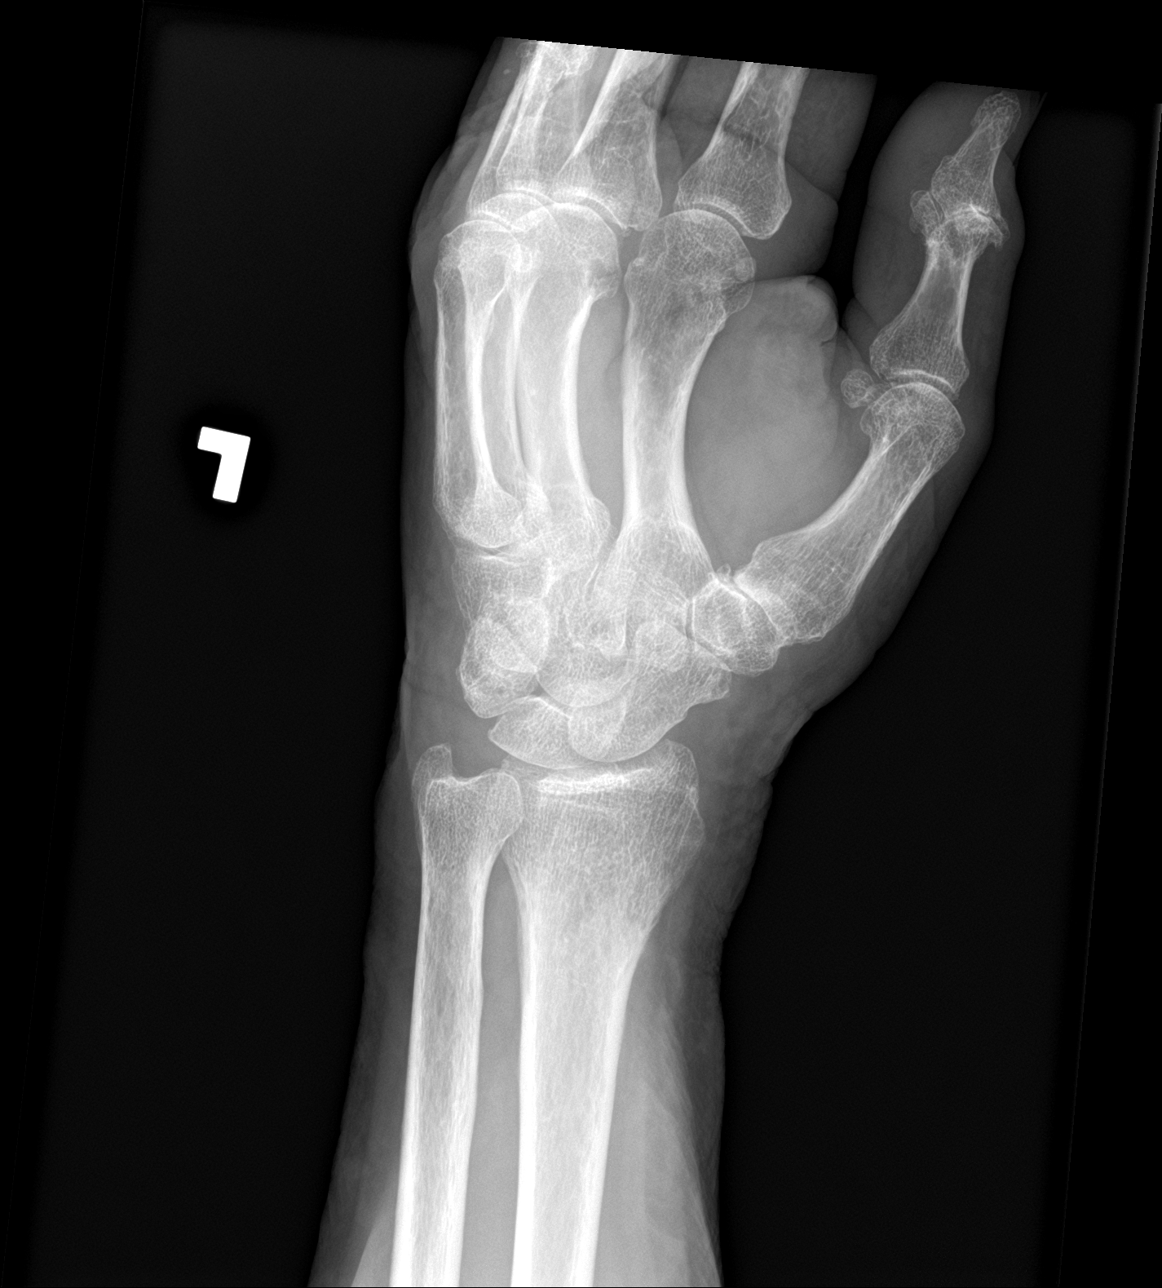

[wrist lat]
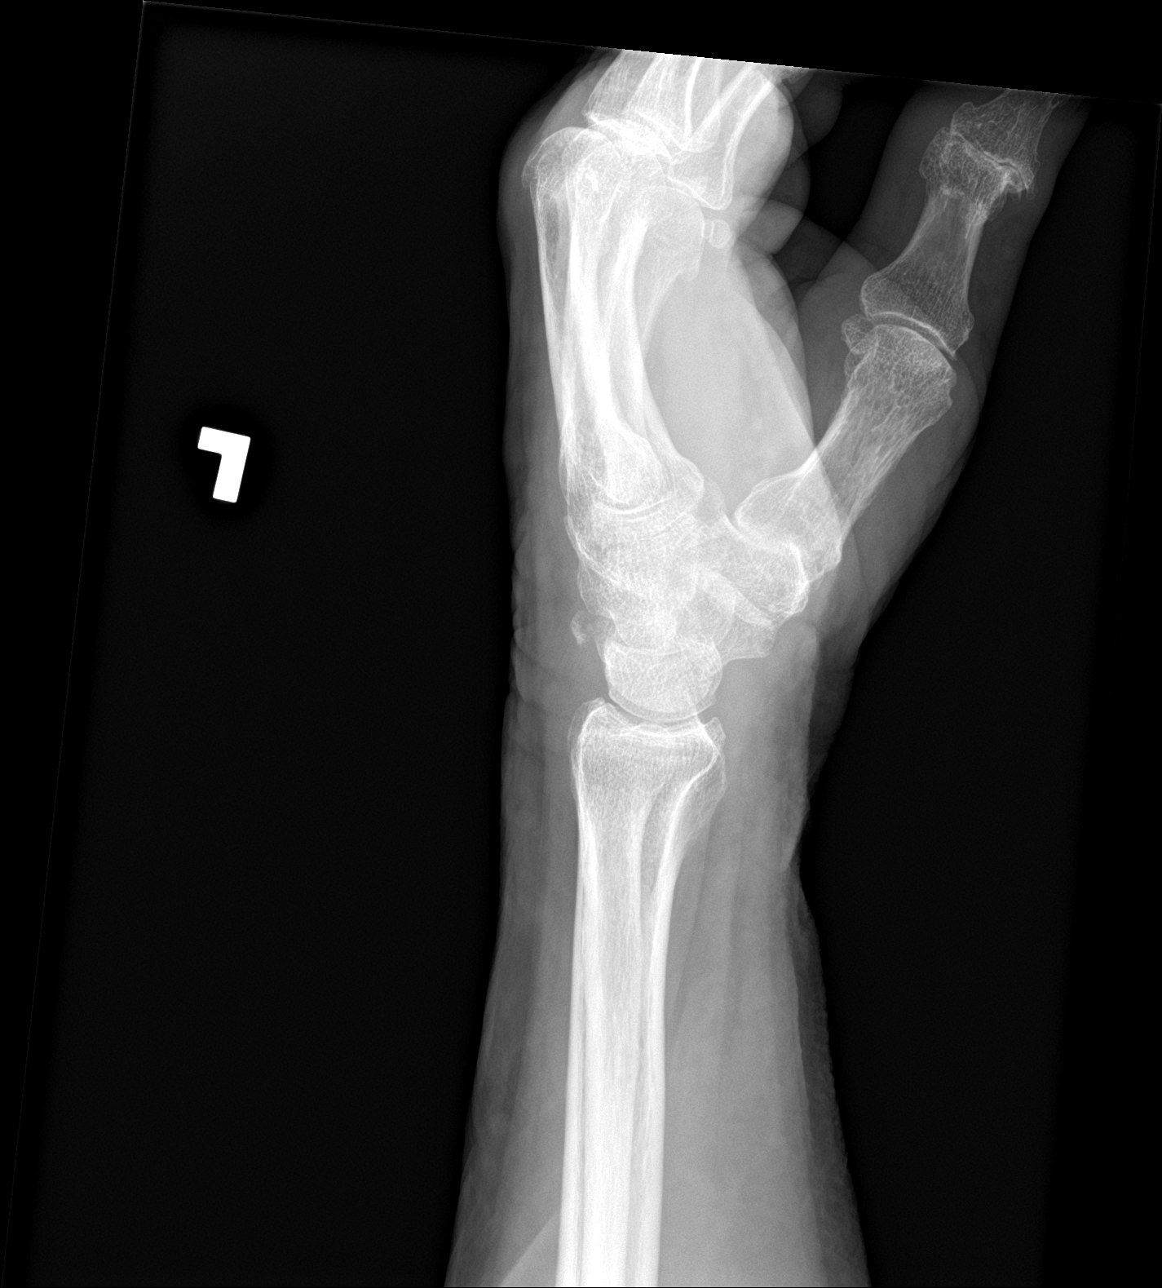

[wrist navicular]
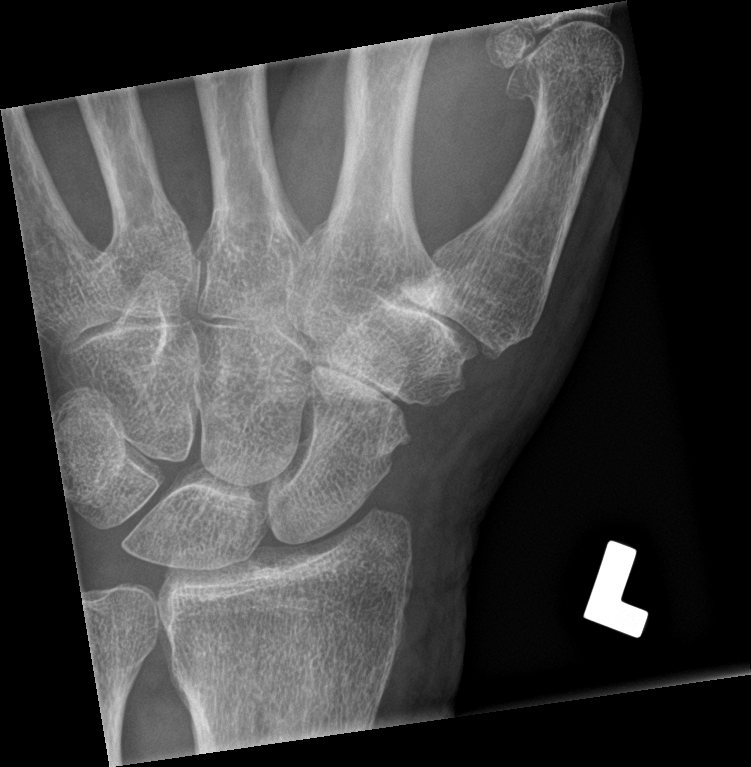

[4 of 4 positions shown; findings below may reference images not displayed]

FINDINGS: Acute triquetral fracture visualized on the lateral view. Mild
degenerative changes at the radial side of the wrist. Soft tissue
swelling of the wrist.
IMPRESSION: Acute triquetral fracture.

## 2023-08-01 IMAGING — CT CT MAXILLOFACIAL W/O CM
3 series · 16 of 47 positions shown, 19 images · non-contrast
Comparison: None Available.

CLINICAL DATA: Fall, facial trauma



[Series 2: max soft · axial · 0.34mm/px · z∈[+1332,+1482]mm · 10 of 87 slices shown, 13 images]
[im 6/87  brain]
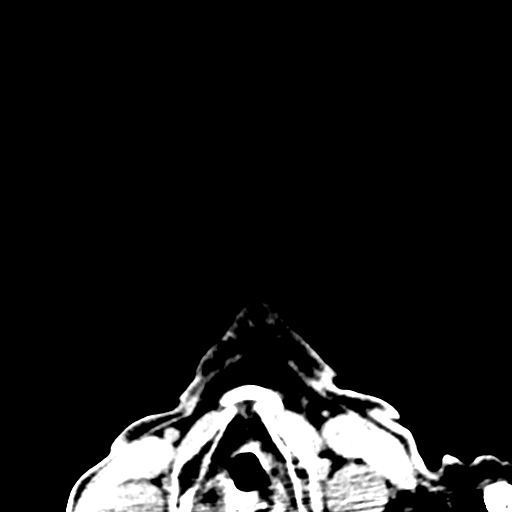
[im 6/87  bone]
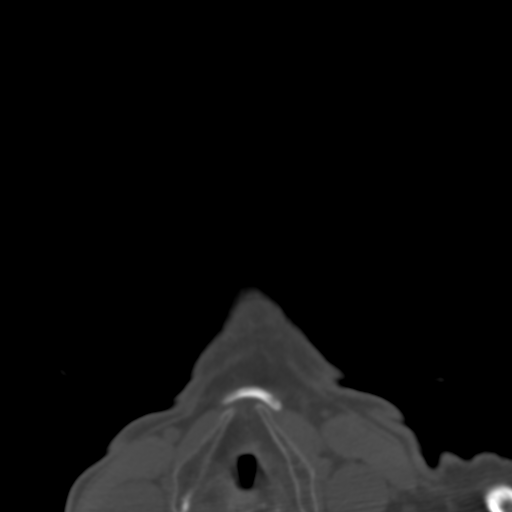
[im 15/87  bone]
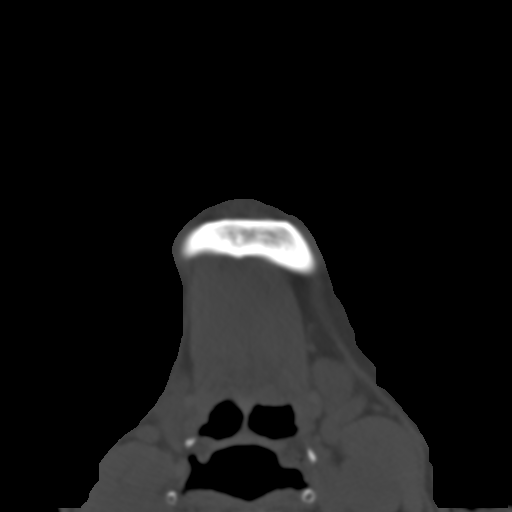
[im 24/87  bone]
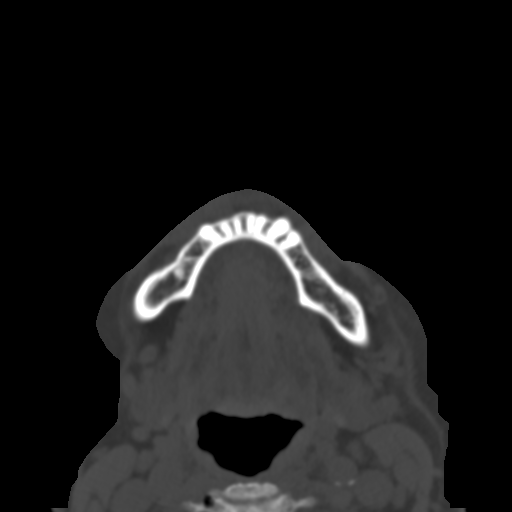
[im 30/87  bone]
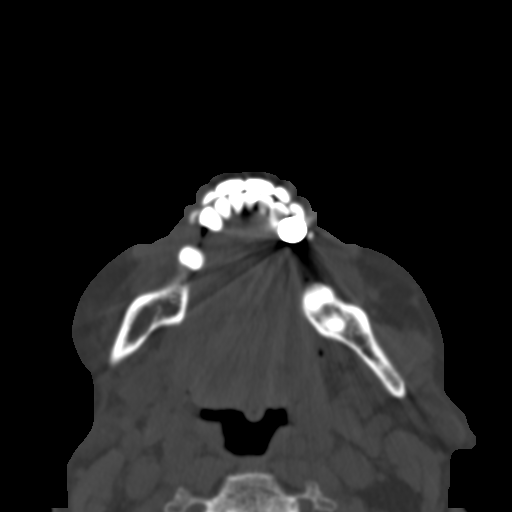
[im 39/87  brain]
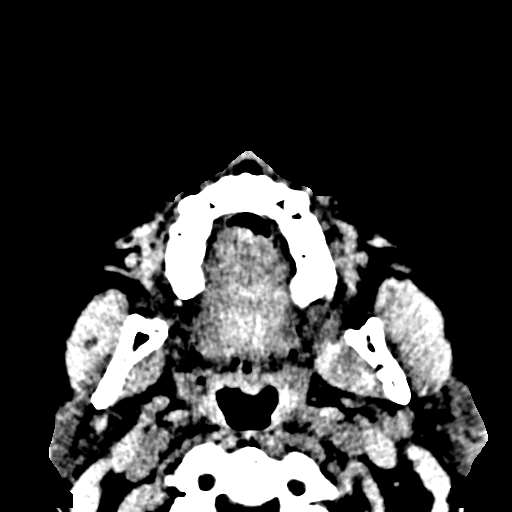
[im 39/87  bone]
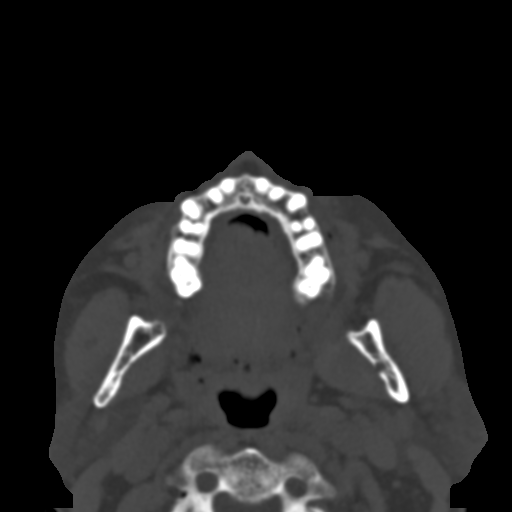
[im 48/87  bone]
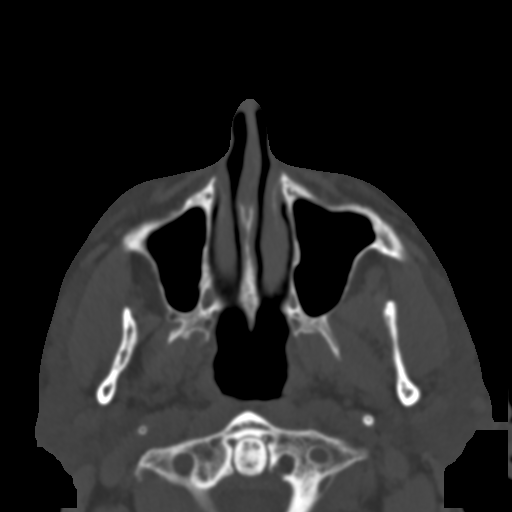
[im 57/87  bone]
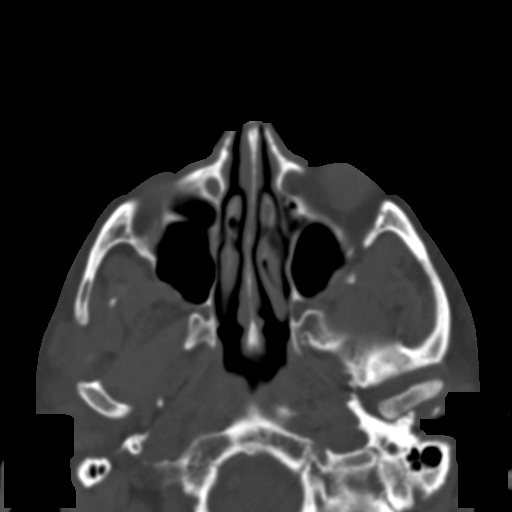
[im 66/87  bone]
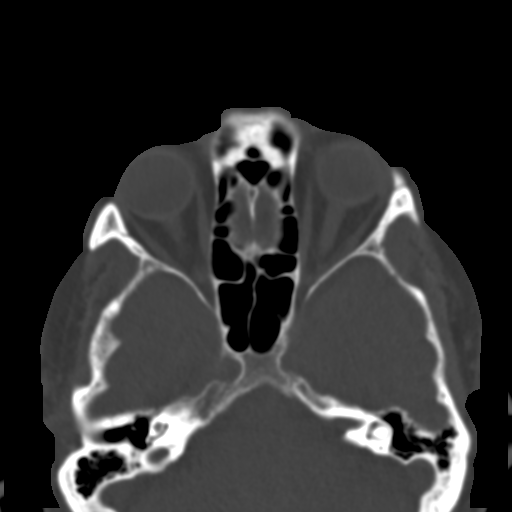
[im 72/87  brain]
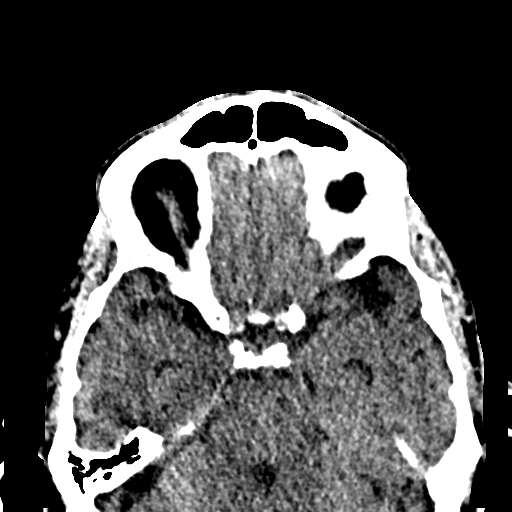
[im 72/87  bone]
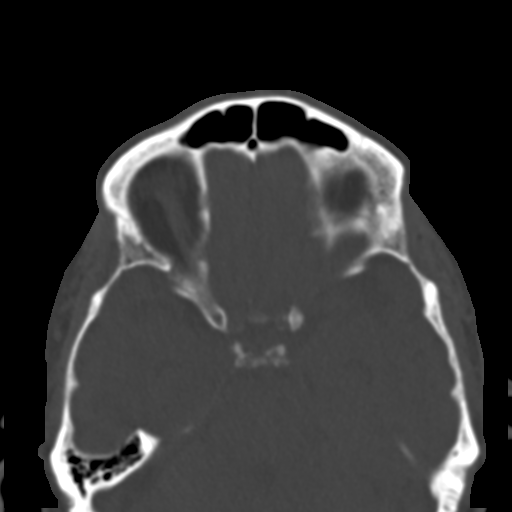
[im 81/87  bone]
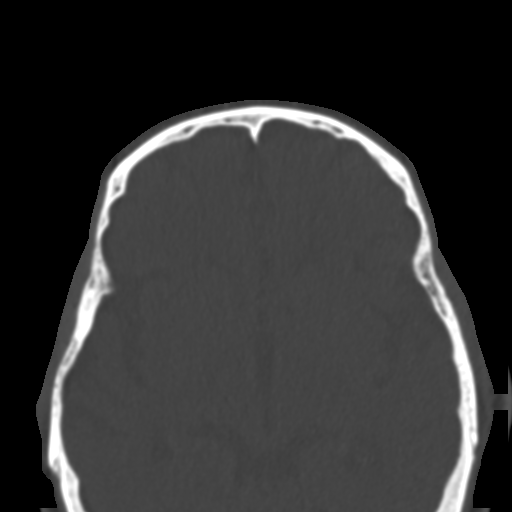

[Series 6: coronal soft · coronal · 0.40mm/px · 3 of 86 slices shown]
[im 29/86  bone]
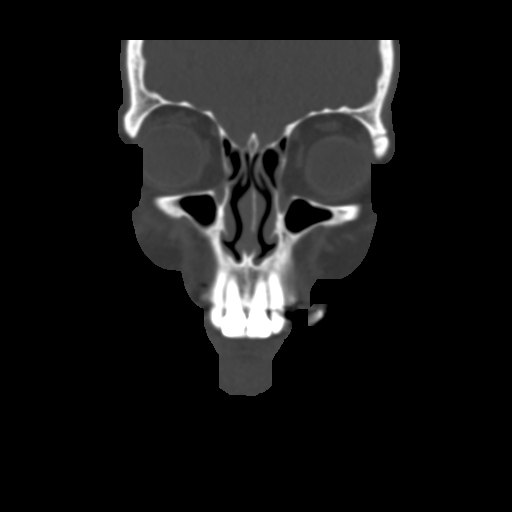
[im 38/86  bone]
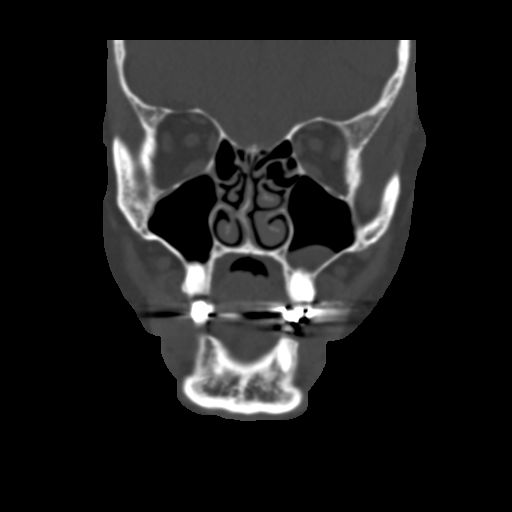
[im 48/86  bone]
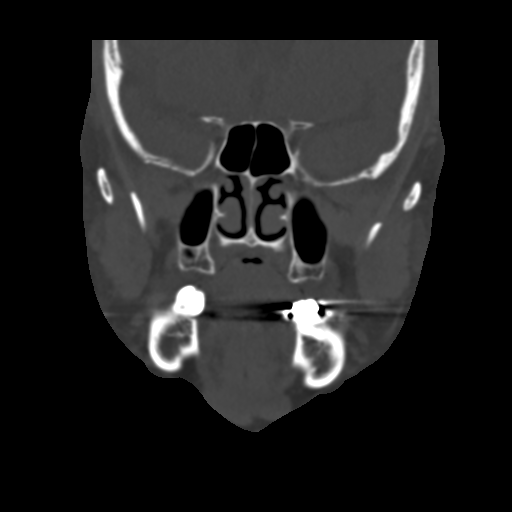

[Series 7: sagittal soft · sagittal · 0.40mm/px · 3 of 102 slices shown]
[im 34/102  bone]
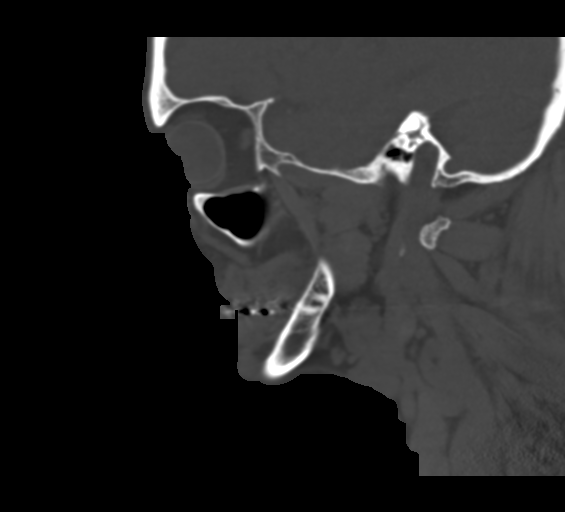
[im 51/102  bone]
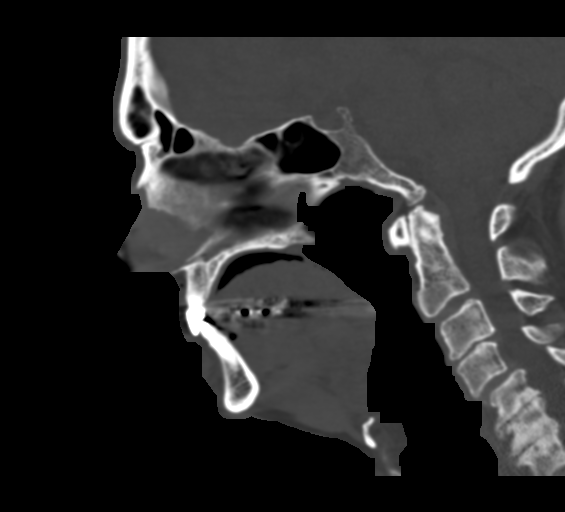
[im 68/102  bone]
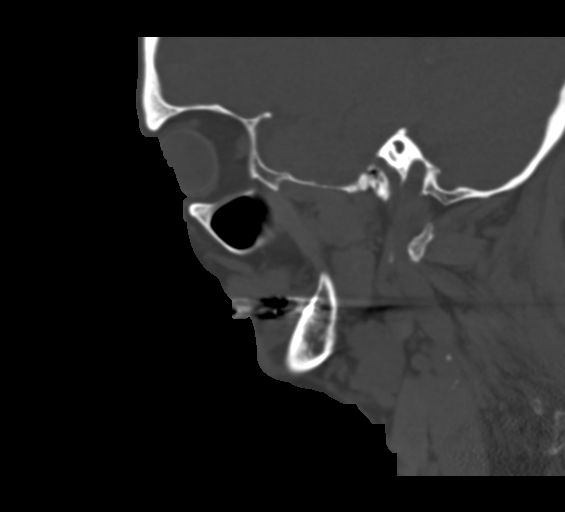

[16 of 47 positions shown; findings below may reference images not displayed]

FINDINGS: Osseous: No fracture or mandibular dislocation. No destructive
process. Note is made of advanced degenerative changes in the
visualized cervical spine.

Orbits: Negative. No traumatic or inflammatory finding.

Sinuses: No air-fluid levels.

Soft tissues: Mild soft tissue swelling in the right maxillary
region.

Limited intracranial: No acute findings.
IMPRESSION: 1. No acute fracture identified.
2. Mild right maxillary soft tissue swelling.

## 2023-08-02 NOTE — Patient Outreach (Signed)
Care Coordination   Follow Up Visit Note   08/01/2023 Name: [name] MRN: [id number] DOB: February 22, 1941  [name] is a 82 y.o. year old male who sees [name] Nevins, MD for primary care. I spoke with  [name] by phone today.  What matters to the patients health and wellness today?  Patient continues to have back pain and has an MRI scheduled. I called patient primarily to follow-up on sinus congestion symptoms.    Goals Addressed             This Visit's Progress    Improve Management of Neuropathy & Back Pain   On track    Care Coordination Goals: Patient will reach out to neurologist with any new or worsening symptoms Patient will continue to take muscle relaxer with caution and will not drive and will practice fall prevention strategies Patient will have MRI of lumbar spine on 08/09/23 Apply ice or heat, whichever feels better, for 20 minutes at a time several times a day for symptom managment Patient will reach out to RN Care Coordinator with any resource or care coordination needs [phone]     COMPLETED: Manage Acute Sinus/Nasal Congestion       Care Coordination Goals: Patient will increase water intake Patient will use saline nasal spray as often as needed Patient will seek medical attention for new, worsening, or persistent symptoms Patient will rest Patient can use a warm mist humidifier         SDOH assessments and interventions completed:  Yes  SDOH Interventions Today    Flowsheet Row Most Recent Value  SDOH Interventions   Transportation Interventions Intervention Not Indicated  Financial Strain Interventions Intervention Not Indicated        Care Coordination Interventions:  No, not indicated  Interventions Today    Flowsheet Row Most Recent Value  Chronic Disease   Chronic disease during today's visit Other, Diabetes  [Polyneuropathy and back pain]  General Interventions   General Interventions Discussed/Reviewed General  Interventions Discussed, General Interventions Reviewed, Durable Medical Equipment (DME)  [Followed up with patient to see if he was still experiencing sinus congestion. Patient reports that symptoms have resolved.]  Durable Medical Equipment (DME) Glucomoter, Other  [continuous glucose monitor. Blood sugar readings were elevated around 200 while sick. Improved some since feeling better. Did not report specific reading today.]  PCP/Specialist Visits Compliance with follow-up visit  [name] Bacon, NP (endocrinologist) on 10/14/23]  Exercise Interventions   Exercise Discussed/Reviewed Physical Activity  Physical Activity Discussed/Reviewed Physical Activity Discussed, Physical Activity Reviewed  Education Interventions   Education Provided Provided Education  Provided Verbal Education On When to see the doctor, Nutrition, Blood Sugar Monitoring, Medication  Nutrition Interventions   Nutrition Discussed/Reviewed Nutrition Discussed, Nutrition Reviewed, Fluid intake, Carbohydrate meal planning, Portion sizes  Pharmacy Interventions   Pharmacy Dicussed/Reviewed Pharmacy Topics Discussed, Pharmacy Topics Reviewed, Medications and their functions  [Taking Methocarbamol (muscle relaxer) at bedtime. Pain is intermittent and he isn't sure if medication is helping.]  Safety Interventions   Safety Discussed/Reviewed Safety Discussed, Safety Reviewed, Fall Risk, Home Safety       Follow up plan: Follow up call scheduled for 09/03/23    Encounter Outcome:  Patient Visit Completed   [name] Loll, RN, BSN Care Management Coordinator [facility]  [facility] Direct Dial: [phone] Main #: [phone]

## 2023-08-06 ENCOUNTER — Ambulatory Visit
Admission: RE | Admit: 2023-08-06 | Discharge: 2023-08-06 | Disposition: A | Discharge status: Home / Self Care | Payer: Medicare Other | Source: Ambulatory Visit | Attending: Neurology | Admitting: Neurology

## 2023-08-06 DIAGNOSIS — M5416 Radiculopathy, lumbar region: Secondary | ICD-10-CM | POA: Diagnosis not present

## 2023-08-09 ENCOUNTER — Other Ambulatory Visit: Payer: Medicare Other

## 2023-08-14 ENCOUNTER — Other Ambulatory Visit: Payer: Self-pay | Admitting: *Deleted

## 2023-08-14 DIAGNOSIS — M5416 Radiculopathy, lumbar region: Secondary | ICD-10-CM

## 2023-08-21 ENCOUNTER — Telehealth: Payer: Self-pay

## 2023-08-21 ENCOUNTER — Telehealth (HOSPITAL_BASED_OUTPATIENT_CLINIC_OR_DEPARTMENT_OTHER): Payer: Self-pay | Admitting: *Deleted

## 2023-08-21 NOTE — Telephone Encounter (Signed)
Pre-operative Risk Assessment    Patient Name: [name]  DOB: 26-Sep-1941 MRN: [id number]      Request for Surgical Clearance    Procedure:   Lumbar Epidural   Date of Surgery:  Clearance TBD                                 Surgeon:  [facility] Surgeon's Group or Practice Name:  [facility] Phone number:  [phone] Fax number:  [phone]   Type of Clearance Requested:   - Medical  - Pharmacy:  Hold Warfarin (Coumadin) 4 days prior   Type of Anesthesia:  Not Indicated   Additional requests/questions:    Signed, Emmit Pomfret   08/21/2023, 7:33 AM

## 2023-08-21 NOTE — Telephone Encounter (Signed)
Pt is scheduled 10/16 at 2:20pm.

## 2023-08-21 NOTE — Telephone Encounter (Signed)
Name: [name]  DOB: [birth year]  MRN: [id number]  Primary Cardiologist: Nona Dell, MD   Preoperative team, please contact this patient and set up a phone call appointment for further preoperative risk assessment. Please obtain consent and complete medication review. Thank you for your help.  I confirm that guidance regarding antiplatelet and oral anticoagulation therapy has been completed and, if necessary, noted below.  Per office protocol, patient can hold warfarin for 5 days prior to procedure. Patient will not need bridging with Lovenox around procedure.   I also confirmed the patient resides in the state of [place]. As per [facility] Medical Board telemedicine laws, the patient must reside in the state in which the provider is licensed.   Napoleon Form, Leodis Rains, NP 08/21/2023, 8:32 AM [facility]

## 2023-08-21 NOTE — Telephone Encounter (Signed)
Patient with diagnosis of afib on warfarin for anticoagulation.    Procedure: lumbar epidural Date of procedure: TBD  CHA2DS2-VASc Score = 5  This indicates a 7.2% annual risk of stroke. The patient's score is based upon: CHF History: 0 HTN History: 1 Diabetes History: 1 Stroke History: 0 Vascular Disease History: 1 Age Score: 2 Gender Score: 0   CrCl 24mL/min using adjusted body weight Platelet count 131K  Per office protocol, patient can hold warfarin for 5 days prior to procedure. Patient will not need bridging with Lovenox around procedure.  **This guidance is not considered finalized until pre-operative APP has relayed final recommendations.**

## 2023-08-21 NOTE — Telephone Encounter (Signed)
Pt is scheduled 10/16 at 2:20pm.     Patient Consent for Virtual Visit        [name] has provided verbal consent on 08/21/2023 for a virtual visit (video or telephone).   CONSENT FOR VIRTUAL VISIT FOR:  [name]  By participating in this virtual visit I agree to the following:  I hereby voluntarily request, consent and authorize [facility] and its employed or contracted physicians, physician assistants, nurse practitioners or other licensed health care professionals (the Practitioner), to provide me with telemedicine health care services (the "Services") as deemed necessary by the treating Practitioner. I acknowledge and consent to receive the Services by the Practitioner via telemedicine. I understand that the telemedicine visit will involve communicating with the Practitioner through live audiovisual communication technology and the disclosure of certain medical information by electronic transmission. I acknowledge that I have been given the opportunity to request an in-person assessment or other available alternative prior to the telemedicine visit and am voluntarily participating in the telemedicine visit.  I understand that I have the right to withhold or withdraw my consent to the use of telemedicine in the course of my care at any time, without affecting my right to future care or treatment, and that the Practitioner or I may terminate the telemedicine visit at any time. I understand that I have the right to inspect all information obtained and/or recorded in the course of the telemedicine visit and may receive copies of available information for a reasonable fee.  I understand that some of the potential risks of receiving the Services via telemedicine include:  Delay or interruption in medical evaluation due to technological equipment failure or disruption; Information transmitted may not be sufficient (e.g. poor resolution of images) to allow for appropriate medical  decision making by the Practitioner; and/or  In rare instances, security protocols could fail, causing a breach of personal health information.  Furthermore, I acknowledge that it is my responsibility to provide information about my medical history, conditions and care that is complete and accurate to the best of my ability. I acknowledge that Practitioner's advice, recommendations, and/or decision may be based on factors not within their control, such as incomplete or inaccurate data provided by me or distortions of diagnostic images or specimens that may result from electronic transmissions. I understand that the practice of medicine is not an exact science and that Practitioner makes no warranties or guarantees regarding treatment outcomes. I acknowledge that a copy of this consent can be made available to me via my patient portal [facility] MyChart), or I can request a printed copy by calling the office of [facility].    I understand that my insurance will be billed for this visit.   I have read or had this consent read to me. I understand the contents of this consent, which adequately explains the benefits and risks of the Services being provided via telemedicine.  I have been provided ample opportunity to ask questions regarding this consent and the Services and have had my questions answered to my satisfaction. I give my informed consent for the services to be provided through the use of telemedicine in my medical care

## 2023-08-21 NOTE — Addendum Note (Signed)
Addended by: Anselm Lis A on: 08/21/2023 10:45 AM   Modules accepted: Orders

## 2023-08-21 NOTE — Telephone Encounter (Signed)
Pharmacy please advise on holding warfarin prior to lumbar ESI scheduled for TBD. Thank you.

## 2023-08-26 ENCOUNTER — Encounter: Payer: Self-pay | Admitting: *Deleted

## 2023-09-03 ENCOUNTER — Encounter: Payer: Self-pay | Admitting: *Deleted

## 2023-09-03 ENCOUNTER — Ambulatory Visit: Payer: Self-pay | Admitting: *Deleted

## 2023-09-03 ENCOUNTER — Ambulatory Visit: Payer: Medicare Other | Attending: Cardiology | Admitting: *Deleted

## 2023-09-03 DIAGNOSIS — I4891 Unspecified atrial fibrillation: Secondary | ICD-10-CM

## 2023-09-03 DIAGNOSIS — Z5181 Encounter for therapeutic drug level monitoring: Secondary | ICD-10-CM | POA: Diagnosis not present

## 2023-09-03 LAB — POCT INR: INR: 3.4 — AB (ref 2.0–3.0)

## 2023-09-03 NOTE — Patient Instructions (Signed)
Take warfarin 1/2 tablet tonight then hold warfarin 5 days for lumbar epidural.  Restart warfarin night of procedure if OK with MD.  Take extra 1/2 tablet of warfarin the first night you start back Recheck in 1 wk after procedure

## 2023-09-04 ENCOUNTER — Ambulatory Visit: Payer: Medicare Other | Attending: Physician Assistant

## 2023-09-04 DIAGNOSIS — Z01810 Encounter for preprocedural cardiovascular examination: Secondary | ICD-10-CM | POA: Diagnosis not present

## 2023-09-04 NOTE — Progress Notes (Signed)
Virtual Visit via Telephone Note   Because of [name]'s co-morbid illnesses, he is at least at moderate risk for complications without adequate follow up.  This format is felt to be most appropriate for this patient at this time.  The patient did not have access to video technology/had technical difficulties with video requiring transitioning to audio format only (telephone).  All issues noted in this document were discussed and addressed.  No physical exam could be performed with this format.  Please refer to the patient's chart for his consent to telehealth for [facility].  Evaluation Performed:  Preoperative cardiovascular risk assessment _____________   Date:  09/04/2023   Patient ID:  [name], DOB [birth year], MRN [id number] Patient Location:  Home Provider location:   Office  Primary Care Provider:  Elfredia Nevins, MD Primary Cardiologist:  Nona Dell, MD  Chief Complaint / Patient Profile   82 y.o. y/o male with a h/o aortic atherosclerosis, CAD status post stenting (last 2020) , hypertension, PAF, and type 2 diabetes mellitus who is pending lumbar epidural injection and presents today for telephonic preoperative cardiovascular risk assessment.  History of Present Illness    [name] is a 82 y.o. male who presents via audio/video conferencing for a telehealth visit today.  Pt was last seen in cardiology clinic on 10/22/2022 by Dr. Diona Browner.  At that time SHERYL JUNG was doing well .  The patient is now pending procedure as outlined above. Since his last visit, he tells me that his older brother just passed away and they buried him on Sunday.  He is dealing with a lot of grief and paperwork and settling his estate.  Luckily, no chest pain or shortness of breath.  He has this lumbar epidural injection scheduled for 10/21.  He recently had his INR checked (yesterday) and it was supratherapeutic at 3.4.  Told to stop his Coumadin.  This is perfect  since he will need to hold it 5 days prior to his procedure.  Recommend resuming as soon as he can post procedure.  He does meet 4 METS on the DASI.   Per office protocol, patient can hold warfarin for 5 days prior to procedure. Patient will not need bridging with Lovenox around procedure.   Past Medical History    Past Medical History:  Diagnosis Date   Aortic atherosclerosis (HCC)    Barrett's esophagus    Short-segment, diagnosed in 2007 by Dr. Jena Gauss   BPH (benign prostatic hyperplasia)    CAD (coronary artery disease)    a. cath on 07/21/2019 showing patent LM and LCx with 40-60% Proximal LAD and 30-40% Pro-RCA stenosis with normal LV function.   Essential hypertension    GERD (gastroesophageal reflux disease)    Hiatal hernia    History of kidney stones    PAF (paroxysmal atrial fibrillation) (HCC)    S/P colonoscopy 2007   S/P endoscopy 08/2010    Salmon-colored epithelium coming up to 37 cm from the    Type 2 diabetes mellitus (HCC)    Vertigo    Past Surgical History:  Procedure Laterality Date   APPENDECTOMY     BIOPSY  01/28/2020   Procedure: BIOPSY;  Surgeon: Corbin Ade, MD;  [facility]: [facility];  Service: Endoscopy;;  esophagus   CHOLECYSTECTOMY     COLONOSCOPY  02/06/2006   Dr. Jena Gauss- single anal papilla, o/w normal rectum, normal colon   COLONOSCOPY N/A 09/13/2016   RMR: three 5-6  mm for adenomas removed.  Recommend one last surveillance colonoscopy in October 2020.   COLONOSCOPY WITH PROPOFOL N/A 01/28/2020   Procedure: COLONOSCOPY WITH PROPOFOL;  Surgeon: Corbin Ade, MD;  Location: [facility];  Service: Endoscopy;  Laterality: N/A;  10:45am   CORONARY PRESSURE/FFR STUDY N/A 07/21/2019   Procedure: INTRAVASCULAR PRESSURE WIRE/FFR STUDY;  Surgeon: Lyn Records, MD;  Location: [facility];  Service: Cardiovascular;  Laterality: N/A;   CYSTOSCOPY WITH RETROGRADE PYELOGRAM, URETEROSCOPY AND STENT PLACEMENT Right 08/18/2020   Procedure: CYSTOSCOPY  WITH INCISION OF RIGHT URETEROCELE, URETEROSCOPY WITH HOLMIUM LASER, CYSTOLITHOLAPAXY OF 10 MM BLADDER STONE;  Surgeon: Bjorn Pippin, MD;  Location: [facility];  Service: Urology;  Laterality: Right;   ESOPHAGOGASTRODUODENOSCOPY  09/15/2010   Dr. Jena Gauss- Salmon-colored epithelium coming up to 37 cm from the    ESOPHAGOGASTRODUODENOSCOPY N/A 07/02/2013   Dr. Jena Gauss- Barretts on bx. hiatal hernia   ESOPHAGOGASTRODUODENOSCOPY N/A 09/13/2016   RMR: Barrett's esophagus without dysplasia. one last EGD in 3 years.    ESOPHAGOGASTRODUODENOSCOPY (EGD) WITH PROPOFOL N/A 01/28/2020   Procedure: ESOPHAGOGASTRODUODENOSCOPY (EGD) WITH PROPOFOL;  Surgeon: Corbin Ade, MD;  [facility];  Service: Endoscopy;  Laterality: N/A;   LEFT HEART CATH AND CORONARY ANGIOGRAPHY N/A 07/21/2019   Procedure: LEFT HEART CATH AND CORONARY ANGIOGRAPHY;  Surgeon: Lyn Records, MD;  [facility]: [facility];  Service: Cardiovascular;  Laterality: N/A;   POLYPECTOMY  01/28/2020   Procedure: POLYPECTOMY;  Surgeon: Corbin Ade, MD;  [facility];  Service: Endoscopy;;  colon   ROTATOR CUFF REPAIR     YAG LASER APPLICATION Right 12/20/2014   Procedure: YAG LASER APPLICATION;  Surgeon: Susa Simmonds, MD;  Location: [facility];  Service: Ophthalmology;  Laterality: Right;    Allergies  Allergies  Allergen Reactions   Prednisone     Patient state that this medication made his blood sugar go real high    Home Medications    Prior to Admission medications   Medication Sig Start Date End Date Taking? Authorizing Provider  allopurinol (ZYLOPRIM) 100 MG tablet Take 100 mg by mouth daily.  02/12/19   [provider]  ALPRAZolam Prudy Feeler) 1 MG tablet Take 0.5 mg by mouth at bedtime as needed for anxiety or sleep.    [provider]  Cholecalciferol (VITAMIN D3) 50 MCG (2000 UT) TABS Take 2,000 Units by mouth daily.    [provider]  Continuous Glucose Sensor (DEXCOM G7 SENSOR) MISC by  Does not apply route.    [provider]  DULoxetine (CYMBALTA) 60 MG capsule Take 60 mg by mouth daily. 10/20/20   [provider]  fexofenadine (ALLEGRA) 180 MG tablet Take 180 mg by mouth daily.    [provider]  finasteride (PROSCAR) 5 MG tablet TAKE ONE TABLET (5MG  TOTAL) BY MOUTH DAILY Patient taking differently: Take 5 mg by mouth daily. 03/21/23   Bjorn Pippin, MD  gabapentin (NEURONTIN) 300 MG capsule Take 300 mg by mouth 2 (two) times daily.     [provider]  glipiZIDE (GLUCOTROL XL) 5 MG 24 hr tablet Take 1 tablet (5 mg total) by mouth daily with breakfast. 06/10/23   Dani Gobble, NP  metFORMIN (GLUCOPHAGE-XR) 500 MG 24 hr tablet Take 2 tablets (1,000 mg total) by mouth daily with breakfast. 06/10/23   Dani Gobble, NP  methocarbamol (ROBAXIN) 500 MG tablet Take 1 tablet (500 mg total) by mouth 3 (three) times daily.  07/17/23   Sater, Pearletha Furl, MD  nystatin (MYCOSTATIN) 100000 UNIT/ML suspension Take 5 mLs by mouth 4 (four) times daily. 03/21/23   [provider]  pantoprazole (PROTONIX) 40 MG tablet TAKE ONE TABLET (40MG  TOTAL) BY MOUTH TWO TIMES DAILY BEFORE A MEAL Patient taking differently: Take 40 mg by mouth 2 (two) times daily. 03/27/23   Rourk, Gerrit Friends, MD  tamsulosin (FLOMAX) 0.4 MG CAPS capsule TAKE ONE CAPSULE (0.4MG  TOTAL) BY MOUTH TWO TIMES DAILY Patient taking differently: Take 0.4 mg by mouth daily. 03/04/23   Bjorn Pippin, MD  traMADol (ULTRAM) 50 MG tablet Take 1 tablet (50 mg total) by mouth every 6 (six) hours as needed for moderate pain (pain.). 08/18/20   Bjorn Pippin, MD  warfarin (COUMADIN) 5 MG tablet TAKE ONE TABLET BY MOUTH DAILY EXCEPT TAKE ONE AND ONE-HALF TABLETS ON TUESDAYS AND FRIDAYS OR AS DIRECTED Patient taking differently: Take 5 mg by mouth See admin instructions. TAKE ONE TABLET BY MOUTH DAILY EXCEPT TAKE ONE AND ONE-HALF TABLETS ON TUESDAYS AND FRIDAYS OR AS DIRECTED 05/01/23   Jonelle Sidle, MD     Physical Exam    Vital Signs:  [name] does not have vital signs available for review today.  Given telephonic nature of communication, physical exam is limited. AAOx3. NAD. Normal affect.  Speech and respirations are unlabored.  Accessory Clinical Findings    None  Assessment & Plan    1.  Preoperative Cardiovascular Risk Assessment:  Mr. Nazario perioperative risk of a major cardiac event is 6.6% according to the Revised Cardiac Risk Index (RCRI).  Therefore, he is at high risk for perioperative complications.   His functional capacity is good at 6.36 METs according to the Duke Activity Status Index (DASI). Recommendations: According to ACC/AHA guidelines, no further cardiovascular testing needed.  The patient may proceed to surgery at acceptable risk.   Antiplatelet and/or Anticoagulation Recommendations:  Coumadin can by held for 5 days prior to surgery.  Please resume post op when felt to be safe.   The patient was advised that if he develops new symptoms prior to surgery to contact our office to arrange for a follow-up visit, and he verbalized understanding.   A copy of this note will be routed to requesting surgeon.  Time:   Today, I have spent 14 minutes with the patient with telehealth technology discussing medical history, symptoms, and management plan.     Sharlene Dory, PA-C  09/04/2023, 1:31 PM

## 2023-09-06 NOTE — Discharge Instructions (Signed)
Post Procedure Spinal Discharge Instruction Sheet  You may resume a regular diet and any medications that you routinely take (including pain medications) unless otherwise noted by MD.  No driving day of procedure.  Light activity throughout the rest of the day.  Do not do any strenuous work, exercise, bending or lifting.  The day following the procedure, you can resume normal physical activity but you should refrain from exercising or physical therapy for at least three days thereafter.  You may apply ice to the injection site, 20 minutes on, 20 minutes off, as needed. Do not apply ice directly to skin.    Common Side Effects:  Headaches- take your usual medications as directed by your physician.  Increase your fluid intake.  Caffeinated beverages may be helpful.  Lie flat in bed until your headache resolves.  Restlessness or inability to sleep- you may have trouble sleeping for the next few days.  Ask your referring physician if you need any medication for sleep.  Facial flushing or redness- should subside within a few days.  Increased pain- a temporary increase in pain a day or two following your procedure is not unusual.  Take your pain medication as prescribed by your referring physician.  Leg cramps  Please contact our office at [phone] for the following symptoms: Fever greater than 100 degrees. Headaches unresolved with medication after 2-3 days. Increased swelling, pain, or redness at injection site.   Thank you for visiting [facility] today.   YOU MAY RESUME YOUR COUMADIN PER YOUR MD

## 2023-09-09 ENCOUNTER — Ambulatory Visit
Admission: RE | Admit: 2023-09-09 | Discharge: 2023-09-09 | Disposition: A | Discharge status: Home / Self Care | Payer: Medicare Other | Source: Ambulatory Visit | Attending: Neurology | Admitting: Neurology

## 2023-09-09 DIAGNOSIS — M5416 Radiculopathy, lumbar region: Secondary | ICD-10-CM

## 2023-09-09 DIAGNOSIS — M4726 Other spondylosis with radiculopathy, lumbar region: Secondary | ICD-10-CM | POA: Diagnosis not present

## 2023-09-09 DIAGNOSIS — M48061 Spinal stenosis, lumbar region without neurogenic claudication: Secondary | ICD-10-CM | POA: Diagnosis not present

## 2023-09-09 MED ORDER — IOPAMIDOL (ISOVUE-M 200) INJECTION 41%
1.0000 mL | Freq: Once | INTRAMUSCULAR | Status: AC
  Administered 2023-09-09: 1 mL via EPIDURAL

## 2023-09-09 MED ORDER — METHYLPREDNISOLONE ACETATE 40 MG/ML INJ SUSP (RADIOLOG
80.0000 mg | Freq: Once | INTRAMUSCULAR | Status: AC
  Administered 2023-09-09: 80 mg via EPIDURAL

## 2023-09-13 ENCOUNTER — Ambulatory Visit: Payer: Medicare Other | Admitting: Diagnostic Neuroimaging

## 2023-09-17 ENCOUNTER — Encounter: Payer: Self-pay | Admitting: *Deleted

## 2023-09-17 NOTE — Patient Outreach (Signed)
Care Coordination   Follow Up Visit Note   09/03/2023 Name: [name] MRN: [id number] DOB: 10-26-1941  [name] is a 82 y.o. year old male who sees Elfredia Nevins, MD for primary care. I spoke with  [name] by phone today.  What matters to the patients health and wellness today?  Managing back pain    Goals Addressed             This Visit's Progress    COMPLETED: Improve Management of Neuropathy & Back Pain   On track    Care Coordination Goals: Patient will reach out to neurologist with any new or worsening symptoms Patient will continue to take muscle relaxer with caution and will not drive and will practice fall prevention strategies Apply ice or heat, whichever feels better, for 20 minutes at a time several times a day for symptom managment Patient will reach out to RN Care Coordinator with any resource or care coordination needs [phone]  Patient does not have any acute or urgent needs related to this goal and will follow-up with PCP regarding management.      COMPLETED: Manage Blood Sugar   Not on track    Care Coordination Goals: Patient will follow a modified carbohydrate with 3 meals a day that include 30GM of carbohydrates and up to 2 snacks per day with less than 15GM of carbohydrates Patient will reference nutritional handout information provided Patient will use Dexcom G7 to monitor blood sugar twice a day and as needed Patient will keep appointment with endocrinologist on 10/14/23 Patient will meter to endocrinology visit for review Patient will reach out to RN Care Coordinator [phone] with any care coordination or resource needs  Patient does not have any acute or urgent needs related to this goal and will follow-up with PCP regarding management.          SDOH assessments and interventions completed:  Yes  SDOH Interventions Today    Flowsheet Row Most Recent Value  SDOH Interventions   Housing Interventions Intervention  Not Indicated  Transportation Interventions Intervention Not Indicated        Care Coordination Interventions:  Yes, provided  Interventions Today    Flowsheet Row Most Recent Value  Chronic Disease   Chronic disease during today's visit Diabetes, Other  [Chronic back pain]  General Interventions   General Interventions Discussed/Reviewed General Interventions Discussed, General Interventions Reviewed, Doctor Visits, Durable Medical Equipment (DME)  Doctor Visits Discussed/Reviewed Doctor Visits Discussed, Doctor Visits Reviewed, PCP, Annual Wellness Visits, Specialist  Durable Medical Equipment (DME) Glucomoter  PCP/Specialist Visits Compliance with follow-up visit  [Follow-up with PCP as planned and as needed for any care management needs. 10/14/23 with [name], NP (endocrinology)]  Education Interventions   Education Provided Provided Education  Provided Verbal Education On Blood Sugar Monitoring, When to see the doctor  Safety Interventions   Safety Discussed/Reviewed Safety Discussed, Safety Reviewed, Fall Risk, Home Safety  Home Safety Assistive Devices       Follow up plan: No further intervention required. Patient's Primary Care office is not partnering with the [facility] for care management and will be providing Care Management Services themselves. This final Care Management note will be securely faxed to the PCP office for handoff. Patient has been encouraged to reach out to their PCP office with any resource or care management needs.    Encounter Outcome:  Patient Visit Completed   Demetrios Loll, RN, BSN Care Management Coordinator [facility]  [facility]  Network Direct Dial: [phone] Main #: [phone]

## 2023-09-18 ENCOUNTER — Ambulatory Visit: Payer: Medicare Other | Attending: Cardiology | Admitting: *Deleted

## 2023-09-18 DIAGNOSIS — Z5181 Encounter for therapeutic drug level monitoring: Secondary | ICD-10-CM | POA: Diagnosis not present

## 2023-09-18 DIAGNOSIS — I4891 Unspecified atrial fibrillation: Secondary | ICD-10-CM | POA: Diagnosis not present

## 2023-09-18 LAB — POCT INR: INR: 2.2 (ref 2.0–3.0)

## 2023-09-18 NOTE — Patient Instructions (Signed)
Continue warfarin 1 tablet daily except 1 1/2 tablets on Tuesdays and Friday Recheck in 4 wks

## 2023-09-30 DIAGNOSIS — Z23 Encounter for immunization: Secondary | ICD-10-CM | POA: Diagnosis not present

## 2023-10-02 ENCOUNTER — Other Ambulatory Visit: Payer: Self-pay | Admitting: Cardiology

## 2023-10-14 ENCOUNTER — Ambulatory Visit (INDEPENDENT_AMBULATORY_CARE_PROVIDER_SITE_OTHER): Payer: Medicare Other | Admitting: Nurse Practitioner

## 2023-10-14 ENCOUNTER — Encounter: Payer: Self-pay | Admitting: Nurse Practitioner

## 2023-10-14 VITALS — BP 104/66 | HR 90 | Ht 72.0 in | Wt 223.8 lb

## 2023-10-14 DIAGNOSIS — Z7984 Long term (current) use of oral hypoglycemic drugs: Secondary | ICD-10-CM

## 2023-10-14 DIAGNOSIS — G6289 Other specified polyneuropathies: Secondary | ICD-10-CM

## 2023-10-14 DIAGNOSIS — E1165 Type 2 diabetes mellitus with hyperglycemia: Secondary | ICD-10-CM

## 2023-10-14 DIAGNOSIS — R5383 Other fatigue: Secondary | ICD-10-CM | POA: Diagnosis not present

## 2023-10-14 DIAGNOSIS — E559 Vitamin D deficiency, unspecified: Secondary | ICD-10-CM

## 2023-10-14 LAB — POCT GLYCOSYLATED HEMOGLOBIN (HGB A1C): Hemoglobin A1C: 7 % — AB (ref 4.0–5.6)

## 2023-10-14 MED ORDER — METFORMIN HCL ER 500 MG PO TB24: 1000.0000 mg | ORAL_TABLET | Freq: Every day | ORAL | 3 refills | Status: DC

## 2023-10-14 MED ORDER — GLIPIZIDE ER 5 MG PO TB24: 5.0000 mg | ORAL_TABLET | Freq: Every day | ORAL | 1 refills | Status: DC

## 2023-10-14 MED ORDER — PIOGLITAZONE HCL 30 MG PO TABS: 30.0000 mg | ORAL_TABLET | Freq: Every day | ORAL | 3 refills | Status: DC

## 2023-10-14 NOTE — Progress Notes (Signed)
Endocrinology Follow Up Visit      10/14/2023, 2:19 PM      Subjective:    Patient ID: [name], male    DOB: 08/13/1941.  [name] is being seen in follow up after being seen in consultation for management of currently uncontrolled symptomatic diabetes requested by  Elfredia Nevins, MD.  He is a retired Emergency planning/management officer for [employer].   Past Medical History:  Diagnosis Date   Aortic atherosclerosis (HCC)    Barrett's esophagus    Short-segment, diagnosed in 2007 by Dr. Jena Gauss   BPH (benign prostatic hyperplasia)    CAD (coronary artery disease)    a. cath on 07/21/2019 showing patent LM and LCx with 40-60% Proximal LAD and 30-40% Pro-RCA stenosis with normal LV function.   Essential hypertension    GERD (gastroesophageal reflux disease)    Hiatal hernia    History of kidney stones    PAF (paroxysmal atrial fibrillation) (HCC)    S/P colonoscopy 2007   S/P endoscopy 08/2010    Salmon-colored epithelium coming up to 37 cm from the    Type 2 diabetes mellitus (HCC)    Vertigo     Past Surgical History:  Procedure Laterality Date   APPENDECTOMY     BIOPSY  01/28/2020   Procedure: BIOPSY;  Surgeon: Corbin Ade, MD;  Location: [facility];  Service: Endoscopy;;  esophagus   CHOLECYSTECTOMY     COLONOSCOPY  02/06/2006   Dr. Jena Gauss- single anal papilla, o/w normal rectum, normal colon   COLONOSCOPY N/A 09/13/2016   RMR: three 5-6 mm for adenomas removed.  Recommend one last surveillance colonoscopy in October 2020.   COLONOSCOPY WITH PROPOFOL N/A 01/28/2020   Procedure: COLONOSCOPY WITH PROPOFOL;  Surgeon: Corbin Ade, MD;  Location: [facility];  Service: Endoscopy;  Laterality: N/A;  10:45am   CORONARY PRESSURE/FFR STUDY N/A 07/21/2019   Procedure: INTRAVASCULAR PRESSURE WIRE/FFR STUDY;  Surgeon: Lyn Records, MD;  Location: [facility];  Service:  Cardiovascular;  Laterality: N/A;   CYSTOSCOPY WITH RETROGRADE PYELOGRAM, URETEROSCOPY AND STENT PLACEMENT Right 08/18/2020   Procedure: CYSTOSCOPY WITH INCISION OF RIGHT URETEROCELE, URETEROSCOPY WITH HOLMIUM LASER, CYSTOLITHOLAPAXY OF 10 MM BLADDER STONE;  Surgeon: Bjorn Pippin, MD;  Location: [facility];  Service: Urology;  Laterality: Right;   ESOPHAGOGASTRODUODENOSCOPY  09/15/2010   Dr. Jena Gauss- Salmon-colored epithelium coming up to 37 cm from the    ESOPHAGOGASTRODUODENOSCOPY N/A 07/02/2013   Dr. Jena Gauss- Barretts on bx. hiatal hernia   ESOPHAGOGASTRODUODENOSCOPY N/A 09/13/2016   RMR: Barrett's esophagus without dysplasia. one last EGD in 3 years.    ESOPHAGOGASTRODUODENOSCOPY (EGD) WITH PROPOFOL N/A 01/28/2020   Procedure: ESOPHAGOGASTRODUODENOSCOPY (EGD) WITH PROPOFOL;  Surgeon: Corbin Ade, MD;  [facility];  Service: Endoscopy;  Laterality: N/A;   LEFT HEART CATH AND CORONARY ANGIOGRAPHY N/A 07/21/2019   Procedure: LEFT HEART CATH AND CORONARY ANGIOGRAPHY;  Surgeon: Lyn Records, MD;  [facility]: [facility];  Service: Cardiovascular;  Laterality: N/A;   POLYPECTOMY  01/28/2020   Procedure: POLYPECTOMY;  Surgeon: Corbin Ade, MD;  Location: [facility];  Service: Endoscopy;;  colon   ROTATOR CUFF REPAIR     YAG LASER APPLICATION Right 12/20/2014   Procedure: YAG LASER APPLICATION;  Surgeon: Susa Simmonds, MD;  Location: [facility];  Service: Ophthalmology;  Laterality: Right;    Social History   Socioeconomic History   Marital status: Married    Spouse name: Not on file   Number of children: Not on file   Years of education: Not on file   Highest education level: Not on file  Occupational History   Not on file  Tobacco Use   Smoking status: Former    Current packs/day: 0.00    Average packs/day: 1.5 packs/day for 30.0 years (45.0 ttl pk-yrs)    Types: Cigarettes    Start date: 11/19/1963    Quit date: 11/18/1993    Years since quitting: 29.9   Smokeless  tobacco: Never   Tobacco comments:    quit in the 90's  Vaping Use   Vaping status: Never Used  Substance and Sexual Activity   Alcohol use: No   Drug use: No   Sexual activity: Not on file  Other Topics Concern   Not on file  Social History Narrative   Not on file   Social Determinants of Health   Financial Resource Strain: Low Risk  (08/01/2023)   Overall Financial Resource Strain (CARDIA)    Difficulty of Paying Living Expenses: Not very hard  Food Insecurity: No Food Insecurity (07/12/2023)   Hunger Vital Sign    Worried About Running Out of Food in the Last Year: Never true    Ran Out of Food in the Last Year: Never true  Transportation Needs: No Transportation Needs (09/03/2023)   PRAPARE - Administrator, Civil Service (Medical): No    Lack of Transportation (Non-Medical): No  Physical Activity: Inactive (07/19/2023)   Exercise Vital Sign    Days of Exercise per Week: 0 days    Minutes of Exercise per Session: 0 min  Stress: Not on file  Social Connections: Not on file    Family History  Problem Relation Age of Onset   Prostate cancer Father        deceased   Stroke Mother        deceased   Colon cancer Neg Hx     Outpatient Encounter Medications as of 10/14/2023  Medication Sig   allopurinol (ZYLOPRIM) 100 MG tablet Take 100 mg by mouth daily.    ALPRAZolam (XANAX) 1 MG tablet Take 0.5 mg by mouth at bedtime as needed for anxiety or sleep.   Cholecalciferol (VITAMIN D3) 50 MCG (2000 UT) TABS Take 2,000 Units by mouth daily.   Continuous Glucose Sensor (DEXCOM G7 SENSOR) MISC by Does not apply route.   DULoxetine (CYMBALTA) 60 MG capsule Take 60 mg by mouth daily.   fexofenadine (ALLEGRA) 180 MG tablet Take 180 mg by mouth daily.   finasteride (PROSCAR) 5 MG tablet TAKE ONE TABLET (5MG  TOTAL) BY MOUTH DAILY (Patient taking differently: Take 5 mg by mouth daily.)   gabapentin (NEURONTIN) 300 MG capsule Take 300 mg by mouth 2 (two) times daily.     methocarbamol (ROBAXIN) 500 MG tablet Take 1 tablet (500 mg total) by mouth 3 (three) times daily.   nystatin (MYCOSTATIN) 100000 UNIT/ML suspension Take 5 mLs by mouth 4 (four) times daily.   pantoprazole (PROTONIX) 40 MG tablet TAKE ONE TABLET (40MG  TOTAL) BY MOUTH TWO TIMES DAILY BEFORE A MEAL (Patient taking  differently: Take 40 mg by mouth 2 (two) times daily.)   rosuvastatin (CRESTOR) 10 MG tablet TAKE ONE TABLET (10MG  TOTAL) BY MOUTH DAILY   tamsulosin (FLOMAX) 0.4 MG CAPS capsule TAKE ONE CAPSULE (0.4MG  TOTAL) BY MOUTH TWO TIMES DAILY (Patient taking differently: Take 0.4 mg by mouth daily.)   traMADol (ULTRAM) 50 MG tablet Take 1 tablet (50 mg total) by mouth every 6 (six) hours as needed for moderate pain (pain.).   warfarin (COUMADIN) 5 MG tablet TAKE ONE TABLET BY MOUTH DAILY EXCEPT TAKE ONE AND ONE-HALF TABLETS ON TUESDAYS AND FRIDAYS OR AS DIRECTED (Patient taking differently: Take 5 mg by mouth See admin instructions. TAKE ONE TABLET BY MOUTH DAILY EXCEPT TAKE ONE AND ONE-HALF TABLETS ON TUESDAYS AND FRIDAYS OR AS DIRECTED)   [DISCONTINUED] glipiZIDE (GLUCOTROL XL) 5 MG 24 hr tablet Take 1 tablet (5 mg total) by mouth daily with breakfast.   [DISCONTINUED] metFORMIN (GLUCOPHAGE-XR) 500 MG 24 hr tablet Take 2 tablets (1,000 mg total) by mouth daily with breakfast.   [DISCONTINUED] pioglitazone (ACTOS) 30 MG tablet Take 30 mg by mouth daily.   glipiZIDE (GLUCOTROL XL) 5 MG 24 hr tablet Take 1 tablet (5 mg total) by mouth daily with breakfast.   metFORMIN (GLUCOPHAGE-XR) 500 MG 24 hr tablet Take 2 tablets (1,000 mg total) by mouth daily with breakfast.   pioglitazone (ACTOS) 30 MG tablet Take 1 tablet (30 mg total) by mouth daily.   No facility-administered encounter medications on file as of 10/14/2023.    ALLERGIES: Allergies  Allergen Reactions   Prednisone     Patient state that this medication made his blood sugar go real high    VACCINATION STATUS: Immunization History   Administered Date(s) Administered   Influenza-Unspecified 09/19/2013, 08/19/2022    Diabetes He presents for his follow-up diabetic visit. He has type 2 diabetes mellitus. Onset time: diagnosed at approximate age of 60. His disease course has been stable. There are no hypoglycemic associated symptoms. Pertinent negatives for hypoglycemia include no confusion, headaches, seizures or tremors. Associated symptoms include fatigue and foot paresthesias. Pertinent negatives for diabetes include no chest pain, no foot ulcerations, no polydipsia, no polyuria, no visual change and no weight loss. There are no hypoglycemic complications. Symptoms are stable. Diabetic complications include heart disease, impotence and peripheral neuropathy. Risk factors for coronary artery disease include diabetes mellitus, dyslipidemia, hypertension, male sex, obesity and sedentary lifestyle. Current diabetic treatment includes oral agent (triple therapy). He is compliant with treatment most of the time. His weight is fluctuating minimally. He is following a generally unhealthy diet. When asked about meal planning, he reported none. He has not had a previous visit with a dietitian. He rarely participates in exercise. His home blood glucose trend is fluctuating minimally. His overall blood glucose range is 140-180 mg/dl. (He presents today with his CGM showing mostly at goal glycemic profile.  His POCT A1c today is 7%, essentially unchanged from previous visit.  Analysis of his CGM shows GMI of 7.1%.  He did come by with concerns of low glucose last Friday but it was because he was working out in the yard and did not eat much prior to that.) An ACE inhibitor/angiotensin II receptor blocker is not being taken. He does not see a podiatrist.Eye exam is current.    Review of systems  Constitutional: + Minimally fluctuating body weight,  current Body mass index is 30.35 kg/m. , + fatigue, no subjective hyperthermia, no subjective  hypothermia Eyes: no blurry vision, no  xerophthalmia ENT: no sore throat, no nodules palpated in throat, no dysphagia/odynophagia, no hoarseness Cardiovascular: no chest pain, no shortness of breath, no palpitations, no leg swelling Respiratory: no cough, no shortness of breath Gastrointestinal: no nausea/vomiting/diarrhea Musculoskeletal: + chronic intermittent back pain (used to get injections every so often) Skin: no rashes, no hyperemia Neurological: no tremors, + numbness/tingling to BLE and hands- cannot feel feet most days, no dizziness Psychiatric: no depression, no anxiety   Objective:    BP 104/66 (BP Location: Right Arm, Patient Position: Sitting, Cuff Size: Large)   Pulse 90   Ht 6' (1.829 m)   Wt 223 lb 12.8 oz (101.5 kg)   BMI 30.35 kg/m   Wt Readings from Last 3 Encounters:  10/14/23 223 lb 12.8 oz (101.5 kg)  07/17/23 217 lb 8 oz (98.7 kg)  06/11/23 215 lb (97.5 kg)    BP Readings from Last 3 Encounters:  10/14/23 104/66  09/09/23 (!) 128/57  07/17/23 (!) 107/54     Physical Exam- Limited  Constitutional:  Body mass index is 30.35 kg/m. , not in acute distress, normal state of mind Eyes:  EOMI, no exophthalmos Musculoskeletal: no gross deformities, strength intact in all four extremities, no gross restriction of joint movements Skin:  no rashes, no hyperemia Neurological: no tremor with outstretched hands   Diabetic Foot Exam - Simple   Simple Foot Form Diabetic Foot exam was performed with the following findings: Yes 10/14/2023  2:06 PM  Visual Inspection No deformities, no ulcerations, no other skin breakdown bilaterally: Yes Sensation Testing Intact to touch and monofilament testing bilaterally: Yes Pulse Check Posterior Tibialis and Dorsalis pulse intact bilaterally: Yes Comments     CMP ( most recent) CMP     Component Value Date/Time   NA 138 05/06/2023 0430   NA 141 11/30/2022 1504   K 4.0 05/06/2023 0430   CL 106 05/06/2023 0430    CO2 24 05/06/2023 0430   GLUCOSE 119 (H) 05/06/2023 0430   BUN 14 05/06/2023 0430   BUN 14 11/30/2022 1504   CREATININE 1.13 05/06/2023 0430   CREATININE 1.16 07/03/2019 1343   CALCIUM 8.5 (L) 05/06/2023 0430   PROT 6.1 (L) 05/03/2023 0700   PROT 6.2 11/30/2022 1504   ALBUMIN 3.1 (L) 05/06/2023 0430   ALBUMIN 4.1 11/30/2022 1504   AST 23 05/03/2023 0700   ALT 21 05/03/2023 0700   ALKPHOS 53 05/03/2023 0700   BILITOT 1.2 05/03/2023 0700   BILITOT 0.4 11/30/2022 1504   GFRNONAA >60 05/06/2023 0430   GFRAA >60 08/12/2020 1336     Diabetic Labs (most recent): Lab Results  Component Value Date   HGBA1C 7.0 (A) 10/14/2023   HGBA1C 6.9 (A) 06/10/2023   HGBA1C 7.9 (A) 03/05/2023   MICROALBUR 10 mg/L 06/10/2023   MICROALBUR 10 02/21/2022   MICROALBUR 30 02/23/2021     Lipid Panel ( most recent) Lipid Panel  No results found for: "CHOL", "TRIG", "HDL", "CHOLHDL", "VLDL", "LDLCALC", "LDLDIRECT", "LABVLDL"    Lab Results  Component Value Date   TSH 2.420 05/25/2022   TSH 3.235 05/02/2018   FREET4 1.10 05/25/2022         Assessment & Plan:   1) Type 2 diabetes mellitus with hyperglycemia, without long-term current use of insulin (HCC)  He presents today with his CGM showing mostly at goal glycemic profile.  His POCT A1c today is 7%, essentially unchanged from previous visit.  Analysis of his CGM shows GMI of 7.1%.  He did come  by with concerns of low glucose last Friday but it was because he was working out in the yard and did not eat much prior to that.  - BRODIE CHRISTIN has currently uncontrolled symptomatic type 2 DM since 82 years of age.   Recent labs reviewed.  - I had a long discussion with him about the progressive nature of diabetes and the pathology behind its complications.  -his diabetes is complicated by CAD, peripheral neuropathy and he remains at a high risk for more acute and chronic complications which include CAD, CVA, CKD, retinopathy, and neuropathy.  These are all discussed in detail with him.  - Nutritional counseling repeated at each appointment due to patients tendency to fall back in to old habits.  - The patient admits there is a room for improvement in their diet and drink choices. -  Suggestion is made for the patient to avoid simple carbohydrates from their diet including Cakes, Sweet Desserts / Pastries, Ice Cream, Soda (diet and regular), Sweet Tea, Candies, Chips, Cookies, Sweet Pastries, Store Bought Juices, Alcohol in Excess of 1-2 drinks a day, Artificial Sweeteners, Coffee Creamer, and "Sugar-free" Products. This will help patient to have stable blood glucose profile and potentially avoid unintended weight gain.   - I encouraged the patient to switch to unprocessed or minimally processed complex starch and increased protein intake (animal or plant source), fruits, and vegetables.   - Patient is advised to stick to a routine mealtimes to eat 3 meals a day and avoid unnecessary snacks (to snack only to correct hypoglycemia).  - I have approached him with the following individualized plan to manage  his diabetes and patient agrees:   -Given his improved glycemic profile overall, no changes will be made to his medications today.  He is advised to continue his Metformin 1000 mg ER daily with breakfast, Glipizide 5 mg po daily with breakfast, and Actos 30 mg po daily.  He is aware to reach out if he experiences low reading more than 1-2 in a week and we may stop his Glipizide.  -He did qualify for PAP for Rybelsus, unfortunately he did not tolerate that medication due to vertigo.  -He is encouraged to monitor blood glucose at least twice daily (using his CGM), before breakfast and before bed, and to notify the clinic if his glucose readings are less than 70 or above 300 for 3 tests in a row.  He has benefited from CGM device, is advised to continue wearing it.  - Specific targets for  A1c;  LDL, HDL,  and Triglycerides were discussed  with the patient.  2) Blood Pressure /Hypertension:  His blood pressure is controlled to target without the use of antihypertensive medications.  3) Lipids/Hyperlipidemia:  There are no recent lipid panel results to review, nor does he take any medication for high cholesterol.  He reports he has had his cholesterol checked at his PCP, although no records have been sent to [place].   4)  Weight/Diet:  His Body mass index is 30.35 kg/m.  - complicating his diabetes care.   he is a candidate for some weight loss. I discussed with him the fact that loss of 5 - 10% of his  current body weight will have the most impact on his diabetes management.  Exercise, and detailed carbohydrates information provided  -  detailed on discharge instructions.  5) Chronic Care/Health Maintenance: -he is encouraged to initiate and continue to follow up with Ophthalmology, Dentist,  Podiatrist at least  yearly or according to recommendations, and advised to stay away from smoking and/or secondhand smoke exposure. I have recommended yearly flu vaccine and pneumonia vaccine at least every 5 years; moderate intensity exercise for up to 150 minutes weekly; and  sleep for at least 7 hours a day.    - he is advised to maintain close follow up with Elfredia Nevins, MD for primary care needs, as well as his other providers for optimal and coordinated care.  I did encourage him to restart his Allegra daily for allergies as it may be a potential trigger for his recent bronchitis.  He is advised to follow up with his pulmonologist as well.  -I encouraged him to lower his Gabapentin to 1 tablet in the morning and 2 tabs at night (has been taking 2 tablets twice daily), which may increase his risk of falls and be contributing to his daytime somnolence.      I spent  36  minutes in the care of the patient today including review of labs from CMP, Lipids, Thyroid Function, Hematology (current and previous including abstractions from other  facilities); face-to-face time discussing  his blood glucose readings/logs, discussing hypoglycemia and hyperglycemia episodes and symptoms, medications doses, his options of short and long term treatment based on the latest standards of care / guidelines;  discussion about incorporating lifestyle medicine;  and documenting the encounter. Risk reduction counseling performed per USPSTF guidelines to reduce obesity and cardiovascular risk factors.     Please refer to Patient Instructions for Blood Glucose Monitoring and Insulin/Medications Dosing Guide"  in media tab for additional information. Please  also refer to " Patient Self Inventory" in the Media  tab for reviewed elements of pertinent patient history.  [name] participated in the discussions, expressed understanding, and voiced agreement with the above plans.  All questions were answered to his satisfaction. he is encouraged to contact clinic should he have any questions or concerns prior to his return visit.    Follow up plan: - Return in about 4 months (around 02/11/2024) for Diabetes F/U with A1c in office, No previsit labs, Bring meter and logs.  [name], [facility] [address] [place], [place] [address] Phone: [phone] Fax: [phone]  10/14/2023, 2:19 PM

## 2023-10-15 ENCOUNTER — Ambulatory Visit: Payer: Medicare Other | Attending: Cardiology | Admitting: *Deleted

## 2023-10-15 DIAGNOSIS — I4891 Unspecified atrial fibrillation: Secondary | ICD-10-CM | POA: Diagnosis not present

## 2023-10-15 DIAGNOSIS — Z5181 Encounter for therapeutic drug level monitoring: Secondary | ICD-10-CM

## 2023-10-15 LAB — POCT INR: INR: 2.2 (ref 2.0–3.0)

## 2023-10-15 NOTE — Patient Instructions (Signed)
Continue warfarin 1 tablet daily except 1 1/2 tablets on Tuesdays and Friday Recheck in 5 wks

## 2023-10-22 ENCOUNTER — Other Ambulatory Visit: Payer: Self-pay | Admitting: Cardiology

## 2023-10-22 NOTE — Telephone Encounter (Signed)
Refill request for warfarin:  Last INR was 2.2 on 10/15/23 Next INR due 11/21/22 LOV was 04/23/53  Refill approved.

## 2023-11-22 ENCOUNTER — Ambulatory Visit: Payer: Medicare Other | Attending: Cardiology | Admitting: *Deleted

## 2023-11-22 DIAGNOSIS — I4891 Unspecified atrial fibrillation: Secondary | ICD-10-CM | POA: Insufficient documentation

## 2023-11-22 DIAGNOSIS — Z5181 Encounter for therapeutic drug level monitoring: Secondary | ICD-10-CM | POA: Diagnosis not present

## 2023-11-22 LAB — POCT INR: INR: 2.3 (ref 2.0–3.0)

## 2023-11-22 NOTE — Patient Instructions (Signed)
 Continue warfarin 1 tablet daily except 1 1/2 tablets on Tuesdays and Friday Recheck in 6 wks

## 2023-11-26 DIAGNOSIS — I700 Atherosclerosis of aorta: Secondary | ICD-10-CM | POA: Diagnosis not present

## 2023-11-26 DIAGNOSIS — I779 Disorder of arteries and arterioles, unspecified: Secondary | ICD-10-CM | POA: Diagnosis not present

## 2023-11-26 DIAGNOSIS — Z6830 Body mass index (BMI) 30.0-30.9, adult: Secondary | ICD-10-CM | POA: Diagnosis not present

## 2023-11-26 DIAGNOSIS — E1140 Type 2 diabetes mellitus with diabetic neuropathy, unspecified: Secondary | ICD-10-CM | POA: Diagnosis not present

## 2023-11-26 DIAGNOSIS — J449 Chronic obstructive pulmonary disease, unspecified: Secondary | ICD-10-CM | POA: Diagnosis not present

## 2023-11-26 DIAGNOSIS — M5416 Radiculopathy, lumbar region: Secondary | ICD-10-CM | POA: Diagnosis not present

## 2023-11-26 DIAGNOSIS — J849 Interstitial pulmonary disease, unspecified: Secondary | ICD-10-CM | POA: Diagnosis not present

## 2023-11-26 DIAGNOSIS — I480 Paroxysmal atrial fibrillation: Secondary | ICD-10-CM | POA: Diagnosis not present

## 2023-11-26 DIAGNOSIS — M47816 Spondylosis without myelopathy or radiculopathy, lumbar region: Secondary | ICD-10-CM | POA: Diagnosis not present

## 2023-11-26 DIAGNOSIS — B349 Viral infection, unspecified: Secondary | ICD-10-CM | POA: Diagnosis not present

## 2023-11-26 DIAGNOSIS — E6609 Other obesity due to excess calories: Secondary | ICD-10-CM | POA: Diagnosis not present

## 2023-11-26 DIAGNOSIS — N400 Enlarged prostate without lower urinary tract symptoms: Secondary | ICD-10-CM | POA: Diagnosis not present

## 2023-12-18 DIAGNOSIS — Z20828 Contact with and (suspected) exposure to other viral communicable diseases: Secondary | ICD-10-CM | POA: Diagnosis not present

## 2023-12-18 DIAGNOSIS — R6889 Other general symptoms and signs: Secondary | ICD-10-CM | POA: Diagnosis not present

## 2023-12-18 DIAGNOSIS — Z6830 Body mass index (BMI) 30.0-30.9, adult: Secondary | ICD-10-CM | POA: Diagnosis not present

## 2023-12-18 DIAGNOSIS — E6609 Other obesity due to excess calories: Secondary | ICD-10-CM | POA: Diagnosis not present

## 2023-12-18 DIAGNOSIS — J069 Acute upper respiratory infection, unspecified: Secondary | ICD-10-CM | POA: Diagnosis not present

## 2024-01-02 ENCOUNTER — Ambulatory Visit: Payer: Medicare Other | Attending: Cardiology | Admitting: *Deleted

## 2024-01-02 DIAGNOSIS — I4891 Unspecified atrial fibrillation: Secondary | ICD-10-CM | POA: Diagnosis not present

## 2024-01-02 DIAGNOSIS — Z5181 Encounter for therapeutic drug level monitoring: Secondary | ICD-10-CM | POA: Diagnosis not present

## 2024-01-02 LAB — POCT INR: INR: 4.4 — AB (ref 2.0–3.0)

## 2024-01-02 NOTE — Patient Instructions (Signed)
Hold warfarin tonight, take 1/2 tablet tomorrow night then resume 1 tablet daily except 1 1/2 tablets on Tuesdays and Friday Recheck in 2 wks

## 2024-01-16 ENCOUNTER — Ambulatory Visit: Payer: Medicare Other | Attending: Cardiology | Admitting: *Deleted

## 2024-01-16 DIAGNOSIS — I4891 Unspecified atrial fibrillation: Secondary | ICD-10-CM | POA: Diagnosis not present

## 2024-01-16 DIAGNOSIS — Z5181 Encounter for therapeutic drug level monitoring: Secondary | ICD-10-CM | POA: Insufficient documentation

## 2024-01-16 LAB — POCT INR: INR: 4 — AB (ref 2.0–3.0)

## 2024-01-16 NOTE — Patient Instructions (Signed)
Hold warfarin tonight then decrease dose to 1 tablet daily  ?Recheck in 2 wks ?

## 2024-02-03 ENCOUNTER — Ambulatory Visit: Payer: Medicare Other

## 2024-02-05 ENCOUNTER — Ambulatory Visit: Attending: Cardiology | Admitting: *Deleted

## 2024-02-05 DIAGNOSIS — Z5181 Encounter for therapeutic drug level monitoring: Secondary | ICD-10-CM | POA: Insufficient documentation

## 2024-02-05 DIAGNOSIS — I4891 Unspecified atrial fibrillation: Secondary | ICD-10-CM | POA: Insufficient documentation

## 2024-02-05 LAB — POCT INR: INR: 3 (ref 2.0–3.0)

## 2024-02-05 NOTE — Patient Instructions (Signed)
Continue warfarin 1 tablet daily Recheck in 4 wks 

## 2024-02-07 DIAGNOSIS — J019 Acute sinusitis, unspecified: Secondary | ICD-10-CM | POA: Diagnosis not present

## 2024-02-07 DIAGNOSIS — J069 Acute upper respiratory infection, unspecified: Secondary | ICD-10-CM | POA: Diagnosis not present

## 2024-02-07 DIAGNOSIS — J029 Acute pharyngitis, unspecified: Secondary | ICD-10-CM | POA: Diagnosis not present

## 2024-02-07 DIAGNOSIS — J209 Acute bronchitis, unspecified: Secondary | ICD-10-CM | POA: Diagnosis not present

## 2024-02-12 ENCOUNTER — Encounter: Payer: Self-pay | Admitting: Nurse Practitioner

## 2024-02-12 ENCOUNTER — Ambulatory Visit (INDEPENDENT_AMBULATORY_CARE_PROVIDER_SITE_OTHER): Payer: Medicare Other | Admitting: Nurse Practitioner

## 2024-02-12 VITALS — BP 116/80 | HR 78 | Ht 72.0 in | Wt 226.8 lb

## 2024-02-12 DIAGNOSIS — Z7984 Long term (current) use of oral hypoglycemic drugs: Secondary | ICD-10-CM | POA: Diagnosis not present

## 2024-02-12 DIAGNOSIS — G6289 Other specified polyneuropathies: Secondary | ICD-10-CM

## 2024-02-12 DIAGNOSIS — E1165 Type 2 diabetes mellitus with hyperglycemia: Secondary | ICD-10-CM | POA: Diagnosis not present

## 2024-02-12 DIAGNOSIS — E559 Vitamin D deficiency, unspecified: Secondary | ICD-10-CM | POA: Diagnosis not present

## 2024-02-12 LAB — POCT GLYCOSYLATED HEMOGLOBIN (HGB A1C): Hemoglobin A1C: 6.9 % — AB (ref 4.0–5.6)

## 2024-02-12 NOTE — Progress Notes (Signed)
 Endocrinology Follow Up Visit      02/12/2024, 3:40 PM      Subjective:    Patient ID: [name], male    DOB: May 06, 1941.  [name] is being seen in follow up after being seen in consultation for management of currently uncontrolled symptomatic diabetes requested by  Elfredia Nevins, MD.  He is a retired Emergency planning/management officer for [employer].   Past Medical History:  Diagnosis Date   Aortic atherosclerosis (HCC)    Barrett's esophagus    Short-segment, diagnosed in 2007 by Dr. Jena Gauss   BPH (benign prostatic hyperplasia)    CAD (coronary artery disease)    a. cath on 07/21/2019 showing patent LM and LCx with 40-60% Proximal LAD and 30-40% Pro-RCA stenosis with normal LV function.   Essential hypertension    GERD (gastroesophageal reflux disease)    Hiatal hernia    History of kidney stones    PAF (paroxysmal atrial fibrillation) (HCC)    S/P colonoscopy 2007   S/P endoscopy 08/2010    Salmon-colored epithelium coming up to 37 cm from the    Type 2 diabetes mellitus (HCC)    Vertigo     Past Surgical History:  Procedure Laterality Date   APPENDECTOMY     BIOPSY  01/28/2020   Procedure: BIOPSY;  Surgeon: Corbin Ade, MD;  Location: [facility];  Service: Endoscopy;;  esophagus   CHOLECYSTECTOMY     COLONOSCOPY  02/06/2006   Dr. Jena Gauss- single anal papilla, o/w normal rectum, normal colon   COLONOSCOPY N/A 09/13/2016   RMR: three 5-6 mm for adenomas removed.  Recommend one last surveillance colonoscopy in October 2020.   COLONOSCOPY WITH PROPOFOL N/A 01/28/2020   Procedure: COLONOSCOPY WITH PROPOFOL;  Surgeon: Corbin Ade, MD;  Location: [facility];  Service: Endoscopy;  Laterality: N/A;  10:45am   CORONARY PRESSURE/FFR STUDY N/A 07/21/2019   Procedure: INTRAVASCULAR PRESSURE WIRE/FFR STUDY;  Surgeon: Lyn Records, MD;  Location: [facility];  Service:  Cardiovascular;  Laterality: N/A;   CYSTOSCOPY WITH RETROGRADE PYELOGRAM, URETEROSCOPY AND STENT PLACEMENT Right 08/18/2020   Procedure: CYSTOSCOPY WITH INCISION OF RIGHT URETEROCELE, URETEROSCOPY WITH HOLMIUM LASER, CYSTOLITHOLAPAXY OF 10 MM BLADDER STONE;  Surgeon: Bjorn Pippin, MD;  Location: [facility];  Service: Urology;  Laterality: Right;   ESOPHAGOGASTRODUODENOSCOPY  09/15/2010   Dr. Jena Gauss- Salmon-colored epithelium coming up to 37 cm from the    ESOPHAGOGASTRODUODENOSCOPY N/A 07/02/2013   Dr. Jena Gauss- Barretts on bx. hiatal hernia   ESOPHAGOGASTRODUODENOSCOPY N/A 09/13/2016   RMR: Barrett's esophagus without dysplasia. one last EGD in 3 years.    ESOPHAGOGASTRODUODENOSCOPY (EGD) WITH PROPOFOL N/A 01/28/2020   Procedure: ESOPHAGOGASTRODUODENOSCOPY (EGD) WITH PROPOFOL;  Surgeon: Corbin Ade, MD;  [facility]: [facility];  Service: Endoscopy;  Laterality: N/A;   LEFT HEART CATH AND CORONARY ANGIOGRAPHY N/A 07/21/2019   Procedure: LEFT HEART CATH AND CORONARY ANGIOGRAPHY;  Surgeon: Lyn Records, MD;  [facility]: [facility];  Service: Cardiovascular;  Laterality: N/A;   POLYPECTOMY  01/28/2020   Procedure: POLYPECTOMY;  Surgeon: Corbin Ade, MD;  Location: [facility];  Service: Endoscopy;;  colon   ROTATOR CUFF REPAIR     YAG LASER APPLICATION Right 12/20/2014   Procedure: YAG LASER APPLICATION;  Surgeon: Susa Simmonds, MD;  Location: [facility];  Service: Ophthalmology;  Laterality: Right;    Social History   Socioeconomic History   Marital status: Married    Spouse name: Not on file   Number of children: Not on file   Years of education: Not on file   Highest education level: Not on file  Occupational History   Not on file  Tobacco Use   Smoking status: Former    Current packs/day: 0.00    Average packs/day: 1.5 packs/day for 30.0 years (45.0 ttl pk-yrs)    Types: Cigarettes    Start date: 11/19/1963    Quit date: 11/18/1993    Years since quitting: 30.2   Smokeless  tobacco: Never   Tobacco comments:    quit in the 90's  Vaping Use   Vaping status: Never Used  Substance and Sexual Activity   Alcohol use: No   Drug use: No   Sexual activity: Not on file  Other Topics Concern   Not on file  Social History Narrative   Not on file   Social Drivers of Health   Financial Resource Strain: Low Risk  (08/01/2023)   Overall Financial Resource Strain (CARDIA)    Difficulty of Paying Living Expenses: Not very hard  Food Insecurity: No Food Insecurity (07/12/2023)   Hunger Vital Sign    Worried About Running Out of Food in the Last Year: Never true    Ran Out of Food in the Last Year: Never true  Transportation Needs: No Transportation Needs (09/03/2023)   PRAPARE - Administrator, Civil Service (Medical): No    Lack of Transportation (Non-Medical): No  Physical Activity: Inactive (07/19/2023)   Exercise Vital Sign    Days of Exercise per Week: 0 days    Minutes of Exercise per Session: 0 min  Stress: Not on file  Social Connections: Not on file    Family History  Problem Relation Age of Onset   Prostate cancer Father        deceased   Stroke Mother        deceased   Colon cancer Neg Hx     Outpatient Encounter Medications as of 02/12/2024  Medication Sig   allopurinol (ZYLOPRIM) 100 MG tablet Take 100 mg by mouth daily.    ALPRAZolam (XANAX) 1 MG tablet Take 0.5 mg by mouth at bedtime as needed for anxiety or sleep.   cefdinir (OMNICEF) 300 MG capsule Take 300 mg by mouth 2 (two) times daily.   Cholecalciferol (VITAMIN D3) 50 MCG (2000 UT) TABS Take 2,000 Units by mouth daily.   Continuous Glucose Sensor (DEXCOM G7 SENSOR) MISC by Does not apply route.   DULoxetine (CYMBALTA) 60 MG capsule Take 60 mg by mouth daily.   fexofenadine (ALLEGRA) 180 MG tablet Take 180 mg by mouth daily.   finasteride (PROSCAR) 5 MG tablet TAKE ONE TABLET (5MG  TOTAL) BY MOUTH DAILY (Patient taking differently: Take 5 mg by mouth daily.)   gabapentin  (NEURONTIN) 300 MG capsule Take 300 mg by mouth 2 (two) times daily.    glipiZIDE (GLUCOTROL XL) 5 MG 24 hr tablet Take 1 tablet (5 mg total) by mouth daily with breakfast.   metFORMIN (GLUCOPHAGE-XR) 500 MG 24 hr tablet Take 2 tablets (1,000 mg total) by mouth daily with breakfast.  methocarbamol (ROBAXIN) 500 MG tablet Take 1 tablet (500 mg total) by mouth 3 (three) times daily.   nystatin (MYCOSTATIN) 100000 UNIT/ML suspension Take 5 mLs by mouth 4 (four) times daily.   pantoprazole (PROTONIX) 40 MG tablet TAKE ONE TABLET (40MG  TOTAL) BY MOUTH TWO TIMES DAILY BEFORE A MEAL (Patient taking differently: Take 40 mg by mouth 2 (two) times daily.)   pioglitazone (ACTOS) 30 MG tablet Take 1 tablet (30 mg total) by mouth daily.   rosuvastatin (CRESTOR) 10 MG tablet TAKE ONE TABLET (10MG  TOTAL) BY MOUTH DAILY   tamsulosin (FLOMAX) 0.4 MG CAPS capsule TAKE ONE CAPSULE (0.4MG  TOTAL) BY MOUTH TWO TIMES DAILY (Patient taking differently: Take 0.4 mg by mouth daily.)   traMADol (ULTRAM) 50 MG tablet Take 1 tablet (50 mg total) by mouth every 6 (six) hours as needed for moderate pain (pain.).   warfarin (COUMADIN) 5 MG tablet TAKE ONE TABLET BY MOUTH DAILY EXCEPT TAKE ONE AND ONE-HALF TABLETS ON TUESDAYS AND FRIDAYS OR AS DIRECTED   No facility-administered encounter medications on file as of 02/12/2024.    ALLERGIES: Allergies  Allergen Reactions   Prednisone     Patient state that this medication made his blood sugar go real high    VACCINATION STATUS: Immunization History  Administered Date(s) Administered   Influenza-Unspecified 09/19/2013, 08/19/2022    Diabetes He presents for his follow-up diabetic visit. He has type 2 diabetes mellitus. Onset time: diagnosed at approximate age of 9. His disease course has been improving. There are no hypoglycemic associated symptoms. Pertinent negatives for hypoglycemia include no confusion, headaches, seizures or tremors. Associated symptoms include  fatigue and foot paresthesias. Pertinent negatives for diabetes include no chest pain, no foot ulcerations, no polydipsia, no polyuria, no visual change and no weight loss. There are no hypoglycemic complications. Symptoms are stable. Diabetic complications include heart disease, impotence and peripheral neuropathy. Risk factors for coronary artery disease include diabetes mellitus, dyslipidemia, hypertension, male sex, obesity and sedentary lifestyle. Current diabetic treatment includes oral agent (triple therapy). He is compliant with treatment most of the time. His weight is fluctuating minimally. He is following a generally unhealthy diet. When asked about meal planning, he reported none. He has not had a previous visit with a dietitian. He rarely participates in exercise. His home blood glucose trend is decreasing steadily. His overall blood glucose range is 140-180 mg/dl. (He presents today with his CGM showing improving glycemic profile.  His POCT A1c today is 6.9%, improving from last visit of 7%.  He notes he is currently on antibiotic for bronchitis again.  He also has concerns with daytime sleepiness and worsening neuropathy with ankle swelling.  Analysis of his CGM shows TIR 66%, TAR 34%, TBR 0% with a GMI of 7.4%.) An ACE inhibitor/angiotensin II receptor blocker is not being taken. He does not see a podiatrist.Eye exam is current.    Review of systems  Constitutional: + Minimally fluctuating body weight,  current Body mass index is 30.76 kg/m. , + fatigue, no subjective hyperthermia, no subjective hypothermia, + daytime sleepiness Eyes: no blurry vision, no xerophthalmia ENT: no sore throat, no nodules palpated in throat, no dysphagia/odynophagia, no hoarseness Cardiovascular: no chest pain, no shortness of breath, no palpitations, no leg swelling Respiratory: no cough, no shortness of breath Gastrointestinal: no nausea/vomiting/diarrhea Musculoskeletal: + chronic intermittent back pain  (used to get injections every so often), unsteady on feet (has fallen several times) Skin: no rashes, no hyperemia Neurological: no tremors, + numbness/tingling to BLE  and hands- cannot feel feet most days, no dizziness Psychiatric: no depression, no anxiety   Objective:    BP 116/80 (BP Location: Right Arm, Patient Position: Sitting, Cuff Size: Large)   Ht 6' (1.829 m)   Wt 226 lb 12.8 oz (102.9 kg)   BMI 30.76 kg/m   Wt Readings from Last 3 Encounters:  02/12/24 226 lb 12.8 oz (102.9 kg)  10/14/23 223 lb 12.8 oz (101.5 kg)  07/17/23 217 lb 8 oz (98.7 kg)    BP Readings from Last 3 Encounters:  02/12/24 116/80  10/14/23 104/66  09/09/23 (!) 128/57     Physical Exam- Limited  Constitutional:  Body mass index is 30.76 kg/m. , not in acute distress, normal state of mind Eyes:  EOMI, no exophthalmos Cardiovascular: nonpitting edema to BLE Musculoskeletal: no gross deformities, strength intact in all four extremities, no gross restriction of joint movements, did appear to lose his balance when standing after the visit  Skin:  no rashes, no hyperemia Neurological: no tremor with outstretched hands   Diabetic Foot Exam - Simple   No data filed     CMP ( most recent) CMP     Component Value Date/Time   NA 138 05/06/2023 0430   NA 141 11/30/2022 1504   K 4.0 05/06/2023 0430   CL 106 05/06/2023 0430   CO2 24 05/06/2023 0430   GLUCOSE 119 (H) 05/06/2023 0430   BUN 14 05/06/2023 0430   BUN 14 11/30/2022 1504   CREATININE 1.13 05/06/2023 0430   CREATININE 1.16 07/03/2019 1343   CALCIUM 8.5 (L) 05/06/2023 0430   PROT 6.1 (L) 05/03/2023 0700   PROT 6.2 11/30/2022 1504   ALBUMIN 3.1 (L) 05/06/2023 0430   ALBUMIN 4.1 11/30/2022 1504   AST 23 05/03/2023 0700   ALT 21 05/03/2023 0700   ALKPHOS 53 05/03/2023 0700   BILITOT 1.2 05/03/2023 0700   BILITOT 0.4 11/30/2022 1504   GFRNONAA >60 05/06/2023 0430   GFRAA >60 08/12/2020 1336     Diabetic Labs (most  recent): Lab Results  Component Value Date   HGBA1C 6.9 (A) 02/12/2024   HGBA1C 7.0 (A) 10/14/2023   HGBA1C 6.9 (A) 06/10/2023   MICROALBUR 10 mg/L 06/10/2023   MICROALBUR 10 02/21/2022   MICROALBUR 30 02/23/2021     Lipid Panel ( most recent) Lipid Panel  No results found for: "CHOL", "TRIG", "HDL", "CHOLHDL", "VLDL", "LDLCALC", "LDLDIRECT", "LABVLDL"    Lab Results  Component Value Date   TSH 2.420 05/25/2022   TSH 3.235 05/02/2018   FREET4 1.10 05/25/2022         Assessment & Plan:   1) Type 2 diabetes mellitus with hyperglycemia, without long-term current use of insulin (HCC)  He presents today with his CGM showing improving glycemic profile.  His POCT A1c today is 6.9%, improving from last visit of 7%.  He notes he is currently on antibiotic for bronchitis again.  He also has concerns with daytime sleepiness and worsening neuropathy with ankle swelling.  Analysis of his CGM shows TIR 66%, TAR 34%, TBR 0% with a GMI of 7.4%.  - KANISHK STROEBEL has currently uncontrolled symptomatic type 2 DM since 83 years of age.   Recent labs reviewed.  - I had a long discussion with him about the progressive nature of diabetes and the pathology behind its complications.  -his diabetes is complicated by CAD, peripheral neuropathy and he remains at a high risk for more acute and chronic complications which include CAD,  CVA, CKD, retinopathy, and neuropathy. These are all discussed in detail with him.  - Nutritional counseling repeated at each appointment due to patients tendency to fall back in to old habits.  - The patient admits there is a room for improvement in their diet and drink choices. -  Suggestion is made for the patient to avoid simple carbohydrates from their diet including Cakes, Sweet Desserts / Pastries, Ice Cream, Soda (diet and regular), Sweet Tea, Candies, Chips, Cookies, Sweet Pastries, Store Bought Juices, Alcohol in Excess of 1-2 drinks a day, Artificial Sweeteners,  Coffee Creamer, and "Sugar-free" Products. This will help patient to have stable blood glucose profile and potentially avoid unintended weight gain.   - I encouraged the patient to switch to unprocessed or minimally processed complex starch and increased protein intake (animal or plant source), fruits, and vegetables.   - Patient is advised to stick to a routine mealtimes to eat 3 meals a day and avoid unnecessary snacks (to snack only to correct hypoglycemia).  - I have approached him with the following individualized plan to manage  his diabetes and patient agrees:   -He is advised to continue Metformin 1000 mg ER daily, Glipizide 5 mg po daily, and lower his Actos to 15 mg po daily (to help with ankle swelling).    -He did qualify for PAP for Rybelsus, unfortunately he did not tolerate that medication due to vertigo.  He prefers to stay away from injectables at this time.  -He is encouraged to monitor blood glucose at least twice daily (using his CGM), before breakfast and before bed, and to notify the clinic if his glucose readings are less than 70 or above 300 for 3 tests in a row.  He has benefited from CGM device, is advised to continue wearing it.  - Specific targets for  A1c;  LDL, HDL,  and Triglycerides were discussed with the patient.  2) Blood Pressure /Hypertension:  His blood pressure is controlled to target without the use of antihypertensive medications.  3) Lipids/Hyperlipidemia:  There are no recent lipid panel results to review, nor does he take any medication for high cholesterol.  He reports he has had his cholesterol checked at his PCP, although no records have been sent to [place].   4)  Weight/Diet:  His Body mass index is 30.76 kg/m.  - complicating his diabetes care.   he is a candidate for some weight loss. I discussed with him the fact that loss of 5 - 10% of his  current body weight will have the most impact on his diabetes management.  Exercise, and detailed  carbohydrates information provided  -  detailed on discharge instructions.  5) Chronic Care/Health Maintenance: -he is encouraged to initiate and continue to follow up with Ophthalmology, Dentist,  Podiatrist at least yearly or according to recommendations, and advised to stay away from smoking and/or secondhand smoke exposure. I have recommended yearly flu vaccine and pneumonia vaccine at least every 5 years; moderate intensity exercise for up to 150 minutes weekly; and  sleep for at least 7 hours a day.    - he is advised to maintain close follow up with Elfredia Nevins, MD for primary care needs, as well as his other providers for optimal and coordinated care.  -I encouraged him to stop his morning Gabapentin and continue taking 2 tabs at night to see if it helps his daytime sleepiness and unsteadiness on his feet.      I spent  42  minutes in the care of the patient today including review of labs from CMP, Lipids, Thyroid Function, Hematology (current and previous including abstractions from other facilities); face-to-face time discussing  his blood glucose readings/logs, discussing hypoglycemia and hyperglycemia episodes and symptoms, medications doses, his options of short and long term treatment based on the latest standards of care / guidelines;  discussion about incorporating lifestyle medicine;  and documenting the encounter. Risk reduction counseling performed per USPSTF guidelines to reduce obesity and cardiovascular risk factors.     Please refer to Patient Instructions for Blood Glucose Monitoring and Insulin/Medications Dosing Guide"  in media tab for additional information. Please  also refer to " Patient Self Inventory" in the Media  tab for reviewed elements of pertinent patient history.  [name] participated in the discussions, expressed understanding, and voiced agreement with the above plans.  All questions were answered to his satisfaction. he is encouraged to contact  clinic should he have any questions or concerns prior to his return visit.    Follow up plan: - Return in about 4 months (around 06/13/2024) for Diabetes F/U with A1c in office, Previsit labs, Bring meter and logs.  [name], [facility] [address] [place], [place] [address] Phone: [phone] Fax: [phone]  02/12/2024, 3:40 PM

## 2024-02-21 ENCOUNTER — Other Ambulatory Visit: Payer: Self-pay | Admitting: Internal Medicine

## 2024-03-04 ENCOUNTER — Ambulatory Visit: Attending: Cardiology | Admitting: *Deleted

## 2024-03-04 DIAGNOSIS — I4891 Unspecified atrial fibrillation: Secondary | ICD-10-CM | POA: Diagnosis not present

## 2024-03-04 DIAGNOSIS — Z5181 Encounter for therapeutic drug level monitoring: Secondary | ICD-10-CM

## 2024-03-04 LAB — POCT INR: INR: 2.1 (ref 2.0–3.0)

## 2024-03-04 NOTE — Patient Instructions (Signed)
Continue warfarin 1 tablet daily Recheck in 4 wks 

## 2024-03-05 ENCOUNTER — Other Ambulatory Visit: Payer: Self-pay | Admitting: Urology

## 2024-03-05 DIAGNOSIS — N138 Other obstructive and reflux uropathy: Secondary | ICD-10-CM

## 2024-03-10 ENCOUNTER — Telehealth: Payer: Self-pay | Admitting: Nurse Practitioner

## 2024-03-10 NOTE — Telephone Encounter (Signed)
 I don't think the Actos  is making him sleepy, it may have contributed to ankle swelling, but it is likely his Gabapentin is making him sleepy.  I asked him to stop the day time dose of Gabapentin to see if it helped his daytime sleepiness.

## 2024-03-10 NOTE — Telephone Encounter (Signed)
 Pt stated that the 1/2 pill for Actos , he is still sleepy.

## 2024-03-11 NOTE — Telephone Encounter (Signed)
 Can you call him when you get a chance, it seems he may be confused about which medication he was supposed to cut down on.

## 2024-03-11 NOTE — Telephone Encounter (Signed)
 Patient was called and a message was left on his voicemail with Whitney's recommendation.

## 2024-03-17 DIAGNOSIS — E559 Vitamin D deficiency, unspecified: Secondary | ICD-10-CM | POA: Diagnosis not present

## 2024-03-17 DIAGNOSIS — I480 Paroxysmal atrial fibrillation: Secondary | ICD-10-CM | POA: Diagnosis not present

## 2024-03-17 DIAGNOSIS — M10071 Idiopathic gout, right ankle and foot: Secondary | ICD-10-CM | POA: Diagnosis not present

## 2024-03-17 DIAGNOSIS — R5383 Other fatigue: Secondary | ICD-10-CM | POA: Diagnosis not present

## 2024-03-17 DIAGNOSIS — E782 Mixed hyperlipidemia: Secondary | ICD-10-CM | POA: Diagnosis not present

## 2024-03-17 DIAGNOSIS — I2510 Atherosclerotic heart disease of native coronary artery without angina pectoris: Secondary | ICD-10-CM | POA: Diagnosis not present

## 2024-03-17 DIAGNOSIS — E6609 Other obesity due to excess calories: Secondary | ICD-10-CM | POA: Diagnosis not present

## 2024-03-17 DIAGNOSIS — G629 Polyneuropathy, unspecified: Secondary | ICD-10-CM | POA: Diagnosis not present

## 2024-03-17 DIAGNOSIS — E7849 Other hyperlipidemia: Secondary | ICD-10-CM | POA: Diagnosis not present

## 2024-03-17 DIAGNOSIS — E538 Deficiency of other specified B group vitamins: Secondary | ICD-10-CM | POA: Diagnosis not present

## 2024-03-17 DIAGNOSIS — Z6830 Body mass index (BMI) 30.0-30.9, adult: Secondary | ICD-10-CM | POA: Diagnosis not present

## 2024-03-17 DIAGNOSIS — E1140 Type 2 diabetes mellitus with diabetic neuropathy, unspecified: Secondary | ICD-10-CM | POA: Diagnosis not present

## 2024-03-17 DIAGNOSIS — F419 Anxiety disorder, unspecified: Secondary | ICD-10-CM | POA: Diagnosis not present

## 2024-04-01 ENCOUNTER — Encounter

## 2024-04-06 ENCOUNTER — Ambulatory Visit: Attending: Cardiology | Admitting: *Deleted

## 2024-04-06 DIAGNOSIS — Z5181 Encounter for therapeutic drug level monitoring: Secondary | ICD-10-CM | POA: Insufficient documentation

## 2024-04-06 DIAGNOSIS — I4891 Unspecified atrial fibrillation: Secondary | ICD-10-CM | POA: Diagnosis not present

## 2024-04-06 LAB — POCT INR: INR: 2.3 (ref 2.0–3.0)

## 2024-04-06 NOTE — Patient Instructions (Signed)
Continue warfarin 1 tablet daily Recheck in 4 wks 

## 2024-04-14 ENCOUNTER — Other Ambulatory Visit: Payer: Self-pay | Admitting: Cardiology

## 2024-04-14 ENCOUNTER — Telehealth: Payer: Self-pay | Admitting: Cardiology

## 2024-04-14 NOTE — Telephone Encounter (Signed)
 Patient has not been seen since 10/2022. Was advised to return in 6 months (04/2023)   Message was sent to scheduling as he has not had follow up and was requesting refills.   He needs to be seen and then any testing can be ordered.   You can place him on the wait list and offer him other offices .

## 2024-04-14 NOTE — Telephone Encounter (Signed)
 Called pt to schedule f/u as he is over due and he expressed that he tired all the time even when he doesn't do anything. As well as he is Short of Breath. Pt believes he may need a stress test. I scheduled him for our soonest appt with Dr.McDowell

## 2024-04-16 ENCOUNTER — Ambulatory Visit: Payer: Self-pay | Admitting: Student

## 2024-04-16 ENCOUNTER — Ambulatory Visit (INDEPENDENT_AMBULATORY_CARE_PROVIDER_SITE_OTHER): Admitting: Physician Assistant

## 2024-04-16 ENCOUNTER — Encounter: Payer: Self-pay | Admitting: Physician Assistant

## 2024-04-16 ENCOUNTER — Other Ambulatory Visit (HOSPITAL_COMMUNITY)
Admission: RE | Admit: 2024-04-16 | Discharge: 2024-04-16 | Disposition: A | Discharge status: Home / Self Care | Source: Ambulatory Visit | Attending: Physician Assistant | Admitting: Physician Assistant

## 2024-04-16 VITALS — BP 116/74 | HR 77 | Ht 69.0 in | Wt 229.2 lb

## 2024-04-16 DIAGNOSIS — I2510 Atherosclerotic heart disease of native coronary artery without angina pectoris: Secondary | ICD-10-CM | POA: Insufficient documentation

## 2024-04-16 DIAGNOSIS — I10 Essential (primary) hypertension: Secondary | ICD-10-CM | POA: Insufficient documentation

## 2024-04-16 DIAGNOSIS — G629 Polyneuropathy, unspecified: Secondary | ICD-10-CM

## 2024-04-16 DIAGNOSIS — R600 Localized edema: Secondary | ICD-10-CM

## 2024-04-16 DIAGNOSIS — R0602 Shortness of breath: Secondary | ICD-10-CM

## 2024-04-16 DIAGNOSIS — E119 Type 2 diabetes mellitus without complications: Secondary | ICD-10-CM

## 2024-04-16 DIAGNOSIS — R5383 Other fatigue: Secondary | ICD-10-CM | POA: Diagnosis not present

## 2024-04-16 DIAGNOSIS — I480 Paroxysmal atrial fibrillation: Secondary | ICD-10-CM | POA: Diagnosis not present

## 2024-04-16 DIAGNOSIS — G4719 Other hypersomnia: Secondary | ICD-10-CM | POA: Diagnosis not present

## 2024-04-16 DIAGNOSIS — I6523 Occlusion and stenosis of bilateral carotid arteries: Secondary | ICD-10-CM

## 2024-04-16 LAB — BASIC METABOLIC PANEL WITH GFR
Anion gap: 9 (ref 5–15)
BUN: 14 mg/dL (ref 8–23)
CO2: 27 mmol/L (ref 22–32)
Calcium: 9 mg/dL (ref 8.9–10.3)
Chloride: 102 mmol/L (ref 98–111)
Creatinine, Ser: 1.08 mg/dL (ref 0.61–1.24)
GFR, Estimated: >60 mL/min (ref >60)
Glucose, Bld: 129 mg/dL — ABNORMAL HIGH (ref 70–99)
Potassium: 4 mmol/L (ref 3.5–5.1)
Sodium: 138 mmol/L (ref 135–145)

## 2024-04-16 MED ORDER — METOPROLOL TARTRATE 100 MG PO TABS
100.0000 mg | ORAL_TABLET | ORAL | 0 refills | Status: DC
Start: 2024-04-16 — End: 2024-04-30

## 2024-04-16 NOTE — Patient Instructions (Signed)
 Medication Instructions:   Take Lopressor  100 mg two hours prior to cardiac CTA scan.   *If you need a refill on your cardiac medications before your next appointment, please call your pharmacy*  Lab Work: Your physician recommends that you return for lab work in: [employer])   If you have labs (blood work) drawn today and your tests are completely normal, you will receive your results only by: MyChart Message (if you have MyChart) OR A paper copy in the mail If you have any lab test that is abnormal or we need to change your treatment, we will call you to review the results.  Testing/Procedures:   Your cardiac CT will be scheduled at one of the below locations:   [facility] [address] [place], [place] [address] [phone]  OR  [facility] [address] [place], [place] [address] [phone]  OR   [facility] [address] [place], [place] [address] [phone]  OR   [facility] [address] [place], [place] [address] [phone]  OR   Jeralene Mom. [facility] [address]  [place], [place] [address] Opening March 16, 2024  If scheduled at [facility], please arrive at the [facility] En[facility] (Entrance C2) of [facility] 30 minutes prior to test start time. You can use the FREE valet parking offered at entrance C (encouraged to control the heart rate for the test)  Proceed to the [facility] Radiology Department (first floor) to check-in and test prep.   All radiology patients and guests should use entrance C2 at [facility], accessed from [facility], even though the hospital's physical address listed is [address]    If scheduled at the Heart and Vascular Tower at [address], please enter the parking lot using the [address] entrance and use the FREE valet  service at the patient drop-off area. Enter the buidling and check-in with registration on the main floor.  If scheduled at [facility] or [facility], please arrive 15 mins early for check-in and test prep.  There is spacious parking and easy access to the radiology department from the [facility] entrance. Please enter here and check-in with the desk attendant.   If scheduled at [facility], please arrive 30 minutes early for check-in and test prep.  Please follow these instructions carefully (unless otherwise directed):  An IV will be required for this test and Nitroglycerin  will be given.  Hold all erectile dysfunction medications at least 3 days (72 hrs) prior to test. (Ie viagra, cialis , sildenafil, tadalafil , etc)   On the Night Before the Test: Be sure to Drink plenty of water . Do not consume any caffeinated/decaffeinated beverages or chocolate 12 hours prior to your test. Do not take any antihistamines 12 hours prior to your test. If the patient has contrast allergy: Patient will need a prescription for Prednisone and very clear instructions (as follows): Prednisone 50 mg - take 13 hours prior to test Take another Prednisone 50 mg 7 hours prior to test Take another Prednisone 50 mg 1 hour prior to test Take Benadryl 50 mg 1 hour prior to test Patient must complete all four doses of above prophylactic medications. Patient will need a ride after test due to Benadryl.  On the Day of the Test: Drink plenty of water  until 1  hour prior to the test. Do not eat any food 1 hour prior to test. You may take your regular medications prior to the test.  Take metoprolol  (Lopressor ) two hours prior to test. If you take Furosemide/Hydrochlorothiazide/Spironolactone/Chlorthalidone, please HOLD on the morning of the test. Patients who wear a continuous glucose monitor MUST remove the device prior to scanning. FEMALES- please  wear underwire-free bra if available, avoid dresses & tight clothing  *For Clinical Staff only. Please instruct patient the following:* Heart Rate Medication Recommendations for Cardiac CT  Resting HR < 50 bpm  No medication  Resting HR 50-60 bpm and BP >110/50 mmHG   Consider Metoprolol  tartrate 25 mg PO 90-120 min prior to scan  Resting HR 60-65 bpm and BP >110/50 mmHG  Metoprolol  tartrate 50 mg PO 90-120 minutes prior to scan   Resting HR > 65 bpm and BP >110/50 mmHG  Metoprolol  tartrate 100 mg PO 90-120 minutes prior to scan  Consider Ivabradine 10-15 mg PO or a calcium  channel blocker for resting HR >60 bpm and contraindication to metoprolol  tartrate  Consider Ivabradine 10-15 mg PO in combination with metoprolol  tartrate for HR >80 bpm        After the Test: Drink plenty of water . After receiving IV contrast, you may experience a mild flushed feeling. This is normal. On occasion, you may experience a mild rash up to 24 hours after the test. This is not dangerous. If this occurs, you can take Benadryl 25 mg, Zyrtec, Claritin, or Allegra and increase your fluid intake. (Patients taking Tikosyn should avoid Benadryl, and may take Zyrtec, Claritin, or Allegra) If you experience trouble breathing, this can be serious. If it is severe call 911 IMMEDIATELY. If it is mild, please call our office.  We will call to schedule your test 2-4 weeks out understanding that some insurance companies will need an authorization prior to the service being performed.   For more information and frequently asked questions, please visit our website : [email or web address]  For non-scheduling related questions, please contact the cardiac imaging nurse navigator should you have any questions/concerns: Cardiac Imaging Nurse Navigators Direct Office Dial: [phone]   For scheduling needs, including cancellations and rescheduling, please call [place], [phone].   Follow-Up: At [facility], you and your health needs are our priority.  As part of our continuing mission to provide you with exceptional heart care, our providers are all part of one team.  This team includes your primary Cardiologist (physician) and Advanced Practice Providers or APPs (Physician Assistants and Nurse Practitioners) who all work together to provide you with the care you need, when you need it.  Your next appointment:   3 month(s)  Provider:   You may see Teddie Favre, MD or one of the following Advanced Practice Providers on your designated Care Team:   Woodfin Hays, PA-C  [place], [place] Theotis Flake, [place]     We recommend signing up for the patient portal called "MyChart".  Sign up information is provided on this After Visit Summary.  MyChart is used to connect with patients for Virtual Visits (Telemedicine).  Patients are able to view lab/test results, encounter notes, upcoming appointments, etc.  Non-urgent messages can be sent to your provider as well.   To learn more about what you can do with MyChart, go to [email or web address].   Other Instructions Thank you for choosing [facility]!

## 2024-04-16 NOTE — Progress Notes (Signed)
 Cardiology Office Note:  .   Date:  04/16/2024  ID:  [name], DOB Mar 27, 1941, MRN [id number] PCP: Kathyleen Parkins, MD  [facility] Providers Cardiologist:  Teddie Favre, MD { History of Present Illness: .   [name] is a 83 y.o. male with PMHx of aortic atherosclerosis, Nonobstructive CAD (cath 2020), Bilateral carotid stenosis (08/2019: < 50%), HTN, paroxysmal A-fib, and DM2 reports to office for follow up.   Last seen in heartcare via telehealth 09/04/2023 for preop CVS assessment.  At that time, denied any cardiac complaints.  Noted experiencing a lot of grief since older brother recently passed away.  He had stopped his warfarin in preparation of lumbar epidural injection scheduled for 10/21.  Noted RCRI of 6.6 %, 6 mets and recommended to proceed with surgery.  Recommended to continue warfarin 5 mg after surgery.   Telephone note 04/14/2024 noted patient was called for overdue follow-up and expressed being tired all the time, SOB, and inquired about stress test.   On interview, reported fatigue x months, generalized weakness, numbness/tingliness in both arms and legs.  Also noted SOB when walking upstairs relieved with rest. also noted LE edema mainly in feet x months relieved with leg elevation. Also noted falling asleep easily when watching TV or waiting at traffic light.  Also reported snoring but not interested in sleep study at this time.  Noted dealing with a lot of grief over the past few years including the death of his wife, brother and 2 close friends.  Has little interest or pleasure in doing anything. Stop smoking 20 years ago after 40 years of 1-1.5/PPD.  Denies any EtOH or drugs.  Noted medication compliance.  Noted healthy eating habits.  Not very active due to fatigue.   Studies Reviewed: Aaron Aas   EKG Interpretation Date/Time:  Thursday Apr 16 2024 13:29:59 EDT Ventricular Rate:  59 PR Interval:  238 QRS Duration:  88 QT Interval:  390 QTC  Calculation: 386 R Axis:   14  Text Interpretation: Sinus bradycardia with 1st degree A-V block When compared with ECG of 04-May-2023 02:55, No significant change was found Confirmed by Odetta Benes ([id number]) on 04/16/2024 1:34:26 PM  Cardiac catheterization 07/21/2019: Widely patent left main 40 to 60% proximal LAD with mild calcification.  DFR on LAD stenosis 0.94. Widely patent circumflex Very tortuous proximal right coronary with 30 to 40% proximal narrowing.  No significant obstruction identified. Normal LV function.  Carotid US  08/2019 IMPRESSION: Less than 50% stenosis of the bilateral ICAs.   ECHO IMPRESSIONS 04/2023  1. Left ventricular ejection fraction, by estimation, is 60 to 65%. The  left ventricle has normal function. The left ventricle has no regional  wall motion abnormalities. There is mild left ventricular hypertrophy.  Left ventricular diastolic parameters  are indeterminate.   2. Right ventricular systolic function is normal. The right ventricular  size is normal.   3. The mitral valve is normal in structure. No evidence of mitral valve  regurgitation.   4. The aortic valve is grossly normal. Aortic valve regurgitation is not  visualized.   5. The inferior vena cava is dilated in size with <50% respiratory  variability, suggesting right atrial pressure of 15 mmHg.  Risk Assessment/Calculations:   CHA2DS2-VASc Score = 5  This indicates a 7.2% annual risk of stroke. The patient's score is based upon: CHF History: 0 HTN History: 1 Diabetes History: 1 Stroke History: 0 Vascular Disease History: 1 Age Score: 2 Gender Score: 0  Physical Exam:   VS:  BP 116/74 (BP Location: Left Arm, Patient Position: Sitting, Cuff Size: Normal)   Pulse 77   Ht 5\' 9"  (1.753 m)   Wt 229 lb 3.2 oz (104 kg)   SpO2 95%   BMI 33.85 kg/m    Wt Readings from Last 3 Encounters:  04/16/24 229 lb 3.2 oz (104 kg)  02/12/24 226 lb 12.8 oz (102.9 kg)  10/14/23 223 lb 12.8 oz (101.5  kg)    GEN: Well nourished, well developed in no acute distress NECK: No JVD; No carotid bruits CARDIAC: RRR, no murmurs, rubs, gallops RESPIRATORY:  Clear to auscultation without rales, wheezing or rhonchi  ABDOMEN: Soft, non-tender, non-distended EXTREMITIES:  trace edema bilaterally; No deformity   ASSESSMENT AND PLAN: .   SOB  LE edema  Fatigue  ECHO 04/2023: EF 60 to 65%, mild LVH Reported SOB when walking upstairs relieved with rest.  Also noted LE edema mainly in feet times months relieved with leg elevation.  Also noted fatigue x months 02/2024: TSH & CBC WNL  Ordered echo to evaluate for heart failure.  Ordered cardiac CT due to CAD hx and symptoms  Coronary artery disease involving native coronary artery of native heart without angina pectoris 2020 cath: 40 to 60% proximal LAD with mild calcification, very tortuous proximal RCA with 30 to 40%, recommended aggressive medical management including high intensity statins and SGLT2i, also consider sleep study 02/2024: AST/ALT WNL, LDL 42, Triglyc 192 (Reported he was not fasting.) Continue on Crestor  10 mg daily  Not on ASA due to Coumadin   PAF (paroxysmal atrial fibrillation) (HCC) Previously rate controlled without medications.  EKG in office: Sinus bradycardia with first-degree AV block Denied any palpitations 02/2024: CBC WNL, 03/2024: INR 2.3 CHA2DS2-VASc score 5.  Continue Coumadin  with follow-up INR in the anticoagulation clinic.   Hypertension, unspecified type BP in office today: 116/74 Not on any BP medication.   Bilateral carotid artery stenosis 08/2019 Carotid US : Less than 50% stenosis of the bilateral ICAs. No syncope Continue to monitoring  Excessive Daytime sleepyness  Reported snoring, fatigue, and easily falling asleep.  Not interested in sleep study at this time.  Willing to reconsider after echo and cardiac CT results.   DM2  01/2024: A1C 6.9  Neuropathy  Reported bilateral numbness and tingling in  arms and legs x  months Tried gabapentin but no relief/  Referred to neurology    Dispo: Ordered echo and cardiac CT. refer to neurology.  Follow-up in 3 months  Signed, Metta Actis, PA-C

## 2024-04-23 ENCOUNTER — Ambulatory Visit (INDEPENDENT_AMBULATORY_CARE_PROVIDER_SITE_OTHER): Admitting: Neurology

## 2024-04-23 ENCOUNTER — Encounter: Payer: Self-pay | Admitting: Neurology

## 2024-04-23 VITALS — BP 117/73 | HR 86 | Ht 69.0 in | Wt 223.8 lb

## 2024-04-23 DIAGNOSIS — I4891 Unspecified atrial fibrillation: Secondary | ICD-10-CM | POA: Diagnosis not present

## 2024-04-23 DIAGNOSIS — G5601 Carpal tunnel syndrome, right upper limb: Secondary | ICD-10-CM | POA: Diagnosis not present

## 2024-04-23 DIAGNOSIS — R292 Abnormal reflex: Secondary | ICD-10-CM

## 2024-04-23 DIAGNOSIS — G629 Polyneuropathy, unspecified: Secondary | ICD-10-CM

## 2024-04-23 DIAGNOSIS — R202 Paresthesia of skin: Secondary | ICD-10-CM

## 2024-04-23 DIAGNOSIS — R260 Ataxic gait: Secondary | ICD-10-CM

## 2024-04-23 DIAGNOSIS — E1169 Type 2 diabetes mellitus with other specified complication: Secondary | ICD-10-CM

## 2024-04-23 NOTE — Progress Notes (Signed)
 [facility]  PATIENT: [name] DOB: 1941/10/14  REFERRING DOCTOR OR PCP: Kathyleen Parkins, MD; Odetta Benes PA SOURCE: Patient, notes from primary care, imaging and lab reports, lumbar spine MRI personally reviewed.  _________________________________   HISTORICAL  CHIEF COMPLAINT:  Chief Complaint  Patient presents with   Consult    Pt in room 10.alone. Internal referral for bilateral numbness and tingling in arms and legs. Pt said from knees down tingling/numbness, bilateral feet coldness. Pt said right hand worse than left hand numbness.     HISTORY OF PRESENT ILLNESS:  I had the pleasure of seeing your patient, [name], at [facility] for neurologic consultation regarding his progressive numbness.  He is an 83 year old man who has had numbness in his feet x 2-3 years.   The numbness is worse in his feet but goes up to his knees.   He also has numbness in his fingers/hands, R>L.  His feet feel cold.  He notes paresthesias but not pain.     He notes decreased strength in his hands and feet.   Gait is reduced ad he has difficulty with steps.  He stumbles when he walks  He has no falls.    He uses grips in the shower.     He denies bladder issues but has a long history of BPH with urinary hesitancy and frequency. He has nocturia x years.  This has not changed while other symptoms progressed the last few months.     He is on tamsulosin  and finasteride .    He has had NIDDM and is on glipizide , pioglitazone .    HgbA1c was 6.9 recently though some sugars are 300.     He is on warfarin for AFib.     MRI of the lumbar spine 08/06/2023 showed degenerative changes at L3-L4 with mild spinal stenosis and foraminal narrowing and lateral recess stenosis but no definite nerve root compression.  At L4-L5 there is facet hypertrophy and disc bulging causing mild foraminal narrowing but no nerve root compression milder degenerative changes at other  levels  REVIEW OF SYSTEMS: Constitutional: No fevers, chills, sweats, or change in appetite Eyes: No visual changes, double vision, eye pain Ear, nose and throat: No hearing loss, ear pain, nasal congestion, sore throat Cardiovascular: No chest pain, palpitations Respiratory:  No shortness of breath at rest or with exertion.   No wheezes GastrointestinaI: No nausea, vomiting, diarrhea, abdominal pain, fecal incontinence Genitourinary:  No dysuria, urinary retention or frequency.  No nocturia. Musculoskeletal:  No neck pain, back pain Integumentary: No rash, pruritus, skin lesions Neurological: as above Psychiatric: No depression at this time.  No anxiety Endocrine: No palpitations, diaphoresis, change in appetite, change in weigh or increased thirst Hematologic/Lymphatic:  No anemia, purpura, petechiae. Allergic/Immunologic: No itchy/runny eyes, nasal congestion, recent allergic reactions, rashes  ALLERGIES: Allergies  Allergen Reactions   Prednisone     Patient state that this medication made his blood sugar go real high    HOME MEDICATIONS:  Current Outpatient Medications:    albuterol  (VENTOLIN  HFA) 108 (90 Base) MCG/ACT inhaler, Inhale 1-2 puffs into the lungs., Disp: , Rfl:    allopurinol (ZYLOPRIM) 100 MG tablet, Take 100 mg by mouth daily. , Disp: , Rfl:    ALPRAZolam  (XANAX ) 1 MG tablet, Take 0.5 mg by mouth at bedtime as needed for anxiety or sleep., Disp: , Rfl:    Cholecalciferol (VITAMIN D3) 50 MCG (2000 UT) TABS, Take 2,000 Units by mouth  daily., Disp: , Rfl:    Continuous Glucose Sensor (DEXCOM G7 SENSOR) MISC, by Does not apply route., Disp: , Rfl:    DULoxetine (CYMBALTA) 60 MG capsule, Take 60 mg by mouth daily., Disp: , Rfl:    fexofenadine (ALLEGRA) 180 MG tablet, Take 180 mg by mouth daily., Disp: , Rfl:    finasteride  (PROSCAR ) 5 MG tablet, TAKE ONE TABLET (5MG  TOTAL) BY MOUTH DAILY (Patient taking differently: Take 5 mg by mouth daily.), Disp: 90 tablet, Rfl:  3   gabapentin (NEURONTIN) 300 MG capsule, Take 300 mg by mouth 2 (two) times daily. , Disp: , Rfl:    glipiZIDE  (GLUCOTROL  XL) 5 MG 24 hr tablet, Take 1 tablet (5 mg total) by mouth daily with breakfast., Disp: 90 tablet, Rfl: 1   metFORMIN  (GLUCOPHAGE -XR) 500 MG 24 hr tablet, Take 2 tablets (1,000 mg total) by mouth daily with breakfast., Disp: 180 tablet, Rfl: 3   pantoprazole  (PROTONIX ) 40 MG tablet, TAKE ONE TABLET (40MG  TOTAL) BY MOUTH TWO TIMES DAILY BEFORE A MEAL, Disp: 180 tablet, Rfl: 0   pioglitazone  (ACTOS ) 30 MG tablet, Take 1 tablet (30 mg total) by mouth daily., Disp: 90 tablet, Rfl: 3   tamsulosin  (FLOMAX ) 0.4 MG CAPS capsule, TAKE ONE CAPSULE (0.4MG  TOTAL) BY MOUTH TWO TIMES DAILY, Disp: 180 capsule, Rfl: 3   traMADol  (ULTRAM ) 50 MG tablet, Take 1 tablet (50 mg total) by mouth every 6 (six) hours as needed for moderate pain (pain.)., Disp: 12 tablet, Rfl: 0   Vitamin D , Ergocalciferol , (DRISDOL) 1.25 MG (50000 UNIT) CAPS capsule, Take 50,000 Units by mouth every 7 (seven) days., Disp: , Rfl:    warfarin (COUMADIN ) 5 MG tablet, TAKE ONE TABLET BY MOUTH DAILY EXCEPT TAKE ONE AND ONE-HALF TABLETS ON TUESDAYS AND FRIDAYS OR AS DIRECTED, Disp: 100 tablet, Rfl: 1   cefdinir  (OMNICEF ) 300 MG capsule, Take 300 mg by mouth 2 (two) times daily. (Patient not taking: Reported on 04/23/2024), Disp: , Rfl:    meloxicam (MOBIC) 7.5 MG tablet, Take 7.5 mg by mouth 2 (two) times daily. (Patient not taking: Reported on 04/23/2024), Disp: , Rfl:    methocarbamol  (ROBAXIN ) 500 MG tablet, Take 1 tablet (500 mg total) by mouth 3 (three) times daily. (Patient not taking: Reported on 04/23/2024), Disp: 90 tablet, Rfl: 1   metoprolol  tartrate (LOPRESSOR ) 100 MG tablet, Take 1 tablet (100 mg total) by mouth as directed. Take two hours prior to cardiac CTA Scan (Patient not taking: Reported on 04/23/2024), Disp: 1 tablet, Rfl: 0   nystatin (MYCOSTATIN) 100000 UNIT/ML suspension, Take 5 mLs by mouth 4 (four) times daily.  (Patient not taking: Reported on 04/23/2024), Disp: , Rfl:    rosuvastatin  (CRESTOR ) 10 MG tablet, TAKE ONE TABLET (10MG  TOTAL) BY MOUTH DAILY (Patient not taking: Reported on 04/23/2024), Disp: 30 tablet, Rfl: 0  PAST MEDICAL HISTORY: Past Medical History:  Diagnosis Date   Aortic atherosclerosis (HCC)    Barrett's esophagus    Short-segment, diagnosed in 2007 by Dr. Riley Cheadle   BPH (benign prostatic hyperplasia)    CAD (coronary artery disease)    a. cath on 07/21/2019 showing patent LM and LCx with 40-60% Proximal LAD and 30-40% Pro-RCA stenosis with normal LV function.   Essential hypertension    GERD (gastroesophageal reflux disease)    Hiatal hernia    History of kidney stones    PAF (paroxysmal atrial fibrillation) (HCC)    S/P colonoscopy 2007   S/P endoscopy 08/2010    Salmon-colored epithelium  coming up to 37 cm from the    Type 2 diabetes mellitus (HCC)    Vertigo     PAST SURGICAL HISTORY: Past Surgical History:  Procedure Laterality Date   APPENDECTOMY     BIOPSY  01/28/2020   Procedure: BIOPSY;  Surgeon: Suzette Espy, MD;  Location: [facility];  Service: Endoscopy;;  esophagus   CHOLECYSTECTOMY     COLONOSCOPY  02/06/2006   Dr. Riley Cheadle- single anal papilla, o/w normal rectum, normal colon   COLONOSCOPY N/A 09/13/2016   RMR: three 5-6 mm for adenomas removed.  Recommend one last surveillance colonoscopy in October 2020.   COLONOSCOPY WITH PROPOFOL  N/A 01/28/2020   Procedure: COLONOSCOPY WITH PROPOFOL ;  Surgeon: Suzette Espy, MD;  Location: [facility];  Service: Endoscopy;  Laterality: N/A;  10:45am   CORONARY PRESSURE/FFR STUDY N/A 07/21/2019   Procedure: INTRAVASCULAR PRESSURE WIRE/FFR STUDY;  Surgeon: Arty Binning, MD;  Location: [facility];  Service: Cardiovascular;  Laterality: N/A;   CYSTOSCOPY WITH RETROGRADE PYELOGRAM, URETEROSCOPY AND STENT PLACEMENT Right 08/18/2020   Procedure: CYSTOSCOPY WITH INCISION OF RIGHT URETEROCELE, URETEROSCOPY WITH HOLMIUM  LASER, CYSTOLITHOLAPAXY OF 10 MM BLADDER STONE;  Surgeon: Homero Luster, MD;  Location: [facility];  Service: Urology;  Laterality: Right;   ESOPHAGOGASTRODUODENOSCOPY  09/15/2010   Dr. Riley Cheadle- Salmon-colored epithelium coming up to 37 cm from the    ESOPHAGOGASTRODUODENOSCOPY N/A 07/02/2013   Dr. Riley Cheadle- Barretts on bx. hiatal hernia   ESOPHAGOGASTRODUODENOSCOPY N/A 09/13/2016   RMR: Barrett's esophagus without dysplasia. one last EGD in 3 years.    ESOPHAGOGASTRODUODENOSCOPY (EGD) WITH PROPOFOL  N/A 01/28/2020   Procedure: ESOPHAGOGASTRODUODENOSCOPY (EGD) WITH PROPOFOL ;  Surgeon: Suzette Espy, MD;  [facility];  Service: Endoscopy;  Laterality: N/A;   LEFT HEART CATH AND CORONARY ANGIOGRAPHY N/A 07/21/2019   Procedure: LEFT HEART CATH AND CORONARY ANGIOGRAPHY;  Surgeon: Arty Binning, MD;  Location: [facility];  Service: Cardiovascular;  Laterality: N/A;   POLYPECTOMY  01/28/2020   Procedure: POLYPECTOMY;  Surgeon: Suzette Espy, MD;  [facility]: [facility];  Service: Endoscopy;;  colon   ROTATOR CUFF REPAIR     YAG LASER APPLICATION Right 12/20/2014   Procedure: YAG LASER APPLICATION;  Surgeon: Clay Cummins, MD;  Location: [facility];  Service: Ophthalmology;  Laterality: Right;    FAMILY HISTORY: Family History  Problem Relation Age of Onset   Prostate cancer Father        deceased   Stroke Mother        deceased   Colon cancer Neg Hx     SOCIAL HISTORY: Social History   Socioeconomic History   Marital status: Married    Spouse name: Not on file   Number of children: Not on file   Years of education: Not on file   Highest education level: Not on file  Occupational History   Not on file  Tobacco Use   Smoking status: Former    Current packs/day: 0.00    Average packs/day: 1.5 packs/day for 30.0 years (45.0 ttl pk-yrs)    Types: Cigarettes    Start date: 11/19/1963    Quit date: 11/18/1993    Years since quitting: 30.4   Smokeless tobacco: Never   Tobacco  comments:    quit in the 90's  Vaping Use   Vaping status: Never Used  Substance and Sexual Activity   Alcohol use: No   Drug use: No   Sexual activity: Not on  file  Other Topics Concern   Not on file  Social History Narrative   Pt lives at home alone.   Social Drivers of Corporate investment banker Strain: Low Risk  (08/01/2023)   Overall Financial Resource Strain (CARDIA)    Difficulty of Paying Living Expenses: Not very hard  Food Insecurity: No Food Insecurity (07/12/2023)   Hunger Vital Sign    Worried About Running Out of Food in the Last Year: Never true    Ran Out of Food in the Last Year: Never true  Transportation Needs: No Transportation Needs (09/03/2023)   PRAPARE - Administrator, Civil Service (Medical): No    Lack of Transportation (Non-Medical): No  Physical Activity: Inactive (07/19/2023)   Exercise Vital Sign    Days of Exercise per Week: 0 days    Minutes of Exercise per Session: 0 min  Stress: Not on file  Social Connections: Not on file  Intimate Partner Violence: Not At Risk (05/03/2023)   Humiliation, Afraid, Rape, and Kick questionnaire    Fear of Current or Ex-Partner: No    Emotionally Abused: No    Physically Abused: No    Sexually Abused: No       PHYSICAL EXAM  Vitals:   04/23/24 0952  BP: 117/73  Pulse: 86  Weight: 223 lb 12.8 oz (101.5 kg)  Height: 5\' 9"  (1.753 m)    Body mass index is 33.05 kg/m.   General: The patient is well-developed and well-nourished and in no acute distress  HEENT:  Head is [place]/AT.  Sclera are anicteric.    Neck: No carotid bruits are noted.  The neck is nontender.  Cardiovascular: The heart has a regular rate and rhythm with a normal S1 and S2. There were no murmurs, gallops or rubs.    Skin: Extremities are without rash or  edema.  Musculoskeletal:  Back is nontender  Neurologic Exam  Mental status: The patient is alert and oriented x 3 at the time of the examination. The patient has  apparent normal recent and remote memory, with an apparently normal attention span and concentration ability.   Speech is normal.  Cranial nerves: Extraocular movements are full. Pupils are equal, round, and reactive to light and accomodation.  Visual fields are full.  Facial symmetry is present. There is good facial sensation to soft touch bilaterally.Facial strength is normal.  Trapezius and sternocleidomastoid strength is normal. No dysarthria is noted.  The tongue is midline, and the patient has symmetric elevation of the soft palate. No obvious hearing deficits are noted.  Motor:  Muscle bulk is normal.   Tone is normal. Strength is  5 / 5 in all 4 extremities except 4+/5 APB strength of the right hand  Sensory: Sensory testing is intact to pinprick, soft touch and vibration sensation in the arms above the hand.  He has mildly reduced vibration sensation at the fingertips.  In the legs, he has normal sensation at and above the knees.  He has reduced vibration sensation at the ankles that is further reduced (nearly absent) at the toes.  Pinprick sensation was fairly normal  He has Tinel sign at the right wrist but not the left wrist  Coordination: Cerebellar testing reveals good finger-nose-finger and heel-to-shin bilaterally.  Gait and station: Station is normal.   Romberg is borderline.  He has to look down as he walks and has a slightly wide-based.  The tandem is poor and he needs to look down  reflexes: Deep tendon reflexes are symmetric and normal in the arms.  Reflexes are increased at the knees with crossed adductors.  He has no ankle reflexes..   Plantar responses are flexor.    DIAGNOSTIC DATA (LABS, IMAGING, TESTING) - I reviewed patient records, labs, notes, testing and imaging myself where available.  Lab Results  Component Value Date   WBC 8.9 05/06/2023   HGB 11.1 (L) 05/06/2023   HCT 34.2 (L) 05/06/2023   MCV 96.3 05/06/2023   PLT 131 (L) 05/06/2023      Component  Value Date/Time   NA 138 04/16/2024 1456   NA 141 11/30/2022 1504   K 4.0 04/16/2024 1456   CL 102 04/16/2024 1456   CO2 27 04/16/2024 1456   GLUCOSE 129 (H) 04/16/2024 1456   BUN 14 04/16/2024 1456   BUN 14 11/30/2022 1504   CREATININE 1.08 04/16/2024 1456   CREATININE 1.16 07/03/2019 1343   CALCIUM  9.0 04/16/2024 1456   PROT 6.1 (L) 05/03/2023 0700   PROT 6.2 11/30/2022 1504   ALBUMIN 3.1 (L) 05/06/2023 0430   ALBUMIN 4.1 11/30/2022 1504   AST 23 05/03/2023 0700   ALT 21 05/03/2023 0700   ALKPHOS 53 05/03/2023 0700   BILITOT 1.2 05/03/2023 0700   BILITOT 0.4 11/30/2022 1504   GFRNONAA >60 04/16/2024 1456   GFRAA >60 08/12/2020 1336   No results found for: "CHOL", "HDL", "LDLCALC", "LDLDIRECT", "TRIG", "CHOLHDL" Lab Results  Component Value Date   HGBA1C 6.9 (A) 02/12/2024   No results found for: "VITAMINB12" Lab Results  Component Value Date   TSH 2.420 05/25/2022       ASSESSMENT AND PLAN  Polyneuropathy - Plan: Vitamin B12, Multiple Myeloma Panel (SPEP&IFE w/QIG), Sjogren's syndrome antibods(ssa + ssb), NCV with EMG(electromyography)  Atrial fibrillation, unspecified type (HCC)  Type 2 diabetes mellitus with other specified complication, without long-term current use of insulin  (HCC)  Right carpal tunnel syndrome - Plan: NCV with EMG(electromyography)  Ataxic gait - Plan: MR CERVICAL SPINE WO CONTRAST  Hyperreflexia - Plan: MR CERVICAL SPINE WO CONTRAST  Paresthesia - Plan: Vitamin B12, Multiple Myeloma Panel (SPEP&IFE w/QIG), Sjogren's syndrome antibods(ssa + ssb), MR CERVICAL SPINE WO CONTRAST   In summary, [name] is an 83 year old man with progressive leg numbness.  He also has reduced gait.  Additionally, there is pain and mild weakness in the right hand.  On examination, he appears to have a length dependent large fiber polyneuropathy.  Small fiber is less involved.  Although it is possible that this is being caused by his diabetes, I would be more  concerned about another explanation and we will check vitamin B12 and SPEP/IEF.  CIDP is possible symptoms have progressed over a few months and we will also check an NCV/EMG to determine if the polyneuropathy is axonal or demyelinating..   When he gets the NCV/EMG he could also be evaluated for the probable right carpal tunnel syndrome.  Although he has no deep tendon reflexes at the ankles, the knee reflexes were hyper and I will check an MRI of the cervical spine to determine if there is any evidence of a spinal cord process such as compressive myelopathy.    Follow-up will be arranged after the results of the NCV/EMG and MRI studies based on the findings.  He should also call if new or worsening neurologic symptoms.  Thank you for asking me to see [name].  Please let me know if I can be of further assistance with him or  other patients in the future.   Giabella Duhart A. Godwin Lat, MD, [facility] 04/23/2024, 10:04 AM Certified in Neurology, Clinical Neurophysiology, Sleep Medicine and Neuroimaging  [facility] [address], [address] [place], [place] [address] [phone]

## 2024-04-27 ENCOUNTER — Other Ambulatory Visit: Payer: Self-pay | Admitting: Cardiology

## 2024-04-27 ENCOUNTER — Telehealth: Payer: Self-pay | Admitting: Neurology

## 2024-04-27 ENCOUNTER — Ambulatory Visit: Payer: Self-pay | Admitting: Neurology

## 2024-04-27 LAB — MULTIPLE MYELOMA PANEL, SERUM
Albumin SerPl Elph-Mcnc: 3.4 g/dL (ref 2.9–4.4)
Albumin/Glob SerPl: 1.6 (ref 0.7–1.7)
Alpha 1: 0.2 g/dL (ref 0.0–0.4)
Alpha2 Glob SerPl Elph-Mcnc: 0.6 g/dL (ref 0.4–1.0)
B-Globulin SerPl Elph-Mcnc: 0.9 g/dL (ref 0.7–1.3)
Gamma Glob SerPl Elph-Mcnc: 0.5 g/dL (ref 0.4–1.8)
Globulin, Total: 2.2 g/dL (ref 2.2–3.9)
IgA/Immunoglobulin A, Serum: 182 mg/dL (ref 61–437)
IgG (Immunoglobin G), Serum: 580 mg/dL — ABNORMAL LOW (ref 603–1613)
IgM (Immunoglobulin M), Srm: 58 mg/dL (ref 15–143)
Total Protein: 5.6 g/dL — ABNORMAL LOW (ref 6.0–8.5)

## 2024-04-27 LAB — VITAMIN B12: Vitamin B-12: 588 pg/mL (ref 232–1245)

## 2024-04-27 LAB — SJOGREN'S SYNDROME ANTIBODS(SSA + SSB)
ENA SSA (RO) Ab: <0.2 AI (ref 0.0–0.9)
ENA SSB (LA) Ab: <0.2 AI (ref 0.0–0.9)

## 2024-04-27 NOTE — Telephone Encounter (Signed)
 no auth required sent to GI [phone]

## 2024-04-29 ENCOUNTER — Ambulatory Visit (HOSPITAL_COMMUNITY)
Admission: RE | Admit: 2024-04-29 | Discharge: 2024-04-29 | Disposition: A | Discharge status: Home / Self Care | Source: Ambulatory Visit | Attending: Physician Assistant | Admitting: Physician Assistant

## 2024-04-29 DIAGNOSIS — R600 Localized edema: Secondary | ICD-10-CM | POA: Insufficient documentation

## 2024-04-29 LAB — ECHOCARDIOGRAM COMPLETE
Area-P 1/2: 5.54 cm2
S' Lateral: 3 cm

## 2024-04-29 NOTE — Progress Notes (Signed)
*  PRELIMINARY RESULTS* Echocardiogram 2D Echocardiogram has been performed.  [name] 04/29/2024, 12:29 PM

## 2024-04-30 ENCOUNTER — Telehealth: Payer: Self-pay | Admitting: Cardiology

## 2024-04-30 MED ORDER — METOPROLOL TARTRATE 100 MG PO TABS
100.0000 mg | ORAL_TABLET | ORAL | 0 refills | Status: DC
Start: 2024-04-30 — End: 2024-05-12

## 2024-04-30 NOTE — Telephone Encounter (Signed)
 Pt c/o medication issue:  1. Name of Medication:   metoprolol  tartrate (LOPRESSOR ) 100 MG tablet    2. How are you currently taking this medication (dosage and times per day)? Take 1 tablet (100 mg total) by mouth as directed. Take two hours prior to cardiac CTA ScanPatient not taking: Reported on 04/23/2024   3. Are you having a reaction (difficulty breathing--STAT)? No  4. What is your medication issue? Patient has multiple appointments scheduled and was confused. Patient stated he took this medication for his echo on 04/29/24. Patient would like to get a new prescription sent for the medication. Please advise.

## 2024-04-30 NOTE — Telephone Encounter (Signed)
 Confirmed with patient that cardiac ct is 05/04/24 at [facility] [address]  E-scribed metoprolol  to [facility]

## 2024-04-30 NOTE — Telephone Encounter (Signed)
 05/04/2024  3:30 PM at CT Imaging at [facility]. [facility]

## 2024-05-01 ENCOUNTER — Telehealth (HOSPITAL_COMMUNITY): Payer: Self-pay | Admitting: Emergency Medicine

## 2024-05-01 NOTE — Telephone Encounter (Signed)
 Reaching out to patient to offer assistance regarding upcoming cardiac imaging study; pt verbalizes understanding of appt date/time, parking situation and where to check in, pre-test NPO status and medications ordered, and verified current allergies; name and call back number provided for further questions should they arise Rockwell Alexandria RN Navigator Cardiac Imaging [facility] [phone] office [phone] cell

## 2024-05-03 ENCOUNTER — Ambulatory Visit
Admission: RE | Admit: 2024-05-03 | Discharge: 2024-05-03 | Disposition: A | Discharge status: Home / Self Care | Source: Ambulatory Visit | Attending: Neurology | Admitting: Neurology

## 2024-05-03 DIAGNOSIS — R292 Abnormal reflex: Secondary | ICD-10-CM | POA: Diagnosis not present

## 2024-05-03 DIAGNOSIS — R202 Paresthesia of skin: Secondary | ICD-10-CM

## 2024-05-03 DIAGNOSIS — R260 Ataxic gait: Secondary | ICD-10-CM | POA: Diagnosis not present

## 2024-05-04 ENCOUNTER — Ambulatory Visit (HOSPITAL_COMMUNITY)
Admission: RE | Admit: 2024-05-04 | Discharge: 2024-05-04 | Disposition: A | Discharge status: Home / Self Care | Source: Ambulatory Visit | Attending: Student

## 2024-05-04 ENCOUNTER — Other Ambulatory Visit: Payer: Self-pay | Admitting: Cardiology

## 2024-05-04 ENCOUNTER — Ambulatory Visit (HOSPITAL_COMMUNITY)
Admission: RE | Admit: 2024-05-04 | Discharge: 2024-05-04 | Disposition: A | Discharge status: Home / Self Care | Source: Ambulatory Visit | Attending: Cardiology | Admitting: Cardiology

## 2024-05-04 DIAGNOSIS — R931 Abnormal findings on diagnostic imaging of heart and coronary circulation: Secondary | ICD-10-CM

## 2024-05-04 DIAGNOSIS — I2510 Atherosclerotic heart disease of native coronary artery without angina pectoris: Secondary | ICD-10-CM

## 2024-05-04 MED ORDER — NITROGLYCERIN 0.4 MG SL SUBL
0.8000 mg | SUBLINGUAL_TABLET | Freq: Once | SUBLINGUAL | Status: AC
  Administered 2024-05-04: 0.8 mg via SUBLINGUAL

## 2024-05-04 MED ORDER — IOHEXOL 350 MG/ML SOLN
100.0000 mL | Freq: Once | INTRAVENOUS | Status: AC | PRN
  Administered 2024-05-04: 100 mL via INTRAVENOUS

## 2024-05-05 ENCOUNTER — Telehealth: Payer: Self-pay | Admitting: Cardiology

## 2024-05-05 NOTE — Telephone Encounter (Signed)
 Will forward to provider

## 2024-05-05 NOTE — Telephone Encounter (Signed)
 Patient is returning phone call in regard to CT Coronary Morph. Please advise.

## 2024-05-05 NOTE — Progress Notes (Signed)
 Cardiology Office Note:  .   Date:  05/05/2024  ID:  [name], DOB 1941/11/05, MRN [id number] PCP: Kathyleen Parkins, MD  [facility] Providers Cardiologist:  Teddie Favre, MD {  History of Present Illness: .   [name] is a 83 y.o. male  with PMHx of aortic atherosclerosis, Nonobstructive CAD (cath 2020) with recent cardiac CT 05/04/2024 showing Ca score of 459, significant stenosis in LM, pericardium thickening and diffusely calcified with 11 mm at thickest diameter, Bilateral carotid stenosis (08/2019: < 50%), HTN, paroxysmal A-fib, and DM2 who reports to office for follow up.   Last seen in heartcare OV 04/16/2024 with myself for follow up. Reported chronic fatigue, generalized weakness, numbness/tingliness in both arms and legs, SOB when walking upstairs relieved with rest, and LE edema. Denied any CP. Also reported snoring and daytime sleepiness but not interested in sleep study. Follow up ECHO was overall normal with EF 60-65%. On 05/05/2024, I called to discuss results of cardiac cath, which showed significant stenosis in LM and pericardium thickening and diffusely calcified with 11 mm at thickest diameter. He reported symptoms similar to OV and continue to deny CP.  Recommended L/R cath and offered to consent over the phone but patient preferred to come into office to discuss test results and cath.    Today, patient is accompanied by his son and daughter-in-law. Reports a feeling of indigestion X years that could be related to intermittent chest pain, described as dull, 7/10, lasting 15 minutes about 2-3x per week, associated with exertion and sometimes at rest. Notes that this CP is similar to previous episodes in 2020.  Associated with sweating, however coincides with episodes when outside doing yard work.  Denies any N/V.  Reports shortness of breath when walking up steps in home that is relieved with rest.  No SOB when walking on flat surfaces.  Also, continues to report  chronic fatigue, generalized weakness, numbness/tingliness in both arms and leg, and LE edema. Reports medication compliance, however patient manages medications by hisself and is not sure what all he is taking. Not very active due to fatigue, however he is able to do yard work that is sometimes limited by dizziness. Noted healthy eating habits.  Noted dealing with a lot of grief over the past few years including the death of his wife, brother and 2 close friends. Stop smoking 20 years ago after 40 years of 1-1.5/PPD.  Denies any EtOH or drugs.  Noted medication compliance.    Studies Reviewed: .   Cardiac catheterization 07/21/2019: Widely patent left main 40 to 60% proximal LAD with mild calcification.  DFR on LAD stenosis 0.94. Widely patent circumflex Very tortuous proximal right coronary with 30 to 40% proximal narrowing.  No significant obstruction identified. Normal LV function.   Carotid US  08/2019 IMPRESSION: Less than 50% stenosis of the bilateral ICAs.   ECHO IMPRESSIONS 04/29/2024  1. Left ventricular ejection fraction, by estimation, is 60 to 65%. The  left ventricle has normal function. The left ventricle has no regional  wall motion abnormalities. There is mild concentric left ventricular  hypertrophy. Left ventricular diastolic  parameters are indeterminate.   2. Right ventricular systolic function is normal. The right ventricular  size is normal. Tricuspid regurgitation signal is inadequate for assessing  PA pressure.   3. Left atrial size was upper normal.   4. Mobile interatrial septum without obvious shunt.   5. The mitral valve is grossly normal. No evidence of mitral valve  regurgitation.   6. The aortic valve is tricuspid. There is mild calcification of the  aortic valve. Aortic valve regurgitation is not visualized. Aortic valve  sclerosis is present, with no evidence of aortic valve stenosis.   7. The inferior vena cava is dilated in size with >50% respiratory   variability, suggesting right atrial pressure of 8 mmHg.   Comparison(s): Prior images reviewed side by side. LVEF remains normal  range at 60-65%.   Cardiac CT 05/04/2024 IMPRESSION: 1. Coronary calcium  score of 459. This was 50th percentile for age-, sex, and race-matched controls. 2. Total plaque volume 547 mm3 which is 29th percentile for age- and sex-matched controls (calcified plaque 184mm3; non-calcified plaque 46mm3). TPV is severe. 3. Normal coronary origin with right dominance. 4. Moderate atherosclerosis: 50-69% ostial LM/prox LAD/RCA/mid LCx. 5. Consider symptom-guided anti-ischemic and preventive pharmacotherapy as well as risk factor modification per guideline-directed care. 6. The pericardium is thickened and diffusely calcified measuring 11mm at thickest diameter. Consider Cardiac MRI with contrast to evaluate for constrictive pericarditis. 7. Recommend right and cardiac catheterization due to >50% LM disease and pericardial thickening/calcifications to rule out constrictive pericarditis. 7. This study has been submitted for FFR analysis. FFRCT analysis  1. Left Main: Probable significant stenosis. 2. LAD: Proximal 0.71, Mid 0.60, Distal 0.51. 3. LCX: Proximal 0.75, mid 0.75, Distal 0.72. 4. RCA: No significant stenosis. proximal 0.96, Mid 0.86, Distal 0.86. IMPRESSION: 1. Coronary CTA FFR analysis demonstrates a probable hemodynamically significant flow limiting lesion in the LM (LM FFR 1>>0.71 prox LAD/0.75 prox LCx). 2.  Recommend Cardiac Catheterization.  Risk Assessment/Calculations:    CHA2DS2-VASc Score = 5   This indicates a 7.2% annual risk of stroke. The patient's score is based upon: CHF History: 0 HTN History: 1 Diabetes History: 1 Stroke History: 0 Vascular Disease History: 1 Age Score: 2 Gender Score: 0          Physical Exam:   VS:  There were no vitals taken for this visit.   Wt Readings from Last 3 Encounters:  04/23/24 223 lb  12.8 oz (101.5 kg)  04/16/24 229 lb 3.2 oz (104 kg)  02/12/24 226 lb 12.8 oz (102.9 kg)    GEN: Well nourished, well developed in no acute distress with son and daughter in law present.  NECK: No JVD; No carotid bruits CARDIAC: RRR, no murmurs, rubs, gallops RESPIRATORY:  Clear to auscultation without rales, wheezing or rhonchi  ABDOMEN: Soft, non-tender, non-distended EXTREMITIES:  No edema; No deformity   ASSESSMENT AND PLAN: .   Coronary artery disease involving native coronary artery of native heart without angina pectoris Abnormal cardiac CT  2020 cath: 40 to 60% proximal LAD with mild calcification, very tortuous proximal RCA with 30 to 40%, recommended aggressive medical management including high intensity statins and SGLT2i, also consider sleep study Cardiac CT 05/04/2024: Coronary calcum score of 459, which is 50th percentile. Moderate atherosclerosis with 50-69% ostial LM/prox LAD/RCA/mid LCx. Pericardium thickening and diffusely calcified 11 mm at thickest diameter. LM FFR showed significant stenosis. Recommended right and left cardiac catheterization due to > 50% LM and rule out constrictive pericarditis. Per Dr. Londa Rival, recommend setting up outpatient cath.  02/2024: AST/ALT WNL, LDL 42, Triglyc 192 (reported not fasting.) Today, reports a feeling of indigestion X years that could be related to intermittent chest pain, described as dull, 7/10, lasting 15 minutes about 2-3x per week, associated with exertion and sometimes at rest. Notes that this CP is similar to previous episodes in 2020.  Associated with sweating, however coincides with episodes when outside doing yard work.  Denies any N/V.  Reports shortness of breath when walking up steps in home that is relieved with rest.  No SOB when walking on flat surfaces.  Discussed L/R cath procedure, benefits and risk. Patient and family agrees to proceed with cath.  Order CBC and CMP  Scheduled L/R cath on 05/15/2024 @ 9 am with Dr.  Arlester Ladd Dicussed the following medications adjustments for cath  Hold medications the morning of cath: Glipizide , Pioglitazone  Stop medication 24 hours before and resume 48 hours after cath: Metformin   Stop Warfarin 5 day prior to procedure. Last dose on 6/21.  Take ASA 81 mg the morning of cath only unless advised further after cath.  Continue on Crestor  10 mg daily  Not on ASA due to Coumadin   SOB  LE edema  Fatigue  ECHO 04/2023: EF 60 to 65%, mild LVH ECHO 04/29/2024: EF 60-65%, mild LVH, LA size upper normal, mobile interatrial septum without shunt, mild aortic calcification/sclerosis without AS Reports SOB when walking upstairs relieved with rest.  Also notes LE edema mainly in feet times months relieved with leg elevation.  Also note fatigue x months 02/2024: TSH & CBC WNL  No signs of heart failure on ECHO. Based on cardiac CT, it is most likely that symptoms are related to CAD. See above.   PAF (paroxysmal atrial fibrillation) (HCC) Previously rate controlled without medications.  EKG in office 04/16/2024: Sinus bradycardia with first-degree AV block Denied any palpitations 02/2024: CBC WNL, 03/2024: INR 2.3 CHA2DS2-VASc score 5.   Hold warfarin 5 day prior to cath as above.  Contacted Coumadin  clinic to verify hold time and the next appointment for patient. Awaiting response.    Hypertension, unspecified type BP in office today: 128/80 Not on any BP medication.    Bilateral carotid artery stenosis 08/2019 Carotid US : Less than 50% stenosis of the bilateral ICAs. No syncope Continue to monitoring   Excessive Daytime sleepyness  Previously reported snoring, fatigue, and easily falling asleep.  Family reports significant concern about excessive drowsiness and falling asleep easily even in car at light.  Not interested in sleep study at this time.  Willing to reconsider after cardiac catheterization.     DM2  01/2024: A1C 6.9   Neuropathy  Previously, reported bilateral  numbness and tingling in arms and legs x  months. Tried gabapentin but no relief.  Referred to neurology last OV.     Informed Consent   Shared Decision Making/Informed Consent The risks [stroke (1 in 1000), death (1 in 1000), kidney failure [usually temporary] (1 in 500), bleeding (1 in 200), allergic reaction [possibly serious] (1 in 200)], benefits (diagnostic support and management of coronary artery disease) and alternatives of a cardiac catheterization were discussed in detail with [name] and he is willing to proceed.   Dispo: Follow up in 4-6 weeks  Signed, Metta Actis, PA-C

## 2024-05-05 NOTE — H&P (View-Only) (Signed)
 Cardiology Office Note:  .   Date:  05/05/2024  ID:  [name], DOB 1941/11/05, MRN [id number] PCP: Kathyleen Parkins, MD  [facility] Providers Cardiologist:  Teddie Favre, MD {  History of Present Illness: .   [name] is a 83 y.o. male  with PMHx of aortic atherosclerosis, Nonobstructive CAD (cath 2020) with recent cardiac CT 05/04/2024 showing Ca score of 459, significant stenosis in LM, pericardium thickening and diffusely calcified with 11 mm at thickest diameter, Bilateral carotid stenosis (08/2019: < 50%), HTN, paroxysmal A-fib, and DM2 who reports to office for follow up.   Last seen in heartcare OV 04/16/2024 with myself for follow up. Reported chronic fatigue, generalized weakness, numbness/tingliness in both arms and legs, SOB when walking upstairs relieved with rest, and LE edema. Denied any CP. Also reported snoring and daytime sleepiness but not interested in sleep study. Follow up ECHO was overall normal with EF 60-65%. On 05/05/2024, I called to discuss results of cardiac cath, which showed significant stenosis in LM and pericardium thickening and diffusely calcified with 11 mm at thickest diameter. He reported symptoms similar to OV and continue to deny CP.  Recommended L/R cath and offered to consent over the phone but patient preferred to come into office to discuss test results and cath.    Today, patient is accompanied by his son and daughter-in-law. Reports a feeling of indigestion X years that could be related to intermittent chest pain, described as dull, 7/10, lasting 15 minutes about 2-3x per week, associated with exertion and sometimes at rest. Notes that this CP is similar to previous episodes in 2020.  Associated with sweating, however coincides with episodes when outside doing yard work.  Denies any N/V.  Reports shortness of breath when walking up steps in home that is relieved with rest.  No SOB when walking on flat surfaces.  Also, continues to report  chronic fatigue, generalized weakness, numbness/tingliness in both arms and leg, and LE edema. Reports medication compliance, however patient manages medications by hisself and is not sure what all he is taking. Not very active due to fatigue, however he is able to do yard work that is sometimes limited by dizziness. Noted healthy eating habits.  Noted dealing with a lot of grief over the past few years including the death of his wife, brother and 2 close friends. Stop smoking 20 years ago after 40 years of 1-1.5/PPD.  Denies any EtOH or drugs.  Noted medication compliance.    Studies Reviewed: .   Cardiac catheterization 07/21/2019: Widely patent left main 40 to 60% proximal LAD with mild calcification.  DFR on LAD stenosis 0.94. Widely patent circumflex Very tortuous proximal right coronary with 30 to 40% proximal narrowing.  No significant obstruction identified. Normal LV function.   Carotid US  08/2019 IMPRESSION: Less than 50% stenosis of the bilateral ICAs.   ECHO IMPRESSIONS 04/29/2024  1. Left ventricular ejection fraction, by estimation, is 60 to 65%. The  left ventricle has normal function. The left ventricle has no regional  wall motion abnormalities. There is mild concentric left ventricular  hypertrophy. Left ventricular diastolic  parameters are indeterminate.   2. Right ventricular systolic function is normal. The right ventricular  size is normal. Tricuspid regurgitation signal is inadequate for assessing  PA pressure.   3. Left atrial size was upper normal.   4. Mobile interatrial septum without obvious shunt.   5. The mitral valve is grossly normal. No evidence of mitral valve  regurgitation.   6. The aortic valve is tricuspid. There is mild calcification of the  aortic valve. Aortic valve regurgitation is not visualized. Aortic valve  sclerosis is present, with no evidence of aortic valve stenosis.   7. The inferior vena cava is dilated in size with >50% respiratory   variability, suggesting right atrial pressure of 8 mmHg.   Comparison(s): Prior images reviewed side by side. LVEF remains normal  range at 60-65%.   Cardiac CT 05/04/2024 IMPRESSION: 1. Coronary calcium  score of 459. This was 50th percentile for age-, sex, and race-matched controls. 2. Total plaque volume 547 mm3 which is 29th percentile for age- and sex-matched controls (calcified plaque 184mm3; non-calcified plaque 46mm3). TPV is severe. 3. Normal coronary origin with right dominance. 4. Moderate atherosclerosis: 50-69% ostial LM/prox LAD/RCA/mid LCx. 5. Consider symptom-guided anti-ischemic and preventive pharmacotherapy as well as risk factor modification per guideline-directed care. 6. The pericardium is thickened and diffusely calcified measuring 11mm at thickest diameter. Consider Cardiac MRI with contrast to evaluate for constrictive pericarditis. 7. Recommend right and cardiac catheterization due to >50% LM disease and pericardial thickening/calcifications to rule out constrictive pericarditis. 7. This study has been submitted for FFR analysis. FFRCT analysis  1. Left Main: Probable significant stenosis. 2. LAD: Proximal 0.71, Mid 0.60, Distal 0.51. 3. LCX: Proximal 0.75, mid 0.75, Distal 0.72. 4. RCA: No significant stenosis. proximal 0.96, Mid 0.86, Distal 0.86. IMPRESSION: 1. Coronary CTA FFR analysis demonstrates a probable hemodynamically significant flow limiting lesion in the LM (LM FFR 1>>0.71 prox LAD/0.75 prox LCx). 2.  Recommend Cardiac Catheterization.  Risk Assessment/Calculations:    CHA2DS2-VASc Score = 5   This indicates a 7.2% annual risk of stroke. The patient's score is based upon: CHF History: 0 HTN History: 1 Diabetes History: 1 Stroke History: 0 Vascular Disease History: 1 Age Score: 2 Gender Score: 0          Physical Exam:   VS:  There were no vitals taken for this visit.   Wt Readings from Last 3 Encounters:  04/23/24 223 lb  12.8 oz (101.5 kg)  04/16/24 229 lb 3.2 oz (104 kg)  02/12/24 226 lb 12.8 oz (102.9 kg)    GEN: Well nourished, well developed in no acute distress with son and daughter in law present.  NECK: No JVD; No carotid bruits CARDIAC: RRR, no murmurs, rubs, gallops RESPIRATORY:  Clear to auscultation without rales, wheezing or rhonchi  ABDOMEN: Soft, non-tender, non-distended EXTREMITIES:  No edema; No deformity   ASSESSMENT AND PLAN: .   Coronary artery disease involving native coronary artery of native heart without angina pectoris Abnormal cardiac CT  2020 cath: 40 to 60% proximal LAD with mild calcification, very tortuous proximal RCA with 30 to 40%, recommended aggressive medical management including high intensity statins and SGLT2i, also consider sleep study Cardiac CT 05/04/2024: Coronary calcum score of 459, which is 50th percentile. Moderate atherosclerosis with 50-69% ostial LM/prox LAD/RCA/mid LCx. Pericardium thickening and diffusely calcified 11 mm at thickest diameter. LM FFR showed significant stenosis. Recommended right and left cardiac catheterization due to > 50% LM and rule out constrictive pericarditis. Per Dr. Londa Rival, recommend setting up outpatient cath.  02/2024: AST/ALT WNL, LDL 42, Triglyc 192 (reported not fasting.) Today, reports a feeling of indigestion X years that could be related to intermittent chest pain, described as dull, 7/10, lasting 15 minutes about 2-3x per week, associated with exertion and sometimes at rest. Notes that this CP is similar to previous episodes in 2020.  Associated with sweating, however coincides with episodes when outside doing yard work.  Denies any N/V.  Reports shortness of breath when walking up steps in home that is relieved with rest.  No SOB when walking on flat surfaces.  Discussed L/R cath procedure, benefits and risk. Patient and family agrees to proceed with cath.  Order CBC and CMP  Scheduled L/R cath on 05/15/2024 @ 9 am with Dr.  Arlester Ladd Dicussed the following medications adjustments for cath  Hold medications the morning of cath: Glipizide , Pioglitazone  Stop medication 24 hours before and resume 48 hours after cath: Metformin   Stop Warfarin 5 day prior to procedure. Last dose on 6/21.  Take ASA 81 mg the morning of cath only unless advised further after cath.  Continue on Crestor  10 mg daily  Not on ASA due to Coumadin   SOB  LE edema  Fatigue  ECHO 04/2023: EF 60 to 65%, mild LVH ECHO 04/29/2024: EF 60-65%, mild LVH, LA size upper normal, mobile interatrial septum without shunt, mild aortic calcification/sclerosis without AS Reports SOB when walking upstairs relieved with rest.  Also notes LE edema mainly in feet times months relieved with leg elevation.  Also note fatigue x months 02/2024: TSH & CBC WNL  No signs of heart failure on ECHO. Based on cardiac CT, it is most likely that symptoms are related to CAD. See above.   PAF (paroxysmal atrial fibrillation) (HCC) Previously rate controlled without medications.  EKG in office 04/16/2024: Sinus bradycardia with first-degree AV block Denied any palpitations 02/2024: CBC WNL, 03/2024: INR 2.3 CHA2DS2-VASc score 5.   Hold warfarin 5 day prior to cath as above.  Contacted Coumadin  clinic to verify hold time and the next appointment for patient. Awaiting response.    Hypertension, unspecified type BP in office today: 128/80 Not on any BP medication.    Bilateral carotid artery stenosis 08/2019 Carotid US : Less than 50% stenosis of the bilateral ICAs. No syncope Continue to monitoring   Excessive Daytime sleepyness  Previously reported snoring, fatigue, and easily falling asleep.  Family reports significant concern about excessive drowsiness and falling asleep easily even in car at light.  Not interested in sleep study at this time.  Willing to reconsider after cardiac catheterization.     DM2  01/2024: A1C 6.9   Neuropathy  Previously, reported bilateral  numbness and tingling in arms and legs x  months. Tried gabapentin but no relief.  Referred to neurology last OV.     Informed Consent   Shared Decision Making/Informed Consent The risks [stroke (1 in 1000), death (1 in 1000), kidney failure [usually temporary] (1 in 500), bleeding (1 in 200), allergic reaction [possibly serious] (1 in 200)], benefits (diagnostic support and management of coronary artery disease) and alternatives of a cardiac catheterization were discussed in detail with Mr. Raatz and he is willing to proceed.   Dispo: Follow up in 4-6 weeks  Signed, Metta Actis, PA-C

## 2024-05-06 ENCOUNTER — Ambulatory Visit: Admitting: Physician Assistant

## 2024-05-06 ENCOUNTER — Other Ambulatory Visit (HOSPITAL_COMMUNITY)
Admission: RE | Admit: 2024-05-06 | Discharge: 2024-05-06 | Disposition: A | Discharge status: Home / Self Care | Source: Ambulatory Visit | Attending: Physician Assistant | Admitting: Physician Assistant

## 2024-05-06 ENCOUNTER — Encounter: Payer: Self-pay | Admitting: Physician Assistant

## 2024-05-06 VITALS — BP 128/80 | HR 72 | Ht 69.0 in | Wt 225.0 lb

## 2024-05-06 DIAGNOSIS — I6523 Occlusion and stenosis of bilateral carotid arteries: Secondary | ICD-10-CM

## 2024-05-06 DIAGNOSIS — I480 Paroxysmal atrial fibrillation: Secondary | ICD-10-CM | POA: Diagnosis not present

## 2024-05-06 DIAGNOSIS — G629 Polyneuropathy, unspecified: Secondary | ICD-10-CM | POA: Diagnosis not present

## 2024-05-06 DIAGNOSIS — R5383 Other fatigue: Secondary | ICD-10-CM

## 2024-05-06 DIAGNOSIS — R931 Abnormal findings on diagnostic imaging of heart and coronary circulation: Secondary | ICD-10-CM | POA: Insufficient documentation

## 2024-05-06 DIAGNOSIS — R0602 Shortness of breath: Secondary | ICD-10-CM | POA: Diagnosis not present

## 2024-05-06 DIAGNOSIS — I10 Essential (primary) hypertension: Secondary | ICD-10-CM

## 2024-05-06 DIAGNOSIS — E119 Type 2 diabetes mellitus without complications: Secondary | ICD-10-CM | POA: Diagnosis not present

## 2024-05-06 DIAGNOSIS — R600 Localized edema: Secondary | ICD-10-CM | POA: Diagnosis not present

## 2024-05-06 DIAGNOSIS — I2510 Atherosclerotic heart disease of native coronary artery without angina pectoris: Secondary | ICD-10-CM

## 2024-05-06 DIAGNOSIS — G4719 Other hypersomnia: Secondary | ICD-10-CM

## 2024-05-06 LAB — CBC
HCT: 40.8 % (ref 39.0–52.0)
Hemoglobin: 13.7 g/dL (ref 13.0–17.0)
MCH: 31.7 pg (ref 26.0–34.0)
MCHC: 33.6 g/dL (ref 30.0–36.0)
MCV: 94.4 fL (ref 80.0–100.0)
Platelets: 143 10*3/uL — ABNORMAL LOW (ref 150–400)
RBC: 4.32 MIL/uL (ref 4.22–5.81)
RDW: 13.7 % (ref 11.5–15.5)
WBC: 5.2 10*3/uL (ref 4.0–10.5)
nRBC: 0 % (ref 0.0–0.2)

## 2024-05-06 LAB — BASIC METABOLIC PANEL WITH GFR
Anion gap: 7 (ref 5–15)
BUN: 13 mg/dL (ref 8–23)
CO2: 26 mmol/L (ref 22–32)
Calcium: 9.2 mg/dL (ref 8.9–10.3)
Chloride: 107 mmol/L (ref 98–111)
Creatinine, Ser: 1.09 mg/dL (ref 0.61–1.24)
GFR, Estimated: >60 mL/min (ref >60)
Glucose, Bld: 203 mg/dL — ABNORMAL HIGH (ref 70–99)
Potassium: 4 mmol/L (ref 3.5–5.1)
Sodium: 140 mmol/L (ref 135–145)

## 2024-05-06 NOTE — Patient Instructions (Addendum)
 Medication Instructions:  Your physician recommends that you continue on your current medications as directed. Please refer to the Current Medication list given to you today.  *If you need a refill on your cardiac medications before your next appointment, please call your pharmacy*  Lab Work: CBC BMET  If you have labs (blood work) drawn today and your tests are completely normal, you will receive your results only by: MyChart Message (if you have MyChart) OR A paper copy in the mail If you have any lab test that is abnormal or we need to change your treatment, we will call you to review the results.  Testing/Procedures: L/RHC  Follow-Up: At [facility], you and your health needs are our priority.  As part of our continuing mission to provide you with exceptional heart care, our providers are all part of one team.  This team includes your primary Cardiologist (physician) and Advanced Practice Providers or APPs (Physician Assistants and Nurse Practitioners) who all work together to provide you with the care you need, when you need it.  Your next appointment:   4-6 week   Provider:   You may see Teddie Favre, MD or one of the following Advanced Practice Providers on your designated Care Team:   Woodfin Hays, PA-C  [place], [place] Theotis Flake, [place]     We recommend signing up for the patient portal called MyChart.  Sign up information is provided on this After Visit Summary.  MyChart is used to connect with patients for Virtual Visits (Telemedicine).  Patients are able to view lab/test results, encounter notes, upcoming appointments, etc.  Non-urgent messages can be sent to your provider as well.   To learn more about what you can do with MyChart, go to [email or web address].   Other Instructions     [facility] [place] [facility] A DEPT OF . [facility] [address] [place] [address] Dept:  [phone] Loc: [phone]  [name]  05/06/2024  You are scheduled for a Cardiac Catheterization on Friday, June 27 with Dr. Arnoldo Lapping.  1. Please arrive at the [facility] (Main Entrance A) at [facility]: [address] [place], [place] [address] at 7:00 (This time is 2 hour(s) before your procedure to ensure your preparation).   Free valet parking service is available. You will check in at ADMITTING. The support person will be asked to wait in the waiting room.  It is OK to have someone drop you off and come back when you are ready to be discharged.    Special note: Every effort is made to have your procedure done on time. Please understand that emergencies sometimes delay scheduled procedures.  2. Diet: Do not eat solid foods after midnight.  The patient may have clear liquids until 5am upon the day of the procedure.  3. Labs: You will need to have blood drawn on TODAY at [facility]. You do not need to be fasting.  4. Medication instructions in preparation for your procedure:   Contrast Allergy: No   Stop taking Coumadin  (Warfarin) on Sunday, June 22.  Do not take Diabetes Med Glucophage  (Metformin ) on the day of the procedure and HOLD 48 HOURS AFTER THE PROCEDURE. Do not take a Diabetes Med Glipizide  on the day of the procedure. Do not take a Diabetes Med Actos  on the day of the procedure.  On the morning of your procedure, take your Aspirin  81 mg and any  morning medicines NOT listed above.  You may use sips of water .  5. Plan to go home the same day, you will only stay overnight if medically necessary. 6. Bring a current list of your medications and current insurance cards. 7. You MUST have a responsible person to drive you home. 8. Someone MUST be with you the first 24 hours after you arrive home or your discharge will be delayed. 9. Please wear clothes that are easy to get on and off and wear slip-on shoes.  Thank you for allowing us  to  care for you!   -- [facility]

## 2024-05-07 ENCOUNTER — Ambulatory Visit: Payer: Self-pay | Admitting: Physician Assistant

## 2024-05-07 ENCOUNTER — Telehealth: Payer: Self-pay | Admitting: Cardiology

## 2024-05-07 ENCOUNTER — Other Ambulatory Visit: Payer: Self-pay | Admitting: Cardiology

## 2024-05-07 NOTE — Telephone Encounter (Signed)
 The patient has been notified of the result and verbalized understanding.  All questions (if any) were answered. Carole Churches, LPN 0/[id number]/1191 4:78 PM

## 2024-05-07 NOTE — Telephone Encounter (Signed)
 Pt is returning nurse call and is requesting a callback at [phone]. Please advise

## 2024-05-11 ENCOUNTER — Encounter

## 2024-05-14 ENCOUNTER — Encounter: Payer: Self-pay | Admitting: Urology

## 2024-05-14 ENCOUNTER — Ambulatory Visit (INDEPENDENT_AMBULATORY_CARE_PROVIDER_SITE_OTHER): Admitting: Urology

## 2024-05-14 VITALS — BP 123/73 | HR 92

## 2024-05-14 DIAGNOSIS — N5201 Erectile dysfunction due to arterial insufficiency: Secondary | ICD-10-CM

## 2024-05-14 DIAGNOSIS — N138 Other obstructive and reflux uropathy: Secondary | ICD-10-CM | POA: Diagnosis not present

## 2024-05-14 DIAGNOSIS — R972 Elevated prostate specific antigen [PSA]: Secondary | ICD-10-CM

## 2024-05-14 DIAGNOSIS — R109 Unspecified abdominal pain: Secondary | ICD-10-CM | POA: Diagnosis not present

## 2024-05-14 DIAGNOSIS — Z87442 Personal history of urinary calculi: Secondary | ICD-10-CM | POA: Diagnosis not present

## 2024-05-14 DIAGNOSIS — R3912 Poor urinary stream: Secondary | ICD-10-CM

## 2024-05-14 DIAGNOSIS — Q631 Lobulated, fused and horseshoe kidney: Secondary | ICD-10-CM

## 2024-05-14 DIAGNOSIS — R351 Nocturia: Secondary | ICD-10-CM | POA: Diagnosis not present

## 2024-05-14 DIAGNOSIS — N401 Enlarged prostate with lower urinary tract symptoms: Secondary | ICD-10-CM | POA: Diagnosis not present

## 2024-05-14 DIAGNOSIS — N200 Calculus of kidney: Secondary | ICD-10-CM

## 2024-05-14 MED ORDER — TAMSULOSIN HCL 0.4 MG PO CAPS: 0.8000 mg | ORAL_CAPSULE | Freq: Every day | ORAL | 3 refills | Status: AC

## 2024-05-14 MED ORDER — NONFORMULARY OR COMPOUNDED ITEM
3 refills | Status: AC
Start: 2024-05-14 — End: ?

## 2024-05-14 MED ORDER — FINASTERIDE 5 MG PO TABS: 5.0000 mg | ORAL_TABLET | Freq: Every day | ORAL | 3 refills | Status: AC

## 2024-05-14 NOTE — Progress Notes (Signed)
 H&P 1. BPH with urinary obstruction   2. Nocturia   3. Weak urinary stream   4. Elevated PSA   5. Horseshoe kidney with renal calculus   6. Erectile dysfunction due to arterial insufficiency   7. Flank pain   8. History of nephrolithiasis      Chief Complaint: LUTS  History of Present Illness:   05/14/24: [name] returns today in f/u for his history of BPH with BOO that has been treated with finasteride  and tamsulosin  0.8mg  daily.  He has ED that has failed oral meds and trimix and quadmix. His last PSA was 3.1 in 2/24 which was down from 5.2 in 9/23.  His prostate volume on a CT in 2022 was 66ml.  He has a history of stones with a horseshoe kidney with a narrow isthmus.  His IPSS is 17 with nocturia x 3.  He has occasional left flank pain but no hematuria.  He did a flowrate with only 45ml voided and his PF was only 67ml/sec. He had trilobar hyperplasia on cystoscopy last year.  He is having a cardiac stent placed tomorrow.   1/18/24[name] returns today in f/u.  He remains on finasteride  and was changed to silodosin  but he hasn't had much improvement.  His IPSS is 24 with a reduced stream.   Trimix has not worked for  him.   He didn't get the PSA I ordered prior to this visit.   11/1/23[name] returns today in f/u.  He has ED that has failed oral meds and was given a trimix test injection at his last visit with a partial response.  He reports that at home with a full syringe he still only gets a partial erection.   He is having some increased nocturia up to 5x with urgency.  He remains on tamsulosin  or finasteride  which was added last year.  He has some hesitancy.  HE has no hematuria or dysuria.  He has some mild discomfort in the left groin.  His prostate volume is about 62ml on his CT in 2/22.  The UA today is clear.    9/28/23[name] returns today for a penile test injection with trimix.   His PSA was up to 5.2 from 3.2 at his last visit.  He is on finasteride .    07/26/22: [name]  returns today in f/u.  He was supposed to have a PSA prior to this visit but I don't see that was done.  He had the tadalafil  increased to 20mg  at his last visit but has had an insufficient response.  He gets a partial erection but it is not sufficient for penetration and has difficulty maintaining it.  He has reduced sensation of orgasm and minimal fluid.     7/20/23[name] returns today in f/u.  He has some progressive ED.  He didn't respond to tadalafil  5mg  or sildenafil.  He remains of finasteride  and tamsulosin .  His IPSS is 17 with a reduced stream, intermittency and urgency.  He has nocturia x 2.  He has had no flank pain or hematuria.  He has a history of stones and a horseshoe kidney.  He remains on warfarin.   3/16/23[name] returns today in f/u.  His PSA was down to 3.2 on 08/10/21 from 6.3 on 12/04/19.  He reports malodorous urine but no hematuria or dysuria.  He remains on finasteride  and tamsulosin  0.8mg .  The nocturia is down to 1-2x.  He has a decent stream during the day.  He  has had no flank pain but has a history of stones in a horseshoe kidney.  He has ED and has tadalafil  and sildenafil but hasn't had much response.  The tadalafil  was expensive.   08/10/21: Dwith returns today in f/u.  He is current on finasteride  and tamsulosin  0.8 mg daily for his BPH with BOO and he reports improvement in his symptoms with the addition of the finasteride .  His IPSS is down to 23 from 30.  He has a good stream in the day that is slow at night.  He has nocturia x 3.  He has intermittency at night.  He has no incontinence.  He has had no hematuria or flank pain.  He can get a minimal partial erection and is inquiring about options.    02/02/21: [name] returns today in f/u.  He was given doxycyline on 11/04/20 for possible diverticulitis.   He had the CT I ordered on 12/26/20 and it showed diverticular disease without diverticulitis.  He has a horseshoe kidney with no residual stone disease or obstruction.   He has fairly severe LUTS with an IPSS of 30.  His urgency, frequency and intermittency.  He has a reduced stream at night and nocturia x 3.  He remains on tamsulosin .  He feels generally poorly today but has no fever.  He has no hematuria or dysuria.   12/17/21[name] returns today with a 1 week history of LLQ pain and hesitancy with frequent small voids.  He is not sleeping well because of the pain.  He has a history of stones and prostatitis.  He UA has 0-2 RBC's.  He had the Covid booster a week ago and had fever with that.  He has had no nausea.  He has a horseshoe kidney that had no left stones on a CT in 9/21.  He did have diverticular disease.   09/02/20: [name] returns today in f/u from recent right ureteral stone extraction.  The stone was impacted and I had to incise the ureteral meatus to release the stone and then fragment it to get it out of the bladder.   He has no hematuria.   He has back pain but no flank pain .  He is voiding well.  His IPSS is 21.  07/01/20: [name] returns today in f/u.  He reports resolution of the penile lesion.   He continues to have nocturia but he is getting up a lot with his wife who has to get up.   He remains on tamsulosin .  His IPSS is 24.  He has been having some progressive SOB and Dr. Darlean had some questions about whether the tamsulosin  may be contributing to the breathing issues.   His PSA in 1/21 was 6.3 which was down from 9.7.  He has some LLQ discomfort at times.  He has a horseshoe kidney and a right renal stone on a CT in 2015.   3.23.2021: Most recent PSA was 6.3 on 1.5.2021. He presents today c/o of sore spot underneath his penis that he has had for the last 2 wks. This has been too painful to touch. He has tried managing pain with medications and hot baths without success. He has also tried to pop it without producing any drainage. This pain has started to lessen in severity but he has recently begun having some mild dysuria and increased nocturia.  Otherwise, his urinary sx's are stable.      01/27/19 10/23/18 10/17/17 09/18/16 09/06/15 03/02/15 04/03/14 06/13/12  PSA  Total PSA 6.4 ng/dl 9.7 ng/dl 5.3 ng/dl 5.8  5.33  4.35  4.79  4.36   Free PSA    1.0  0.56  0.58  0.64  0.62   % Free PSA  8 %  17  12  10  12  14        ROS: Otherwise negative.    Past Medical History:  Diagnosis Date   Aortic atherosclerosis (HCC)    Barrett's esophagus    Short-segment, diagnosed in 2007 by Dr. Shaaron   BPH (benign prostatic hyperplasia)    CAD (coronary artery disease)    a. cath on 07/21/2019 showing patent LM and LCx with 40-60% Proximal LAD and 30-40% Pro-RCA stenosis with normal LV function.   Essential hypertension    GERD (gastroesophageal reflux disease)    Hiatal hernia    History of kidney stones    PAF (paroxysmal atrial fibrillation) (HCC)    S/P colonoscopy 2007   S/P endoscopy 08/2010    Salmon-colored epithelium coming up to 37 cm from the    Type 2 diabetes mellitus (HCC)    Vertigo     Past Surgical History:  Procedure Laterality Date   APPENDECTOMY     BIOPSY  01/28/2020   Procedure: BIOPSY;  Surgeon: Shaaron Lamar HERO, MD;  Location: [facility];  Service: Endoscopy;;  esophagus   CHOLECYSTECTOMY     COLONOSCOPY  02/06/2006   Dr. Shaaron- single anal papilla, o/w normal rectum, normal colon   COLONOSCOPY N/A 09/13/2016   RMR: three 5-6 mm for adenomas removed.  Recommend one last surveillance colonoscopy in October 2020.   COLONOSCOPY WITH PROPOFOL  N/A 01/28/2020   Procedure: COLONOSCOPY WITH PROPOFOL ;  Surgeon: Shaaron Lamar HERO, MD;  Location: [facility];  Service: Endoscopy;  Laterality: N/A;  10:45am   CORONARY PRESSURE/FFR STUDY N/A 07/21/2019   Procedure: INTRAVASCULAR PRESSURE WIRE/FFR STUDY;  Surgeon: Claudene Victory ORN, MD;  Location: [facility];  Service: Cardiovascular;  Laterality: N/A;   CYSTOSCOPY WITH RETROGRADE PYELOGRAM, URETEROSCOPY AND STENT PLACEMENT Right 08/18/2020   Procedure: CYSTOSCOPY  WITH INCISION OF RIGHT URETEROCELE, URETEROSCOPY WITH HOLMIUM LASER, CYSTOLITHOLAPAXY OF 10 MM BLADDER STONE;  Surgeon: Watt Rush, MD;  Location: [facility];  Service: Urology;  Laterality: Right;   ESOPHAGOGASTRODUODENOSCOPY  09/15/2010   Dr. Shaaron- Salmon-colored epithelium coming up to 37 cm from the    ESOPHAGOGASTRODUODENOSCOPY N/A 07/02/2013   Dr. Shaaron- Barretts on bx. hiatal hernia   ESOPHAGOGASTRODUODENOSCOPY N/A 09/13/2016   RMR: Barrett's esophagus without dysplasia. one last EGD in 3 years.    ESOPHAGOGASTRODUODENOSCOPY (EGD) WITH PROPOFOL  N/A 01/28/2020   Procedure: ESOPHAGOGASTRODUODENOSCOPY (EGD) WITH PROPOFOL ;  Surgeon: Shaaron Lamar HERO, MD;  [facility]: [facility];  Service: Endoscopy;  Laterality: N/A;   LEFT HEART CATH AND CORONARY ANGIOGRAPHY N/A 07/21/2019   Procedure: LEFT HEART CATH AND CORONARY ANGIOGRAPHY;  Surgeon: Claudene Victory ORN, MD;  Location: [facility];  Service: Cardiovascular;  Laterality: N/A;   POLYPECTOMY  01/28/2020   Procedure: POLYPECTOMY;  Surgeon: Shaaron Lamar HERO, MD;  Location: [facility];  Service: Endoscopy;;  colon   ROTATOR CUFF REPAIR     YAG LASER APPLICATION Right 12/20/2014   Procedure: YAG LASER APPLICATION;  Surgeon: Dow JULIANNA Burke, MD;  Location: [facility];  Service: Ophthalmology;  Laterality: Right;    Home Medications:  Allergies as of 05/14/2024       Reactions   Prednisone    Patient state that this  medication made his blood sugar go real high        Medication List        Accurate as of May 14, 2024 11:59 PM. If you have any questions, ask your nurse or doctor.          albuterol  108 (90 Base) MCG/ACT inhaler Commonly known as: VENTOLIN  HFA Inhale 1-2 puffs into the lungs every 6 (six) hours as needed for shortness of breath or wheezing.   allopurinol 100 MG tablet Commonly known as: ZYLOPRIM Take 100 mg by mouth daily.   ALPRAZolam  1 MG tablet Commonly known as: XANAX  Take 1 mg by mouth at bedtime as needed  for anxiety or sleep.   ascorbic acid 500 MG tablet Commonly known as: VITAMIN C Take 500 mg by mouth daily.   Dexcom G7 Sensor Misc by Does not apply route.   DULoxetine 60 MG capsule Commonly known as: CYMBALTA Take 60 mg by mouth daily.   fexofenadine 180 MG tablet Commonly known as: ALLEGRA Take 180 mg by mouth daily.   finasteride  5 MG tablet Commonly known as: PROSCAR  Take 1 tablet (5 mg total) by mouth daily. What changed: See the new instructions. Changed by: Norleen Seltzer   gabapentin 300 MG capsule Commonly known as: NEURONTIN Take 600 mg by mouth at bedtime.   glipiZIDE  5 MG 24 hr tablet Commonly known as: GLUCOTROL  XL Take 1 tablet (5 mg total) by mouth daily with breakfast.   metFORMIN  500 MG 24 hr tablet Commonly known as: GLUCOPHAGE -XR Take 2 tablets (1,000 mg total) by mouth daily with breakfast.   NONFORMULARY OR COMPOUNDED ITEM Superquadmix with Prostin  (prostaglandin) 40 mcg/ml. Papaverine 30mg /ml, Regitine  4mg  and Atropine400mcg/ml.  Dispense 5 ml total. Inject 1 ml into penis prior to sex as directed, no more than once every other day. Alternate injection sites (right versus left). Started by: Lanett Lasorsa   pantoprazole  40 MG tablet Commonly known as: PROTONIX  TAKE ONE TABLET (40MG  TOTAL) BY MOUTH TWO TIMES DAILY BEFORE A MEAL What changed: See the new instructions.   pioglitazone  30 MG tablet Commonly known as: ACTOS  Take 1 tablet (30 mg total) by mouth daily.   rosuvastatin  10 MG tablet Commonly known as: CRESTOR  TAKE ONE TABLET (10MG  TOTAL) BY MOUTH DAILY   tamsulosin  0.4 MG Caps capsule Commonly known as: FLOMAX  Take 2 capsules (0.8 mg total) by mouth daily. What changed: See the new instructions. Changed by: Zoa Dowty   traMADol  50 MG tablet Commonly known as: ULTRAM  Take 1 tablet (50 mg total) by mouth every 6 (six) hours as needed for moderate pain (pain.).   Vitamin D3 50 MCG (2000 UT) Tabs Take 2,000 Units by mouth daily.    warfarin 5 MG tablet Commonly known as: COUMADIN  Take as directed by the anticoagulation clinic. If you are unsure how to take this medication, talk to your nurse or doctor. Original instructions: TAKE ONE TABLET BY MOUTH DAILY EXCEPT TAKE ONE AND ONE-HALF TABLETS ON TUESDAYS AND FRIDAYS OR AS DIRECTED What changed: See the new instructions.        Allergies:  Allergies  Allergen Reactions   Prednisone     Patient state that this medication made his blood sugar go real high    Family History  Problem Relation Age of Onset   Prostate cancer Father        deceased   Stroke Mother        deceased   Colon cancer Neg Hx  Social History:  reports that he quit smoking about 30 years ago. His smoking use included cigarettes. He started smoking about 60 years ago. He has a 45 pack-year smoking history. He has never used smokeless tobacco. He reports that he does not drink alcohol and does not use drugs.  ROS: Review of Systems  Constitutional:  Positive for malaise/fatigue.  Gastrointestinal:  Positive for heartburn.  Musculoskeletal:  Positive for joint pain.  Skin:  Positive for rash.  Endo/Heme/Allergies:  Bruises/bleeds easily.  Psychiatric/Behavioral:  The patient is nervous/anxious.      Physical Exam:  BP 123/73   Pulse 92   Gen: WD, WN in NAD.   Laboratory Data:   UA reviewed.  Results for orders placed or performed in visit on 05/14/24 (from the past 24 hours)  Urinalysis, Routine w reflex microscopic     Status: Abnormal   Collection Time: 05/14/24  3:30 PM  Result Value Ref Range   Specific Gravity, UA 1.025 1.005 - 1.030   pH, UA 6.0 5.0 - 7.5   Color, UA Yellow Yellow   Appearance Ur Clear Clear   Leukocytes,UA Negative Negative   Protein,UA Negative Negative/Trace   Glucose, UA Trace (A) Negative   Ketones, UA Negative Negative   RBC, UA Trace (A) Negative   Bilirubin, UA Negative Negative   Urobilinogen, Ur 0.2 0.2 - 1.0 mg/dL   Nitrite, UA  Negative Negative   Microscopic Examination See below:    Narrative   Performed at:  [address] - [facility] [address], [place], [place]  [id number] Lab Director: Cherlyn Ore MT, Phone:  [phone]  Microscopic Examination     Status: None   Collection Time: 05/14/24  3:30 PM   Urine  Result Value Ref Range   WBC, UA None seen 0 - 5 /hpf   RBC, Urine 0-2 0 - 2 /hpf   Epithelial Cells (non renal) 0-10 0 - 10 /hpf   Bacteria, UA None seen None seen/Few   Narrative   Performed at:  [address] - [facility] [address] [place], [place]  [id number] Lab Director: Cherlyn Ore MT, Phone:  [phone]   Flowrate reviewed.    Impression/Assessment:  ED.  He has failed quadmix so I will send the super Quadmix.   BPH with BOO stable nocturia and hesitancy on current therapy.  He is currently on finasteride  and tamsulosin .  He has trilobar hyperplasia with obstruction on cystoscopy.  Meds refilled.    Elevated PSA.  PSA today.    Hx of horseshoe kidney with stones.   CT on 12/26/20 was ok.   He has no symptoms and his UA is clear.   KUB will be ordered since he has some left flank pain.        Orders Placed This Encounter  Procedures   Microscopic Examination   Abdomen 1 view (KUB)    Standing Status:   Future    Expected Date:   05/15/2024    Expiration Date:   05/14/2025    Reason for Exam (SYMPTOM  OR DIAGNOSIS REQUIRED):   flank pain and prior stones    Preferred imaging location?:   [facility]   Urinalysis, Routine w reflex microscopic   PSA, total and free    Standing Status:   Future    Expected Date:   05/07/2025    Expiration Date:   05/14/2025   Ambulatory referral to Urology    Referral Priority:   Routine  Referral Type:   Consultation    Referral Reason:   Patient Preference    Referred to Provider:   Watt Rush, MD    Requested Specialty:   Urology    Number of Visits Requested:   1   PR COMPLEX UROFLOMETRY   Meds ordered this  encounter  Medications   NONFORMULARY OR COMPOUNDED ITEM    Sig: Superquadmix with Prostin  (prostaglandin) 40 mcg/ml. Papaverine 30mg /ml, Regitine  4mg  and Atropine400mcg/ml.  Dispense 5 ml total. Inject 1 ml into penis prior to sex as directed, no more than once every other day. Alternate injection sites (right versus left).    Dispense:  1 each    Refill:  3    Patient needs a call with the price before filling.   finasteride  (PROSCAR ) 5 MG tablet    Sig: Take 1 tablet (5 mg total) by mouth daily.    Dispense:  90 tablet    Refill:  3   tamsulosin  (FLOMAX ) 0.4 MG CAPS capsule    Sig: Take 2 capsules (0.8 mg total) by mouth daily.    Dispense:  180 capsule    Refill:  3   Return in about 1 year (around 05/14/2025) for 1 year in [place]. .   CC: Dr. Jerilynn Carnes.  Patient ID: Vaughan JONELLE Rigg, male   DOB: 12/09/1940, 83 y.o.   MRN: [id number] Patient ID: ELZIA HOTT, male   DOB: January 02, 1941, 83 y.o.   MRN: [id number]

## 2024-05-14 NOTE — Progress Notes (Signed)
 Uroflowmetry order received. Patient voided own on with the following results:   Uroflow  Peak Flow: 3ml Average Flow: 2ml Voided Volume: 45ml Voiding Time: 19sec Flow Time: 15sec Time to Peak Flow: 1sec   Urine sent for ua

## 2024-05-15 ENCOUNTER — Other Ambulatory Visit: Payer: Self-pay

## 2024-05-15 ENCOUNTER — Ambulatory Visit (HOSPITAL_COMMUNITY)
Admission: RE | Disposition: A | Discharge status: Home / Self Care | Source: Home / Self Care | Attending: Cardiovascular Disease

## 2024-05-15 ENCOUNTER — Ambulatory Visit (HOSPITAL_COMMUNITY)
Admission: RE | Admit: 2024-05-15 | Discharge: 2024-05-15 | Disposition: A | Discharge status: Home / Self Care | Attending: Cardiovascular Disease | Admitting: Cardiovascular Disease

## 2024-05-15 DIAGNOSIS — Z79899 Other long term (current) drug therapy: Secondary | ICD-10-CM | POA: Diagnosis not present

## 2024-05-15 DIAGNOSIS — E1140 Type 2 diabetes mellitus with diabetic neuropathy, unspecified: Secondary | ICD-10-CM | POA: Diagnosis not present

## 2024-05-15 DIAGNOSIS — I2510 Atherosclerotic heart disease of native coronary artery without angina pectoris: Secondary | ICD-10-CM

## 2024-05-15 DIAGNOSIS — Z87891 Personal history of nicotine dependence: Secondary | ICD-10-CM | POA: Diagnosis not present

## 2024-05-15 DIAGNOSIS — I509 Heart failure, unspecified: Secondary | ICD-10-CM | POA: Insufficient documentation

## 2024-05-15 DIAGNOSIS — I110 Hypertensive heart disease with heart failure: Secondary | ICD-10-CM | POA: Diagnosis not present

## 2024-05-15 DIAGNOSIS — Z7982 Long term (current) use of aspirin: Secondary | ICD-10-CM | POA: Insufficient documentation

## 2024-05-15 DIAGNOSIS — I4821 Permanent atrial fibrillation: Secondary | ICD-10-CM | POA: Diagnosis not present

## 2024-05-15 DIAGNOSIS — R931 Abnormal findings on diagnostic imaging of heart and coronary circulation: Secondary | ICD-10-CM | POA: Diagnosis not present

## 2024-05-15 DIAGNOSIS — R5383 Other fatigue: Secondary | ICD-10-CM | POA: Insufficient documentation

## 2024-05-15 DIAGNOSIS — I2089 Other forms of angina pectoris: Secondary | ICD-10-CM | POA: Diagnosis present

## 2024-05-15 DIAGNOSIS — I6523 Occlusion and stenosis of bilateral carotid arteries: Secondary | ICD-10-CM | POA: Insufficient documentation

## 2024-05-15 DIAGNOSIS — I25118 Atherosclerotic heart disease of native coronary artery with other forms of angina pectoris: Secondary | ICD-10-CM | POA: Insufficient documentation

## 2024-05-15 HISTORY — PX: RIGHT/LEFT HEART CATH AND CORONARY ANGIOGRAPHY: CATH118266

## 2024-05-15 LAB — POCT I-STAT EG7
Acid-Base Excess: 0 mmol/L (ref 0.0–2.0)
Acid-Base Excess: 0 mmol/L (ref 0.0–2.0)
Bicarbonate: 25.4 mmol/L (ref 20.0–28.0)
Bicarbonate: 25.6 mmol/L (ref 20.0–28.0)
Calcium, Ion: 1.23 mmol/L (ref 1.15–1.40)
Calcium, Ion: 1.25 mmol/L (ref 1.15–1.40)
HCT: 35 % — ABNORMAL LOW (ref 39.0–52.0)
HCT: 35 % — ABNORMAL LOW (ref 39.0–52.0)
Hemoglobin: 11.9 g/dL — ABNORMAL LOW (ref 13.0–17.0)
Hemoglobin: 11.9 g/dL — ABNORMAL LOW (ref 13.0–17.0)
O2 Saturation: 68 %
O2 Saturation: 72 %
Potassium: 3.4 mmol/L — ABNORMAL LOW (ref 3.5–5.1)
Potassium: 3.4 mmol/L — ABNORMAL LOW (ref 3.5–5.1)
Sodium: 141 mmol/L (ref 135–145)
Sodium: 142 mmol/L (ref 135–145)
TCO2: 27 mmol/L (ref 22–32)
TCO2: 27 mmol/L (ref 22–32)
pCO2, Ven: 44.9 mmHg (ref 44–60)
pCO2, Ven: 45.5 mmHg (ref 44–60)
pH, Ven: 7.355 (ref 7.25–7.43)
pH, Ven: 7.363 (ref 7.25–7.43)
pO2, Ven: 37 mmHg (ref 32–45)
pO2, Ven: 39 mmHg (ref 32–45)

## 2024-05-15 LAB — POCT I-STAT 7, (LYTES, BLD GAS, ICA,H+H)
Acid-Base Excess: 0 mmol/L (ref 0.0–2.0)
Bicarbonate: 25 mmol/L (ref 20.0–28.0)
Calcium, Ion: 1.24 mmol/L (ref 1.15–1.40)
HCT: 35 % — ABNORMAL LOW (ref 39.0–52.0)
Hemoglobin: 11.9 g/dL — ABNORMAL LOW (ref 13.0–17.0)
O2 Saturation: 92 %
Potassium: 3.4 mmol/L — ABNORMAL LOW (ref 3.5–5.1)
Sodium: 141 mmol/L (ref 135–145)
TCO2: 26 mmol/L (ref 22–32)
pCO2 arterial: 43.6 mmHg (ref 32–48)
pH, Arterial: 7.367 (ref 7.35–7.45)
pO2, Arterial: 65 mmHg — ABNORMAL LOW (ref 83–108)

## 2024-05-15 LAB — URINALYSIS, ROUTINE W REFLEX MICROSCOPIC
Bilirubin, UA: NEGATIVE
Ketones, UA: NEGATIVE
Leukocytes,UA: NEGATIVE
Nitrite, UA: NEGATIVE
Protein,UA: NEGATIVE
Specific Gravity, UA: 1.025 (ref 1.005–1.030)
Urobilinogen, Ur: 0.2 mg/dL (ref 0.2–1.0)
pH, UA: 6 (ref 5.0–7.5)

## 2024-05-15 LAB — MICROSCOPIC EXAMINATION
Bacteria, UA: NONE SEEN
WBC, UA: NONE SEEN /HPF (ref 0–5)

## 2024-05-15 LAB — POCT ACTIVATED CLOTTING TIME: Activated Clotting Time: 435 s

## 2024-05-15 LAB — GLUCOSE, CAPILLARY
Glucose-Capillary: 152 mg/dL — ABNORMAL HIGH (ref 70–99)
Glucose-Capillary: 166 mg/dL — ABNORMAL HIGH (ref 70–99)

## 2024-05-15 LAB — PROTIME-INR
INR: 1.1 (ref 0.8–1.2)
Prothrombin Time: 15 s (ref 11.4–15.2)

## 2024-05-15 SURGERY — RIGHT/LEFT HEART CATH AND CORONARY ANGIOGRAPHY
Anesthesia: LOCAL

## 2024-05-15 MED ORDER — HEPARIN (PORCINE) IN NACL 1000-0.9 UT/500ML-% IV SOLN
INTRAVENOUS | Status: DC | PRN
Start: 2024-05-15 — End: 2024-05-15
  Administered 2024-05-15 (×3): 500 mL

## 2024-05-15 MED ORDER — LIDOCAINE HCL (PF) 1 % IJ SOLN
INTRAMUSCULAR | Status: AC
  Filled 2024-05-15: qty 30

## 2024-05-15 MED ORDER — SODIUM CHLORIDE 0.9 % WEIGHT BASED INFUSION: 1.0000 mL/kg/h | INTRAVENOUS | Status: DC

## 2024-05-15 MED ORDER — HEPARIN SODIUM (PORCINE) 1000 UNIT/ML IJ SOLN
INTRAMUSCULAR | Status: DC | PRN
  Administered 2024-05-15: 4000 [IU] via INTRAVENOUS
  Administered 2024-05-15: 5000 [IU] via INTRAVENOUS

## 2024-05-15 MED ORDER — SODIUM CHLORIDE 0.9% FLUSH: 3.0000 mL | INTRAVENOUS | Status: DC | PRN

## 2024-05-15 MED ORDER — SODIUM CHLORIDE 0.9 % WEIGHT BASED INFUSION: 3.0000 mL/kg/h | INTRAVENOUS | Status: AC

## 2024-05-15 MED ORDER — HEPARIN SODIUM (PORCINE) 1000 UNIT/ML IJ SOLN
INTRAMUSCULAR | Status: AC
Start: 2024-05-15 — End: 2024-05-15
  Filled 2024-05-15: qty 10

## 2024-05-15 MED ORDER — MIDAZOLAM HCL 2 MG/2ML IJ SOLN
INTRAMUSCULAR | Status: AC
Start: 2024-05-15 — End: 2024-05-15
  Filled 2024-05-15: qty 2

## 2024-05-15 MED ORDER — LABETALOL HCL 5 MG/ML IV SOLN: 10.0000 mg | INTRAVENOUS | Status: DC | PRN

## 2024-05-15 MED ORDER — ONDANSETRON HCL 4 MG/2ML IJ SOLN: 4.0000 mg | Freq: Four times a day (QID) | INTRAMUSCULAR | Status: DC | PRN

## 2024-05-15 MED ORDER — VERAPAMIL HCL 2.5 MG/ML IV SOLN
INTRAVENOUS | Status: AC
  Filled 2024-05-15: qty 2

## 2024-05-15 MED ORDER — SODIUM CHLORIDE 0.9 % IV SOLN: 250.0000 mL | INTRAVENOUS | Status: DC | PRN

## 2024-05-15 MED ORDER — SODIUM CHLORIDE 0.9% FLUSH: 3.0000 mL | Freq: Two times a day (BID) | INTRAVENOUS | Status: DC

## 2024-05-15 MED ORDER — FENTANYL CITRATE (PF) 100 MCG/2ML IJ SOLN
INTRAMUSCULAR | Status: AC
  Filled 2024-05-15: qty 2

## 2024-05-15 MED ORDER — FENTANYL CITRATE (PF) 100 MCG/2ML IJ SOLN
INTRAMUSCULAR | Status: DC | PRN
  Administered 2024-05-15: 25 ug via INTRAVENOUS

## 2024-05-15 MED ORDER — ACETAMINOPHEN 325 MG PO TABS: 650.0000 mg | ORAL_TABLET | ORAL | Status: DC | PRN

## 2024-05-15 MED ORDER — IOHEXOL 350 MG/ML SOLN
INTRAVENOUS | Status: DC | PRN
  Administered 2024-05-15: 90 mL

## 2024-05-15 MED ORDER — SODIUM CHLORIDE 0.9 % IV SOLN: INTRAVENOUS | Status: DC

## 2024-05-15 MED ORDER — LIDOCAINE HCL (PF) 1 % IJ SOLN
INTRAMUSCULAR | Status: DC | PRN
  Administered 2024-05-15 (×2): 2 mL

## 2024-05-15 MED ORDER — MIDAZOLAM HCL 2 MG/2ML IJ SOLN
INTRAMUSCULAR | Status: DC | PRN
  Administered 2024-05-15: 1 mg via INTRAVENOUS

## 2024-05-15 MED ORDER — HYDRALAZINE HCL 20 MG/ML IJ SOLN: 10.0000 mg | INTRAMUSCULAR | Status: DC | PRN

## 2024-05-15 MED ORDER — ASPIRIN 81 MG PO CHEW: 81.0000 mg | CHEWABLE_TABLET | Freq: Once | ORAL | Status: DC

## 2024-05-15 SURGICAL SUPPLY — 12 items
CATH BALLN WEDGE 5F 110CM (CATHETERS) IMPLANT
CATH INFINITI AMBI 5FR TG (CATHETERS) IMPLANT
CATH VISTA GUIDE 6FR XB3.5 EPK (CATHETERS) IMPLANT
DEVICE RAD COMP TR BAND LRG (VASCULAR PRODUCTS) IMPLANT
GLIDESHEATH SLEND SS 6F .021 (SHEATH) IMPLANT
GUIDEWIRE INQWIRE 1.5J.035X260 (WIRE) IMPLANT
GUIDEWIRE PRESSURE X 175 (WIRE) IMPLANT
KIT ESSENTIALS PG (KITS) IMPLANT
PACK CARDIAC CATHETERIZATION (CUSTOM PROCEDURE TRAY) ×1 IMPLANT
SET ATX-X65L (MISCELLANEOUS) IMPLANT
SHEATH GLIDE SLENDER 4/5FR (SHEATH) IMPLANT
SHEATH PROBE COVER 6X72 (BAG) IMPLANT

## 2024-05-15 NOTE — Interval H&P Note (Signed)
 History and Physical Interval Note:  05/15/2024 8:31 AM  [name]  has presented today for surgery, with the diagnosis of Abnormal CT.    Coronary CTA suggests Severe LM Stenosis    The various methods of treatment have been discussed with the patient and family. After consideration of risks, benefits and other options for treatment, the patient has consented to  Procedure(s): RIGHT/LEFT HEART CATH AND CORONARY ANGIOGRAPHY (N/A) ABNORMAL CARDIAC CT ANGIOGRAPHY   as a surgical intervention.  The patient's history has been reviewed, patient examined, no change in status, stable for surgery.  I have reviewed the patient's chart and labs.  Questions were answered to the patient's satisfaction.    Cath Lab Visit (complete for each Cath Lab visit)  Clinical Evaluation Leading to the Procedure:   ACS: No.  Non-ACS:    Anginal Classification: CCS II  Anti-ischemic medical therapy: Minimal Therapy (1 class of medications)  Non-Invasive Test Results: High-risk stress test findings: cardiac mortality >3%/year  Prior CABG: No previous CABG    Alm [place]

## 2024-05-17 ENCOUNTER — Encounter (HOSPITAL_COMMUNITY): Payer: Self-pay | Admitting: Cardiology

## 2024-05-18 MED FILL — Verapamil HCl IV Soln 2.5 MG/ML: INTRAVENOUS | Qty: 2 | Status: AC

## 2024-05-21 ENCOUNTER — Telehealth: Payer: Self-pay | Admitting: Neurology

## 2024-05-21 NOTE — Telephone Encounter (Signed)
 Sent mychart msg and updated appt reminder in mail informing pt of appt change - MD schedule change

## 2024-05-25 ENCOUNTER — Ambulatory Visit: Attending: Cardiology | Admitting: *Deleted

## 2024-05-25 DIAGNOSIS — I4891 Unspecified atrial fibrillation: Secondary | ICD-10-CM | POA: Diagnosis not present

## 2024-05-25 DIAGNOSIS — Z5181 Encounter for therapeutic drug level monitoring: Secondary | ICD-10-CM | POA: Insufficient documentation

## 2024-05-25 LAB — POCT INR: INR: 2.5 (ref 2.0–3.0)

## 2024-05-25 NOTE — Patient Instructions (Signed)
Continue warfarin 1 tablet daily Recheck in 4 wks 

## 2024-05-25 NOTE — Progress Notes (Signed)
Please see anticoagulation encounter.

## 2024-05-29 ENCOUNTER — Ambulatory Visit: Admitting: Cardiology

## 2024-06-09 ENCOUNTER — Ambulatory Visit: Attending: Student | Admitting: Student

## 2024-06-09 ENCOUNTER — Encounter: Payer: Self-pay | Admitting: Student

## 2024-06-09 VITALS — BP 130/62 | HR 62 | Ht 71.0 in | Wt 221.4 lb

## 2024-06-09 DIAGNOSIS — I480 Paroxysmal atrial fibrillation: Secondary | ICD-10-CM | POA: Insufficient documentation

## 2024-06-09 DIAGNOSIS — R0609 Other forms of dyspnea: Secondary | ICD-10-CM | POA: Diagnosis not present

## 2024-06-09 DIAGNOSIS — E785 Hyperlipidemia, unspecified: Secondary | ICD-10-CM | POA: Insufficient documentation

## 2024-06-09 DIAGNOSIS — E119 Type 2 diabetes mellitus without complications: Secondary | ICD-10-CM | POA: Insufficient documentation

## 2024-06-09 DIAGNOSIS — I10 Essential (primary) hypertension: Secondary | ICD-10-CM | POA: Diagnosis not present

## 2024-06-09 DIAGNOSIS — I6523 Occlusion and stenosis of bilateral carotid arteries: Secondary | ICD-10-CM | POA: Diagnosis not present

## 2024-06-09 DIAGNOSIS — I2510 Atherosclerotic heart disease of native coronary artery without angina pectoris: Secondary | ICD-10-CM | POA: Insufficient documentation

## 2024-06-09 MED ORDER — FUROSEMIDE 20 MG PO TABS: 20.0000 mg | ORAL_TABLET | Freq: Every day | ORAL | 11 refills | Status: AC

## 2024-06-09 NOTE — Patient Instructions (Signed)
 Medication Instructions:  Your physician has recommended you make the following change in your medication:   Start Lasix  20 mg Daily    *If you need a refill on your cardiac medications before your next appointment, please call your pharmacy*  Lab Work: Your physician recommends that you return for lab work in: 2 Weeks ( BNP, BMP)   If you have labs (blood work) drawn today and your tests are completely normal, you will receive your results only by: MyChart Message (if you have MyChart) OR A paper copy in the mail If you have any lab test that is abnormal or we need to change your treatment, we will call you to review the results.  Testing/Procedures: NONE    Follow-Up: At [facility], you and your health needs are our priority.  As part of our continuing mission to provide you with exceptional heart care, our providers are all part of one team.  This team includes your primary Cardiologist (physician) and Advanced Practice Providers or APPs (Physician Assistants and Nurse Practitioners) who all work together to provide you with the care you need, when you need it.  Your next appointment:    September   Provider:   You may see Jayson Sierras, MD or one of the following Advanced Practice Providers on your designated Care Team:   Laymon Qua, PA-C  [place], [place] Olivia [place], [place]     We recommend signing up for the patient portal called MyChart.  Sign up information is provided on this After Visit Summary.  MyChart is used to connect with patients for Virtual Visits (Telemedicine).  Patients are able to view lab/test results, encounter notes, upcoming appointments, etc.  Non-urgent messages can be sent to your provider as well.   To learn more about what you can do with MyChart, go to [email or web address].   Other Instructions Thank you for choosing [facility]!

## 2024-06-09 NOTE — Progress Notes (Signed)
 Cardiology Office Note    Date:  06/09/2024  ID:  [name], DOB 05-11-1941, MRN [id number] Cardiologist: Jayson Sierras, MD { : History of Present Illness:    [name] is a 83 y.o. male with past medical history of nonobstructive CAD (by cardiac catheterization in 2020), carotid artery stenosis, paroxysmal atrial fibrillation, HTN, HLD and Type II DM who presents to the office today for follow-up of his recent cardiac catheterization.  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 CT FFR demonstrated a probable hemodynamically significant flow-limiting lesion along the LM and cardiac catheterization had been recommended for definitive evaluation. This was performed by Dr. Anner on 05/15/2024 and showed only 5% stenosis along the LM and 45% stenosis along the proximal-LAD. Was also noted to have 20% stenosis along the ostial RCA and 40% along the proximal RCA. Hemodynamic findings were consistent with mild pulmonary hypertension and mildly elevated LVEDP suggestive of pulmonary venous congestion. It was recommended to consider baseline diuretic therapy and would recommend stopping Pioglitazone .  In talking with the patient today, he reports progressive fatigue and shortness of breath over the past 6+ months. He is thankful that his cardiac catheterization was reassuring but is frustrated that he still does not have an answer to his symptoms. We reviewed indications for a trial of diuretic therapy as discussed above. He reports having shortness of breath with activity but says he is not active at baseline now due to worsening neuropathy along his legs. Reports having intermittent discomfort along his legs at rest or with activity and is scheduled for a nerve conduction  study in 06/2024. He questions if his fatigue is possibly due to changes in his sleep habits as he does not go to bed until midnight at times but then will sleep until 1000. Thyroid  levels were checked by his PCP in 02/2024 and WNL. Previously not interested in a sleep study. He denies any exertional chest pain, orthopnea or PND. Does experience intermittent lower extremity edema.   Studies Reviewed:   EKG: EKG is not ordered today.  Cardiac Catheterization: 04/2024   Ost LM to Mid LM lesion is 5% stenosed-more of the bend than a lesion.   Prox LAD lesion is 45% stenosed.  RFR 0.96   Ost RCA to Prox RCA lesion is 20% stenosed. Prox RCA lesion is 40% stenosed.   Hemodynamic findings consistent with mild pulmonary hypertension with mildly elevated LVEDP suggestive of pulmonary venous congestion.   There is no aortic valve stenosis.   Dominance: Right  FINDINGS Angiographically stable findings when compared to previous catheterization with RFR negative LM, LAD findings 0.96 with LCx 1.0. This indicates no flow-limiting lesion in either the left main or LAD that was anticipated based on Coronary CTA. Very tortuous proximal bend in the RCA with stable focal mild stenosis.\ Right Heart Cath pressures show mildly elevated pressures throughout including RA, RVEDP, PCWP and LVEDP ranging from 17 to 20 mmHg. No evidence of potential etiology noted on Echo.-Suggest possible diastolic dysfunction related to A-fib    RECOMMENDATION   In the absence of any other complications or medical issues, we expect the patient to be ready for discharge from a cath perspective on 05/15/2024.   Hold metformin  for 48 hours Consider baseline diuretic and would  strongly consider stopping pioglitazone    Recommend to resume Warfarin, at currently prescribed dose and frequency on 05/15/2024.   Concurrent antiplatelet therapy not recommended.     Risk Assessment/Calculations:    CHA2DS2-VASc Score = 5  This indicates a  7.2% annual risk of stroke. The patient's score is based upon: CHF History: 0 HTN History: 1 Diabetes History: 1 Stroke History: 0 Vascular Disease History: 1 Age Score: 2 Gender Score: 0    Physical Exam:   VS:  BP 130/62 (BP Location: Right Arm, Cuff Size: Normal)   Pulse 62   Ht 5' 11 (1.803 m)   Wt 221 lb 6.4 oz (100.4 kg)   SpO2 94%   BMI 30.88 kg/m    Wt Readings from Last 3 Encounters:  06/09/24 221 lb 6.4 oz (100.4 kg)  05/15/24 214 lb (97.1 kg)  05/06/24 225 lb (102.1 kg)     GEN: Well nourished, well developed male appearing in no acute distress NECK: No JVD; No carotid bruits CARDIAC: RRR, no murmurs, rubs, gallops RESPIRATORY:  Clear to auscultation without rales, wheezing or rhonchi  ABDOMEN: Appears non-distended. No obvious abdominal masses. EXTREMITIES: No clubbing or cyanosis. No pitting edema.  Distal pedal pulses are 2+ bilaterally.   Assessment and Plan:   1. Coronary artery disease involving native coronary artery of native heart without angina pectoris/Dyspnea on Exertion - Recent cardiac catheterization showed nonobstructive CAD as outlined above. He did have mildly elevated filling pressures throughout and was recommended to try a diuretic  Reviewed with the patient today and he is hesitant to be on additional medical therapy but is open to a trial of Lasix  to see if this does help with his symptoms. Will prescribe Lasix  20 mg daily. Will check a BNP and BMET in 2 weeks. If his shortness of breath does not improve with diuretic therapy, would recommend referral to Pulmonology for further evaluation. - In regards to his CAD, will continue Crestor  10 mg daily. He is not on ASA given the need for anticoagulation.  2. PAF (paroxysmal atrial fibrillation) (HCC) - He denies any recent palpitations and is not currently on AV nodal blocking agents. Remains on Coumadin  for anticoagulation and INR was at 2.5 when checked on 05/25/2024.  3. Bilateral carotid  artery stenosis - Prior carotid dopplers in 08/2019 showed less than 50% stenosis bilaterally. Not on ASA given the need for Coumadin . Continue Crestor  10 mg daily.  4. History of HTN - Blood pressure is well-controlled at 130/62 during today's visit. He is not currently on antihypertensive therapy.  5. Hyperlipidemia LDL goal <70 - LDL was at 42 when checked in 02/2024. Continue current medical therapy with Crestor  10 mg daily.  6. Type 2 diabetes mellitus without complication, without long-term current use of insulin  (HCC) - Followed by Endocrinology. Hgb A1c was at 6.9 when checked in 01/2024. He has been on Actos  and was recommended based off cardiac catheterization results to discontinue this. I have sent a note to Endocrinology today so this can be addressed at his upcoming visit later this month.  Signed, [name] CHRISTELLA Qua, PA-C

## 2024-06-12 ENCOUNTER — Other Ambulatory Visit: Payer: Self-pay | Admitting: Gastroenterology

## 2024-06-16 DIAGNOSIS — E1165 Type 2 diabetes mellitus with hyperglycemia: Secondary | ICD-10-CM | POA: Diagnosis not present

## 2024-06-16 DIAGNOSIS — E559 Vitamin D deficiency, unspecified: Secondary | ICD-10-CM | POA: Diagnosis not present

## 2024-06-17 ENCOUNTER — Ambulatory Visit (INDEPENDENT_AMBULATORY_CARE_PROVIDER_SITE_OTHER): Admitting: Nurse Practitioner

## 2024-06-17 ENCOUNTER — Encounter: Payer: Self-pay | Admitting: Nurse Practitioner

## 2024-06-17 VITALS — BP 114/68 | HR 71 | Ht 71.0 in | Wt 221.0 lb

## 2024-06-17 DIAGNOSIS — Z7984 Long term (current) use of oral hypoglycemic drugs: Secondary | ICD-10-CM

## 2024-06-17 DIAGNOSIS — E1165 Type 2 diabetes mellitus with hyperglycemia: Secondary | ICD-10-CM | POA: Diagnosis not present

## 2024-06-17 DIAGNOSIS — G6289 Other specified polyneuropathies: Secondary | ICD-10-CM

## 2024-06-17 DIAGNOSIS — E559 Vitamin D deficiency, unspecified: Secondary | ICD-10-CM | POA: Diagnosis not present

## 2024-06-17 LAB — COMPREHENSIVE METABOLIC PANEL WITH GFR
ALT: 13 IU/L (ref 0–44)
AST: 17 IU/L (ref 0–40)
Albumin: 4.1 g/dL (ref 3.7–4.7)
Alkaline Phosphatase: 57 IU/L (ref 44–121)
BUN/Creatinine Ratio: 15 (ref 10–24)
BUN: 16 mg/dL (ref 8–27)
Bilirubin Total: 0.4 mg/dL (ref 0.0–1.2)
CO2: 23 mmol/L (ref 20–29)
Calcium: 9.1 mg/dL (ref 8.6–10.2)
Chloride: 104 mmol/L (ref 96–106)
Creatinine, Ser: 1.08 mg/dL (ref 0.76–1.27)
Globulin, Total: 1.7 g/dL (ref 1.5–4.5)
Glucose: 99 mg/dL (ref 70–99)
Potassium: 3.8 mmol/L (ref 3.5–5.2)
Sodium: 142 mmol/L (ref 134–144)
Total Protein: 5.8 g/dL — ABNORMAL LOW (ref 6.0–8.5)
eGFR: 69 mL/min/1.73 (ref >59)

## 2024-06-17 LAB — POCT GLYCOSYLATED HEMOGLOBIN (HGB A1C): Hemoglobin A1C: 7.2 % — AB (ref 4.0–5.6)

## 2024-06-17 LAB — TSH: TSH: 4.04 u[IU]/mL (ref 0.450–4.500)

## 2024-06-17 LAB — T4, FREE: Free T4: 1.12 ng/dL (ref 0.82–1.77)

## 2024-06-17 LAB — VITAMIN D 25 HYDROXY (VIT D DEFICIENCY, FRACTURES): Vit D, 25-Hydroxy: 46.8 ng/mL (ref 30.0–100.0)

## 2024-06-17 NOTE — Progress Notes (Signed)
 Endocrinology Follow Up Visit      06/17/2024, 4:01 PM      Subjective:    Patient ID: [name], male    DOB: 1941-11-02.  [name] is being seen in follow up after being seen in consultation for management of currently uncontrolled symptomatic diabetes requested by  Bertell Satterfield, MD.  He is a retired Emergency planning/management officer for [employer].   Past Medical History:  Diagnosis Date   Aortic atherosclerosis (HCC)    Barrett's esophagus    Short-segment, diagnosed in 2007 by Dr. Shaaron   BPH (benign prostatic hyperplasia)    CAD (coronary artery disease)    a. cath on 07/21/2019 showing patent LM and LCx with 40-60% Proximal LAD and 30-40% Pro-RCA stenosis with normal LV function.   Essential hypertension    GERD (gastroesophageal reflux disease)    Hiatal hernia    History of kidney stones    PAF (paroxysmal atrial fibrillation) (HCC)    S/P colonoscopy 2007   S/P endoscopy 08/2010    Salmon-colored epithelium coming up to 37 cm from the    Type 2 diabetes mellitus (HCC)    Vertigo     Past Surgical History:  Procedure Laterality Date   APPENDECTOMY     BIOPSY  01/28/2020   Procedure: BIOPSY;  Surgeon: Shaaron Lamar HERO, MD;  Location: [facility];  Service: Endoscopy;;  esophagus   CHOLECYSTECTOMY     COLONOSCOPY  02/06/2006   Dr. Shaaron- single anal papilla, o/w normal rectum, normal colon   COLONOSCOPY N/A 09/13/2016   RMR: three 5-6 mm for adenomas removed.  Recommend one last surveillance colonoscopy in October 2020.   COLONOSCOPY WITH PROPOFOL  N/A 01/28/2020   Procedure: COLONOSCOPY WITH PROPOFOL ;  Surgeon: Shaaron Lamar HERO, MD;  Location: [facility];  Service: Endoscopy;  Laterality: N/A;  10:45am   CORONARY PRESSURE/FFR STUDY N/A 07/21/2019   Procedure: INTRAVASCULAR PRESSURE WIRE/FFR STUDY;  Surgeon: Claudene Victory ORN, MD;  Location: [facility];  Service:  Cardiovascular;  Laterality: N/A;   CYSTOSCOPY WITH RETROGRADE PYELOGRAM, URETEROSCOPY AND STENT PLACEMENT Right 08/18/2020   Procedure: CYSTOSCOPY WITH INCISION OF RIGHT URETEROCELE, URETEROSCOPY WITH HOLMIUM LASER, CYSTOLITHOLAPAXY OF 10 MM BLADDER STONE;  Surgeon: Watt Rush, MD;  Location: [facility];  Service: Urology;  Laterality: Right;   ESOPHAGOGASTRODUODENOSCOPY  09/15/2010   Dr. Shaaron- Salmon-colored epithelium coming up to 37 cm from the    ESOPHAGOGASTRODUODENOSCOPY N/A 07/02/2013   Dr. Shaaron- Barretts on bx. hiatal hernia   ESOPHAGOGASTRODUODENOSCOPY N/A 09/13/2016   RMR: Barrett's esophagus without dysplasia. one last EGD in 3 years.    ESOPHAGOGASTRODUODENOSCOPY (EGD) WITH PROPOFOL  N/A 01/28/2020   Procedure: ESOPHAGOGASTRODUODENOSCOPY (EGD) WITH PROPOFOL ;  Surgeon: Shaaron Lamar HERO, MD;  Location: [facility];  Service: Endoscopy;  Laterality: N/A;   LEFT HEART CATH AND CORONARY ANGIOGRAPHY N/A 07/21/2019   Procedure: LEFT HEART CATH AND CORONARY ANGIOGRAPHY;  Surgeon: Claudene Victory ORN, MD;  Location: [facility];  Service: Cardiovascular;  Laterality: N/A;   POLYPECTOMY  01/28/2020   Procedure: POLYPECTOMY;  Surgeon: Shaaron Lamar HERO, MD;  Location: [facility];  Service: Endoscopy;;  colon   RIGHT/LEFT HEART CATH AND CORONARY ANGIOGRAPHY N/A 05/15/2024   Procedure: RIGHT/LEFT HEART CATH AND CORONARY ANGIOGRAPHY;  Surgeon: Anner Alm ORN, MD;  Location: [facility];  Service: Cardiovascular;  Laterality: N/A;   ROTATOR CUFF REPAIR     YAG LASER APPLICATION Right 12/20/2014   Procedure: YAG LASER APPLICATION;  Surgeon: Dow JULIANNA Burke, MD;  Location: [facility];  Service: Ophthalmology;  Laterality: Right;    Social History   Socioeconomic History   Marital status: Married    Spouse name: Not on file   Number of children: Not on file   Years of education: Not on file   Highest education level: Not on file  Occupational History   Not on file  Tobacco Use   Smoking  status: Former    Current packs/day: 0.00    Average packs/day: 1.5 packs/day for 30.0 years (45.0 ttl pk-yrs)    Types: Cigarettes    Start date: 11/19/1963    Quit date: 11/18/1993    Years since quitting: 30.6   Smokeless tobacco: Never   Tobacco comments:    quit in the 90's  Vaping Use   Vaping status: Never Used  Substance and Sexual Activity   Alcohol use: No   Drug use: No   Sexual activity: Not on file  Other Topics Concern   Not on file  Social History Narrative   Pt lives at home alone.   Social Drivers of Corporate investment banker Strain: Low Risk  (08/01/2023)   Overall Financial Resource Strain (CARDIA)    Difficulty of Paying Living Expenses: Not very hard  Food Insecurity: No Food Insecurity (07/12/2023)   Hunger Vital Sign    Worried About Running Out of Food in the Last Year: Never true    Ran Out of Food in the Last Year: Never true  Transportation Needs: No Transportation Needs (09/03/2023)   PRAPARE - Administrator, Civil Service (Medical): No    Lack of Transportation (Non-Medical): No  Physical Activity: Inactive (07/19/2023)   Exercise Vital Sign    Days of Exercise per Week: 0 days    Minutes of Exercise per Session: 0 min  Stress: Not on file  Social Connections: Not on file    Family History  Problem Relation Age of Onset   Prostate cancer Father        deceased   Stroke Mother        deceased   Colon cancer Neg Hx     Outpatient Encounter Medications as of 06/17/2024  Medication Sig   albuterol  (VENTOLIN  HFA) 108 (90 Base) MCG/ACT inhaler Inhale 1-2 puffs into the lungs every 6 (six) hours as needed for shortness of breath or wheezing.   allopurinol (ZYLOPRIM) 100 MG tablet Take 100 mg by mouth daily.    ALPRAZolam  (XANAX ) 1 MG tablet Take 1 mg by mouth at bedtime as needed for anxiety or sleep.   ascorbic acid (VITAMIN C) 500 MG tablet Take 500 mg by mouth daily.   Cholecalciferol (VITAMIN D3) 50 MCG (2000 UT) TABS Take  2,000 Units by mouth daily.   Continuous Glucose Sensor (DEXCOM G7 SENSOR) MISC by Does not apply route.   DULoxetine (CYMBALTA) 60 MG capsule Take 60 mg by mouth daily.   finasteride  (PROSCAR ) 5 MG tablet Take 1 tablet (5 mg total) by mouth daily.   furosemide  (LASIX ) 20 MG tablet Take 1 tablet (20 mg total)  by mouth daily.   gabapentin (NEURONTIN) 300 MG capsule Take 600 mg by mouth at bedtime.   glipiZIDE  (GLUCOTROL  XL) 5 MG 24 hr tablet Take 1 tablet (5 mg total) by mouth daily with breakfast.   metFORMIN  (GLUCOPHAGE -XR) 500 MG 24 hr tablet Take 2 tablets (1,000 mg total) by mouth daily with breakfast.   NONFORMULARY OR COMPOUNDED ITEM Superquadmix with Prostin  (prostaglandin) 40 mcg/ml. Papaverine 30mg /ml, Regitine  4mg  and Atropine400mcg/ml.  Dispense 5 ml total. Inject 1 ml into penis prior to sex as directed, no more than once every other day. Alternate injection sites (right versus left).   pantoprazole  (PROTONIX ) 40 MG tablet TAKE ONE TABLET (40MG  TOTAL) BY MOUTH TWO TIMES DAILY BEFORE A MEAL   rosuvastatin  (CRESTOR ) 10 MG tablet TAKE ONE TABLET (10MG  TOTAL) BY MOUTH DAILY   sildenafil (VIAGRA) 50 MG tablet Take 50-100 mg by mouth daily as needed.   tamsulosin  (FLOMAX ) 0.4 MG CAPS capsule Take 2 capsules (0.8 mg total) by mouth daily.   traMADol  (ULTRAM ) 50 MG tablet Take 1 tablet (50 mg total) by mouth every 6 (six) hours as needed for moderate pain (pain.).   warfarin (COUMADIN ) 5 MG tablet TAKE ONE TABLET BY MOUTH DAILY EXCEPT TAKE ONE AND ONE-HALF TABLETS ON TUESDAYS AND FRIDAYS OR AS DIRECTED   fexofenadine (ALLEGRA) 180 MG tablet Take 180 mg by mouth daily. (Patient not taking: Reported on 06/17/2024)   [DISCONTINUED] pioglitazone  (ACTOS ) 30 MG tablet Take 1 tablet (30 mg total) by mouth daily. (Patient not taking: Reported on 06/17/2024)   No facility-administered encounter medications on file as of 06/17/2024.    ALLERGIES: Allergies  Allergen Reactions   Prednisone     Patient  state that this medication made his blood sugar go real high    VACCINATION STATUS: Immunization History  Administered Date(s) Administered   Influenza-Unspecified 09/19/2013, 08/19/2022    Diabetes He presents for his follow-up diabetic visit. He has type 2 diabetes mellitus. Onset time: diagnosed at approximate age of 57. His disease course has been stable. There are no hypoglycemic associated symptoms. Pertinent negatives for hypoglycemia include no confusion, headaches, seizures or tremors. Associated symptoms include fatigue and foot paresthesias. Pertinent negatives for diabetes include no chest pain, no foot ulcerations, no polydipsia, no polyuria, no visual change and no weight loss. There are no hypoglycemic complications. Symptoms are stable. Diabetic complications include heart disease, impotence and peripheral neuropathy. Risk factors for coronary artery disease include diabetes mellitus, dyslipidemia, hypertension, male sex, obesity and sedentary lifestyle. Current diabetic treatment includes oral agent (triple therapy). He is compliant with treatment most of the time. His weight is fluctuating minimally. He is following a generally unhealthy diet. When asked about meal planning, he reported none. He has not had a previous visit with a dietitian. He rarely participates in exercise. His home blood glucose trend is decreasing steadily. His overall blood glucose range is 140-180 mg/dl. (He presents today with his CGM showing mostly at goal glycemic profile overall.  His POCT A1c today is 7.2%, increasing from last visit of 6.9%.  Analysis of his CGM shows TIR 64%, TAR 36%, TBR 0% with a GMI of 7.4%.  He had cardiac cath recently showing signs of mild CHF, for which cardiology recommended stopping the Pioglitazone .) An ACE inhibitor/angiotensin II receptor blocker is not being taken. He does not see a podiatrist.Eye exam is current.    Review of systems  Constitutional: + Minimally  fluctuating body weight,  current Body mass index is 30.82 kg/m. , +  fatigue, no subjective hyperthermia, no subjective hypothermia, + daytime sleepiness Eyes: no blurry vision, no xerophthalmia ENT: no sore throat, no nodules palpated in throat, no dysphagia/odynophagia, no hoarseness Cardiovascular: no chest pain, no shortness of breath, no palpitations, no leg swelling Respiratory: no cough, no shortness of breath Gastrointestinal: no nausea/vomiting/diarrhea Musculoskeletal: + chronic intermittent back pain, unsteady on feet (has fallen several times) Skin: no rashes, no hyperemia Neurological: no tremors, + numbness/tingling to BLE and hands- cannot feel feet most days, + intermittent dizziness Psychiatric: no depression, no anxiety   Objective:    BP 114/68 (BP Location: Left Arm, Patient Position: Sitting, Cuff Size: Large)   Pulse 71   Ht 5' 11 (1.803 m)   Wt 221 lb (100.2 kg)   BMI 30.82 kg/m   Wt Readings from Last 3 Encounters:  06/17/24 221 lb (100.2 kg)  06/09/24 221 lb 6.4 oz (100.4 kg)  05/15/24 214 lb (97.1 kg)    BP Readings from Last 3 Encounters:  06/17/24 114/68  06/09/24 130/62  05/15/24 115/65     Physical Exam- Limited  Constitutional:  Body mass index is 30.82 kg/m. , not in acute distress, normal state of mind Eyes:  EOMI, no exophthalmos Cardiovascular: nonpitting edema to BLE Musculoskeletal: no gross deformities, strength intact in all four extremities, no gross restriction of joint movements, did appear to lose his balance when standing after the visit  Skin:  no rashes, no hyperemia Neurological: no tremor with outstretched hands   Diabetic Foot Exam - Simple   No data filed     CMP ( most recent) CMP     Component Value Date/Time   NA 142 06/16/2024 0835   K 3.8 06/16/2024 0835   CL 104 06/16/2024 0835   CO2 23 06/16/2024 0835   GLUCOSE 99 06/16/2024 0835   GLUCOSE 203 (H) 05/06/2024 1433   BUN 16 06/16/2024 0835   CREATININE  1.08 06/16/2024 0835   CREATININE 1.16 07/03/2019 1343   CALCIUM  9.1 06/16/2024 0835   PROT 5.8 (L) 06/16/2024 0835   ALBUMIN 4.1 06/16/2024 0835   AST 17 06/16/2024 0835   ALT 13 06/16/2024 0835   ALKPHOS 57 06/16/2024 0835   BILITOT 0.4 06/16/2024 0835   GFRNONAA >60 05/06/2024 1433   GFRAA >60 08/12/2020 1336     Diabetic Labs (most recent): Lab Results  Component Value Date   HGBA1C 7.2 (A) 06/17/2024   HGBA1C 6.9 (A) 02/12/2024   HGBA1C 7.0 (A) 10/14/2023   MICROALBUR 10 mg/L 06/10/2023   MICROALBUR 10 02/21/2022   MICROALBUR 30 02/23/2021     Lipid Panel ( most recent) Lipid Panel  No results found for: CHOL, TRIG, HDL, CHOLHDL, VLDL, LDLCALC, LDLDIRECT, LABVLDL    Lab Results  Component Value Date   TSH 4.040 06/16/2024   TSH 2.420 05/25/2022   TSH 3.235 05/02/2018   FREET4 1.12 06/16/2024   FREET4 1.10 05/25/2022         Assessment & Plan:   1) Type 2 diabetes mellitus with hyperglycemia, without long-term current use of insulin  (HCC)  He presents today with his CGM showing mostly at goal glycemic profile overall.  His POCT A1c today is 7.2%, increasing from last visit of 6.9%.  Analysis of his CGM shows TIR 64%, TAR 36%, TBR 0% with a GMI of 7.4%.  He had cardiac cath recently showing signs of mild CHF, for which cardiology recommended stopping the Pioglitazone .  - Vaughan JONELLE Rigg has currently uncontrolled symptomatic type 2 DM  since 83 years of age.   Recent labs reviewed.  - I had a long discussion with him about the progressive nature of diabetes and the pathology behind its complications.  -his diabetes is complicated by CAD, peripheral neuropathy and he remains at a high risk for more acute and chronic complications which include CAD, CVA, CKD, retinopathy, and neuropathy. These are all discussed in detail with him.  - Nutritional counseling repeated at each appointment due to patients tendency to fall back in to old habits.  -  The patient admits there is a room for improvement in their diet and drink choices. -  Suggestion is made for the patient to avoid simple carbohydrates from their diet including Cakes, Sweet Desserts / Pastries, Ice Cream, Soda (diet and regular), Sweet Tea, Candies, Chips, Cookies, Sweet Pastries, Store Bought Juices, Alcohol in Excess of 1-2 drinks a day, Artificial Sweeteners, Coffee Creamer, and Sugar-free Products. This will help patient to have stable blood glucose profile and potentially avoid unintended weight gain.   - I encouraged the patient to switch to unprocessed or minimally processed complex starch and increased protein intake (animal or plant source), fruits, and vegetables.   - Patient is advised to stick to a routine mealtimes to eat 3 meals a day and avoid unnecessary snacks (to snack only to correct hypoglycemia).  - I have approached him with the following individualized plan to manage  his diabetes and patient agrees:   -He is advised to continue Metformin  1000 mg ER daily and Glipizide  5 mg po daily.  He is advised to STOP the Actos  due to recent CHF diagnosis.  May consider adding SGLT2i in the future but he is hesitant as common side effect is polyuria which he already suffers from.  He notes he has not yet started the fluid pill the cardiologist prescribed due to that fact.  -He did qualify for PAP for Rybelsus , unfortunately he did not tolerate that medication due to vertigo.  He prefers to stay away from injectables at this time.  -He is encouraged to monitor blood glucose at least twice daily (using his CGM), before breakfast and before bed, and to notify the clinic if his glucose readings are less than 70 or above 300 for 3 tests in a row.  He has benefited from CGM device, is advised to continue wearing it.  - Specific targets for  A1c;  LDL, HDL,  and Triglycerides were discussed with the patient.  2) Blood Pressure /Hypertension:  His blood pressure is controlled  to target without the use of antihypertensive medications.  3) Lipids/Hyperlipidemia:  There are no recent lipid panel results to review, nor does he take any medication for high cholesterol.  He reports he has had his cholesterol checked at his PCP, although no records have been sent to us .   4)  Weight/Diet:  His Body mass index is 30.82 kg/m.  - complicating his diabetes care.   he is a candidate for some weight loss. I discussed with him the fact that loss of 5 - 10% of his  current body weight will have the most impact on his diabetes management.  Exercise, and detailed carbohydrates information provided  -  detailed on discharge instructions.  5) Chronic Care/Health Maintenance: -he is encouraged to initiate and continue to follow up with Ophthalmology, Dentist,  Podiatrist at least yearly or according to recommendations, and advised to stay away from smoking and/or secondhand smoke exposure. I have recommended yearly flu vaccine and pneumonia  vaccine at least every 5 years; moderate intensity exercise for up to 150 minutes weekly; and  sleep for at least 7 hours a day.    - he is advised to maintain close follow up with Bertell Satterfield, MD for primary care needs, as well as his other providers for optimal and coordinated care.     I spent  37  minutes in the care of the patient today including review of labs from CMP, Lipids, Thyroid  Function, Hematology (current and previous including abstractions from other facilities); face-to-face time discussing  his blood glucose readings/logs, discussing hypoglycemia and hyperglycemia episodes and symptoms, medications doses, his options of short and long term treatment based on the latest standards of care / guidelines;  discussion about incorporating lifestyle medicine;  and documenting the encounter. Risk reduction counseling performed per USPSTF guidelines to reduce obesity and cardiovascular risk factors.     Please refer to Patient  Instructions for Blood Glucose Monitoring and Insulin /Medications Dosing Guide  in media tab for additional information. Please  also refer to  Patient Self Inventory in the Media  tab for reviewed elements of pertinent patient history.  Vaughan JONELLE Rigg participated in the discussions, expressed understanding, and voiced agreement with the above plans.  All questions were answered to his satisfaction. he is encouraged to contact clinic should he have any questions or concerns prior to his return visit.    Follow up plan: - Return in about 3 months (around 09/17/2024) for Diabetes F/U with A1c in office, No previsit labs, Bring meter and logs.  Benton Rio, [facility] [address] [place], [place] [address] Phone: [phone] Fax: [phone]  06/17/2024, 4:01 PM

## 2024-06-18 ENCOUNTER — Encounter: Admitting: Neurology

## 2024-06-22 ENCOUNTER — Ambulatory Visit: Attending: Cardiology | Admitting: *Deleted

## 2024-06-22 DIAGNOSIS — I4891 Unspecified atrial fibrillation: Secondary | ICD-10-CM | POA: Diagnosis not present

## 2024-06-22 DIAGNOSIS — Z5181 Encounter for therapeutic drug level monitoring: Secondary | ICD-10-CM | POA: Insufficient documentation

## 2024-06-22 LAB — POCT INR: INR: 1.5 — AB (ref 2.0–3.0)

## 2024-06-22 NOTE — Patient Instructions (Signed)
Take warfarin 1 1/2 tablets tonight and tomorrow night then resume 1 tablet daily   Recheck in 2 wks

## 2024-06-23 DIAGNOSIS — N400 Enlarged prostate without lower urinary tract symptoms: Secondary | ICD-10-CM | POA: Diagnosis not present

## 2024-06-23 DIAGNOSIS — E663 Overweight: Secondary | ICD-10-CM | POA: Diagnosis not present

## 2024-06-23 DIAGNOSIS — R7989 Other specified abnormal findings of blood chemistry: Secondary | ICD-10-CM | POA: Diagnosis not present

## 2024-06-23 DIAGNOSIS — R5383 Other fatigue: Secondary | ICD-10-CM | POA: Diagnosis not present

## 2024-06-23 DIAGNOSIS — D649 Anemia, unspecified: Secondary | ICD-10-CM | POA: Diagnosis not present

## 2024-06-23 DIAGNOSIS — G629 Polyneuropathy, unspecified: Secondary | ICD-10-CM | POA: Diagnosis not present

## 2024-06-23 DIAGNOSIS — M47816 Spondylosis without myelopathy or radiculopathy, lumbar region: Secondary | ICD-10-CM | POA: Diagnosis not present

## 2024-06-23 DIAGNOSIS — E1140 Type 2 diabetes mellitus with diabetic neuropathy, unspecified: Secondary | ICD-10-CM | POA: Diagnosis not present

## 2024-06-23 DIAGNOSIS — Z6829 Body mass index (BMI) 29.0-29.9, adult: Secondary | ICD-10-CM | POA: Diagnosis not present

## 2024-06-24 ENCOUNTER — Institutional Professional Consult (permissible substitution): Admitting: Neurology

## 2024-06-24 ENCOUNTER — Other Ambulatory Visit: Payer: Self-pay

## 2024-06-24 ENCOUNTER — Emergency Department (HOSPITAL_COMMUNITY)
Admission: EM | Admit: 2024-06-24 | Discharge: 2024-06-24 | Disposition: A | Discharge status: Home / Self Care | Source: Ambulatory Visit | Attending: Emergency Medicine | Admitting: Emergency Medicine

## 2024-06-24 ENCOUNTER — Encounter (HOSPITAL_COMMUNITY): Payer: Self-pay | Admitting: *Deleted

## 2024-06-24 ENCOUNTER — Emergency Department (HOSPITAL_COMMUNITY)

## 2024-06-24 DIAGNOSIS — Z7901 Long term (current) use of anticoagulants: Secondary | ICD-10-CM | POA: Insufficient documentation

## 2024-06-24 DIAGNOSIS — E119 Type 2 diabetes mellitus without complications: Secondary | ICD-10-CM | POA: Insufficient documentation

## 2024-06-24 DIAGNOSIS — Z6829 Body mass index (BMI) 29.0-29.9, adult: Secondary | ICD-10-CM | POA: Diagnosis not present

## 2024-06-24 DIAGNOSIS — I10 Essential (primary) hypertension: Secondary | ICD-10-CM | POA: Insufficient documentation

## 2024-06-24 DIAGNOSIS — R0789 Other chest pain: Secondary | ICD-10-CM | POA: Insufficient documentation

## 2024-06-24 DIAGNOSIS — E663 Overweight: Secondary | ICD-10-CM | POA: Diagnosis not present

## 2024-06-24 DIAGNOSIS — R600 Localized edema: Secondary | ICD-10-CM | POA: Insufficient documentation

## 2024-06-24 DIAGNOSIS — Z79899 Other long term (current) drug therapy: Secondary | ICD-10-CM | POA: Insufficient documentation

## 2024-06-24 DIAGNOSIS — Z7984 Long term (current) use of oral hypoglycemic drugs: Secondary | ICD-10-CM | POA: Insufficient documentation

## 2024-06-24 DIAGNOSIS — R079 Chest pain, unspecified: Secondary | ICD-10-CM

## 2024-06-24 LAB — CBC
HCT: 37.8 % — ABNORMAL LOW (ref 39.0–52.0)
Hemoglobin: 12.5 g/dL — ABNORMAL LOW (ref 13.0–17.0)
MCH: 31.3 pg (ref 26.0–34.0)
MCHC: 33.1 g/dL (ref 30.0–36.0)
MCV: 94.7 fL (ref 80.0–100.0)
Platelets: 120 K/uL — ABNORMAL LOW (ref 150–400)
RBC: 3.99 MIL/uL — ABNORMAL LOW (ref 4.22–5.81)
RDW: 13.7 % (ref 11.5–15.5)
WBC: 7.7 K/uL (ref 4.0–10.5)
nRBC: 0 % (ref 0.0–0.2)

## 2024-06-24 LAB — BASIC METABOLIC PANEL WITH GFR
Anion gap: 9 (ref 5–15)
BUN: 15 mg/dL (ref 8–23)
CO2: 24 mmol/L (ref 22–32)
Calcium: 8.6 mg/dL — ABNORMAL LOW (ref 8.9–10.3)
Chloride: 103 mmol/L (ref 98–111)
Creatinine, Ser: 1.02 mg/dL (ref 0.61–1.24)
GFR, Estimated: >60 mL/min (ref >60)
Glucose, Bld: 143 mg/dL — ABNORMAL HIGH (ref 70–99)
Potassium: 3.6 mmol/L (ref 3.5–5.1)
Sodium: 136 mmol/L (ref 135–145)

## 2024-06-24 LAB — TROPONIN I (HIGH SENSITIVITY)
Troponin I (High Sensitivity): 7 ng/L (ref <18)
Troponin I (High Sensitivity): 7 ng/L

## 2024-06-24 LAB — D-DIMER, QUANTITATIVE: D-Dimer, Quant: <0.27 ug{FEU}/mL (ref 0.00–0.50)

## 2024-06-24 NOTE — ED Triage Notes (Signed)
 Pt with right sided CP and not able to take a deep breath since last night. Seen Dr. Bertell and sent here. Pt states pain to right jaw as well.

## 2024-06-24 NOTE — ED Notes (Signed)
 Pt reports being given 60 mg Toradol  this morning for hip pain just wanted staff aware.

## 2024-06-24 NOTE — Discharge Instructions (Signed)
You were seen for your chest pain in the emergency department.   At home, please take Tylenol for your pain.    Follow-up with your primary doctor in 2-3 days regarding your visit.  Cardiology will be calling you regarding an appointment within the next 72 hours.  You may contact them if you do not hear from them in that time using the information in this packet.  Return immediately to the emergency department if you experience any of the following: Worsening pain, difficulty breathing, unexplained vomiting or sweating, or any other concerning symptoms.    Thank you for visiting our Emergency Department. It was a pleasure taking care of you today.

## 2024-06-24 NOTE — ED Provider Notes (Signed)
 [place] EMERGENCY DEPARTMENT AT [facility] Provider Note   CSN: [id number] Arrival date & time: 06/24/24  1037     Patient presents with: Chest Pain   [name] is a 83 y.o. male.   83 year old male with a history of carotid artery stenosis, atrial fibrillation, hypertension, diabetes, and hyperlipidemia who presents to the emergency department chest discomfort.  Patient reports that yesterday he started experiencing some chest discomfort in his right upper chest that radiates up his right neck.  Not exertional but is pleuritic.  No diaphoresis.  Nausea but no vomiting.  Says he gets short of breath when he takes deep breath but no other shortness of breath.  No cough.  Has chronic leg swelling but no changes to this.  Denies history of cancer, hormone use, DVT or PE.  No stents in his heart.  Did have a cardiac Catheterization on 04/2024 with the impression below:   Ost LM to Mid LM lesion is 5% stenosed-more of the bend than a lesion.   Prox LAD lesion is 45% stenosed.  RFR 0.96   Ost RCA to Prox RCA lesion is 20% stenosed. Prox RCA lesion is 40% stenosed.   Hemodynamic findings consistent with mild pulmonary hypertension with mildly elevated LVEDP suggestive of pulmonary venous congestion.   There is no aortic valve stenosis.       Prior to Admission medications   Medication Sig Start Date End Date Taking? Authorizing Provider  albuterol  (VENTOLIN  HFA) 108 (90 Base) MCG/ACT inhaler Inhale 1-2 puffs into the lungs every 6 (six) hours as needed for shortness of breath or wheezing. 12/23/23   [provider]  allopurinol (ZYLOPRIM) 100 MG tablet Take 100 mg by mouth daily.  02/12/19   [provider]  ALPRAZolam  (XANAX ) 1 MG tablet Take 1 mg by mouth at bedtime as needed for anxiety or sleep.    [provider]  ascorbic acid (VITAMIN C) 500 MG tablet Take 500 mg by mouth daily.    [provider]  Cholecalciferol (VITAMIN D3)  50 MCG (2000 UT) TABS Take 2,000 Units by mouth daily.    [provider]  Continuous Glucose Sensor (DEXCOM G7 SENSOR) MISC by Does not apply route.    [provider]  DULoxetine (CYMBALTA) 60 MG capsule Take 60 mg by mouth daily. 10/20/20   [provider]  fexofenadine (ALLEGRA) 180 MG tablet Take 180 mg by mouth daily. Patient not taking: Reported on 06/17/2024    [provider]  finasteride  (PROSCAR ) 5 MG tablet Take 1 tablet (5 mg total) by mouth daily. 05/14/24   Watt Rush, MD  furosemide  (LASIX ) 20 MG tablet Take 1 tablet (20 mg total) by mouth daily. 06/09/24   Strader, Laymon HERO, PA-C  gabapentin (NEURONTIN) 300 MG capsule Take 600 mg by mouth at bedtime.    [provider]  glipiZIDE  (GLUCOTROL  XL) 5 MG 24 hr tablet Take 1 tablet (5 mg total) by mouth daily with breakfast. 10/14/23   Therisa Benton PARAS, NP  metFORMIN  (GLUCOPHAGE -XR) 500 MG 24 hr tablet Take 2 tablets (1,000 mg total) by mouth daily with breakfast. 10/14/23   Therisa Benton PARAS, NP  NONFORMULARY OR COMPOUNDED ITEM Superquadmix with Prostin  (prostaglandin) 40 mcg/ml. Papaverine 30mg /ml, Regitine  4mg  and Atropine400mcg/ml.  Dispense 5 ml total. Inject 1 ml into penis prior to sex as directed, no more than once every other day. Alternate injection sites (right versus left). 05/14/24   Watt Rush, MD  pantoprazole  (  PROTONIX ) 40 MG tablet TAKE ONE TABLET (40MG  TOTAL) BY MOUTH TWO TIMES DAILY BEFORE A MEAL 06/15/24   Ezzard Sonny RAMAN, PA-C  rosuvastatin  (CRESTOR ) 10 MG tablet TAKE ONE TABLET (10MG  TOTAL) BY MOUTH DAILY 05/08/24   Debera Jayson MATSU, MD  sildenafil (VIAGRA) 50 MG tablet Take 50-100 mg by mouth daily as needed. 05/30/24   [provider]  tamsulosin  (FLOMAX ) 0.4 MG CAPS capsule Take 2 capsules (0.8 mg total) by mouth daily. 05/14/24   Watt Rush, MD  traMADol  (ULTRAM ) 50 MG tablet Take 1 tablet (50 mg total) by mouth every 6 (six) hours as needed for moderate pain  (pain.). 08/18/20   Wrenn, John, MD  warfarin (COUMADIN ) 5 MG tablet TAKE ONE TABLET BY MOUTH DAILY EXCEPT TAKE ONE AND ONE-HALF TABLETS ON TUESDAYS AND FRIDAYS OR AS DIRECTED 04/28/24   Debera Jayson MATSU, MD    Allergies: Prednisone    Review of Systems  Updated Vital Signs BP (!) 116/59   Pulse 88   Temp 98 F (36.7 C) (Oral)   Resp 18   Ht 5' 9 (1.753 m)   Wt 96.2 kg   SpO2 100%   BMI 31.31 kg/m   Physical Exam Vitals and nursing note reviewed.  Constitutional:      General: He is not in acute distress.    Appearance: He is well-developed.  HENT:     Head: Normocephalic and atraumatic.     Right Ear: External ear normal.     Left Ear: External ear normal.     Nose: Nose normal.  Eyes:     Extraocular Movements: Extraocular movements intact.     Conjunctiva/sclera: Conjunctivae normal.     Pupils: Pupils are equal, round, and reactive to light.  Cardiovascular:     Rate and Rhythm: Normal rate and regular rhythm.     Heart sounds: Normal heart sounds.     Comments: Radial pulses 2+ bilaterally.  Chest pain not reproducible.  No overlying rashes. Pulmonary:     Effort: Pulmonary effort is normal. No respiratory distress.     Breath sounds: Normal breath sounds.  Musculoskeletal:     Cervical back: Normal range of motion and neck supple.     Right lower leg: Edema (1+) present.     Left lower leg: Edema (1+) present.  Skin:    General: Skin is warm and dry.  Neurological:     Mental Status: He is alert. Mental status is at baseline.  Psychiatric:        Mood and Affect: Mood normal.        Behavior: Behavior normal.     (all labs ordered are listed, but only abnormal results are displayed) Labs Reviewed  BASIC METABOLIC PANEL WITH GFR - Abnormal; Notable for the following components:      Result Value   Glucose, Bld 143 (*)    Calcium  8.6 (*)    All other components within normal limits  CBC - Abnormal; Notable for the following components:   RBC 3.99  (*)    Hemoglobin 12.5 (*)    HCT 37.8 (*)    Platelets 120 (*)    All other components within normal limits  D-DIMER, QUANTITATIVE  TROPONIN I (HIGH SENSITIVITY)  TROPONIN I (HIGH SENSITIVITY)    EKG: EKG Interpretation Date/Time:  Wednesday June 24 2024 11:05:30 EDT Ventricular Rate:  49 PR Interval:  220 QRS Duration:  86 QT Interval:  416 QTC Calculation: 375 R Axis:  30  Text Interpretation: Sinus bradycardia with marked sinus arrhythmia with 1st degree A-V block Low voltage QRS Borderline ECG When compared with ECG of 15-May-2024 09:43, Sinus rhythm has replaced Atrial fibrillation Nonspecific T wave abnormality no longer evident in Anterolateral leads QT has shortened Confirmed by Yolande Charleston [phone]) on 06/24/2024 12:23:57 PM  Radiology: DG Chest 2 View Result Date: 06/24/2024 CLINICAL DATA:  Chest pain. EXAM: CHEST - 2 VIEW COMPARISON:  June 11, 2023. FINDINGS: Stable cardiomediastinal silhouette. No acute pulmonary disease is noted. Bony thorax is unremarkable. IMPRESSION: No active cardiopulmonary disease. Electronically Signed   By: Lynwood Landy Raddle M.D.   On: 06/24/2024 12:37     Procedures   Medications Ordered in the ED - No data to display                                  Medical Decision Making Amount and/or Complexity of Data Reviewed Labs: ordered. Radiology: ordered.   [name] is a 83 y.o. male with comorbidities that complicate the patient evaluation including carotid artery stenosis, atrial fibrillation, hypertension, diabetes, and hyperlipidemia who presented to the emergency department with chest pain  Initial Ddx:  MI, PE, pneumonia, dissection, pericarditis, costochondritis, reflux  MDM:  Patient presents emergency department with a day of pleuritic chest pain in the right upper chest.  With the patient's chest discomfort will obtain EKG and troponins to evaluate for MI.  Also considering pulmonary embolism but patient is not high  risk so we will obtain a D-dimer at this time.  Considered dissection but with their symmetric pulses, history, and description of the pain feel it is less likely.  If chest x-ray reveals widened mediastinum or any other concerning findings will consider CTA.  Also considered pericarditis but description is unlikely and they do not have risk factors for this diagnosis.  Chest pain not reproducible so feel it costochondritis less likely.  No infectious symptoms to suggest pneumonia at this time that would be causing pleuritic chest pain.  Plan:  Labs Troponin D-dimer EKG Chest x-ray  ED Summary/Re-evaluation:  EKG showed sinus bradycardia without any ischemic changes.  Had serial troponins that were stable at 7.  Feel that MI highly unlikely given his recent heart catheterization and workup today.  D-dimer was undetectably low making PE and dissection highly unlikely as well especially since the mediastinum was normal.  Did have borderline low platelets.  Lab work otherwise unremarkable.  Chest x-ray unremarkable.  Likely has musculoskeletal pain that is causing his symptoms.  Home follow-up with his primary doctor in several days and cardiology as well  This patient presents to the ED for concern of complaints listed in HPI, this involves an extensive number of treatment options, and is a complaint that carries with it a high risk of complications and morbidity. Disposition including potential need for admission considered.   Dispo: DC Home. Return precautions discussed including, but not limited to, those listed in the AVS. Allowed pt time to ask questions which were answered fully prior to dc.  Additional history obtained from spouse Records reviewed Outpatient Clinic Notes The following labs were independently interpreted: Chemistry and show no acute abnormality I independently reviewed the following imaging with scope of interpretation limited to determining acute life threatening conditions  related to emergency care: Chest x-ray and agree with the radiologist interpretation with the following exceptions: none I personally reviewed and interpreted cardiac monitoring:  normal sinus rhythm  I personally reviewed and interpreted the pt's EKG: see above for interpretation  I have reviewed the patients home medications and made adjustments as needed Social Determinants of health:  Geriatric   Final diagnoses:  Chest pain, unspecified type    ED Discharge Orders          Ordered    Ambulatory referral to Cardiology        06/24/24 1339               Yolande Lamar BROCKS, MD 06/24/24 1429

## 2024-06-25 ENCOUNTER — Ambulatory Visit (INDEPENDENT_AMBULATORY_CARE_PROVIDER_SITE_OTHER): Admitting: Neurology

## 2024-06-25 DIAGNOSIS — G629 Polyneuropathy, unspecified: Secondary | ICD-10-CM | POA: Diagnosis not present

## 2024-06-25 DIAGNOSIS — G5601 Carpal tunnel syndrome, right upper limb: Secondary | ICD-10-CM

## 2024-06-25 NOTE — Progress Notes (Unsigned)
 Patient is here today for right hand numbness and tingling. He states the sensory changes are mostly in digits 1-3, and often waking him up at night. Additionally, there is pain and mild weakness in the right hand. He reports that his left hand is starting to feel the same. In addition, he reports aching in his feet and that his feet feel cold all the time even when warm to the touch. He denies neck pain or low back pain or radicular symptoms.   I discussed results of emg/ncs with patient. He has severe right carpal tunnel Syndrome(CTS). His left hand is mild at this time. We discussed CTS and what it means when results state severe. CTS was discussed including options for treatment, causes, reviewed images with him. Will discuss with dr sater  Also discussed neuropathy, most common course is diabetes but d send to hand surgery. As far as polyneuropathy, Dr. Vear checked sjogren's ab, ife/spep, vitamin b12 which were nremarkable. Mos common cause of peripheral neuropath is diabetes which patient has a long-standing histor of (last hgba1c 6.9 but has been > 8 within the last few years) advised to closely manage with pcp.   Orders Placed This Encounter  Procedures   Ambulatory referral to Hand Surgery    I spent 21 minutes of face-to-face and non-face-to-face time with patient on the  1. Polyneuropathy   2. Right carpal tunnel syndrome    diagnosis.  This included previsit chart review, lab review, study review, order entry, electronic health record documentation, patient education on the different diagnostic and therapeutic options, counseling and coordination of care, risks and benefits of management, compliance, or risk factor reduction. This does not include time spent on emg/ncs.

## 2024-06-25 NOTE — Patient Instructions (Addendum)
 Pinched Nerve in the Wrist (Carpal Tunnel Syndrome): What to Know  Pinched nerve in the wrist (carpal tunnel syndrome, or CTS) is a nerve problem that causes pain, numbness, and weakness in the wrist, hand, and fingers. The carpal tunnel is a narrow space that is on the palm side of your wrist. Repeated wrist motions or certain diseases may cause swelling in the tunnel. This swelling can pinch the main nerve in the wrist (the median nerve). What are the causes? CTS may be caused by: Moving your hand and wrist over and over again while doing a task. Hurting the wrist. Arthritis. A pocket of fluid (cyst) or a growth (tumor) in the carpal tunnel. Fluid buildup when you are pregnant. Use of tools that vibrate. In some cases, the cause of CTS is not known. What increases the risk? You're more likely to have CTS if: You have a job that makes you do these things: Move your hand firmly over and over again. Work with tools that vibrate, such as drills or sanders. You're male. You have diabetes, obesity, thyroid problems, or kidney failure. What are the signs or symptoms? Symptoms of this condition include: A tingling feeling in your fingers. You may feel this pain in the thumb, index finger, or middle finger. Tingling or loss of feeling in your hand. Pain in your entire arm. This pain may get worse when you bend your wrist and elbow for a long time. Pain in your wrist that goes up your arm to your shoulder. Pain that goes down into your palm or fingers. Weakness in your hands. You may find it hard to grab and hold items. Your symptoms may feel worse during the night. How is this diagnosed? CTS is diagnosed with a medical history and physical exam. Tests and imaging may also be done to: Check the electrical signals sent by your nerves into the muscles. Check how well electrical signals pass through your nerves. Check possible causes of your CTS. These include X-rays, ultrasound, and  MRI. How is this treated? CTS may be treated with: Lifestyle changes. You will be asked to stop or change the activity that caused your problem. Physical therapy. This may include: Exercises that stretch and strengthen the muscles and tendons in the wrist and hand. Nerve gliding or flossing exercises. These help keep nerves moving smoothly through the tissues around them. Occupational therapy. You'll learn how to use your hand again. Medicines for pain and swelling. You may have injections in your wrist. A wrist splint or brace. Surgery. Follow these instructions at home: If you have a splint or brace: Wear the splint or brace as told. Take it off only if your provider says you can. Check the skin around it every day. Tell your provider if you see problems. Loosen the splint or brace if your fingers tingle, are numb, or turn cold and blue. Keep the splint or brace clean and dry. If the splint or brace isn't waterproof: Do not let it get wet. Cover it when you take a bath or shower. Use a cover that doesn't let any water in. Managing pain, stiffness, and swelling  Use ice or an ice pack as told. If you have a splint or brace that you can take off, remove it only as told. Place a towel between your skin and the ice. Leave the ice on for 20 minutes, 2-3 times a day. If your skin turns red, take off the ice right away to prevent skin damage. The risk  of damage is higher if you can't feel pain, heat, or cold. Move your fingers often to reduce stiffness and swelling. General instructions Take your medicines only as told. Rest your wrist and hand from activity that may cause pain. If your CTS is caused by things you do at work, talk with your employer about making changes. For example, you may need a wrist pad to use while typing. Exercise as told. Follow instructions on how to do nerve gliding or flossing exercises. These help keep nerves in moving smoothly through the tissues around  them. Keep all follow-up visits. This is important. Where to find more information American Academy of Orthopedic Surgeons: orthoinfo.aaos.[employer] of Neurological Disorders and Stroke: BasicFM.no Contact a health care provider if: You have new symptoms. Your pain is not controlled with medicines. Your symptoms get worse. Get help right away if: Your hand or wrist tingles or is numb, and the symptoms become very bad. This information is not intended to replace advice given to you by your health care provider. Make sure you discuss any questions you have with your health care provider. Document Revised: 09/17/2023 Document Reviewed: 07/05/2023 Elsevier Patient Education  2024 [employer].

## 2024-06-29 NOTE — Progress Notes (Signed)
 INR-1.5; Please see anticoagulation encounter

## 2024-06-29 NOTE — Progress Notes (Signed)
 Full Name: [name] Gender: Male MRN #: [id number] Date of Birth: Aug 01, 1941    Visit Date: 06/25/2024 11:56 Age: 83 Years  History: Patient reports right hand numbness and tingling often times waking him up in the middle of the night.  Additionally there is pain and mild weakness in the right hand.  His left hand is starting to feel the same.  He also reports symptoms in his feet such as feelings of coldness at night, aching.  He denies neck pain or low back pain or radicular symptoms.  Summary: Nerve Conduction Studies were performed on the bilateral upper extremities and right lower extremity. The right median APB motor nerve showed prolonged distal onset latency (4.9 ms, N<4.4) and reduced amplitude(0.4 mV, N<4). There was conduction block across the right wrist.  The right peroneal motor nerve showed decreased conduction velocity (36 m/s, normal greater than 44, fibular head to ankle) and decreased conduction velocity (36 m/s, normal greater than 44, pop fossa to fibular head).  The right tibial motor nerve showed reduced amplitude (2.2 mV, normal greater than 4) and decreased conduction velocity (32 m/s, normal greater than 41).  The right sural sensory nerve showed no response.  The right radial sensory nerve showed decreased amplitude (10 V, normal greater than 15).  The right superficial peroneal sensory nerve showed no response.  The right median ulnar transcarpal comparison nerve was unable to be performed as the median conduction at the palm showed no response.The left median/ulnar (palm) comparison nerve showed prolonged distal peak latency (Median Palm, 2.5 ms, N<2.2) and abnormal peak latency difference (Median Palm-Ulnar Palm, 0.4 ms, N<0.4) with a relative median delay.  The right Median 2nd Digit orthodromic sensory nerve showed No Response. The left  Median 2nd Digit orthodromic sensory nerve showed No response.  The right ulnar orthodromic sensory nerve showed delayed distal  peak latency (3.5 ms, normal less than 3.1).  The left ulnar orthodromic sensory nerve showed reduced amplitude (4 V, normal greater than 5). F Wave studies indicate that the right tibial F wave showed no response. All remaining nerves (as indicated in the following tables) were within normal limits.   EMG needle study was performed on the right upper and right lower extremities: The right opponens pollicis showed spontaneous activity, increased motor unit amplitude, prolonged motor unit duration, polyphasic motor units and diminished motor unit recruitment. All remaining muscles (as indicated in the following tables) were within normal limits.      Conclusion: There is severe right carpal tunnel syndrome. There is mild left Carpal Tunnel Syndrome. 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 Referred to hand surgery, see additional note for details.  SABRA   ------------------------------- Onetha Epp M.D.  [facility] [address], [address] [place], [place] [address] Tel: [phone] Fax: [phone]  Verbal informed consent was obtained from the patient, patient was informed of potential risk of procedure, including bruising, bleeding, hematoma formation, infection, muscle weakness, muscle pain, numbness, among others.        MNC    Nerve / Sites Muscle Latency Ref. Amplitude Ref. Rel Amp Segments Distance Velocity Ref. Area    ms ms mV mV %  cm m/s m/s mVms  R Median - APB     Wrist APB 4.9 <=4.4 0.4 >=4.0 100 Wrist - APB 7   0.5     Palm (upper arm NR) APB 3.4  1.3  296 Palm (upper arm NR) -  Wrist   >=49 1.6  L Median - APB     Wrist APB 4.3 <=4.4 4.8 >=4.0 100 Wrist - APB 7   16.4  R Ulnar - ADM     Wrist ADM 3.0 <=3.3 7.2 >=6.0 100 Wrist - ADM 7   20.4     B.Elbow ADM 7.0  5.6  78.9 B.Elbow - Wrist 22 56 >=49 17.9     A.Elbow ADM 8.6  6.0  107 A.Elbow - B.Elbow 9 55 >=49 18.2  L Ulnar - ADM     Wrist ADM 2.8 <=3.3 6.8  >=6.0 100 Wrist - ADM 7   21.4     B.Elbow ADM 7.0  5.7  84.2 B.Elbow - Wrist 23 55 >=49 20.2     A.Elbow ADM 9.2  5.9  103 A.Elbow - B.Elbow 12 54 >=49 22.0  R Peroneal - EDB     Ankle EDB 5.0 <=6.5 2.5 >=2.0 100 Ankle - EDB 9   5.9     Fib head EDB 13.2  1.9  77 Fib head - Ankle 29.5 36 >=44 4.5     Pop fossa EDB 16.2  2.1  108 Pop fossa - Fib head 11 36 >=44 5.2         Pop fossa - Ankle      R Tibial - AH     Ankle AH 4.4 <=5.8 2.2 >=4.0 100 Ankle - AH 9   6.1     Pop fossa AH 17.9  1.2  53.1 Pop fossa - Ankle 44 32 >=41 4.1                 SNC    Nerve / Sites Rec. Site Peak Lat Ref.  Amp Ref. Segments Distance Peak Diff Ref.    ms ms V V  cm ms ms  R Sural - Ankle (Calf) (1)     Calf Ankle NR <=4.4 NR >=6 Calf - Ankle 14    R Radial - Anatomical snuff box (Forearm)     Forearm Wrist 2.5 <=2.9 10 >=15 Forearm - Wrist 10    R Superficial peroneal - Ankle     Lat leg Ankle NR <=4.4 NR >=6 Lat leg - Ankle 14    R Median, Ulnar - Transcarpal comparison     Median Palm Wrist NR <=2.2 NR >=35 Median Palm - Wrist 8          Median Palm - Ulnar Palm  NR <=0.4  L Median, Ulnar - Transcarpal comparison     Median Palm Wrist 2.5 <=2.2 19 >=35 Median Palm - Wrist 8       Ulnar Palm Wrist 2.1 <=2.2 15 >=12 Ulnar Palm - Wrist 8          Median Palm - Ulnar Palm  0.4 <=0.4  L Median - Orthodromic (Dig II, Mid palm)     Dig II Wrist NR <=3.4 NR >=10 Dig II - Wrist 13    R Median - Orthodromic (Dig II, Mid palm)     Dig II Wrist NR <=3.4 NR >=10 Dig II - Wrist 13    R Ulnar - Orthodromic, (Dig V, Mid palm)     Dig V Wrist 3.5 <=3.1 5 >=5 Dig V - Wrist 11    L Ulnar - Orthodromic, (Dig V, Mid palm)     Dig V Wrist 3.1 <=3.1 4 >=5 Dig V - Wrist 11  F  Wave    Nerve F Lat Ref.   ms ms  R Ulnar - ADM 31.8 <=32.0  R Tibial - AH NR <=56.0         EMG Summary Table    Spontaneous MUAP Recruitment  Muscle IA Fib PSW Fasc Other Amp Dur. Poly Pattern  R. Vastus  medialis Normal None None None _______ Normal Normal Normal Normal  R. Tibialis anterior Normal None None None _______ Normal Normal Normal Normal  R. Gastrocnemius (Medial head) Normal None None None _______ Normal Normal Normal Normal  R. Extensor hallucis longus Normal None None None _______ Normal Normal Normal Normal  R. Abductor hallucis Normal None None None _______ Normal Normal Normal Normal  R. Cervical paraspinals (low) Normal None None None _______ Normal Normal Normal Normal  R. Deltoid Normal None None None _______ Normal Normal Normal Normal  R. Triceps brachii Normal None None None _______ Normal Normal Normal Normal  R. Pronator teres Normal None None None _______ Normal Normal Normal Normal  R. Opponens pollicis Normal None 3+ None _______ Increased Increased 3+ Reduced  R. First dorsal interosseous Normal None None None _______ Normal Normal Normal Normal

## 2024-07-01 ENCOUNTER — Telehealth: Payer: Self-pay | Admitting: Neurology

## 2024-07-01 NOTE — Telephone Encounter (Signed)
 Referral for hand surgery fax to [facility]. Phone: [phone], Fax: [phone].

## 2024-07-06 ENCOUNTER — Ambulatory Visit: Attending: Cardiology | Admitting: *Deleted

## 2024-07-06 DIAGNOSIS — I4891 Unspecified atrial fibrillation: Secondary | ICD-10-CM | POA: Insufficient documentation

## 2024-07-06 DIAGNOSIS — Z5181 Encounter for therapeutic drug level monitoring: Secondary | ICD-10-CM | POA: Diagnosis not present

## 2024-07-06 LAB — POCT INR: INR: 2.3 (ref 2.0–3.0)

## 2024-07-06 NOTE — Progress Notes (Signed)
 INR-2.3; Please see anticoagulation encounter

## 2024-07-06 NOTE — Patient Instructions (Signed)
Continue warfarin 1 tablet daily Recheck in 4 wks 

## 2024-07-14 DIAGNOSIS — Z7984 Long term (current) use of oral hypoglycemic drugs: Secondary | ICD-10-CM | POA: Diagnosis not present

## 2024-07-14 DIAGNOSIS — N400 Enlarged prostate without lower urinary tract symptoms: Secondary | ICD-10-CM | POA: Diagnosis not present

## 2024-07-14 DIAGNOSIS — G5603 Carpal tunnel syndrome, bilateral upper limbs: Secondary | ICD-10-CM | POA: Diagnosis not present

## 2024-07-14 DIAGNOSIS — Z8739 Personal history of other diseases of the musculoskeletal system and connective tissue: Secondary | ICD-10-CM | POA: Diagnosis not present

## 2024-07-14 DIAGNOSIS — E119 Type 2 diabetes mellitus without complications: Secondary | ICD-10-CM | POA: Insufficient documentation

## 2024-07-14 DIAGNOSIS — Z7689 Persons encountering health services in other specified circumstances: Secondary | ICD-10-CM | POA: Diagnosis not present

## 2024-07-14 DIAGNOSIS — R42 Dizziness and giddiness: Secondary | ICD-10-CM | POA: Diagnosis not present

## 2024-07-14 DIAGNOSIS — R062 Wheezing: Secondary | ICD-10-CM | POA: Diagnosis not present

## 2024-07-14 DIAGNOSIS — Z8719 Personal history of other diseases of the digestive system: Secondary | ICD-10-CM | POA: Diagnosis not present

## 2024-07-15 ENCOUNTER — Encounter (INDEPENDENT_AMBULATORY_CARE_PROVIDER_SITE_OTHER): Payer: Self-pay

## 2024-07-16 DIAGNOSIS — G8929 Other chronic pain: Secondary | ICD-10-CM | POA: Diagnosis not present

## 2024-07-16 DIAGNOSIS — M1811 Unilateral primary osteoarthritis of first carpometacarpal joint, right hand: Secondary | ICD-10-CM | POA: Diagnosis not present

## 2024-07-16 DIAGNOSIS — G5603 Carpal tunnel syndrome, bilateral upper limbs: Secondary | ICD-10-CM | POA: Diagnosis not present

## 2024-07-16 DIAGNOSIS — G5601 Carpal tunnel syndrome, right upper limb: Secondary | ICD-10-CM | POA: Diagnosis not present

## 2024-07-16 DIAGNOSIS — M79641 Pain in right hand: Secondary | ICD-10-CM | POA: Diagnosis not present

## 2024-07-22 DIAGNOSIS — Q631 Lobulated, fused and horseshoe kidney: Secondary | ICD-10-CM | POA: Diagnosis not present

## 2024-07-22 DIAGNOSIS — R1032 Left lower quadrant pain: Secondary | ICD-10-CM | POA: Diagnosis not present

## 2024-07-22 DIAGNOSIS — N400 Enlarged prostate without lower urinary tract symptoms: Secondary | ICD-10-CM | POA: Diagnosis not present

## 2024-07-22 DIAGNOSIS — N411 Chronic prostatitis: Secondary | ICD-10-CM | POA: Diagnosis not present

## 2024-07-22 DIAGNOSIS — K5730 Diverticulosis of large intestine without perforation or abscess without bleeding: Secondary | ICD-10-CM | POA: Diagnosis not present

## 2024-07-23 DIAGNOSIS — R1032 Left lower quadrant pain: Secondary | ICD-10-CM | POA: Diagnosis not present

## 2024-07-24 DIAGNOSIS — Z7689 Persons encountering health services in other specified circumstances: Secondary | ICD-10-CM | POA: Diagnosis not present

## 2024-07-24 DIAGNOSIS — E1165 Type 2 diabetes mellitus with hyperglycemia: Secondary | ICD-10-CM | POA: Diagnosis not present

## 2024-07-24 DIAGNOSIS — R42 Dizziness and giddiness: Secondary | ICD-10-CM | POA: Diagnosis not present

## 2024-07-24 LAB — HEMOGLOBIN A1C: Hemoglobin A1C: 6.5

## 2024-07-27 ENCOUNTER — Ambulatory Visit: Attending: Cardiology | Admitting: Cardiology

## 2024-07-27 ENCOUNTER — Encounter: Payer: Self-pay | Admitting: Cardiology

## 2024-07-27 VITALS — BP 110/62 | HR 55 | Ht 71.0 in | Wt 215.8 lb

## 2024-07-27 DIAGNOSIS — E782 Mixed hyperlipidemia: Secondary | ICD-10-CM | POA: Insufficient documentation

## 2024-07-27 DIAGNOSIS — I2510 Atherosclerotic heart disease of native coronary artery without angina pectoris: Secondary | ICD-10-CM | POA: Diagnosis not present

## 2024-07-27 DIAGNOSIS — I480 Paroxysmal atrial fibrillation: Secondary | ICD-10-CM | POA: Insufficient documentation

## 2024-07-27 NOTE — Progress Notes (Signed)
 Cardiology Office Note  Date: 07/27/2024   ID: GIAVONNI FONDER, DOB 28-Feb-1941, MRN [id number]  History of Present Illness: [name] is an 83 y.o. male last seen in July by Ms. Strader PA-C, I reviewed her note.  Our last visit was in December 2023.  I reviewed interval records including cardiac catheterization from June and recent ER visit in August.  He is here for a follow-up visit.  He does not report any palpitations or chest discomfort in the interim.  Medications reviewed.  He continues on Coumadin  with follow-up in the anticoagulation clinic, last INR 2.3.  No spontaneous bleeding problems reported.  His blood pressure is well-controlled today.  We also discussed his recent lipid panel, LDL 47.  Physical Exam: VS:  BP 110/62 (BP Location: Left Arm, Cuff Size: Normal)   Pulse (!) 55   Ht 5' 11 (1.803 m)   Wt 215 lb 12.8 oz (97.9 kg)   SpO2 93%   BMI 30.10 kg/m , BMI Body mass index is 30.1 kg/m.  Wt Readings from Last 3 Encounters:  07/27/24 215 lb 12.8 oz (97.9 kg)  06/24/24 212 lb (96.2 kg)  06/17/24 221 lb (100.2 kg)    General: Patient appears comfortable at rest. HEENT: Conjunctiva and lids normal. Neck: Supple, no elevated JVP or carotid bruits. Lungs: Clear to auscultation, nonlabored breathing at rest. Cardiac: Regular rate and rhythm, no S3, 1/6 systolic murmur. Extremities: No pitting edema.  ECG:  An ECG dated 06/24/2024 was personally reviewed today and demonstrated:  Sinus bradycardia with prolonged PR interval and probable PACs.  Labwork: 06/16/2024: ALT 13; AST 17; TSH 4.040 06/24/2024: BUN 15; Creatinine, Ser 1.02; Hemoglobin 12.5; Platelets 120; Potassium 3.6; Sodium 136  September 2025: Cholesterol 100, glycerides 105, HDL 33, LDL 47, hemoglobin 12.4, platelets 188, TSH 3.82, BUN 13, creatinine 1.06, GFR 70, potassium 4.3, AST 15, ALT 12  Other Studies Reviewed Today:  Echocardiogram 04/29/2024:  1. Left ventricular ejection fraction, by  estimation, is 60 to 65%. The  left ventricle has normal function. The left ventricle has no regional  wall motion abnormalities. There is mild concentric left ventricular  hypertrophy. Left ventricular diastolic  parameters are indeterminate.   2. Right ventricular systolic function is normal. The right ventricular  size is normal. Tricuspid regurgitation signal is inadequate for assessing  PA pressure.   3. Left atrial size was upper normal.   4. Mobile interatrial septum without obvious shunt.   5. The mitral valve is grossly normal. No evidence of mitral valve  regurgitation.   6. The aortic valve is tricuspid. There is mild calcification of the  aortic valve. Aortic valve regurgitation is not visualized. Aortic valve  sclerosis is present, with no evidence of aortic valve stenosis.   7. The inferior vena cava is dilated in size with >50% respiratory  variability, suggesting right atrial pressure of 8 mmHg.   Assessment and Plan:  1.  Nonobstructive CAD by cardiac catheterization in June of this year.  LVEF 60 to 65%.  He is not on aspirin  given concurrent use of Coumadin .  Continue Crestor  10 mg daily and observation.  2.  Paroxysmal atrial fibrillation with CHA2DS2-VASc score of 5.  No recurring symptoms, heart rate regular today and he was in sinus rhythm by recent ECG in August.  Not on any AV nodal blockers with bradycardia at baseline.  3.  Primary hypertension.  Blood pressure is well-controlled today.  4.  Mixed hyperlipidemia.  Continue Crestor   10 mg daily.  Recent LDL 47.  Disposition:  Follow up 6 months.  Signed, Jayson JUDITHANN Sierras, M.D., F.A.C.C.  [facility]

## 2024-07-27 NOTE — Patient Instructions (Signed)
 Medication Instructions:  Your physician recommends that you continue on your current medications as directed. Please refer to the Current Medication list given to you today.   Labwork: None today  Testing/Procedures: None today  Follow-Up: 6 months  Any Other Special Instructions Will Be Listed Below (If Applicable).  If you need a refill on your cardiac medications before your next appointment, please call your pharmacy.

## 2024-07-29 DIAGNOSIS — G5603 Carpal tunnel syndrome, bilateral upper limbs: Secondary | ICD-10-CM | POA: Diagnosis not present

## 2024-07-29 DIAGNOSIS — Z125 Encounter for screening for malignant neoplasm of prostate: Secondary | ICD-10-CM | POA: Diagnosis not present

## 2024-07-29 DIAGNOSIS — Z7984 Long term (current) use of oral hypoglycemic drugs: Secondary | ICD-10-CM | POA: Diagnosis not present

## 2024-07-29 DIAGNOSIS — E782 Mixed hyperlipidemia: Secondary | ICD-10-CM | POA: Diagnosis not present

## 2024-07-29 DIAGNOSIS — Z23 Encounter for immunization: Secondary | ICD-10-CM | POA: Diagnosis not present

## 2024-07-29 DIAGNOSIS — N400 Enlarged prostate without lower urinary tract symptoms: Secondary | ICD-10-CM | POA: Diagnosis not present

## 2024-07-29 DIAGNOSIS — E1165 Type 2 diabetes mellitus with hyperglycemia: Secondary | ICD-10-CM | POA: Insufficient documentation

## 2024-07-31 ENCOUNTER — Ambulatory Visit: Admitting: Urology

## 2024-08-03 ENCOUNTER — Ambulatory Visit: Attending: Cardiology | Admitting: *Deleted

## 2024-08-03 DIAGNOSIS — Z5181 Encounter for therapeutic drug level monitoring: Secondary | ICD-10-CM | POA: Diagnosis not present

## 2024-08-03 DIAGNOSIS — I4891 Unspecified atrial fibrillation: Secondary | ICD-10-CM | POA: Insufficient documentation

## 2024-08-03 LAB — POCT INR: INR: 2.4 (ref 2.0–3.0)

## 2024-08-03 NOTE — Progress Notes (Signed)
 INR 2.4 Please see anticoagulation encounter

## 2024-08-03 NOTE — Patient Instructions (Addendum)
 Continue warfarin 1 tablet daily Recheck in 3 wks Pending Carpel Tunnel surgery on 10/14.  Pt is not sure he is going to have it done.

## 2024-08-11 ENCOUNTER — Other Ambulatory Visit: Payer: Self-pay | Admitting: Nurse Practitioner

## 2024-08-24 ENCOUNTER — Ambulatory Visit: Attending: Cardiology | Admitting: *Deleted

## 2024-08-24 DIAGNOSIS — I4891 Unspecified atrial fibrillation: Secondary | ICD-10-CM | POA: Insufficient documentation

## 2024-08-24 DIAGNOSIS — Z5181 Encounter for therapeutic drug level monitoring: Secondary | ICD-10-CM | POA: Insufficient documentation

## 2024-08-24 LAB — POCT INR: INR: 2.7 (ref 2.0–3.0)

## 2024-08-24 NOTE — Patient Instructions (Addendum)
 Continue warfarin 1 tablet daily Recheck in 3 wks Pending Carpel Tunnel surgery on 09/15/24.  Does not have clearance yet re. Holding warfarin.  Will check with Pharmacy to verify and let pt know. Message sent to Dr DELENA Christen surgeon to see if warfarin needs to be held.

## 2024-08-24 NOTE — Progress Notes (Signed)
 INR 2.7; Please see anticoagulation encounter

## 2024-08-26 ENCOUNTER — Telehealth: Payer: Self-pay | Admitting: Pharmacist

## 2024-08-26 NOTE — Telephone Encounter (Signed)
 Called pt.  Ok to hold warfarin 5 days before procedure.  Last dose of warfarin will be 09/09/24.  Resume warfarin night of procedure if ok with MD.  Take 1 1/2 tablets x 2 days then resume 1 tablet daily.  Pt verbalized understanding.

## 2024-08-26 NOTE — Telephone Encounter (Signed)
 Patient with diagnosis of A Fib on warfarin for anticoagulation.    Procedure: carpal tunnel Date of procedure: 09/15/24   CHA2DS2-VASc Score = 5  This indicates a 7.2% annual risk of stroke. The patient's score is based upon: CHF History: 0 HTN History: 1 Diabetes History: 1 Stroke History: 0 Vascular Disease History: 1 Age Score: 2 Gender Score: 0    CrCl 77 ml/min Platelet count 120K  Patient has not had an Afib/aflutter ablation in the last 3 months, DCCV within the last 4 weeks or a watchman implanted in the last 45 days    Per office protocol, patient can hold warfarin for 5 days prior to procedure.    Patient will not need bridging with Lovenox (enoxaparin) around procedure.  **This guidance is not considered finalized until pre-operative APP has relayed final recommendations.**

## 2024-08-27 DIAGNOSIS — N452 Orchitis: Secondary | ICD-10-CM | POA: Diagnosis not present

## 2024-08-27 DIAGNOSIS — N411 Chronic prostatitis: Secondary | ICD-10-CM | POA: Diagnosis not present

## 2024-08-27 DIAGNOSIS — N401 Enlarged prostate with lower urinary tract symptoms: Secondary | ICD-10-CM | POA: Diagnosis not present

## 2024-08-27 DIAGNOSIS — R350 Frequency of micturition: Secondary | ICD-10-CM | POA: Diagnosis not present

## 2024-08-27 DIAGNOSIS — R3915 Urgency of urination: Secondary | ICD-10-CM | POA: Diagnosis not present

## 2024-09-03 NOTE — Telephone Encounter (Signed)
   Pre-operative Risk Assessment    Patient Name: [name]  DOB: February 24, 1941 MRN: [id number]   Date of last office visit: 07/27/24 DR. MCDOWELL Date of next office visit: NONE   Request for Surgical Clearance    Procedure:  RIGHT CARPAL TUNNEL RELEASE  Date of Surgery:  Clearance 09/15/24                                Surgeon: DR. DELENE Surgeon's Group or Practice Name:  [facility] Phone number:  [phone] Fax number:  [phone]   Type of Clearance Requested:   - Pharmacy:  Hold Warfarin (Coumadin ) x 5 DAYS PRIOR; DOES PT NEED LOVENOX BRIDGE; PER FORM IS ASKING FOR PHARM CLEARANCE ONLY   Type of Anesthesia:  Not Indicated (GENERAL? )   Additional requests/questions:    [name]   09/03/2024, 5:12 PM

## 2024-09-04 DIAGNOSIS — R300 Dysuria: Secondary | ICD-10-CM | POA: Diagnosis not present

## 2024-09-04 DIAGNOSIS — R42 Dizziness and giddiness: Secondary | ICD-10-CM | POA: Diagnosis not present

## 2024-09-04 NOTE — Telephone Encounter (Signed)
 Pharmacy please advise on holding Hold Warfarin (Coumadin ) x 5 DAYS  prior to RIGHT CARPAL TUNNEL RELEASE  scheduled for 09/15/2024. Last labs 06/24/2024. Thank you.

## 2024-09-04 NOTE — Telephone Encounter (Signed)
 Patient with diagnosis of atrial fibrillation on warfarin for anticoagulation.    Procedure:  RIGHT CARPAL TUNNEL RELEASE   Date of Surgery:  Clearance 09/15/24    CHA2DS2-VASc Score = 5   This indicates a 7.2% annual risk of stroke. The patient's score is based upon: CHF History: 0 HTN History: 1 Diabetes History: 1 Stroke History: 0 Vascular Disease History: 1 Age Score: 2 Gender Score: 0    CrCl 77 Platelet count 120  Patient has not had an Afib/aflutter ablation in the last 3 months, DCCV within the last 4 weeks or a watchman implanted in the last 45 days   Per office protocol, patient can hold warfarin for 5 days prior to procedure.   Patient will not need bridging with Lovenox (enoxaparin) around procedure.  **This guidance is not considered finalized until pre-operative APP has relayed final recommendations.**

## 2024-09-04 NOTE — Telephone Encounter (Signed)
   Patient Name: [name]  DOB: 05-Apr-1941 MRN: [id number]  Primary Cardiologist: Jayson Sierras, MD  Clinical pharmacists have reviewed the patient's past medical history, labs, and current medications as part of preoperative protocol coverage. The following recommendations have been made:  Procedure:  RIGHT CARPAL TUNNEL RELEASE   Date of Surgery:  Clearance 09/15/24      CHA2DS2-VASc Score = 5   This indicates a 7.2% annual risk of stroke. The patient's score is based upon: CHF History: 0 HTN History: 1 Diabetes History: 1 Stroke History: 0 Vascular Disease History: 1 Age Score: 2 Gender Score: 0     CrCl 77 Platelet count 120   Patient has not had an Afib/aflutter ablation in the last 3 months, DCCV within the last 4 weeks or a watchman implanted in the last 45 days    Per office protocol, patient can hold warfarin for 5 days prior to procedure.   Patient will not need bridging with Lovenox (enoxaparin) around procedure.         I will route this recommendation to the requesting party via Epic fax function and remove from pre-op pool.  Please call with questions.  Lamarr Satterfield, NP 09/04/2024, 2:57 PM

## 2024-09-14 ENCOUNTER — Telehealth: Payer: Self-pay | Admitting: Cardiology

## 2024-09-14 NOTE — Telephone Encounter (Signed)
 Pt calling to let Olam Bathe know he rescheduled his hand surgery and is continuing his medication as prescribed.

## 2024-09-21 ENCOUNTER — Ambulatory Visit (INDEPENDENT_AMBULATORY_CARE_PROVIDER_SITE_OTHER): Admitting: Nurse Practitioner

## 2024-09-21 ENCOUNTER — Encounter: Payer: Self-pay | Admitting: Nurse Practitioner

## 2024-09-21 VITALS — BP 126/80 | HR 74 | Ht 71.0 in | Wt 212.0 lb

## 2024-09-21 DIAGNOSIS — E559 Vitamin D deficiency, unspecified: Secondary | ICD-10-CM | POA: Diagnosis not present

## 2024-09-21 DIAGNOSIS — Z7984 Long term (current) use of oral hypoglycemic drugs: Secondary | ICD-10-CM

## 2024-09-21 DIAGNOSIS — G6289 Other specified polyneuropathies: Secondary | ICD-10-CM

## 2024-09-21 DIAGNOSIS — E1165 Type 2 diabetes mellitus with hyperglycemia: Secondary | ICD-10-CM

## 2024-09-21 DIAGNOSIS — R5383 Other fatigue: Secondary | ICD-10-CM | POA: Diagnosis not present

## 2024-09-21 MED ORDER — GLIPIZIDE ER 5 MG PO TB24: 5.0000 mg | ORAL_TABLET | Freq: Every day | ORAL | 3 refills | Status: AC

## 2024-09-21 MED ORDER — METFORMIN HCL ER 500 MG PO TB24: 1000.0000 mg | ORAL_TABLET | Freq: Every day | ORAL | 3 refills | Status: AC

## 2024-09-21 NOTE — Progress Notes (Signed)
 Endocrinology Follow Up Visit      09/21/2024, 3:16 PM      Subjective:    Patient ID: [name], male    DOB: 07-05-1941.  [name] is being seen in follow up after being seen in consultation for management of currently uncontrolled symptomatic diabetes requested by  Shona Norleen PEDLAR, MD.  He is a retired emergency planning/management officer for [employer].   Past Medical History:  Diagnosis Date   Aortic atherosclerosis    Barrett's esophagus    Short-segment, diagnosed in 2007 by Dr. Shaaron   BPH (benign prostatic hyperplasia)    CAD (coronary artery disease)    a. cath on 07/21/2019 showing patent LM and LCx with 40-60% Proximal LAD and 30-40% Pro-RCA stenosis with normal LV function.   Essential hypertension    GERD (gastroesophageal reflux disease)    Hiatal hernia    History of kidney stones    PAF (paroxysmal atrial fibrillation) (HCC)    S/P colonoscopy 2007   S/P endoscopy 08/2010    Salmon-colored epithelium coming up to 37 cm from the    Type 2 diabetes mellitus (HCC)    Vertigo     Past Surgical History:  Procedure Laterality Date   APPENDECTOMY     BIOPSY  01/28/2020   Procedure: BIOPSY;  Surgeon: Shaaron Lamar HERO, MD;  Location: [facility];  Service: Endoscopy;;  esophagus   CHOLECYSTECTOMY     COLONOSCOPY  02/06/2006   Dr. Shaaron- single anal papilla, o/w normal rectum, normal colon   COLONOSCOPY N/A 09/13/2016   RMR: three 5-6 mm for adenomas removed.  Recommend one last surveillance colonoscopy in October 2020.   COLONOSCOPY WITH PROPOFOL  N/A 01/28/2020   Procedure: COLONOSCOPY WITH PROPOFOL ;  Surgeon: Shaaron Lamar HERO, MD;  Location: [facility];  Service: Endoscopy;  Laterality: N/A;  10:45am   CORONARY PRESSURE/FFR STUDY N/A 07/21/2019   Procedure: INTRAVASCULAR PRESSURE WIRE/FFR STUDY;  Surgeon: Claudene Victory ORN, MD;  Location: [facility];  Service: Cardiovascular;   Laterality: N/A;   CYSTOSCOPY WITH RETROGRADE PYELOGRAM, URETEROSCOPY AND STENT PLACEMENT Right 08/18/2020   Procedure: CYSTOSCOPY WITH INCISION OF RIGHT URETEROCELE, URETEROSCOPY WITH HOLMIUM LASER, CYSTOLITHOLAPAXY OF 10 MM BLADDER STONE;  Surgeon: Watt Norleen, MD;  Location: [facility];  Service: Urology;  Laterality: Right;   ESOPHAGOGASTRODUODENOSCOPY  09/15/2010   Dr. Shaaron- Salmon-colored epithelium coming up to 37 cm from the    ESOPHAGOGASTRODUODENOSCOPY N/A 07/02/2013   Dr. Shaaron- Barretts on bx. hiatal hernia   ESOPHAGOGASTRODUODENOSCOPY N/A 09/13/2016   RMR: Barrett's esophagus without dysplasia. one last EGD in 3 years.    ESOPHAGOGASTRODUODENOSCOPY (EGD) WITH PROPOFOL  N/A 01/28/2020   Procedure: ESOPHAGOGASTRODUODENOSCOPY (EGD) WITH PROPOFOL ;  Surgeon: Shaaron Lamar HERO, MD;  [facility];  Service: Endoscopy;  Laterality: N/A;   LEFT HEART CATH AND CORONARY ANGIOGRAPHY N/A 07/21/2019   Procedure: LEFT HEART CATH AND CORONARY ANGIOGRAPHY;  Surgeon: Claudene Victory ORN, MD;  Location: [facility];  Service: Cardiovascular;  Laterality: N/A;   POLYPECTOMY  01/28/2020   Procedure: POLYPECTOMY;  Surgeon: Shaaron Lamar HERO, MD;  Location: [facility];  Service: Endoscopy;;  colon   RIGHT/LEFT HEART CATH AND CORONARY ANGIOGRAPHY N/A 05/15/2024   Procedure: RIGHT/LEFT HEART CATH AND CORONARY ANGIOGRAPHY;  Surgeon: Anner Alm ORN, MD;  Location: [facility];  Service: Cardiovascular;  Laterality: N/A;   ROTATOR CUFF REPAIR     YAG LASER APPLICATION Right 12/20/2014   Procedure: YAG LASER APPLICATION;  Surgeon: Dow JULIANNA Burke, MD;  Location: [facility];  Service: Ophthalmology;  Laterality: Right;    Social History   Socioeconomic History   Marital status: Married    Spouse name: Not on file   Number of children: Not on file   Years of education: Not on file   Highest education level: Not on file  Occupational History   Not on file  Tobacco Use   Smoking status: Former     Current packs/day: 0.00    Average packs/day: 1.5 packs/day for 30.0 years (45.0 ttl pk-yrs)    Types: Cigarettes    Start date: 11/19/1963    Quit date: 11/18/1993    Years since quitting: 30.8   Smokeless tobacco: Never   Tobacco comments:    quit in the 90's  Vaping Use   Vaping status: Never Used  Substance and Sexual Activity   Alcohol use: No   Drug use: No   Sexual activity: Not on file  Other Topics Concern   Not on file  Social History Narrative   Pt lives at home alone.   Social Drivers of Corporate Investment Banker Strain: Low Risk  (08/01/2023)   Overall Financial Resource Strain (CARDIA)    Difficulty of Paying Living Expenses: Not very hard  Food Insecurity: Low Risk  (07/16/2024)   Received from [facility]   Hunger Vital Sign    Within the past 12 months, you worried that your food would run out before you got money to buy more: Never true    Within the past 12 months, the food you bought just didn't last and you didn't have money to get more. : Never true  Transportation Needs: No Transportation Needs (07/16/2024)   Received from [facility]    In the past 12 months, has lack of reliable transportation kept you from medical appointments, meetings, work or from getting things needed for daily living? : No  Physical Activity: Inactive (07/19/2023)   Exercise Vital Sign    Days of Exercise per Week: 0 days    Minutes of Exercise per Session: 0 min  Stress: Not on file  Social Connections: Not on file    Family History  Problem Relation Age of Onset   Prostate cancer Father        deceased   Stroke Mother        deceased   Colon cancer Neg Hx     Outpatient Encounter Medications as of 09/21/2024  Medication Sig   albuterol  (VENTOLIN  HFA) 108 (90 Base) MCG/ACT inhaler Inhale 1-2 puffs into the lungs every 6 (six) hours as needed for shortness of breath or wheezing.   allopurinol (ZYLOPRIM) 100 MG tablet Take 100 mg by mouth daily.     ALPRAZolam  (XANAX ) 1 MG tablet Take 1 mg by mouth at bedtime as needed for anxiety or sleep.   ascorbic acid (VITAMIN C) 500 MG tablet Take 500 mg by mouth daily.   Cholecalciferol (VITAMIN D3) 50 MCG (2000 UT) TABS Take 2,000 Units by mouth daily.   Continuous Glucose Sensor (DEXCOM G7 SENSOR) MISC  by Does not apply route.   DULoxetine (CYMBALTA) 60 MG capsule Take 60 mg by mouth daily.   escitalopram  (LEXAPRO ) 10 MG tablet Take 10 mg by mouth daily.   fexofenadine (ALLEGRA) 180 MG tablet Take 180 mg by mouth daily.   finasteride  (PROSCAR ) 5 MG tablet Take 1 tablet (5 mg total) by mouth daily.   furosemide  (LASIX ) 20 MG tablet Take 1 tablet (20 mg total) by mouth daily.   gabapentin (NEURONTIN) 300 MG capsule Take 600 mg by mouth at bedtime.   NONFORMULARY OR COMPOUNDED ITEM Superquadmix with Prostin  (prostaglandin) 40 mcg/ml. Papaverine 30mg /ml, Regitine  4mg  and Atropine400mcg/ml.  Dispense 5 ml total. Inject 1 ml into penis prior to sex as directed, no more than once every other day. Alternate injection sites (right versus left).   pantoprazole  (PROTONIX ) 40 MG tablet TAKE ONE TABLET (40MG  TOTAL) BY MOUTH TWO TIMES DAILY BEFORE A MEAL   rosuvastatin  (CRESTOR ) 10 MG tablet TAKE ONE TABLET (10MG  TOTAL) BY MOUTH DAILY   sildenafil (VIAGRA) 50 MG tablet Take 50-100 mg by mouth daily as needed.   tamsulosin  (FLOMAX ) 0.4 MG CAPS capsule Take 2 capsules (0.8 mg total) by mouth daily.   traMADol  (ULTRAM ) 50 MG tablet Take 1 tablet (50 mg total) by mouth every 6 (six) hours as needed for moderate pain (pain.).   [DISCONTINUED] glipiZIDE  (GLUCOTROL  XL) 5 MG 24 hr tablet TAKE ONE TABLET (5MG  TOTAL) BY MOUTH DAILY WITH BREAKFAST   [DISCONTINUED] metFORMIN  (GLUCOPHAGE -XR) 500 MG 24 hr tablet Take 2 tablets (1,000 mg total) by mouth daily with breakfast.   glipiZIDE  (GLUCOTROL  XL) 5 MG 24 hr tablet Take 1 tablet (5 mg total) by mouth daily with breakfast.   metFORMIN  (GLUCOPHAGE -XR) 500 MG 24 hr tablet Take  2 tablets (1,000 mg total) by mouth daily with breakfast.   warfarin (COUMADIN ) 5 MG tablet TAKE ONE TABLET BY MOUTH DAILY EXCEPT TAKE ONE AND ONE-HALF TABLETS ON TUESDAYS AND FRIDAYS OR AS DIRECTED   No facility-administered encounter medications on file as of 09/21/2024.    ALLERGIES: Allergies  Allergen Reactions   Prednisone     Patient state that this medication made his blood sugar go real high    VACCINATION STATUS: Immunization History  Administered Date(s) Administered   Influenza-Unspecified 09/19/2013, 08/19/2022    Diabetes He presents for his follow-up diabetic visit. He has type 2 diabetes mellitus. Onset time: diagnosed at approximate age of 63. His disease course has been improving. There are no hypoglycemic associated symptoms. Pertinent negatives for hypoglycemia include no confusion, headaches, seizures or tremors. Associated symptoms include fatigue and foot paresthesias. Pertinent negatives for diabetes include no chest pain, no foot ulcerations, no polydipsia, no polyuria, no visual change and no weight loss. There are no hypoglycemic complications. Symptoms are stable. Diabetic complications include heart disease, impotence and peripheral neuropathy. Risk factors for coronary artery disease include diabetes mellitus, dyslipidemia, hypertension, male sex, obesity and sedentary lifestyle. Current diabetic treatment includes oral agent (dual therapy). He is compliant with treatment most of the time. His weight is fluctuating minimally. He is following a generally unhealthy diet. When asked about meal planning, he reported none. He has not had a previous visit with a dietitian. He rarely participates in exercise. His home blood glucose trend is decreasing steadily. His overall blood glucose range is 140-180 mg/dl. (He presents today with his CGM showing mostly at goal glycemic profile overall.  His most recent A1c checked by PCP on 9/5 was 6.5%, improving from last visit  of  7.2%.  Analysis of his CGM shows TIR 64%, TAR 36%, TBR 0% with a GMI of 7.4%.  He has been still dealing with neuropathy but overall is doing much better.  He does continue to eat cereal which causes high spike in glucose.) An ACE inhibitor/angiotensin II receptor blocker is not being taken. He does not see a podiatrist.Eye exam is current.    Review of systems  Constitutional: + Minimally fluctuating body weight,  current Body mass index is 29.57 kg/m. , + fatigue, no subjective hyperthermia, no subjective hypothermia, + daytime sleepiness Eyes: no blurry vision, no xerophthalmia ENT: no sore throat, no nodules palpated in throat, no dysphagia/odynophagia, no hoarseness Cardiovascular: no chest pain, no shortness of breath, no palpitations, no leg swelling Respiratory: no cough, no shortness of breath Gastrointestinal: no nausea/vomiting/diarrhea Musculoskeletal: + chronic intermittent back pain, unsteady on feet (has fallen several times) Skin: no rashes, no hyperemia Neurological: no tremors, + numbness/tingling to BLE and hands- cannot feel feet most days, + intermittent dizziness Psychiatric: no depression, no anxiety   Objective:    BP 126/80 (BP Location: Left Arm, Patient Position: Sitting, Cuff Size: Large)   Pulse 74   Ht 5' 11 (1.803 m)   Wt 212 lb (96.2 kg)   BMI 29.57 kg/m   Wt Readings from Last 3 Encounters:  09/21/24 212 lb (96.2 kg)  07/27/24 215 lb 12.8 oz (97.9 kg)  06/24/24 212 lb (96.2 kg)    BP Readings from Last 3 Encounters:  09/21/24 126/80  07/27/24 110/62  06/25/24 104/64     Physical Exam- Limited  Constitutional:  Body mass index is 29.57 kg/m. , not in acute distress, normal state of mind Eyes:  EOMI, no exophthalmos Musculoskeletal: no gross deformities, strength intact in all four extremities, no gross restriction of joint movements Skin:  no rashes, no hyperemia Neurological: no tremor with outstretched hands   Diabetic Foot Exam -  Simple   No data filed     CMP ( most recent) CMP     Component Value Date/Time   NA 136 06/24/2024 1121   NA 142 06/16/2024 0835   K 3.6 06/24/2024 1121   CL 103 06/24/2024 1121   CO2 24 06/24/2024 1121   GLUCOSE 143 (H) 06/24/2024 1121   BUN 15 06/24/2024 1121   BUN 16 06/16/2024 0835   CREATININE 1.02 06/24/2024 1121   CREATININE 1.16 07/03/2019 1343   CALCIUM  8.6 (L) 06/24/2024 1121   PROT 5.8 (L) 06/16/2024 0835   ALBUMIN 4.1 06/16/2024 0835   AST 17 06/16/2024 0835   ALT 13 06/16/2024 0835   ALKPHOS 57 06/16/2024 0835   BILITOT 0.4 06/16/2024 0835   GFRNONAA >60 06/24/2024 1121   GFRAA >60 08/12/2020 1336     Diabetic Labs (most recent): Lab Results  Component Value Date   HGBA1C 7.2 (A) 06/17/2024   HGBA1C 6.9 (A) 02/12/2024   HGBA1C 7.0 (A) 10/14/2023   MICROALBUR 10 mg/L 06/10/2023   MICROALBUR 10 02/21/2022   MICROALBUR 30 02/23/2021     Lipid Panel ( most recent) Lipid Panel  No results found for: CHOL, TRIG, HDL, CHOLHDL, VLDL, LDLCALC, LDLDIRECT, LABVLDL    Lab Results  Component Value Date   TSH 4.040 06/16/2024   TSH 2.420 05/25/2022   TSH 3.235 05/02/2018   FREET4 1.12 06/16/2024   FREET4 1.10 05/25/2022         Assessment & Plan:   1) Type 2 diabetes mellitus with hyperglycemia, without long-term  current use of insulin  [facility])  He presents today with his CGM showing mostly at goal glycemic profile overall.  His most recent A1c checked by PCP on 9/5 was 6.5%, improving from last visit of 7.2%.  Analysis of his CGM shows TIR 64%, TAR 36%, TBR 0% with a GMI of 7.4%.  He has been still dealing with neuropathy but overall is doing much better.  He does continue to eat cereal which causes high spike in glucose.  - [name] has currently uncontrolled symptomatic type 2 DM since 83 years of age.   Recent labs reviewed.  - I had a long discussion with him about the progressive nature of diabetes and the pathology behind  its complications.  -his diabetes is complicated by CAD, peripheral neuropathy and he remains at a high risk for more acute and chronic complications which include CAD, CVA, CKD, retinopathy, and neuropathy. These are all discussed in detail with him.  - Nutritional counseling repeated/built upon at each appointment.  - The patient admits there is a room for improvement in their diet and drink choices. -  Suggestion is made for the patient to avoid simple carbohydrates from their diet including Cakes, Sweet Desserts / Pastries, Ice Cream, Soda (diet and regular), Sweet Tea, Candies, Chips, Cookies, Sweet Pastries, Store Bought Juices, Alcohol in Excess of 1-2 drinks a day, Artificial Sweeteners, Coffee Creamer, and Sugar-free Products. This will help patient to have stable blood glucose profile and potentially avoid unintended weight gain.   - I encouraged the patient to switch to unprocessed or minimally processed complex starch and increased protein intake (animal or plant source), fruits, and vegetables.   - Patient is advised to stick to a routine mealtimes to eat 3 meals a day and avoid unnecessary snacks (to snack only to correct hypoglycemia).  - I have approached him with the following individualized plan to manage  his diabetes and patient agrees:   -He is advised to continue Metformin  1000 mg ER daily and Glipizide  5 mg ER po daily.    -He did qualify for PAP for Rybelsus , unfortunately he did not tolerate that medication due to vertigo.  He prefers to stay away from injectables at this time.  -He is encouraged to monitor blood glucose at least twice daily (using his CGM), before breakfast and before bed, and to notify the clinic if his glucose readings are less than 70 or above 300 for 3 tests in a row.  He has benefited from CGM device, is advised to continue wearing it.  - Specific targets for  A1c;  LDL, HDL,  and Triglycerides were discussed with the patient.  2) Blood  Pressure /Hypertension:  His blood pressure is controlled to target without the use of antihypertensive medications.  3) Lipids/Hyperlipidemia:  Recent lipid panel from 07/27/24 shows controlled LDL of 47.  He is advised to continue Crestor  10 mg po daily.   4)  Weight/Diet:  His Body mass index is 29.57 kg/m.  - complicating his diabetes care.   he is a candidate for some weight loss. I discussed with him the fact that loss of 5 - 10% of his  current body weight will have the most impact on his diabetes management.  Exercise, and detailed carbohydrates information provided  -  detailed on discharge instructions.  5) Chronic Care/Health Maintenance: -he is encouraged to initiate and continue to follow up with Ophthalmology, Dentist,  Podiatrist at least yearly or according to recommendations, and advised to stay  away from smoking and/or secondhand smoke exposure. I have recommended yearly flu vaccine and pneumonia vaccine at least every 5 years; moderate intensity exercise for up to 150 minutes weekly; and  sleep for at least 7 hours a day.    - he is advised to maintain close follow up with Shona Norleen PEDLAR, MD for primary care needs, as well as his other providers for optimal and coordinated care.     I spent  41  minutes in the care of the patient today including review of labs from CMP, Lipids, Thyroid  Function, Hematology (current and previous including abstractions from other facilities); face-to-face time discussing  his blood glucose readings/logs, discussing hypoglycemia and hyperglycemia episodes and symptoms, medications doses, his options of short and long term treatment based on the latest standards of care / guidelines;  discussion about incorporating lifestyle medicine;  and documenting the encounter. Risk reduction counseling performed per USPSTF guidelines to reduce obesity and cardiovascular risk factors.     Please refer to Patient Instructions for Blood Glucose Monitoring and  Insulin /Medications Dosing Guide  in media tab for additional information. Please  also refer to  Patient Self Inventory in the Media  tab for reviewed elements of pertinent patient history.  Vaughan JONELLE Rigg participated in the discussions, expressed understanding, and voiced agreement with the above plans.  All questions were answered to his satisfaction. he is encouraged to contact clinic should he have any questions or concerns prior to his return visit.    Follow up plan: - Return in about 4 months (around 01/19/2025) for Diabetes F/U with A1c in office, No previsit labs, Bring meter and logs.  Benton Rio, [facility] [address] [place], [place] [address] Phone: [phone] Fax: [phone]  09/21/2024, 3:16 PM

## 2024-09-23 ENCOUNTER — Ambulatory Visit: Attending: Cardiology | Admitting: *Deleted

## 2024-09-23 DIAGNOSIS — Z23 Encounter for immunization: Secondary | ICD-10-CM | POA: Diagnosis not present

## 2024-09-23 DIAGNOSIS — I4891 Unspecified atrial fibrillation: Secondary | ICD-10-CM | POA: Diagnosis not present

## 2024-09-23 DIAGNOSIS — Z5181 Encounter for therapeutic drug level monitoring: Secondary | ICD-10-CM | POA: Diagnosis not present

## 2024-09-23 LAB — POCT INR: INR: 2.2 (ref 2.0–3.0)

## 2024-09-23 NOTE — Progress Notes (Signed)
 INR 2.2; Please see anticoagulation encounter

## 2024-09-23 NOTE — Patient Instructions (Signed)
 Continue warfarin 1 tablet daily Recheck in 4 wks Pending Carpel Tunnel surgery on 12/01/24.  Ok to hold warfarin 5 days before.  Does not need Lovenox.

## 2024-10-21 ENCOUNTER — Ambulatory Visit

## 2024-10-26 ENCOUNTER — Ambulatory Visit

## 2024-10-28 ENCOUNTER — Ambulatory Visit: Attending: Cardiology

## 2024-10-28 DIAGNOSIS — I4891 Unspecified atrial fibrillation: Secondary | ICD-10-CM | POA: Insufficient documentation

## 2024-10-28 DIAGNOSIS — Z5181 Encounter for therapeutic drug level monitoring: Secondary | ICD-10-CM | POA: Diagnosis not present

## 2024-10-28 LAB — POCT INR: INR: 1.7 — AB (ref 2.0–3.0)

## 2024-10-28 NOTE — Progress Notes (Signed)
 INR 1.7; Please see anticoagulation encounter

## 2024-10-28 NOTE — Patient Instructions (Signed)
 Take warfarin 1 1/2 tablets tonight and tomorrow night then resume 1 tablet daily Recheck in 3 wks Pending Carpel Tunnel surgery on 12/01/24.  Ok to hold warfarin 5 days before.  Does not need Lovenox.

## 2024-11-20 ENCOUNTER — Other Ambulatory Visit: Payer: Self-pay | Admitting: Cardiology

## 2024-11-23 ENCOUNTER — Encounter (INDEPENDENT_AMBULATORY_CARE_PROVIDER_SITE_OTHER): Payer: Self-pay | Admitting: Otolaryngology

## 2024-11-23 ENCOUNTER — Ambulatory Visit (INDEPENDENT_AMBULATORY_CARE_PROVIDER_SITE_OTHER): Admitting: Otolaryngology

## 2024-11-23 VITALS — BP 120/74 | HR 62 | Ht 71.0 in | Wt 220.0 lb

## 2024-11-23 DIAGNOSIS — H903 Sensorineural hearing loss, bilateral: Secondary | ICD-10-CM | POA: Insufficient documentation

## 2024-11-23 DIAGNOSIS — R040 Epistaxis: Secondary | ICD-10-CM | POA: Diagnosis not present

## 2024-11-23 DIAGNOSIS — R42 Dizziness and giddiness: Secondary | ICD-10-CM

## 2024-11-23 NOTE — Progress Notes (Signed)
 CC: Recurrent dizziness, bilateral hearing loss, recurrent left epistaxis  Discussed the use of AI scribe software for clinical note transcription with the patient, who gave verbal consent to proceed.  History of Present Illness [name] is an 84 year old male with bilateral sensorineural hearing loss who presents with recurrent dizziness and recurrent left-sided epistaxis.  For several months, he has experienced recurrent episodes of scabbing and bleeding localized to the left nasal cavity. Bleeding is typically precipitated by removal of the scab or digital manipulation. He denies nasal congestion or obstructive symptoms. He applies mentholated Vaseline nightly to the nasal mucosa.  He describes chronic dizziness and impaired balance characterized by unsteadiness and a tendency to lose balance with bending, rapid movements, or descending stairs. He denies significant spinning vertigo, spinning sensation, headaches, or auditory changes during episodes. He has experienced intermittent falls, most recently sustaining a back contusion after falling onto a stool, but has not sustained fractures. Episodes occur intermittently, sometimes weekly, and are triggered by specific movements.  He reports hearing loss in both ears, which he perceives as gradually progressive. He does not use hearing aids. He experiences occasional tinnitus, less frequent than previously, and denies aural fullness. He hears well in quiet environments but has difficulty in noisy settings, and his son has observed that he plays the television at a high volume.  He reports persistent postnasal drainage and currently uses guaifenesin  to facilitate mucus clearance.   Past Medical History:  Diagnosis Date   Aortic atherosclerosis    Barrett's esophagus    Short-segment, diagnosed in 2007 by Dr. Shaaron   BPH (benign prostatic hyperplasia)    CAD (coronary artery disease)    a. cath on 07/21/2019 showing patent LM and LCx with  40-60% Proximal LAD and 30-40% Pro-RCA stenosis with normal LV function.   Essential hypertension    GERD (gastroesophageal reflux disease)    Hiatal hernia    History of kidney stones    PAF (paroxysmal atrial fibrillation) (HCC)    S/P colonoscopy 2007   S/P endoscopy 08/2010    Salmon-colored epithelium coming up to 37 cm from the    Type 2 diabetes mellitus (HCC)    Vertigo     Past Surgical History:  Procedure Laterality Date   APPENDECTOMY     BIOPSY  01/28/2020   Procedure: BIOPSY;  Surgeon: Shaaron Lamar HERO, MD;  Location: [facility];  Service: Endoscopy;;  esophagus   CHOLECYSTECTOMY     COLONOSCOPY  02/06/2006   Dr. Shaaron- single anal papilla, o/w normal rectum, normal colon   COLONOSCOPY N/A 09/13/2016   RMR: three 5-6 mm for adenomas removed.  Recommend one last surveillance colonoscopy in October 2020.   COLONOSCOPY WITH PROPOFOL  N/A 01/28/2020   Procedure: COLONOSCOPY WITH PROPOFOL ;  Surgeon: Shaaron Lamar HERO, MD;  Location: [facility];  Service: Endoscopy;  Laterality: N/A;  10:45am   CORONARY PRESSURE/FFR STUDY N/A 07/21/2019   Procedure: INTRAVASCULAR PRESSURE WIRE/FFR STUDY;  Surgeon: Claudene Victory ORN, MD;  Location: [facility];  Service: Cardiovascular;  Laterality: N/A;   CYSTOSCOPY WITH RETROGRADE PYELOGRAM, URETEROSCOPY AND STENT PLACEMENT Right 08/18/2020   Procedure: CYSTOSCOPY WITH INCISION OF RIGHT URETEROCELE, URETEROSCOPY WITH HOLMIUM LASER, CYSTOLITHOLAPAXY OF 10 MM BLADDER STONE;  Surgeon: Watt Rush, MD;  Location: [facility];  Service: Urology;  Laterality: Right;   ESOPHAGOGASTRODUODENOSCOPY  09/15/2010   Dr. Shaaron- Salmon-colored epithelium coming up to 37 cm from the    ESOPHAGOGASTRODUODENOSCOPY N/A 07/02/2013   Dr.  Rourk- Barretts on bx. hiatal hernia   ESOPHAGOGASTRODUODENOSCOPY N/A 09/13/2016   RMR: Barrett's esophagus without dysplasia. one last EGD in 3 years.    ESOPHAGOGASTRODUODENOSCOPY (EGD) WITH PROPOFOL  N/A 01/28/2020   Procedure:  ESOPHAGOGASTRODUODENOSCOPY (EGD) WITH PROPOFOL ;  Surgeon: Shaaron Lamar HERO, MD;  Location: [facility];  Service: Endoscopy;  Laterality: N/A;   LEFT HEART CATH AND CORONARY ANGIOGRAPHY N/A 07/21/2019   Procedure: LEFT HEART CATH AND CORONARY ANGIOGRAPHY;  Surgeon: Claudene Victory ORN, MD;  Location: [facility];  Service: Cardiovascular;  Laterality: N/A;   POLYPECTOMY  01/28/2020   Procedure: POLYPECTOMY;  Surgeon: Shaaron Lamar HERO, MD;  Location: [facility];  Service: Endoscopy;;  colon   RIGHT/LEFT HEART CATH AND CORONARY ANGIOGRAPHY N/A 05/15/2024   Procedure: RIGHT/LEFT HEART CATH AND CORONARY ANGIOGRAPHY;  Surgeon: Anner Alm ORN, MD;  [facility];  Service: Cardiovascular;  Laterality: N/A;   ROTATOR CUFF REPAIR     YAG LASER APPLICATION Right 12/20/2014   Procedure: YAG LASER APPLICATION;  Surgeon: Dow JULIANNA Burke, MD;  Location: [facility];  Service: Ophthalmology;  Laterality: Right;    Family History  Problem Relation Age of Onset   Prostate cancer Father        deceased   Stroke Mother        deceased   Colon cancer Neg Hx     Social History:  reports that he quit smoking about 31 years ago. His smoking use included cigarettes. He started smoking about 61 years ago. He has a 45 pack-year smoking history. He has never used smokeless tobacco. He reports that he does not drink alcohol and does not use drugs.  Allergies: Allergies[1]  Prior to Admission medications  Medication Sig Start Date End Date Taking? Authorizing Provider  albuterol  (VENTOLIN  HFA) 108 (90 Base) MCG/ACT inhaler Inhale 1-2 puffs into the lungs every 6 (six) hours as needed for shortness of breath or wheezing. 12/23/23  Yes [provider]  allopurinol (ZYLOPRIM) 100 MG tablet Take 100 mg by mouth daily.  02/12/19  Yes [provider]  ALPRAZolam  (XANAX ) 1 MG tablet Take 1 mg by mouth at bedtime as needed for anxiety or sleep.   Yes [provider]  ascorbic acid  (VITAMIN C) 500 MG tablet Take 500 mg by mouth daily.   Yes [provider]  Cholecalciferol (VITAMIN D3) 50 MCG (2000 UT) TABS Take 2,000 Units by mouth daily.   Yes [provider]  Continuous Glucose Sensor (DEXCOM G7 SENSOR) MISC by Does not apply route.   Yes [provider]  DULoxetine (CYMBALTA) 60 MG capsule Take 60 mg by mouth daily. 10/20/20  Yes [provider]  escitalopram  (LEXAPRO ) 10 MG tablet Take 10 mg by mouth daily. 06/23/24  Yes [provider]  fexofenadine (ALLEGRA) 180 MG tablet Take 180 mg by mouth daily.   Yes [provider]  finasteride  (PROSCAR ) 5 MG tablet Take 1 tablet (5 mg total) by mouth daily. 05/14/24  Yes Watt Rush, MD  furosemide  (LASIX ) 20 MG tablet Take 1 tablet (20 mg total) by mouth daily. 06/09/24  Yes Strader, Brittany M, PA-C  gabapentin (NEURONTIN) 300 MG capsule Take 600 mg by mouth at bedtime.   Yes [provider]  glipiZIDE  (GLUCOTROL  XL) 5 MG 24 hr tablet Take 1 tablet (5 mg total) by mouth daily with breakfast. 09/21/24  Yes Therisa Benton PARAS, NP  metFORMIN  (GLUCOPHAGE -XR) 500 MG 24 hr tablet Take 2 tablets (1,000  mg total) by mouth daily with breakfast. 09/21/24  Yes Therisa Benton PARAS, NP  NONFORMULARY OR COMPOUNDED ITEM Superquadmix with Prostin  (prostaglandin) 40 mcg/ml. Papaverine 30mg /ml, Regitine  4mg  and Atropine400mcg/ml.  Dispense 5 ml total. Inject 1 ml into penis prior to sex as directed, no more than once every other day. Alternate injection sites (right versus left). 05/14/24  Yes Watt Rush, MD  pantoprazole  (PROTONIX ) 40 MG tablet TAKE ONE TABLET (40MG  TOTAL) BY MOUTH TWO TIMES DAILY BEFORE A MEAL 06/15/24  Yes Ezzard Sonny RAMAN, PA-C  rosuvastatin  (CRESTOR ) 10 MG tablet TAKE ONE TABLET (10MG  TOTAL) BY MOUTH DAILY 05/08/24  Yes Debera Jayson MATSU, MD  sildenafil (VIAGRA) 50 MG tablet Take 50-100 mg by mouth daily as needed. 05/30/24  Yes [provider]  tamsulosin  (FLOMAX )  0.4 MG CAPS capsule Take 2 capsules (0.8 mg total) by mouth daily. 05/14/24  Yes Watt Rush, MD  traMADol  (ULTRAM ) 50 MG tablet Take 1 tablet (50 mg total) by mouth every 6 (six) hours as needed for moderate pain (pain.). 08/18/20  Yes Watt Rush, MD  warfarin (COUMADIN ) 5 MG tablet TAKE ONE TABLET BY MOUTH DAILY OR AS DIRECTED BY COUMADIN  CLINIC 11/20/24  Yes Debera Jayson MATSU, MD    Blood pressure 120/74, pulse 62, height 5' 11 (1.803 m), weight 220 lb (99.8 kg), SpO2 95%. Exam: General: Communicates without difficulty, well nourished, no acute distress. Head: Normocephalic, no evidence injury, no tenderness, facial buttresses intact without stepoff. Face/sinus: No tenderness to palpation and percussion. Facial movement is normal and symmetric. Eyes: PERRL, EOMI. No scleral icterus, conjunctivae clear. Neuro: CN II exam reveals vision grossly intact.  No nystagmus at any point of gaze. Ears: Auricles well formed without lesions.  Ear canals are intact without mass or lesion.  No erythema or edema is appreciated.  The TMs are intact without fluid. Nose: External evaluation reveals normal support and skin without lesions.  Dorsum is intact.  Anterior rhinoscopy reveals hypervascular areas on the left nasal septum.  Oral:  Oral cavity and oropharynx are intact, symmetric, without erythema or edema.  Mucosa is moist without lesions. Neck: Full range of motion without pain.  There is no significant lymphadenopathy.  No masses palpable.  Thyroid  bed within normal limits to palpation.  Parotid glands and submandibular glands equal bilaterally without mass.  Trachea is midline. Neuro:  CN 2-12 grossly intact. Vestibular: No nystagmus at any point of gaze. Dix Hallpike negative. Vestibular: There is no nystagmus with pneumatic pressure on either tympanic membrane or Valsalva. The cerebellar examination is unremarkable.   Procedure:  Endoscopic control of recurrent left epistaxis. Indication:  Recurrent epistaxis   Description:  The left nasal cavity is sprayed with topical xylocaine  and neo-synephrine.  After adequate anesthesia is achieved, the nasal cavity is examined with a 0 rigid endoscope.  A suction catheter is inserted in parallel with the 0 endoscope, and it is used to suction blood clots from the nasal cavity.  Several hypervascular areas are noted on the anterior and superior portion of the septum. Active bleeding is noted. A silver nitrate stick is inserted in parallel with the 0 endoscope.  It is used to repeatedly cauterized the hypervascular areas.  Good hemostasis is achieved.  The patient tolerated the procedure well.   Assessment & Plan Recurrent left-sided epistaxis Chronic left-sided epistaxis. Examination revealed hypervascular areas on the left anterior and superior nasal septum.  Active bleeding is noted today. - The physical exam and nasal endoscopy findings are reviewed with  the patient. -Endoscopic cauterization of the left nasal septum. - Advised to maintain nasal mucosal moisture with gentle application of Vaseline using a cotton-tipped applicator. - Instructed to avoid digital manipulation and to blow his nose gently. - Use a humidifier at home.   - Scheduled follow-up in one month to reassess nasal mucosa and healing.  Recurrent dizziness, impaired balance Chronic, non-vertiginous dizziness and impaired balance with intermittent falls, likely multifactorial and age-related. No associated vertigo, auditory symptoms, or headaches.  - The pathophysiology of vestibular dysfunction and dizziness are discussed extensively with the patient. The possible differential diagnoses are reviewed. Questions are invited and answered.   - Referred to physical therapy for balance training and exercises, arranged locally for convenience. - Provided education on the importance of balance exercises and anticipated improvement with therapy.  Progressive bilateral sensorineural hearing loss. -  The option of hearing amplification is discussed.  Taeden Geller W Dezire Turk 11/23/2024, 3:35 PM      [1]  Allergies Allergen Reactions   Prednisone     Patient state that this medication made his blood sugar go real high

## 2024-11-24 ENCOUNTER — Ambulatory Visit

## 2024-11-30 ENCOUNTER — Ambulatory Visit: Attending: Cardiology | Admitting: *Deleted

## 2024-11-30 DIAGNOSIS — I4891 Unspecified atrial fibrillation: Secondary | ICD-10-CM | POA: Insufficient documentation

## 2024-11-30 DIAGNOSIS — Z5181 Encounter for therapeutic drug level monitoring: Secondary | ICD-10-CM | POA: Insufficient documentation

## 2024-11-30 LAB — POCT INR: INR: 1.3 — AB (ref 2.0–3.0)

## 2024-11-30 NOTE — Progress Notes (Signed)
 INR 1.3 Please see anticoagulation encounter

## 2024-11-30 NOTE — Patient Instructions (Signed)
 Hold warfarin again tonight.  Resume warfarin 1 tablet daily tomorrow night after Carpel Tunnel surgery if OK with Surgeon. Recheck in 10 days Pending Carpel Tunnel surgery on 12/01/24.

## 2024-12-09 ENCOUNTER — Ambulatory Visit: Attending: Cardiology | Admitting: *Deleted

## 2024-12-09 DIAGNOSIS — I4891 Unspecified atrial fibrillation: Secondary | ICD-10-CM | POA: Insufficient documentation

## 2024-12-09 DIAGNOSIS — Z5181 Encounter for therapeutic drug level monitoring: Secondary | ICD-10-CM | POA: Diagnosis present

## 2024-12-09 LAB — POCT INR: INR: 1.5 — AB (ref 2.0–3.0)

## 2024-12-09 NOTE — Patient Instructions (Signed)
 Take warfarin 1 1/2 tablets tonight and tomorrow night then resume 1 tablet daily  Recheck in 10 days

## 2024-12-09 NOTE — Progress Notes (Signed)
 INR 1.5

## 2024-12-11 NOTE — Progress Notes (Signed)
 [name] MRN: [id number] DOB: 02-25-1941 (age: 84 y.o.)  HPI: Is in clinic today for follow up s/p right carpal tunnel release on 12/01/2024 by Dr. Delene.  He said he is doing well without pain or discomfort.  He states that his nocturnal symptoms have resolved and his preoperative numbness and tingling is significantly improved.  He has numbness in the tip of the thumb but it is significantly improved compared to preop.  He is very pleased with his surgical outcomes.  Physical Exam: On exam this is a well-nourished well-developed individual in no acute distress.  They are alert and oriented x3.  They are cooperative with the exam.  Right upper Extremity Exam: Overlying skin is warm dry and intact. Incision is healing well without signs of infection, irritation, drainage. Capillary refill is brisk and skin turgor is appropriate. SILT in the ulnar, radial, and median distributions.  Can make a composite fist when compared bilaterally.  Fingers are warm and pink.    Impression/plan: 1.  Status post right carpal tunnel release: Healing well - No soaking / submerging until the incision is completely healed- avoid dish water , bath water , pool water , lake water   - Can shower and perform hand hygiene normally  - Can perform day to day activities but should avoid  heavy lifting, weight bearing, grasping, pushing, pulling until the incision is completely healed  Follow up in 4 weeks with Dr. Delene.   Isaiah Anton, PA-C

## 2024-12-16 ENCOUNTER — Ambulatory Visit (HOSPITAL_COMMUNITY)

## 2024-12-16 ENCOUNTER — Telehealth (HOSPITAL_COMMUNITY): Payer: Self-pay

## 2024-12-16 NOTE — Therapy (Incomplete)
 " OUTPATIENT PHYSICAL THERAPY VESTIBULAR EVALUATION     Patient Name: [name] MRN: [id number] DOB:[birth year], 84 y.o., male Today's Date: 12/16/2024  END OF SESSION:   Past Medical History:  Diagnosis Date   Aortic atherosclerosis    Barrett's esophagus    Short-segment, diagnosed in 2007 by Dr. Shaaron   BPH (benign prostatic hyperplasia)    CAD (coronary artery disease)    a. cath on 07/21/2019 showing patent LM and LCx with 40-60% Proximal LAD and 30-40% Pro-RCA stenosis with normal LV function.   Essential hypertension    GERD (gastroesophageal reflux disease)    Hiatal hernia    History of kidney stones    PAF (paroxysmal atrial fibrillation) (HCC)    S/P colonoscopy 2007   S/P endoscopy 08/2010    Salmon-colored epithelium coming up to 37 cm from the    Type 2 diabetes mellitus (HCC)    Vertigo    Past Surgical History:  Procedure Laterality Date   APPENDECTOMY     BIOPSY  01/28/2020   Procedure: BIOPSY;  Surgeon: Shaaron Lamar HERO, MD;  Location: [facility];  Service: Endoscopy;;  esophagus   CHOLECYSTECTOMY     COLONOSCOPY  02/06/2006   Dr. Shaaron- single anal papilla, o/w normal rectum, normal colon   COLONOSCOPY N/A 09/13/2016   RMR: three 5-6 mm for adenomas removed.  Recommend one last surveillance colonoscopy in October 2020.   COLONOSCOPY WITH PROPOFOL  N/A 01/28/2020   Procedure: COLONOSCOPY WITH PROPOFOL ;  Surgeon: Shaaron Lamar HERO, MD;  Location: [facility];  Service: Endoscopy;  Laterality: N/A;  10:45am   CORONARY PRESSURE/FFR STUDY N/A 07/21/2019   Procedure: INTRAVASCULAR PRESSURE WIRE/FFR STUDY;  Surgeon: Claudene Victory ORN, MD;  Location: [facility];  Service: Cardiovascular;  Laterality: N/A;   CYSTOSCOPY WITH RETROGRADE PYELOGRAM, URETEROSCOPY AND STENT PLACEMENT Right 08/18/2020   Procedure: CYSTOSCOPY WITH INCISION OF RIGHT URETEROCELE, URETEROSCOPY WITH HOLMIUM LASER, CYSTOLITHOLAPAXY OF 10 MM BLADDER STONE;  Surgeon: Watt Rush, MD;   Location: [facility];  Service: Urology;  Laterality: Right;   ESOPHAGOGASTRODUODENOSCOPY  09/15/2010   Dr. Shaaron- Salmon-colored epithelium coming up to 37 cm from the    ESOPHAGOGASTRODUODENOSCOPY N/A 07/02/2013   Dr. Shaaron- Barretts on bx. hiatal hernia   ESOPHAGOGASTRODUODENOSCOPY N/A 09/13/2016   RMR: Barrett's esophagus without dysplasia. one last EGD in 3 years.    ESOPHAGOGASTRODUODENOSCOPY (EGD) WITH PROPOFOL  N/A 01/28/2020   Procedure: ESOPHAGOGASTRODUODENOSCOPY (EGD) WITH PROPOFOL ;  Surgeon: Shaaron Lamar HERO, MD;  Location: [facility];  Service: Endoscopy;  Laterality: N/A;   LEFT HEART CATH AND CORONARY ANGIOGRAPHY N/A 07/21/2019   Procedure: LEFT HEART CATH AND CORONARY ANGIOGRAPHY;  Surgeon: Claudene Victory ORN, MD;  Location: [facility];  Service: Cardiovascular;  Laterality: N/A;   POLYPECTOMY  01/28/2020   Procedure: POLYPECTOMY;  Surgeon: Shaaron Lamar HERO, MD;  Location: [facility];  Service: Endoscopy;;  colon   RIGHT/LEFT HEART CATH AND CORONARY ANGIOGRAPHY N/A 05/15/2024   Procedure: RIGHT/LEFT HEART CATH AND CORONARY ANGIOGRAPHY;  Surgeon: Anner Alm ORN, MD;  [facility];  Service: Cardiovascular;  Laterality: N/A;   ROTATOR CUFF REPAIR     YAG LASER APPLICATION Right 12/20/2014   Procedure: YAG LASER APPLICATION;  Surgeon: Dow JULIANNA Burke, MD;  Location: [facility];  Service: Ophthalmology;  Laterality: Right;   Patient Active Problem List   Diagnosis Date Noted   Sensorineural hearing loss, bilateral 11/23/2024   Epistaxis 11/23/2024   Hyperglycemia due to  type 2 diabetes mellitus (HCC) 07/29/2024   Mixed hyperlipidemia 07/29/2024   Osteoarthritis of carpometacarpal (CMC) joint of right thumb 07/16/2024   Benign prostatic hyperplasia without lower urinary tract symptoms 07/14/2024   Bilateral carpal tunnel syndrome 07/14/2024   Excessive daytime sleepiness 07/17/2023   Chronic cough 05/14/2023   Pneumonia 05/03/2023   Sepsis due to pneumonia (HCC)  05/03/2023   Subdural hematoma (HCC) 04/01/2022   DOE (dyspnea on exertion) 06/28/2020   Long-term current use of opiate analgesic 02/10/2020   Arthritis of right sacroiliac joint 02/08/2020   Lumbar radiculopathy 02/08/2020   Polyneuropathy due to type 2 diabetes mellitus (HCC) 02/08/2020   Restless legs 02/08/2020   Chronic low back pain 02/08/2020   Polyarthropathy 02/08/2020   Fatigue 10/19/2019   H/O adenomatous polyp of colon 10/19/2019   Abnormal cardiac CT angiography    Encounter for therapeutic drug monitoring 07/02/2018   Atrial fibrillation (HCC) 06/24/2018   Dizziness 05/09/2010   Angina pectoris 05/09/2010   GERD 01/31/2009   BARRETTS ESOPHAGUS 01/31/2009    PCP: Shona Norleen PEDLAR, MD REFERRING PROVIDER: Karis Clunes, MD  REFERRING DIAG:  Diagnosis  R42 (ICD-10-CM) - Dizziness    THERAPY DIAG:  No diagnosis found.  ONSET DATE: ***  Rationale for Evaluation and Treatment: Rehabilitation  SUBJECTIVE:   SUBJECTIVE STATEMENT: *** Pt accompanied by: {accompnied:27141}  PERTINENT HISTORY: s/p CPT on 12/01/24  PAIN:  Are you having pain? {OPRCPAIN:27236}  PRECAUTIONS: {Therapy precautions:24002}  RED FLAGS: {PT Red Flags:29287}   WEIGHT BEARING RESTRICTIONS: {Yes ***/No:24003}  FALLS: Has patient fallen in last 6 months? {fallsyesno:27318}  LIVING ENVIRONMENT: Lives with: {OPRC lives with:25569::lives with their family} Lives in: {Lives in:25570} Stairs: {opstairs:27293} Has following equipment at home: {Assistive devices:23999}  PLOF: {PLOF:24004}  PATIENT GOALS: ***  OBJECTIVE:  Note: Objective measures were completed at Evaluation unless otherwise noted.  DIAGNOSTIC FINDINGS: ***  COGNITION: Overall cognitive status: {cognition:24006}   SENSATION: {sensation:27233}  EDEMA:  {edema:24020}  MUSCLE TONE:  {LE tone:25568}  DTRs:  {DTR SITE:24025}  POSTURE:  {posture:25561}  Cervical ROM:    Active A/PROM (deg) eval  Flexion    Extension   Right lateral flexion   Left lateral flexion   Right rotation   Left rotation   (Blank rows = not tested)  STRENGTH: ***  LOWER EXTREMITY MMT:   MMT Right eval Left eval  Hip flexion    Hip abduction    Hip adduction    Hip internal rotation    Hip external rotation    Knee flexion    Knee extension    Ankle dorsiflexion    Ankle plantarflexion    Ankle inversion    Ankle eversion    (Blank rows = not tested)  BED MOBILITY:  {Bed mobility:24027}  TRANSFERS: Assistive device utilized: {Assistive devices:23999}  Sit to stand: {Levels of assistance:24026} Stand to sit: {Levels of assistance:24026} Chair to chair: {Levels of assistance:24026} Floor: {Levels of assistance:24026}  RAMP: {Levels of assistance:24026}  CURB: {Levels of assistance:24026}  GAIT: Gait pattern: {gait characteristics:25376} Distance walked: *** Assistive device utilized: {Assistive devices:23999} Level of assistance: {Levels of assistance:24026} Comments: ***  FUNCTIONAL TESTS:  {Functional tests:24029}  PATIENT SURVEYS:  {rehab surveys:24030}  VESTIBULAR ASSESSMENT:  GENERAL OBSERVATION: ***   SYMPTOM BEHAVIOR:  Subjective history: see above  Non-Vestibular symptoms: {nonvestibular symptoms:25260}  Type of dizziness: {Type of Dizziness:25255}  Frequency: ***  Duration: ***  Aggravating factors: {Aggravating Factors:25258}  Relieving factors: {Relieving Factors:25259}  Progression of symptoms: {DESC; BETTER/WORSE:18575}  OCULOMOTOR  EXAM:  Ocular Alignment: {Ocular Alignment:25262}  Ocular ROM: {RANGE OF MOTION:21649}  Spontaneous Nystagmus: {Spontaneous nystagmus:25263}  Gaze-Induced Nystagmus: {gaze-induced nystagmus:25264}  Smooth Pursuits: {smooth pursuit:25265}  Saccades: {saccades:25266}  Convergence/Divergence: *** cm   Cover-cross-cover test: {cover test:33756}  VESTIBULAR - OCULAR REFLEX:   Slow VOR: {slow VOR:25290}  VOR Cancellation: {vor  cancellation:25291}  Head-Impulse Test: {head impulse test:25272}  Dynamic Visual Acuity: {dynamic visual acuity:25273}   POSITIONAL TESTING: {Positional tests:25271}  MOTION SENSITIVITY:  Motion Sensitivity Quotient Intensity: 0 = none, 1 = Lightheaded, 2 = Mild, 3 = Moderate, 4 = Severe, 5 = Vomiting  Intensity  1. Sitting to supine   2. Supine to L side   3. Supine to R side   4. Supine to sitting   5. L Hallpike-Dix   6. Up from L    7. R Hallpike-Dix   8. Up from R    9. Sitting, head tipped to L knee   10. Head up from L knee   11. Sitting, head tipped to R knee   12. Head up from R knee   13. Sitting head turns x5   14.Sitting head nods x5   15. In stance, 180 turn to L    16. In stance, 180 turn to R     OTHOSTATICS: {Exam; orthostatics:31331}  FUNCTIONAL GAIT: {Functional tests:24029}                                                                                                                             TREATMENT DATE: 12/16/2024   Canalith Repositioning:  {Canalith Repositioning:25283} Gaze Adaptation:  {gaze adaptation:25286} Habituation:  {habituation:25288} Other: ***  PATIENT EDUCATION: Education details: Patient educated on exam findings, POC, scope of PT, HEP, and ***. Person educated: Patient Education method: Explanation, Demonstration, and Handouts Education comprehension: verbalized understanding, returned demonstration, verbal cues required, and tactile cues required HOME EXERCISE PROGRAM:  GOALS: Goals reviewed with patient? No  SHORT TERM GOALS: Target date: ***  *** Baseline: Goal status: {GOALSTATUS:25110}  2.  *** Baseline:  Goal status: {GOALSTATUS:25110}  3.  *** Baseline:  Goal status: {GOALSTATUS:25110}  4.  *** Baseline:  Goal status: {GOALSTATUS:25110}  5.  *** Baseline:  Goal status: {GOALSTATUS:25110}  6.  *** Baseline:  Goal status: {GOALSTATUS:25110}  LONG TERM GOALS: Target date:  ***  *** Baseline:  Goal status: {GOALSTATUS:25110}  2.  *** Baseline:  Goal status: {GOALSTATUS:25110}  3.  *** Baseline:  Goal status: {GOALSTATUS:25110}  4.  *** Baseline:  Goal status: {GOALSTATUS:25110}  5.  *** Baseline:  Goal status: {GOALSTATUS:25110}  6.  *** Baseline:  Goal status: {GOALSTATUS:25110}  ASSESSMENT:  CLINICAL IMPRESSION: Patient is a 84 y.o. male who was seen today for physical therapy evaluation and treatment for  Diagnosis  R42 (ICD-10-CM) - Dizziness  .   OBJECTIVE IMPAIRMENTS: {opptimpairments:25111}.   ACTIVITY LIMITATIONS: {activitylimitations:27494}  PARTICIPATION LIMITATIONS: {participationrestrictions:25113}  PERSONAL FACTORS: {Personal factors:25162} are also affecting patient's functional outcome.  REHAB POTENTIAL: Good  CLINICAL DECISION MAKING: Evolving/moderate complexity  EVALUATION COMPLEXITY: Moderate   PLAN:  PT FREQUENCY: {rehab frequency:25116}  PT DURATION: {rehab duration:25117}  PLANNED INTERVENTIONS: 97164- PT Re-evaluation, 97110-Therapeutic exercises, 97530- Therapeutic activity, 97112- Neuromuscular re-education, 97535- Self Care, 02859- Manual therapy, [id number]- Gait training, [phone]- Orthotic Fit/training, [phone]- Canalith repositioning, [id number]- Aquatic Therapy, 97760- Splinting, [id number]- Wound care (first 20 sq cm), 97598- Wound care (each additional 20 sq cm)Patient/Family education, Balance training, Stair training, Taping, Dry Needling, Joint mobilization, Joint manipulation, Spinal manipulation, Spinal mobilization, Scar mobilization, and DME instructions.   PLAN FOR NEXT SESSION: Review HEP and goals;    7:36 AM, 12/16/24 Zacarias Krauter Small Antoninette Lerner MPT [facility] [place] [phone] Ph:[phone]  "

## 2024-12-16 NOTE — Telephone Encounter (Signed)
 Spoke with patient; he is unable to get out of his driveway due to ice/snow.  Does not feel he needs therapy at this time.   3:33 PM, 12/16/24 Alajia Schmelzer Small Kay Ricciuti MPT [facility] [place] [phone]

## 2024-12-18 ENCOUNTER — Ambulatory Visit: Admitting: Urology

## 2024-12-18 ENCOUNTER — Other Ambulatory Visit: Payer: Self-pay | Admitting: Cardiology

## 2024-12-21 ENCOUNTER — Ambulatory Visit

## 2024-12-24 ENCOUNTER — Other Ambulatory Visit: Payer: Self-pay | Admitting: Orthopedic Surgery

## 2024-12-24 ENCOUNTER — Other Ambulatory Visit: Payer: Self-pay

## 2024-12-24 ENCOUNTER — Ambulatory Visit: Admitting: Orthopedic Surgery

## 2024-12-24 VITALS — Ht 71.0 in | Wt 219.0 lb

## 2024-12-24 DIAGNOSIS — M25562 Pain in left knee: Secondary | ICD-10-CM

## 2024-12-24 DIAGNOSIS — M7052 Other bursitis of knee, left knee: Secondary | ICD-10-CM

## 2024-12-24 MED ORDER — METHYLPREDNISOLONE ACETATE 40 MG/ML IJ SUSP
40.0000 mg | Freq: Once | INTRAMUSCULAR | Status: AC
  Administered 2024-12-24: 40 mg via INTRA_ARTICULAR

## 2024-12-24 NOTE — Progress Notes (Signed)
" ° °  Patient: [name]           Date of Birth: May 05, 1941           MRN: [id number] Visit Date: 12/24/2024 Requested by: Shona Norleen PEDLAR, MD [address] [place],  [place] [address] PCP: Shona Norleen PEDLAR, MD  Encounter Diagnoses  Name Primary?   Acute pain of left knee    Pes anserinus bursitis of left knee Yes    Assessment and plan:  Encounter Diagnoses  Name Primary?   Acute pain of left knee    Pes anserinus bursitis of left knee Yes    Pes bursa and tendon injection  Continue ice  Continue topical medication  If no improvement after 2 weeks come back for repeat injection     Procedure note Left knee injection for bursitis   verbal consent was obtained to inject Left knee PES BURSA  Timeout was completed to confirm the site of injection  The medications used were 40 mg of Depo-Medrol  and 1% lidocaine  3 cc  Anesthesia was provided by ethyl chloride and the skin was prepped with alcohol.  After cleaning the skin with alcohol a 25-gauge needle was used to inject the left knee bursa   There were no complications and a sterile bandage was applied    Meds ordered this encounter  Medications   methylPREDNISolone  acetate (DEPO-MEDROL ) injection 40 mg     Chief Complaint  Patient presents with   Knee Pain    L for 2 wks, sore to touch, has been taking tramadol , and topical rubs that helps.    History:  84 year old male history of osteoarthritis related insufficiency fracture right knee presents with acute medial knee pain no trauma  Focused exam findings:  Mild medial joint line tenderness more significant tenderness over the pes bursa Knee range of motion normal no effusion  Imaging was obtained  DG Knee AP/LAT W/Sunrise Left Result Date: 12/24/2024 Imaging left knee Medial left knee pain No injury Overall normal alignment to the left knee no joint space narrowing, no osteophytes, no fracture, reasonable bone quality Normal x-ray left knee      "

## 2024-12-24 NOTE — Progress Notes (Signed)
" °  Intake history:  Chief Complaint  Patient presents with   Knee Pain    L for 2 wks, sore to touch, has been taking tramadol , and topical rubs that helps.     Ht 5' 11 (1.803 m)   Wt 219 lb (99.3 kg)   BMI 30.54 kg/m  Body mass index is 30.54 kg/m.  Pharmacy? [facility]  WHAT ARE WE SEEING YOU FOR TODAY?   left knee(s)  How long has this bothered you? (DOI?DOS?WS?)  approximately 2 week(s) ago  Was there an injury? No  Anticoag.  Yes   Any ALLERGIES  Prednisone  Treatment:  Have you taken:  Tylenol  No  Advil No  Had PT No  Had injection No  Other  _________________________     "

## 2024-12-28 ENCOUNTER — Ambulatory Visit (INDEPENDENT_AMBULATORY_CARE_PROVIDER_SITE_OTHER): Admitting: Otolaryngology

## 2024-12-29 ENCOUNTER — Ambulatory Visit

## 2025-01-21 ENCOUNTER — Ambulatory Visit: Admitting: Nurse Practitioner

## 2025-05-13 ENCOUNTER — Other Ambulatory Visit
